# Patient Record
Sex: Female | Born: 1985
Health system: Southern US, Community
[De-identification: ages and names within clinical notes are randomized; demographics above are authoritative.]

## PROBLEM LIST (undated history)

## (undated) ENCOUNTER — Inpatient Hospital Stay (HOSPITAL_COMMUNITY): Payer: Self-pay

## (undated) DIAGNOSIS — E0590 Thyrotoxicosis, unspecified without thyrotoxic crisis or storm: Secondary | ICD-10-CM

## (undated) DIAGNOSIS — R0602 Shortness of breath: Secondary | ICD-10-CM

## (undated) DIAGNOSIS — D649 Anemia, unspecified: Secondary | ICD-10-CM

## (undated) DIAGNOSIS — G4730 Sleep apnea, unspecified: Secondary | ICD-10-CM

## (undated) DIAGNOSIS — T4145XA Adverse effect of unspecified anesthetic, initial encounter: Secondary | ICD-10-CM

## (undated) DIAGNOSIS — O139 Gestational [pregnancy-induced] hypertension without significant proteinuria, unspecified trimester: Secondary | ICD-10-CM

## (undated) DIAGNOSIS — K219 Gastro-esophageal reflux disease without esophagitis: Secondary | ICD-10-CM

## (undated) DIAGNOSIS — E079 Disorder of thyroid, unspecified: Secondary | ICD-10-CM

## (undated) DIAGNOSIS — Z9889 Other specified postprocedural states: Secondary | ICD-10-CM

## (undated) DIAGNOSIS — R519 Headache, unspecified: Secondary | ICD-10-CM

## (undated) DIAGNOSIS — T8859XA Other complications of anesthesia, initial encounter: Secondary | ICD-10-CM

## (undated) DIAGNOSIS — E559 Vitamin D deficiency, unspecified: Secondary | ICD-10-CM

## (undated) DIAGNOSIS — R51 Headache: Secondary | ICD-10-CM

## (undated) DIAGNOSIS — R112 Nausea with vomiting, unspecified: Secondary | ICD-10-CM

## (undated) DIAGNOSIS — I2699 Other pulmonary embolism without acute cor pulmonale: Secondary | ICD-10-CM

## (undated) HISTORY — DX: Sleep apnea, unspecified: G47.30

## (undated) HISTORY — DX: Shortness of breath: R06.02

## (undated) HISTORY — DX: Vitamin D deficiency, unspecified: E55.9

## (undated) HISTORY — DX: Disorder of thyroid, unspecified: E07.9

## (undated) HISTORY — PX: FRACTURE SURGERY: SHX138

---

## 2005-01-06 ENCOUNTER — Emergency Department (HOSPITAL_COMMUNITY): Admission: EM | Admit: 2005-01-06 | Discharge: 2005-01-06 | Payer: Self-pay | Admitting: Emergency Medicine

## 2005-06-01 ENCOUNTER — Other Ambulatory Visit: Admission: RE | Admit: 2005-06-01 | Discharge: 2005-06-01 | Payer: Self-pay | Admitting: Obstetrics and Gynecology

## 2005-12-12 ENCOUNTER — Emergency Department (HOSPITAL_COMMUNITY): Admission: EM | Admit: 2005-12-12 | Discharge: 2005-12-12 | Payer: Self-pay | Admitting: Emergency Medicine

## 2005-12-14 ENCOUNTER — Emergency Department (HOSPITAL_COMMUNITY): Admission: EM | Admit: 2005-12-14 | Discharge: 2005-12-15 | Payer: Self-pay | Admitting: Emergency Medicine

## 2005-12-21 ENCOUNTER — Emergency Department (HOSPITAL_COMMUNITY): Admission: EM | Admit: 2005-12-21 | Discharge: 2005-12-21 | Payer: Self-pay | Admitting: Emergency Medicine

## 2006-03-19 ENCOUNTER — Ambulatory Visit: Payer: Self-pay | Admitting: Obstetrics and Gynecology

## 2006-03-19 ENCOUNTER — Inpatient Hospital Stay (HOSPITAL_COMMUNITY): Admission: AD | Admit: 2006-03-19 | Discharge: 2006-03-19 | Payer: Self-pay | Admitting: Family Medicine

## 2006-03-20 ENCOUNTER — Ambulatory Visit (HOSPITAL_COMMUNITY): Admission: RE | Admit: 2006-03-20 | Discharge: 2006-03-20 | Payer: Self-pay | Admitting: Obstetrics & Gynecology

## 2006-05-12 ENCOUNTER — Inpatient Hospital Stay (HOSPITAL_COMMUNITY): Admission: AD | Admit: 2006-05-12 | Discharge: 2006-05-12 | Payer: Self-pay | Admitting: Family Medicine

## 2006-05-12 ENCOUNTER — Ambulatory Visit: Payer: Self-pay | Admitting: Physician Assistant

## 2006-06-20 ENCOUNTER — Inpatient Hospital Stay (HOSPITAL_COMMUNITY): Admission: AD | Admit: 2006-06-20 | Discharge: 2006-06-26 | Payer: Self-pay | Admitting: Obstetrics & Gynecology

## 2006-06-20 ENCOUNTER — Ambulatory Visit: Payer: Self-pay | Admitting: Gynecology

## 2006-06-21 ENCOUNTER — Encounter: Payer: Self-pay | Admitting: Obstetrics & Gynecology

## 2006-11-12 ENCOUNTER — Emergency Department (HOSPITAL_COMMUNITY): Admission: EM | Admit: 2006-11-12 | Discharge: 2006-11-12 | Payer: Self-pay | Admitting: Emergency Medicine

## 2006-11-30 ENCOUNTER — Emergency Department (HOSPITAL_COMMUNITY): Admission: EM | Admit: 2006-11-30 | Discharge: 2006-12-01 | Payer: Self-pay | Admitting: Emergency Medicine

## 2007-06-01 ENCOUNTER — Emergency Department (HOSPITAL_COMMUNITY): Admission: EM | Admit: 2007-06-01 | Discharge: 2007-06-01 | Payer: Self-pay | Admitting: Emergency Medicine

## 2007-07-17 ENCOUNTER — Emergency Department (HOSPITAL_COMMUNITY): Admission: EM | Admit: 2007-07-17 | Discharge: 2007-07-17 | Payer: Self-pay | Admitting: Emergency Medicine

## 2007-10-15 ENCOUNTER — Emergency Department (HOSPITAL_COMMUNITY): Admission: EM | Admit: 2007-10-15 | Discharge: 2007-10-16 | Payer: Self-pay | Admitting: Family Medicine

## 2007-12-28 ENCOUNTER — Emergency Department (HOSPITAL_COMMUNITY): Admission: EM | Admit: 2007-12-28 | Discharge: 2007-12-28 | Payer: Self-pay | Admitting: Family Medicine

## 2007-12-30 ENCOUNTER — Emergency Department (HOSPITAL_COMMUNITY): Admission: EM | Admit: 2007-12-30 | Discharge: 2007-12-30 | Payer: Self-pay | Admitting: Emergency Medicine

## 2008-02-10 ENCOUNTER — Emergency Department (HOSPITAL_COMMUNITY): Admission: EM | Admit: 2008-02-10 | Discharge: 2008-02-10 | Payer: Self-pay | Admitting: Emergency Medicine

## 2008-03-01 ENCOUNTER — Emergency Department (HOSPITAL_COMMUNITY): Admission: EM | Admit: 2008-03-01 | Discharge: 2008-03-01 | Payer: Self-pay | Admitting: Emergency Medicine

## 2008-05-02 ENCOUNTER — Emergency Department (HOSPITAL_COMMUNITY): Admission: EM | Admit: 2008-05-02 | Discharge: 2008-05-02 | Payer: Self-pay | Admitting: Emergency Medicine

## 2008-06-17 ENCOUNTER — Emergency Department (HOSPITAL_COMMUNITY): Admission: EM | Admit: 2008-06-17 | Discharge: 2008-06-17 | Payer: Self-pay | Admitting: Emergency Medicine

## 2008-07-15 ENCOUNTER — Emergency Department (HOSPITAL_COMMUNITY): Admission: EM | Admit: 2008-07-15 | Discharge: 2008-07-15 | Payer: Self-pay | Admitting: Family Medicine

## 2008-08-25 ENCOUNTER — Emergency Department (HOSPITAL_COMMUNITY): Admission: EM | Admit: 2008-08-25 | Discharge: 2008-08-25 | Payer: Self-pay | Admitting: Family Medicine

## 2008-11-10 ENCOUNTER — Emergency Department (HOSPITAL_COMMUNITY): Admission: EM | Admit: 2008-11-10 | Discharge: 2008-11-10 | Payer: Self-pay | Admitting: Family Medicine

## 2008-11-21 ENCOUNTER — Emergency Department (HOSPITAL_COMMUNITY): Admission: EM | Admit: 2008-11-21 | Discharge: 2008-11-21 | Payer: Self-pay | Admitting: Emergency Medicine

## 2009-02-14 IMAGING — US US OB FOLLOW-UP
1 series · 14 of 21 positions shown · non-contrast
Comparison: none

OBSTETRICAL ULTRASOUND:

 This ultrasound exam was performed in the [HOSPITAL] Ultrasound Department.  The OB US report was generated in the AS system, and faxed to the ordering physician.  This report is also available in [REDACTED] PACS.

[Series 1: us ob re-eval · 14 of 21 slices shown]
[im 1/21]
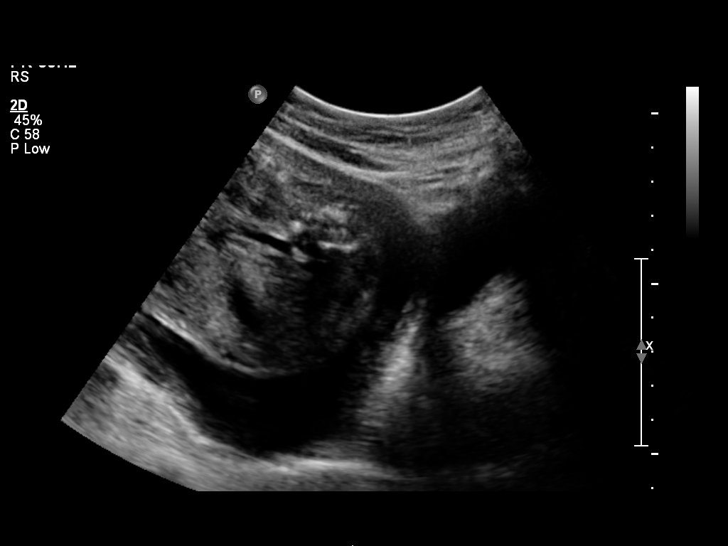
[im 3/21]
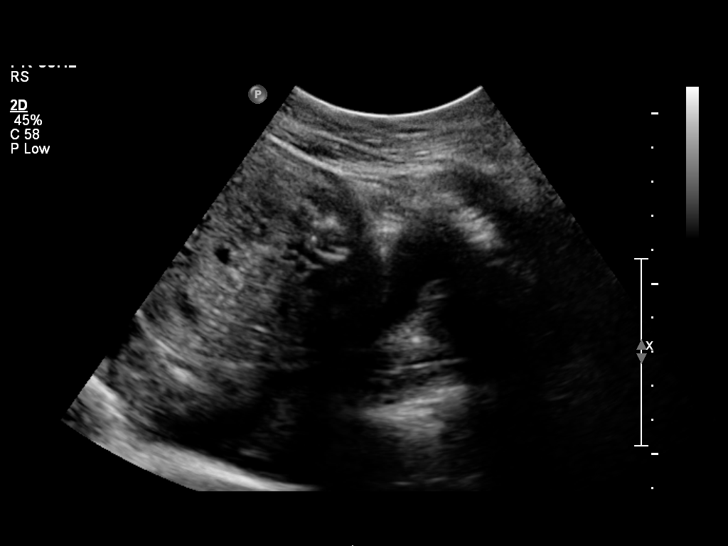
[im 4/21]
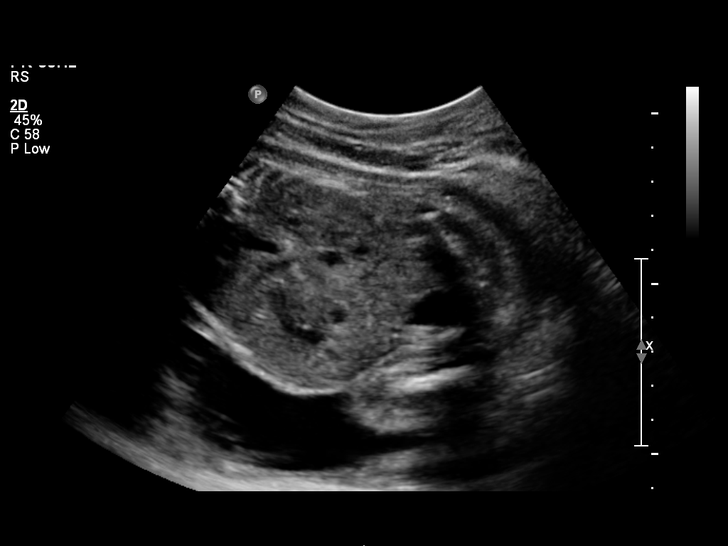
[im 6/21]
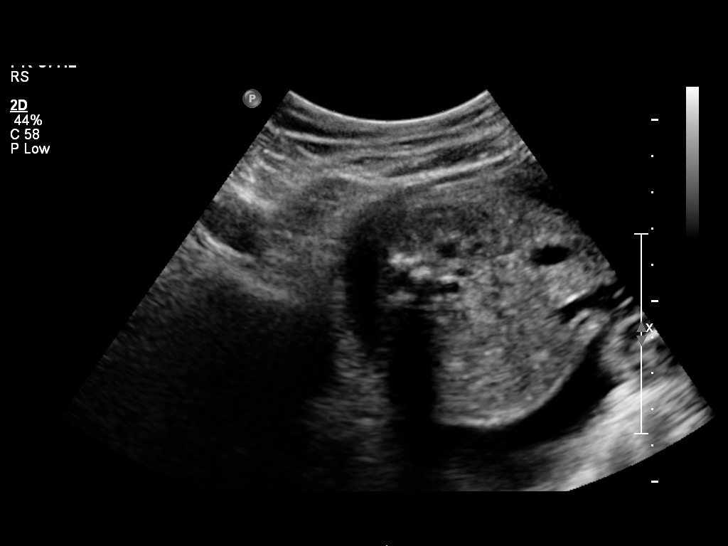
[im 7/21]
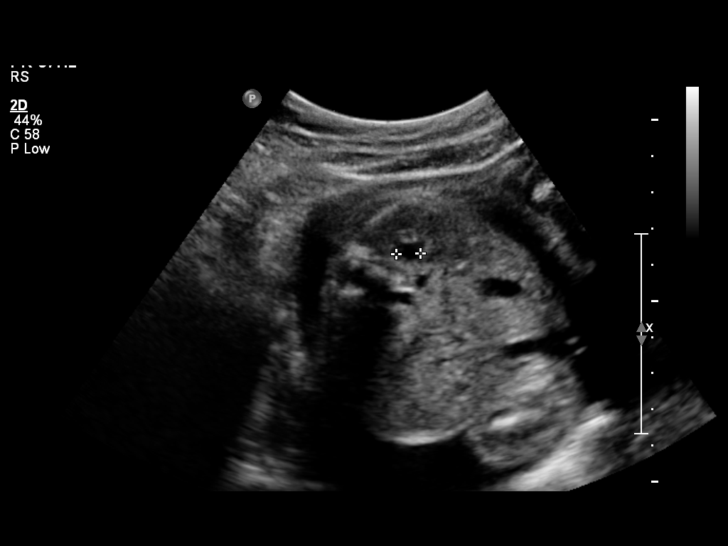
[im 9/21]
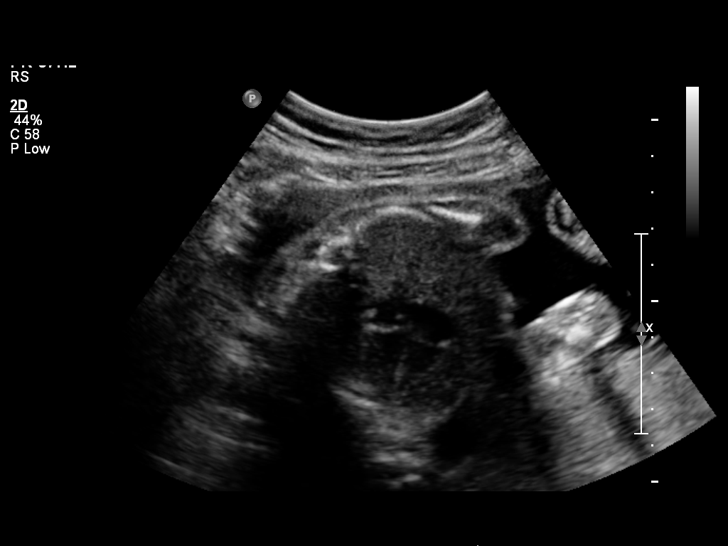
[im 10/21]
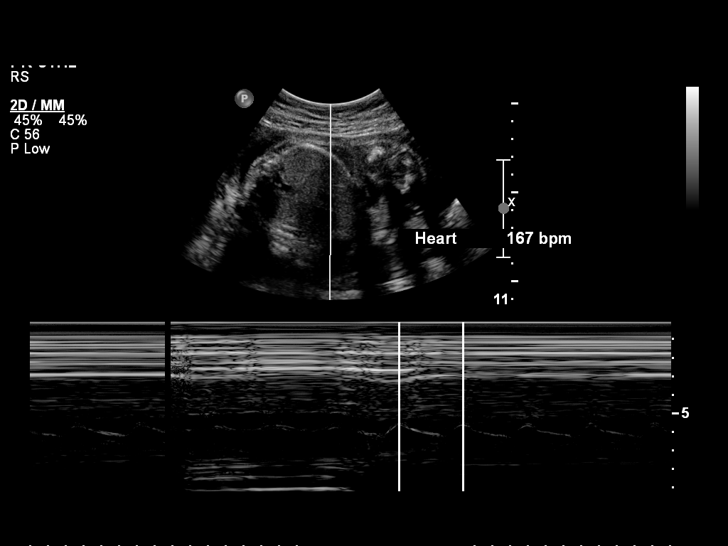
[im 12/21]
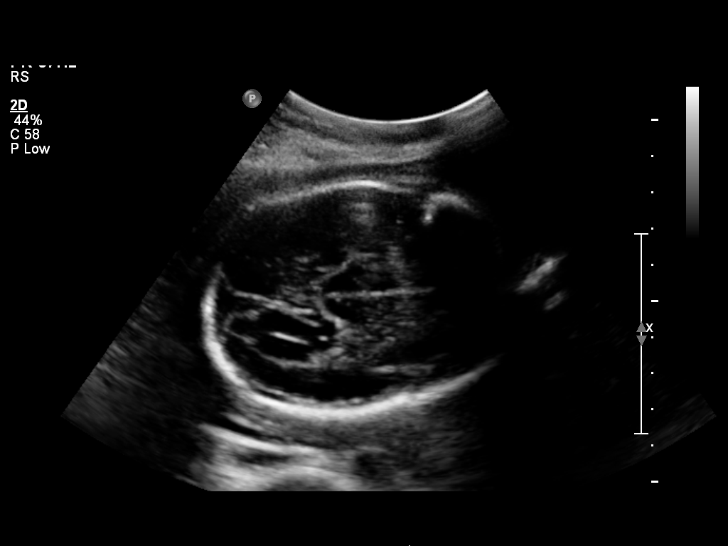
[im 13/21]
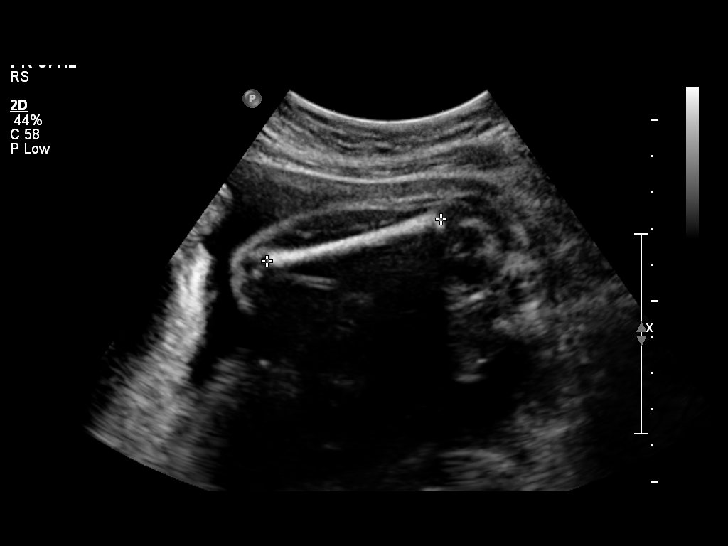
[im 15/21]
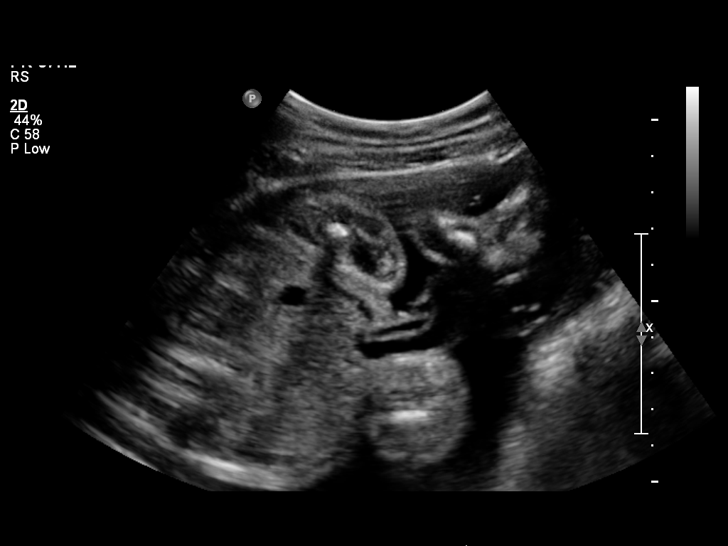
[im 16/21]
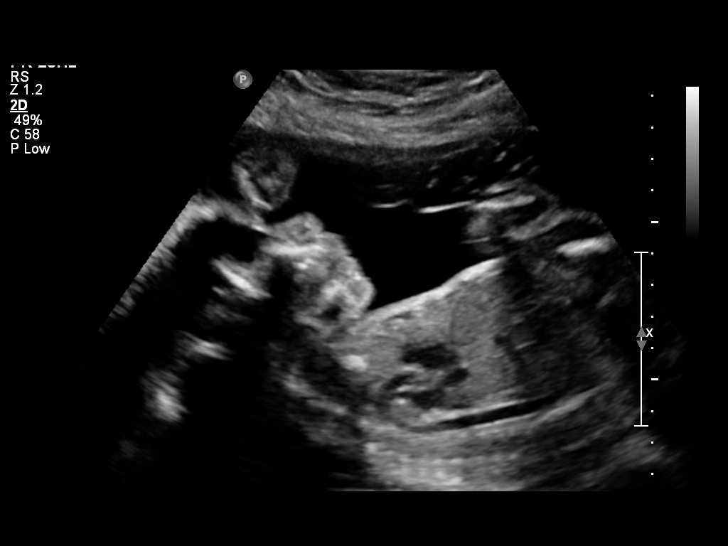
[im 18/21]
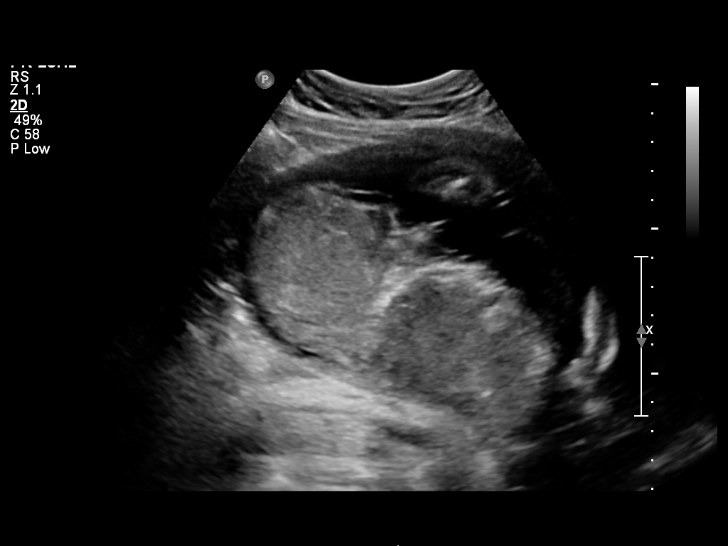
[im 19/21]
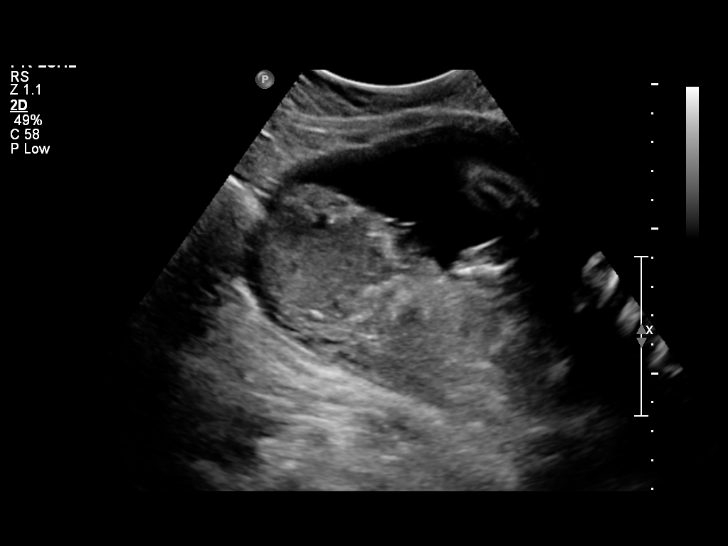
[im 21/21]
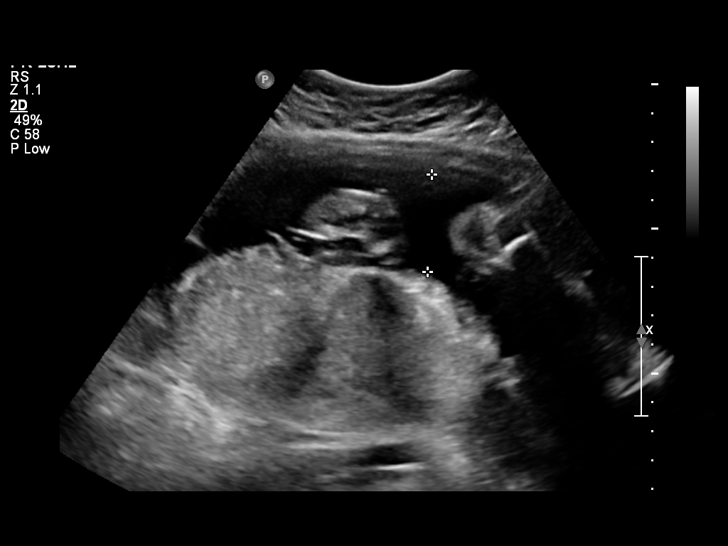

[14 of 21 positions shown; findings below may reference images not displayed]

IMPRESSION: See AS Obstetric US report.

## 2009-07-01 ENCOUNTER — Emergency Department (HOSPITAL_COMMUNITY): Admission: EM | Admit: 2009-07-01 | Discharge: 2009-07-01 | Payer: Self-pay | Admitting: Emergency Medicine

## 2009-08-25 ENCOUNTER — Emergency Department (HOSPITAL_COMMUNITY): Admission: EM | Admit: 2009-08-25 | Discharge: 2009-08-26 | Payer: Self-pay | Admitting: Emergency Medicine

## 2010-01-26 ENCOUNTER — Emergency Department (HOSPITAL_COMMUNITY)
Admission: EM | Admit: 2010-01-26 | Discharge: 2010-01-26 | Payer: Self-pay | Source: Home / Self Care | Admitting: Family Medicine

## 2010-03-11 ENCOUNTER — Emergency Department (HOSPITAL_COMMUNITY)
Admission: EM | Admit: 2010-03-11 | Discharge: 2010-03-11 | Disposition: A | Discharge status: Home / Self Care | Payer: Commercial Managed Care - PPO | Attending: Emergency Medicine | Admitting: Emergency Medicine

## 2010-03-11 ENCOUNTER — Emergency Department (HOSPITAL_COMMUNITY): Payer: Commercial Managed Care - PPO

## 2010-03-11 DIAGNOSIS — Z79899 Other long term (current) drug therapy: Secondary | ICD-10-CM | POA: Insufficient documentation

## 2010-03-11 DIAGNOSIS — J45909 Unspecified asthma, uncomplicated: Secondary | ICD-10-CM | POA: Insufficient documentation

## 2010-03-11 DIAGNOSIS — R1310 Dysphagia, unspecified: Secondary | ICD-10-CM | POA: Insufficient documentation

## 2010-03-11 DIAGNOSIS — E0590 Thyrotoxicosis, unspecified without thyrotoxic crisis or storm: Secondary | ICD-10-CM | POA: Insufficient documentation

## 2010-03-11 DIAGNOSIS — J029 Acute pharyngitis, unspecified: Secondary | ICD-10-CM | POA: Insufficient documentation

## 2010-03-11 DIAGNOSIS — R220 Localized swelling, mass and lump, head: Secondary | ICD-10-CM | POA: Insufficient documentation

## 2010-03-11 DIAGNOSIS — R6889 Other general symptoms and signs: Secondary | ICD-10-CM | POA: Insufficient documentation

## 2010-03-11 LAB — CBC
MCHC: 32.1 g/dL (ref 30.0–36.0)
Platelets: 262 10*3/uL (ref 150–400)
RDW: 14.1 % (ref 11.5–15.5)
WBC: 5.9 10*3/uL (ref 4.0–10.5)

## 2010-03-11 LAB — DIFFERENTIAL
Basophils Absolute: 0 10*3/uL (ref 0.0–0.1)
Basophils Relative: 0 % (ref 0–1)
Eosinophils Relative: 8 % — ABNORMAL HIGH (ref 0–5)
Lymphs Abs: 2.2 10*3/uL (ref 0.7–4.0)
Monocytes Absolute: 0.8 10*3/uL (ref 0.1–1.0)
Monocytes Relative: 14 % — ABNORMAL HIGH (ref 3–12)
Neutro Abs: 2.4 10*3/uL (ref 1.7–7.7)
Neutrophils Relative %: 40 % — ABNORMAL LOW (ref 43–77)

## 2010-03-11 LAB — BASIC METABOLIC PANEL
CO2: 21 mEq/L (ref 19–32)
Calcium: 8.4 mg/dL (ref 8.4–10.5)
Chloride: 105 mEq/L (ref 96–112)
Potassium: 4.3 mEq/L (ref 3.5–5.1)

## 2010-03-11 LAB — URINALYSIS, ROUTINE W REFLEX MICROSCOPIC
Bilirubin Urine: NEGATIVE
Glucose, UA: NEGATIVE mg/dL
Hgb urine dipstick: NEGATIVE
Ketones, ur: NEGATIVE mg/dL
Nitrite: NEGATIVE
pH: 5.5 (ref 5.0–8.0)

## 2010-03-11 LAB — TSH: TSH: 1.53 u[IU]/mL (ref 0.350–4.500)

## 2010-03-11 MED ORDER — IOHEXOL 300 MG/ML  SOLN
75.0000 mL | Freq: Once | INTRAMUSCULAR | Status: AC | PRN
  Administered 2010-03-11: 75 mL via INTRAVENOUS

## 2010-03-12 LAB — STREP A DNA PROBE: Group A Strep Probe: NEGATIVE

## 2010-04-09 LAB — POCT PREGNANCY, URINE: Preg Test, Ur: NEGATIVE

## 2010-04-19 LAB — DIFFERENTIAL
Basophils Relative: 1 % (ref 0–1)
Eosinophils Absolute: 0.2 10*3/uL (ref 0.0–0.7)
Monocytes Absolute: 0.8 10*3/uL (ref 0.1–1.0)
Monocytes Relative: 8 % (ref 3–12)

## 2010-04-19 LAB — CBC
Hemoglobin: 12.1 g/dL (ref 12.0–15.0)
MCHC: 33.7 g/dL (ref 30.0–36.0)
MCV: 90.4 fL (ref 78.0–100.0)
RBC: 3.98 MIL/uL (ref 3.87–5.11)

## 2010-04-19 LAB — URINALYSIS, ROUTINE W REFLEX MICROSCOPIC
Bilirubin Urine: NEGATIVE
Hgb urine dipstick: NEGATIVE
Specific Gravity, Urine: 1.008 (ref 1.005–1.030)
Urobilinogen, UA: 0.2 mg/dL (ref 0.0–1.0)
pH: 7.5 (ref 5.0–8.0)

## 2010-04-19 LAB — BASIC METABOLIC PANEL
CO2: 28 mEq/L (ref 19–32)
Chloride: 103 mEq/L (ref 96–112)
GFR calc Af Amer: >60 mL/min (ref >60)
Sodium: 137 mEq/L (ref 135–145)

## 2010-04-30 ENCOUNTER — Inpatient Hospital Stay (INDEPENDENT_AMBULATORY_CARE_PROVIDER_SITE_OTHER)
Admission: RE | Admit: 2010-04-30 | Discharge: 2010-04-30 | Disposition: A | Discharge status: Home / Self Care | Payer: Commercial Managed Care - PPO | Source: Ambulatory Visit | Attending: Family Medicine | Admitting: Family Medicine

## 2010-04-30 DIAGNOSIS — R198 Other specified symptoms and signs involving the digestive system and abdomen: Secondary | ICD-10-CM

## 2010-04-30 DIAGNOSIS — R071 Chest pain on breathing: Secondary | ICD-10-CM

## 2010-04-30 LAB — POCT URINALYSIS DIP (DEVICE)
Bilirubin Urine: NEGATIVE
Glucose, UA: NEGATIVE mg/dL
Hgb urine dipstick: NEGATIVE
Nitrite: NEGATIVE
Specific Gravity, Urine: 1.02 (ref 1.005–1.030)

## 2010-04-30 LAB — WET PREP, GENITAL
Trich, Wet Prep: NONE SEEN
Yeast Wet Prep HPF POC: NONE SEEN

## 2010-05-02 LAB — GC/CHLAMYDIA PROBE AMP, GENITAL: GC Probe Amp, Genital: NEGATIVE

## 2010-05-17 NOTE — Op Note (Signed)
NAME:  Debra Horn, Debra Horn NO.:  [id number]   MEDICAL RECORD NO.:  [id number]          PATIENT TYPE:  OUT   LOCATION:  MFM                           FACILITY:  [facility]   PHYSICIAN:  Ginger Carne, MD  DATE OF BIRTH:  14-Jun-1985   DATE OF PROCEDURE:  06/21/2006  DATE OF DISCHARGE:                               OPERATIVE REPORT   PREOPERATIVE DIAGNOSIS:  33 weeks, severe pregnancy-induced  hypertension.   POSTOPERATIVE DIAGNOSIS:  33 weeks, severe pregnancy-induced  hypertension.  Preterm viable delivery of female infant.   PROCEDURE:  Primary low transverse cesarean section.   SURGEON:  Ginger Carne, M.D.   ASSISTANT:  None.   COMPLICATIONS:  None immediate.   ESTIMATED BLOOD LOSS:  600 mL.   ANESTHESIA:  Spinal.   OPERATIVE FINDINGS:  Is a preterm infant female delivered in vertex  presentation with Apgar and weight per delivery room record, no gross  abnormalities.  Baby cried spontaneously at delivery.  Amniotic fluid  was clear.  Three-vessel cord, central insertion, complete placenta.  Uterus, tubes and ovaries showed normal decidual changes of pregnancy,  cord bloods and cord pH obtained.   OPERATIVE PROCEDURE:  The patient prepped and draped in usual fashion  and placed in the left lateral supine position.  Betadine solution used  for antiseptic and the patient was catheterized prior to procedure.  After adequate spinal analgesia a Pfannenstiel incision was made.  The  abdomen opened.  Lower uterine segment incised transversely.  Baby  delivered, cord clamped, cut and infant given to the pediatric staff  after bulb suctioning.  Placenta removed manually.  Uterus inspected.  Closure of the uterine musculature in one layers of 0 Vicryl running  interlocking suture.  Bleeding points hemostatically checked.  Blood  clots removed.  Closure of the fascia in one layers of 0 Vicryl running  suture and skin staples for the skin. Instrument and sponge count  were  correct.  The patient tolerated procedure well, returned to the post  anesthesia recovery room in excellent condition.      Ginger Carne, MD  Electronically Signed     SHB/MEDQ  D:  06/22/2006  T:  06/22/2006  Job:  [id number]

## 2010-05-17 NOTE — Discharge Summary (Signed)
NAMEMarland Kitchen  Debra Horn, Debra Horn NO.:  [id number]   MEDICAL RECORD NO.:  [id number]          PATIENT TYPE:  INP   LOCATION:  9309                          FACILITY:  [facility]   PHYSICIAN:  Ginger Carne, MD  DATE OF BIRTH:  March 30, 1985   DATE OF ADMISSION:  06/20/2006  DATE OF DISCHARGE:  06/26/2006                               DISCHARGE SUMMARY   DISCHARGE DIAGNOSES:  1. Delivery of viable female infant at 13 weeks and 4 days gestational      age via primary low transverse cesarean section for severe      preeclampsia.  2. Hypertension, suspect chronic.  3. History of hyperthyroidism.  4. History of asthma.   DISCHARGE MEDICATIONS:  1. Norvasc 5 mg p.o. daily.  2. Toprol XL 50 mg p.o. once daily.  3. Hydrochlorothiazide 25 mg p.o. once daily.  4. Motrin 600 mg 1 tablet every 6 hours as needed for pain.  5. Percocet 5/325 1-2 tablets every 4-6 hours as needed for pain.  6. Prenatal vitamins, take 1 tablet daily while breast-feeding.   FOLLOW-UP INSTRUCTIONS:  Debra Horn has an appointment scheduled for July 1  at 1:45 p.m. at the [facility] for follow-up of  her high blood pressure.  She is also to make an appointment for a 6-  week postpartum check either at the [facility] or at the  [facility].  She has agreed to purchase a blood  pressure cuff this week and monitor her blood pressure.  Signs of high  blood pressure and low blood pressure have been discussed with the  patient and she will return to maternity admissions for any of the above  symptoms.   HOSPITAL COURSE:  Problem 1.  Debra Horn is a 25 year old gravida 1, para 0-  1-0-1 who presented to [facility] with an intrauterine pregnancy at  32 weeks and 6 days gestational age.  She had not had any prenatal care  and came in with a chief complaint of chest pain as well as shortness of  breath and a frontal headache.  And her blood pressure was found to be  154/105 and then 165/121 on repeat.  Urine dipstick showed 300+ protein.  She was admitted with a diagnosis of preeclampsia and started on  magnesium sulfate as well as labetalol IV as needed for elevated blood  pressures.  Her 24-hour urine protein was 3102 confirming the diagnosis  of preeclampsia.  PIH labs were unremarkable.  Debra Horn had an ultrasound  for growth that showed growth in the 19th percentile with an AFI of 12  and a BPP of 6 out of 8.  Debra Horn received two doses of betamethasone for  lung maturity.  Maternal fetal medicine consulted on the patient  formally.  Her blood pressures remained uncontrolled with maximum doses  of medication and she was consented and taken for primary low transverse  cesarean section on the evening of June 19.  Please see operative note  for full details of this procedure.  The procedure resulted in the  delivery of a viable female infant who is  currently stable in the neonatal  intensive care unit.  Magnesium was continued for 24 hours  postoperatively.  However, her blood pressures remained elevated for  several more days.  She was initially started on labetalol 200 mg b.i.d.  This medication was then increased to 400 mg b.i.d. when her blood  pressures remained uncontrolled.  At the time of discharge her blood  pressure is moderately well controlled at 141/99 with the following  blood pressure medicines:  Toprol XL 25 mg XL once daily,  hydrochlorothiazide 25 mg once daily, and Norvasc 5 mg once daily.  Debra Horn denies any headache or visual changes.  As mentioned above she  will purchase a blood pressure monitor and go to the [facility] on July 1 for a blood pressure check and medication  adjustment as necessary.  She was breast and bottle feeding and desires  an IUD at her 6-week check for contraception.   Problem 2.  Hyperthyroidism:  The patient has a history of  hyperthyroidism and was on PTU therapy at some point.   However stopped  when she became pregnant.  Thyroid function tests were obtained and TSH  was normal at 2.104, free T4 was 1.32 and free T3 was 2.9, all within  normal limits.  She needs close follow-up of her thyroid function as an  outpatient once her pregnancy has resolved.   PROCEDURES:  Debra Horn had a CT angiogram of the chest to evaluate her  chest pain and shortness of breath.  This procedure was negative for  acute pulmonary embolism or any other acute findings.  She also had an  OB ultrasound on June 18 and 19th as well.  Ty also had an EKG  performed on the morning of June 24 that showed normal sinus rhythm with  a rate of 76 and nonspecific T-wave abnormalities.     ______________________________  Sylvan Cheese, M.D.      Ginger Carne, MD  Electronically Signed    MJ/MEDQ  D:  06/26/2006  T:  06/26/2006  Job:  [id number]

## 2010-09-30 LAB — GC/CHLAMYDIA PROBE AMP, GENITAL
Chlamydia, DNA Probe: NEGATIVE
GC Probe Amp, Genital: NEGATIVE

## 2010-09-30 LAB — POCT URINALYSIS DIP (DEVICE)
Bilirubin Urine: NEGATIVE
Ketones, ur: 15 — AB
Operator id: 247071
Protein, ur: 30 — AB
Specific Gravity, Urine: 1.02

## 2010-09-30 LAB — POCT RAPID STREP A: Streptococcus, Group A Screen (Direct): POSITIVE — AB

## 2010-09-30 LAB — WET PREP, GENITAL
Trich, Wet Prep: NONE SEEN
Yeast Wet Prep HPF POC: NONE SEEN

## 2010-10-03 LAB — T3, FREE: T3, Free: 2.5 (ref 2.3–4.2)

## 2010-10-07 LAB — POCT I-STAT, CHEM 8
BUN: 8 mg/dL (ref 6–23)
Calcium, Ion: 1.05 mmol/L — ABNORMAL LOW (ref 1.12–1.32)
Chloride: 106 mEq/L (ref 96–112)
HCT: 39 % (ref 36.0–46.0)
Sodium: 137 mEq/L (ref 135–145)

## 2010-10-07 LAB — CBC
HCT: 38.1 % (ref 36.0–46.0)
Hemoglobin: 12.4 g/dL (ref 12.0–15.0)
MCHC: 32.5 g/dL (ref 30.0–36.0)
MCV: 89.4 fL (ref 78.0–100.0)
RBC: 4.26 MIL/uL (ref 3.87–5.11)

## 2010-10-07 LAB — DIFFERENTIAL
Basophils Absolute: 0.1 10*3/uL (ref 0.0–0.1)
Basophils Relative: 1 % (ref 0–1)
Lymphocytes Relative: 16 % (ref 12–46)
Monocytes Absolute: 0.6 10*3/uL (ref 0.1–1.0)
Neutro Abs: 8.3 10*3/uL — ABNORMAL HIGH (ref 1.7–7.7)
Neutrophils Relative %: 77 % (ref 43–77)

## 2010-10-07 LAB — TSH: TSH: 0.941 u[IU]/mL (ref 0.350–4.500)

## 2010-10-11 LAB — DIFFERENTIAL
Basophils Absolute: 0.1
Lymphocytes Relative: 40
Lymphs Abs: 4.4 — ABNORMAL HIGH
Monocytes Absolute: 0.7
Neutro Abs: 4.8

## 2010-10-11 LAB — CBC
HCT: 33.8 — ABNORMAL LOW
Hemoglobin: 11.1 — ABNORMAL LOW
Platelets: 379
RDW: 16.5 — ABNORMAL HIGH
WBC: 10.8 — ABNORMAL HIGH

## 2010-10-11 LAB — I-STAT 8, (EC8 V) (CONVERTED LAB)
Acid-base deficit: 1
BUN: 19
Chloride: 104
HCT: 39
Hemoglobin: 13.3
Operator id: 272551
Potassium: 3.5

## 2010-10-11 LAB — POCT I-STAT CREATININE
Creatinine, Ser: 0.7
Operator id: 272551

## 2010-10-19 LAB — DIFFERENTIAL
Basophils Absolute: 0
Basophils Relative: 0
Eosinophils Absolute: 0
Eosinophils Absolute: 0
Eosinophils Relative: 1
Lymphocytes Relative: 14
Lymphs Abs: 2.6
Monocytes Absolute: 0 — ABNORMAL LOW
Monocytes Relative: 6
Neutrophils Relative %: 86 — ABNORMAL HIGH

## 2010-10-19 LAB — BLOOD GAS, ARTERIAL
Acid-base deficit: 3.2 — ABNORMAL HIGH
Drawn by: 136
FIO2: 0.21

## 2010-10-19 LAB — CBC
HCT: 35.5 — ABNORMAL LOW
Hemoglobin: 10.6 — ABNORMAL LOW
Hemoglobin: 11.8 — ABNORMAL LOW
Hemoglobin: 12.4
MCHC: 33.2
MCHC: 33.3
MCV: 89.6
Platelets: 273
RBC: 3.97
RBC: 4.22
RDW: 15.1 — ABNORMAL HIGH
RDW: 15.3 — ABNORMAL HIGH
WBC: 7.6

## 2010-10-19 LAB — COMPREHENSIVE METABOLIC PANEL
ALT: 11
ALT: 8
AST: 19
Alkaline Phosphatase: 110
BUN: 5 — ABNORMAL LOW
CO2: 20
CO2: 24
Calcium: 8.6
GFR calc Af Amer: >60
GFR calc non Af Amer: >60
GFR calc non Af Amer: >60
Glucose, Bld: 139 — ABNORMAL HIGH
Potassium: 3.7
Potassium: 4
Sodium: 132 — ABNORMAL LOW
Sodium: 139
Total Protein: 5.8 — ABNORMAL LOW
Total Protein: 5.9 — ABNORMAL LOW

## 2010-10-19 LAB — BASIC METABOLIC PANEL
Calcium: 9
GFR calc non Af Amer: >60
Glucose, Bld: 90
Potassium: 4.2
Sodium: 138

## 2010-10-19 LAB — LACTATE DEHYDROGENASE: LDH: 202

## 2010-10-19 LAB — TROPONIN I: Troponin I: 0.02

## 2010-10-19 LAB — URINE MICROSCOPIC-ADD ON

## 2010-10-19 LAB — URIC ACID: Uric Acid, Serum: 6.5

## 2010-10-19 LAB — URINALYSIS, ROUTINE W REFLEX MICROSCOPIC
Glucose, UA: NEGATIVE
Leukocytes, UA: NEGATIVE
Protein, ur: >300 — AB
Specific Gravity, Urine: 1.025
pH: 6.5

## 2010-10-19 LAB — TYPE AND SCREEN
ABO/RH(D): B POS
Antibody Screen: NEGATIVE

## 2010-10-19 LAB — WET PREP, GENITAL

## 2010-10-19 LAB — STREP B DNA PROBE: Strep Group B Ag: POSITIVE

## 2010-10-19 LAB — CREATININE CLEARANCE, URINE, 24 HOUR
Collection Interval-CRCL: 24
Creatinine Clearance: 66 — ABNORMAL LOW
Creatinine, 24H Ur: 624 — ABNORMAL LOW
Creatinine, Urine: 56.7
Creatinine: 0.66

## 2010-10-19 LAB — RAPID URINE DRUG SCREEN, HOSP PERFORMED: Benzodiazepines: NOT DETECTED

## 2010-10-19 LAB — MAGNESIUM: Magnesium: 5.8 — ABNORMAL HIGH

## 2010-10-19 LAB — CK TOTAL AND CKMB (NOT AT ARMC): Relative Index: INVALID

## 2010-10-19 LAB — RUBELLA SCREEN: Rubella: 48.3 — ABNORMAL HIGH

## 2010-10-19 LAB — PLATELET COUNT: Platelets: 304

## 2010-10-19 LAB — GLUCOSE TOLERANCE, 1 HOUR: Glucose, 1 Hour GTT: 90

## 2010-12-28 ENCOUNTER — Emergency Department (HOSPITAL_COMMUNITY)
Admission: EM | Admit: 2010-12-28 | Discharge: 2010-12-28 | Disposition: A | Discharge status: Home / Self Care | Payer: Commercial Managed Care - PPO | Attending: Emergency Medicine | Admitting: Emergency Medicine

## 2010-12-28 DIAGNOSIS — J029 Acute pharyngitis, unspecified: Secondary | ICD-10-CM

## 2010-12-28 DIAGNOSIS — R220 Localized swelling, mass and lump, head: Secondary | ICD-10-CM | POA: Insufficient documentation

## 2010-12-28 DIAGNOSIS — E0590 Thyrotoxicosis, unspecified without thyrotoxic crisis or storm: Secondary | ICD-10-CM | POA: Insufficient documentation

## 2010-12-28 DIAGNOSIS — R0602 Shortness of breath: Secondary | ICD-10-CM | POA: Insufficient documentation

## 2010-12-28 HISTORY — DX: Thyrotoxicosis, unspecified without thyrotoxic crisis or storm: E05.90

## 2010-12-28 LAB — DIFFERENTIAL
Basophils Relative: 0 % (ref 0–1)
Eosinophils Absolute: 0.4 10*3/uL (ref 0.0–0.7)
Lymphs Abs: 4.9 10*3/uL — ABNORMAL HIGH (ref 0.7–4.0)
Neutro Abs: 4.1 10*3/uL (ref 1.7–7.7)
Neutrophils Relative %: 41 % — ABNORMAL LOW (ref 43–77)

## 2010-12-28 LAB — POCT I-STAT, CHEM 8
Chloride: 106 mEq/L (ref 96–112)
HCT: 37 % (ref 36.0–46.0)
Hemoglobin: 12.6 g/dL (ref 12.0–15.0)
Potassium: 4 mEq/L (ref 3.5–5.1)
Sodium: 141 mEq/L (ref 135–145)

## 2010-12-28 LAB — CBC
MCH: 28.4 pg (ref 26.0–34.0)
MCHC: 32.1 g/dL (ref 30.0–36.0)
Platelets: 381 10*3/uL (ref 150–400)
RBC: 3.94 MIL/uL (ref 3.87–5.11)

## 2010-12-28 LAB — RAPID STREP SCREEN (MED CTR MEBANE ONLY): Streptococcus, Group A Screen (Direct): NEGATIVE

## 2010-12-28 MED ORDER — DEXAMETHASONE 6 MG PO TABS
10.0000 mg | ORAL_TABLET | ORAL | Status: AC
  Administered 2010-12-28: 10 mg via ORAL
  Filled 2010-12-28: qty 1

## 2010-12-28 MED ORDER — HYDROCODONE-HOMATROPINE 5-1.5 MG/5ML PO SYRP: 5.0000 mL | ORAL_SOLUTION | Freq: Four times a day (QID) | ORAL | Status: AC | PRN

## 2010-12-28 NOTE — ED Provider Notes (Signed)
7:23 AM Patient care resumed from Earley Favor.  Her plan is to discharge patient after receiving Decadron and reevaluation.  Patient states she is feeling much better since the steroids kicked in.  She is able to speak full sentences, swallows secretions, has no stridor or wheezing, and her voice is no longer muffled.  Patient given a glass of water and is able to by mouth fluid hydrate.  She is currently hemodynamically stable, in NAD and has no complaints.  Patient will be discharged with instructions to followup with primary care physician.    [place], [place] 12/28/10 [phone]

## 2010-12-28 NOTE — ED Provider Notes (Signed)
Medical screening examination/treatment/procedure(s) were performed by non-physician practitioner and as supervising physician I was immediately available for consultation/collaboration.   Nat Christen, MD 12/28/10 [phone]

## 2010-12-28 NOTE — ED Provider Notes (Signed)
Medical screening examination/treatment/procedure(s) were performed by non-physician practitioner and as supervising physician I was immediately available for consultation/collaboration.   Dewarren Ledbetter A Tysheem Accardo, MD 12/28/10 1602 

## 2010-12-28 NOTE — ED Provider Notes (Signed)
History     CSN: [id number]  Arrival date & time 12/28/10  0414   First MD Initiated Contact with Patient 12/28/10 0455      Chief Complaint  Patient presents with  . Shortness of Breath    (Consider location/radiation/quality/duration/timing/severity/associated sxs/prior treatment) HPI Comments: This patient states around midnight she noticed a tight feeling in her throat feels swollen although she can't swallow her own saliva and drink fluids.  She took 75 mg of Benadryl in tablet form, thinking this was potential allergic reaction to "something" the symptoms have not changed.  She has never had this before, denies fever, being in contact with anyone with known strep throat.  Children and husband are well  Patient is a 25 y.o. female presenting with shortness of breath. The history is provided by the patient.  Shortness of Breath  The current episode started today. The problem occurs continuously. The problem has been gradually worsening. The problem is moderate. The symptoms are relieved by nothing. Associated symptoms include shortness of breath. Pertinent negatives include no fever, no rhinorrhea, no sore throat, no stridor and no cough. She was not exposed to toxic fumes. She has not inhaled smoke recently. She has had no prior steroid use. She has had no prior hospitalizations. Her past medical history does not include asthma, bronchiolitis or past wheezing.    Past Medical History  Diagnosis Date  . Hyperthyroidism     No past surgical history on file.  No family history on file.  History  Substance Use Topics  . Smoking status: Not on file  . Smokeless tobacco: Not on file  . Alcohol Use:     OB History    Grav Para Term Preterm Abortions TAB SAB Ect Mult Living                  Review of Systems  Constitutional: Negative for fever.  HENT: Positive for voice change. Negative for sore throat, rhinorrhea, sneezing, drooling and trouble swallowing.   Respiratory:  Positive for shortness of breath. Negative for cough and stridor.   Genitourinary: Negative.   Neurological: Negative for dizziness and weakness.  Hematological: Negative.   Psychiatric/Behavioral: Negative.     Allergies  Suprax  Home Medications   Current Outpatient Rx  Name Route Sig Dispense Refill  . BENADRYL ALLERGY PO Oral Take 1 tablet by mouth every 6 (six) hours as needed. For itching      BP 121/71  Pulse 89  Temp(Src) 97.6 F (36.4 C) (Oral)  Resp 18  SpO2 99%  LMP 11/28/2010  Physical Exam  Constitutional: She is oriented to person, place, and time. She appears well-developed.  HENT:  Right Ear: External ear normal.  Left Ear: External ear normal.  Mouth/Throat: Mucous membranes are normal. Posterior oropharyngeal edema present. No oropharyngeal exudate, posterior oropharyngeal erythema or tonsillar abscesses.  Neck: Normal range of motion.  Cardiovascular: Normal rate.   Pulmonary/Chest: Effort normal.  Musculoskeletal: Normal range of motion.  Neurological: She is alert and oriented to person, place, and time.  Skin: Skin is warm and dry.  Psychiatric: She has a normal mood and affect.    ED Course  Procedures (including critical care time)   Labs Reviewed  RAPID STREP SCREEN  CBC  DIFFERENTIAL  I-STAT, CHEM 8   No results found.   No diagnosis found.    MDM  Will obtain strep test, CBC i-STAT, hydration and by mouth Decadron, and reassess in 30 minutes.  Doubt this  is allergic reaction as patient has early taken Benadryl without change in symptoms.  In considering strep, Ludwig angina, retropharyngeal abscess        Arman Filter, NP 12/28/10 [phone]

## 2010-12-28 NOTE — ED Notes (Signed)
Pt complains of sob, sts woke up and felt like she was choking in her sleep

## 2011-02-02 ENCOUNTER — Encounter (HOSPITAL_COMMUNITY): Payer: Self-pay | Admitting: Emergency Medicine

## 2011-02-02 ENCOUNTER — Emergency Department (HOSPITAL_COMMUNITY)
Admission: EM | Admit: 2011-02-02 | Discharge: 2011-02-02 | Disposition: A | Discharge status: Home / Self Care | Payer: Commercial Managed Care - PPO | Source: Home / Self Care | Attending: Emergency Medicine | Admitting: Emergency Medicine

## 2011-02-02 DIAGNOSIS — J069 Acute upper respiratory infection, unspecified: Secondary | ICD-10-CM

## 2011-02-02 DIAGNOSIS — J45909 Unspecified asthma, uncomplicated: Secondary | ICD-10-CM

## 2011-02-02 DIAGNOSIS — R0789 Other chest pain: Secondary | ICD-10-CM

## 2011-02-02 MED ORDER — PREDNISONE 20 MG PO TABS: 40.0000 mg | ORAL_TABLET | Freq: Every day | ORAL | Status: AC

## 2011-02-02 MED ORDER — PREDNISONE 20 MG PO TABS: 40.0000 mg | ORAL_TABLET | Freq: Every day | ORAL | Status: DC

## 2011-02-02 MED ORDER — ALBUTEROL SULFATE HFA 108 (90 BASE) MCG/ACT IN AERS: 2.0000 | INHALATION_SPRAY | RESPIRATORY_TRACT | Status: DC | PRN

## 2011-02-02 MED ORDER — ALBUTEROL SULFATE HFA 108 (90 BASE) MCG/ACT IN AERS
INHALATION_SPRAY | RESPIRATORY_TRACT | Status: AC
  Filled 2011-02-02: qty 6.7

## 2011-02-02 MED ORDER — ALBUTEROL SULFATE HFA 108 (90 BASE) MCG/ACT IN AERS: 2.0000 | INHALATION_SPRAY | Freq: Four times a day (QID) | RESPIRATORY_TRACT | Status: DC

## 2011-02-02 NOTE — ED Notes (Signed)
C/o bad cough, "can't breathe, but different than asthma type "can't breathe" .  With breathing in, mid left back pain.

## 2011-02-02 NOTE — ED Provider Notes (Signed)
History     CSN: [id number]  Arrival date & time 02/02/11  1639   First MD Initiated Contact with Patient 02/02/11 1720      Chief Complaint  Patient presents with  . Cough    (Consider location/radiation/quality/duration/timing/severity/associated sxs/prior treatment) HPI Comments: Coughing and feeling chest tightness, coughing hard makes my upper back hurt"  No fevers  Patient is a 26 y.o. female presenting with cough. The history is provided by the patient.  Cough This is a new problem. The current episode started more than 2 days ago. The problem occurs constantly. The problem has been gradually worsening. The cough is non-productive. There has been no fever. Associated symptoms include shortness of breath and wheezing. Pertinent negatives include no headaches and no rhinorrhea.    Past Medical History  Diagnosis Date  . Hyperthyroidism   . Asthma     Past Surgical History  Procedure Date  . Cesarean section     History reviewed. No pertinent family history.  History  Substance Use Topics  . Smoking status: Never Smoker   . Smokeless tobacco: Not on file  . Alcohol Use: No    OB History    Grav Para Term Preterm Abortions TAB SAB Ect Mult Living                  Review of Systems  Constitutional: Negative for fever, diaphoresis and fatigue.  HENT: Negative for rhinorrhea.   Respiratory: Positive for cough, chest tightness, shortness of breath and wheezing.   Neurological: Negative for headaches.    Allergies  Suprax  Home Medications   Current Outpatient Rx  Name Route Sig Dispense Refill  . ALBUTEROL SULFATE HFA IN Inhalation Inhale into the lungs.    . ALBUTEROL SULFATE HFA 108 (90 BASE) MCG/ACT IN AERS Inhalation Inhale 2 puffs into the lungs every 4 (four) hours as needed for wheezing. 1 g 0  . BENADRYL ALLERGY PO Oral Take 1 tablet by mouth every 6 (six) hours as needed. For itching    . PREDNISONE 20 MG PO TABS Oral Take 2 tablets (40 mg  total) by mouth daily. 2 tablets daily for 5 days 10 tablet 0    BP 135/78  Pulse 72  Temp(Src) 99.1 F (37.3 C) (Oral)  Resp 18  SpO2 99%  LMP 12/29/2009  Physical Exam  Nursing note and vitals reviewed. Constitutional: She appears well-developed and well-nourished. No distress.  HENT:  Head: Normocephalic.  Eyes: Conjunctivae are normal.  Neck: Normal range of motion. No JVD present.  Cardiovascular: Normal rate and normal heart sounds.  Exam reveals no gallop and no friction rub.   No murmur heard. Pulmonary/Chest: Effort normal and breath sounds normal. No respiratory distress. She has no decreased breath sounds. She has no wheezes. She has no rales. She exhibits no tenderness.  Lymphadenopathy:    She has no cervical adenopathy.  Skin: Skin is warm.    ED Course  Procedures (including critical care time)  Labs Reviewed - No data to display No results found.   1. URI, acute   2. Reactive airway disease   3. Chest tightness       MDM  Cough with RAD, AFEBRILE        Jimmie Molly, MD 02/02/11 2313

## 2011-04-20 ENCOUNTER — Encounter (HOSPITAL_COMMUNITY): Payer: Self-pay | Admitting: Emergency Medicine

## 2011-04-20 ENCOUNTER — Emergency Department (HOSPITAL_COMMUNITY)
Admission: EM | Admit: 2011-04-20 | Discharge: 2011-04-20 | Disposition: A | Discharge status: Home / Self Care | Payer: Commercial Managed Care - PPO | Attending: Emergency Medicine | Admitting: Emergency Medicine

## 2011-04-20 DIAGNOSIS — N63 Unspecified lump in unspecified breast: Secondary | ICD-10-CM | POA: Insufficient documentation

## 2011-04-20 DIAGNOSIS — E0590 Thyrotoxicosis, unspecified without thyrotoxic crisis or storm: Secondary | ICD-10-CM | POA: Insufficient documentation

## 2011-04-20 DIAGNOSIS — N939 Abnormal uterine and vaginal bleeding, unspecified: Secondary | ICD-10-CM

## 2011-04-20 DIAGNOSIS — N949 Unspecified condition associated with female genital organs and menstrual cycle: Secondary | ICD-10-CM | POA: Insufficient documentation

## 2011-04-20 DIAGNOSIS — N898 Other specified noninflammatory disorders of vagina: Secondary | ICD-10-CM | POA: Insufficient documentation

## 2011-04-20 DIAGNOSIS — J45909 Unspecified asthma, uncomplicated: Secondary | ICD-10-CM | POA: Insufficient documentation

## 2011-04-20 DIAGNOSIS — N938 Other specified abnormal uterine and vaginal bleeding: Secondary | ICD-10-CM | POA: Insufficient documentation

## 2011-04-20 DIAGNOSIS — R079 Chest pain, unspecified: Secondary | ICD-10-CM | POA: Insufficient documentation

## 2011-04-20 DIAGNOSIS — N644 Mastodynia: Secondary | ICD-10-CM | POA: Insufficient documentation

## 2011-04-20 DIAGNOSIS — R109 Unspecified abdominal pain: Secondary | ICD-10-CM | POA: Insufficient documentation

## 2011-04-20 LAB — URINALYSIS, ROUTINE W REFLEX MICROSCOPIC
Glucose, UA: NEGATIVE mg/dL
Ketones, ur: NEGATIVE mg/dL
Nitrite: NEGATIVE
Protein, ur: 30 mg/dL — AB
Urobilinogen, UA: 1 mg/dL (ref 0.0–1.0)

## 2011-04-20 LAB — URINE MICROSCOPIC-ADD ON

## 2011-04-20 LAB — WET PREP, GENITAL: Yeast Wet Prep HPF POC: NONE SEEN

## 2011-04-20 MED ORDER — OXYCODONE-ACETAMINOPHEN 5-325 MG PO TABS
2.0000 | ORAL_TABLET | Freq: Once | ORAL | Status: AC
  Administered 2011-04-20: 2 via ORAL
  Filled 2011-04-20: qty 2

## 2011-04-20 MED ORDER — MEDROXYPROGESTERONE ACETATE 5 MG PO TABS: 10.0000 mg | ORAL_TABLET | Freq: Every day | ORAL | Status: DC

## 2011-04-20 MED ORDER — OXYCODONE-ACETAMINOPHEN 5-325 MG PO TABS: 1.0000 | ORAL_TABLET | ORAL | Status: AC | PRN

## 2011-04-20 NOTE — ED Provider Notes (Signed)
History     CSN: [id number]  Arrival date & time 04/20/11  1540   First MD Initiated Contact with Patient 04/20/11 1651      Chief Complaint  Patient presents with  . Vaginal Bleeding    and pain   Location-pelvic area/radiation-none/quality-dull/duration-today/timing-constant/severity-moderate/No associated sxs/No prior treatment) Patient is a 26 y.o. female presenting with vaginal bleeding. The history is provided by the patient. No language interpreter was used.  Vaginal Bleeding This is a new problem. The current episode started today. The problem occurs 2 to 4 times per day. The problem has been unchanged. Associated symptoms include abdominal pain. Pertinent negatives include no anorexia, change in bowel habit, chest pain, chills, congestion, coughing, diaphoresis, fatigue, fever, headaches, joint swelling, nausea, neck pain, numbness, rash, urinary symptoms, vertigo, vomiting or weakness. The symptoms are aggravated by nothing. She has tried nothing for the symptoms.    Past Medical History  Diagnosis Date  . Asthma   . Preeclampsia   . Hyperthyroidism     Past Surgical History  Procedure Date  . Cesarean section     History reviewed. No pertinent family history.  History  Substance Use Topics  . Smoking status: Never Smoker   . Smokeless tobacco: Not on file  . Alcohol Use: No    OB History    Grav Para Term Preterm Abortions TAB SAB Ect Mult Living                  Review of Systems  Constitutional: Negative for fever, chills, diaphoresis and fatigue.  HENT: Negative for congestion, trouble swallowing, neck pain and neck stiffness.   Eyes: Negative for pain, discharge and itching.  Respiratory: Negative for cough, chest tightness and shortness of breath.   Cardiovascular: Negative for chest pain, palpitations and leg swelling.  Gastrointestinal: Positive for abdominal pain. Negative for nausea, vomiting, diarrhea, constipation, blood in stool, anorexia and  change in bowel habit.  Genitourinary: Positive for vaginal bleeding, vaginal pain, menstrual problem and pelvic pain. Negative for dysuria, urgency, frequency, hematuria, flank pain, decreased urine volume and difficulty urinating.  Musculoskeletal: Negative for back pain and joint swelling.  Skin: Negative for rash and wound.  Neurological: Negative for dizziness, vertigo, tremors, seizures, syncope, facial asymmetry, speech difficulty, weakness, light-headedness, numbness and headaches.  Hematological: Negative for adenopathy. Does not bruise/bleed easily.  Psychiatric/Behavioral: Negative for confusion and decreased concentration.    Allergies  Suprax  Home Medications   Current Outpatient Rx  Name Route Sig Dispense Refill  . ALBUTEROL SULFATE HFA 108 (90 BASE) MCG/ACT IN AERS Inhalation Inhale 2 puffs into the lungs every 6 (six) hours as needed. For wheezing    . ASPIRIN 325 MG PO TABS Oral Take 650 mg by mouth daily as needed. For pain    . DIPHENHYDRAMINE HCL 25 MG PO TABS Oral Take 75 mg by mouth daily as needed. For allergy symptoms      BP 82/58  Pulse 102  Temp(Src) 98.2 F (36.8 C) (Oral)  Resp 20  SpO2 97%  LMP 04/19/2011  Physical Exam  Constitutional: She is oriented to person, place, and time. She appears well-developed and well-nourished. No distress.  HENT:  Head: Normocephalic and atraumatic.  Eyes: Conjunctivae are normal. Right eye exhibits no discharge. Left eye exhibits no discharge. No scleral icterus.  Neck: Normal range of motion. Neck supple.  Cardiovascular: Normal rate, regular rhythm, normal heart sounds and intact distal pulses.   No murmur heard. Pulmonary/Chest: Effort normal and breath  sounds normal. No respiratory distress. She has no wheezes. She has no rales. She exhibits no bony tenderness. Right breast exhibits tenderness. Right breast exhibits no inverted nipple, no mass, no nipple discharge and no skin change. Left breast exhibits mass  and tenderness. Left breast exhibits no inverted nipple, no nipple discharge and no skin change. Breasts are symmetrical.         Small lump noted over circled area on diagram. Mildly tender.   Abdominal: Soft. Bowel sounds are normal. She exhibits no distension and no mass. There is no tenderness. There is no rebound and no guarding. Hernia confirmed negative in the right inguinal area and confirmed negative in the left inguinal area.  Genitourinary: There is breast tenderness. No breast swelling, discharge or bleeding. Pelvic exam was performed with patient supine. There is no rash, tenderness, lesion or injury on the right labia. There is no rash, tenderness, lesion or injury on the left labia. There is bleeding around the vagina. No erythema or tenderness around the vagina. No foreign body around the vagina. No signs of injury around the vagina. No vaginal discharge found.       Chaperone present at all times.   Musculoskeletal: Normal range of motion. She exhibits no edema and no tenderness.  Lymphadenopathy:       Right: No inguinal adenopathy present.       Left: No inguinal adenopathy present.  Neurological: She is alert and oriented to person, place, and time.  Skin: Skin is warm and dry. No rash noted. She is not diaphoretic. No pallor.  Psychiatric: She has a normal mood and affect.    ED Course  Procedures (including critical care time)  Labs Reviewed  URINALYSIS, ROUTINE W REFLEX MICROSCOPIC - Abnormal; Notable for the following:    Color, Urine RED (*) BIOCHEMICALS MAY BE AFFECTED BY COLOR   APPearance CLOUDY (*)    pH 8.5 (*)    Hgb urine dipstick LARGE (*)    Protein, ur 30 (*)    Leukocytes, UA SMALL (*)    All other components within normal limits  URINE MICROSCOPIC-ADD ON - Abnormal; Notable for the following:    Squamous Epithelial / LPF MANY (*)    All other components within normal limits  WET PREP, GENITAL  POCT PREGNANCY, URINE  GC/CHLAMYDIA PROBE AMP, GENITAL   URINE CULTURE   No results found.   1. Vagina bleeding   2. Dysfunctional uterine bleeding     MDM  Pt is a well appearing 25yo F with recent irregular periods who presents with heavy VB and pelvic pain today. BP and pulse noted 130/84 and 80 in exam room. I do not think the patients triage vital signs are accurate. No abd pain. Pelvic exam shows bleeding but no clots and no tenderness. Labs pending. Oral pain meds given for discomfort.   Sx improved. Labs unremarkable. UA contaminated but UTI not felt to be present given lack of urinary sx. Plan to prescribe OCP and pain meds. Pt has appt to f/u with GYN next week. D/C home in stable and improved condition.         Consuello Masse, MD 04/20/11 [phone]

## 2011-04-20 NOTE — ED Notes (Signed)
Per pt, pt having severe pain in vaginal area and in anus starting today; pt started period yesterday and report has been heavy, states she has gone through 3 pads since waking up at 14:00; pt reports having clots in period as well; pt reports that last period prior to this one was December 28th and ended on 3rd of January; denies being on any birth control or contraceptives; states she is sexually active, but has taken pregnancy tests at home that have been negative; pt a&Ox4, resp e/u, NAD

## 2011-04-20 NOTE — Discharge Instructions (Signed)
Abnormal Uterine Bleeding Abnormal uterine bleeding can have many causes. Some cases are simply treated, while others are more serious. There are several kinds of bleeding that is considered abnormal, including:  Bleeding between periods.   Bleeding after sexual intercourse.   Spotting anytime in the menstrual cycle.   Bleeding heavier or more than normal.   Bleeding after menopause.  CAUSES  There are many causes of abnormal uterine bleeding. It can be present in teenagers, pregnant women, women during their reproductive years, and women who have reached menopause. Your caregiver will look for the more common causes depending on your age, signs, symptoms and your particular circumstance. Most cases are not serious and can be treated. Even the more serious causes, like cancer of the female organs, can be treated adequately if found in the early stages. That is why all types of bleeding should be evaluated and treated as soon as possible. DIAGNOSIS  Diagnosing the cause may take several kinds of tests. Your caregiver may:  Take a complete history of the type of bleeding.   Perform a complete physical exam and Pap smear.   Take an ultrasound on the abdomen showing a picture of the female organs and the pelvis.   Inject dye into the uterus and Fallopian tubes and X-ray them (hysterosalpingogram).   Place fluid in the uterus and do an ultrasound (sonohysterogrqphy).   Take a CT scan to examine the female organs and pelvis.   Take an MRI to examine the female organs and pelvis. There is no X-ray involved with this procedure.   Look inside the uterus with a telescope that has a light at the end (hysteroscopy).   Scrap the inside of the uterus to get tissue to examine (Dilatation and Curettage, D&C).   Look into the pelvis with a telescope that has a light at the end (laparoscopy). This is done through a very small cut (incision) in the abdomen.  TREATMENT  Treatment will depend on the  cause of the abnormal bleeding. It can include:  Doing nothing to allow the problem to take care of itself over time.   Hormone treatment.   Birth control pills.   Treating the medical condition causing the problem.   Laparoscopy.   Major or minor surgery   Destroying the lining of the uterus with electrical currant, laser, freezing or heat (uterine ablation).  HOME CARE INSTRUCTIONS   Follow your caregiver's recommendation on how to treat your problem.   See your caregiver if you missed a menstrual period and think you may be pregnant.   If you are bleeding heavily, count the number of pads/tampons you use and how often you have to change them. Tell this to your caregiver.   Avoid sexual intercourse until the problem is controlled.  SEEK MEDICAL CARE IF:   You have any kind of abnormal bleeding mentioned above.   You feel dizzy at times.   You are 26 years old and have not had a menstrual period yet.  SEEK IMMEDIATE MEDICAL CARE IF:   You pass out.   You are changing pads/tampons every 15 to 30 minutes.   You have belly (abdominal) pain.   You have a temperature of 100 F (37.8 C) or higher.   You become sweaty or weak.   You are passing large blood clots from the vagina.   You start to feel sick to your stomach (nauseous) and throw up (vomit).  Document Released: 12/19/2004 Document Revised: 12/08/2010 Document Reviewed: 05/14/2008 ExitCare   Patient Information 2012 [employer].Menorrhagia Dysfunctional uterine bleeding is different from a normal menstrual period. When periods are heavy or there is more bleeding than is usual for you, it is called menorrhagia. It may be caused by hormonal imbalance, or physical, metabolic, or other problems. Examination is necessary in order that your caregiver may treat treatable causes. If this is a continuing problem, a D&C may be needed. That means that the cervix (the opening of the uterus or womb) is dilated (stretched  larger) and the lining of the uterus is scraped out. The tissue scraped out is then examined under a microscope by a specialist (pathologist) to make sure there is nothing of concern that needs further or more extensive treatment. HOME CARE INSTRUCTIONS   If medications were prescribed, take exactly as directed. Do not change or switch medications without consulting your caregiver.   Long term heavy bleeding may result in iron deficiency. Your caregiver may have prescribed iron pills. They help replace the iron your body lost from heavy bleeding. Take exactly as directed. Iron may cause constipation. If this becomes a problem, increase the bran, fruits, and roughage in your diet.   Do not take aspirin or medicines that contain aspirin one week before or during your menstrual period. Aspirin may make the bleeding worse.   If you need to change your sanitary pad or tampon more than once every 2 hours, stay in bed and rest as much as possible until the bleeding stops.   Eat well-balanced meals. Eat foods high in iron. Examples are leafy green vegetables, meat, liver, eggs, and whole grain breads and cereals. Do not try to lose weight until the abnormal bleeding has stopped and your blood iron level is back to normal.  SEEK MEDICAL CARE IF:   You need to change your sanitary pad or tampon more than once an hour.   You develop nausea (feeling sick to your stomach) and vomiting, dizziness, or diarrhea while you are taking your medicine.   You have any problems that may be related to the medicine you are taking.  SEEK IMMEDIATE MEDICAL CARE IF:   You have a fever.   You develop chills.   You develop severe bleeding or start to pass blood clots.   You feel dizzy or faint.  MAKE SURE YOU:   Understand these instructions.   Will watch your condition.   Will get help right away if you are not doing well or get worse.  Document Released: 12/19/2004 Document Revised: 12/08/2010 Document  Reviewed: 08/09/2007 ExitCare Patient Information 2012 [employer]. 

## 2011-04-20 NOTE — ED Notes (Signed)
C/o suprapubic pain/cramps, heavy vaginal bleeding since yesterday. Reports periods usually regular, never this late. No c/o n/v

## 2011-04-21 LAB — URINE CULTURE
Colony Count: 8000
Culture  Setup Time: 201304181917

## 2011-04-21 NOTE — ED Provider Notes (Signed)
I saw and evaluated the patient, reviewed the resident's note and I agree with the findings and plan.   .Face to face Exam:  General:  Awake HEENT:  Atraumatic Resp:  Normal effort Abd:  Nondistended Neuro:No focal weakness Lymph: No adenopathy   Nelia Shi, MD 04/21/11 2047

## 2011-04-22 ENCOUNTER — Ambulatory Visit (INDEPENDENT_AMBULATORY_CARE_PROVIDER_SITE_OTHER): Payer: Commercial Managed Care - PPO | Admitting: Family Medicine

## 2011-04-22 ENCOUNTER — Ambulatory Visit: Payer: Commercial Managed Care - PPO

## 2011-04-22 DIAGNOSIS — J45902 Unspecified asthma with status asthmaticus: Secondary | ICD-10-CM

## 2011-04-22 DIAGNOSIS — R0602 Shortness of breath: Secondary | ICD-10-CM

## 2011-04-22 LAB — D-DIMER, QUANTITATIVE: D-Dimer, Quant: 0.22 ug/mL-FEU (ref 0.00–0.48)

## 2011-04-22 MED ORDER — PREDNISONE 20 MG PO TABS: ORAL_TABLET | ORAL | Status: AC

## 2011-04-22 MED ORDER — ALBUTEROL SULFATE (2.5 MG/3ML) 0.083% IN NEBU
2.5000 mg | INHALATION_SOLUTION | Freq: Four times a day (QID) | RESPIRATORY_TRACT | Status: AC
  Administered 2011-04-22: 2.5 mg via RESPIRATORY_TRACT

## 2011-04-22 NOTE — Progress Notes (Signed)
Patient Name: Debra Horn Date of Birth: 1985/07/15 Medical Record Number: [id number] Gender: female Date of Encounter: 04/22/2011  History of Present Illness:  Debra Horn is a 26 y.o. very pleasant female patient who presents with the following:  Here today with an asthma exacerbation.  Tried her albuterol but did not have as good relief as she usually does.  She has been worse since yesterday. No cough, no fever, no cold symptoms.  She has been out of her Qvar for some time- only had albuterol available.  Last used a puffer tx of albuterol about 30 minutes ago.  Also tried her nephew's nebulizer yesterday but did not have much relief.  She has used oral steroids in the past with success.  She is not taking her Qvar due to cost but does have an Rx available at the pharmacy.  No history of smoking No history of DVT/PE.  Recently treated at the ED for vaginal bleeding.  She has not started the provera she was prescribed but her bleeding is now much better.    SOB is worse with activity. No CP.  There is no problem list on file for this patient.  Past Medical History  Diagnosis Date  . Asthma   . Preeclampsia   . Hyperthyroidism    Past Surgical History  Procedure Date  . Cesarean section    History  Substance Use Topics  . Smoking status: Never Smoker   . Smokeless tobacco: Not on file  . Alcohol Use: No   No family history on file. Allergies  Allergen Reactions  . Suprax Rash    Medication list has been reviewed and updated.  Review of Systems: As per HPI- otherwise negative.  Physical Examination: Filed Vitals:   04/22/11 1253  BP: 123/77  Pulse: 81  Temp: 98.3 F (36.8 C)  TempSrc: Oral  Resp: 16  Height: 5\' 3"  (1.6 m)  Weight: 216 lb (97.977 kg)  SpO2: 100%    Body mass index is 38.26 kg/(m^2). GEN: WDWN, NAD, Non-toxic, A & O x 3, obese HEENT: Atraumatic, Normocephalic. Neck supple. No masses, No LAD. Ears and Nose: No  external deformity. CV: RRR, No M/G/R. No JVD. No thrill. No extra heart sounds. PULM: CTA B, no wheezes, crackles, rhonchi. No retractions. No resp. distress. No accessory muscle use.  Lungs seem clear ABD: S, NT, ND, +BS. No rebound. No HSM. EXTR: No c/c/e NEURO Normal gait.  PSYCH: Normally interactive. Conversant. Not depressed or anxious appearing.  Calm demeanor.   Albuterol neb: did not help relieve her symptoms very much  [facility] reading (PRIMARY) by  Dr. Patsy Lager.  Negative chest x-ray  CHEST - 2 VIEW  Comparison: None.  Findings: Cardiomediastinal silhouette is within normal limits. The lungs are free of focal consolidations and pleural effusions. No pulmonary edema. Visualized osseous structures have a normal appearance.  IMPRESSION: Negative exam.  Clinically significant discrepancy from primary report, if provided: None  Results for orders placed in visit on 04/22/11  D-DIMER, QUANTITATIVE      Component Value Range   D-Dimer, Quant 0.22  0.00 - 0.48 (ug/mL-FEU)     Assessment and Plan: 1. Asthma  albuterol (PROVENTIL) (2.5 MG/3ML) 0.083% nebulizer solution 2.5 mg, predniSONE (DELTASONE) 20 MG tablet  2. SOB (shortness of breath)  DG Chest 2 View, D-dimer, quantitative   Likely asthma exacerbation which has not responded to albuterol.  Encouraged her to start back on her qvar if at all possible. I have called  the [employer] rep to try and get some samples. Will give a short course of PO steroids in the meantime.  Await D- Dimer to help rule- out PE- this result negative, called patient and let her know.   Autie is to call if she is not better in the next day of so- Sooner if worse.     Seen at ED on 04/20/11 for menstrual bleeding and had a negative HCG then

## 2011-04-27 ENCOUNTER — Other Ambulatory Visit: Payer: Self-pay | Admitting: Obstetrics & Gynecology

## 2011-04-27 ENCOUNTER — Other Ambulatory Visit: Payer: Self-pay | Admitting: *Deleted

## 2011-04-27 DIAGNOSIS — N63 Unspecified lump in unspecified breast: Secondary | ICD-10-CM

## 2011-05-02 ENCOUNTER — Ambulatory Visit
Admission: RE | Admit: 2011-05-02 | Discharge: 2011-05-02 | Disposition: A | Discharge status: Home / Self Care | Payer: Commercial Managed Care - PPO | Source: Ambulatory Visit | Attending: Obstetrics & Gynecology | Admitting: Obstetrics & Gynecology

## 2011-05-02 DIAGNOSIS — N63 Unspecified lump in unspecified breast: Secondary | ICD-10-CM

## 2011-05-12 ENCOUNTER — Other Ambulatory Visit: Payer: Self-pay | Admitting: Emergency Medicine

## 2011-05-14 ENCOUNTER — Other Ambulatory Visit: Payer: Self-pay | Admitting: Physician Assistant

## 2011-06-14 ENCOUNTER — Emergency Department (HOSPITAL_COMMUNITY)
Admission: EM | Admit: 2011-06-14 | Discharge: 2011-06-14 | Disposition: A | Discharge status: Home / Self Care | Payer: Commercial Managed Care - PPO | Source: Home / Self Care

## 2011-06-14 NOTE — ED Notes (Signed)
Patient was called at 1807 to be brought back to exam room, no response

## 2011-06-17 ENCOUNTER — Emergency Department (HOSPITAL_COMMUNITY)
Admission: EM | Admit: 2011-06-17 | Discharge: 2011-06-17 | Disposition: A | Discharge status: Home / Self Care | Payer: Commercial Managed Care - PPO | Attending: Emergency Medicine | Admitting: Emergency Medicine

## 2011-06-17 ENCOUNTER — Encounter (HOSPITAL_COMMUNITY): Payer: Self-pay

## 2011-06-17 DIAGNOSIS — L0291 Cutaneous abscess, unspecified: Secondary | ICD-10-CM

## 2011-06-17 DIAGNOSIS — IMO0002 Reserved for concepts with insufficient information to code with codable children: Secondary | ICD-10-CM | POA: Insufficient documentation

## 2011-06-17 NOTE — ED Notes (Signed)
Pt in from home wit abscess to the left forearm x5 days states pain radiates up arm area is raised and swollen denies drainage

## 2011-06-17 NOTE — ED Provider Notes (Signed)
History     CSN: [id number]  Arrival date & time 06/17/11  1057   First MD Initiated Contact with Patient 06/17/11 1105      Chief Complaint  Patient presents with  . Arm Pain  . Abscess    (Consider location/radiation/quality/duration/timing/severity/associated sxs/prior treatment) HPI Comments: Patient presents with left forearm abscess that started as a small bump approximately 5 days ago and increased in size yesterday. Patient complains of pain, redness, and swelling. She denies drainage. She denies streaking up her arm. She denies fever, nausea or vomiting. Patient took a Percocet for pain. Nothing makes the symptoms worse. Pain is not radiating. Onset was acute.   Patient is a 26 y.o. female presenting with abscess. The history is provided by the patient.  Abscess  This is a new problem. The current episode started less than one week ago. The onset was gradual. The problem has been gradually worsening. The abscess is present on the left arm. The abscess is characterized by painfulness, redness and swelling. Pertinent negatives include no fever and no vomiting.    Past Medical History  Diagnosis Date  . Asthma   . Preeclampsia   . Hyperthyroidism     Past Surgical History  Procedure Date  . Cesarean section     No family history on file.  History  Substance Use Topics  . Smoking status: Never Smoker   . Smokeless tobacco: Not on file  . Alcohol Use: No    OB History    Grav Para Term Preterm Abortions TAB SAB Ect Mult Living                  Review of Systems  Constitutional: Negative for fever.  Gastrointestinal: Negative for nausea and vomiting.  Skin: Positive for color change.       Positive for abscess.  Hematological: Negative for adenopathy.    Allergies  Cefixime  Home Medications   Current Outpatient Rx  Name Route Sig Dispense Refill  . MEDROXYPROGESTERONE ACETATE 5 MG PO TABS Oral Take 2 tablets (10 mg total) by mouth daily. 10 tablet  0  . VENTOLIN HFA 108 (90 BASE) MCG/ACT IN AERS  INHALE 2 PUFFS BY MOUTH EVERY 4 TO 6 HOURS AS NEEDED 1 Inhaler 2    BP 125/78  Pulse 79  Temp 98.4 F (36.9 C) (Oral)  Resp 16  SpO2 100%  LMP 04/17/2011  Physical Exam  Nursing note and vitals reviewed. Constitutional: She appears well-developed and well-nourished.  HENT:  Head: Normocephalic and atraumatic.  Eyes: Conjunctivae are normal.  Neck: Normal range of motion. Neck supple.  Pulmonary/Chest: No respiratory distress.  Musculoskeletal:       Left forearm: She exhibits tenderness and swelling. She exhibits no bony tenderness.       Arms: Neurological: She is alert.  Skin: Skin is warm and dry.  Psychiatric: She has a normal mood and affect.    ED Course  Procedures (including critical care time)  Labs Reviewed - No data to display No results found.   1. Abscess     11:44 AM Patient seen and examined. Work-up initiated. Medications ordered.   Vital signs reviewed and are as follows: Filed Vitals:   06/17/11 1108  BP: 125/78  Pulse: 79  Temp: 98.4 F (36.9 C)  Resp: 16    INCISION AND DRAINAGE Performed by: Carolee Rota Consent: Verbal consent obtained. Risks and benefits: risks, benefits and alternatives were discussed Type: abscess  Body area: L forearm  Anesthesia: local infiltration  Local anesthetic: lidocaine 2% with epinephrine  Anesthetic total: 3 ml  Complexity: complex Blunt dissection to break up loculations  Drainage: purulent  Drainage amount: moderate  Packing material: none, not large enough to pack  Patient tolerance: Patient tolerated the procedure well with no immediate complications.  11:51 AM The patient was urged to return to the Emergency Department urgently with worsening pain, swelling, expanding erythema especially if it streaks away from the affected area, fever, or if they have any other concerns.   The patient was instructed to return to the Emergency  Department or go to their PCP at that time for a recheck if their symptoms are not entirely resolved.  The patient verbalized understanding and stated agreement with this plan.      MDM  Patient with skin abscess amenable to incision and drainage.  Abscess was not large enough to warrant packing with removal and wound recheck in 2 days. No signs of cellulitis is surrounding skin.  Will d/c to home.  No antibiotic therapy is indicated.         Renne Crigler, [place] 06/17/11 1151

## 2011-06-17 NOTE — Discharge Instructions (Signed)
Please read and follow all provided instructions.  Your diagnoses today include:  1. Abscess     Tests performed today include:  Vital signs. See below for your results today.   Wound culture - to determine what antibiotics work on your infection  Medications prescribed:   None   Home care instructions:   Follow any educational materials contained in this packet  Follow-up instructions: Return to the Emergency Department in 48 hours for a recheck if your symptoms are not significantly improved.  Please follow-up with your primary care provider in the next 1 week for further evaluation of your symptoms. If you do not have a primary care doctor -- see below for referral information.   Return instructions:  Return to the Emergency Department if you have:  Fever  Worsening symptoms  Worsening pain  Worsening swelling  Redness of the skin that moves away from the affected area, especially if it streaks away from the affected area   Any other emergent concerns  Additional Information: If you have recurrent abscesses, try both the following. Use a Qtip to apply an over-the-counter antibiotic to the inside of your nostrils, twice a day for 5 days. Wash your body with over-the-counter Hibaclens once a day for one week and then once every two weeks. This can reduce the amount of bacterial on your skin that causes boils and lead to fewer boils. If you continue to have multiple or recurrent boils, you should see a dermatologist (skin doctor).   Your vital signs today were: BP 125/78  Pulse 79  Temp 98.4 F (36.9 C) (Oral)  Resp 16  SpO2 100%  LMP 04/17/2011 If your blood pressure (BP) was elevated above 135/85 this visit, please have this repeated by your doctor within one month. -------------- No Primary Care Doctor Call [facility]  [phone] Other agencies that provide inexpensive medical care    [facility]  [phone]    [facility]   [phone]    [facility]  [phone]    [facility]  [phone]    [facility]  [phone]    [facility]  [phone] -------------- RESOURCE GUIDE:  Dental Problems  Patients with Medicaid: [facility] [phone]                                            [phone] [address] Phone:  [phone]                                                   Phone:  [phone]  If unable to pay or uninsured, contact:  [facility] or [facility]. to become qualified for the adult dental clinic.  Chronic Pain Problems Contact [facility]  [phone] Patients need to be referred by their primary care doctor.  Insufficient Money for Medicine Contact [employer]:  call "211" or [facility] [phone].  Psychological Services [facility]  [phone] [facility]  [phone] [facility]   [phone] (emergency services [phone])  Substance  Abuse Resources [facility]  [phone] [facility] [phone] [facility] [phone] Floydene Flock [phone] Residential & Outpatient Substance Abuse Program  [phone]  Abuse/Neglect [facility] Child Abuse Hotline 7793309508 [facility] Child Abuse Hotline [phone] (After Hours)  Emergency Shelter [facility] [phone]  Maternity Homes [facility] [place] [facility] [phone] [place] [facility] [phone]  [facility] Resources  [facility]     [employer]                          [facility] Dept. [address] [place]                       [address]      [address]  Blondell Reveal Phone:  [phone]                                   Phone:  [phone]                  Phone:  [phone]  [facility] Phone:  [phone]  [facility] Child Abuse Hotline 816-329-7504 [phone] (After Hours)

## 2011-06-17 NOTE — ED Notes (Signed)
Pt verbalizes understanding 

## 2011-06-18 NOTE — ED Provider Notes (Signed)
Medical screening examination/treatment/procedure(s) were conducted as a shared visit with non-physician practitioner(s) and myself.  I personally evaluated the patient during the encounter Pt with forearm abscess, small. I and D in ED.   Suzi Roots, MD 06/18/11 [phone]

## 2011-06-27 ENCOUNTER — Other Ambulatory Visit: Payer: Self-pay

## 2011-06-27 ENCOUNTER — Encounter (HOSPITAL_COMMUNITY): Payer: Self-pay | Admitting: *Deleted

## 2011-06-27 ENCOUNTER — Emergency Department (HOSPITAL_COMMUNITY): Payer: Commercial Managed Care - PPO

## 2011-06-27 ENCOUNTER — Emergency Department (HOSPITAL_COMMUNITY)
Admission: EM | Admit: 2011-06-27 | Discharge: 2011-06-27 | Disposition: A | Discharge status: Home / Self Care | Payer: Commercial Managed Care - PPO | Attending: Emergency Medicine | Admitting: Emergency Medicine

## 2011-06-27 DIAGNOSIS — F411 Generalized anxiety disorder: Secondary | ICD-10-CM | POA: Insufficient documentation

## 2011-06-27 DIAGNOSIS — E0590 Thyrotoxicosis, unspecified without thyrotoxic crisis or storm: Secondary | ICD-10-CM | POA: Insufficient documentation

## 2011-06-27 DIAGNOSIS — R071 Chest pain on breathing: Secondary | ICD-10-CM | POA: Insufficient documentation

## 2011-06-27 DIAGNOSIS — R110 Nausea: Secondary | ICD-10-CM | POA: Insufficient documentation

## 2011-06-27 DIAGNOSIS — R0789 Other chest pain: Secondary | ICD-10-CM

## 2011-06-27 DIAGNOSIS — Z79899 Other long term (current) drug therapy: Secondary | ICD-10-CM | POA: Insufficient documentation

## 2011-06-27 DIAGNOSIS — J45909 Unspecified asthma, uncomplicated: Secondary | ICD-10-CM | POA: Insufficient documentation

## 2011-06-27 LAB — DIFFERENTIAL
Basophils Absolute: 0 10*3/uL (ref 0.0–0.1)
Eosinophils Relative: 2 % (ref 0–5)
Lymphocytes Relative: 52 % — ABNORMAL HIGH (ref 12–46)
Lymphs Abs: 2.9 10*3/uL (ref 0.7–4.0)
Monocytes Absolute: 0.4 10*3/uL (ref 0.1–1.0)
Neutro Abs: 2.2 10*3/uL (ref 1.7–7.7)

## 2011-06-27 LAB — CBC
HCT: 36.4 % (ref 36.0–46.0)
MCV: 87.3 fL (ref 78.0–100.0)
RBC: 4.17 MIL/uL (ref 3.87–5.11)
RDW: 14.6 % (ref 11.5–15.5)
WBC: 5.6 10*3/uL (ref 4.0–10.5)

## 2011-06-27 LAB — POCT I-STAT, CHEM 8
Calcium, Ion: 1.17 mmol/L (ref 1.12–1.32)
HCT: 39 % (ref 36.0–46.0)
TCO2: 20 mmol/L (ref 0–100)

## 2011-06-27 LAB — D-DIMER, QUANTITATIVE: D-Dimer, Quant: <0.22 ug/mL-FEU (ref 0.00–0.48)

## 2011-06-27 MED ORDER — IBUPROFEN 800 MG PO TABS
800.0000 mg | ORAL_TABLET | Freq: Once | ORAL | Status: AC
  Administered 2011-06-27: 800 mg via ORAL
  Filled 2011-06-27: qty 1

## 2011-06-27 MED ORDER — IBUPROFEN 800 MG PO TABS: 800.0000 mg | ORAL_TABLET | Freq: Once | ORAL | Status: DC

## 2011-06-27 NOTE — Discharge Instructions (Signed)
Alternate between tylenol and ibuprofen as needed for pain and follow up with your primary care provider for recheck of ongoing chest discomfort but return to ER for emergent changing or worsening of symptoms.   Chest Wall Pain Chest wall pain is pain in or around the bones and muscles of your chest. It may take up to 6 weeks to get better. It may take longer if you must stay physically active in your work and activities.  CAUSES  Chest wall pain may happen on its own. However, it may be caused by:  A viral illness like the flu.   Injury.   Coughing.   Exercise.   Arthritis.   Fibromyalgia.   Shingles.  HOME CARE INSTRUCTIONS   Avoid overtiring physical activity. Try not to strain or perform activities that cause pain. This includes any activities using your chest or your abdominal and side muscles, especially if heavy weights are used.   Put ice on the sore area.   Put ice in a plastic bag.   Place a towel between your skin and the bag.   Leave the ice on for 15 to 20 minutes per hour while awake for the first 2 days.   Only take over-the-counter or prescription medicines for pain, discomfort, or fever as directed by your caregiver.  SEEK IMMEDIATE MEDICAL CARE IF:   Your pain increases, or you are very uncomfortable.   You have a fever.   Your chest pain becomes worse.   You have new, unexplained symptoms.   You have nausea or vomiting.   You feel sweaty or lightheaded.   You have a cough with phlegm (sputum), or you cough up blood.  MAKE SURE YOU:   Understand these instructions.   Will watch your condition.   Will get help right away if you are not doing well or get worse.  Document Released: 12/19/2004 Document Revised: 12/08/2010 Document Reviewed: 08/15/2010 [facility] Patient Information 2012 [place], [place].

## 2011-06-27 NOTE — ED Provider Notes (Signed)
History     CSN: [id number]  Arrival date & time 06/27/11  1209   None     Chief Complaint  Patient presents with  . Chest Pain  . Nausea    (Consider location/radiation/quality/duration/timing/severity/associated sxs/prior treatment) HPI  Patient presents to emergency department complaining of acute onset chest pain that has been intermittent throughout the day. Patient states she feels a sharp pain in the right side of her chest that radiates straight to her back with deep inspiration. Patient states she has a mild tightness in the right side of her chest at rest however states that she'll feel sharp pain which he takes a deep breath. Patient denies history of similar pain but states she's had left-sided chest discomfort associated with stress. Patient denies any exogenous estrogen use or tobacco use. Denies any fevers, chills, cough, shortness of breath, hemoptysis, abdominal pain, vomiting, or diaphoresis. Patient states that she had some mild nausea this morning that resolved. Patient states she took an aspirin this morning with no change in symptoms.   Past Medical History  Diagnosis Date  . Asthma   . Preeclampsia   . Hyperthyroidism   . Anxiety     Past Surgical History  Procedure Date  . Cesarean section     History reviewed. No pertinent family history.  History  Substance Use Topics  . Smoking status: Never Smoker   . Smokeless tobacco: Never Used  . Alcohol Use: No    OB History    Grav Para Term Preterm Abortions TAB SAB Ect Mult Living                  Review of Systems  All other systems reviewed and are negative.    Allergies  Cefixime  Home Medications   Current Outpatient Rx  Name Route Sig Dispense Refill  . ALBUTEROL SULFATE HFA 108 (90 BASE) MCG/ACT IN AERS Inhalation Inhale 2 puffs into the lungs every 6 (six) hours as needed. Wheezing    . ASPIRIN 325 MG PO TABS Oral Take 325 mg by mouth every 4 (four) hours as needed. Pain    .  DIPHENHYDRAMINE HCL 25 MG PO TABS Oral Take 25-75 mg by mouth every 6 (six) hours as needed.      BP 123/92  Pulse 70  Temp 98 F (36.7 C) (Oral)  Resp 16  SpO2 99%  LMP 04/17/2011  Physical Exam  Nursing note and vitals reviewed. Constitutional: She is oriented to person, place, and time. She appears well-developed and well-nourished. No distress.  HENT:  Head: Normocephalic and atraumatic.  Eyes: Conjunctivae are normal.  Neck: Normal range of motion. Neck supple.  Cardiovascular: Normal rate, regular rhythm, normal heart sounds and intact distal pulses.  Exam reveals no gallop and no friction rub.   No murmur heard. Pulmonary/Chest: Effort normal and breath sounds normal. No respiratory distress. She has no wheezes. She has no rales. She exhibits no tenderness.  Abdominal: Soft. Bowel sounds are normal. She exhibits no distension and no mass. There is no tenderness. There is no rebound and no guarding.  Musculoskeletal: Normal range of motion. She exhibits no edema and no tenderness.  Neurological: She is alert and oriented to person, place, and time.  Skin: Skin is warm and dry. No rash noted. She is not diaphoretic. No erythema.  Psychiatric: She has a normal mood and affect.    ED Course  Procedures (including critical care time)  PO ibuprofen   Date: 06/27/2011  Rate:  62  Rhythm: normal sinus rhythm  QRS Axis: normal  Intervals: normal  ST/T Wave abnormalities: normal  Conduction Disutrbances: none  Narrative Interpretation:   Old EKG Reviewed: non provocative EKG compared to Jul 01, 2009   Labs Reviewed  DIFFERENTIAL - Abnormal; Notable for the following:    Neutrophils Relative 39 (*)     Lymphocytes Relative 52 (*)     All other components within normal limits  CBC  D-DIMER, QUANTITATIVE  POCT I-STAT, CHEM 8   Dg Chest 2 View  06/27/2011  *RADIOLOGY REPORT*  Clinical Data: Chest pain.  Shortness of breath.  History of hypertension and asthma.  CHEST - 2  VIEW  Comparison: Two-view chest x-ray 07/01/2009, 07/15/2008, 10/16/2007.  Findings: Cardiomediastinal silhouette unremarkable.  Lungs clear. Bronchovascular markings normal.  Pulmonary vascularity normal.  No pleural effusions.  No pneumothorax.  Visualized bony thorax intact.  No significant interval change.  IMPRESSION: Normal and stable examination.  Original Report Authenticated By: Arnell Sieving, M.D.     1. Chest wall pain       MDM  Patient is low-risk for both acute coronary syndrome and pulmonary embolism with a negative d-dimer. Her pain is in the right side of her chest and is partially reproducible. No acute on labs or EKG. No acute findings on chest x-ray. Patient is agreeable to following up with primary care provider as needed in taking NSAIDs and Tylenol as needed for pain her gave strict precautions for return to ER. Patient voices her understanding and is agreeable plan.        [place], [place] 06/27/11 1644

## 2011-06-27 NOTE — ED Notes (Signed)
Pt from home with reports of sharp pain in right chest when taking a deep breath as well as nausea. Pt reports symptoms started at around 0100 today. Pt endorses similar episodes and was told was related to anxiety.

## 2011-06-28 NOTE — ED Provider Notes (Signed)
Medical screening examination/treatment/procedure(s) were performed by non-physician practitioner and as supervising physician I was immediately available for consultation/collaboration.  Doug Sou, MD 06/28/11 [phone]

## 2011-07-31 ENCOUNTER — Other Ambulatory Visit: Payer: Self-pay | Admitting: Physician Assistant

## 2011-08-24 ENCOUNTER — Other Ambulatory Visit: Payer: Self-pay | Admitting: Family Medicine

## 2011-08-24 MED ORDER — ALBUTEROL SULFATE HFA 108 (90 BASE) MCG/ACT IN AERS: 2.0000 | INHALATION_SPRAY | Freq: Four times a day (QID) | RESPIRATORY_TRACT | Status: DC | PRN

## 2011-09-26 ENCOUNTER — Other Ambulatory Visit: Payer: Self-pay | Admitting: Physician Assistant

## 2011-11-03 DIAGNOSIS — I2699 Other pulmonary embolism without acute cor pulmonale: Secondary | ICD-10-CM

## 2011-11-03 HISTORY — DX: Other pulmonary embolism without acute cor pulmonale: I26.99

## 2011-11-16 NOTE — Progress Notes (Signed)
Completed prior auth for pt's ventolin over the phone and received approval (Pro-air is not effective for pt) through 11/15/12, case # [id number]. Faxed approval notice to pharmacy.

## 2011-12-22 ENCOUNTER — Telehealth: Payer: Self-pay

## 2011-12-22 MED ORDER — ALBUTEROL SULFATE HFA 108 (90 BASE) MCG/ACT IN AERS: 1.0000 | INHALATION_SPRAY | Freq: Four times a day (QID) | RESPIRATORY_TRACT | Status: DC | PRN

## 2011-12-22 NOTE — Telephone Encounter (Signed)
[facility] is needing an authorization to refill pt ventilatin. Please contact [facility] [phone]

## 2011-12-22 NOTE — Telephone Encounter (Signed)
Last ov for this was April, please advise.

## 2011-12-22 NOTE — Telephone Encounter (Signed)
We can send one inhaler, but patient needs OV for more

## 2011-12-24 ENCOUNTER — Other Ambulatory Visit: Payer: Self-pay | Admitting: Physician Assistant

## 2012-01-05 ENCOUNTER — Other Ambulatory Visit: Payer: Self-pay | Admitting: Emergency Medicine

## 2012-07-09 ENCOUNTER — Encounter (HOSPITAL_COMMUNITY): Payer: Self-pay | Admitting: *Deleted

## 2012-07-09 ENCOUNTER — Inpatient Hospital Stay (HOSPITAL_COMMUNITY)
Admission: AD | Admit: 2012-07-09 | Discharge: 2012-07-09 | Disposition: A | Discharge status: Home / Self Care | Payer: Medicaid Other | Source: Ambulatory Visit | Attending: Family Medicine | Admitting: Family Medicine

## 2012-07-09 ENCOUNTER — Other Ambulatory Visit: Payer: Self-pay

## 2012-07-09 DIAGNOSIS — O99891 Other specified diseases and conditions complicating pregnancy: Secondary | ICD-10-CM | POA: Insufficient documentation

## 2012-07-09 DIAGNOSIS — Z86711 Personal history of pulmonary embolism: Secondary | ICD-10-CM | POA: Insufficient documentation

## 2012-07-09 DIAGNOSIS — R079 Chest pain, unspecified: Secondary | ICD-10-CM | POA: Insufficient documentation

## 2012-07-09 DIAGNOSIS — K219 Gastro-esophageal reflux disease without esophagitis: Secondary | ICD-10-CM

## 2012-07-09 HISTORY — DX: Other pulmonary embolism without acute cor pulmonale: I26.99

## 2012-07-09 MED ORDER — PANTOPRAZOLE SODIUM 40 MG PO TBEC: 40.0000 mg | DELAYED_RELEASE_TABLET | Freq: Every day | ORAL | Status: DC

## 2012-07-09 MED ORDER — ACETAMINOPHEN 325 MG PO TABS
650.0000 mg | ORAL_TABLET | Freq: Once | ORAL | Status: AC
  Administered 2012-07-09: 650 mg via ORAL
  Filled 2012-07-09: qty 2

## 2012-07-09 MED ORDER — GI COCKTAIL ~~LOC~~
30.0000 mL | Freq: Once | ORAL | Status: AC
  Administered 2012-07-09: 30 mL via ORAL
  Filled 2012-07-09: qty 30

## 2012-07-09 NOTE — MAU Note (Signed)
Pt having chest and back tightness and sharp pain that comes and goes x 1 wk.  Hx PE on Lovenox, has not taken x 2 days.  G2 P1 at 27.2wks prenatal care in [place], last appt 6/9.

## 2012-07-09 NOTE — MAU Provider Note (Signed)
History     CSN: [id number]  Arrival date and time: 07/09/12 0119   First Provider Initiated Contact with Patient 07/09/12 0151      Chief Complaint  Patient presents with  . Chest Pain  . Back Pain   HPI - 27 y.o. G2P0101 at [redacted]w[redacted]d here with chest pain and tightness intermittently x 1 week, with h/o PE 2013 on lovenox BID which she has not taken in 2 days. Pt reports that she has had 1.5 weeks of worsening chest tightness with intermittent sharp mid-chest and underarm pain that lasts a few seconds and occurs every 5 minutes. The sharp pain worries her and catches her offguard. She thinks symptoms are related to fatty food intake and are worse at night, and also reports increased burping and 2 weeks of increased SOB (has dyspnea at baseline). Denies dizziness or diaphoresis with episodes, syncope, relationship with activity or emotion, sour taste, fevers chills, emesis during chest pain, dysuria, LE edema, or URI symptoms. She has tried albuterol and tylenol but neither helps. She does not take anything for acid reflux.   She currently reports the chest tightness but no pain.  OB history: Prenatal care in [place], [place], last appointment 6/9. Now moved here and has not started [facility] yet.  No prenatal problems so far, <16 weeks significant vomiting, lost 50 lbs, now weight improved. No GDM or HTN; 1st preg, had pre-x G2P0101 c-section at 31 weeks for pre-eclampsia  Past Medical History  Diagnosis Date  . Asthma   . Preeclampsia   . Hyperthyroidism   . Anxiety   h/o PE  Inhaler 2 times last 2 days.  Past Surgical History  Procedure Laterality Date  . Cesarean section      No family history on file.  History  Substance Use Topics  . Smoking status: Never Smoker   . Smokeless tobacco: Never Used  . Alcohol Use: No  Moved 2 weeks ago to [place].  Allergies:  Allergies  Allergen Reactions  . Cefixime Rash    Prescriptions prior to admission  Medication Sig Dispense  Refill  . albuterol (VENTOLIN HFA) 108 (90 BASE) MCG/ACT inhaler Inhale 1-2 puffs into the lungs every 6 (six) hours as needed for wheezing.  1 Inhaler  0  . aspirin 325 MG tablet Take 325 mg by mouth every 4 (four) hours as needed. Pain      . diphenhydrAMINE (BENADRYL) 25 MG tablet Take 25-75 mg by mouth every 6 (six) hours as needed.      . VENTOLIN HFA 108 (90 BASE) MCG/ACT inhaler INHALE 2 PUFFS EVERY 6 HOURS AS NEEDED  18 g  5  QVAR lovenox 75mg  BID IM, missed last 2 days - missed because stomach sore  ROS-Per HPI Physical Exam   Blood pressure 116/74, pulse 87, temperature 98.1 F (36.7 C), temperature source Oral, resp. rate 18, height 5\' 3"  (1.6 m), weight 90.719 kg (200 lb), SpO2 100.00%.  Physical Exam  Constitutional: She appears well-developed and well-nourished. No distress.  HENT:  Head: Normocephalic and atraumatic.  Eyes: Conjunctivae and EOM are normal.  Neck: Normal range of motion. Neck supple.  Cardiovascular: Normal rate, regular rhythm and normal heart sounds.   No murmur heard. Respiratory: Effort normal and breath sounds normal. No stridor. No respiratory distress. She has no wheezes. She has no rales. She exhibits no tenderness.  GI: Soft. She exhibits no distension and no mass. There is no tenderness. There is no rebound and no guarding.  Musculoskeletal:  Normal range of motion. She exhibits no edema and no tenderness.  Neurological: She is alert.  Skin: Skin is warm and dry. No rash noted. She is not diaphoretic. No erythema.  Psychiatric: She has a normal mood and affect. Her behavior is normal.   MAU Course  Procedures  MDM EKG: Nonspecific t wave abnromality: Inverted T wave in V3 GI cocktail given, but thrown up shortly after  Assessment and Plan  27 y.o. G2P0101 at [redacted]w[redacted]d here with chest pain and tightness intermittently x 1 week, with h/o PE 2013 on lovenox BID which she has not taken in 2 days.   # Chest pain - Most likely gastrointestinal/GERD  with burping, relationship with food and nighttime. Other ddx includes ACS (unlikely with stable VS and no ST elevation in EKG, vital signs stable), PE (unlikely with stable vitals and well-appearing), MSK. - Strongly encouraged staying compliant with lovenox - Would not check D-dimer during pregnancy - GI cocktail attempted but vomited - Tylenol for pain here - Rx for protonix 40mg  daily - Return precautions reviewed  # Pregnancy - Strongly recommended initiating Prenatal care here, recommending calling [facility] outpatient clinic (msg sent to [facility] outpatient clinic admin to facilitate).  Simone Curia 07/09/2012, 1:51 AM   I saw and examined patient and agree with above resident note. I reviewed history, imaging, labs, and vitals. I personally reviewed the fetal heart tracing, and it is reactive. Discussed importance of staying on Lovenox with patient. Agree symptoms are most likely GI related. No hypoxia or tachycardia to indicate PE. No calf tenderness or swelling. Pepcid given in ED prior to discharge. Stable for discharge home. Napoleon Form, MD

## 2012-07-10 NOTE — MAU Provider Note (Signed)
Chart reviewed and agree with management and plan.  

## 2012-07-24 ENCOUNTER — Inpatient Hospital Stay (HOSPITAL_COMMUNITY): Payer: Medicaid Other

## 2012-07-24 ENCOUNTER — Encounter (HOSPITAL_COMMUNITY): Payer: Self-pay

## 2012-07-24 ENCOUNTER — Inpatient Hospital Stay (HOSPITAL_COMMUNITY)
Admission: AD | Admit: 2012-07-24 | Discharge: 2012-07-24 | Disposition: A | Discharge status: Home / Self Care | Payer: Medicaid Other | Source: Ambulatory Visit | Attending: Obstetrics & Gynecology | Admitting: Obstetrics & Gynecology

## 2012-07-24 DIAGNOSIS — Z86711 Personal history of pulmonary embolism: Secondary | ICD-10-CM | POA: Diagnosis present

## 2012-07-24 DIAGNOSIS — O99891 Other specified diseases and conditions complicating pregnancy: Secondary | ICD-10-CM | POA: Insufficient documentation

## 2012-07-24 DIAGNOSIS — R0602 Shortness of breath: Secondary | ICD-10-CM

## 2012-07-24 DIAGNOSIS — O0990 Supervision of high risk pregnancy, unspecified, unspecified trimester: Secondary | ICD-10-CM

## 2012-07-24 DIAGNOSIS — R1310 Dysphagia, unspecified: Secondary | ICD-10-CM | POA: Insufficient documentation

## 2012-07-24 DIAGNOSIS — J040 Acute laryngitis: Secondary | ICD-10-CM

## 2012-07-24 HISTORY — DX: Gestational (pregnancy-induced) hypertension without significant proteinuria, unspecified trimester: O13.9

## 2012-07-24 LAB — URINALYSIS, ROUTINE W REFLEX MICROSCOPIC
Glucose, UA: NEGATIVE mg/dL
Ketones, ur: NEGATIVE mg/dL
Leukocytes, UA: NEGATIVE
Nitrite: NEGATIVE
Protein, ur: NEGATIVE mg/dL

## 2012-07-24 NOTE — Progress Notes (Signed)
LLeftwich-Kirby, CNM called unit, notified pt had decel, efm wnl at present, 02 sat 98%, will be down shortly to see pt.

## 2012-07-24 NOTE — MAU Note (Signed)
Patient states she had a PE prior to her first pregnancy and takes Lovenox daily. Had a cesarean section at 31 weeks with last pregnancy due to preeclampsia.

## 2012-07-24 NOTE — MAU Note (Signed)
Pt states has OB appt with [facility] on 08/09/2012. Has had feeling that throat is swelling and has had difficulty swallowing since last pm. Denies abdominal pain.

## 2012-07-24 NOTE — MAU Note (Signed)
Patient states she has a history of asthma and has used her inhaler without relief. States she feels like her throat is swelling causing difficulty breathing and swallowing. Has been taking benadryl last does around 1200, no relief. Feels like she is choking when trying to sleep. Had sweating and nose bleed around 1100. Denies vaginal bleeding, leaking or abdominal pain today. Reports good fetal movement. Patient has recently moved to [place] from [place] where she had been getting prenatal care. Has a clinic appointment next week.

## 2012-07-24 NOTE — MAU Note (Signed)
LS clear bilaterally

## 2012-07-24 NOTE — MAU Provider Note (Signed)
History     CSN: [id number]  Arrival date and time: 07/24/12 1341   First Provider Initiated Contact with Patient 07/24/12 1539      Chief Complaint  Patient presents with  . Shortness of Breath   HPI 27 yo G2P0101 at [redacted]w[redacted]d with history of asthma, history of PE in 2013 currently on Lovenox presents with shortness of breath.  States she had difficulty swallowing food last night after drinking a milkshake and shortness of breath started after with feeling of tongue swelling.  Reports this has happened a couple time several years ago in which Benadryl has helped but has not helped this time.  Last benadryl dose was at noon today.  This morning she reported having a bloody nose and reports having recent indigestion.  Denies chest pain, headaches, recent changes in medications, abdominal pain, vaginal bleeding or discharge. States she has had issues in the past with hyperthyroidism but takes medication only during pregnancy.  She reports good fetal movement.      Past Medical History  Diagnosis Date  . Asthma   . Preeclampsia   . Hyperthyroidism   . Anxiety   . PE (pulmonary embolism) 11/2011  . Preeclampsia   . Pregnancy induced hypertension   . Preterm labor     Past Surgical History  Procedure Laterality Date  . Cesarean section      History reviewed. No pertinent family history.  History  Substance Use Topics  . Smoking status: Never Smoker   . Smokeless tobacco: Never Used  . Alcohol Use: No    Allergies:  Allergies  Allergen Reactions  . Cefixime Rash    Prescriptions prior to admission  Medication Sig Dispense Refill  . albuterol (VENTOLIN HFA) 108 (90 BASE) MCG/ACT inhaler Inhale 1-2 puffs into the lungs every 6 (six) hours as needed for wheezing.  1 Inhaler  0  . beclomethasone (QVAR) 40 MCG/ACT inhaler Inhale 2 puffs into the lungs 2 (two) times daily.      . diphenhydrAMINE (BENADRYL) 25 MG tablet Take 25-50 mg by mouth every 6 (six) hours as needed for  allergies.       Marland Kitchen enoxaparin (LOVENOX) 80 MG/0.8ML injection Inject 75 mg into the skin every 12 (twelve) hours.        Review of Systems  Constitutional: Negative for diaphoresis.  HENT: Positive for nosebleeds.        Neck pain   Respiratory: Positive for shortness of breath. Negative for cough and wheezing.   Cardiovascular: Negative for chest pain.  Gastrointestinal: Negative for abdominal pain.  Skin: Negative for itching and rash.  Neurological: Negative for dizziness and headaches.   Physical Exam   Blood pressure 121/83, pulse 88, temperature 98.7 F (37.1 C), temperature source Oral, resp. rate 20, height 5\' 2"  (1.575 m), weight 91.173 kg (201 lb), SpO2 98.00%.  Physical Exam  Constitutional: She is oriented to person, place, and time. She appears well-developed and well-nourished. No distress.  HENT:  Head: Normocephalic and atraumatic.  Eyes: Conjunctivae are normal. Pupils are equal, round, and reactive to light.  Neck: Normal range of motion. Neck supple. No tracheal deviation present. No thyromegaly present.  No edema in oropharynx, tongue size normal, tonsils 2+ with no erythema noted   Cardiovascular: Normal rate, regular rhythm and normal heart sounds.  Exam reveals no friction rub.   No murmur heard. Respiratory: Effort normal and breath sounds normal. No stridor. No respiratory distress. She has no wheezes. She has no rales.  GI: Soft. Bowel sounds are normal. She exhibits no distension. There is no tenderness. There is no rebound and no guarding.  Lymphadenopathy:    She has no cervical adenopathy.  Neurological: She is alert and oriented to person, place, and time.  Skin: Skin is warm and dry. She is not diaphoretic.  Psychiatric: She has a normal mood and affect. Her behavior is normal. Judgment and thought content normal.   Results for orders placed during the hospital encounter of 07/24/12 (from the past 24 hour(s))  URINALYSIS, ROUTINE W REFLEX  MICROSCOPIC     Status: None   Collection Time    07/24/12  2:10 PM      Result Value Range   Color, Urine YELLOW  YELLOW   APPearance CLEAR  CLEAR   Specific Gravity, Urine 1.025  1.005 - 1.030   pH 7.0  5.0 - 8.0   Glucose, UA NEGATIVE  NEGATIVE mg/dL   Hgb urine dipstick NEGATIVE  NEGATIVE   Bilirubin Urine NEGATIVE  NEGATIVE   Ketones, ur NEGATIVE  NEGATIVE mg/dL   Protein, ur NEGATIVE  NEGATIVE mg/dL   Urobilinogen, UA 0.2  0.0 - 1.0 mg/dL   Nitrite NEGATIVE  NEGATIVE   Leukocytes, UA NEGATIVE  NEGATIVE   Nonreactive NST with prolonged deceleration x1 and variables x2 in MAU, followed by accels and short (10-15 minutes) of Category 1 FHR tracing BPP 8/8, normal AFI   MAU Course  Procedures  MDM   Assessment and Plan  A: 27 yo G2P0101 at [redacted]w[redacted]d presenting with shortness of breath.  This along with foreign body sensation in neck could be GI related vs. Psychosomatic.  Less likely for it to be allergic rxn, PE, or cardiovascular  (vital signs stable  with O2 at 98% and normal physical exam).    P: -BPP per Dr [name] Lash -Will not give Benadryl in MAU at this time since she will be driving home -Follow up with clinic (next appt 08/09/12) -Continue with Lovenox  -Return to clinic/MAU if symptoms worsen   Chrissie Noa 07/24/2012, 3:48 PM   I have seen this patient and agree with the above PA student's note.  No s/sx of allergic rxn, PE, or cardiovascular problem with stable VS including O2 sats.  Pt appears comfortable in MAU room, breathing normally, able to tolerate PO fluids without difficulty swallowing.   LEFTWICH-KIRBY, LISA Certified Nurse-Midwife

## 2012-07-30 ENCOUNTER — Encounter (HOSPITAL_COMMUNITY): Payer: Self-pay | Admitting: *Deleted

## 2012-07-30 ENCOUNTER — Inpatient Hospital Stay (HOSPITAL_COMMUNITY): Payer: Medicaid Other

## 2012-07-30 ENCOUNTER — Inpatient Hospital Stay (HOSPITAL_COMMUNITY)
Admission: AD | Admit: 2012-07-30 | Discharge: 2012-07-30 | Disposition: A | Discharge status: Home / Self Care | Payer: Medicaid Other | Source: Ambulatory Visit | Attending: Obstetrics and Gynecology | Admitting: Obstetrics and Gynecology

## 2012-07-30 DIAGNOSIS — O99891 Other specified diseases and conditions complicating pregnancy: Secondary | ICD-10-CM

## 2012-07-30 DIAGNOSIS — H538 Other visual disturbances: Secondary | ICD-10-CM

## 2012-07-30 DIAGNOSIS — R9389 Abnormal findings on diagnostic imaging of other specified body structures: Secondary | ICD-10-CM

## 2012-07-30 DIAGNOSIS — R51 Headache: Secondary | ICD-10-CM | POA: Insufficient documentation

## 2012-07-30 DIAGNOSIS — R519 Headache, unspecified: Secondary | ICD-10-CM

## 2012-07-30 DIAGNOSIS — R030 Elevated blood-pressure reading, without diagnosis of hypertension: Secondary | ICD-10-CM | POA: Insufficient documentation

## 2012-07-30 DIAGNOSIS — O0993 Supervision of high risk pregnancy, unspecified, third trimester: Secondary | ICD-10-CM

## 2012-07-30 DIAGNOSIS — R079 Chest pain, unspecified: Secondary | ICD-10-CM | POA: Insufficient documentation

## 2012-07-30 LAB — COMPREHENSIVE METABOLIC PANEL
Albumin: 2.4 g/dL — ABNORMAL LOW (ref 3.5–5.2)
BUN: 7 mg/dL (ref 6–23)
Calcium: 9.1 mg/dL (ref 8.4–10.5)
Creatinine, Ser: 0.52 mg/dL (ref 0.50–1.10)
GFR calc Af Amer: >90 mL/min (ref >90)
Potassium: 3.8 mEq/L (ref 3.5–5.1)
Total Protein: 6.9 g/dL (ref 6.0–8.3)

## 2012-07-30 LAB — PROTEIN / CREATININE RATIO, URINE
Creatinine, Urine: 388.54 mg/dL
Protein Creatinine Ratio: 0.08 (ref 0.00–0.15)
Total Protein, Urine: 31.8 mg/dL

## 2012-07-30 LAB — CBC WITH DIFFERENTIAL/PLATELET
Basophils Relative: 0 % (ref 0–1)
Eosinophils Absolute: 0.1 10*3/uL (ref 0.0–0.7)
Eosinophils Relative: 1 % (ref 0–5)
Hemoglobin: 10.6 g/dL — ABNORMAL LOW (ref 12.0–15.0)
MCH: 25.9 pg — ABNORMAL LOW (ref 26.0–34.0)
MCHC: 32.3 g/dL (ref 30.0–36.0)
MCV: 80 fL (ref 78.0–100.0)
Monocytes Absolute: 0.7 10*3/uL (ref 0.1–1.0)
Monocytes Relative: 9 % (ref 3–12)
Neutrophils Relative %: 59 % (ref 43–77)

## 2012-07-30 LAB — URINALYSIS, ROUTINE W REFLEX MICROSCOPIC
Bilirubin Urine: NEGATIVE
Ketones, ur: NEGATIVE mg/dL
Nitrite: NEGATIVE
pH: 6.5 (ref 5.0–8.0)

## 2012-07-30 MED ORDER — LACTATED RINGERS IV BOLUS (SEPSIS): 1000.0000 mL | Freq: Once | INTRAVENOUS | Status: DC

## 2012-07-30 NOTE — MAU Note (Signed)
Feeling nauseated, on-going headache for couple days. Blurry vision and lights really bright. Sees white spot in visual field. Had preeclampsia with first pregnancy and had emergency at 31-32wks. Having same symptoms as had then

## 2012-07-30 NOTE — MAU Provider Note (Signed)
History     CSN: [id number]  Arrival date and time: 07/30/12 1443   First Provider Initiated Contact with Patient 07/30/12 1646      Chief Complaint  Patient presents with  . Chest Pain  . Headache  . Nausea   HPI 27 y.o. G2P0101 at [redacted]w[redacted]d presents for headache, vision changes, nausea. History of PE in 11/2011, on lovenox. Headache and visual changes started 2 days ago. HA described as left side, dull, constantly there. Has associated photophobia. Seeing many white spots throughout her vision, describes as similar to her last pregnancy when she was diagnosed with pre-eclampsia. Has associated dizziness that she has had before during the pregnancy, and has also been nauseas with 2 episodes of vomiting yesterday. She has not been able to eat or drink much. No RUQ or epigastric pain. She also gets intermittent sharp chest pain that she has gotten throughout her pregnancy that has been evaluated at the MAU for, but does not currently have this chest pain. Denies contractions, loss of fluid, bleeding, vaginal discharge, +FM in the last hour.  Prenatal course: Care at [facility], last appointment around 6/9 per note from Dr. Thad Ranger 07/09/2012 Scheduled to establish care at [facility] on 08/08/2012  OB History   Grav Para Term Preterm Abortions TAB SAB Ect Mult Living   2 1  1      1     Previous C-section at [redacted]w[redacted]d for severe pre-eclampsia  Past Medical History  Diagnosis Date  . Asthma   . Preeclampsia   . Hyperthyroidism   . Anxiety   . PE (pulmonary embolism) 11/2011  . Preeclampsia   . Pregnancy induced hypertension   . Preterm labor   . Pulmonary embolism Nov 2013    Past Surgical History  Procedure Laterality Date  . Cesarean section      Family History  Problem Relation Age of Onset  . Hypothyroidism Maternal Aunt   . Hypertension Maternal Grandfather   . Hypertension Paternal Grandmother     History  Substance Use Topics  . Smoking status: Never Smoker   . Smokeless  tobacco: Never Used  . Alcohol Use: No    Allergies:  Allergies  Allergen Reactions  . Cefixime Rash    Prescriptions prior to admission  Medication Sig Dispense Refill  . acetaminophen (TYLENOL) 500 MG tablet Take 1,000 mg by mouth every 6 (six) hours as needed for pain.      Marland Kitchen albuterol (VENTOLIN HFA) 108 (90 BASE) MCG/ACT inhaler Inhale 1-2 puffs into the lungs every 6 (six) hours as needed for wheezing.  1 Inhaler  0  . beclomethasone (QVAR) 40 MCG/ACT inhaler Inhale 2 puffs into the lungs 2 (two) times daily.      . diphenhydrAMINE (BENADRYL) 25 MG tablet Take 25-50 mg by mouth every 6 (six) hours as needed for allergies.       Marland Kitchen enoxaparin (LOVENOX) 80 MG/0.8ML injection Inject 75 mg into the skin every 12 (twelve) hours.        ROS negative except for above Physical Exam   Blood pressure 140/97, pulse 64, temperature 98.1 F (36.7 C), resp. rate 20, height 5\' 3"  (1.6 m), weight 91.445 kg (201 lb 9.6 oz), SpO2 100.00%.  Physical Exam General appearance: alert, cooperative and no distress Head: Normocephalic, without obvious abnormality, atraumatic Lungs: clear to auscultation bilaterally Heart: regular rate and rhythm, S1, S2 normal, no murmur, click, rub or gallop Abdomen: soft, gravid, nontender to palpation Extremities: no edema, redness or tenderness in  the calves or thighs Pulses: 2+ and symmetric DP, PT, radial Skin: warm and dry Reflexes: 1+ patellar and achilles reflexes bilaterally. No clonus.   FHT: 150bpm, mod var, accels present, prolonged decel    Toco: no contractions MAU Course  Procedures Results for orders placed during the hospital encounter of 07/30/12 (from the past 24 hour(s))  URINALYSIS, ROUTINE W REFLEX MICROSCOPIC     Status: None   Collection Time    07/30/12  3:12 PM      Result Value Range   Color, Urine YELLOW  YELLOW   APPearance CLEAR  CLEAR   Specific Gravity, Urine 1.025  1.005 - 1.030   pH 6.5  5.0 - 8.0   Glucose, UA  NEGATIVE  NEGATIVE mg/dL   Hgb urine dipstick NEGATIVE  NEGATIVE   Bilirubin Urine NEGATIVE  NEGATIVE   Ketones, ur NEGATIVE  NEGATIVE mg/dL   Protein, ur NEGATIVE  NEGATIVE mg/dL   Urobilinogen, UA 1.0  0.0 - 1.0 mg/dL   Nitrite NEGATIVE  NEGATIVE   Leukocytes, UA NEGATIVE  NEGATIVE  PROTEIN / CREATININE RATIO, URINE     Status: None   Collection Time    07/30/12  3:12 PM      Result Value Range   Creatinine, Urine 388.54     Total Protein, Urine 31.8     PROTEIN CREATININE RATIO 0.08  0.00 - 0.15  CBC WITH DIFFERENTIAL     Status: Abnormal   Collection Time    07/30/12  4:20 PM      Result Value Range   WBC 7.6  4.0 - 10.5 K/uL   RBC 4.10  3.87 - 5.11 MIL/uL   Hemoglobin 10.6 (*) 12.0 - 15.0 g/dL   HCT 16.1 (*) 09.6 - 04.5 %   MCV 80.0  78.0 - 100.0 fL   MCH 25.9 (*) 26.0 - 34.0 pg   MCHC 32.3  30.0 - 36.0 g/dL   RDW 40.9  81.1 - 91.4 %   Platelets SPECIMEN CHECKED FOR CLOTS  150 - 400 K/uL   Neutrophils Relative % 59  43 - 77 %   Neutro Abs 4.5  1.7 - 7.7 K/uL   Lymphocytes Relative 31  12 - 46 %   Lymphs Abs 2.3  0.7 - 4.0 K/uL   Monocytes Relative 9  3 - 12 %   Monocytes Absolute 0.7  0.1 - 1.0 K/uL   Eosinophils Relative 1  0 - 5 %   Eosinophils Absolute 0.1  0.0 - 0.7 K/uL   Basophils Relative 0  0 - 1 %   Basophils Absolute 0.0  0.0 - 0.1 K/uL  COMPREHENSIVE METABOLIC PANEL     Status: Abnormal   Collection Time    07/30/12  4:20 PM      Result Value Range   Sodium 133 (*) 135 - 145 mEq/L   Potassium 3.8  3.5 - 5.1 mEq/L   Chloride 104  96 - 112 mEq/L   CO2 21  19 - 32 mEq/L   Glucose, Bld 70  70 - 99 mg/dL   BUN 7  6 - 23 mg/dL   Creatinine, Ser 7.82  0.50 - 1.10 mg/dL   Calcium 9.1  8.4 - 95.6 mg/dL   Total Protein 6.9  6.0 - 8.3 g/dL   Albumin 2.4 (*) 3.5 - 5.2 g/dL   AST 10  0 - 37 U/L   ALT 6  0 - 35 U/L   Alkaline Phosphatase 112  39 - 117 U/L   Total Bilirubin 0.3  0.3 - 1.2 mg/dL   GFR calc non Af Amer >90  >90 mL/min   GFR calc Af Amer >90   >90 mL/min    MDM PIH labs BPP  Filed Vitals:   07/30/12 1746 07/30/12 1801 07/30/12 1816 07/30/12 1831  BP: 118/84 119/79 116/80 124/81  Pulse: 72 59 58 63  Temp:      Resp:      Height:      Weight:      SpO2:        Assessment and Plan  27 y.o. G2P0101 at [redacted]w[redacted]d   # Pre-eclampsia workup - PIH labs normal - BPP 8/8 - BPs normal 115-126/71-89 after initial elevated BP - Possibly migraine headache, patient declines fioricet for home med, wants to just use Tylenol.  Stable for discharge  Tawni Carnes 07/30/2012, 4:46 PM   I have seen and examined this patient and agree with above documentation in the resident's note.   Pt presented with blurry vision and headaches and concern for possible pre-E given hx of last pregnancy. BP  Normal after an isolated initial. Labs unremarkable.  Headache with features of migraine and offered to treat but pt does not have a ride home and declined. FHT with a slow shallow prolonged decel vs a change in baseline.  Given no recent [facility] a BPP was obtained and was 8/8.  Given this, reassurance given. Pt to f/u as scheduled in high risk clinic on 8/7.    Rulon Abide, M.D. [facility] Fellow 07/30/2012 7:58 PM

## 2012-08-01 ENCOUNTER — Other Ambulatory Visit: Payer: Self-pay | Admitting: Physician Assistant

## 2012-08-05 NOTE — MAU Provider Note (Signed)
Attestation of Attending Supervision of Advanced Practitioner (CNM/NP): Evaluation and management procedures were performed by the Advanced Practitioner under my supervision and collaboration.  I have reviewed the Advanced Practitioner's note and chart, and I agree with the management and plan.  Ova Meegan 08/05/2012 2:13 PM

## 2012-08-05 NOTE — MAU Provider Note (Signed)
Attestation of Attending Supervision of Advanced Practitioner (CNM/NP): Evaluation and management procedures were performed by the Advanced Practitioner under my supervision and collaboration. I have reviewed the Advanced Practitioner's note and chart, and I agree with the management and plan.  Aayden Cefalu H. 7:54 PM   

## 2012-08-08 ENCOUNTER — Encounter: Payer: Self-pay | Admitting: Obstetrics and Gynecology

## 2012-08-08 ENCOUNTER — Ambulatory Visit (INDEPENDENT_AMBULATORY_CARE_PROVIDER_SITE_OTHER): Payer: Medicaid Other | Admitting: Obstetrics and Gynecology

## 2012-08-08 VITALS — BP 128/88 | Temp 97.2°F | Wt 202.8 lb

## 2012-08-08 DIAGNOSIS — O34219 Maternal care for unspecified type scar from previous cesarean delivery: Secondary | ICD-10-CM

## 2012-08-08 DIAGNOSIS — I10 Essential (primary) hypertension: Secondary | ICD-10-CM

## 2012-08-08 DIAGNOSIS — Z98891 History of uterine scar from previous surgery: Secondary | ICD-10-CM

## 2012-08-08 DIAGNOSIS — O0993 Supervision of high risk pregnancy, unspecified, third trimester: Secondary | ICD-10-CM

## 2012-08-08 DIAGNOSIS — IMO0002 Reserved for concepts with insufficient information to code with codable children: Secondary | ICD-10-CM

## 2012-08-08 DIAGNOSIS — Z23 Encounter for immunization: Secondary | ICD-10-CM

## 2012-08-08 DIAGNOSIS — E0590 Thyrotoxicosis, unspecified without thyrotoxic crisis or storm: Secondary | ICD-10-CM

## 2012-08-08 LAB — POCT URINALYSIS DIP (DEVICE)
Bilirubin Urine: NEGATIVE
Glucose, UA: NEGATIVE mg/dL
Hgb urine dipstick: NEGATIVE
Ketones, ur: NEGATIVE mg/dL
Specific Gravity, Urine: 1.025 (ref 1.005–1.030)

## 2012-08-08 LAB — TSH: TSH: 1.539 u[IU]/mL (ref 0.350–4.500)

## 2012-08-08 MED ORDER — TETANUS-DIPHTH-ACELL PERTUSSIS 5-2.5-18.5 LF-MCG/0.5 IM SUSP
0.5000 mL | Freq: Once | INTRAMUSCULAR | Status: AC
  Administered 2012-08-08: 0.5 mL via INTRAMUSCULAR

## 2012-08-08 NOTE — Progress Notes (Signed)
Pulse- 93  Pressure- lower abd/pelvic Weight gain 11-20lbs New ob packet given  Pt will reschedule to do glucose test was not able to tolerate

## 2012-08-08 NOTE — Patient Instructions (Signed)

## 2012-08-08 NOTE — Progress Notes (Signed)
Dated by limited US at 29>schedule complete US. On therapeutic Lovenox, hx PE> d/w Dr. Shawnie Pons re: return for anti xa 4 hr after dosing. States anat [place] nl. Could not tolerate glucola today.   Subjective:    Debra Horn is a G2P0101 [redacted]w[redacted]d being seen today for her first obstetrical visit.  Her obstetrical history is significant for G1 w/ hx PE 2013 on therapeutic Lovenox, Hx HTN, asthma. . Patient does intend to breast feed. Pregnancy history fully reviewed.  Patient reports no complaints.  Filed Vitals:   08/08/12 1009  BP: 128/88  Temp: 97.2 F (36.2 C)  Weight: 202 lb 12.8 oz (91.989 kg)    HISTORY: OB History   Grav Para Term Preterm Abortions TAB SAB Ect Mult Living   2 1  1      1      # Outc Date GA Lbr Len/2nd Wgt Sex Del Anes PTL Lv   1 PRE 2008 [redacted]w[redacted]d   M CS  Yes Yes   Comments: PreE   2 CUR              Past Medical History  Diagnosis Date  . Asthma   . Preeclampsia   . Hyperthyroidism   . Anxiety   . PE (pulmonary embolism) 11/2011  . Preeclampsia   . Pregnancy induced hypertension   . Preterm labor   . Pulmonary embolism Nov 2013   Past Surgical History  Procedure Laterality Date  . Cesarean section     Family History  Problem Relation Age of Onset  . Hypothyroidism Maternal Aunt   . Hypertension Maternal Grandfather   . Hypertension Paternal Grandmother      Exam                                      System: Breast:  normal appearance, no masses or tenderness, Inspection negative, No nipple retraction or dimpling   Skin: normal coloration and turgor, no rashes    Neurologic: oriented, normal, grossly non-focal   Extremities: normal strength, tone, and muscle mass   HEENT PERRLA, thyroid without masses and trachea midline   Mouth/Teeth mucous membranes moist, pharynx normal without lesions   Neck supple and no masses   Cardiovascular: regular rate and rhythm, no murmurs or gallops   Respiratory:  appears well, vitals normal, no  respiratory distress, acyanotic, normal RR, ear and throat exam is normal, neck free of mass or lymphadenopathy, chest clear, no wheezing, crepitations, rhonchi, normal symmetric air entry   Abdomen: soft, NT, S=D FHR 140, FH 30          Assessment:    Pregnancy: G2P0101 Patient Active Problem List   Diagnosis Date Noted  . Preterm NB deliv by C-section, 1,750-1,999 gm, 31-32 completed weeks 08/08/2012  . Hx pulmonary embolism 07/24/2012  . Supervision of high-risk pregnancy 07/24/2012        Plan:    Return for anti x-a 4 hr after dosing Initial labs pending from [place] [facility]. Problem list reviewed and updated.  Follow up in 1 weeks. 50% of 30 min visit spent on counseling and coordination of care.  ROI PN records and op note  Return next week for anti factor xa Virgel Haro 08/08/2012

## 2012-08-12 ENCOUNTER — Encounter: Payer: Commercial Managed Care - PPO | Admitting: Obstetrics & Gynecology

## 2012-08-12 ENCOUNTER — Encounter: Payer: Self-pay | Admitting: *Deleted

## 2012-08-15 ENCOUNTER — Encounter: Payer: Self-pay | Admitting: Obstetrics and Gynecology

## 2012-10-24 ENCOUNTER — Ambulatory Visit: Payer: Commercial Managed Care - PPO | Admitting: Obstetrics

## 2012-10-28 ENCOUNTER — Emergency Department (HOSPITAL_COMMUNITY)
Admission: EM | Admit: 2012-10-28 | Discharge: 2012-10-28 | Disposition: A | Discharge status: Home / Self Care | Payer: Medicaid Other | Attending: Emergency Medicine | Admitting: Emergency Medicine

## 2012-10-28 ENCOUNTER — Emergency Department (HOSPITAL_COMMUNITY): Payer: Medicaid Other

## 2012-10-28 ENCOUNTER — Encounter (HOSPITAL_COMMUNITY): Payer: Self-pay | Admitting: Emergency Medicine

## 2012-10-28 DIAGNOSIS — Z79899 Other long term (current) drug therapy: Secondary | ICD-10-CM | POA: Insufficient documentation

## 2012-10-28 DIAGNOSIS — IMO0002 Reserved for concepts with insufficient information to code with codable children: Secondary | ICD-10-CM | POA: Insufficient documentation

## 2012-10-28 DIAGNOSIS — J45909 Unspecified asthma, uncomplicated: Secondary | ICD-10-CM | POA: Insufficient documentation

## 2012-10-28 DIAGNOSIS — R0789 Other chest pain: Secondary | ICD-10-CM | POA: Insufficient documentation

## 2012-10-28 DIAGNOSIS — E0590 Thyrotoxicosis, unspecified without thyrotoxic crisis or storm: Secondary | ICD-10-CM | POA: Insufficient documentation

## 2012-10-28 DIAGNOSIS — Z86711 Personal history of pulmonary embolism: Secondary | ICD-10-CM | POA: Insufficient documentation

## 2012-10-28 DIAGNOSIS — F411 Generalized anxiety disorder: Secondary | ICD-10-CM | POA: Insufficient documentation

## 2012-10-28 LAB — CBC WITH DIFFERENTIAL/PLATELET
Basophils Relative: 0 % (ref 0–1)
Eosinophils Absolute: 0.2 10*3/uL (ref 0.0–0.7)
HCT: 31.9 % — ABNORMAL LOW (ref 36.0–46.0)
MCHC: 31.3 g/dL (ref 30.0–36.0)
MCV: 83.1 fL (ref 78.0–100.0)
Monocytes Relative: 7 % (ref 3–12)
Neutrophils Relative %: 45 % (ref 43–77)
Platelets: 355 10*3/uL (ref 150–400)
RBC: 3.84 MIL/uL — ABNORMAL LOW (ref 3.87–5.11)
WBC: 8.7 10*3/uL (ref 4.0–10.5)

## 2012-10-28 LAB — BASIC METABOLIC PANEL
BUN: 18 mg/dL (ref 6–23)
GFR calc Af Amer: >90 mL/min (ref >90)
GFR calc non Af Amer: >90 mL/min (ref >90)
Potassium: 3.7 mEq/L (ref 3.5–5.1)

## 2012-10-28 MED ORDER — IOHEXOL 350 MG/ML SOLN
100.0000 mL | Freq: Once | INTRAVENOUS | Status: AC | PRN
  Administered 2012-10-28: 100 mL via INTRAVENOUS

## 2012-10-28 MED ORDER — HYDROCODONE-ACETAMINOPHEN 5-325 MG PO TABS: 1.0000 | ORAL_TABLET | Freq: Four times a day (QID) | ORAL | Status: DC | PRN

## 2012-10-28 MED ORDER — PANTOPRAZOLE SODIUM 40 MG IV SOLR
40.0000 mg | Freq: Once | INTRAVENOUS | Status: AC
  Administered 2012-10-28: 40 mg via INTRAVENOUS
  Filled 2012-10-28: qty 40

## 2012-10-28 MED ORDER — OMEPRAZOLE 20 MG PO CPDR: 20.0000 mg | DELAYED_RELEASE_CAPSULE | Freq: Every day | ORAL | Status: DC

## 2012-10-28 MED ORDER — SODIUM CHLORIDE 0.9 % IV SOLN
INTRAVENOUS | Status: DC
  Administered 2012-10-28: 05:00:00 via INTRAVENOUS

## 2012-10-28 NOTE — ED Notes (Addendum)
Pt c/o central chest pain radiating to R chest and L axillary intermittent x 2 weeks. Pt stopped taking Lovenox from PE 11/13. +SHOB, +dizziness, pt is 2 mo post partum.

## 2012-10-28 NOTE — ED Provider Notes (Addendum)
CSN: [id number]     Arrival date & time 10/28/12  0407 History   First MD Initiated Contact with Patient 10/28/12 [phone]     Chief Complaint  Patient presents with  . Chest Pain   (Consider location/radiation/quality/duration/timing/severity/associated sxs/prior Treatment) HPI This is a 27 year old female who was diagnosed with a pulmonary embolism in November of last year. She discontinued her Lovenox about 4 weeks ago. She is 2 months postpartum. She is here with the two-week history of chest pain. The chest pain is described as pinching in nature. Its location wanders from time to time but is primarily in the right upper chest and armpits. It is intermittent. It is sometimes worse with movement and deep breathing. She has been getting short of breath at times but has asthma. She states the pain acutely worsened yesterday after having her first alcoholic drink in a long time. She denies nausea, vomiting or diarrhea.  Past Medical History  Diagnosis Date  . Asthma   . Preeclampsia   . Hyperthyroidism   . Anxiety   . PE (pulmonary embolism) 11/2011  . Preeclampsia   . Pregnancy induced hypertension   . Preterm labor   . Pulmonary embolism Nov 2013   Past Surgical History  Procedure Laterality Date  . Cesarean section     Family History  Problem Relation Age of Onset  . Hypothyroidism Maternal Aunt   . Hypertension Maternal Grandfather   . Hypertension Paternal Grandmother    History  Substance Use Topics  . Smoking status: Never Smoker   . Smokeless tobacco: Never Used  . Alcohol Use: Yes     Comment: occasional   OB History   Grav Para Term Preterm Abortions TAB SAB Ect Mult Living   2 1  1      1      Review of Systems  All other systems reviewed and are negative.    Allergies  Cefixime  Home Medications   Current Outpatient Rx  Name  Route  Sig  Dispense  Refill  . albuterol (VENTOLIN HFA) 108 (90 BASE) MCG/ACT inhaler   Inhalation   Inhale 1-2 puffs into  the lungs every 6 (six) hours as needed for wheezing.   1 Inhaler   0     Needs office visit for further refills   . beclomethasone (QVAR) 40 MCG/ACT inhaler   Inhalation   Inhale 2 puffs into the lungs 2 (two) times daily.         Marland Kitchen ibuprofen (ADVIL,MOTRIN) 200 MG tablet   Oral   Take 600 mg by mouth every 8 (eight) hours as needed for pain.         Marland Kitchen enoxaparin (LOVENOX) 80 MG/0.8ML injection   Subcutaneous   Inject 75 mg into the skin every 12 (twelve) hours.          BP 129/70  Pulse 74  Temp(Src) 97.9 F (36.6 C) (Oral)  Resp 18  Ht 5\' 3"  (1.6 m)  Wt 197 lb (89.359 kg)  BMI 34.91 kg/m2  SpO2 98%  Breastfeeding? No  Physical Exam General: Well-developed, well-nourished female in no acute distress; appearance consistent with age of record HENT: normocephalic; atraumatic Eyes: pupils equal, round and reactive to light; extraocular muscles intact Neck: supple Heart: regular rate and rhythm; no murmurs, rubs or gallops  Lungs: clear to auscultation bilaterally Chest: Nontender Abdomen: soft; nondistended; nontender; bowel sounds present Extremities: No deformity; full range of motion; pulses normal; no edema; no calf tenderness Neurologic: Awake,  alert and oriented; motor function intact in all extremities and symmetric; no facial droop Skin: Warm and dry Psychiatric: Normal mood and affect    ED Course  Procedures (including critical care time)    MDM   Nursing notes and vitals signs, including pulse oximetry, reviewed.  Summary of this visit's results, reviewed by myself:  Labs:  Results for orders placed during the hospital encounter of 10/28/12 (from the past 24 hour(s))  CBC WITH DIFFERENTIAL     Status: Abnormal   Collection Time    10/28/12  4:40 AM      Result Value Range   WBC 8.7  4.0 - 10.5 K/uL   RBC 3.84 (*) 3.87 - 5.11 MIL/uL   Hemoglobin 10.0 (*) 12.0 - 15.0 g/dL   HCT 16.1 (*) 09.6 - 04.5 %   MCV 83.1  78.0 - 100.0 fL   MCH  26.0  26.0 - 34.0 pg   MCHC 31.3  30.0 - 36.0 g/dL   RDW 40.9 (*) 81.1 - 91.4 %   Platelets 355  150 - 400 K/uL   Neutrophils Relative % 45  43 - 77 %   Neutro Abs 3.9  1.7 - 7.7 K/uL   Lymphocytes Relative 46  12 - 46 %   Lymphs Abs 4.0  0.7 - 4.0 K/uL   Monocytes Relative 7  3 - 12 %   Monocytes Absolute 0.6  0.1 - 1.0 K/uL   Eosinophils Relative 2  0 - 5 %   Eosinophils Absolute 0.2  0.0 - 0.7 K/uL   Basophils Relative 0  0 - 1 %   Basophils Absolute 0.0  0.0 - 0.1 K/uL  BASIC METABOLIC PANEL     Status: None   Collection Time    10/28/12  4:40 AM      Result Value Range   Sodium 135  135 - 145 mEq/L   Potassium 3.7  3.5 - 5.1 mEq/L   Chloride 101  96 - 112 mEq/L   CO2 25  19 - 32 mEq/L   Glucose, Bld 90  70 - 99 mg/dL   BUN 18  6 - 23 mg/dL   Creatinine, Ser 7.82  0.50 - 1.10 mg/dL   Calcium 9.4  8.4 - 95.6 mg/dL   GFR calc non Af Amer >90  >90 mL/min   GFR calc Af Amer >90  >90 mL/min  TROPONIN I     Status: None   Collection Time    10/28/12  4:40 AM      Result Value Range   Troponin I <0.30  <0.30 ng/mL    Imaging Studies: Ct Angio Chest Pe W/cm &/or Wo Cm  10/28/2012   CLINICAL DATA:  Chest pain. History pulmonary embolism.  EXAM: CT ANGIOGRAPHY CHEST WITH CONTRAST  TECHNIQUE: Multidetector CT imaging of the chest was performed using the standard protocol during bolus administration of intravenous contrast. Multiplanar CT image reconstructions including MIPs were obtained to evaluate the vascular anatomy.  CONTRAST:  OMNIPAQUE IOHEXOL 350 MG/ML SOLN  COMPARISON:  06/20/2006  FINDINGS: THORACIC INLET/BODY WALL:  No acute abnormality.  MEDIASTINUM:  Normal heart size. No pericardial effusion. No acute vascular abnormality. Although the examination is adequate, there is decreased sensitivity due to bolus dispersion and cardiac motion. No adenopathy.  LUNG WINDOWS:  No consolidation.  No effusion.  No suspicious pulmonary nodule.  UPPER ABDOMEN:  No acute findings.   OSSEOUS:  No acute fracture.  No suspicious lytic or blastic  lesions.  Review of the MIP images confirms the above findings.  IMPRESSION: No evidence of pulmonary embolism or other acute intrathoracic disease.   Electronically Signed   By: Tiburcio Pea M.D.   On: 10/28/2012 06:56      EKG Interpretation     Ventricular Rate:  81 PR Interval:  127 QRS Duration: 81 QT Interval:  385 QTC Calculation: 447 R Axis:   73 Text Interpretation:  EKG WITHIN NORMAL LIMITS PR interval longer than on previous EKG     The patient's pain is atypical for cardiac disease and she is low risk apart from her history of pulmonary embolism. That her pain got worse with drinking alcohol suggests an esophageal etiology. We will start her on a PPI.   Hanley Seamen, MD 10/28/12 4782  Hanley Seamen, MD 10/28/12 [phone]

## 2012-11-07 ENCOUNTER — Other Ambulatory Visit: Payer: Self-pay

## 2012-12-05 ENCOUNTER — Emergency Department (HOSPITAL_COMMUNITY)
Admission: EM | Admit: 2012-12-05 | Discharge: 2012-12-06 | Disposition: A | Discharge status: Home / Self Care | Payer: Medicaid Other | Attending: Emergency Medicine | Admitting: Emergency Medicine

## 2012-12-05 ENCOUNTER — Emergency Department (HOSPITAL_COMMUNITY): Payer: Medicaid Other

## 2012-12-05 ENCOUNTER — Encounter (HOSPITAL_COMMUNITY): Payer: Self-pay | Admitting: Emergency Medicine

## 2012-12-05 ENCOUNTER — Other Ambulatory Visit: Payer: Self-pay

## 2012-12-05 DIAGNOSIS — Z79899 Other long term (current) drug therapy: Secondary | ICD-10-CM | POA: Insufficient documentation

## 2012-12-05 DIAGNOSIS — Z8639 Personal history of other endocrine, nutritional and metabolic disease: Secondary | ICD-10-CM | POA: Insufficient documentation

## 2012-12-05 DIAGNOSIS — Z862 Personal history of diseases of the blood and blood-forming organs and certain disorders involving the immune mechanism: Secondary | ICD-10-CM | POA: Insufficient documentation

## 2012-12-05 DIAGNOSIS — J45901 Unspecified asthma with (acute) exacerbation: Secondary | ICD-10-CM | POA: Insufficient documentation

## 2012-12-05 DIAGNOSIS — Z8659 Personal history of other mental and behavioral disorders: Secondary | ICD-10-CM | POA: Insufficient documentation

## 2012-12-05 DIAGNOSIS — R079 Chest pain, unspecified: Secondary | ICD-10-CM

## 2012-12-05 DIAGNOSIS — Z86718 Personal history of other venous thrombosis and embolism: Secondary | ICD-10-CM | POA: Insufficient documentation

## 2012-12-05 DIAGNOSIS — R0602 Shortness of breath: Secondary | ICD-10-CM

## 2012-12-05 DIAGNOSIS — R071 Chest pain on breathing: Secondary | ICD-10-CM | POA: Insufficient documentation

## 2012-12-05 LAB — BASIC METABOLIC PANEL
GFR calc Af Amer: >90 mL/min (ref >90)
GFR calc non Af Amer: >90 mL/min (ref >90)
Potassium: 3.9 mEq/L (ref 3.5–5.1)
Sodium: 137 mEq/L (ref 135–145)

## 2012-12-05 MED ORDER — ONDANSETRON HCL 4 MG/2ML IJ SOLN
4.0000 mg | Freq: Once | INTRAMUSCULAR | Status: DC
  Filled 2012-12-05: qty 2

## 2012-12-05 MED ORDER — MORPHINE SULFATE 4 MG/ML IJ SOLN
4.0000 mg | Freq: Once | INTRAMUSCULAR | Status: AC
  Administered 2012-12-05: 4 mg via INTRAVENOUS
  Filled 2012-12-05: qty 1

## 2012-12-05 NOTE — ED Notes (Signed)
Patient is alert and oriented x3.  She is complaining of chest pain that she states has been on going for the last week. She states the pain is sharp and stabbing in the mid to left of her chest that causes shortness of breath.  Currently  She rates her pain 8 of 10.

## 2012-12-06 LAB — CBC WITH DIFFERENTIAL/PLATELET
Basophils Absolute: 0 10*3/uL (ref 0.0–0.1)
Basophils Relative: 0 % (ref 0–1)
Eosinophils Absolute: 0.2 10*3/uL (ref 0.0–0.7)
Lymphs Abs: 4.1 10*3/uL — ABNORMAL HIGH (ref 0.7–4.0)
MCH: 26.6 pg (ref 26.0–34.0)
MCHC: 32.2 g/dL (ref 30.0–36.0)
Neutrophils Relative %: 46 % (ref 43–77)
Platelets: 383 10*3/uL (ref 150–400)
RBC: 3.72 MIL/uL — ABNORMAL LOW (ref 3.87–5.11)
RDW: 16.2 % — ABNORMAL HIGH (ref 11.5–15.5)

## 2012-12-06 MED ORDER — CYCLOBENZAPRINE HCL 10 MG PO TABS: 10.0000 mg | ORAL_TABLET | Freq: Two times a day (BID) | ORAL | Status: DC | PRN

## 2012-12-06 MED ORDER — TRAMADOL HCL 50 MG PO TABS: 50.0000 mg | ORAL_TABLET | Freq: Four times a day (QID) | ORAL | Status: DC | PRN

## 2012-12-06 NOTE — ED Notes (Signed)
D/t pt receiving IV morphine, pt cannot drive herself home.  Pt does not have an alternate way to get home at this time. Charge nurse made aware.

## 2012-12-06 NOTE — ED Notes (Signed)
Pt is A&O x3, no longer feels drowsy.

## 2012-12-06 NOTE — ED Provider Notes (Signed)
Medical screening examination/treatment/procedure(s) were performed by non-physician practitioner and as supervising physician I was immediately available for consultation/collaboration.  EKG Interpretation   None         Taleyah Hillman L Tobiah Celestine, MD 12/06/12 2248 

## 2012-12-06 NOTE — ED Provider Notes (Signed)
CSN: [id number]     Arrival date & time 12/05/12  2237 History   First MD Initiated Contact with Patient 12/05/12 2309     Chief Complaint  Patient presents with  . Chest Pain   (Consider location/radiation/quality/duration/timing/severity/associated sxs/prior Treatment) HPI Comments: Patient is a 27 year old female with a past medical history of PE who presents with chest pain for the past week that became acutely worse since yesterday. Symptoms started gradually and progressively worsened since the onset. The pain is sharp and located in her left chest. The pain is pleuritic and severe. Patient reports associated SOB. No aggravating/alleviating factors. She has not tried anything at home for pain. No other associated symptoms. Patient frequents the ED for chest pain complaints.    Past Medical History  Diagnosis Date  . Asthma   . Preeclampsia   . Hyperthyroidism   . Anxiety   . PE (pulmonary embolism) 11/2011  . Preeclampsia   . Pregnancy induced hypertension   . Preterm labor   . Pulmonary embolism Nov 2013   Past Surgical History  Procedure Laterality Date  . Cesarean section     Family History  Problem Relation Age of Onset  . Hypothyroidism Maternal Aunt   . Hypertension Maternal Grandfather   . Hypertension Paternal Grandmother    History  Substance Use Topics  . Smoking status: Never Smoker   . Smokeless tobacco: Never Used  . Alcohol Use: Yes     Comment: occasional   OB History   Grav Para Term Preterm Abortions TAB SAB Ect Mult Living   2 1  1      1      Review of Systems  Constitutional: Negative for fever, chills and fatigue.  HENT: Negative for trouble swallowing.   Eyes: Negative for visual disturbance.  Respiratory: Positive for shortness of breath.   Cardiovascular: Positive for chest pain. Negative for palpitations.  Gastrointestinal: Negative for nausea, vomiting, abdominal pain and diarrhea.  Genitourinary: Negative for dysuria and difficulty  urinating.  Musculoskeletal: Negative for arthralgias and neck pain.  Skin: Negative for color change.  Neurological: Negative for dizziness and weakness.  Psychiatric/Behavioral: Negative for dysphoric mood.    Allergies  Cefixime  Home Medications   Current Outpatient Rx  Name  Route  Sig  Dispense  Refill  . albuterol (VENTOLIN HFA) 108 (90 BASE) MCG/ACT inhaler   Inhalation   Inhale 1-2 puffs into the lungs every 6 (six) hours as needed for wheezing.   1 Inhaler   0     Needs office visit for further refills   . beclomethasone (QVAR) 40 MCG/ACT inhaler   Inhalation   Inhale 2 puffs into the lungs 2 (two) times daily.         Marland Kitchen ibuprofen (ADVIL,MOTRIN) 200 MG tablet   Oral   Take 600 mg by mouth every 8 (eight) hours as needed for pain.          BP 99/61  Pulse 79  Temp(Src) 98.6 F (37 C) (Oral)  Resp 14  SpO2 100%  LMP 11/21/2012 Physical Exam  Nursing note and vitals reviewed. Constitutional: She is oriented to person, place, and time. She appears well-developed and well-nourished. No distress.  HENT:  Head: Normocephalic and atraumatic.  Eyes: Conjunctivae and EOM are normal.  Neck: Normal range of motion.  Cardiovascular: Normal rate and regular rhythm.  Exam reveals no gallop and no friction rub.   No murmur heard. Pulmonary/Chest: Effort normal and breath sounds normal.  She has no wheezes. She has no rales. She exhibits no tenderness.  Abdominal: Soft. She exhibits no distension. There is no tenderness. There is no rebound and no guarding.  Musculoskeletal: Normal range of motion.  Neurological: She is alert and oriented to person, place, and time. Coordination normal.  Speech is goal-oriented. Moves limbs without ataxia.   Skin: Skin is warm and dry.  Psychiatric: She has a normal mood and affect. Her behavior is normal.    ED Course  Procedures (including critical care time) Labs Review Labs Reviewed  CBC WITH DIFFERENTIAL - Abnormal;  Notable for the following:    RBC 3.72 (*)    Hemoglobin 9.9 (*)    HCT 30.7 (*)    RDW 16.2 (*)    Lymphs Abs 4.1 (*)    All other components within normal limits  BASIC METABOLIC PANEL  D-DIMER, QUANTITATIVE   Imaging Review Dg Chest 2 View  12/06/2012   CLINICAL DATA:  Chest pain and shortness of breath.  EXAM: CHEST  2 VIEW  COMPARISON:  06/27/2011  FINDINGS: The heart size and mediastinal contours are within normal limits. Both lungs are clear. The visualized skeletal structures are unremarkable.  IMPRESSION: No active cardiopulmonary disease.   Electronically Signed   By: Jeronimo Greaves M.D.   On: 12/06/2012 00:14    EKG Interpretation   None       MDM   1. Chest pain   2. SOB (shortness of breath)     12:46 AM Labs unremarkable for acute changes. Chest xray unremarkable. D-dimer negative. Patient given pain medication. Vitals stable and patient afebrile. Patient was screened with a d-dimer test due to frequent visits to the ED. This plan was discussed with Dr. Read Drivers.     Emilia Beck, PA-C 12/06/12 1043

## 2012-12-17 ENCOUNTER — Emergency Department (HOSPITAL_COMMUNITY)
Admission: EM | Admit: 2012-12-17 | Discharge: 2012-12-17 | Disposition: A | Discharge status: Home / Self Care | Payer: Medicaid Other | Attending: Emergency Medicine | Admitting: Emergency Medicine

## 2012-12-17 ENCOUNTER — Encounter (HOSPITAL_COMMUNITY): Payer: Self-pay | Admitting: Emergency Medicine

## 2012-12-17 DIAGNOSIS — IMO0002 Reserved for concepts with insufficient information to code with codable children: Secondary | ICD-10-CM | POA: Insufficient documentation

## 2012-12-17 DIAGNOSIS — R079 Chest pain, unspecified: Secondary | ICD-10-CM | POA: Insufficient documentation

## 2012-12-17 DIAGNOSIS — J45909 Unspecified asthma, uncomplicated: Secondary | ICD-10-CM | POA: Insufficient documentation

## 2012-12-17 DIAGNOSIS — R05 Cough: Secondary | ICD-10-CM | POA: Insufficient documentation

## 2012-12-17 DIAGNOSIS — Z8679 Personal history of other diseases of the circulatory system: Secondary | ICD-10-CM | POA: Insufficient documentation

## 2012-12-17 DIAGNOSIS — Z8659 Personal history of other mental and behavioral disorders: Secondary | ICD-10-CM | POA: Insufficient documentation

## 2012-12-17 DIAGNOSIS — Z8639 Personal history of other endocrine, nutritional and metabolic disease: Secondary | ICD-10-CM | POA: Insufficient documentation

## 2012-12-17 DIAGNOSIS — R059 Cough, unspecified: Secondary | ICD-10-CM | POA: Insufficient documentation

## 2012-12-17 DIAGNOSIS — Z86711 Personal history of pulmonary embolism: Secondary | ICD-10-CM | POA: Insufficient documentation

## 2012-12-17 DIAGNOSIS — Z862 Personal history of diseases of the blood and blood-forming organs and certain disorders involving the immune mechanism: Secondary | ICD-10-CM | POA: Insufficient documentation

## 2012-12-17 DIAGNOSIS — Z8751 Personal history of pre-term labor: Secondary | ICD-10-CM | POA: Insufficient documentation

## 2012-12-17 DIAGNOSIS — Z8742 Personal history of other diseases of the female genital tract: Secondary | ICD-10-CM | POA: Insufficient documentation

## 2012-12-17 DIAGNOSIS — Z79899 Other long term (current) drug therapy: Secondary | ICD-10-CM | POA: Insufficient documentation

## 2012-12-17 LAB — CBC
Hemoglobin: 10 g/dL — ABNORMAL LOW (ref 12.0–15.0)
MCH: 26.7 pg (ref 26.0–34.0)
MCHC: 32.4 g/dL (ref 30.0–36.0)
MCV: 82.6 fL (ref 78.0–100.0)
Platelets: 407 10*3/uL — ABNORMAL HIGH (ref 150–400)
RBC: 3.74 MIL/uL — ABNORMAL LOW (ref 3.87–5.11)

## 2012-12-17 LAB — POCT I-STAT TROPONIN I

## 2012-12-17 LAB — D-DIMER, QUANTITATIVE (NOT AT ARMC): D-Dimer, Quant: 0.41 ug/mL-FEU (ref 0.00–0.48)

## 2012-12-17 LAB — BASIC METABOLIC PANEL
CO2: 22 mEq/L (ref 19–32)
Calcium: 8.9 mg/dL (ref 8.4–10.5)
Creatinine, Ser: 0.7 mg/dL (ref 0.50–1.10)
GFR calc non Af Amer: >90 mL/min (ref >90)
Glucose, Bld: 95 mg/dL (ref 70–99)
Sodium: 134 mEq/L — ABNORMAL LOW (ref 135–145)

## 2012-12-17 MED ORDER — ASPIRIN 81 MG PO CHEW
324.0000 mg | CHEWABLE_TABLET | Freq: Once | ORAL | Status: AC
  Administered 2012-12-17: 324 mg via ORAL
  Filled 2012-12-17: qty 4

## 2012-12-17 MED ORDER — OMEPRAZOLE 20 MG PO CPDR: 20.0000 mg | DELAYED_RELEASE_CAPSULE | Freq: Every day | ORAL | Status: DC

## 2012-12-17 MED ORDER — IBUPROFEN 800 MG PO TABS: 800.0000 mg | ORAL_TABLET | Freq: Three times a day (TID) | ORAL | Status: DC

## 2012-12-17 MED ORDER — GI COCKTAIL ~~LOC~~
30.0000 mL | Freq: Once | ORAL | Status: AC
  Administered 2012-12-17: 30 mL via ORAL
  Filled 2012-12-17: qty 30

## 2012-12-17 NOTE — ED Notes (Signed)
Pt. sts she has craved Dial soap Since first pregnancy. Admits to licking dial soap bar today.

## 2012-12-17 NOTE — ED Provider Notes (Signed)
Medical screening examination/treatment/procedure(s) were performed by non-physician practitioner and as supervising physician I was immediately available for consultation/collaboration.    Sunnie Nielsen, MD 12/17/12 [phone]

## 2012-12-17 NOTE — ED Provider Notes (Signed)
CSN: [id number]     Arrival date & time 12/17/12  0358 History   First MD Initiated Contact with Patient 12/17/12 0455     Chief Complaint  Patient presents with  . Chest Pain   HPI  History provided by the patient. Patient is a 27 year old female with previous history of pulmonary embolism who returns to the emergency room with complaints of left chest pain. She was seen in earlier this month on December 4 for similar complaints of chest pain. Today she reports pain in her left chest area different from prior pain. Pain radiates slightly into left arm. It is worse with certain movements and with palpation of the chest area. Symptoms occurred while at rest at home around 4 AM. She denies any associated nausea, vomiting, lightheadedness or diaphoresis. He does report some recent slight cough nonproductive. Denies any hemoptysis. No recent travel. No pain or swelling in legs or arms. No pleuritic chest pain. Patient does also report having a craving since a recent pregnancy for bowel soap. She does admit to eating increased amounts of bowel bar soap. Denies any increased belching or acid reflux symptoms. No other aggravating or alleviating factors. No other associated symptoms.    Past Medical History  Diagnosis Date  . Asthma   . Preeclampsia   . Hyperthyroidism   . Anxiety   . PE (pulmonary embolism) 11/2011  . Preeclampsia   . Pregnancy induced hypertension   . Preterm labor   . Pulmonary embolism Nov 2013   Past Surgical History  Procedure Laterality Date  . Cesarean section     Family History  Problem Relation Age of Onset  . Hypothyroidism Maternal Aunt   . Hypertension Maternal Grandfather   . Hypertension Paternal Grandmother    History  Substance Use Topics  . Smoking status: Never Smoker   . Smokeless tobacco: Never Used  . Alcohol Use: No     Comment: occasional   OB History   Grav Para Term Preterm Abortions TAB SAB Ect Mult Living   2 1  1      1      Review  of Systems  Constitutional: Negative for fever and chills.  Respiratory: Positive for cough. Negative for shortness of breath.   Cardiovascular: Positive for chest pain. Negative for palpitations and leg swelling.  Gastrointestinal: Negative for nausea, vomiting, abdominal pain, diarrhea and constipation.  All other systems reviewed and are negative.    Allergies  Cefixime  Home Medications   Current Outpatient Rx  Name  Route  Sig  Dispense  Refill  . albuterol (VENTOLIN HFA) 108 (90 BASE) MCG/ACT inhaler   Inhalation   Inhale 1-2 puffs into the lungs every 6 (six) hours as needed for wheezing.   1 Inhaler   0     Needs office visit for further refills   . beclomethasone (QVAR) 40 MCG/ACT inhaler   Inhalation   Inhale 2 puffs into the lungs 2 (two) times daily.         . diphenhydrAMINE (BENADRYL) 25 MG tablet   Oral   Take 25 mg by mouth every 6 (six) hours as needed for allergies.          BP 138/92  Pulse 92  Temp(Src) 97.8 F (36.6 C) (Oral)  Resp 18  SpO2 98%  LMP 11/21/2012 Physical Exam  Nursing note and vitals reviewed. Constitutional: She is oriented to person, place, and time. She appears well-developed and well-nourished. No distress.  HENT:  Head:  Normocephalic and atraumatic.  Cardiovascular: Normal rate and regular rhythm.   Pulmonary/Chest: Effort normal and breath sounds normal. No respiratory distress. She has no wheezes. She has no rales. She exhibits tenderness. She exhibits no crepitus, no edema and no deformity.    Abdominal: Soft. There is no tenderness. There is no rebound.  Musculoskeletal: Normal range of motion. She exhibits no edema and no tenderness.  No clinical signs for DVT  Neurological: She is alert and oriented to person, place, and time.  Skin: Skin is warm and dry. No rash noted.  Psychiatric: She has a normal mood and affect. Her behavior is normal.    ED Course  Procedures   DIAGNOSTIC STUDIES: Oxygen Saturation  is 98% on room air.    COORDINATION OF CARE:  Nursing notes reviewed. Vital signs reviewed. Initial pt interview and examination performed.   5:52 AM-patient seen and evaluated. She is well-appearing in no acute distress. Normal respirations O2 sats. Normal heart rate. Denies shortness of breath to me. Symptoms do not feel like previous PE. She is otherwise low risk. Was recently worked up on December 4 for chest pain with negative d-dimer. Discussed work up plan with pt at bedside, which includes labs ecg. Pt agrees with plan.  Pt discussed in sign out with Francee Piccolo PA-C. She will follow up with d-dimer.  Results for orders placed during the hospital encounter of 12/17/12  CBC      Result Value Range   WBC 8.8  4.0 - 10.5 K/uL   RBC 3.74 (*) 3.87 - 5.11 MIL/uL   Hemoglobin 10.0 (*) 12.0 - 15.0 g/dL   HCT 11.9 (*) 14.7 - 82.9 %   MCV 82.6  78.0 - 100.0 fL   MCH 26.7  26.0 - 34.0 pg   MCHC 32.4  30.0 - 36.0 g/dL   RDW 56.2 (*) 13.0 - 86.5 %   Platelets 407 (*) 150 - 400 K/uL  BASIC METABOLIC PANEL      Result Value Range   Sodium 134 (*) 135 - 145 mEq/L   Potassium 4.0  3.5 - 5.1 mEq/L   Chloride 103  96 - 112 mEq/L   CO2 22  19 - 32 mEq/L   Glucose, Bld 95  70 - 99 mg/dL   BUN 17  6 - 23 mg/dL   Creatinine, Ser 7.84  0.50 - 1.10 mg/dL   Calcium 8.9  8.4 - 69.6 mg/dL   GFR calc non Af Amer >90  >90 mL/min   GFR calc Af Amer >90  >90 mL/min  POCT I-STAT TROPONIN I      Result Value Range   Troponin i, poc 0.00  0.00 - 0.08 ng/mL   Comment 3               Imaging Review No results found.   EKG Interpretation   None      Date: 12/17/2012  Rate: 86  Rhythm: normal sinus rhythm  QRS Axis: normal  Intervals: normal  ST/T Wave abnormalities: normal  Conduction Disutrbances:none  Narrative Interpretation:   Old EKG Reviewed: unchanged    MDM  No diagnosis found.     Angus Seller, PA-C 12/17/12 [phone]

## 2012-12-17 NOTE — ED Provider Notes (Signed)
Filed Vitals:   12/17/12 0811  BP: 136/78  Pulse: 90  Temp:   Resp: 20    Medications  aspirin chewable tablet 324 mg (324 mg Oral Given 12/17/12 0620)  gi cocktail (Maalox,Lidocaine,Donnatal) (30 mLs Oral Given 12/17/12 5366)    D-dimer, quantitative (Final result)   Component (Lab Inquiry)      Collection Time Result Time D-Dimer, Quant    12/17/12 07:00:00 12/17/12 07:21:10 0.41        AT THE INHOUSE ESTABLISHED CUTOFF VALUE OF 0.48 ug/mL FEU, THIS ASSAY HAS BEEN DOCUMENTED IN THE LITERATURE TO HAVE A SENSITIVITY AND NEGATIVE PREDICTIVE VALUE OF AT LEAST 98 TO 99%.  THE TEST RESULT SHOULD BE CORRELATED WITH AN ASSESSMENT OF THE CLINICAL PROBABILITY OF DVT / VTE.                   POCT i-Stat troponin I (Final result)   Component (Lab Inquiry)      Collection Time Result Time Troponin i, poc Comment 3    12/17/12 05:47:00 12/17/12 05:55:00 0.00        Due to the release kinetics of cTnI, a negative result within the first hours of the onset of symptoms does not rule out myocardial infarction with certainty. If myocardial infarction is still suspected, repeat the test at appropriate intervals.                   CBC (Final result) Abnormal Component (Lab Inquiry)      Collection Time Result Time WBC RBC HGB HCT MCV    12/17/12 05:35:00 12/17/12 05:55:00 8.8 3.74 (L) 10.0 (L) 30.9 (L) 82.6         Collection Time Result Time MCH MCHC RDW PLT    12/17/12 05:35:00 12/17/12 05:55:00 26.7 32.4 16.0 (H) 407 (H)                   Basic metabolic panel (Final result) Abnormal Component (Lab Inquiry)      Collection Time Result Time NA K CL CO2 GLUCOSE    12/17/12 05:35:00 12/17/12 06:04:52 134 (L) 4.0 103 22 95         Collection Time Result Time BUN Creatinine, Ser CALCIUM GFR calc non Af Amer GFR calc Af Amer    12/17/12 05:35:00 12/17/12 06:04:52 17 0.70 8.9 >90 >90 (NOTE) The eGFR has been calculated using the CKD EPI equation. This calculation has not been validated in all  clinical situations. eGFR's persistently <90 mL/min signify possible Chronic Kidney Disease      Patient care acquired from Ivonne Andrew, PA-C disposition pending d-dimer results with plan to discharge patient home if negative w/ PCP f/u. Discussed lab results with patient. Patient endorses improvement in CP. Patient is agreeable to plan.   Patient is to be discharged with recommendation to follow up with PCP in regards to today's hospital visit. Chest pain is not likely of cardiac or pulmonary etiology d/t presentation, negative d-dimer, VSS, no tracheal deviation, no JVD or new murmur, RRR, breath sounds equal bilaterally, EKG without acute abnormalities, negative troponin. Pt has been advised start a PPI and return to the ED is CP becomes exertional, associated with diaphoresis or nausea, radiates to left jaw/arm, worsens or becomes concerning in any way. Pt appears reliable for follow up and is agreeable to discharge.   Case has been discussed with and seen by Dr. Dierdre Highman who agrees with the above plan to discharge.    Lise Auer Sagal Gayton, PA-C  12/17/12 1001 

## 2012-12-17 NOTE — ED Notes (Signed)
Pt. C/o L sided chest pain radiating to back and upper part of L arm. Pt. Has been seen in ED before for central chest pain but sts. "this is different." Pain score 10/10. Denies N/V, dizziness, lightheadedness. C/o SOB. A&Ox4.

## 2012-12-17 NOTE — ED Provider Notes (Signed)
Medical screening examination/treatment/procedure(s) were performed by non-physician practitioner and as supervising physician I was immediately available for consultation/collaboration.    Cristalle Rohm, MD 12/17/12 2317 

## 2012-12-25 ENCOUNTER — Emergency Department (HOSPITAL_COMMUNITY): Payer: Medicaid Other

## 2012-12-25 ENCOUNTER — Encounter (HOSPITAL_COMMUNITY): Payer: Self-pay | Admitting: Emergency Medicine

## 2012-12-25 ENCOUNTER — Emergency Department (HOSPITAL_COMMUNITY)
Admission: EM | Admit: 2012-12-25 | Discharge: 2012-12-26 | Disposition: A | Discharge status: Home / Self Care | Payer: Medicaid Other | Attending: Emergency Medicine | Admitting: Emergency Medicine

## 2012-12-25 DIAGNOSIS — R000 Tachycardia, unspecified: Secondary | ICD-10-CM | POA: Insufficient documentation

## 2012-12-25 DIAGNOSIS — Z8659 Personal history of other mental and behavioral disorders: Secondary | ICD-10-CM | POA: Insufficient documentation

## 2012-12-25 DIAGNOSIS — Z791 Long term (current) use of non-steroidal anti-inflammatories (NSAID): Secondary | ICD-10-CM | POA: Insufficient documentation

## 2012-12-25 DIAGNOSIS — Z3202 Encounter for pregnancy test, result negative: Secondary | ICD-10-CM | POA: Insufficient documentation

## 2012-12-25 DIAGNOSIS — Z8742 Personal history of other diseases of the female genital tract: Secondary | ICD-10-CM | POA: Insufficient documentation

## 2012-12-25 DIAGNOSIS — J45901 Unspecified asthma with (acute) exacerbation: Secondary | ICD-10-CM | POA: Insufficient documentation

## 2012-12-25 DIAGNOSIS — Z862 Personal history of diseases of the blood and blood-forming organs and certain disorders involving the immune mechanism: Secondary | ICD-10-CM | POA: Insufficient documentation

## 2012-12-25 DIAGNOSIS — IMO0002 Reserved for concepts with insufficient information to code with codable children: Secondary | ICD-10-CM | POA: Insufficient documentation

## 2012-12-25 DIAGNOSIS — J189 Pneumonia, unspecified organism: Secondary | ICD-10-CM

## 2012-12-25 DIAGNOSIS — J159 Unspecified bacterial pneumonia: Secondary | ICD-10-CM | POA: Insufficient documentation

## 2012-12-25 DIAGNOSIS — Z8639 Personal history of other endocrine, nutritional and metabolic disease: Secondary | ICD-10-CM | POA: Insufficient documentation

## 2012-12-25 DIAGNOSIS — Z86711 Personal history of pulmonary embolism: Secondary | ICD-10-CM | POA: Insufficient documentation

## 2012-12-25 DIAGNOSIS — Z79899 Other long term (current) drug therapy: Secondary | ICD-10-CM | POA: Insufficient documentation

## 2012-12-25 LAB — BASIC METABOLIC PANEL
BUN: 6 mg/dL (ref 6–23)
Calcium: 9.7 mg/dL (ref 8.4–10.5)
Creatinine, Ser: 0.68 mg/dL (ref 0.50–1.10)
GFR calc Af Amer: >90 mL/min (ref >90)
GFR calc non Af Amer: >90 mL/min (ref >90)
Glucose, Bld: 100 mg/dL — ABNORMAL HIGH (ref 70–99)
Potassium: 3.9 mEq/L (ref 3.5–5.1)

## 2012-12-25 LAB — CBC WITH DIFFERENTIAL/PLATELET
Basophils Relative: 0 % (ref 0–1)
Eosinophils Absolute: 0 10*3/uL (ref 0.0–0.7)
Eosinophils Relative: 0 % (ref 0–5)
Hemoglobin: 10.7 g/dL — ABNORMAL LOW (ref 12.0–15.0)
Lymphs Abs: 1.1 10*3/uL (ref 0.7–4.0)
MCH: 26.3 pg (ref 26.0–34.0)
MCHC: 32.6 g/dL (ref 30.0–36.0)
MCV: 80.6 fL (ref 78.0–100.0)
Monocytes Absolute: 0.8 10*3/uL (ref 0.1–1.0)
Monocytes Relative: 7 % (ref 3–12)
Neutrophils Relative %: 83 % — ABNORMAL HIGH (ref 43–77)
RBC: 4.07 MIL/uL (ref 3.87–5.11)

## 2012-12-25 MED ORDER — ALBUTEROL SULFATE (5 MG/ML) 0.5% IN NEBU
5.0000 mg | INHALATION_SOLUTION | Freq: Once | RESPIRATORY_TRACT | Status: AC
  Administered 2012-12-25: 5 mg via RESPIRATORY_TRACT
  Filled 2012-12-25: qty 1

## 2012-12-25 MED ORDER — IPRATROPIUM BROMIDE 0.02 % IN SOLN
0.5000 mg | Freq: Once | RESPIRATORY_TRACT | Status: AC
  Administered 2012-12-25: 0.5 mg via RESPIRATORY_TRACT
  Filled 2012-12-25: qty 2.5

## 2012-12-25 MED ORDER — PREDNISONE 20 MG PO TABS
60.0000 mg | ORAL_TABLET | Freq: Once | ORAL | Status: AC
  Administered 2012-12-25: 60 mg via ORAL
  Filled 2012-12-25: qty 3

## 2012-12-25 NOTE — ED Notes (Signed)
Pt arrived to Ed with a complaint of chest pain.  Pt states her pain has been causing her to be short of breath.  Pt has been treating herself for a cold but had noticed that here both hands are becoming numb intrmittently  Pt states she has had a pulmonary embolism about a year ago.

## 2012-12-25 NOTE — ED Provider Notes (Signed)
CSN: [id number]     Arrival date & time 12/25/12  2117 History   First MD Initiated Contact with Patient 12/25/12 2144     Chief Complaint  Patient presents with  . Chest Pain   (Consider location/radiation/quality/duration/timing/severity/associated sxs/prior Treatment) Patient is a 27 y.o. female presenting with chest pain. The history is provided by the patient.  Chest Pain  patient here complaining of cough and chest discomfort x48 hours. Has positive sick exposures at home. Fever noted at home treated with Tylenol prior to arrival. Denies any leg swelling. No hemoptysis noted. Denies any pleuritic component to this. She also has a history of asthma and she has been using her inhaler with minimal relief. Denies any diarrhea but has had some posttussive emesis. No rashes. Denies any photophobia or neck pain. Denies any dysuria or hematuria.  Past Medical History  Diagnosis Date  . Asthma   . Preeclampsia   . Hyperthyroidism   . Anxiety   . PE (pulmonary embolism) 11/2011  . Preeclampsia   . Pregnancy induced hypertension   . Preterm labor   . Pulmonary embolism Nov 2013   Past Surgical History  Procedure Laterality Date  . Cesarean section     Family History  Problem Relation Age of Onset  . Hypothyroidism Maternal Aunt   . Hypertension Maternal Grandfather   . Hypertension Paternal Grandmother    History  Substance Use Topics  . Smoking status: Never Smoker   . Smokeless tobacco: Never Used  . Alcohol Use: No     Comment: occasional   OB History   Grav Para Term Preterm Abortions TAB SAB Ect Mult Living   2 1  1      1      Review of Systems  Cardiovascular: Positive for chest pain.  All other systems reviewed and are negative.    Allergies  Cefixime  Home Medications   Current Outpatient Rx  Name  Route  Sig  Dispense  Refill  . albuterol (VENTOLIN HFA) 108 (90 BASE) MCG/ACT inhaler   Inhalation   Inhale 1-2 puffs into the lungs every 6 (six) hours as  needed for wheezing.   1 Inhaler   0     Needs office visit for further refills   . beclomethasone (QVAR) 40 MCG/ACT inhaler   Inhalation   Inhale 2 puffs into the lungs 2 (two) times daily.         . diphenhydrAMINE (BENADRYL) 25 MG tablet   Oral   Take 25 mg by mouth every 6 (six) hours as needed for allergies.         Marland Kitchen ibuprofen (ADVIL,MOTRIN) 800 MG tablet   Oral   Take 1 tablet (800 mg total) by mouth 3 (three) times daily.   21 tablet   0   . omeprazole (PRILOSEC) 20 MG capsule   Oral   Take 1 capsule (20 mg total) by mouth daily.   14 capsule   0   . Phenylephrine-DM-GG-APAP (TYLENOL COLD/FLU SEVERE) 5-10-200-325 MG TABS   Oral   Take 1 tablet by mouth 2 (two) times daily as needed (flu-like symptoms).          BP 128/73  Pulse 128  Temp(Src) 99.6 F (37.6 C) (Oral)  Resp 24  SpO2 100%  LMP 11/21/2012 Physical Exam  Nursing note and vitals reviewed. Constitutional: She is oriented to person, place, and time. She appears well-developed and well-nourished.  Non-toxic appearance. No distress.  HENT:  Head: Normocephalic and  atraumatic.  Eyes: Conjunctivae, EOM and lids are normal. Pupils are equal, round, and reactive to light.  Neck: Normal range of motion. Neck supple. No tracheal deviation present. No mass present.  Cardiovascular: Regular rhythm and normal heart sounds.  Tachycardia present.  Exam reveals no gallop.   No murmur heard. Pulmonary/Chest: Effort normal. No stridor. No respiratory distress. She has decreased breath sounds. She has wheezes. She has no rhonchi. She has no rales.  Abdominal: Soft. Normal appearance and bowel sounds are normal. She exhibits no distension. There is no tenderness. There is no rebound and no CVA tenderness.  Musculoskeletal: Normal range of motion. She exhibits no edema and no tenderness.  Neurological: She is alert and oriented to person, place, and time. She has normal strength. No cranial nerve deficit or  sensory deficit. GCS eye subscore is 4. GCS verbal subscore is 5. GCS motor subscore is 6.  Skin: Skin is warm and dry. No abrasion and no rash noted.  Psychiatric: She has a normal mood and affect. Her speech is normal and behavior is normal.    ED Course  Procedures (including critical care time) Labs Review Labs Reviewed - No data to display Imaging Review No results found.  EKG Interpretation    Date/Time:  Wednesday December 25 2012 21:33:13 EST Ventricular Rate:  120 PR Interval:  106 QRS Duration: 77 QT Interval:  291 QTC Calculation: 411 R Axis:   80 Text Interpretation:  Sinus tachycardia Borderline T abnormalities, diffuse leads , new since last tracing Confirmed by KNAPP  MD-J, JON ([id number]) on 12/25/2012 9:39:39 PM            MDM  No diagnosis found. Patient given albuterol & Solu-Medrol. Repeat exam patient remains with moderate wheezing. D-dimer is elevated. Will check CT to rule out PE. Chest x-ray here is negative for pneumonia. Case signed out to Dr. Francoise Ceo, MD 12/25/12 [phone]

## 2012-12-26 ENCOUNTER — Encounter (HOSPITAL_COMMUNITY): Payer: Self-pay

## 2012-12-26 LAB — INFLUENZA PANEL BY PCR (TYPE A & B)
H1N1 flu by pcr: NOT DETECTED
Influenza A By PCR: NEGATIVE

## 2012-12-26 MED ORDER — ALBUTEROL SULFATE HFA 108 (90 BASE) MCG/ACT IN AERS
2.0000 | INHALATION_SPRAY | Freq: Once | RESPIRATORY_TRACT | Status: AC
  Administered 2012-12-26: 2 via RESPIRATORY_TRACT
  Filled 2012-12-26: qty 6.7

## 2012-12-26 MED ORDER — LEVOFLOXACIN 750 MG PO TABS: 750.0000 mg | ORAL_TABLET | Freq: Every day | ORAL | Status: DC

## 2012-12-26 MED ORDER — PREDNISONE 50 MG PO TABS: 50.0000 mg | ORAL_TABLET | Freq: Every day | ORAL | Status: DC

## 2012-12-26 MED ORDER — LEVOFLOXACIN IN D5W 750 MG/150ML IV SOLN
750.0000 mg | Freq: Once | INTRAVENOUS | Status: AC
Start: 2012-12-26 — End: 2012-12-26
  Administered 2012-12-26: 750 mg via INTRAVENOUS
  Filled 2012-12-26: qty 150

## 2012-12-26 MED ORDER — IOHEXOL 350 MG/ML SOLN
100.0000 mL | Freq: Once | INTRAVENOUS | Status: AC | PRN
  Administered 2012-12-26: 100 mL via INTRAVENOUS

## 2012-12-27 ENCOUNTER — Emergency Department (HOSPITAL_COMMUNITY)
Admission: EM | Admit: 2012-12-27 | Discharge: 2012-12-28 | Discharge status: Against Medical Advice | Payer: Medicaid Other | Attending: Emergency Medicine | Admitting: Emergency Medicine

## 2012-12-27 ENCOUNTER — Encounter (HOSPITAL_COMMUNITY): Payer: Self-pay | Admitting: Emergency Medicine

## 2012-12-27 DIAGNOSIS — Z8701 Personal history of pneumonia (recurrent): Secondary | ICD-10-CM | POA: Insufficient documentation

## 2012-12-27 DIAGNOSIS — G479 Sleep disorder, unspecified: Secondary | ICD-10-CM | POA: Insufficient documentation

## 2012-12-27 DIAGNOSIS — J45901 Unspecified asthma with (acute) exacerbation: Secondary | ICD-10-CM | POA: Insufficient documentation

## 2012-12-27 LAB — BASIC METABOLIC PANEL
BUN: 10 mg/dL (ref 6–23)
Chloride: 102 mEq/L (ref 96–112)
GFR calc non Af Amer: >90 mL/min (ref >90)
Glucose, Bld: 120 mg/dL — ABNORMAL HIGH (ref 70–99)
Potassium: 4.2 mEq/L (ref 3.5–5.1)
Sodium: 137 mEq/L (ref 135–145)

## 2012-12-27 LAB — CBC
HCT: 32.8 % — ABNORMAL LOW (ref 36.0–46.0)
Hemoglobin: 10.7 g/dL — ABNORMAL LOW (ref 12.0–15.0)
MCH: 26.6 pg (ref 26.0–34.0)
RBC: 4.02 MIL/uL (ref 3.87–5.11)

## 2012-12-27 NOTE — ED Notes (Addendum)
Pt reports that she was diagnosed with pneumonia on 12/24 reports she was told if she had increased ShOB to return to the ED, pt reports increased ShOB, being unable to sleep even using her inhaler or sitting in the shower, pt stating "I feel like I'm not able to move any air." Pt a&o x4, speaking in full sentences, NAD noted at this time. Pt reports she had her Rx filled today, but has only taken Prednisone, not her ABX.

## 2012-12-28 NOTE — ED Notes (Signed)
PT called with no answer at this time

## 2013-02-04 ENCOUNTER — Emergency Department (HOSPITAL_COMMUNITY): Payer: BC Managed Care – PPO

## 2013-02-04 ENCOUNTER — Encounter (HOSPITAL_COMMUNITY): Payer: Self-pay | Admitting: Emergency Medicine

## 2013-02-04 ENCOUNTER — Emergency Department (HOSPITAL_COMMUNITY)
Admission: EM | Admit: 2013-02-04 | Discharge: 2013-02-04 | Disposition: A | Discharge status: Home / Self Care | Payer: BC Managed Care – PPO | Attending: Emergency Medicine | Admitting: Emergency Medicine

## 2013-02-04 DIAGNOSIS — R079 Chest pain, unspecified: Secondary | ICD-10-CM

## 2013-02-04 DIAGNOSIS — Z862 Personal history of diseases of the blood and blood-forming organs and certain disorders involving the immune mechanism: Secondary | ICD-10-CM | POA: Insufficient documentation

## 2013-02-04 DIAGNOSIS — Z791 Long term (current) use of non-steroidal anti-inflammatories (NSAID): Secondary | ICD-10-CM | POA: Insufficient documentation

## 2013-02-04 DIAGNOSIS — Z86711 Personal history of pulmonary embolism: Secondary | ICD-10-CM | POA: Insufficient documentation

## 2013-02-04 DIAGNOSIS — R42 Dizziness and giddiness: Secondary | ICD-10-CM | POA: Insufficient documentation

## 2013-02-04 DIAGNOSIS — Z79899 Other long term (current) drug therapy: Secondary | ICD-10-CM | POA: Insufficient documentation

## 2013-02-04 DIAGNOSIS — IMO0002 Reserved for concepts with insufficient information to code with codable children: Secondary | ICD-10-CM | POA: Insufficient documentation

## 2013-02-04 DIAGNOSIS — Z8659 Personal history of other mental and behavioral disorders: Secondary | ICD-10-CM | POA: Insufficient documentation

## 2013-02-04 DIAGNOSIS — J3489 Other specified disorders of nose and nasal sinuses: Secondary | ICD-10-CM | POA: Insufficient documentation

## 2013-02-04 DIAGNOSIS — R0789 Other chest pain: Secondary | ICD-10-CM | POA: Insufficient documentation

## 2013-02-04 DIAGNOSIS — Z8639 Personal history of other endocrine, nutritional and metabolic disease: Secondary | ICD-10-CM | POA: Insufficient documentation

## 2013-02-04 DIAGNOSIS — J45909 Unspecified asthma, uncomplicated: Secondary | ICD-10-CM | POA: Insufficient documentation

## 2013-02-04 LAB — POCT I-STAT, CHEM 8
BUN: 17 mg/dL (ref 6–23)
CHLORIDE: 105 meq/L (ref 96–112)
Calcium, Ion: 1.23 mmol/L (ref 1.12–1.23)
Creatinine, Ser: 0.7 mg/dL (ref 0.50–1.10)
Glucose, Bld: 80 mg/dL (ref 70–99)
HEMATOCRIT: 35 % — AB (ref 36.0–46.0)
Hemoglobin: 11.9 g/dL — ABNORMAL LOW (ref 12.0–15.0)
Potassium: 3.9 mEq/L (ref 3.7–5.3)
Sodium: 142 mEq/L (ref 137–147)
TCO2: 25 mmol/L (ref 0–100)

## 2013-02-04 LAB — D-DIMER, QUANTITATIVE (NOT AT ARMC): D-Dimer, Quant: 0.42 ug/mL-FEU (ref 0.00–0.48)

## 2013-02-04 NOTE — ED Notes (Signed)
Pt states she has chest pain on and off and this morning left side of chest started burning. States she has burning and pain in her left arm. Denies n/v, headache. Pt NAD.

## 2013-02-04 NOTE — ED Notes (Signed)
Patient transported to X-ray 

## 2013-02-04 NOTE — Discharge Instructions (Signed)
Chest Pain (Nonspecific) °It is often hard to give a specific diagnosis for the cause of chest pain. There is always a chance that your pain could be related to something serious, such as a heart attack or a blood clot in the lungs. You need to follow up with your caregiver for further evaluation. °CAUSES  °· Heartburn. °· Pneumonia or bronchitis. °· Anxiety or stress. °· Inflammation around your heart (pericarditis) or lung (pleuritis or pleurisy). °· A blood clot in the lung. °· A collapsed lung (pneumothorax). It can develop suddenly on its own (spontaneous pneumothorax) or from injury (trauma) to the chest. °· Shingles infection (herpes zoster virus). °The chest wall is composed of bones, muscles, and cartilage. Any of these can be the source of the pain. °· The bones can be bruised by injury. °· The muscles or cartilage can be strained by coughing or overwork. °· The cartilage can be affected by inflammation and become sore (costochondritis). °DIAGNOSIS  °Lab tests or other studies, such as X-rays, electrocardiography, stress testing, or cardiac imaging, may be needed to find the cause of your pain.  °TREATMENT  °· Treatment depends on what may be causing your chest pain. Treatment may include: °· Acid blockers for heartburn. °· Anti-inflammatory medicine. °· Pain medicine for inflammatory conditions. °· Antibiotics if an infection is present. °· You may be advised to change lifestyle habits. This includes stopping smoking and avoiding alcohol, caffeine, and chocolate. °· You may be advised to keep your head raised (elevated) when sleeping. This reduces the chance of acid going backward from your stomach into your esophagus. °· Most of the time, nonspecific chest pain will improve within 2 to 3 days with rest and mild pain medicine. °HOME CARE INSTRUCTIONS  °· If antibiotics were prescribed, take your antibiotics as directed. Finish them even if you start to feel better. °· For the next few days, avoid physical  activities that bring on chest pain. Continue physical activities as directed. °· Do not smoke. °· Avoid drinking alcohol. °· Only take over-the-counter or prescription medicine for pain, discomfort, or fever as directed by your caregiver. °· Follow your caregiver's suggestions for further testing if your chest pain does not go away. °· Keep any follow-up appointments you made. If you do not go to an appointment, you could develop lasting (chronic) problems with pain. If there is any problem keeping an appointment, you must call to reschedule. °SEEK MEDICAL CARE IF:  °· You think you are having problems from the medicine you are taking. Read your medicine instructions carefully. °· Your chest pain does not go away, even after treatment. °· You develop a rash with blisters on your chest. °SEEK IMMEDIATE MEDICAL CARE IF:  °· You have increased chest pain or pain that spreads to your arm, neck, jaw, back, or abdomen. °· You develop shortness of breath, an increasing cough, or you are coughing up blood. °· You have severe back or abdominal pain, feel nauseous, or vomit. °· You develop severe weakness, fainting, or chills. °· You have a fever. °THIS IS AN EMERGENCY. Do not wait to see if the pain will go away. Get medical help at once. Call your local emergency services (911 in [place] Do not drive yourself to the hospital. °MAKE SURE YOU:  °· Understand these instructions. °· Will watch your condition. °· Will get help right away if you are not doing well or get worse. °Document Released: 09/28/2004 Document Revised: 03/13/2011 Document Reviewed: 07/25/2007 °[employer]® Patient Information ©2014 [employer]   [employer]. ° °Chest Wall Pain °Chest wall pain is pain in or around the bones and muscles of your chest. It may take up to 6 weeks to get better. It may take longer if you must stay physically active in your work and activities.  °CAUSES  °Chest wall pain may happen on its own. However, it may be caused by: °· A viral illness  like the flu. °· Injury. °· Coughing. °· Exercise. °· Arthritis. °· Fibromyalgia. °· Shingles. °HOME CARE INSTRUCTIONS  °· Avoid overtiring physical activity. Try not to strain or perform activities that cause pain. This includes any activities using your chest or your abdominal and side muscles, especially if heavy weights are used. °· Put ice on the sore area. °· Put ice in a plastic bag. °· Place a towel between your skin and the bag. °· Leave the ice on for 15-20 minutes per hour while awake for the first 2 days. °· Only take over-the-counter or prescription medicines for pain, discomfort, or fever as directed by your caregiver. °SEEK IMMEDIATE MEDICAL CARE IF:  °· Your pain increases, or you are very uncomfortable. °· You have a fever. °· Your chest pain becomes worse. °· You have new, unexplained symptoms. °· You have nausea or vomiting. °· You feel sweaty or lightheaded. °· You have a cough with phlegm (sputum), or you cough up blood. °MAKE SURE YOU:  °· Understand these instructions. °· Will watch your condition. °· Will get help right away if you are not doing well or get worse. °Document Released: 12/19/2004 Document Revised: 03/13/2011 Document Reviewed: 08/15/2010 °[employer]® Patient Information ©2014 [employer]. ° °

## 2013-02-04 NOTE — ED Provider Notes (Addendum)
CSN: [id number]     Arrival date & time 02/04/13  [id number] History   First MD Initiated Contact with Patient 02/04/13 0848     Chief Complaint  Patient presents with  . Chest Pain   (Consider location/radiation/quality/duration/timing/severity/associated sxs/prior Treatment) HPI 28 y.o. Female complaining of chest pain that began at about 0700 and is burning in nature on left anterior chest.  She states she has chest pain frequently.  This pain was somewhat different as it was burning and in the left upper chest with some radiation to the left arm.  She had some sharp pain during he night that worsened with movement.  She feels this was like her usual pain.  She had a pe without source found in fall of 2013 which began after being on oral contraceptives for one week.  She was on xarelto until pregnant then continued anticoagulants until six week post partum- baby is now 57five months old.  She is using condoms for bcp.  First menstrual cycle since pregnancy last week.  She has had some uri symptoms for past week.  No fever, productive cough, or wheezing.  She is not a smoker.   Past Medical History  Diagnosis Date  . Asthma   . Preeclampsia   . Hyperthyroidism   . Anxiety   . PE (pulmonary embolism) 11/2011  . Preeclampsia   . Pregnancy induced hypertension   . Preterm labor   . Pulmonary embolism Nov 2013   Past Surgical History  Procedure Laterality Date  . Cesarean section     Family History  Problem Relation Age of Onset  . Hypothyroidism Maternal Aunt   . Hypertension Maternal Grandfather   . Hypertension Paternal Grandmother    History  Substance Use Topics  . Smoking status: Never Smoker   . Smokeless tobacco: Never Used  . Alcohol Use: No     Comment: occasional   OB History   Grav Para Term Preterm Abortions TAB SAB Ect Mult Living   2 1  1      1      Review of Systems  Constitutional: Negative.   HENT: Positive for congestion, rhinorrhea and sinus pressure. Negative  for ear pain and trouble swallowing.   Respiratory: Positive for chest tightness. Negative for shortness of breath, wheezing and stridor.   Cardiovascular: Positive for chest pain. Negative for palpitations and leg swelling.  Gastrointestinal: Negative.   Musculoskeletal: Negative.   Allergic/Immunologic: Negative.   Neurological: Positive for dizziness.  Hematological: Negative.   Psychiatric/Behavioral: Negative.   All other systems reviewed and are negative.    Allergies  Cefixime  Home Medications   Current Outpatient Rx  Name  Route  Sig  Dispense  Refill  . albuterol (VENTOLIN HFA) 108 (90 BASE) MCG/ACT inhaler   Inhalation   Inhale 1-2 puffs into the lungs every 6 (six) hours as needed for wheezing.   1 Inhaler   0     Needs office visit for further refills   . beclomethasone (QVAR) 40 MCG/ACT inhaler   Inhalation   Inhale 2 puffs into the lungs 2 (two) times daily.         . diphenhydrAMINE (BENADRYL) 25 MG tablet   Oral   Take 25 mg by mouth every 6 (six) hours as needed for allergies.         Marland Kitchen. ibuprofen (ADVIL,MOTRIN) 800 MG tablet   Oral   Take 1 tablet (800 mg total) by mouth 3 (three) times daily.  21 tablet   0   . levofloxacin (LEVAQUIN) 750 MG tablet   Oral   Take 1 tablet (750 mg total) by mouth daily. X 7 days   7 tablet   0   . omeprazole (PRILOSEC) 20 MG capsule   Oral   Take 1 capsule (20 mg total) by mouth daily.   14 capsule   0   . Phenylephrine-DM-GG-APAP (TYLENOL COLD/FLU SEVERE) 5-10-200-325 MG TABS   Oral   Take 1 tablet by mouth 2 (two) times daily as needed (flu-like symptoms).         . predniSONE (DELTASONE) 50 MG tablet   Oral   Take 1 tablet (50 mg total) by mouth daily.   5 tablet   0    BP 111/54  Pulse 63  Temp(Src) 97.8 F (36.6 C) (Oral)  Resp 12  SpO2 100%  LMP 01/26/2013 Physical Exam  Nursing note and vitals reviewed. Constitutional: She is oriented to person, place, and time. She appears  well-developed and well-nourished.  HENT:  Head: Normocephalic and atraumatic.  Right Ear: External ear normal.  Left Ear: External ear normal.  Nose: Nose normal.  Mouth/Throat: Oropharynx is clear and moist.  Eyes: Conjunctivae and EOM are normal. Pupils are equal, round, and reactive to light.  Neck: Normal range of motion. Neck supple.  Cardiovascular: Normal rate, regular rhythm, normal heart sounds and intact distal pulses.   ttp left upper anterior chest wall- reproduces pain.   Pulmonary/Chest: Effort normal and breath sounds normal.  Abdominal: Soft. Bowel sounds are normal.  Musculoskeletal: Normal range of motion.  Neurological: She is alert and oriented to person, place, and time. She has normal reflexes.  Skin: Skin is warm and dry.  Psychiatric: She has a normal mood and affect. Her behavior is normal. Thought content normal.    ED Course  Procedures (including critical care time) Labs Review Labs Reviewed - No data to display Imaging Review No results found.  EKG Interpretation    Date/Time:  Tuesday February 04 2013 08:56:32 EST Ventricular Rate:  65 PR Interval:  134 QRS Duration: 92 QT Interval:  418 QTC Calculation: 435 R Axis:   72 Text Interpretation:  Normal sinus rhythm Confirmed by Jabbar Palmero MD, Delvina Mizzell ([id number]) on 02/04/2013 10:35:42 AM            Patient with left upper anterior chest pain with history of asthma, pneumonia, and pe.  She has a normal ekg, cxr, and d-dimer is normal.  She has pain that is reproducible on exam.  She is given return precautions and voices understanding.   Hilario Quarry, MD 02/04/13 1036  Hilario Quarry, MD 02/04/13 1037

## 2013-02-04 NOTE — Progress Notes (Signed)
[facility] provided pt with a list of primary care resources and [facility] information. Patient stated that she had Medicaid, but did not card with her.

## 2013-03-06 ENCOUNTER — Emergency Department (HOSPITAL_COMMUNITY): Payer: Medicaid Other

## 2013-03-06 ENCOUNTER — Encounter (HOSPITAL_COMMUNITY): Payer: Self-pay | Admitting: Emergency Medicine

## 2013-03-06 ENCOUNTER — Emergency Department (HOSPITAL_COMMUNITY)
Admission: EM | Admit: 2013-03-06 | Discharge: 2013-03-06 | Disposition: A | Discharge status: Home / Self Care | Payer: Medicaid Other | Attending: Emergency Medicine | Admitting: Emergency Medicine

## 2013-03-06 ENCOUNTER — Emergency Department (HOSPITAL_COMMUNITY): Payer: Self-pay

## 2013-03-06 ENCOUNTER — Emergency Department (INDEPENDENT_AMBULATORY_CARE_PROVIDER_SITE_OTHER)
Admission: EM | Admit: 2013-03-06 | Discharge: 2013-03-06 | Disposition: A | Discharge status: Nursing Home (Includes ICF) | Payer: Self-pay | Source: Home / Self Care | Attending: Emergency Medicine | Admitting: Emergency Medicine

## 2013-03-06 DIAGNOSIS — Z86711 Personal history of pulmonary embolism: Secondary | ICD-10-CM

## 2013-03-06 DIAGNOSIS — IMO0002 Reserved for concepts with insufficient information to code with codable children: Secondary | ICD-10-CM | POA: Insufficient documentation

## 2013-03-06 DIAGNOSIS — Z8659 Personal history of other mental and behavioral disorders: Secondary | ICD-10-CM | POA: Diagnosis not present

## 2013-03-06 DIAGNOSIS — Z862 Personal history of diseases of the blood and blood-forming organs and certain disorders involving the immune mechanism: Secondary | ICD-10-CM | POA: Insufficient documentation

## 2013-03-06 DIAGNOSIS — R0989 Other specified symptoms and signs involving the circulatory and respiratory systems: Secondary | ICD-10-CM

## 2013-03-06 DIAGNOSIS — Z7982 Long term (current) use of aspirin: Secondary | ICD-10-CM | POA: Diagnosis not present

## 2013-03-06 DIAGNOSIS — J45901 Unspecified asthma with (acute) exacerbation: Secondary | ICD-10-CM | POA: Insufficient documentation

## 2013-03-06 DIAGNOSIS — Z79899 Other long term (current) drug therapy: Secondary | ICD-10-CM | POA: Diagnosis not present

## 2013-03-06 DIAGNOSIS — Z3202 Encounter for pregnancy test, result negative: Secondary | ICD-10-CM | POA: Diagnosis not present

## 2013-03-06 DIAGNOSIS — R079 Chest pain, unspecified: Secondary | ICD-10-CM

## 2013-03-06 DIAGNOSIS — Z8639 Personal history of other endocrine, nutritional and metabolic disease: Secondary | ICD-10-CM | POA: Insufficient documentation

## 2013-03-06 DIAGNOSIS — R0600 Dyspnea, unspecified: Secondary | ICD-10-CM

## 2013-03-06 DIAGNOSIS — J45909 Unspecified asthma, uncomplicated: Secondary | ICD-10-CM

## 2013-03-06 DIAGNOSIS — R0609 Other forms of dyspnea: Secondary | ICD-10-CM

## 2013-03-06 DIAGNOSIS — R0602 Shortness of breath: Secondary | ICD-10-CM | POA: Diagnosis present

## 2013-03-06 LAB — BASIC METABOLIC PANEL
BUN: 12 mg/dL (ref 6–23)
CO2: 23 mEq/L (ref 19–32)
Calcium: 9.3 mg/dL (ref 8.4–10.5)
Chloride: 101 mEq/L (ref 96–112)
Creatinine, Ser: 0.48 mg/dL — ABNORMAL LOW (ref 0.50–1.10)
Glucose, Bld: 112 mg/dL — ABNORMAL HIGH (ref 70–99)
POTASSIUM: 5 meq/L (ref 3.7–5.3)
Sodium: 137 mEq/L (ref 137–147)

## 2013-03-06 LAB — CBC WITH DIFFERENTIAL/PLATELET
BASOS ABS: 0 10*3/uL (ref 0.0–0.1)
BASOS PCT: 0 % (ref 0–1)
Eosinophils Absolute: 0 10*3/uL (ref 0.0–0.7)
Eosinophils Relative: 0 % (ref 0–5)
HCT: 34.6 % — ABNORMAL LOW (ref 36.0–46.0)
HEMOGLOBIN: 11.2 g/dL — AB (ref 12.0–15.0)
Lymphocytes Relative: 12 % (ref 12–46)
Lymphs Abs: 1.2 10*3/uL (ref 0.7–4.0)
MCH: 26.9 pg (ref 26.0–34.0)
MCHC: 32.4 g/dL (ref 30.0–36.0)
MCV: 83 fL (ref 78.0–100.0)
MONOS PCT: 1 % — AB (ref 3–12)
Monocytes Absolute: 0.1 10*3/uL (ref 0.1–1.0)
NEUTROS ABS: 8.7 10*3/uL — AB (ref 1.7–7.7)
Neutrophils Relative %: 88 % — ABNORMAL HIGH (ref 43–77)
Platelets: 523 10*3/uL — ABNORMAL HIGH (ref 150–400)
RBC: 4.17 MIL/uL (ref 3.87–5.11)
RDW: 16.1 % — AB (ref 11.5–15.5)
WBC: 9.9 10*3/uL (ref 4.0–10.5)

## 2013-03-06 LAB — POC URINE PREG, ED: PREG TEST UR: NEGATIVE

## 2013-03-06 LAB — PROTIME-INR
INR: 1.01 (ref 0.00–1.49)
PROTHROMBIN TIME: 13.1 s (ref 11.6–15.2)

## 2013-03-06 LAB — D-DIMER, QUANTITATIVE (NOT AT ARMC): D-Dimer, Quant: 0.59 ug/mL-FEU — ABNORMAL HIGH (ref 0.00–0.48)

## 2013-03-06 LAB — APTT: APTT: 30 s (ref 24–37)

## 2013-03-06 MED ORDER — PREDNISONE 20 MG PO TABS: 20.0000 mg | ORAL_TABLET | Freq: Two times a day (BID) | ORAL | Status: DC

## 2013-03-06 MED ORDER — IPRATROPIUM-ALBUTEROL 0.5-2.5 (3) MG/3ML IN SOLN
3.0000 mL | RESPIRATORY_TRACT | Status: DC
  Administered 2013-03-06: 3 mL via RESPIRATORY_TRACT

## 2013-03-06 MED ORDER — ALBUTEROL SULFATE HFA 108 (90 BASE) MCG/ACT IN AERS: 2.0000 | INHALATION_SPRAY | RESPIRATORY_TRACT | Status: DC

## 2013-03-06 MED ORDER — ALBUTEROL SULFATE HFA 108 (90 BASE) MCG/ACT IN AERS
2.0000 | INHALATION_SPRAY | Freq: Once | RESPIRATORY_TRACT | Status: AC
  Administered 2013-03-06: 2 via RESPIRATORY_TRACT
  Filled 2013-03-06: qty 6.7

## 2013-03-06 MED ORDER — IOHEXOL 350 MG/ML SOLN
100.0000 mL | Freq: Once | INTRAVENOUS | Status: AC | PRN
  Administered 2013-03-06: 100 mL via INTRAVENOUS

## 2013-03-06 MED ORDER — BECLOMETHASONE DIPROPIONATE 80 MCG/ACT IN AERS: 2.0000 | INHALATION_SPRAY | Freq: Two times a day (BID) | RESPIRATORY_TRACT | Status: DC

## 2013-03-06 MED ORDER — PREDNISONE 20 MG PO TABS
60.0000 mg | ORAL_TABLET | Freq: Once | ORAL | Status: AC
  Administered 2013-03-06: 60 mg via ORAL

## 2013-03-06 MED ORDER — PREDNISONE 20 MG PO TABS
ORAL_TABLET | ORAL | Status: AC
  Filled 2013-03-06: qty 3

## 2013-03-06 MED ORDER — ALBUTEROL SULFATE HFA 108 (90 BASE) MCG/ACT IN AERS: 2.0000 | INHALATION_SPRAY | RESPIRATORY_TRACT | Status: DC | PRN

## 2013-03-06 MED ORDER — IPRATROPIUM-ALBUTEROL 0.5-2.5 (3) MG/3ML IN SOLN
RESPIRATORY_TRACT | Status: AC
  Filled 2013-03-06: qty 3

## 2013-03-06 NOTE — Discharge Instructions (Signed)
Asthma, Adult Asthma is a recurring condition in which the airways tighten and narrow. Asthma can make it difficult to breathe. It can cause coughing, wheezing, and shortness of breath. Asthma episodes (also called asthma attacks) range from minor to life-threatening. Asthma cannot be cured, but medicines and lifestyle changes can help control it. CAUSES Asthma is believed to be caused by inherited (genetic) and environmental factors, but its exact cause is unknown. Asthma may be triggered by allergens, lung infections, or irritants in the air. Asthma triggers are different for each person. Common triggers include:   Animal dander.  Dust mites.  Cockroaches.  Pollen from trees or grass.  Mold.  Smoke.  Air pollutants such as dust, household cleaners, hair sprays, aerosol sprays, paint fumes, strong chemicals, or strong odors.  Cold air, weather changes, and winds (which increase molds and pollens in the air).  Strong emotional expressions such as crying or laughing hard.  Stress.  Certain medicines (such as aspirin) or types of drugs (such as beta-blockers).  Sulfites in foods and drinks. Foods and drinks that may contain sulfites include dried fruit, potato chips, and sparkling grape juice.  Infections or inflammatory conditions such as the flu, a cold, or an inflammation of the nasal membranes (rhinitis).  Gastroesophageal reflux disease (GERD).  Exercise or strenuous activity. SYMPTOMS Symptoms may occur immediately after asthma is triggered or many hours later. Symptoms include:  Wheezing.  Excessive nighttime or early morning coughing.  Frequent or severe coughing with a common cold.  Chest tightness.  Shortness of breath. DIAGNOSIS  The diagnosis of asthma is made by a review of your medical history and a physical exam. Tests may also be performed. These may include:  Lung function studies. These tests show how much air you breath in and out.  Allergy  tests.  Imaging tests such as X-rays. TREATMENT  Asthma cannot be cured, but it can usually be controlled. Treatment involves identifying and avoiding your asthma triggers. It also involves medicines. There are 2 classes of medicine used for asthma treatment:   Controller medicines. These prevent asthma symptoms from occurring. They are usually taken every day.  Reliever or rescue medicines. These quickly relieve asthma symptoms. They are used as needed and provide short-term relief. Your health care provider will help you create an asthma action plan. An asthma action plan is a written plan for managing and treating your asthma attacks. It includes a list of your asthma triggers and how they may be avoided. It also includes information on when medicines should be taken and when their dosage should be changed. An action plan may also involve the use of a device called a peak flow meter. A peak flow meter measures how well the lungs are working. It helps you monitor your condition. HOME CARE INSTRUCTIONS   Take medicine as directed by your health care provider. Speak with your health care provider if you have questions about how or when to take the medicines.  Use a peak flow meter as directed by your health care provider. Record and keep track of readings.  Understand and use the action plan to help minimize or stop an asthma attack without needing to seek medical care.  Control your home environment in the following ways to help prevent asthma attacks:  Do not smoke. Avoid being exposed to secondhand smoke.  Change your heating and air conditioning filter regularly.  Limit your use of fireplaces and wood stoves.  Get rid of pests (such as roaches and   mice) and their droppings.  Throw away plants if you see mold on them.  Clean your floors and dust regularly. Use unscented cleaning products.  Try to have someone else vacuum for you regularly. Stay out of rooms while they are being  vacuumed and for a short while afterward. If you vacuum, use a dust mask from a hardware store, a double-layered or microfilter vacuum cleaner bag, or a vacuum cleaner with a HEPA filter.  Replace carpet with wood, tile, or vinyl flooring. Carpet can trap dander and dust.  Use allergy-proof pillows, mattress covers, and box spring covers.  Wash bed sheets and blankets every week in hot water and dry them in a dryer.  Use blankets that are made of polyester or cotton.  Clean bathrooms and kitchens with bleach. If possible, have someone repaint the walls in these rooms with mold-resistant paint. Keep out of the rooms that are being cleaned and painted.  Wash hands frequently. SEEK MEDICAL CARE IF:   You have wheezing, shortness of breath, or a cough even if taking medicine to prevent attacks.  The colored mucus you cough up (sputum) is thicker than usual.  Your sputum changes from clear or white to yellow, green, gray, or bloody.  You have any problems that may be related to the medicines you are taking (such as a rash, itching, swelling, or trouble breathing).  You are using a reliever medicine more than 2 3 times per week.  Your peak flow is still at 50 79% of you personal best after following your action plan for 1 hour. SEEK IMMEDIATE MEDICAL CARE IF:   You seem to be getting worse and are unresponsive to treatment during an asthma attack.  You are short of breath even at rest.  You get short of breath when doing very little physical activity.  You have difficulty eating, drinking, or talking due to asthma symptoms.  You develop chest pain.  You develop a fast heartbeat.  You have a bluish color to your lips or fingernails.  You are lightheaded, dizzy, or faint.  Your peak flow is less than 50% of your personal best.  You have a fever or persistent symptoms for more than 2 3 days.  You have a fever and symptoms suddenly get worse. MAKE SURE YOU:   Understand these  instructions.  Will watch your condition.  Will get help right away if you are not doing well or get worse. Document Released: 12/19/2004 Document Revised: 08/21/2012 Document Reviewed: 07/18/2012 [employer] Patient Information 2014 [employer].  

## 2013-03-06 NOTE — ED Provider Notes (Signed)
CSN: [id number]     Arrival date & time 03/06/13  1326 History   First MD Initiated Contact with Patient 03/06/13 1547     No chief complaint on file.    (Consider location/radiation/quality/duration/timing/severity/associated sxs/prior Treatment) HPI Comments: 28 yo F hx of asthma, hyperthyroidism, PE, pregnancy induced HTN, presents with CC of SOB, CP.  Pt states symptoms started 2 days ago.  Pt c/o dyspnea at rest, wheezing, CP throughout chest, sharp, intermittent, not pleuritic, not worse with exertion.  Some associated HA, Denies fever, chills, cough, abdominal pain, nausea, vomiting, diarrhea, rash, myalgias, or any other symptoms.  Pt states she has not taken any meds at home for this.  Pt went to [facility] today, and received albuterol neb with significant improvement.  D-dimer performed which was elevated 0.59.  Pt was sent to ED for further work up for possible PE.   The history is provided by the patient. No language interpreter was used.    Past Medical History  Diagnosis Date  . Asthma   . Preeclampsia   . Hyperthyroidism   . Anxiety   . PE (pulmonary embolism) 11/2011  . Preeclampsia   . Pregnancy induced hypertension   . Preterm labor   . Pulmonary embolism Nov 2013   Past Surgical History  Procedure Laterality Date  . Cesarean section     Family History  Problem Relation Age of Onset  . Hypothyroidism Maternal Aunt   . Hypertension Maternal Grandfather   . Hypertension Paternal Grandmother    History  Substance Use Topics  . Smoking status: Never Smoker   . Smokeless tobacco: Never Used  . Alcohol Use: No     Comment: occasional   OB History   Grav Para Term Preterm Abortions TAB SAB Ect Mult Living   2 1  1      1      Review of Systems  Constitutional: Negative for fever and chills.  Respiratory: Positive for shortness of breath and wheezing. Negative for cough and stridor.   Cardiovascular: Positive for chest pain. Negative for palpitations and leg  swelling.  Gastrointestinal: Negative for nausea, vomiting, abdominal pain, diarrhea and constipation.  Musculoskeletal: Negative for myalgias.  Skin: Negative for rash.  Neurological: Negative for dizziness, weakness, light-headedness, numbness and headaches.  Hematological: Negative for adenopathy. Does not bruise/bleed easily.  All other systems reviewed and are negative.      Allergies  Cefixime  Home Medications   Current Outpatient Rx  Name  Route  Sig  Dispense  Refill  . albuterol (PROVENTIL HFA;VENTOLIN HFA) 108 (90 BASE) MCG/ACT inhaler   Inhalation   Inhale 1 puff into the lungs every 6 (six) hours as needed for wheezing or shortness of breath.         Marland Kitchen aspirin 325 MG tablet   Oral   Take 650 mg by mouth daily.         . beclomethasone (QVAR) 40 MCG/ACT inhaler   Inhalation   Inhale 2 puffs into the lungs 2 (two) times daily.         . diphenhydrAMINE (BENADRYL) 25 MG tablet   Oral   Take 25 mg by mouth every 6 (six) hours as needed for allergies.         Marland Kitchen beclomethasone (QVAR) 80 MCG/ACT inhaler   Inhalation   Inhale 2 puffs into the lungs 2 (two) times daily.   1 Inhaler   0   . predniSONE (DELTASONE) 20 MG tablet   Oral  Take 1 tablet (20 mg total) by mouth 2 (two) times daily.   10 tablet   0    BP 129/61  Pulse 68  Temp(Src) 97.5 F (36.4 C) (Oral)  Resp 18  Ht 5\' 3"  (1.6 m)  Wt 200 lb (90.719 kg)  BMI 35.44 kg/m2  SpO2 100%  LMP 01/26/2013 Physical Exam  Nursing note and vitals reviewed. Constitutional: She is oriented to person, place, and time. She appears well-developed and well-nourished.  HENT:  Head: Normocephalic and atraumatic.  Right Ear: External ear normal.  Left Ear: External ear normal.  Nose: Nose normal.  Mouth/Throat: Oropharynx is clear and moist.  Eyes: Conjunctivae and EOM are normal. Pupils are equal, round, and reactive to light.  Neck: Normal range of motion. Neck supple.  Cardiovascular: Normal  rate, regular rhythm, normal heart sounds and intact distal pulses.   Pulmonary/Chest: Breath sounds normal. No respiratory distress. She has no wheezes. She has no rales. She exhibits no tenderness.  Abdominal: Soft. Bowel sounds are normal. She exhibits no distension and no mass. There is no tenderness. There is no rebound and no guarding.  Musculoskeletal: Normal range of motion.  Neurological: She is alert and oriented to person, place, and time.  Skin: Skin is warm and dry.    ED Course  Procedures (including critical care time) Labs Review Labs Reviewed  CBC WITH DIFFERENTIAL - Abnormal; Notable for the following:    Hemoglobin 11.2 (*)    HCT 34.6 (*)    RDW 16.1 (*)    Platelets 523 (*)    Neutrophils Relative % 88 (*)    Neutro Abs 8.7 (*)    Monocytes Relative 1 (*)    All other components within normal limits  BASIC METABOLIC PANEL - Abnormal; Notable for the following:    Glucose, Bld 112 (*)    Creatinine, Ser 0.48 (*)    All other components within normal limits  PROTIME-INR  APTT  POC URINE PREG, ED   Imaging Review Dg Chest 2 View  03/06/2013   CLINICAL DATA:  Shortness of Breath ; asthma  EXAM: CHEST  2 VIEW  COMPARISON:  February 04, 2013  FINDINGS: Lungs are clear. Heart size and pulmonary vascularity are normal. No adenopathy. No bone lesions.  IMPRESSION: No abnormality noted.   Electronically Signed   By: Bretta Bang M.D.   On: 03/06/2013 16:21   Ct Angio Chest Pe W/cm &/or Wo Cm  03/06/2013   CLINICAL DATA:  And and and and and and  EXAM: CT ANGIOGRAPHY CHEST WITH CONTRAST  TECHNIQUE: Multidetector CT imaging of the chest was performed using the standard protocol during bolus administration of intravenous contrast. Multiplanar CT image reconstructions and MIPs were obtained to evaluate the vascular anatomy.  CONTRAST:  OMNIPAQUE IOHEXOL 350 MG/ML SOLN  COMPARISON:  Two-view chest 03/06/2013  FINDINGS: Pulmonary arterial opacification is satisfactory.  There are no focal filling defects to suggest pulmonary emboli.  The heart size is normal. Thymic tissue is within normal limits for age. The thoracic inlet is unremarkable. Limited imaging of the upper abdomen is within normal limits.  The lungs are clear.  The bone windows are within normal limits.  Review of the MIP images confirms the above findings.  IMPRESSION: Negative CTA of the chest.   Electronically Signed   By: Gennette Pac M.D.   On: 03/06/2013 19:15     EKG Interpretation   Date/Time:  Thursday March 06 2013 15:58:42 EST Ventricular Rate:  82 PR Interval:  128 QRS Duration: 86 QT Interval:  381 QTC Calculation: 445 R Axis:   91 Text Interpretation:  Age not entered, assumed to be  28 years old for  purpose of ECG interpretation Sinus rhythm Borderline right axis deviation  Baseline wander in lead(s) III aVF When compared with ECG of 06/20/2006, No  significant change was found Confirmed by [facility] SurgeryGLICK  MD, DAVID ([id number]) on  03/06/2013 4:14:02 PM      MDM   Final diagnoses:  None   28 yo F hx of asthma, hyperthyroidism, PE, pregnancy induced HTN, presents with CC of SOB, CP.  Filed Vitals:   03/06/13 1548  BP: 135/66  Pulse: 72  Temp: 98.2 F (36.8 C)  Resp: 20   Physical exam as above.  VS WNL.  Pt looks in no distress.  Lungs CTAB.  Pt states she feels better after albuterol nebulizer.  EKG WNL.  Likely asthma exacerbation, however [facility]  Will get CT chest to eval for PE.  Will reeval.  CT chest negative for PE.  Likely asthma exacerbation.  Pt given albuterol MDI in ED.  Currently asymptomatic.  Pt to be d/c home in good condition.  Encouraged to continue supportive care.  Rx for MDI.  F/u with PCP in 1 week, and will need continued primary care for her asthma.  Return precautions given.  Pt understands and agrees with plan.  Pt's care plan discussed with Dr. Preston FleetingGlick.   Jon GillsWebb, Shayan Bramhall, MD     Jon GillsZach Crystol Walpole,  MD 03/07/13 [phone]

## 2013-03-06 NOTE — ED Notes (Signed)
Pt presents with sudden onset of shortness and chest pain x 2 days.  Pt reports pain is mid-sternal and radiates through to mid-scapula.  Pt denies any cough, was seen at Urgent Care and referred here for PE (has h/o same).

## 2013-03-06 NOTE — ED Provider Notes (Signed)
28 year old female comes in with two-day history of pleuritic left upper chest pain with associated dyspnea. His no cough or fever. She went to urgent care where an elevated d-dimer was found that she was sent to the ED. In the past, she has had CT angiograms looking for pulmonary bladder which have been negative. On exam, lungs are clear heart has regular rate and rhythm. His no chest wall tenderness. I reviewed her CT angiogram and see no obvious abnormalities did not see any evidence of pneumonia or pulmonary embolism. Official reading is pending but anticipate sending her home with symptomatic treatment.  I saw and evaluated the patient, reviewed the resident's note and I agree with the findings and plan.   EKG Interpretation   Date/Time:  Thursday March 06 2013 15:58:42 EST Ventricular Rate:  82 PR Interval:  128 QRS Duration: 86 QT Interval:  381 QTC Calculation: 445 R Axis:   91 Text Interpretation:  Age not entered, assumed to be  28 years old for  purpose of ECG interpretation Sinus rhythm Borderline right axis deviation  Baseline wander in lead(s) III aVF When compared with ECG of 06/20/2006, No  significant change was found Confirmed by [facility]  MD, Tandi Hanko ([id number]) on  03/06/2013 4:14:02 PM        Dione Boozeavid Gurtej Noyola, MD 03/06/13 [id number]

## 2013-03-06 NOTE — Discharge Instructions (Signed)

## 2013-03-06 NOTE — ED Notes (Signed)
Went to get patient for CXR, was receiving a breathing treatment and has not had the EKG done

## 2013-03-06 NOTE — ED Notes (Signed)
Delay chest xray secondary to completion of ekg

## 2013-03-06 NOTE — ED Notes (Signed)
C/o shortness of breath, chest pain, headache, onset 2 days ago

## 2013-03-06 NOTE — ED Provider Notes (Signed)
Chief Complaint   Chief Complaint  Patient presents with  . Shortness of Breath    History of Present Illness    Debra Horn is a 28 year old female with a prior history of pulmonary embolism who presents with a two-day history of shortness of breath and chest pain. The patient describes chest tightness and wheezing. She has not had any cough. The pain is located over the upper chest bilaterally and is nonpleuritic. The pain is not worse with exertion, activity, position, or meals. It does not radiate. Sometimes it's sharp and sometimes burning. She has also had some headache and nasal congestion. She denies any fever, chills, sore throat, sputum production, or abdominal pain. The patient has a history of asthma since she was a child. She was on Qvar one time. She does not have a primary care physician right now. She's been on albuterol, although her cancer is just about empty and she doesn't know whether she actually getting much medication. She tried the albuterol this morning and it didn't help at all. She's been hospitalized multiple times for the asthma, most recent 5 months ago overnight. She's been seen at the emergency room about 4 or 5 times this year with shortness of breath or chest pain. The patient states she has symptoms of asthma with wheezing and chest tightness both day and night. Sometimes wakes her up at nighttime. She has symptoms at rest and with exertion and sometimes she has to curtail her activities because of the wheezing. She normally uses about one canister of albuterol per month and is using it at least daily. She does not have a primary care physician for followup. She had a pulmonary embolism without source found in fall of 2013 which occurred after being on oral contraceptives for one week. She was on Xarelto until pregnant, then continued anticoagulants until six week post partum. She denies any hemoptysis, leg pain or swelling, or prolonged car or plane trips. She  denies any indigestion or heartburn. The patient has had a history of pica since her pregnancy with cravings for bar soap. She states that she's not been eating much of this recently, and sometimes just smelling the soap seems to satisfy her cravings.  Review of Systems    Other than noted above, the patient denies any of the following symptoms. Systemic:  No fever, chills, sweats, or fatigue. ENT:  No nasal congestion, rhinorrhea, or sore throat. Pulmonary:  No cough, wheezing, shortness of breath, sputum production, hemoptysis. Cardiac:  No palpitations, rapid heartbeat, dizziness, presyncope or syncope. GI:  No abdominal pain, heartburn, nausea, or vomiting. Ext:  No leg pain or swelling.  PMFSH    Past medical history, family history, social history, meds, and allergies were reviewed and updated as needed.   Physical Exam     Vital signs:  BP 137/79  Pulse 70  Temp(Src) 98.7 F (37.1 C) (Oral)  Resp 18  SpO2 100%  LMP 01/26/2013 Gen:  Alert, oriented, in no distress, skin warm and dry. Eye:  PERRL, lids and conjunctivas normal.  Sclera non-icteric. ENT:  Mucous membranes moist, pharynx clear. Neck:  Supple, no adenopathy or tenderness.  No JVD. Lungs:  Clear to auscultation, no wheezes, rales or rhonchi.  No respiratory distress. Heart:  Regular rhythm.  No gallops, murmers, clicks or rubs. Chest:  No chest wall tenderness. Abdomen:  Soft, nontender, no organomegaly or mass.  Bowel sounds normal.  No pulsatile abdominal mass or bruit. Ext:  No edema.  No  calf tenderness and Homann's sign negative.  Pulses full and equal. Skin:  Warm and dry.  No rash.  Labs     Results for orders placed during the hospital encounter of 03/06/13  D-DIMER, QUANTITATIVE      Result Value Ref Range   D-Dimer, Quant 0.59 (*) 0.00 - 0.48 ug/mL-FEU    Electrocardiogram     Date: 03/06/2013  Rate: 62  Rhythm: normal sinus rhythm  QRS Axis: normal  Intervals: normal  ST/T Wave  abnormalities: nonspecific T wave changes she has septal T wave changes. These are new since her previous EKG of a month ago.  Conduction Disutrbances:none  Narrative Interpretation: Normal sinus rhythm with nonspecific T wave changes, T wave inversions in leads V1 through V4 and in lead III. These are new since her last EKG of a month ago.  Old EKG Reviewed: changes noted  Course in Urgent [facility] Center         She was given a DuoNeb breathing treatment and prednisone 60 mg by mouth. She states that thereafter she felt some better.                                                                                                                                                       Assessment     The primary encounter diagnosis was Dyspnea. Diagnoses of Chest pain, Hx pulmonary embolism, and Asthma were also pertinent to this visit.  With a positive d-dimer and prior history of pulmonary embolism, I think she needs to be evaluated further for pulmonary embolism. This may all be related to her asthma, and I have prescribed medications for her asthma. I recommended that she followup at [facility] and [facility] for chronic asthma control.  Plan     1.  Meds:  The following meds were prescribed:   New Prescriptions   ALBUTEROL (PROVENTIL HFA;VENTOLIN HFA) 108 (90 BASE) MCG/ACT INHALER    Inhale 2 puffs into the lungs every 4 (four) hours as needed for wheezing or shortness of breath.   BECLOMETHASONE (QVAR) 80 MCG/ACT INHALER    Inhale 2 puffs into the lungs 2 (two) times daily.   PREDNISONE (DELTASONE) 20 MG TABLET    Take 1 tablet (20 mg total) by mouth 2 (two) times daily.    2.  Patient Education/Counseling:  The patient was given appropriate handouts, self care instructions, and instructed in symptomatic relief.    3.  Follow up:  The patient was told to follow up here if no better in 3 to 4 days, or sooner if becoming worse in any way, and give an an some red flag symptoms such as  worsening pain, shortness of breath, dizziness, or passing out which would prompt immediate return.  At the [facility] and [facility] for asthma control.  The  patient was transferred to the ED via shuttle  in stable condition.  Medical Decision Making   28 year old female with history of prior pulmonary embolism who presents today with a 2 day history of chest pain and shortness of breath.  She also has asthma and has had some wheezing.  Her lungs were clear, O2 sat was 100%, and her EKG showed non-specific T wave changes which are new since her last EKG of one month ago.  Her D-dimer was elevated at 0.59.  She is at risk of of a recurrent PE (is not on any anticoagulent now) and needs further evaluation.      Reuben Likesavid C Hinton Luellen, MD 03/06/13 1318

## 2013-03-13 ENCOUNTER — Encounter (HOSPITAL_COMMUNITY): Payer: Self-pay | Admitting: Emergency Medicine

## 2013-03-13 ENCOUNTER — Emergency Department (HOSPITAL_COMMUNITY)
Admission: EM | Admit: 2013-03-13 | Discharge: 2013-03-13 | Disposition: A | Discharge status: Home / Self Care | Payer: Medicaid Other | Attending: Emergency Medicine | Admitting: Emergency Medicine

## 2013-03-13 DIAGNOSIS — R55 Syncope and collapse: Secondary | ICD-10-CM | POA: Diagnosis not present

## 2013-03-13 DIAGNOSIS — R079 Chest pain, unspecified: Secondary | ICD-10-CM | POA: Diagnosis present

## 2013-03-13 DIAGNOSIS — Z79899 Other long term (current) drug therapy: Secondary | ICD-10-CM | POA: Diagnosis not present

## 2013-03-13 DIAGNOSIS — J45909 Unspecified asthma, uncomplicated: Secondary | ICD-10-CM | POA: Diagnosis not present

## 2013-03-13 DIAGNOSIS — Z8639 Personal history of other endocrine, nutritional and metabolic disease: Secondary | ICD-10-CM | POA: Insufficient documentation

## 2013-03-13 DIAGNOSIS — IMO0002 Reserved for concepts with insufficient information to code with codable children: Secondary | ICD-10-CM | POA: Insufficient documentation

## 2013-03-13 DIAGNOSIS — Z7982 Long term (current) use of aspirin: Secondary | ICD-10-CM | POA: Insufficient documentation

## 2013-03-13 DIAGNOSIS — Z8659 Personal history of other mental and behavioral disorders: Secondary | ICD-10-CM | POA: Diagnosis not present

## 2013-03-13 DIAGNOSIS — R0789 Other chest pain: Secondary | ICD-10-CM | POA: Diagnosis not present

## 2013-03-13 DIAGNOSIS — R42 Dizziness and giddiness: Secondary | ICD-10-CM | POA: Diagnosis not present

## 2013-03-13 DIAGNOSIS — R61 Generalized hyperhidrosis: Secondary | ICD-10-CM | POA: Insufficient documentation

## 2013-03-13 DIAGNOSIS — Z86711 Personal history of pulmonary embolism: Secondary | ICD-10-CM | POA: Insufficient documentation

## 2013-03-13 DIAGNOSIS — Z862 Personal history of diseases of the blood and blood-forming organs and certain disorders involving the immune mechanism: Secondary | ICD-10-CM | POA: Insufficient documentation

## 2013-03-13 LAB — BASIC METABOLIC PANEL
BUN: 12 mg/dL (ref 6–23)
CALCIUM: 9.3 mg/dL (ref 8.4–10.5)
CHLORIDE: 103 meq/L (ref 96–112)
CO2: 24 mEq/L (ref 19–32)
Creatinine, Ser: 0.61 mg/dL (ref 0.50–1.10)
GFR calc non Af Amer: >90 mL/min (ref >90)
Glucose, Bld: 78 mg/dL (ref 70–99)
Potassium: 4.2 mEq/L (ref 3.7–5.3)
SODIUM: 140 meq/L (ref 137–147)

## 2013-03-13 LAB — CBC
HCT: 34.1 % — ABNORMAL LOW (ref 36.0–46.0)
Hemoglobin: 10.9 g/dL — ABNORMAL LOW (ref 12.0–15.0)
MCH: 26.7 pg (ref 26.0–34.0)
MCHC: 32 g/dL (ref 30.0–36.0)
MCV: 83.6 fL (ref 78.0–100.0)
PLATELETS: 539 10*3/uL — AB (ref 150–400)
RBC: 4.08 MIL/uL (ref 3.87–5.11)
RDW: 16.2 % — AB (ref 11.5–15.5)
WBC: 7.1 10*3/uL (ref 4.0–10.5)

## 2013-03-13 LAB — I-STAT TROPONIN, ED: TROPONIN I, POC: 0 ng/mL (ref 0.00–0.08)

## 2013-03-13 MED ORDER — IBUPROFEN 600 MG PO TABS: 600.0000 mg | ORAL_TABLET | Freq: Four times a day (QID) | ORAL | Status: DC | PRN

## 2013-03-13 MED ORDER — DEXAMETHASONE SODIUM PHOSPHATE 10 MG/ML IJ SOLN
10.0000 mg | Freq: Once | INTRAMUSCULAR | Status: AC
  Administered 2013-03-13: 10 mg via INTRAVENOUS
  Filled 2013-03-13: qty 1

## 2013-03-13 MED ORDER — KETOROLAC TROMETHAMINE 15 MG/ML IJ SOLN
30.0000 mg | Freq: Once | INTRAMUSCULAR | Status: AC
  Administered 2013-03-13: 30 mg via INTRAVENOUS
  Filled 2013-03-13: qty 2

## 2013-03-13 MED ORDER — ASPIRIN 81 MG PO CHEW
162.0000 mg | CHEWABLE_TABLET | Freq: Once | ORAL | Status: AC
  Administered 2013-03-13: 162 mg via ORAL
  Filled 2013-03-13: qty 2

## 2013-03-13 NOTE — ED Provider Notes (Signed)
CSN: [id number]     Arrival date & time 03/13/13  0011 History   First MD Initiated Contact with Patient 03/13/13 0058     Chief Complaint  Patient presents with  . Chest Pain     (Consider location/radiation/quality/duration/timing/severity/associated sxs/prior Treatment) HPI Comments: Pt comes in with cc of chest pain. Pt has hx of PE, off of her anticoagulation. Pt was seen just a week ago with chest pain, and had negative CT PE. Pt reports that she has had dull, bilateral chest pain x 2-3 daus with no response to tylenol. Pt also had a syncopal episode this afternoon. There is no family hx of premature CAD, and no personal hx of syncope, CHF. Pt denies substance abuse.  Patient is a 28 y.o. female presenting with chest pain. The history is provided by the patient.  Chest Pain Associated symptoms: diaphoresis   Associated symptoms: no abdominal pain, no headache, no nausea, no shortness of breath and not vomiting     Past Medical History  Diagnosis Date  . Asthma   . Preeclampsia   . Hyperthyroidism   . Anxiety   . PE (pulmonary embolism) 11/2011  . Preeclampsia   . Pregnancy induced hypertension   . Preterm labor   . Pulmonary embolism Nov 2013   Past Surgical History  Procedure Laterality Date  . Cesarean section     Family History  Problem Relation Age of Onset  . Hypothyroidism Maternal Aunt   . Hypertension Maternal Grandfather   . Hypertension Paternal Grandmother    History  Substance Use Topics  . Smoking status: Never Smoker   . Smokeless tobacco: Never Used  . Alcohol Use: No     Comment: occasional   OB History   Grav Para Term Preterm Abortions TAB SAB Ect Mult Living   2 1  1      1      Review of Systems  Constitutional: Positive for diaphoresis. Negative for activity change.  Respiratory: Negative for shortness of breath.   Cardiovascular: Positive for chest pain.  Gastrointestinal: Negative for nausea, vomiting and abdominal pain.   Genitourinary: Negative for dysuria.  Musculoskeletal: Negative for neck pain.  Neurological: Positive for syncope and light-headedness. Negative for headaches.      Allergies  Cefixime  Home Medications   Current Outpatient Rx  Name  Route  Sig  Dispense  Refill  . albuterol (PROVENTIL HFA;VENTOLIN HFA) 108 (90 BASE) MCG/ACT inhaler   Inhalation   Inhale 1 puff into the lungs every 6 (six) hours as needed for wheezing or shortness of breath.         Marland Kitchen aspirin 325 MG tablet   Oral   Take 325 mg by mouth daily.          . diphenhydrAMINE (BENADRYL) 25 mg capsule   Oral   Take 25 mg by mouth every 8 (eight) hours as needed for allergies.         . predniSONE (DELTASONE) 20 MG tablet   Oral   Take 1 tablet (20 mg total) by mouth 2 (two) times daily.   10 tablet   0   . ibuprofen (ADVIL,MOTRIN) 600 MG tablet   Oral   Take 1 tablet (600 mg total) by mouth every 6 (six) hours as needed.   30 tablet   0    BP 113/67  Pulse 80  Temp(Src) 98.1 F (36.7 C) (Oral)  Resp 18  Ht 5\' 3"  (1.6 m)  Wt 220 lb (  99.791 kg)  BMI 38.98 kg/m2  SpO2 100%  LMP 03/03/2013 Physical Exam  Nursing note and vitals reviewed. Constitutional: She is oriented to person, place, and time. She appears well-developed and well-nourished.  HENT:  Head: Normocephalic and atraumatic.  Eyes: EOM are normal. Pupils are equal, round, and reactive to light.  Neck: Neck supple. No JVD present.  Cardiovascular: Normal rate, regular rhythm and normal heart sounds.   No murmur heard. Pulmonary/Chest: Effort normal. No respiratory distress.  Abdominal: Soft. She exhibits no distension. There is no tenderness. There is no rebound and no guarding.  Musculoskeletal: She exhibits no edema.  Neurological: She is alert and oriented to person, place, and time.  Skin: Skin is warm and dry.    ED Course  Procedures (including critical care time) Labs Review Labs Reviewed  CBC - Abnormal; Notable for  the following:    Hemoglobin 10.9 (*)    HCT 34.1 (*)    RDW 16.2 (*)    Platelets 539 (*)    All other components within normal limits  BASIC METABOLIC PANEL  I-STAT TROPOININ, ED   Imaging Review No results found.   EKG Interpretation   Date/Time:  Thursday March 13 2013 00:22:12 EDT Ventricular Rate:  80 PR Interval:  114 QRS Duration: 77 QT Interval:  358 QTC Calculation: 413 R Axis:   74 Text Interpretation:  Sinus rhythm Borderline short PR interval Confirmed  by Rhunette CroftNANAVATI, MD, Domanic Matusek ([id number]) on 03/13/2013 3:42:34 AM      MDM   Final diagnoses:  Chest pain    Pt comes in with cc of chest pain, syncope, diaphoresis, fatigue.  EKG has no acute abnormalities, she had a recent CT PE that was neg.  Pt has SF syncope score of 0, and her family hx is benign for any dysrhythmias. Cant explain the cause of her pain. Will give motrin, decadron shot here and get her to see her pcp.    Derwood KaplanAnkit Ammie Warrick, MD 03/13/13 0345

## 2013-03-13 NOTE — Discharge Instructions (Signed)
We saw you in the ER for the chest pain/shortness of breath. °All of our cardiac workup is normal, including labs, EKG and chest X-RAY are normal. °We are not sure what is causing your discomfort, but we feel comfortable sending you home at this time. The workup in the ER is not complete, and you should follow up with your primary care doctor for further evaluation. ° ° °Chest Pain (Nonspecific) °It is often hard to give a specific diagnosis for the cause of chest pain. There is always a chance that your pain could be related to something serious, such as a heart attack or a blood clot in the lungs. You need to follow up with your caregiver for further evaluation. °CAUSES  °· Heartburn. °· Pneumonia or bronchitis. °· Anxiety or stress. °· Inflammation around your heart (pericarditis) or lung (pleuritis or pleurisy). °· A blood clot in the lung. °· A collapsed lung (pneumothorax). It can develop suddenly on its own (spontaneous pneumothorax) or from injury (trauma) to the chest. °· Shingles infection (herpes zoster virus). °The chest wall is composed of bones, muscles, and cartilage. Any of these can be the source of the pain. °· The bones can be bruised by injury. °· The muscles or cartilage can be strained by coughing or overwork. °· The cartilage can be affected by inflammation and become sore (costochondritis). °DIAGNOSIS  °Lab tests or other studies, such as X-rays, electrocardiography, stress testing, or cardiac imaging, may be needed to find the cause of your pain.  °TREATMENT  °· Treatment depends on what may be causing your chest pain. Treatment may include: °· Acid blockers for heartburn. °· Anti-inflammatory medicine. °· Pain medicine for inflammatory conditions. °· Antibiotics if an infection is present. °· You may be advised to change lifestyle habits. This includes stopping smoking and avoiding alcohol, caffeine, and chocolate. °· You may be advised to keep your head raised (elevated) when sleeping.  This reduces the chance of acid going backward from your stomach into your esophagus. °· Most of the time, nonspecific chest pain will improve within 2 to 3 days with rest and mild pain medicine. °HOME CARE INSTRUCTIONS  °· If antibiotics were prescribed, take your antibiotics as directed. Finish them even if you start to feel better. °· For the next few days, avoid physical activities that bring on chest pain. Continue physical activities as directed. °· Do not smoke. °· Avoid drinking alcohol. °· Only take over-the-counter or prescription medicine for pain, discomfort, or fever as directed by your caregiver. °· Follow your caregiver's suggestions for further testing if your chest pain does not go away. °· Keep any follow-up appointments you made. If you do not go to an appointment, you could develop lasting (chronic) problems with pain. If there is any problem keeping an appointment, you must call to reschedule. °SEEK MEDICAL CARE IF:  °· You think you are having problems from the medicine you are taking. Read your medicine instructions carefully. °· Your chest pain does not go away, even after treatment. °· You develop a rash with blisters on your chest. °SEEK IMMEDIATE MEDICAL CARE IF:  °· You have increased chest pain or pain that spreads to your arm, neck, jaw, back, or abdomen. °· You develop shortness of breath, an increasing cough, or you are coughing up blood. °· You have severe back or abdominal pain, feel nauseous, or vomit. °· You develop severe weakness, fainting, or chills. °· You have a fever. °THIS IS AN EMERGENCY. Do not wait to   see if the pain will go away. Get medical help at once. Call your local emergency services (911 in [place] Do not drive yourself to the hospital. °MAKE SURE YOU:  °· Understand these instructions. °· Will watch your condition. °· Will get help right away if you are not doing well or get worse. °Document Released: 09/28/2004 Document Revised: 03/13/2011 Document Reviewed:  07/25/2007 °[employer]® Patient Information ©2014 [employer]. ° °

## 2013-03-13 NOTE — ED Notes (Signed)
Pt reports generalized chest pain described as dull discomfort x2-3 days - pt admits to hx of "frequent episodes of chest pain" that pt has been seen for and dx w/ musculoskeletal pain. Pt states this pain is increased and she also experienced an episode of syncope this afternoon approx 1400 - pt c/o dizziness, lightheadedness and diaphoresis.

## 2013-04-10 ENCOUNTER — Encounter (HOSPITAL_COMMUNITY): Payer: Self-pay | Admitting: Emergency Medicine

## 2013-04-10 ENCOUNTER — Emergency Department (HOSPITAL_COMMUNITY)
Admission: EM | Admit: 2013-04-10 | Discharge: 2013-04-11 | Disposition: A | Discharge status: Home / Self Care | Payer: 59 | Attending: Emergency Medicine | Admitting: Emergency Medicine

## 2013-04-10 DIAGNOSIS — Z79899 Other long term (current) drug therapy: Secondary | ICD-10-CM | POA: Insufficient documentation

## 2013-04-10 DIAGNOSIS — Z86711 Personal history of pulmonary embolism: Secondary | ICD-10-CM | POA: Diagnosis not present

## 2013-04-10 DIAGNOSIS — J45901 Unspecified asthma with (acute) exacerbation: Secondary | ICD-10-CM | POA: Diagnosis not present

## 2013-04-10 DIAGNOSIS — J45909 Unspecified asthma, uncomplicated: Secondary | ICD-10-CM

## 2013-04-10 DIAGNOSIS — Z862 Personal history of diseases of the blood and blood-forming organs and certain disorders involving the immune mechanism: Secondary | ICD-10-CM | POA: Insufficient documentation

## 2013-04-10 DIAGNOSIS — Z8659 Personal history of other mental and behavioral disorders: Secondary | ICD-10-CM | POA: Insufficient documentation

## 2013-04-10 DIAGNOSIS — R079 Chest pain, unspecified: Secondary | ICD-10-CM

## 2013-04-10 DIAGNOSIS — Z3202 Encounter for pregnancy test, result negative: Secondary | ICD-10-CM | POA: Insufficient documentation

## 2013-04-10 DIAGNOSIS — Z7982 Long term (current) use of aspirin: Secondary | ICD-10-CM | POA: Insufficient documentation

## 2013-04-10 DIAGNOSIS — R0602 Shortness of breath: Secondary | ICD-10-CM | POA: Diagnosis present

## 2013-04-10 DIAGNOSIS — Z8639 Personal history of other endocrine, nutritional and metabolic disease: Secondary | ICD-10-CM | POA: Insufficient documentation

## 2013-04-10 NOTE — ED Notes (Signed)
Pt states she has shortness of breath for the past two days  Pt states she is out of her inhaler  Pt states hx of asthma and PE  Pt states she  has pain across her sternal area and in her back describes it as a tightness

## 2013-04-11 DIAGNOSIS — J45901 Unspecified asthma with (acute) exacerbation: Secondary | ICD-10-CM | POA: Diagnosis not present

## 2013-04-11 LAB — CBC WITH DIFFERENTIAL/PLATELET
BASOS ABS: 0 10*3/uL (ref 0.0–0.1)
BASOS PCT: 0 % (ref 0–1)
EOS ABS: 0.1 10*3/uL (ref 0.0–0.7)
EOS PCT: 2 % (ref 0–5)
HCT: 35.1 % — ABNORMAL LOW (ref 36.0–46.0)
Hemoglobin: 10.9 g/dL — ABNORMAL LOW (ref 12.0–15.0)
Lymphocytes Relative: 45 % (ref 12–46)
Lymphs Abs: 3.8 10*3/uL (ref 0.7–4.0)
MCH: 26 pg (ref 26.0–34.0)
MCHC: 31.1 g/dL (ref 30.0–36.0)
MCV: 83.8 fL (ref 78.0–100.0)
Monocytes Absolute: 0.7 10*3/uL (ref 0.1–1.0)
Monocytes Relative: 9 % (ref 3–12)
Neutro Abs: 3.7 10*3/uL (ref 1.7–7.7)
Neutrophils Relative %: 44 % (ref 43–77)
PLATELETS: 449 10*3/uL — AB (ref 150–400)
RBC: 4.19 MIL/uL (ref 3.87–5.11)
RDW: 15.8 % — AB (ref 11.5–15.5)
WBC: 8.4 10*3/uL (ref 4.0–10.5)

## 2013-04-11 LAB — COMPREHENSIVE METABOLIC PANEL
ALT: 17 U/L (ref 0–35)
AST: 16 U/L (ref 0–37)
Albumin: 3.6 g/dL (ref 3.5–5.2)
Alkaline Phosphatase: 116 U/L (ref 39–117)
BUN: 15 mg/dL (ref 6–23)
CALCIUM: 10 mg/dL (ref 8.4–10.5)
CO2: 27 mEq/L (ref 19–32)
Chloride: 102 mEq/L (ref 96–112)
Creatinine, Ser: 0.59 mg/dL (ref 0.50–1.10)
GFR calc non Af Amer: >90 mL/min (ref >90)
Glucose, Bld: 75 mg/dL (ref 70–99)
Potassium: 4 mEq/L (ref 3.7–5.3)
SODIUM: 140 meq/L (ref 137–147)
TOTAL PROTEIN: 8 g/dL (ref 6.0–8.3)
Total Bilirubin: <0.2 mg/dL — ABNORMAL LOW (ref 0.3–1.2)

## 2013-04-11 LAB — PREGNANCY, URINE: Preg Test, Ur: NEGATIVE

## 2013-04-11 LAB — TROPONIN I: Troponin I: <0.3 ng/mL (ref <0.30)

## 2013-04-11 MED ORDER — ALBUTEROL SULFATE HFA 108 (90 BASE) MCG/ACT IN AERS
2.0000 | INHALATION_SPRAY | Freq: Once | RESPIRATORY_TRACT | Status: AC
  Administered 2013-04-11: 2 via RESPIRATORY_TRACT
  Filled 2013-04-11: qty 6.7

## 2013-04-11 MED ORDER — ALBUTEROL SULFATE HFA 108 (90 BASE) MCG/ACT IN AERS: 2.0000 | INHALATION_SPRAY | Freq: Once | RESPIRATORY_TRACT | Status: DC

## 2013-04-11 NOTE — ED Notes (Signed)
Multiple attempts were made to gain IV access on pt by 3 RN's. Ultrasound machine used locate vein. PA made aware.

## 2013-04-11 NOTE — ED Provider Notes (Signed)
CSN: [id number]     Arrival date & time 04/10/13  2246 History   First MD Initiated Contact with Patient 04/10/13 2346     Chief Complaint  Patient presents with  . Shortness of Breath     (Consider location/radiation/quality/duration/timing/severity/associated sxs/prior Treatment) The history is provided by the patient. No language interpreter was used.  [name] is a 28 year old female with past medical history of asthma, preeclampsia, hypothyroidism, PE presenting to the ED with chest tightness and shortness of breath. Patient reported that shortness of breath has been ongoing for the past 2 days. Stated that she's been experiencing chest pain localize the Center for chest described as a aching sensation with intermittent sharp pain that lasts a few minutes with radiation towards her back. Stated that she's been taking aspirin as needed. Reported that she's been using her albuterol inhaler-reported that she ran out of her albuterol inhaler. Reported that she was diagnosed with pulmonary embolism over a year ago right before she was pregnant. Denied fever, diaphoresis, chills, blurred vision, abdominal pain, nausea, vomiting, diarrhea, cough, nasal congestion, eye complaints, hemoptysis, syncope, weakness, numbness, tingling, birth control, recent surgery, recent traveling long distance. PCP Dr. Benancio Deeds  Past Medical History  Diagnosis Date  . Asthma   . Preeclampsia   . Hyperthyroidism   . Anxiety   . PE (pulmonary embolism) 11/2011  . Preeclampsia   . Pregnancy induced hypertension   . Preterm labor   . Pulmonary embolism Nov 2013   Past Surgical History  Procedure Laterality Date  . Cesarean section     Family History  Problem Relation Age of Onset  . Hypothyroidism Maternal Aunt   . Hypertension Maternal Grandfather   . Hypertension Paternal Grandmother    History  Substance Use Topics  . Smoking status: Never Smoker   . Smokeless tobacco: Never Used  . Alcohol  Use: No     Comment: occasional   OB History   Grav Para Term Preterm Abortions TAB SAB Ect Mult Living   2 1  1      1      Review of Systems  Constitutional: Negative for fever and chills.  Respiratory: Positive for chest tightness and shortness of breath.   Cardiovascular: Negative for chest pain.  Gastrointestinal: Negative for nausea, vomiting, abdominal pain and diarrhea.  Neurological: Negative for dizziness and weakness.  All other systems reviewed and are negative.     Allergies  Banana and Cefixime  Home Medications   Current Outpatient Rx  Name  Route  Sig  Dispense  Refill  . albuterol (PROVENTIL HFA;VENTOLIN HFA) 108 (90 BASE) MCG/ACT inhaler   Inhalation   Inhale 1 puff into the lungs every 6 (six) hours as needed for wheezing or shortness of breath.         Marland Kitchen aspirin 325 MG tablet   Oral   Take 325 mg by mouth daily.          . diphenhydrAMINE (BENADRYL) 25 mg capsule   Oral   Take 25 mg by mouth every 8 (eight) hours as needed for allergies.          BP 132/76  Pulse 80  Temp(Src) 97.9 F (36.6 C) (Oral)  Resp 14  Ht 5\' 3"  (1.6 m)  Wt 200 lb (90.719 kg)  BMI 35.44 kg/m2  SpO2 99%  LMP 03/03/2013 Physical Exam  Nursing note and vitals reviewed. Constitutional: She is oriented to person, place, and time. She appears well-developed and well-nourished.  No distress.  Patient appears well, laying in bed without any discomfort-negative signs of distress  HENT:  Head: Normocephalic and atraumatic.  Mouth/Throat: Oropharynx is clear and moist. No oropharyngeal exudate.  Eyes: Conjunctivae and EOM are normal. Pupils are equal, round, and reactive to light. Right eye exhibits no discharge. Left eye exhibits no discharge.  Neck: Normal range of motion. Neck supple. No tracheal deviation present.  Cardiovascular: Normal rate, regular rhythm and normal heart sounds.  Exam reveals no friction rub.   No murmur heard. Cap refill less than 3  seconds Negative swelling or pitting edema identified to lower extremities bilaterally Negative Homans sign bilaterally  Pulmonary/Chest: Effort normal and breath sounds normal. No respiratory distress. She has no wheezes. She has no rales.  Patient stable to speak in full sentences without difficulty Negative use of accessory muscles Negative stridor  Musculoskeletal: Normal range of motion.  Full ROM to upper and lower extremities without difficulty noted, negative ataxia noted.  Lymphadenopathy:    She has no cervical adenopathy.  Neurological: She is alert and oriented to person, place, and time. No cranial nerve deficit. She exhibits normal muscle tone. Coordination normal.  Skin: Skin is warm and dry. No rash noted. She is not diaphoretic. No erythema.  Psychiatric: She has a normal mood and affect. Her behavior is normal. Thought content normal.    ED Course  Procedures (including critical care time)  2:18 AM Attending physician in room assessing patient.   Results for orders placed during the hospital encounter of 04/10/13  TROPONIN I      Result Value Ref Range   Troponin I <0.30  <0.30 ng/mL  CBC WITH DIFFERENTIAL      Result Value Ref Range   WBC 8.4  4.0 - 10.5 K/uL   RBC 4.19  3.87 - 5.11 MIL/uL   Hemoglobin 10.9 (*) 12.0 - 15.0 g/dL   HCT 16.1 (*) 09.6 - 04.5 %   MCV 83.8  78.0 - 100.0 fL   MCH 26.0  26.0 - 34.0 pg   MCHC 31.1  30.0 - 36.0 g/dL   RDW 40.9 (*) 81.1 - 91.4 %   Platelets 449 (*) 150 - 400 K/uL   Neutrophils Relative % 44  43 - 77 %   Neutro Abs 3.7  1.7 - 7.7 K/uL   Lymphocytes Relative 45  12 - 46 %   Lymphs Abs 3.8  0.7 - 4.0 K/uL   Monocytes Relative 9  3 - 12 %   Monocytes Absolute 0.7  0.1 - 1.0 K/uL   Eosinophils Relative 2  0 - 5 %   Eosinophils Absolute 0.1  0.0 - 0.7 K/uL   Basophils Relative 0  0 - 1 %   Basophils Absolute 0.0  0.0 - 0.1 K/uL  COMPREHENSIVE METABOLIC PANEL      Result Value Ref Range   Sodium 140  137 - 147 mEq/L    Potassium 4.0  3.7 - 5.3 mEq/L   Chloride 102  96 - 112 mEq/L   CO2 27  19 - 32 mEq/L   Glucose, Bld 75  70 - 99 mg/dL   BUN 15  6 - 23 mg/dL   Creatinine, Ser 7.82  0.50 - 1.10 mg/dL   Calcium 95.6  8.4 - 21.3 mg/dL   Total Protein 8.0  6.0 - 8.3 g/dL   Albumin 3.6  3.5 - 5.2 g/dL   AST 16  0 - 37 U/L   ALT 17  0 - 35 U/L   Alkaline Phosphatase 116  39 - 117 U/L   Total Bilirubin <0.2 (*) 0.3 - 1.2 mg/dL   GFR calc non Af Amer >90  >90 mL/min   GFR calc Af Amer >90  >90 mL/min  PREGNANCY, URINE      Result Value Ref Range   Preg Test, Ur NEGATIVE  NEGATIVE    Labs Review Labs Reviewed  CBC WITH DIFFERENTIAL - Abnormal; Notable for the following:    Hemoglobin 10.9 (*)    HCT 35.1 (*)    RDW 15.8 (*)    Platelets 449 (*)    All other components within normal limits  COMPREHENSIVE METABOLIC PANEL - Abnormal; Notable for the following:    Total Bilirubin <0.2 (*)    All other components within normal limits  TROPONIN I  PREGNANCY, URINE   Imaging Review No results found.   EKG Interpretation   Date/Time:  Thursday April 10 2013 23:06:09 EDT Ventricular Rate:  83 PR Interval:  122 QRS Duration: 73 QT Interval:  349 QTC Calculation: 410 R Axis:   76 Text Interpretation:  Sinus rhythm Borderline T abnormalities, anterior  leads Confirmed by Rhunette CroftNANAVATI, MD, ANKIT ([id number]) on 04/11/2013 2:13:35 AM      MDM   Final diagnoses:  Chest pain  Asthma   Filed Vitals:   04/10/13 2254 04/11/13 0007  BP: 124/78 132/76  Pulse: 97 80  Temp: 98.6 F (37 C) 97.9 F (36.6 C)  TempSrc: Oral Oral  Resp: 16 14  Height: 5\' 3"  (1.6 m)   Weight: 200 lb (90.719 kg)   SpO2: 99% 99%    Patient presenting to the ED with shortness of breath and chest tightness is been ongoing for the past 2 days. Stated that the chest discomfort is localized to the Center the chest described as a aching sensation with intermittent sharp pain with radiation towards her back. Stated that she's  been using aspirin and albuterol with minimal leakage reported that she ran out of her albuterol. Patient has history of PE over approximately a year ago right before patient is pregnant. This provider reviewed patient's chart. Patient is been seen and assessed in ED setting numerous times regarding chest pain. Most recent assessment was 03/13/2013 regarding chest pain where EKG showed negative acute abnormalities, i-STAT troponin negative elevation. Patient was discharged home. Patient recently had CT angiogram of the chest performed on 03/06/2013 that is negative for PE. D-dimer from February 2015 was 0.42-negative elevation. Alert and oriented. GCS 15. Heart rate and rhythm normal. Lungs clear to auscultation to upper and lower lobes bilaterally-negative use of accessory muscles, patient is able to speak in sentences without difficulty. Negative stridor. Cap refill less than 3 seconds. Radial and DP pulses 2+ bilaterally. Negative swelling or pitting edema localized to the lower extremity bilaterally. Full range of motion to upper and lower extremities bilaterally without difficulty or ataxia. EKG noted normal sinus rhythm with a heart rate of 83 beats per minute. Troponin negative elevation. CBC negative elevation of white blood cell count noted-negative left shift or leukocytosis. Patient has history of anemia-hemoglobin has been continuously low when compared to previous results. CMP negative findings. Urine pregnancy negative. Patient seen and assessed by attending physician, Dr. Rhunette CroftNanavati. Attending physician does not recommend imaging at this time since lungs clear to auscultation. Reported that d-dimer does not need to be performed since CT angiogram of chest performed in 03/2013 was negative. As per attending physician recommended patient be  discharged with albuterol and for followup with PCP. HEART score 1. Doubt PE-CT angiogram of the chest performed in March 2015 was negative for PE, patient not  tachycardic, patient not tachypneic, pulse ox adequate on room air. Doubt cardiac issue. Doubt dissection. Doubt pneumonia. Doubt pneumothorax. Negative signs of respiratory distress. Pulse ox 99% on room air. Negative use of excess her muscles. Patient is able to speak in full sentences without difficulty. Suspicion to be possible asthma. Patient stable, afebrile. Discharged patient. Discharge patient with albuterol inhaler. Discussed with patient to rest and stay hydrated. [facility] [place]. Discussed with patient to closely monitor symptoms and if symptoms are to worsen or change to report back to the ED - strict return instructions given.  Patient agreed to plan of care, understood, all questions answered.   Raymon Mutton, PA-C 04/11/13 [id number]

## 2013-04-11 NOTE — Discharge Instructions (Signed)
Please call your doctor for a followup appointment within 24-48 hours. When you talk to your doctor please let them know that you were seen in the emergency department and have them acquire all of your records so that they can discuss the findings with you and formulate a treatment plan to fully care for your new and ongoing problems. Please call and set-up an appointment with [facility] and/or [facility] to be assessed - important to be followed by a primary care provider Please rest and stay hydrated Please avoid any physical or strenuous activity Please use albuterol as needed for shortness of breath Please continue to monitor symptoms closely and if symptoms are to worsen or change (fever greater than 101, chills, sweating, nausea, chest pain, shortness of breath, difficulty breathing, inability to swallow, throat closing sensation, weakness, numbness, fainting) please report back to the ED immediately   Asthma, Adult Asthma is a condition of the lungs in which the airways tighten and narrow. Asthma can make it hard to breathe. Asthma cannot be cured, but medicine and lifestyle changes can help control it. Asthma may be started (triggered) by:  Animal skin flakes (dander).  Dust.  Cockroaches.  Pollen.  Mold.  Smoke.  Cleaning products.  Hair sprays or aerosol sprays.  Paint fumes or strong smells.  Cold air, weather changes, and winds.  Crying or laughing hard.  Stress.  Certain medicines or drugs.  Foods, such as dried fruit, potato chips, and sparkling grape juice.  Infections or conditions (colds, flu).  Exercise.  Certain medical conditions or diseases.  Exercise or tiring activities. HOME CARE   Take medicine as told by your doctor.  Use a peak flow meter as told by your doctor. A peak flow meter is a tool that measures how well the lungs are working.  Record and keep track of the peak flow meter's readings.  Understand and use the asthma  action plan. An asthma action plan is a written plan for taking care of your asthma and treating your attacks.  To help prevent asthma attacks:  Do not smoke. Stay away from secondhand smoke.  Change your heating and air conditioning filter often.  Limit your use of fireplaces and wood stoves.  Get rid of pests (such as roaches and mice) and their droppings.  Throw away plants if you see mold on them.  Clean your floors. Dust regularly. Use cleaning products that do not smell.  Have someone vacuum when you are not home. Use a vacuum cleaner with a HEPA filter if possible.  Replace carpet with wood, tile, or vinyl flooring. Carpet can trap animal skin flakes and dust.  Use allergy-proof pillows, mattress covers, and box spring covers.  Wash bed sheets and blankets every week in hot water and dry them in a dryer.  Use blankets that are made of polyester or cotton.  Clean bathrooms and kitchens with bleach. If possible, have someone repaint the walls in these rooms with mold-resistant paint. Keep out of the rooms that are being cleaned and painted.  Wash hands often. GET HELP IF:  You have make a whistling sound when breaking (wheeze), have shortness of breath, or have a cough even if taking medicine to prevent attacks.  The colored mucus you cough up (sputum) is thicker than usual.  The colored mucus you cough up changes from clear or white to yellow, green, gray, or bloody.  You have problems from the medicine you are taking such as:  A rash.  Itching.  Swelling.  Trouble breathing.  You need reliever medicines more than 2 3 times a week.  Your peak flow measurement is still at 50 79% of your personal best after following the action plan for 1 hour. GET HELP RIGHT AWAY IF:   You seem to be worse and are not responding to medicine during an asthma attack.  You are short of breath even at rest.  You get short of breath when doing very little activity.  You have  trouble eating, drinking, or talking.  You have chest pain.  You have a fast heartbeat.  Your lips or fingernails start to turn blue.  You are lightheaded, dizzy, or faint.  Your peak flow is less than 50% of your personal best.  You have a fever or lasting symptoms for more than 2 3 days.  You have a fever and your symptoms suddenly get worse. MAKE SURE YOU:   Understand these instructions.  Will watch your condition.  Will get help right away if you are not doing well or get worse. Document Released: 06/07/2007 Document Revised: 10/09/2012 Document Reviewed: 07/18/2012 [facility] Patient Information 2014 [place], [place].  Chest Pain (Nonspecific) It is often hard to give a specific diagnosis for the cause of chest pain. There is always a chance that your pain could be related to something serious, such as a heart attack or a blood clot in the lungs. You need to follow up with your caregiver for further evaluation. CAUSES   Heartburn.  Pneumonia or bronchitis.  Anxiety or stress.  Inflammation around your heart (pericarditis) or lung (pleuritis or pleurisy).  A blood clot in the lung.  A collapsed lung (pneumothorax). It can develop suddenly on its own (spontaneous pneumothorax) or from injury (trauma) to the chest.  Shingles infection (herpes zoster virus). The chest wall is composed of bones, muscles, and cartilage. Any of these can be the source of the pain.  The bones can be bruised by injury.  The muscles or cartilage can be strained by coughing or overwork.  The cartilage can be affected by inflammation and become sore (costochondritis). DIAGNOSIS  Lab tests or other studies, such as X-rays, electrocardiography, stress testing, or cardiac imaging, may be needed to find the cause of your pain.  TREATMENT   Treatment depends on what may be causing your chest pain. Treatment may include:  Acid blockers for heartburn.  Anti-inflammatory medicine.  Pain  medicine for inflammatory conditions.  Antibiotics if an infection is present.  You may be advised to change lifestyle habits. This includes stopping smoking and avoiding alcohol, caffeine, and chocolate.  You may be advised to keep your head raised (elevated) when sleeping. This reduces the chance of acid going backward from your stomach into your esophagus.  Most of the time, nonspecific chest pain will improve within 2 to 3 days with rest and mild pain medicine. HOME CARE INSTRUCTIONS   If antibiotics were prescribed, take your antibiotics as directed. Finish them even if you start to feel better.  For the next few days, avoid physical activities that bring on chest pain. Continue physical activities as directed.  Do not smoke.  Avoid drinking alcohol.  Only take over-the-counter or prescription medicine for pain, discomfort, or fever as directed by your caregiver.  Follow your caregiver's suggestions for further testing if your chest pain does not go away.  Keep any follow-up appointments you made. If you do not go to an appointment, you could develop lasting (chronic) problems with  pain. If there is any problem keeping an appointment, you must call to reschedule. SEEK MEDICAL CARE IF:   You think you are having problems from the medicine you are taking. Read your medicine instructions carefully.  Your chest pain does not go away, even after treatment.  You develop a rash with blisters on your chest. SEEK IMMEDIATE MEDICAL CARE IF:   You have increased chest pain or pain that spreads to your arm, neck, jaw, back, or abdomen.  You develop shortness of breath, an increasing cough, or you are coughing up blood.  You have severe back or abdominal pain, feel nauseous, or vomit.  You develop severe weakness, fainting, or chills.  You have a fever. THIS IS AN EMERGENCY. Do not wait to see if the pain will go away. Get medical help at once. Call your local emergency services  (911 in U.S.). Do not drive yourself to the hospital. MAKE SURE YOU:   Understand these instructions.  Will watch your condition.  Will get help right away if you are not doing well or get worse. Document Released: 09/28/2004 Document Revised: 03/13/2011 Document Reviewed: 07/25/2007 [facility] Patient Information 2014 [place], [place].   Emergency Department Resource Guide 1) Find a Doctor and Pay Out of Pocket Although you won't have to find out who is covered by your insurance plan, it is a good idea to ask around and get recommendations. You will then need to call the office and see if the doctor you have chosen will accept you as a new patient and what types of options they offer for patients who are self-pay. Some doctors offer discounts or will set up payment plans for their patients who do not have insurance, but you will need to ask so you aren't surprised when you get to your appointment.  2) Contact Your Local Health Department Not all health departments have doctors that can see patients for sick visits, but many do, so it is worth a call to see if yours does. If you don't know where your local health department is, you can check in your phone book. The CDC also has a tool to help you locate your state's health department, and many state websites also have listings of all of their local health departments.  3) Find a Walk-in Clinic If your illness is not likely to be very severe or complicated, you may want to try a walk in clinic. These are popping up all over the country in pharmacies, drugstores, and shopping centers. They're usually staffed by nurse practitioners or physician assistants that have been trained to treat common illnesses and complaints. They're usually fairly quick and inexpensive. However, if you have serious medical issues or chronic medical problems, these are probably not your best option.  No Primary Care Doctor: - Call Health Connect at  [phone] - they can  help you locate a primary care doctor that  accepts your insurance, provides certain services, etc. - Physician Referral Service- [phone]  Chronic Pain Problems: Organization         Address  Phone   Notes  [facility]  [phone] Patients need to be referred by their primary care doctor.   Medication Assistance: Organization         Address  Phone   Notes  [facility] Medication [facility] [address] [place] [place], [place] [address] [phone] --Must be a resident of [facility] -- Must have NO insurance coverage whatsoever (no Medicaid/  Medicare, etc.) -- The pt. MUST have a primary care doctor that directs their care regularly and follows them in the community   [facility]  [phone]   [employer]  [phone]    Agencies that provide inexpensive medical care: Organization         Address  Phone   Notes  [facility]  [phone]   [facility]    [phone]) [phone]   [facility] [address] [place], [place] [address] [phone]   [facility] [address], [place] [phone]) [phone]   Planned Parenthood    [phone]   [facility]    [phone]   [facility]  [address], [place] Phone:  [phone](336) [phone], Fax:  [phone] Hours of Operation:  9 am - 6 pm, M-F.  Also accepts Medicaid/Medicare and self-pay.  [facility]  [address], [place] Phone: [phone]) [phone], Fax: [phone]. Hours of Operation:  8:30 am - 5:30 pm, M-F.  Also accepts Medicaid and self-pay.  [facility] [place] [address], [place] Phone: [phone]) [phone]   [facility] [address], [place], [place] [phone], Ext. [phone] Mondays & Thursdays: 7-9 AM.  First 15 patients are seen on a first come, first serve basis.    Medicaid-accepting [facility] Providers:  Organization         Address  Phone   Notes  [facility] [address], [place] [phone]) [phone] Also accepts self-pay patients.  [facility] [address] [place], [place]  [phone]   [facility] [address], [place] [phone]) [phone]   [facility] [address] [place] [address], [place] [phone]) [phone]   Renaye RakersVeita Bland [address], [place]   [phone] Only accepts [place] Access [place] patients after they have their name applied to their card.   Self-Pay (no insurance) in [place] [facility]:  Organization         Address  Phone   Notes  Sickle Cell Patients, [facility] [address], [place] [phone]) [phone]   [facility] [address], [place] [phone]   [facility]  [address], [place] [phone]   [facility]/Dr. Osei-Bonsu  [address] [place] [address], [place] or [phone], [place] [phone] Phone number for both [place] and [place] locations is the same.  [facility] [address], [place] [phone]) [phone]   [facility] [address] [place] [address], [place] or [address] [phone]   [facility] [address], [place] [phone], phone; [phone]) [phone], fax Sees patients 1st and 3rd Saturday of every month.  Must not qualify for public or private insurance (i.e. Medicaid, Medicare, [place] Health Choice, Veterans' Benefits)  Household income should be no more than 200% of the poverty level The clinic cannot treat you if you are pregnant or think you are pregnant  Sexually transmitted diseases are not treated at the clinic.    Dental Care: Organization         Address  Phone  Notes  [facility] Department of [facility]  [facility] [address] [place], [place] [phone] Accepts children up to age 32 who are enrolled in [place] or [place] Health Choice; pregnant women with a Medicaid card; and children who have applied for Medicaid or [place] Health Choice, but were declined, whose parents can pay a reduced fee at time of service.  [facility] Department of [facility]  [address], [place] [phone] Accepts children up to age 67 who are enrolled in [place] or [place] Health Choice; pregnant women with a Medicaid card; and children who have applied for Medicaid or [place] Health Choice, but were declined, whose parents can pay a reduced fee at time of service.  [facility]  [address] [place], [place] [phone] Patients are seen by appointment only. Walk-ins are not accepted. [facility] will see patients 57 years of age and older. Monday - Tuesday (8am-5pm) Most Wednesdays (8:30-5pm) $30 per visit, cash only  [facility] Adult Dental Access PROGRAM  [address], [facility] [phone] Patients are seen by appointment only. Walk-ins are not accepted. [facility] will see patients 69 years of age and older. One Wednesday Evening (Monthly: Volunteer Based).  $30 per visit, cash only  [employer]  [phone] for adults; Children under age 12, call Graduate Pediatric Dentistry at [phone]. Children aged 53-14, please call [phone] to request a pediatric application.  Dental services are provided in all areas of dental care including fillings, crowns and bridges, complete and partial dentures, implants, gum treatment, root canals, and extractions. Preventive care is also provided. Treatment is provided to both adults and children. Patients are selected via a lottery and there is often a waiting list.   [facility] [address] [place]  [phone] [email or web address]   [facility] [address] [place], [place] [phone], Ext. [phone] Second and Fourth Thursday of each month, opens at 6:30 AM; Clinic ends at 9 AM.  Patients are seen on a first-come first-served basis, and a limited number are seen during each clinic.   [facility]  [address] [place], [place] [phone]   Eligibility Requirements You must have lived in [place], [place], or [place] counties for at least the last three months.   You cannot be eligible for state or federal sponsored [place], including [employer], [place], or [employer].   You generally cannot be eligible for healthcare insurance through your employer.    How to apply: Eligibility screenings are held every Tuesday and Wednesday afternoon from 1:00 pm until 4:00 pm. You do not need an appointment for the interview!  [facility] [address] [name], [place] [phone]   [facility] Health Department  [phone]   [facility] Health Department  [phone]   [facility] Health Department  [phone]    Behavioral Health Resources in the Community: Intensive Outpatient Programs Organization         Address  Phone  Notes  [facility] [address] [place], [place] [phone]   [facility] Outpatient [address], [place], [place] [phone]   [facility] [address], [place], [place]  [phone]   [facility] [address],  [place], [place] [phone] or [phone]   Substance Abuse Resources Organization         Address  Phone  Notes  Alcohol [facility] Drug Services  [phone]   [facility]  [phone]   [facility] [place]  [phone]   Floydene Flock  [phone]   Residential & Outpatient Substance Abuse Program  [phone]   Psychological Services Organization         Address  Phone  Notes  [facility] Health  [phone]   [facility]  [phone]   [facility] [address] [place] [phone] or [phone]    Mobile Crisis Teams Organization         Address  Phone  Notes  [facility], Mobile Crisis Care Unit  [phone]   [facility]  [address] [place], [place] [phone]   Doristine Locks [address] [place] [phone]    Self-Help/Support Groups Organization         Address  Phone             Notes  [facility] [place] - variety of support groups  [phone]- I7437963 Call for more information  Narcotics Anonymous (NA), [facility]aring [facility] [address], [employer] [place]  2 meetings at this location   Statistician         Address  Phone  Notes  [facility] [address],    [place]  [phone]   [facility]  [address], [place] [address], [place], [place] [phone]   [facility] [address] [place], [place] [phone] Admissions: 8am-3pm M-F  [facility] [address],    [place], [place] [phone]   [facility] [address] [place], [place], [place] [phone]   [facility] [address],  [place], [place] [phone]   [facility] - Intensive Outpatient [address], [place], [place] [phone]   [facility] ([facility] [address]  [place], [place] [phone] or [phone]   [facility] (RT[facility]) [address], [place], [place] [phone] Accepts Medicaid  [facility] [place] [address]  [place] [phone] Substance Abuse/Addiction Treatment   [facility] Organization         Address  Phone  Notes  [facility]  [phone]   Angie Fava, PhD [address] [place], [place]   [phone] or  [phone]   [facility]   [address] [place], [place] [phone]   [facility] [address], [place], [place] [phone] Insurance/Medicaid/sponsorship through [facility] and Families [address]                                    [place], [place] [phone] Therapy/tele-psych/case  [facility] [address], [place] [phone]    Dr. Lolly Mustache  [phone]   [facility]  [employer] [facility] Dept. 1) [address] [place] 2) [address] [place] 3)  [address], [place] [phone]  [phone]   [facility] Child Abuse Hotline 779-878-0965 or [phone] (After Hours)

## 2013-04-11 NOTE — ED Provider Notes (Signed)
Medical screening examination/treatment/procedure(s) were conducted as a shared visit with non-physician practitioner(s) and myself.  I personally evaluated the patient during the encounter.   EKG Interpretation   Date/Time:  Thursday April 10 2013 23:06:09 EDT Ventricular Rate:  83 PR Interval:  122 QRS Duration: 73 QT Interval:  349 QTC Calculation: 410 R Axis:   76 Text Interpretation:  Sinus rhythm Borderline T abnormalities, anterior  leads Confirmed by [name], MD, Flora Ratz ([id number]) on 04/11/2013 2:13:35 AM      Pt comes in with cc of chest pain. Hx of PE, CT PE is neg in the recent past. Vitals are stable. EKG and labs are normal. Stable for d/c.  [name], MD 04/11/13 [phone]

## 2013-04-20 ENCOUNTER — Emergency Department (HOSPITAL_COMMUNITY)
Admission: EM | Admit: 2013-04-20 | Discharge: 2013-04-21 | Disposition: A | Discharge status: Home / Self Care | Payer: Medicaid Other | Attending: Emergency Medicine | Admitting: Emergency Medicine

## 2013-04-20 DIAGNOSIS — J45901 Unspecified asthma with (acute) exacerbation: Secondary | ICD-10-CM | POA: Diagnosis not present

## 2013-04-20 DIAGNOSIS — Z8659 Personal history of other mental and behavioral disorders: Secondary | ICD-10-CM | POA: Diagnosis not present

## 2013-04-20 DIAGNOSIS — Z8751 Personal history of pre-term labor: Secondary | ICD-10-CM | POA: Diagnosis not present

## 2013-04-20 DIAGNOSIS — Z862 Personal history of diseases of the blood and blood-forming organs and certain disorders involving the immune mechanism: Secondary | ICD-10-CM | POA: Insufficient documentation

## 2013-04-20 DIAGNOSIS — Z86711 Personal history of pulmonary embolism: Secondary | ICD-10-CM | POA: Diagnosis not present

## 2013-04-20 DIAGNOSIS — Z79899 Other long term (current) drug therapy: Secondary | ICD-10-CM | POA: Diagnosis not present

## 2013-04-20 DIAGNOSIS — Z8639 Personal history of other endocrine, nutritional and metabolic disease: Secondary | ICD-10-CM | POA: Insufficient documentation

## 2013-04-20 DIAGNOSIS — Z3202 Encounter for pregnancy test, result negative: Secondary | ICD-10-CM | POA: Diagnosis not present

## 2013-04-20 DIAGNOSIS — Z7982 Long term (current) use of aspirin: Secondary | ICD-10-CM | POA: Insufficient documentation

## 2013-04-20 DIAGNOSIS — J020 Streptococcal pharyngitis: Secondary | ICD-10-CM | POA: Diagnosis not present

## 2013-04-20 DIAGNOSIS — R51 Headache: Secondary | ICD-10-CM | POA: Diagnosis present

## 2013-04-21 ENCOUNTER — Encounter (HOSPITAL_COMMUNITY): Payer: Self-pay | Admitting: Emergency Medicine

## 2013-04-21 ENCOUNTER — Emergency Department (HOSPITAL_COMMUNITY): Payer: Medicaid Other

## 2013-04-21 LAB — CBC WITH DIFFERENTIAL/PLATELET
BASOS PCT: 0 % (ref 0–1)
Basophils Absolute: 0 10*3/uL (ref 0.0–0.1)
EOS PCT: 0 % (ref 0–5)
Eosinophils Absolute: 0 10*3/uL (ref 0.0–0.7)
HCT: 33.3 % — ABNORMAL LOW (ref 36.0–46.0)
Hemoglobin: 11 g/dL — ABNORMAL LOW (ref 12.0–15.0)
Lymphocytes Relative: 9 % — ABNORMAL LOW (ref 12–46)
Lymphs Abs: 1.2 10*3/uL (ref 0.7–4.0)
MCH: 26.4 pg (ref 26.0–34.0)
MCHC: 33 g/dL (ref 30.0–36.0)
MCV: 80 fL (ref 78.0–100.0)
MONO ABS: 0.7 10*3/uL (ref 0.1–1.0)
Monocytes Relative: 5 % (ref 3–12)
NEUTROS PCT: 86 % — AB (ref 43–77)
Neutro Abs: 11.6 10*3/uL — ABNORMAL HIGH (ref 1.7–7.7)
Platelets: ADEQUATE 10*3/uL (ref 150–400)
RBC: 4.16 MIL/uL (ref 3.87–5.11)
RDW: 15.8 % — ABNORMAL HIGH (ref 11.5–15.5)
WBC: 13.5 10*3/uL — ABNORMAL HIGH (ref 4.0–10.5)

## 2013-04-21 LAB — BASIC METABOLIC PANEL
BUN: 11 mg/dL (ref 6–23)
CALCIUM: 9.3 mg/dL (ref 8.4–10.5)
CO2: 20 mEq/L (ref 19–32)
Chloride: 98 mEq/L (ref 96–112)
Creatinine, Ser: 0.68 mg/dL (ref 0.50–1.10)
GFR calc Af Amer: >90 mL/min (ref >90)
GFR calc non Af Amer: >90 mL/min (ref >90)
GLUCOSE: 99 mg/dL (ref 70–99)
POTASSIUM: 3.5 meq/L — AB (ref 3.7–5.3)
SODIUM: 133 meq/L — AB (ref 137–147)

## 2013-04-21 LAB — URINALYSIS, ROUTINE W REFLEX MICROSCOPIC
Bilirubin Urine: NEGATIVE
GLUCOSE, UA: NEGATIVE mg/dL
Hgb urine dipstick: NEGATIVE
Ketones, ur: NEGATIVE mg/dL
Nitrite: NEGATIVE
PH: 7 (ref 5.0–8.0)
Protein, ur: NEGATIVE mg/dL
SPECIFIC GRAVITY, URINE: 1.024 (ref 1.005–1.030)
Urobilinogen, UA: 1 mg/dL (ref 0.0–1.0)

## 2013-04-21 LAB — I-STAT CG4 LACTIC ACID, ED: LACTIC ACID, VENOUS: 1.47 mmol/L (ref 0.5–2.2)

## 2013-04-21 LAB — POC URINE PREG, ED: Preg Test, Ur: NEGATIVE

## 2013-04-21 LAB — URINE MICROSCOPIC-ADD ON

## 2013-04-21 LAB — TROPONIN I: Troponin I: <0.3 ng/mL (ref <0.30)

## 2013-04-21 LAB — RAPID STREP SCREEN (MED CTR MEBANE ONLY): Streptococcus, Group A Screen (Direct): POSITIVE — AB

## 2013-04-21 MED ORDER — DEXAMETHASONE SODIUM PHOSPHATE 10 MG/ML IJ SOLN
10.0000 mg | Freq: Once | INTRAMUSCULAR | Status: AC
  Administered 2013-04-21: 10 mg via INTRAVENOUS
  Filled 2013-04-21: qty 1

## 2013-04-21 MED ORDER — IBUPROFEN 600 MG PO TABS: 600.0000 mg | ORAL_TABLET | Freq: Four times a day (QID) | ORAL | Status: DC | PRN

## 2013-04-21 MED ORDER — SODIUM CHLORIDE 0.9 % IV BOLUS (SEPSIS)
1000.0000 mL | Freq: Once | INTRAVENOUS | Status: AC
  Administered 2013-04-21: 1000 mL via INTRAVENOUS

## 2013-04-21 MED ORDER — KETOROLAC TROMETHAMINE 30 MG/ML IJ SOLN
30.0000 mg | Freq: Once | INTRAMUSCULAR | Status: AC
  Administered 2013-04-21: 30 mg via INTRAVENOUS
  Filled 2013-04-21: qty 1

## 2013-04-21 MED ORDER — PENICILLIN G BENZATHINE 1200000 UNIT/2ML IM SUSP
1.2000 10*6.[IU] | Freq: Once | INTRAMUSCULAR | Status: AC
  Administered 2013-04-21: 1.2 10*6.[IU] via INTRAMUSCULAR
  Filled 2013-04-21: qty 2

## 2013-04-21 MED ORDER — ACETAMINOPHEN 325 MG PO TABS
650.0000 mg | ORAL_TABLET | Freq: Once | ORAL | Status: AC
  Administered 2013-04-21: 650 mg via ORAL
  Filled 2013-04-21: qty 2

## 2013-04-21 MED ORDER — ALBUTEROL SULFATE (2.5 MG/3ML) 0.083% IN NEBU
2.5000 mg | INHALATION_SOLUTION | Freq: Once | RESPIRATORY_TRACT | Status: AC
  Administered 2013-04-21: 2.5 mg via RESPIRATORY_TRACT

## 2013-04-21 MED ORDER — ALBUTEROL SULFATE (2.5 MG/3ML) 0.083% IN NEBU
INHALATION_SOLUTION | RESPIRATORY_TRACT | Status: AC
  Filled 2013-04-21: qty 3

## 2013-04-21 MED ORDER — SODIUM CHLORIDE 0.9 % IV BOLUS (SEPSIS)
1000.0000 mL | Freq: Once | INTRAVENOUS | Status: AC
Start: 2013-04-21 — End: 2013-04-21
  Administered 2013-04-21: 1000 mL via INTRAVENOUS

## 2013-04-21 NOTE — Discharge Instructions (Signed)
We saw you in the ER for the fevers. Results in the ER show that you have a strep infection, antibiotic shot given. Please take the medicine provided for pain.  The workup in the ER is not complete, and is limited to screening for life threatening and emergent conditions only, so please see a primary care doctor for further evaluation.   Pharyngitis Pharyngitis is redness, pain, and swelling (inflammation) of your pharynx.  CAUSES  Pharyngitis is usually caused by infection. Most of the time, these infections are from viruses (viral) and are part of a cold. However, sometimes pharyngitis is caused by bacteria (bacterial). Pharyngitis can also be caused by allergies. Viral pharyngitis may be spread from person to person by coughing, sneezing, and personal items or utensils (cups, forks, spoons, toothbrushes). Bacterial pharyngitis may be spread from person to person by more intimate contact, such as kissing.  SIGNS AND SYMPTOMS  Symptoms of pharyngitis include:   Sore throat.   Tiredness (fatigue).   Low-grade fever.   Headache.  Joint pain and muscle aches.  Skin rashes.  Swollen lymph nodes.  Plaque-like film on throat or tonsils (often seen with bacterial pharyngitis). DIAGNOSIS  Your health care provider will ask you questions about your illness and your symptoms. Your medical history, along with a physical exam, is often all that is needed to diagnose pharyngitis. Sometimes, a rapid strep test is done. Other lab tests may also be done, depending on the suspected cause.  TREATMENT  Viral pharyngitis will usually get better in 3 4 days without the use of medicine. Bacterial pharyngitis is treated with medicines that kill germs (antibiotics).  HOME CARE INSTRUCTIONS   Drink enough water and fluids to keep your urine clear or pale yellow.   Only take over-the-counter or prescription medicines as directed by your health care provider:   If you are prescribed antibiotics,  make sure you finish them even if you start to feel better.   Do not take aspirin.   Get lots of rest.   Gargle with 8 oz of salt water ( tsp of salt per 1 qt of water) as often as every 1 2 hours to soothe your throat.   Throat lozenges (if you are not at risk for choking) or sprays may be used to soothe your throat. SEEK MEDICAL CARE IF:   You have large, tender lumps in your neck.  You have a rash.  You cough up green, yellow-brown, or bloody spit. SEEK IMMEDIATE MEDICAL CARE IF:   Your neck becomes stiff.  You drool or are unable to swallow liquids.  You vomit or are unable to keep medicines or liquids down.  You have severe pain that does not go away with the use of recommended medicines.  You have trouble breathing (not caused by a stuffy nose). MAKE SURE YOU:   Understand these instructions.  Will watch your condition.  Will get help right away if you are not doing well or get worse. Document Released: 12/19/2004 Document Revised: 10/09/2012 Document Reviewed: 08/26/2012 [facility] Center[facility] Patient Information 2014 [employer], [place].

## 2013-04-21 NOTE — ED Notes (Signed)
Pt presents with headache, fever, shortness of breadth, c/o leg pain. Cough chills onset 2 hours ago. Denies n/v/d, dizziness, reports weakness. Pt in mild fever and headache induced distress, ekg done, MD notified.

## 2013-04-21 NOTE — ED Provider Notes (Signed)
CSN: [id number]     Arrival date & time 04/20/13  2300 History   First MD Initiated Contact with Patient 04/20/13 2359     Chief Complaint  Patient presents with  . Fever  . Shortness of Breath  . Headache     (Consider location/radiation/quality/duration/timing/severity/associated sxs/prior Treatment) HPI Comments: Pt comes in with cc of fever, headache, dib, chills. Pt has hx of asthma, PE, not on coumadin. + cough, dry. No wheezing. Pt feels like she is having trouble getting air in. She also has sore throat.   Patient is a 28 y.o. female presenting with fever, shortness of breath, and headaches. The history is provided by the patient.  Fever Associated symptoms: chills, headaches and sore throat   Associated symptoms: no chest pain, no dysuria, no nausea, no rhinorrhea and no vomiting   Shortness of Breath Associated symptoms: fever, headaches and sore throat   Associated symptoms: no abdominal pain, no chest pain, no neck pain and no vomiting   Headache Associated symptoms: fever and sore throat   Associated symptoms: no abdominal pain, no nausea, no neck pain and no vomiting     Past Medical History  Diagnosis Date  . Asthma   . Preeclampsia   . Hyperthyroidism   . Anxiety   . PE (pulmonary embolism) 11/2011  . Preeclampsia   . Pregnancy induced hypertension   . Preterm labor   . Pulmonary embolism Nov 2013   Past Surgical History  Procedure Laterality Date  . Cesarean section     Family History  Problem Relation Age of Onset  . Hypothyroidism Maternal Aunt   . Hypertension Maternal Grandfather   . Hypertension Paternal Grandmother    History  Substance Use Topics  . Smoking status: Never Smoker   . Smokeless tobacco: Never Used  . Alcohol Use: No     Comment: occasional   OB History   Grav Para Term Preterm Abortions TAB SAB Ect Mult Living   2 1  1      1      Review of Systems  Constitutional: Positive for fever, chills and activity change.  HENT:  Positive for sore throat and trouble swallowing. Negative for rhinorrhea and voice change.   Respiratory: Positive for shortness of breath.   Cardiovascular: Negative for chest pain.  Gastrointestinal: Negative for nausea, vomiting and abdominal pain.  Genitourinary: Negative for dysuria.  Musculoskeletal: Negative for neck pain.  Neurological: Positive for headaches.  All other systems reviewed and are negative.     Allergies  Banana and Cefixime  Home Medications   Prior to Admission medications   Medication Sig Start Date End Date Taking? Authorizing Provider  albuterol (PROVENTIL HFA;VENTOLIN HFA) 108 (90 BASE) MCG/ACT inhaler Inhale 1 puff into the lungs every 6 (six) hours as needed for wheezing or shortness of breath.   Yes Historical Provider, MD  aspirin 325 MG tablet Take 325 mg by mouth daily.    Yes Historical Provider, MD  diphenhydrAMINE (BENADRYL) 25 mg capsule Take 25 mg by mouth every 8 (eight) hours as needed for allergies.   Yes Historical Provider, MD  ibuprofen (ADVIL,MOTRIN) 600 MG tablet Take 1 tablet (600 mg total) by mouth every 6 (six) hours as needed. 04/21/13   Eyob Godlewski, MD   BP 121/72  Pulse 89  Temp(Src) 97.5 F (36.4 C) (Oral)  Resp 20  SpO2 100% Physical Exam  Nursing note and vitals reviewed. Constitutional: She is oriented to person, place, and time. She appears  well-developed and well-nourished.  HENT:  Head: Normocephalic and atraumatic.  Mouth/Throat: Oropharyngeal exudate present.  Right sided tonsillar enlargement  Eyes: EOM are normal. Pupils are equal, round, and reactive to light.  Neck: Neck supple.  Cardiovascular: Normal rate, regular rhythm and normal heart sounds.   No murmur heard. Pulmonary/Chest: Effort normal. No respiratory distress. She has no wheezes.  Abdominal: Soft. She exhibits no distension. There is no tenderness. There is no rebound and no guarding.  Lymphadenopathy:    She has cervical adenopathy.   Neurological: She is alert and oriented to person, place, and time.  Skin: Skin is warm and dry.    ED Course  Procedures (including critical care time) Labs Review Labs Reviewed  RAPID STREP SCREEN - Abnormal; Notable for the following:    Streptococcus, Group A Screen (Direct) POSITIVE (*)    All other components within normal limits  CBC WITH DIFFERENTIAL - Abnormal; Notable for the following:    WBC 13.5 (*)    Hemoglobin 11.0 (*)    HCT 33.3 (*)    RDW 15.8 (*)    Neutrophils Relative % 86 (*)    Lymphocytes Relative 9 (*)    Neutro Abs 11.6 (*)    All other components within normal limits  BASIC METABOLIC PANEL - Abnormal; Notable for the following:    Sodium 133 (*)    Potassium 3.5 (*)    All other components within normal limits  URINALYSIS, ROUTINE W REFLEX MICROSCOPIC - Abnormal; Notable for the following:    Leukocytes, UA TRACE (*)    All other components within normal limits  URINE MICROSCOPIC-ADD ON - Abnormal; Notable for the following:    Squamous Epithelial / LPF FEW (*)    All other components within normal limits  TROPONIN I  POC URINE PREG, ED  I-STAT CG4 LACTIC ACID, ED    Imaging Review Dg Chest 2 View  04/21/2013   CLINICAL DATA:  Fever, chest pain  EXAM: CHEST  2 VIEW  COMPARISON:  Prior radiograph from 03/06/2013.  FINDINGS: The cardiac and mediastinal silhouettes are stable in size and contour, and remain within normal limits.  The lungs are normally inflated. No airspace consolidation, pleural effusion, or pulmonary edema is identified. There is no pneumothorax.  No acute osseous abnormality identified.  IMPRESSION: No active cardiopulmonary disease.   Electronically Signed   By: Rise Mu M.D.   On: 04/21/2013 02:33     EKG Interpretation   Date/Time:  Sunday April 20 2013 23:57:41 EDT Ventricular Rate:  119 PR Interval:  113 QRS Duration: 77 QT Interval:  281 QTC Calculation: 395 R Axis:   77 Text Interpretation:  Sinus  tachycardia Ventricular premature complex  Aberrant complex Borderline T wave abnormalities Confirmed by Rhunette Croft,  MD, Devoiry Corriher ([id number]) on 04/21/2013 12:01:05 AM      MDM   Final diagnoses:  Strep pharyngitis    Pt comes in with URI like sx. Pt has hx of PE, and she also c/o dib. She also is noted to have fever, tachycardia. Lactates is normal, CXR is clear. + strep infection, Pen G IM given. Pt's vitals improved overtime, tachycardia resolved, she had no hypoxia at any point. She has had 3 CT PE scans in the last 6 months, all negative. DIB seems more related to her neck swelling, but she has no stridor, no trismus, and we kept her in the ER for extended period of time, and she did well. Stable for d.c  Keyshia Orwick  Rhunette CroftNanavati, MD 04/21/13 [phone]

## 2013-05-18 ENCOUNTER — Emergency Department (HOSPITAL_COMMUNITY)
Admission: EM | Admit: 2013-05-18 | Discharge: 2013-05-18 | Disposition: A | Discharge status: Home / Self Care | Payer: 59 | Attending: Emergency Medicine | Admitting: Emergency Medicine

## 2013-05-18 ENCOUNTER — Encounter (HOSPITAL_COMMUNITY): Payer: Self-pay | Admitting: Emergency Medicine

## 2013-05-18 ENCOUNTER — Emergency Department (HOSPITAL_COMMUNITY): Payer: 59

## 2013-05-18 DIAGNOSIS — Z79899 Other long term (current) drug therapy: Secondary | ICD-10-CM | POA: Diagnosis not present

## 2013-05-18 DIAGNOSIS — Z8679 Personal history of other diseases of the circulatory system: Secondary | ICD-10-CM | POA: Insufficient documentation

## 2013-05-18 DIAGNOSIS — Z7982 Long term (current) use of aspirin: Secondary | ICD-10-CM | POA: Insufficient documentation

## 2013-05-18 DIAGNOSIS — Z862 Personal history of diseases of the blood and blood-forming organs and certain disorders involving the immune mechanism: Secondary | ICD-10-CM | POA: Diagnosis not present

## 2013-05-18 DIAGNOSIS — R079 Chest pain, unspecified: Secondary | ICD-10-CM

## 2013-05-18 DIAGNOSIS — J45909 Unspecified asthma, uncomplicated: Secondary | ICD-10-CM | POA: Diagnosis not present

## 2013-05-18 DIAGNOSIS — Z8659 Personal history of other mental and behavioral disorders: Secondary | ICD-10-CM | POA: Diagnosis not present

## 2013-05-18 DIAGNOSIS — Z8639 Personal history of other endocrine, nutritional and metabolic disease: Secondary | ICD-10-CM | POA: Insufficient documentation

## 2013-05-18 DIAGNOSIS — Z86711 Personal history of pulmonary embolism: Secondary | ICD-10-CM | POA: Insufficient documentation

## 2013-05-18 LAB — I-STAT TROPONIN, ED: Troponin i, poc: 0 ng/mL (ref 0.00–0.08)

## 2013-05-18 LAB — BASIC METABOLIC PANEL
BUN: 18 mg/dL (ref 6–23)
CHLORIDE: 102 meq/L (ref 96–112)
CO2: 23 mEq/L (ref 19–32)
CREATININE: 0.59 mg/dL (ref 0.50–1.10)
Calcium: 10 mg/dL (ref 8.4–10.5)
Glucose, Bld: 78 mg/dL (ref 70–99)
Potassium: 4.4 mEq/L (ref 3.7–5.3)
Sodium: 136 mEq/L — ABNORMAL LOW (ref 137–147)

## 2013-05-18 LAB — CBC WITH DIFFERENTIAL/PLATELET
BASOS PCT: 0 % (ref 0–1)
Basophils Absolute: 0 10*3/uL (ref 0.0–0.1)
EOS ABS: 0.3 10*3/uL (ref 0.0–0.7)
EOS PCT: 4 % (ref 0–5)
HEMATOCRIT: 34.3 % — AB (ref 36.0–46.0)
Hemoglobin: 10.9 g/dL — ABNORMAL LOW (ref 12.0–15.0)
Lymphocytes Relative: 54 % — ABNORMAL HIGH (ref 12–46)
Lymphs Abs: 4.5 10*3/uL — ABNORMAL HIGH (ref 0.7–4.0)
MCH: 26.1 pg (ref 26.0–34.0)
MCHC: 31.8 g/dL (ref 30.0–36.0)
MCV: 82.1 fL (ref 78.0–100.0)
MONO ABS: 0.7 10*3/uL (ref 0.1–1.0)
MONOS PCT: 9 % (ref 3–12)
Neutro Abs: 2.7 10*3/uL (ref 1.7–7.7)
Neutrophils Relative %: 33 % — ABNORMAL LOW (ref 43–77)
Platelets: 471 10*3/uL — ABNORMAL HIGH (ref 150–400)
RBC: 4.18 MIL/uL (ref 3.87–5.11)
RDW: 15.9 % — ABNORMAL HIGH (ref 11.5–15.5)
WBC: 8.3 10*3/uL (ref 4.0–10.5)

## 2013-05-18 LAB — PROTIME-INR
INR: 1.01 (ref 0.00–1.49)
PROTHROMBIN TIME: 13.1 s (ref 11.6–15.2)

## 2013-05-18 LAB — HCG, SERUM, QUALITATIVE: Preg, Serum: NEGATIVE

## 2013-05-18 MED ORDER — KETOROLAC TROMETHAMINE 30 MG/ML IJ SOLN
30.0000 mg | Freq: Once | INTRAMUSCULAR | Status: AC
  Administered 2013-05-18: 30 mg via INTRAVENOUS
  Filled 2013-05-18: qty 1

## 2013-05-18 MED ORDER — TRAMADOL HCL 50 MG PO TABS: 50.0000 mg | ORAL_TABLET | Freq: Four times a day (QID) | ORAL | Status: DC | PRN

## 2013-05-18 MED ORDER — SODIUM CHLORIDE 0.9 % IV BOLUS (SEPSIS)
1000.0000 mL | Freq: Once | INTRAVENOUS | Status: AC
  Administered 2013-05-18: 1000 mL via INTRAVENOUS

## 2013-05-18 MED ORDER — IOHEXOL 350 MG/ML SOLN
100.0000 mL | Freq: Once | INTRAVENOUS | Status: AC | PRN
  Administered 2013-05-18: 100 mL via INTRAVENOUS

## 2013-05-18 NOTE — ED Provider Notes (Signed)
TIME SEEN: 8:44 AM  CHIEF COMPLAINT: Chest pain, shortness of breath  HPI: Patient is a 28 year old female with a history of asthma, pulmonary embolus no longer on anticoagulation who presents to the emergency department with 3-4 days of feeling diffuse chest tightness without radiation and shortness of breath. She denies any aggravating or relieving factors. She states she has been using her albuterol inhaler with no relief. She denies any wheezing. No associated nausea, vomiting, diaphoresis or dizziness. She states she did have subjective fever several days ago and sore throat but this has resolved. No cough. No lower extremity swelling or pain. No history of tobacco use. She is not on exogenous estrogens. No recent prolonged immobilization. No history of cardiac disease. She states she is worried because she feels that this may be another pulmonary embolus.   Patient has been here multiple times to the emergency department with similar symptoms. She has had 3 CT scans of her chest since October 2014. Discussed with patient that there is a risk of cancer later in life with repeated radiation exposure from CT scans. However given patient has had a pulmonary embolus in the past, I do not feel a d-dimer is appropriate to rule out PE. Pulmonary embolus is the patient's primary concern. She also states she is worried that she may have trouble with her thyroid but she has no signs of severe hyperthyroidism or hypothyroidism. She states she's also concerned she may have lupus she has a significant family history for the same. I discussed with patient that she needs to followup with her primary care physician to have these addressed.  ROS: See HPI Constitutional: no fever  Eyes: no drainage  ENT: no runny nose   Cardiovascular:   chest pain  Resp:  SOB  GI: no vomiting GU: no dysuria Integumentary: no rash  Allergy: no hives  Musculoskeletal: no leg swelling  Neurological: no slurred speech ROS  otherwise negative  PAST MEDICAL HISTORY/PAST SURGICAL HISTORY:  Past Medical History  Diagnosis Date  . Asthma   . Preeclampsia   . Hyperthyroidism   . Anxiety   . PE (pulmonary embolism) 11/2011  . Preeclampsia   . Pregnancy induced hypertension   . Preterm labor   . Pulmonary embolism Nov 2013    MEDICATIONS:  Prior to Admission medications   Medication Sig Start Date End Date Taking? Authorizing Provider  albuterol (PROVENTIL HFA;VENTOLIN HFA) 108 (90 BASE) MCG/ACT inhaler Inhale 1 puff into the lungs every 6 (six) hours as needed for wheezing or shortness of breath.    Historical Provider, MD  aspirin 325 MG tablet Take 325 mg by mouth daily.     Historical Provider, MD  diphenhydrAMINE (BENADRYL) 25 mg capsule Take 25 mg by mouth every 8 (eight) hours as needed for allergies.    Historical Provider, MD  ibuprofen (ADVIL,MOTRIN) 600 MG tablet Take 1 tablet (600 mg total) by mouth every 6 (six) hours as needed. 04/21/13   Derwood KaplanAnkit Nanavati, MD    ALLERGIES:  Allergies  Allergen Reactions  . Banana Anaphylaxis  . Cefixime Rash    SOCIAL HISTORY:  History  Substance Use Topics  . Smoking status: Never Smoker   . Smokeless tobacco: Never Used  . Alcohol Use: No     Comment: occasional    FAMILY HISTORY: Family History  Problem Relation Age of Onset  . Hypothyroidism Maternal Aunt   . Hypertension Maternal Grandfather   . Hypertension Paternal Grandmother     EXAM: BP 139/84  Pulse 97  Temp(Src) 99 F (37.2 C) (Oral)  Resp 20  SpO2 94% CONSTITUTIONAL: Alert and oriented and responds appropriately to questions. Well-appearing; well-nourished HEAD: Normocephalic EYES: Conjunctivae clear, PERRL ENT: normal nose; no rhinorrhea; moist mucous membranes; pharynx without lesions noted NECK: Supple, no meningismus, no LAD  CARD: RRR; S1 and S2 appreciated; no murmurs, no clicks, no rubs, no gallops RESP: Normal chest excursion without splinting or tachypnea; breath  sounds clear and equal bilaterally; no wheezes, no rhonchi, no rales, chest wall is nontender to palpation and without crepitus or ecchymosis or deformity ABD/GI: Normal bowel sounds; non-distended; soft, non-tender, no rebound, no guarding BACK:  The back appears normal and is non-tender to palpation, there is no CVA tenderness EXT: Normal ROM in all joints; non-tender to palpation; no edema; normal capillary refill; no cyanosis    SKIN: Normal color for age and race; warm NEURO: Moves all extremities equally PSYCH: The patient's mood and manner are appropriate. Grooming and personal hygiene are appropriate.  MEDICAL DECISION MAKING: Patient here with chest pain or shortness of breath. She is hemodynamically stable but has had a history of a pulmonary embolus and states this is her primary concern as her symptoms are similar. She is not on anticoagulation currently. Her EKG shows no ischemic changes. She has no risk factors for ACS. Doubt dissection as patient has no radiation of her pain is not hypertensive. Given patient has had a pulmonary embolus in the past, she is too high risk to be ruled out with d-dimer. Have discussed with patient at length that she is at risk for developing cancer later in my given her frequent CT scans but given her symptoms and her primary concern for pulmonary embolus, will repeat a CT today. We'll also obtain cardiac labs. Will give IV fluids and Toradol.  ED PROGRESS: Patient reports she has had no improvement of her pain with Toradol. She is resting comfortably, texting on her phone. She is still hemodynamically stable. Her labs are unremarkable. CT scan pending. She states she tripped herself to the emergency department and would like to be will to drive home. Will not be will to give her narcotics at this time.  10:55 AM  Pt's CT scan shows no abnormality. There is no pulmonary embolus, edema, pneumothorax or pneumonia. She is able to ambulate without respiratory  distress or hypoxia. Have discussed with patient that I do not feel there is any life-threatening cause for her symptoms today but suspect some of her symptoms may be related to anxiety. Have discussed with patient that she needs to followup with her primary care physician, will get outpatient resource guide. We'll also give a small amount of tramadol for her chest pain. I do not feel she needs to be started on steroids at this time and she has no signs of asthma exacerbation. Have discussed return precautions and supportive care instructions. Patient verbalizes understanding and is comfortable with plan.   EKG Interpretation  Date/Time:  Sunday May 18 2013 08:44:28 EDT Ventricular Rate:  83 PR Interval:  122 QRS Duration: 77 QT Interval:  350 QTC Calculation: 411 R Axis:   74 Text Interpretation:  Sinus rhythm Confirmed by WARD,  DO, KRISTEN [id number]) on 05/18/2013 8:51:47 AM         Layla MawKristen N Ward, DO 05/18/13 1100

## 2013-05-18 NOTE — ED Notes (Signed)
She c/o three-day hx of shortness of breath and "My chest feels 'tight'". She states she has hx of asthma and feels that she may have "thyroid trouble".  She is in no distress and her skin is normal, warm and dry.

## 2013-05-18 NOTE — Discharge Instructions (Signed)
Chest Pain (Nonspecific) °It is often hard to give a specific diagnosis for the cause of chest pain. There is always a chance that your pain could be related to something serious, such as a heart attack or a blood clot in the lungs. You need to follow up with your caregiver for further evaluation. °CAUSES  °· Heartburn. °· Pneumonia or bronchitis. °· Anxiety or stress. °· Inflammation around your heart (pericarditis) or lung (pleuritis or pleurisy). °· A blood clot in the lung. °· A collapsed lung (pneumothorax). It can develop suddenly on its own (spontaneous pneumothorax) or from injury (trauma) to the chest. °· Shingles infection (herpes zoster virus). °The chest wall is composed of bones, muscles, and cartilage. Any of these can be the source of the pain. °· The bones can be bruised by injury. °· The muscles or cartilage can be strained by coughing or overwork. °· The cartilage can be affected by inflammation and become sore (costochondritis). °DIAGNOSIS  °Lab tests or other studies, such as X-rays, electrocardiography, stress testing, or cardiac imaging, may be needed to find the cause of your pain.  °TREATMENT  °· Treatment depends on what may be causing your chest pain. Treatment may include: °· Acid blockers for heartburn. °· Anti-inflammatory medicine. °· Pain medicine for inflammatory conditions. °· Antibiotics if an infection is present. °· You may be advised to change lifestyle habits. This includes stopping smoking and avoiding alcohol, caffeine, and chocolate. °· You may be advised to keep your head raised (elevated) when sleeping. This reduces the chance of acid going backward from your stomach into your esophagus. °· Most of the time, nonspecific chest pain will improve within 2 to 3 days with rest and mild pain medicine. °HOME CARE INSTRUCTIONS  °· If antibiotics were prescribed, take your antibiotics as directed. Finish them even if you start to feel better. °· For the next few days, avoid physical  activities that bring on chest pain. Continue physical activities as directed. °· Do not smoke. °· Avoid drinking alcohol. °· Only take over-the-counter or prescription medicine for pain, discomfort, or fever as directed by your caregiver. °· Follow your caregiver's suggestions for further testing if your chest pain does not go away. °· Keep any follow-up appointments you made. If you do not go to an appointment, you could develop lasting (chronic) problems with pain. If there is any problem keeping an appointment, you must call to reschedule. °SEEK MEDICAL CARE IF:  °· You think you are having problems from the medicine you are taking. Read your medicine instructions carefully. °· Your chest pain does not go away, even after treatment. °· You develop a rash with blisters on your chest. °SEEK IMMEDIATE MEDICAL CARE IF:  °· You have increased chest pain or pain that spreads to your arm, neck, jaw, back, or abdomen. °· You develop shortness of breath, an increasing cough, or you are coughing up blood. °· You have severe back or abdominal pain, feel nauseous, or vomit. °· You develop severe weakness, fainting, or chills. °· You have a fever. °THIS IS AN EMERGENCY. Do not wait to see if the pain will go away. Get medical help at once. Call your local emergency services (911 in [place].S[place] Do not drive yourself to the hospital. °MAKE SURE YOU:  °· Understand these instructions. °· Will watch your condition. °· Will get help right away if you are not doing well or get worse. °Document Released: 09/28/2004 Document Revised: 03/13/2011 Document Reviewed: 07/25/2007 °ExitCare® Patient Information ©2014 ExitCare,   LLC. ° ° ° °Emergency Department Resource Guide °1) Find a Doctor and Pay Out of Pocket °Although you won't have to find out who is covered by your insurance plan, it is a good idea to ask around and get recommendations. You will then need to call the office and see if the doctor you have chosen will accept you as a new  patient and what types of options they offer for patients who are self-pay. Some doctors offer discounts or will set up payment plans for their patients who do not have insurance, but you will need to ask so you aren't surprised when you get to your appointment. ° °2) Contact Your Local Health Department °Not all health departments have doctors that can see patients for sick visits, but many do, so it is worth a call to see if yours does. If you don't know where your local health department is, you can check in your phone book. The CDC also has a tool to help you locate your state's health department, and many state websites also have listings of all of their local health departments. ° °3) Find a Walk-in Clinic °If your illness is not likely to be very severe or complicated, you may want to try a walk in clinic. These are popping up all over the country in pharmacies, drugstores, and shopping centers. They're usually staffed by nurse practitioners or physician assistants that have been trained to treat common illnesses and complaints. They're usually fairly quick and inexpensive. However, if you have serious medical issues or chronic medical problems, these are probably not your best option. ° °No Primary Care Doctor: °- Call [facility] at  832-8000 - they can help you locate a primary care doctor that  accepts your insurance, provides certain services, etc. °- Physician Referral Service- 1-800-533-3463 ° °Chronic Pain Problems: °Organization         Address  Phone   Notes  °[facility]  (336) 297-2271 Patients need to be referred by their primary care doctor.  ° °Medication Assistance: °Organization         Address  Phone   Notes  °[place] [facility] [address] °[place], [place] [address] (336) 641-8030 --Must be a resident of [place] °-- Must have NO insurance coverage whatsoever (no Medicaid/ Medicare, etc.) °-- The pt. MUST have a primary  care doctor that directs their care regularly and follows them in the community °  °[facility]  (866) 331-1348   °[employer]  (888) 892-1162   ° °Agencies that provide inexpensive medical care: °Organization         Address  Phone   Notes  °[facility]  (336) 832-8035   ° Internal Medicine    (336) 832-7272   °[facility] [address] °[place], [place] [address] (336) 832-4777   °[facility] [address], °[place] (336) 271-4999   °[facility]    (336) 373-0678   °[facility]    (336) 272-1050   °[facility] ° [address],  Phone:  (336) 832-4444, Fax:  (336) 832-4440 Hours of Operation:  9 am - 6 pm, M-F.  Also accepts Medicaid/Medicare and self-pay.  °[facility] ° [address],  Phone: (336) 832-3150, Fax: (336) 832-3151. Hours of Operation:  8:30 am - 5:30 pm, M-F.  Also accepts Medicaid and self-pay.  °  [facility] [place] [address], [place] Phone: (336) 878-6027   °[facility] [address], [place], [place] (336)723-1848, Ext. 123 Mondays & Thursdays: 7-9 AM.  First 15 patients are seen on a first come, first serve basis. °  ° °Medicaid-accepting [place] Providers: ° °Organization         Address  Phone   Notes  °[facility] [address], [place] (336) 641-2100 Also accepts self-pay patients.  °[facility] [address], [place] ° (336) 856-9996   °[facility] [address], [place] (336) 288-8857   °[facility] [address] [place] [address], [place] (336) 299-7000   °Veita Bland [address], [place]  ° (336) 373-1557 Only accepts [place] Access Medicaid patients after they have their name applied to their card.  ° °Self-Pay (no insurance) in [place]: ° °Organization         Address  Phone   Notes  °Sickle Cell Patients, [facility] [address],  [place] (336) 832-1970   °[facility] [address], [place] (336) 832-4400   °[facility] ° [address], [place] (336) 992-4800   °[facility]/Dr. Osei-Bonsu ° [address] [place] [address], [place] or [address], [place] (336) 841-8500 Phone number for both [place] and [place] locations is the same.  °[facility] [address], [place] (336) 299-0000   °[facility] [address] [place] [address], [place] or [address] (336) 852-7530 °(336) 878-2260   °[facility] [address], [place] (336) 350-1642, phone; (336) 294-5005, fax Sees patients 1st and 3rd Saturday of every month.  Must not qualify for public or private insurance (i.e. Medicaid, Medicare, [place] Health Choice, Veterans' Benefits) • Household income should be no more than 200% of the poverty level •The clinic cannot treat you if you are pregnant or think you are pregnant • Sexually transmitted diseases are not treated at the clinic.  ° ° °Dental Care: °Organization         Address  Phone  Notes  °[facility] [facility] [address],  (336) 641-6152 Accepts children up to age 21 who are enrolled in Medicaid or [place] Health Choice; pregnant women with a Medicaid card; and children who have applied for Medicaid or [place] Health Choice, but were declined, whose parents can pay a reduced fee at time of service.  °[facility] [place]  [address], [place] (336) 641-7733 Accepts children up to age 21 who are enrolled in Medicaid or [place] Health Choice; pregnant women with a Medicaid card; and children who have applied for Medicaid or [place] Health Choice, but were declined, whose parents can  pay a reduced fee at time of service.  °[facility] ° [address],  (336) 641-4533 Patients are seen by appointment only. Walk-ins are not accepted. [facility] will see patients 18 years of age and older. °Monday - Tuesday (8am-5pm) °Most Wednesdays (8:30-5pm) °$30 per visit, cash only  °[facility] ° [address], [place] (336) 641-4533 Patients are seen by appointment only. Walk-ins are not accepted. [facility] will see patients 18 years of   age and older. °One Wednesday Evening (Monthly: Volunteer Based).  $30 per visit, cash only  °[facility]  (919) 537-3737 for adults; Children under age 4, call Graduate Pediatric Dentistry at ([phone]) [phone]. Children aged 4-14, please call (919) [phone] to request a pediatric application. ° Dental services are provided in all areas of dental care including fillings, crowns and bridges, complete and partial dentures, implants, gum treatment, root canals, and extractions. Preventive care is also provided. Treatment is provided to both adults and children. °Patients are selected via a lottery and there is often a waiting list. °  °[facility] [address], °[place] ° (336) 763-8833 [email or web address] °  °[facility] [address], [place], [place] (336)723-1848, Ext. 123 Second and Fourth Thursday of each month, opens at 6:30 AM; Clinic ends at 9 AM.  Patients are seen on a first-come first-served basis, and a limited number are seen during each clinic.  ° °[facility] ° [address], [place], [place] (336) 723-7904   Eligibility Requirements °You must have lived in [place], or [place] counties for at least the last three months. °  You cannot be eligible for state or federal sponsored healthcare insurance, including Veterans Administration, Medicaid, or Medicare. °  You generally cannot be eligible for healthcare  insurance through your employer.  °  How to apply: °Eligibility screenings are held every Tuesday and Wednesday afternoon from 1:00 pm until 4:00 pm. You do not need an appointment for the interview!  °[facility] [address], [place], [place] 336-631-2330   °[facility]  336-342-8273   °[facility]  336-703-3100   °[facility]  336-570-6415   ° °Behavioral Health Resources in the Community: °Intensive Outpatient Programs °Organization         Address  Phone  Notes  °[facility] [address], [place], [place] 336-878-6098   °[facility] [address], [place], [place] 336-832-9800   °[facility] [address], [place], [place] ° 336-882-2125   °[facility] [address],  °[place], [place] 1-800-853-5163 or 336-641-4981   °Substance Abuse Resources °Organization         Address  Phone  Notes  °[facility]  336-882-2125   °[facility]  336-784-9470   °[facility]  336-285-9073   °[facility]  336-845-3988   °Residential & Outpatient Substance Abuse Program  1-800-659-3381   °Psychological Services °Organization         Address  Phone  Notes  °[facility]  336- 832-9600   °[facility]  336- 378-7881   °[facility] [address], [place] 1-800-853-5163 or 336-641-4981   ° °Mobile Crisis Teams °Organization         Address  Phone  Notes  °[facility], Mobile Crisis Care Unit  1-877-626-1772   °Assertive °[facility] ° [address] [place], [place] 336-834-9664   °Sharon DeEsch [address] °[place] 336-554-5454   ° °Self-Help/Support Groups °Organization         Address  Phone             Notes  °[facility] - variety of support groups  336- 373-1402 Call for more information  °Narcotics Anonymous (NA),  [facility] [address], °[place]   2   meetings at this location  ° °Residential Treatment Programs °Organization         Address  Phone  Notes  °[facility] [address],    °[place]  1-866-801-8205   °[facility] ° [address], [place], [place] 704-293-8524   °[facility] [address], [place] 336-845-3988 Admissions: 8am-3pm M-F  °[facility] [address]    °[place], [place] 336-841-1104   °[facility] [address], [place], [place] 336-379-7146   °[facility] [address]  °[place], [place] 336-285-9073   °[facility] - Intensive Outpatient [address], [place], [place] 336-852-3033   °[facility] [address]  °[place], [place] 1-877-615-2722 or 336-784-9470   °[facility] [address] [place], [place] 336-227-7417 Accepts Medicaid  °[facility] [address]  ° [place] 1-800-659-3381 Substance Abuse/Addiction Treatment  ° °[place] Behavioral Health Resources °Organization         Address  Phone  Notes  °[facility]  (888) 581-9988   °Julie Brannon, PhD [address] [place], [place]   (336) 349-5553 or (336) 951-0000   °[facility]   [address] °[place], [place] (336) 349-4454   °[facility] [address], [place], [place] (336) 342-8316 Insurance/Medicaid/sponsorship through [facility]  °[facility] [address]                                    [place], [place] (336) 342-8316 Therapy/tele-psych/case  °[facility] [address]  ° [place], [place] (336) 349-2233    °Dr. Arfeen  (336) 349-4544   °[facility] [place]  [employer] [place] [facility] 1) [address],  °2) [address], [place] °3)  [address], [place] (336) 349-3220 °(336) 342-7768 ° °(336) 342-8140    °[place] [facility] (336) 342-1394 or (336) 342-3537 (After Hours)    ° ° ° °

## 2013-05-18 NOTE — ED Notes (Signed)
Patient transported to CT 

## 2013-06-25 ENCOUNTER — Encounter (HOSPITAL_COMMUNITY): Payer: Self-pay | Admitting: Emergency Medicine

## 2013-06-25 ENCOUNTER — Emergency Department (HOSPITAL_COMMUNITY)
Admission: EM | Admit: 2013-06-25 | Discharge: 2013-06-26 | Disposition: A | Discharge status: Home / Self Care | Payer: 59 | Attending: Emergency Medicine | Admitting: Emergency Medicine

## 2013-06-25 DIAGNOSIS — R0789 Other chest pain: Secondary | ICD-10-CM | POA: Insufficient documentation

## 2013-06-25 DIAGNOSIS — R079 Chest pain, unspecified: Secondary | ICD-10-CM | POA: Diagnosis present

## 2013-06-25 DIAGNOSIS — Z8659 Personal history of other mental and behavioral disorders: Secondary | ICD-10-CM | POA: Diagnosis not present

## 2013-06-25 DIAGNOSIS — J45901 Unspecified asthma with (acute) exacerbation: Secondary | ICD-10-CM | POA: Diagnosis not present

## 2013-06-25 DIAGNOSIS — Z8751 Personal history of pre-term labor: Secondary | ICD-10-CM | POA: Insufficient documentation

## 2013-06-25 DIAGNOSIS — Z79899 Other long term (current) drug therapy: Secondary | ICD-10-CM | POA: Diagnosis not present

## 2013-06-25 DIAGNOSIS — Z86711 Personal history of pulmonary embolism: Secondary | ICD-10-CM | POA: Insufficient documentation

## 2013-06-25 DIAGNOSIS — X58XXXA Exposure to other specified factors, initial encounter: Secondary | ICD-10-CM | POA: Insufficient documentation

## 2013-06-25 DIAGNOSIS — Y939 Activity, unspecified: Secondary | ICD-10-CM | POA: Diagnosis not present

## 2013-06-25 DIAGNOSIS — Z7982 Long term (current) use of aspirin: Secondary | ICD-10-CM | POA: Diagnosis not present

## 2013-06-25 DIAGNOSIS — M549 Dorsalgia, unspecified: Secondary | ICD-10-CM | POA: Insufficient documentation

## 2013-06-25 DIAGNOSIS — M79602 Pain in left arm: Secondary | ICD-10-CM

## 2013-06-25 DIAGNOSIS — M79609 Pain in unspecified limb: Secondary | ICD-10-CM | POA: Diagnosis not present

## 2013-06-25 DIAGNOSIS — Y929 Unspecified place or not applicable: Secondary | ICD-10-CM | POA: Diagnosis not present

## 2013-06-25 DIAGNOSIS — S139XXA Sprain of joints and ligaments of unspecified parts of neck, initial encounter: Secondary | ICD-10-CM | POA: Insufficient documentation

## 2013-06-25 DIAGNOSIS — S161XXA Strain of muscle, fascia and tendon at neck level, initial encounter: Secondary | ICD-10-CM

## 2013-06-25 NOTE — ED Notes (Signed)
Pt states she is having pain in her left back, chest, and arm that started two days ago and has progressively gotten worse   Pt states it hurts to breathe and that any sudden movement makes the pain worse

## 2013-06-26 ENCOUNTER — Ambulatory Visit (HOSPITAL_COMMUNITY): Payer: 59

## 2013-06-26 ENCOUNTER — Emergency Department (HOSPITAL_COMMUNITY): Payer: 59

## 2013-06-26 ENCOUNTER — Ambulatory Visit (HOSPITAL_COMMUNITY): Payer: 59 | Attending: Emergency Medicine

## 2013-06-26 DIAGNOSIS — R0789 Other chest pain: Secondary | ICD-10-CM | POA: Diagnosis not present

## 2013-06-26 LAB — CBC WITH DIFFERENTIAL/PLATELET
BASOS ABS: 0 10*3/uL (ref 0.0–0.1)
BASOS PCT: 0 % (ref 0–1)
Eosinophils Absolute: 0.2 10*3/uL (ref 0.0–0.7)
Eosinophils Relative: 3 % (ref 0–5)
HCT: 31.8 % — ABNORMAL LOW (ref 36.0–46.0)
Hemoglobin: 10 g/dL — ABNORMAL LOW (ref 12.0–15.0)
Lymphocytes Relative: 49 % — ABNORMAL HIGH (ref 12–46)
Lymphs Abs: 4.2 10*3/uL — ABNORMAL HIGH (ref 0.7–4.0)
MCH: 26.2 pg (ref 26.0–34.0)
MCHC: 31.4 g/dL (ref 30.0–36.0)
MCV: 83.2 fL (ref 78.0–100.0)
Monocytes Absolute: 0.7 10*3/uL (ref 0.1–1.0)
Monocytes Relative: 8 % (ref 3–12)
NEUTROS ABS: 3.5 10*3/uL (ref 1.7–7.7)
NEUTROS PCT: 40 % — AB (ref 43–77)
PLATELETS: 400 10*3/uL (ref 150–400)
RBC: 3.82 MIL/uL — ABNORMAL LOW (ref 3.87–5.11)
RDW: 15.9 % — AB (ref 11.5–15.5)
WBC: 8.7 10*3/uL (ref 4.0–10.5)

## 2013-06-26 LAB — BASIC METABOLIC PANEL
BUN: 15 mg/dL (ref 6–23)
CALCIUM: 9.2 mg/dL (ref 8.4–10.5)
CHLORIDE: 100 meq/L (ref 96–112)
CO2: 21 mEq/L (ref 19–32)
Creatinine, Ser: 0.6 mg/dL (ref 0.50–1.10)
GFR calc non Af Amer: >90 mL/min (ref >90)
Glucose, Bld: 75 mg/dL (ref 70–99)
Potassium: 3.9 mEq/L (ref 3.7–5.3)
SODIUM: 136 meq/L — AB (ref 137–147)

## 2013-06-26 LAB — TROPONIN I

## 2013-06-26 LAB — D-DIMER, QUANTITATIVE (NOT AT ARMC): D DIMER QUANT: 0.42 ug{FEU}/mL (ref 0.00–0.48)

## 2013-06-26 MED ORDER — ALBUTEROL SULFATE HFA 108 (90 BASE) MCG/ACT IN AERS: 1.0000 | INHALATION_SPRAY | Freq: Four times a day (QID) | RESPIRATORY_TRACT | Status: DC | PRN

## 2013-06-26 MED ORDER — KETOROLAC TROMETHAMINE 30 MG/ML IJ SOLN
30.0000 mg | Freq: Once | INTRAMUSCULAR | Status: AC
  Administered 2013-06-26: 30 mg via INTRAVENOUS
  Filled 2013-06-26: qty 1

## 2013-06-26 MED ORDER — CYCLOBENZAPRINE HCL 10 MG PO TABS: 10.0000 mg | ORAL_TABLET | Freq: Three times a day (TID) | ORAL | Status: DC | PRN

## 2013-06-26 MED ORDER — SODIUM CHLORIDE 0.9 % IV BOLUS (SEPSIS)
500.0000 mL | Freq: Once | INTRAVENOUS | Status: AC
  Administered 2013-06-26: 500 mL via INTRAVENOUS

## 2013-06-26 MED ORDER — ENOXAPARIN SODIUM 100 MG/ML ~~LOC~~ SOLN
100.0000 mg | Freq: Once | SUBCUTANEOUS | Status: AC
  Administered 2013-06-26: 100 mg via SUBCUTANEOUS
  Filled 2013-06-26: qty 1

## 2013-06-26 NOTE — ED Provider Notes (Signed)
Medical screening examination/treatment/procedure(s) were performed by non-physician practitioner and as supervising physician I was immediately available for consultation/collaboration.   EKG Interpretation   Date/Time:  Wednesday June 25 2013 22:23:00 EDT Ventricular Rate:  70 PR Interval:  123 QRS Duration: 83 QT Interval:  382 QTC Calculation: 412 R Axis:   55 Text Interpretation:  Normal sinus rhythm Normal ECG No significant change  since last tracing Confirmed by GOLDSTON  MD, SCOTT ([id number]) on 06/25/2013  11:57:50 PM       Ethelda ChickMartha K Linker, MD 06/26/13 [phone]

## 2013-06-26 NOTE — Discharge Instructions (Signed)
You were evaluated for urinary chest pains, neck pain and arm pain. Your lab testing today has not shown any signs for an emergent condition. You have been scheduled for Doppler testing tomorrow. Please followup for these tests. Please also follow up with your primary care provider for continued evaluation and treatment.    Chest Pain (Nonspecific) It is often hard to give a specific diagnosis for the cause of chest pain. There is always a chance that your pain could be related to something serious, such as a heart attack or a blood clot in the lungs. You need to follow up with your health care provider for further evaluation. CAUSES   Heartburn.  Pneumonia or bronchitis.  Anxiety or stress.  Inflammation around your heart (pericarditis) or lung (pleuritis or pleurisy).  A blood clot in the lung.  A collapsed lung (pneumothorax). It can develop suddenly on its own (spontaneous pneumothorax) or from trauma to the chest.  Shingles infection (herpes zoster virus). The chest wall is composed of bones, muscles, and cartilage. Any of these can be the source of the pain.  The bones can be bruised by injury.  The muscles or cartilage can be strained by coughing or overwork.  The cartilage can be affected by inflammation and become sore (costochondritis). DIAGNOSIS  Lab tests or other studies may be needed to find the cause of your pain. Your health care provider may have you take a test called an ambulatory electrocardiogram (ECG). An ECG records your heartbeat patterns over a 24-hour period. You may also have other tests, such as:  Transthoracic echocardiogram (TTE). During echocardiography, sound waves are used to evaluate how blood flows through your heart.  Transesophageal echocardiogram (TEE).  Cardiac monitoring. This allows your health care provider to monitor your heart rate and rhythm in real time.  Holter monitor. This is a portable device that records your heartbeat and can  help diagnose heart arrhythmias. It allows your health care provider to track your heart activity for several days, if needed.  Stress tests by exercise or by giving medicine that makes the heart beat faster. TREATMENT   Treatment depends on what may be causing your chest pain. Treatment may include:  Acid blockers for heartburn.  Anti-inflammatory medicine.  Pain medicine for inflammatory conditions.  Antibiotics if an infection is present.  You may be advised to change lifestyle habits. This includes stopping smoking and avoiding alcohol, caffeine, and chocolate.  You may be advised to keep your head raised (elevated) when sleeping. This reduces the chance of acid going backward from your stomach into your esophagus. Most of the time, nonspecific chest pain will improve within 2-3 days with rest and mild pain medicine.  HOME CARE INSTRUCTIONS   If antibiotics were prescribed, take them as directed. Finish them even if you start to feel better.  For the next few days, avoid physical activities that bring on chest pain. Continue physical activities as directed.  Do not use any tobacco products, including cigarettes, chewing tobacco, or electronic cigarettes.  Avoid drinking alcohol.  Only take medicine as directed by your health care provider.  Follow your health care provider's suggestions for further testing if your chest pain does not go away.  Keep any follow-up appointments you made. If you do not go to an appointment, you could develop lasting (chronic) problems with pain. If there is any problem keeping an appointment, call to reschedule. SEEK MEDICAL CARE IF:   Your chest pain does not go away, even after  treatment.  You have a rash with blisters on your chest.  You have a fever. SEEK IMMEDIATE MEDICAL CARE IF:   You have increased chest pain or pain that spreads to your arm, neck, jaw, back, or abdomen.  You have shortness of breath.  You have an increasing  cough, or you cough up blood.  You have severe back or abdominal pain.  You feel nauseous or vomit.  You have severe weakness.  You faint.  You have chills. This is an emergency. Do not wait to see if the pain will go away. Get medical help at once. Call your local emergency services (911 in [place] Do not drive yourself to the hospital. MAKE SURE YOU:   Understand these instructions.  Will watch your condition.  Will get help right away if you are not doing well or get worse. Document Released: 09/28/2004 Document Revised: 12/24/2012 Document Reviewed: 07/25/2007 [facility] Patient Information 2015 [place]Care, MarylandLLC. This information is not intended to replace advice given to you by your health care provider. Make sure you discuss any questions you have with your health care provider.    Muscle Strain A muscle strain (pulled muscle) happens when a muscle is stretched beyond normal length. It happens when a sudden, violent force stretches your muscle too far. Usually, a few of the fibers in your muscle are torn. Muscle strain is common in athletes. Recovery usually takes 1-2 weeks. Complete healing takes 5-6 weeks.  HOME CARE   Follow the PRICE method of treatment to help your injury get better. Do this the first 2-3 days after the injury:  Protect. Protect the muscle to keep it from getting injured again.  Rest. Limit your activity and rest the injured body part.  Ice. Put ice in a plastic bag. Place a towel between your skin and the bag. Then, apply the ice and leave it on from 15-20 minutes each hour. After the third day, switch to moist heat packs.  Compression. Use a splint or elastic bandage on the injured area for comfort. Do not put it on too tightly.  Elevate. Keep the injured body part above the level of your heart.  Only take medicine as told by your doctor.  Warm up before doing exercise to prevent future muscle strains. GET HELP IF:   You have more pain or  puffiness (swelling) in the injured area.  You feel numbness, tingling, or notice a loss of strength in the injured area. MAKE SURE YOU:   Understand these instructions.  Will watch your condition.  Will get help right away if you are not doing well or get worse. Document Released: 09/28/2007 Document Revised: 10/09/2012 Document Reviewed: 07/18/2012 [facility] Patient Information 2015 [place]Care, [place]LLC. This information is not intended to replace advice given to you by your health care provider. Make sure you discuss any questions you have with your health care provider.

## 2013-06-26 NOTE — ED Provider Notes (Signed)
Medical screening examination/treatment/procedure(s) were performed by non-physician practitioner and as supervising physician I was immediately available for consultation/collaboration.   EKG Interpretation   Date/Time:  Wednesday June 25 2013 22:23:00 EDT Ventricular Rate:  70 PR Interval:  123 QRS Duration: 83 QT Interval:  382 QTC Calculation: 412 R Axis:   55 Text Interpretation:  Normal sinus rhythm Normal ECG No significant change  since last tracing Confirmed by GOLDSTON  MD, SCOTT ([id number]) on 06/25/2013  11:57:50 PM       Ethelda ChickMartha K Linker, MD 06/26/13 [phone]

## 2013-06-26 NOTE — ED Provider Notes (Signed)
CSN: [id number]     Arrival date & time 06/25/13  2203 History   First MD Initiated Contact with Patient 06/25/13 2346     Chief Complaint  Patient presents with  . Chest Pain   HPI Comments: Patient has a history of PE during pregnancy in November 2013.  She was treated with an anticoagulant and was taken off of it 6 months ago.  Patient gives a chronic history of chest pain since then.  Patient was scanned here in the ED recently on 05/18/13 with no evidence of a PE at that time.    Patient is a 28 y.o. female presenting with chest pain. The history is provided by the patient. No language interpreter was used.  Chest Pain Pain location:  L chest and L lateral chest (radiates to left arm) Pain quality: sharp, shooting and stabbing   Pain radiates to:  L arm Pain radiates to the back: yes   Pain severity:  Severe Onset quality:  Gradual Duration:  3 days Timing:  Constant Progression:  Worsening Chronicity:  Chronic Context: breathing, lifting, movement and raising an arm   Context: no drug use, not eating, no intercourse, not at rest, no stress and no trauma   Relieved by:  Nothing Worsened by:  Exertion, movement and deep breathing Ineffective treatments:  Aspirin Associated symptoms: back pain and shortness of breath   Associated symptoms: no abdominal pain, no altered mental status, no anorexia, no anxiety, no claudication, no cough, no diaphoresis, no dizziness, no dysphagia, no fatigue, no fever, no headache, no heartburn, no lower extremity edema, no nausea, no near-syncope, no numbness, no orthopnea, no palpitations, no PND, no syncope, not vomiting and no weakness   Shortness of breath:    Severity:  Moderate   Onset quality:  Gradual   Duration:  3 days   Timing:  Constant   Progression:  Worsening Risk factors: prior DVT/PE   Risk factors: no birth control, no diabetes mellitus, no hypertension, not obese, not pregnant, no smoking and no surgery     Past Medical History   Diagnosis Date  . Asthma   . Preeclampsia   . Hyperthyroidism   . Anxiety   . PE (pulmonary embolism) 11/2011  . Preeclampsia   . Pregnancy induced hypertension   . Preterm labor   . Pulmonary embolism Nov 2013   Past Surgical History  Procedure Laterality Date  . Cesarean section     Family History  Problem Relation Age of Onset  . Hypothyroidism Maternal Aunt   . Hypertension Maternal Grandfather   . Hypertension Paternal Grandmother   . Lupus Other    History  Substance Use Topics  . Smoking status: Never Smoker   . Smokeless tobacco: Never Used  . Alcohol Use: No     Comment: occasional   OB History   Grav Para Term Preterm Abortions TAB SAB Ect Mult Living   2 1  1      1      Review of Systems  Constitutional: Negative for fever, chills, diaphoresis and fatigue.  HENT: Negative for trouble swallowing.   Respiratory: Positive for shortness of breath. Negative for cough.   Cardiovascular: Positive for chest pain. Negative for palpitations, orthopnea, claudication, syncope, PND and near-syncope.  Gastrointestinal: Negative for heartburn, nausea, vomiting, abdominal pain and anorexia.  Musculoskeletal: Positive for back pain.  Neurological: Negative for dizziness, weakness, numbness and headaches.  All other systems reviewed and are negative.     Allergies  Banana and Cefixime  Home Medications   Prior to Admission medications   Medication Sig Start Date End Date Taking? Authorizing Provider  albuterol (PROVENTIL HFA;VENTOLIN HFA) 108 (90 BASE) MCG/ACT inhaler Inhale 1 puff into the lungs every 6 (six) hours as needed for wheezing or shortness of breath.   Yes Historical Provider, MD  aspirin 325 MG tablet Take 650 mg by mouth daily.    Yes Historical Provider, MD  ibuprofen (ADVIL,MOTRIN) 200 MG tablet Take 600 mg by mouth every 6 (six) hours as needed for moderate pain.   Yes Historical Provider, MD   BP 114/75  Pulse 82  Temp(Src) 98.6 F (37 C)  (Oral)  Resp 18  SpO2 100%  LMP 05/02/2013 Physical Exam  Nursing note and vitals reviewed. Constitutional: She is oriented to person, place, and time. She appears well-developed and well-nourished. No distress.  HENT:  Head: Normocephalic and atraumatic.  Mouth/Throat: Oropharynx is clear and moist. No oropharyngeal exudate.  Eyes: Conjunctivae and EOM are normal. Pupils are equal, round, and reactive to light. No scleral icterus.  Neck: Normal range of motion. Neck supple. No thyromegaly present.  Cardiovascular: Normal rate, regular rhythm, normal heart sounds and intact distal pulses.  Exam reveals no gallop and no friction rub.   No murmur heard. Pulmonary/Chest: Effort normal and breath sounds normal. No respiratory distress. She has no wheezes. She has no rales. She exhibits no tenderness.  Abdominal: Soft. Bowel sounds are normal. She exhibits no distension and no mass. There is no tenderness. There is no rebound and no guarding.  Musculoskeletal: She exhibits tenderness.  Patient is sitting with the neck in right lateral bending.  There is no visible swelling or erythema.  There is no bony tenderness to palpation.  Left trapezius is in spasm.  Patient can demonstrate active neck flexion, extension, rotation in both direction, but has pain with left lateral bending.  Patient has sensation intact to all extremities.  There is equal motor strength in all extremities.  Patient is aprehensive to move right shoulder as it increases her pain.  Patient has negative hoffmans sign at this time.    Lymphadenopathy:    She has no cervical adenopathy.  Neurological: She is alert and oriented to person, place, and time. No cranial nerve deficit. Coordination normal.  Skin: Skin is warm and dry. She is not diaphoretic.  Psychiatric: She has a normal mood and affect. Her behavior is normal. Judgment and thought content normal.    ED Course  Procedures (including critical care time) Labs  Review Labs Reviewed  CBC WITH DIFFERENTIAL - Abnormal; Notable for the following:    RBC 3.82 (*)    Hemoglobin 10.0 (*)    HCT 31.8 (*)    RDW 15.9 (*)    Neutrophils Relative % 40 (*)    Lymphocytes Relative 49 (*)    Lymphs Abs 4.2 (*)    All other components within normal limits  BASIC METABOLIC PANEL - Abnormal; Notable for the following:    Sodium 136 (*)    All other components within normal limits  D-DIMER, QUANTITATIVE  TROPONIN I    Imaging Review Dg Chest 2 View  06/26/2013   CLINICAL DATA:  Left upper back pain radiating to the chest, 48 hr. Shortness of breath.  EXAM: CHEST  2 VIEW  COMPARISON:  05/18/2013  FINDINGS: The heart size and mediastinal contours are within normal limits. Both lungs are clear. The visualized skeletal structures are unremarkable.  IMPRESSION: No  active cardiopulmonary disease.   Electronically Signed   By: Herbie Baltimore M.D.   On: 06/26/2013 01:00     EKG Interpretation   Date/Time:  Wednesday June 25 2013 22:23:00 EDT Ventricular Rate:  70 PR Interval:  123 QRS Duration: 83 QT Interval:  382 QTC Calculation: 412 R Axis:   55 Text Interpretation:  Normal sinus rhythm Normal ECG No significant change  since last tracing Confirmed by GOLDSTON  MD, SCOTT ([id number]) on 06/25/2013  11:57:50 PM      MDM  1.  Chest pain 2.  Neck pain  Patient presents to the ED with 2 days of chest pain here in the ED.  Patient gives history of chronic chest pain, but does have a history of a PE 2 years ago.  Have ordered a basic cardiac workup at this time.  CXR is negative at this time.  Troponin is pending.  Patient is anemic but appears to be anemic at baseline.  D-dimer is negative at this time.  Will schedule the patient for outpatient dopplers at this time and will give a dose of lovenox here in the ED. Patient has been treated here with ketorolac for neck pain with little relief.  Patient may benefit from two days of norco, muscle relaxers upon  discharge.  I will sign the patient out with Ivonne Andrew PA-C at this time.    Clydie Braun, PA-C 06/26/13 [phone]

## 2013-06-26 NOTE — ED Provider Notes (Signed)
Horn,Debra S 2:00 AM patient discussed in sign out. Patient with atypical chest pains however does have a history of PE. Patient did have a recent CT angiogram 1 month ago that was negative for PE. Today d-dimer is unremarkable. No concerning or acute findings on EKG. Troponin is pending. If troponin negative patient may be discharged after given a dose of Lovenox. She'll be scheduled for outpatient lower extremity Doppler ultrasound to rule out DVT. Patient has also had some complaints of neck strain and soreness. We'll plan to give her prescription for muscle relaxer.   2:40 AM troponin is normal. At this time patient may be discharged with current plan for Lovenox outpatient Doppler and muscle relaxer for neck pains.  Angus SellerPeter S Dammen, PA-C 06/26/13 [phone]

## 2013-08-11 ENCOUNTER — Emergency Department (HOSPITAL_COMMUNITY)
Admission: EM | Admit: 2013-08-11 | Discharge: 2013-08-12 | Disposition: A | Discharge status: Home / Self Care | Payer: 59 | Attending: Emergency Medicine | Admitting: Emergency Medicine

## 2013-08-11 ENCOUNTER — Encounter (HOSPITAL_COMMUNITY): Payer: Self-pay | Admitting: Emergency Medicine

## 2013-08-11 DIAGNOSIS — Z8659 Personal history of other mental and behavioral disorders: Secondary | ICD-10-CM | POA: Insufficient documentation

## 2013-08-11 DIAGNOSIS — Z7982 Long term (current) use of aspirin: Secondary | ICD-10-CM | POA: Insufficient documentation

## 2013-08-11 DIAGNOSIS — Z8639 Personal history of other endocrine, nutritional and metabolic disease: Secondary | ICD-10-CM | POA: Insufficient documentation

## 2013-08-11 DIAGNOSIS — Z8742 Personal history of other diseases of the female genital tract: Secondary | ICD-10-CM | POA: Insufficient documentation

## 2013-08-11 DIAGNOSIS — Z79899 Other long term (current) drug therapy: Secondary | ICD-10-CM | POA: Insufficient documentation

## 2013-08-11 DIAGNOSIS — Z862 Personal history of diseases of the blood and blood-forming organs and certain disorders involving the immune mechanism: Secondary | ICD-10-CM | POA: Insufficient documentation

## 2013-08-11 DIAGNOSIS — J45901 Unspecified asthma with (acute) exacerbation: Secondary | ICD-10-CM | POA: Insufficient documentation

## 2013-08-11 DIAGNOSIS — R0789 Other chest pain: Secondary | ICD-10-CM

## 2013-08-11 NOTE — ED Notes (Signed)
Pt complains of being short of breath and chest tightness since last night, hx of PE

## 2013-08-12 ENCOUNTER — Other Ambulatory Visit: Payer: Self-pay

## 2013-08-12 ENCOUNTER — Emergency Department (HOSPITAL_COMMUNITY): Payer: 59

## 2013-08-12 LAB — D-DIMER, QUANTITATIVE (NOT AT ARMC): D DIMER QUANT: 0.31 ug{FEU}/mL (ref 0.00–0.48)

## 2013-08-12 LAB — COMPREHENSIVE METABOLIC PANEL
ALBUMIN: 3.2 g/dL — AB (ref 3.5–5.2)
ALT: 18 U/L (ref 0–35)
AST: 20 U/L (ref 0–37)
Alkaline Phosphatase: 101 U/L (ref 39–117)
Anion gap: 11 (ref 5–15)
BUN: 18 mg/dL (ref 6–23)
CALCIUM: 9.6 mg/dL (ref 8.4–10.5)
CO2: 26 meq/L (ref 19–32)
CREATININE: 0.84 mg/dL (ref 0.50–1.10)
Chloride: 102 mEq/L (ref 96–112)
GFR calc Af Amer: >90 mL/min (ref >90)
Glucose, Bld: 106 mg/dL — ABNORMAL HIGH (ref 70–99)
Potassium: 4.2 mEq/L (ref 3.7–5.3)
SODIUM: 139 meq/L (ref 137–147)
Total Bilirubin: <0.2 mg/dL — ABNORMAL LOW (ref 0.3–1.2)
Total Protein: 7.5 g/dL (ref 6.0–8.3)

## 2013-08-12 LAB — PROTIME-INR
INR: 1.09 (ref 0.00–1.49)
PROTHROMBIN TIME: 14.1 s (ref 11.6–15.2)

## 2013-08-12 LAB — CBC
HCT: 30.4 % — ABNORMAL LOW (ref 36.0–46.0)
Hemoglobin: 9.7 g/dL — ABNORMAL LOW (ref 12.0–15.0)
MCH: 26.1 pg (ref 26.0–34.0)
MCHC: 31.9 g/dL (ref 30.0–36.0)
MCV: 81.9 fL (ref 78.0–100.0)
PLATELETS: 423 10*3/uL — AB (ref 150–400)
RBC: 3.71 MIL/uL — AB (ref 3.87–5.11)
RDW: 15.5 % (ref 11.5–15.5)
WBC: 9.4 10*3/uL (ref 4.0–10.5)

## 2013-08-12 LAB — APTT: aPTT: 26 seconds (ref 24–37)

## 2013-08-12 MED ORDER — SODIUM CHLORIDE 0.9 % IV SOLN
1000.0000 mL | INTRAVENOUS | Status: DC
  Administered 2013-08-12: 1000 mL via INTRAVENOUS

## 2013-08-12 MED ORDER — OMEPRAZOLE 20 MG PO CPDR: 20.0000 mg | DELAYED_RELEASE_CAPSULE | Freq: Every day | ORAL | Status: DC

## 2013-08-12 MED ORDER — ASPIRIN 81 MG PO CHEW
324.0000 mg | CHEWABLE_TABLET | Freq: Once | ORAL | Status: DC
  Filled 2013-08-12: qty 4

## 2013-08-12 NOTE — ED Provider Notes (Signed)
CSN: [id number]     Arrival date & time 08/11/13  2340 History   First MD Initiated Contact with Patient 08/12/13 0016     Chief Complaint  Patient presents with  . Shortness of Breath    Patient is a 28 y.o. female presenting with shortness of breath. The history is provided by the patient.  Shortness of Breath Severity:  Moderate Onset quality:  Gradual Duration:  1 day Timing:  Constant Chronicity:  Recurrent Relieved by:  Nothing Ineffective treatments:  None tried Associated symptoms: chest pain   Associated symptoms: no abdominal pain, no cough, no fever, no sore throat and no wheezing   Chest pain:    Quality:  Pressure (substernal in location, tightness)   Severity:  Moderate   Duration:  1 day   Progression: increases with breathing. Risk factors: hx of PE/DVT   Risk factors: no oral contraceptive use, no prolonged immobilization and no recent surgery     Past Medical History  Diagnosis Date  . Asthma   . Preeclampsia   . Hyperthyroidism   . Anxiety   . PE (pulmonary embolism) 11/2011  . Preeclampsia   . Pregnancy induced hypertension   . Preterm labor   . Pulmonary embolism Nov 2013   Past Surgical History  Procedure Laterality Date  . Cesarean section     Family History  Problem Relation Age of Onset  . Hypothyroidism Maternal Aunt   . Hypertension Maternal Grandfather   . Hypertension Paternal Grandmother   . Lupus Other    History  Substance Use Topics  . Smoking status: Never Smoker   . Smokeless tobacco: Never Used  . Alcohol Use: No     Comment: occasional   OB History   Grav Para Term Preterm Abortions TAB SAB Ect Mult Living   2 1  1      1      Review of Systems  Constitutional: Negative for fever.  HENT: Negative for sore throat.   Respiratory: Positive for shortness of breath. Negative for cough and wheezing.   Cardiovascular: Positive for chest pain.  Gastrointestinal: Negative for abdominal pain.  All other systems reviewed and  are negative.     Allergies  Banana and Cefixime  Home Medications   Prior to Admission medications   Medication Sig Start Date End Date Taking? Authorizing Provider  albuterol (PROVENTIL HFA;VENTOLIN HFA) 108 (90 BASE) MCG/ACT inhaler Inhale 1 puff into the lungs every 6 (six) hours as needed for wheezing or shortness of breath.   Yes Historical Provider, MD  aspirin 325 MG tablet Take 325 mg by mouth daily.    Yes Historical Provider, MD  aspirin 325 MG tablet Take 975 mg by mouth once.    Historical Provider, MD  omeprazole (PRILOSEC) 20 MG capsule Take 1 capsule (20 mg total) by mouth daily. 08/12/13   Linwood DibblesJon Nyree Applegate, MD   BP 127/68  Pulse 70  Temp(Src) 98.5 F (36.9 C) (Oral)  Resp 20  Ht 5\' 3"  (1.6 m)  Wt 200 lb (90.719 kg)  BMI 35.44 kg/m2  SpO2 100%  LMP 08/04/2013 Physical Exam  Nursing note and vitals reviewed. Constitutional: She appears well-developed and well-nourished. No distress.  HENT:  Head: Normocephalic and atraumatic.  Right Ear: External ear normal.  Left Ear: External ear normal.  Eyes: Conjunctivae are normal. Right eye exhibits no discharge. Left eye exhibits no discharge. No scleral icterus.  Neck: Neck supple. No tracheal deviation present.  Cardiovascular: Normal rate, regular rhythm  and intact distal pulses.   Pulmonary/Chest: Effort normal and breath sounds normal. No stridor. No respiratory distress. She has no wheezes. She has no rales.  Abdominal: Soft. Bowel sounds are normal. She exhibits no distension. There is no tenderness. There is no rebound and no guarding.  Musculoskeletal: She exhibits no edema and no tenderness.  Neurological: She is alert. She has normal strength. No cranial nerve deficit (no facial droop, extraocular movements intact, no slurred speech) or sensory deficit. She exhibits normal muscle tone. She displays no seizure activity. Coordination normal.  Skin: Skin is warm and dry. No rash noted.  Psychiatric: She has a normal  mood and affect.    ED Course  Procedures (including critical care time) Labs Review Labs Reviewed  CBC - Abnormal; Notable for the following:    RBC 3.71 (*)    Hemoglobin 9.7 (*)    HCT 30.4 (*)    Platelets 423 (*)    All other components within normal limits  COMPREHENSIVE METABOLIC PANEL - Abnormal; Notable for the following:    Glucose, Bld 106 (*)    Albumin 3.2 (*)    Total Bilirubin <0.2 (*)    All other components within normal limits  APTT  PROTIME-INR  D-DIMER, QUANTITATIVE    Imaging Review Dg Chest 2 View  08/12/2013   CLINICAL DATA:  Shortness of breath.  History of asthma.  EXAM: CHEST  2 VIEW  COMPARISON:  Chest radiograph performed 06/26/2013  FINDINGS: The lungs are well-aerated and clear. There is no evidence of focal opacification, pleural effusion or pneumothorax.  The heart is normal in size; the mediastinal contour is within normal limits. No acute osseous abnormalities are seen.  IMPRESSION: No acute cardiopulmonary process seen.   Electronically Signed   By: Roanna Raider M.D.   On: 08/12/2013 01:13    EKG 08/12/2013 00:11:22  Rate: 87  Rhythm: normal sinus rhythm  QRS Axis: normal  Intervals: normal  ST/T Wave abnormalities: normal  Conduction Disutrbances:none  Narrative Interpretation: normal EKG  Old EKG Reviewed: none available   MDM   Final diagnoses:  Chest tightness or pressure   Doubt cardiac etiology.  Doubt PE.  History of similar symptoms in the past.  Could be related to GERD.  Dc home with trial of PPI.  Follow up with PCP    Linwood Dibbles, MD 08/12/13 0221

## 2013-09-15 ENCOUNTER — Encounter (HOSPITAL_COMMUNITY): Payer: Self-pay | Admitting: Emergency Medicine

## 2013-09-15 ENCOUNTER — Emergency Department (HOSPITAL_COMMUNITY): Payer: Medicaid Other

## 2013-09-15 ENCOUNTER — Emergency Department (HOSPITAL_COMMUNITY)
Admission: EM | Admit: 2013-09-15 | Discharge: 2013-09-15 | Disposition: A | Discharge status: Home / Self Care | Payer: Medicaid Other | Attending: Emergency Medicine | Admitting: Emergency Medicine

## 2013-09-15 DIAGNOSIS — Z862 Personal history of diseases of the blood and blood-forming organs and certain disorders involving the immune mechanism: Secondary | ICD-10-CM | POA: Diagnosis not present

## 2013-09-15 DIAGNOSIS — J45909 Unspecified asthma, uncomplicated: Secondary | ICD-10-CM | POA: Insufficient documentation

## 2013-09-15 DIAGNOSIS — Z8701 Personal history of pneumonia (recurrent): Secondary | ICD-10-CM | POA: Diagnosis not present

## 2013-09-15 DIAGNOSIS — Z86711 Personal history of pulmonary embolism: Secondary | ICD-10-CM | POA: Insufficient documentation

## 2013-09-15 DIAGNOSIS — Z7982 Long term (current) use of aspirin: Secondary | ICD-10-CM | POA: Diagnosis not present

## 2013-09-15 DIAGNOSIS — R079 Chest pain, unspecified: Secondary | ICD-10-CM | POA: Insufficient documentation

## 2013-09-15 DIAGNOSIS — Z8742 Personal history of other diseases of the female genital tract: Secondary | ICD-10-CM | POA: Diagnosis not present

## 2013-09-15 DIAGNOSIS — Z79899 Other long term (current) drug therapy: Secondary | ICD-10-CM | POA: Diagnosis not present

## 2013-09-15 DIAGNOSIS — Z8659 Personal history of other mental and behavioral disorders: Secondary | ICD-10-CM | POA: Insufficient documentation

## 2013-09-15 DIAGNOSIS — Z8639 Personal history of other endocrine, nutritional and metabolic disease: Secondary | ICD-10-CM | POA: Insufficient documentation

## 2013-09-15 LAB — CBC
HCT: 32.9 % — ABNORMAL LOW (ref 36.0–46.0)
Hemoglobin: 10.3 g/dL — ABNORMAL LOW (ref 12.0–15.0)
MCH: 25.6 pg — AB (ref 26.0–34.0)
MCHC: 31.3 g/dL (ref 30.0–36.0)
MCV: 81.8 fL (ref 78.0–100.0)
PLATELETS: 432 10*3/uL — AB (ref 150–400)
RBC: 4.02 MIL/uL (ref 3.87–5.11)
RDW: 15.6 % — AB (ref 11.5–15.5)
WBC: 8.7 10*3/uL (ref 4.0–10.5)

## 2013-09-15 LAB — BASIC METABOLIC PANEL
ANION GAP: 14 (ref 5–15)
BUN: 21 mg/dL (ref 6–23)
CALCIUM: 9.9 mg/dL (ref 8.4–10.5)
CO2: 23 mEq/L (ref 19–32)
Chloride: 102 mEq/L (ref 96–112)
Creatinine, Ser: 0.7 mg/dL (ref 0.50–1.10)
Glucose, Bld: 92 mg/dL (ref 70–99)
Potassium: 4.1 mEq/L (ref 3.7–5.3)
SODIUM: 139 meq/L (ref 137–147)

## 2013-09-15 LAB — I-STAT TROPONIN, ED: Troponin i, poc: 0 ng/mL (ref 0.00–0.08)

## 2013-09-15 MED ORDER — IBUPROFEN 800 MG PO TABS
800.0000 mg | ORAL_TABLET | Freq: Once | ORAL | Status: AC
  Administered 2013-09-15: 800 mg via ORAL
  Filled 2013-09-15: qty 1

## 2013-09-15 MED ORDER — ALBUTEROL SULFATE HFA 108 (90 BASE) MCG/ACT IN AERS: 2.0000 | INHALATION_SPRAY | RESPIRATORY_TRACT | Status: DC | PRN

## 2013-09-15 MED ORDER — ALUM & MAG HYDROXIDE-SIMETH 200-200-20 MG/5ML PO SUSP
30.0000 mL | Freq: Once | ORAL | Status: AC
  Administered 2013-09-15: 30 mL via ORAL
  Filled 2013-09-15: qty 30

## 2013-09-15 MED ORDER — RANITIDINE HCL 150 MG/10ML PO SYRP
150.0000 mg | ORAL_SOLUTION | Freq: Once | ORAL | Status: AC
  Administered 2013-09-15: 150 mg via ORAL
  Filled 2013-09-15: qty 10

## 2013-09-15 MED ORDER — ALBUTEROL SULFATE HFA 108 (90 BASE) MCG/ACT IN AERS
2.0000 | INHALATION_SPRAY | RESPIRATORY_TRACT | Status: DC | PRN
Start: 2013-09-15 — End: 2013-09-15

## 2013-09-15 NOTE — ED Provider Notes (Signed)
CSN: [id number]     Arrival date & time 09/15/13  0123 History   First MD Initiated Contact with Patient 09/15/13 0303     Chief Complaint  Patient presents with  . Chest Pain     (Consider location/radiation/quality/duration/timing/severity/associated sxs/prior Treatment) HPI Debra Horn is a 28 y.o. female with a past medical history of pulmonary embolism and anxiety coming in with chest pain and shortness of breath. Patient states this occurred 3 hours prior to arrival. As on the left side it is sharp. There is no exertional component, emesis, diaphoresis. Patient took 2 full dose aspirins and came to the ED for her care. He does have history of pulmonary embolism 2 years ago that was treated with a course of Coumadin. Patient denies a pleuritic component to this pain. States this is different than her prior PE. She has not had any lower extremity swelling or pain. She denies long distance hour, recent surgeries.  10 Systems reviewed and are negative for acute change except as noted in the HPI.     Past Medical History  Diagnosis Date  . Asthma   . Preeclampsia   . Hyperthyroidism   . Anxiety   . PE (pulmonary embolism) 11/2011  . Preeclampsia   . Pregnancy induced hypertension   . Preterm labor   . Pulmonary embolism Nov 2013   Past Surgical History  Procedure Laterality Date  . Cesarean section     Family History  Problem Relation Age of Onset  . Hypothyroidism Maternal Aunt   . Hypertension Maternal Grandfather   . Hypertension Paternal Grandmother   . Lupus Other    History  Substance Use Topics  . Smoking status: Never Smoker   . Smokeless tobacco: Never Used  . Alcohol Use: No     Comment: occasional   OB History   Grav Para Term Preterm Abortions TAB SAB Ect Mult Living   Review of Systems    Allergies  Banana and Cefixime  Home Medications   Prior to Admission medications   Medication Sig Start Date End Date Taking?  Authorizing Provider  albuterol (PROVENTIL HFA;VENTOLIN HFA) 108 (90 BASE) MCG/ACT inhaler Inhale 1 puff into the lungs every 6 (six) hours as needed for wheezing or shortness of breath.   Yes Historical Provider, MD  aspirin 325 MG tablet Take 650 mg by mouth daily.    Yes Historical Provider, MD  albuterol (PROVENTIL HFA;VENTOLIN HFA) 108 (90 BASE) MCG/ACT inhaler Inhale 2 puffs into the lungs every 4 (four) hours as needed for wheezing or shortness of breath. 09/15/13   Tomasita Crumble, MD   BP 122/75  Pulse 68  Temp(Src) 97.5 F (36.4 C) (Oral)  Resp 11  Ht  (1.6 m)  Wt 209 lb (94.802 kg)  BMI 37.03 kg/m2  SpO2 98%  LMP 09/12/2013 Physical Exam  Nursing note and vitals reviewed. Constitutional: She is oriented to person, place, and time. She appears well-developed and well-nourished. No distress.  HENT:  Head: Normocephalic and atraumatic.  Nose: Nose normal.  Mouth/Throat: Oropharynx is clear and moist. No oropharyngeal exudate.  Eyes: Conjunctivae and EOM are normal. Pupils are equal, round, and reactive to light. No scleral icterus.  Neck: Normal range of motion. Neck supple. No JVD present. No tracheal deviation present. No thyromegaly present.  Cardiovascular: Normal rate, regular rhythm and normal heart sounds.  Exam reveals no gallop and no friction rub.  No murmur heard. Pulmonary/Chest: Effort normal and breath sounds normal. No respiratory distress. She has no wheezes. She exhibits no tenderness.  Abdominal: Soft. Bowel sounds are normal. She exhibits no distension and no mass. There is no tenderness. There is no rebound and no guarding.  Musculoskeletal: Normal range of motion. She exhibits no edema and no tenderness.  Lymphadenopathy:    She has no cervical adenopathy.  Neurological: She is alert and oriented to person, place, and time.  Skin: Skin is warm and dry. No rash noted. No erythema. No pallor.    ED Course  Procedures (including critical care time) Labs  Review Labs Reviewed  CBC - Abnormal; Notable for the following:    Hemoglobin 10.3 (*)    HCT 32.9 (*)    MCH 25.6 (*)    RDW 15.6 (*)    Platelets 432 (*)    All other components within normal limits  BASIC METABOLIC PANEL  I-STAT TROPOININ, ED    Imaging Review Dg Chest 2 View  09/15/2013   CLINICAL DATA:  Sudden onset of chest and left arm pain. Shortness of breath.  EXAM: CHEST  2 VIEW  COMPARISON:  Chest radiograph performed 08/12/2013  FINDINGS: The lungs are well-aerated and clear. There is no evidence of focal opacification, pleural effusion or pneumothorax.  The heart is normal in size; the mediastinal contour is within normal limits. No acute osseous abnormalities are seen.  IMPRESSION: No acute cardiopulmonary process seen.   Electronically Signed   By: Roanna Raider M.D.   On: 09/15/2013 02:46     EKG Interpretation None      MDM   Final diagnoses:  Chest pain, unspecified chest pain type    Patient is in today out of concern for chest pain shortness of breath. When I walk in the room she is resting comfortably in no acute distress. She is nontoxic appearing. Patient has had 4 CT scans in the past year, all have been negative for pulmonary embolism. She states this is not consistent with her prior pulmonary embolism. I do not believe CT scan or d-dimer is worse at this time. Her chest pain is not pleuritic, she has no lower extremity symptoms. I have low concern for ACS as well. There is no exertional component, emesis, or diaphoresis associated with the pain. Patient states he does have a history of reflux, she was given Zantac and Maalox which relieved her pain. I assessed this patient again approximately one hour later, and again she was found sleeping in no acute distress. She is nontoxic appearing. Her vital signs remain within her normal limits. Patient is requesting an albuterol inhaler prescription. She was given strict return precautions and told to follow up with  a primary care physician within 3 days for continued care.    Tomasita Crumble, MD 09/15/13 [phone]

## 2013-09-15 NOTE — Discharge Instructions (Signed)
Chest Pain (Nonspecific) Debra Horn, you were seen today for chest pain.  It was relieved with reflux medication.  Your blood work and EKG were normal.  Follow up with a regular doctor within 3 days for continued evaluation.  If any symptoms persist or worsen, come back to the ED immediately.  Thank you. It is often hard to give a diagnosis for the cause of chest pain. There is always a chance that your pain could be related to something serious, such as a heart attack or a blood clot in the lungs. You need to follow up with your doctor. HOME CARE  If antibiotic medicine was given, take it as directed by your doctor. Finish the medicine even if you start to feel better.  For the next few days, avoid activities that bring on chest pain. Continue physical activities as told by your doctor.  Do not use any tobacco products. This includes cigarettes, chewing tobacco, and e-cigarettes.  Avoid drinking alcohol.  Only take medicine as told by your doctor.  Follow your doctor's suggestions for more testing if your chest pain does not go away.  Keep all doctor visits you made. GET HELP IF:  Your chest pain does not go away, even after treatment.  You have a rash with blisters on your chest.  You have a fever. GET HELP RIGHT AWAY IF:   You have more pain or pain that spreads to your arm, neck, jaw, back, or belly (abdomen).  You have shortness of breath.  You cough more than usual or cough up blood.  You have very bad back or belly pain.  You feel sick to your stomach (nauseous) or throw up (vomit).  You have very bad weakness.  You pass out (faint).  You have chills. This is an emergency. Do not wait to see if the problems will go away. Call your local emergency services (911 in U.S.). Do not drive yourself to the hospital. MAKE SURE YOU:   Understand these instructions.  Will watch your condition.  Will get help right away if you are not doing well or get worse. Document  Released: 06/07/2007 Document Revised: 12/24/2012 Document Reviewed: 06/07/2007 [facility] Patient Information 2015 [place], [place]. This information is not intended to replace advice given to you by your health care provider. Make sure you discuss any questions you have with your health care provider.   Emergency Department Resource Guide 1) Find a Doctor and Pay Out of Pocket Although you won't have to find out who is covered by your insurance plan, it is a good idea to ask around and get recommendations. You will then need to call the office and see if the doctor you have chosen will accept you as a new patient and what types of options they offer for patients who are self-pay. Some doctors offer discounts or will set up payment plans for their patients who do not have insurance, but you will need to ask so you aren't surprised when you get to your appointment.  2) Contact Your Local Health Department Not all health departments have doctors that can see patients for sick visits, but many do, so it is worth a call to see if yours does. If you don't know where your local health department is, you can check in your phone book. The CDC also has a tool to help you locate your state's health department, and many state websites also have listings of all of their local health departments.  3) Find a  Walk-in Clinic If your illness is not likely to be very severe or complicated, you may want to try a walk in clinic. These are popping up all over the country in pharmacies, drugstores, and shopping centers. They're usually staffed by nurse practitioners or physician assistants that have been trained to treat common illnesses and complaints. They're usually fairly quick and inexpensive. However, if you have serious medical issues or chronic medical problems, these are probably not your best option.  No Primary Care Doctor: - Call Health Connect at  [phone] - they can help you locate a primary care doctor that   accepts your insurance, provides certain services, etc. - Physician Referral Service- [phone]  Chronic Pain Problems: Organization         Address  Phone   Notes  [facility]  [phone] Patients need to be referred by their primary care doctor.   Medication Assistance: Organization         Address  Phone   Notes  [facility] Medication [facility] [address] [place] [place], [place] [address] [phone] --Must be a resident of [facility] -- Must have NO insurance coverage whatsoever (no Medicaid/ Medicare, etc.) -- The pt. MUST have a primary care doctor that directs their care regularly and follows them in the community   [facility]ist  [phone]   [employer]  [phone]    Agencies that provide inexpensive medical care: Organization         Address  Phone   Notes  [facility]  [phone]   [facility]    [phone]   [facility] [address] [place], [place] [address] [phone]   [facility] [address] [place], [place] [phone]   Planned Parenthood    [phone]   [facility]    [phone]   [facility]  [address], [place] Phone:  [phone], Fax:  [phone] Hours of Operation:  9 am - 6 pm, M-F.  Also accepts Medicaid/Medicare and self-pay.  [facility]  [address], [place] Phone: [phone], Fax: [phone]. Hours of Operation:  8:30 am - 5:30 pm, M-F.  Also accepts Medicaid and self-pay.  [facility] [address], [place] Phone: [phone]   [facility] [address] Natasha Bence [place], [place] [phone], Ext. [phone] Mondays & Thursdays: 7-9 AM.  First 15 patients are seen on a first come, first serve basis.    Medicaid-accepting [facility] Providers:  Organization          Address  Phone   Notes  [facility] [address], [place] [phone] Also accepts self-pay patients.  [facility] [address] Laurell Josephs [place], [place]  [phone]   [facility] [address], [place] [phone]   [facility] [address] [place] [phone]   Renaye Rakers [address], [place]   [phone] Only accepts [place] Access [place] patients after they have their name applied to their card.   Self-Pay (no insurance) in [facility]:  Organization         Address  Phone   Notes  Sickle Cell Patients, [facility] Internal Medicine [address] [place],  [place] [phone]   [facility] Urgent Care [place] [phone]   [facility] [place]  [place], [place], [place] [phone]   [facility]/Dr. Osei-Bonsu  [address] [place] or [address], [place] [phone] Phone number for both [place] and [place] locations is the same.  Urgent Medical and [facility] [address] [place] [phone]   [facility] [address], [place] or [address] [phone]   [facility] [address], [place] [phone], phone; [phone], fax Sees patients 1st and 3rd Saturday of every month.  Must not qualify for public or private insurance (i.e. Medicaid, Medicare, [place] Health Choice, Veterans' Benefits)  Household income should be no more than 200% of the poverty level The clinic cannot treat you if you are pregnant or think you are pregnant  Sexually transmitted diseases are not treated at the clinic.    Dental Care: Organization         Address  Phone  Notes  [facility] of [facility] [place] [phone] Accepts children up to age 49 who are enrolled in [place] or [place]; pregnant women with a Medicaid card; and children who have applied for Medicaid or [place] Health Choice, but were declined, whose parents can pay a reduced fee at time of service.  [facility] of [facility]  [address], [place] [phone] Accepts children up to age 68 who are enrolled in [place] or [place]; pregnant women with a Medicaid card; and children who have applied for Medicaid or [place] Health Choice, but were declined, whose parents can pay a reduced fee at time of service.  [facility]  [place] [phone] Patients are seen by appointment only. Walk-ins are not accepted. [place] will see patients 64 years of age and older. Monday - Tuesday (8am-5pm) Most Wednesdays (8:30-5pm) $30 per visit, cash only  [facility] Adult Dental Access PROGRAM  [address], [facility] [phone] Patients are seen by appointment only. Walk-ins are not accepted. [place] will see patients 14 years of age and older. One Wednesday Evening (Monthly: Volunteer Based).  $30 per visit, cash only  [facility]  [phone] for adults; Children under age 32, call Graduate Pediatric Dentistry at [phone]. Children aged 32-14, please call [phone] to request a pediatric application.  Dental services are provided in all areas of dental care including fillings, crowns and bridges, complete and partial dentures, implants, gum treatment, root canals, and extractions. Preventive care is also provided. Treatment is provided to both adults and children. Patients are selected via a lottery and there is often a waiting list.   [facility] [address], [place]  [phone] [email or web address]   [facility] [address] [place], [place]  [phone], Ext. [phone] Second and Fourth Thursday of each month, opens at 6:30 AM; Clinic ends at 9 AM.  Patients are seen on a first-come first-served basis, and a limited number are seen during each clinic.   [facility]  [address] Hillard Danker [place], [place] [phone]   Eligibility Requirements You must have lived in [place], [place], or [place]  counties for at least the last three months.   You cannot be eligible for state or federal sponsored [employer], including [employer], [place], or [employer].   You generally cannot be eligible for healthcare insurance through your employer.    How to apply: Eligibility screenings are held every Tuesday and Wednesday afternoon from 1:00 pm until 4:00 pm. You do not need an appointment for the interview!  [facility] [address], [place], [place]   [place]  Thor Department  [place]  [phone]    Behavioral Health Resources in the Community: Intensive Outpatient Programs Organization         Address  Phone  Notes  Bayard Woolsey. [address], [place], [place] [phone]   [facility] Outpatient [address] [place], [place]   ADS: Alcohol & Drug S[facility]cs [address], [place], [place]   [place] [address]  [place], [place] or [phone]   Substance Abuse Resources Organization         Address  Phone  Notes  Alcohol and Drug Services  [phone]   [place]  [phone]   [facility] [place]   Chinita Pester  [phone]   Residential & Outpatient Substance Abuse Program  [phone]   Psychological Services Organization         Address  Phone  Notes  [facility] [place]  [place]  [phone]    [place] [address], [place] or [phone]    Mobile Crisis Teams Organization         Address  Phone  Notes  [facility], Mobile Crisis Care Unit  [phone]   [facility]  [address] [place], [place]   [place] [address], [place] [phone]    Self-Help/Support Groups Organization         Address  Phone             Notes  Congress. of [place] - variety of support groups  [place] Call for more information  Narcotics Anonymous (NA), [facility] [address], [employer] [place]  2 meetings at this location   Special educational needs teacher         Address  Phone  Notes  ASAP Residential Treatment [place],    [place]  [phone]   [facility]  [address] [place] [id number], [place], [place]   [place], [place] [phone] Admissions: 8am-3pm M-F  Incentives Substance [place] [address]    [place], [place] [phone]   [facility] [address] [place], [place], [place]   [facility] [address],  [place], [place]   [facility] - Intensive Outpatient [address] Kristeen [address], [place], [place]   [facility] ([place].,  [place], [place] or [phone]   [facility] (RTS) [address], [place], [place] Accepts Medicaid  [facility] [place] [address],  [place] [phone] Substance Abuse/Addiction Treatment   [facility] Resources Organization         Address  Phone  Notes  Lexicographer Services  (  [phone]   Angie Fava, PhD [address], Ervin Knack [place], [place]   [phone] or [phone]   [facility] Behavioral   [address] [place], [place] [phone]   [facility] [address], [place], [place] [phone] Insurance/Medicaid/sponsorship through [facility] and Families [address]                                    [place], [place] [phone] Therapy/tele-psych/case  [facility] [address], [place] [phone]    Dr. Lolly Mustache  [phone]   [facility]  [employer] [facility] Dept. 1) [address], [place] 2) [address] [place] 3)  [address], [place] [phone]  [phone]   [facility] Child Abuse Hotline [phone] or [phone] (After Hours)

## 2013-09-15 NOTE — ED Notes (Signed)
Pt states that she began having chest pain last night around 2100. States she was also feeling SOB. States that the pain gets worse with deep inhalation. Denies any n/v, back pain, radiation of pain, or diaphoresis. Pt took two aspirin at home.

## 2013-11-02 ENCOUNTER — Ambulatory Visit (INDEPENDENT_AMBULATORY_CARE_PROVIDER_SITE_OTHER): Payer: Commercial Managed Care - PPO | Admitting: Emergency Medicine

## 2013-11-02 VITALS — BP 124/72 | HR 64 | Temp 98.4°F | Resp 18 | Ht 63.5 in | Wt 212.0 lb

## 2013-11-02 DIAGNOSIS — J4541 Moderate persistent asthma with (acute) exacerbation: Secondary | ICD-10-CM

## 2013-11-02 MED ORDER — ALBUTEROL SULFATE HFA 108 (90 BASE) MCG/ACT IN AERS: 2.0000 | INHALATION_SPRAY | RESPIRATORY_TRACT | Status: DC | PRN

## 2013-11-02 MED ORDER — FLUTICASONE PROPIONATE HFA 110 MCG/ACT IN AERO: 1.0000 | INHALATION_SPRAY | Freq: Two times a day (BID) | RESPIRATORY_TRACT | Status: DC

## 2013-11-02 NOTE — Patient Instructions (Signed)

## 2013-11-02 NOTE — Progress Notes (Signed)
Urgent Medical and [facility] [address], [place] [id number] [phone]  Date:  11/02/2013   Name:  Debra Horn   DOB:  [birth year]   MRN:  [id number]  PCP:  No PCP Per Patient    Chief Complaint: Asthma   History of Present Illness:  Debra Horn is a 28 y.o. very pleasant female patient who presents with the following:  History of asthma.  Only on albuterol.  Last night developed bronchospasm and has run out of albuterol. No cough or coryza.  No fever or chills.  Wheezing with minimal shortness of breath with exertion. No nausea or vomiting.   No rash or stool change.  No chest pain. No improvement with over the counter medications or other home remedies.  Denies other complaint or health concern today.   Patient Active Problem List   Diagnosis Date Noted  . Preterm NB deliv by C-section, 1,750-1,999 gm, 31-32 completed weeks 08/08/2012  . Previous cesarean section 08/08/2012  . Hx pulmonary embolism 07/24/2012  . Supervision of high-risk pregnancy 07/24/2012    Past Medical History  Diagnosis Date  . Asthma   . Preeclampsia   . Hyperthyroidism   . Anxiety   . PE (pulmonary embolism) 11/2011  . Preeclampsia   . Pregnancy induced hypertension   . Preterm labor   . Pulmonary embolism Nov 2013    Past Surgical History  Procedure Laterality Date  . Cesarean section      History  Substance Use Topics  . Smoking status: Never Smoker   . Smokeless tobacco: Never Used  . Alcohol Use: No     Comment: occasional    Family History  Problem Relation Age of Onset  . Hypothyroidism Maternal Aunt   . Hypertension Maternal Grandfather   . Hypertension Paternal Grandmother   . Cancer Paternal Grandmother   . Hyperlipidemia Paternal Grandmother   . Lupus Other   . Hypertension Father   . Cancer Sister   . Cancer Maternal Grandmother     Allergies  Allergen Reactions  . Banana Anaphylaxis  . Cefixime Rash    Medication list has been  reviewed and updated.  Current Outpatient Prescriptions on File Prior to Visit  Medication Sig Dispense Refill  . albuterol (PROVENTIL HFA;VENTOLIN HFA) 108 (90 BASE) MCG/ACT inhaler Inhale 2 puffs into the lungs every 4 (four) hours as needed for wheezing or shortness of breath. 1 Inhaler 0  . aspirin 325 MG tablet Take 650 mg by mouth daily.      No current facility-administered medications on file prior to visit.    Review of Systems:  As per HPI, otherwise negative.    Physical Examination: Filed Vitals:   11/02/13 1518  BP: 124/72  Pulse: 64  Temp: 98.4 F (36.9 C)  Resp: 18   Filed Vitals:   11/02/13 1518  Height: 5' 3.5" (1.613 m)  Weight: 212 lb (96.163 kg)   Body mass index is 36.96 kg/(m^2). Ideal Body Weight: Weight in (lb) to have BMI = 25: 143.1   GEN: WDWN, NAD, Non-toxic, Alert & Oriented x 3 HEENT: Atraumatic, Normocephalic.  Ears and Nose: No external deformity. EXTR: No clubbing/cyanosis/edema NEURO: Normal gait.  PSYCH: Normally interactive. Conversant. Not depressed or anxious appearing.  Calm demeanor.  CHEST:  BS= good air movement.  Some wheezing  Assessment and Plan: Asthma Refill albuterol Start flovent  Signed,  Phillips OdorJeffery Mickie Kozikowski, MD

## 2013-12-18 ENCOUNTER — Encounter (HOSPITAL_COMMUNITY): Payer: Self-pay | Admitting: *Deleted

## 2013-12-18 ENCOUNTER — Emergency Department (HOSPITAL_COMMUNITY)
Admission: EM | Admit: 2013-12-18 | Discharge: 2013-12-18 | Disposition: A | Discharge status: Home / Self Care | Payer: 59 | Attending: Emergency Medicine | Admitting: Emergency Medicine

## 2013-12-18 ENCOUNTER — Emergency Department (HOSPITAL_COMMUNITY): Payer: 59

## 2013-12-18 DIAGNOSIS — Z86711 Personal history of pulmonary embolism: Secondary | ICD-10-CM | POA: Diagnosis not present

## 2013-12-18 DIAGNOSIS — Z79899 Other long term (current) drug therapy: Secondary | ICD-10-CM | POA: Diagnosis not present

## 2013-12-18 DIAGNOSIS — J45901 Unspecified asthma with (acute) exacerbation: Secondary | ICD-10-CM | POA: Diagnosis not present

## 2013-12-18 DIAGNOSIS — Z8639 Personal history of other endocrine, nutritional and metabolic disease: Secondary | ICD-10-CM | POA: Insufficient documentation

## 2013-12-18 DIAGNOSIS — R079 Chest pain, unspecified: Secondary | ICD-10-CM | POA: Diagnosis present

## 2013-12-18 DIAGNOSIS — R000 Tachycardia, unspecified: Secondary | ICD-10-CM | POA: Diagnosis not present

## 2013-12-18 DIAGNOSIS — Z8751 Personal history of pre-term labor: Secondary | ICD-10-CM | POA: Diagnosis not present

## 2013-12-18 DIAGNOSIS — Z7982 Long term (current) use of aspirin: Secondary | ICD-10-CM | POA: Diagnosis not present

## 2013-12-18 DIAGNOSIS — F419 Anxiety disorder, unspecified: Secondary | ICD-10-CM | POA: Insufficient documentation

## 2013-12-18 DIAGNOSIS — J069 Acute upper respiratory infection, unspecified: Secondary | ICD-10-CM

## 2013-12-18 DIAGNOSIS — M791 Myalgia: Secondary | ICD-10-CM | POA: Diagnosis not present

## 2013-12-18 DIAGNOSIS — R509 Fever, unspecified: Secondary | ICD-10-CM

## 2013-12-18 DIAGNOSIS — Z7951 Long term (current) use of inhaled steroids: Secondary | ICD-10-CM | POA: Insufficient documentation

## 2013-12-18 DIAGNOSIS — R05 Cough: Secondary | ICD-10-CM

## 2013-12-18 DIAGNOSIS — R059 Cough, unspecified: Secondary | ICD-10-CM

## 2013-12-18 LAB — CBC
HCT: 34.2 % — ABNORMAL LOW (ref 36.0–46.0)
Hemoglobin: 10.4 g/dL — ABNORMAL LOW (ref 12.0–15.0)
MCH: 25.4 pg — AB (ref 26.0–34.0)
MCHC: 30.4 g/dL (ref 30.0–36.0)
MCV: 83.4 fL (ref 78.0–100.0)
PLATELETS: 392 10*3/uL (ref 150–400)
RBC: 4.1 MIL/uL (ref 3.87–5.11)
RDW: 16.3 % — ABNORMAL HIGH (ref 11.5–15.5)
WBC: 8.6 10*3/uL (ref 4.0–10.5)

## 2013-12-18 LAB — INFLUENZA PANEL BY PCR (TYPE A & B)
H1N1 flu by pcr: NOT DETECTED
Influenza A By PCR: NEGATIVE
Influenza B By PCR: NEGATIVE

## 2013-12-18 LAB — BASIC METABOLIC PANEL
Anion gap: 14 (ref 5–15)
BUN: 12 mg/dL (ref 6–23)
CALCIUM: 9.6 mg/dL (ref 8.4–10.5)
CO2: 22 mEq/L (ref 19–32)
CREATININE: 0.76 mg/dL (ref 0.50–1.10)
Chloride: 101 mEq/L (ref 96–112)
GFR calc Af Amer: >90 mL/min (ref >90)
GLUCOSE: 117 mg/dL — AB (ref 70–99)
Potassium: 4.2 mEq/L (ref 3.7–5.3)
Sodium: 137 mEq/L (ref 137–147)

## 2013-12-18 LAB — URINALYSIS, ROUTINE W REFLEX MICROSCOPIC
BILIRUBIN URINE: NEGATIVE
Glucose, UA: NEGATIVE mg/dL
Hgb urine dipstick: NEGATIVE
KETONES UR: NEGATIVE mg/dL
Leukocytes, UA: NEGATIVE
NITRITE: NEGATIVE
Protein, ur: NEGATIVE mg/dL
Specific Gravity, Urine: 1.019 (ref 1.005–1.030)
Urobilinogen, UA: 1 mg/dL (ref 0.0–1.0)
pH: 8.5 — ABNORMAL HIGH (ref 5.0–8.0)

## 2013-12-18 LAB — D-DIMER, QUANTITATIVE (NOT AT ARMC): D DIMER QUANT: 0.38 ug{FEU}/mL (ref 0.00–0.48)

## 2013-12-18 LAB — I-STAT CG4 LACTIC ACID, ED
Lactic Acid, Venous: 1.26 mmol/L (ref 0.5–2.2)
Lactic Acid, Venous: 0.46 mmol/L — ABNORMAL LOW (ref 0.5–2.2)

## 2013-12-18 LAB — I-STAT TROPONIN, ED: TROPONIN I, POC: 0 ng/mL (ref 0.00–0.08)

## 2013-12-18 MED ORDER — LORAZEPAM 2 MG/ML IJ SOLN
1.0000 mg | Freq: Once | INTRAMUSCULAR | Status: AC
  Administered 2013-12-18: 1 mg via INTRAVENOUS
  Filled 2013-12-18: qty 1

## 2013-12-18 MED ORDER — IPRATROPIUM BROMIDE 0.02 % IN SOLN
0.5000 mg | Freq: Once | RESPIRATORY_TRACT | Status: AC
  Administered 2013-12-18: 0.5 mg via RESPIRATORY_TRACT
  Filled 2013-12-18: qty 2.5

## 2013-12-18 MED ORDER — IBUPROFEN 200 MG PO TABS
400.0000 mg | ORAL_TABLET | Freq: Once | ORAL | Status: AC
  Administered 2013-12-18: 400 mg via ORAL
  Filled 2013-12-18: qty 2

## 2013-12-18 MED ORDER — ACETAMINOPHEN 325 MG PO TABS
650.0000 mg | ORAL_TABLET | Freq: Four times a day (QID) | ORAL | Status: DC | PRN
  Administered 2013-12-18: 650 mg via ORAL
  Filled 2013-12-18: qty 2

## 2013-12-18 MED ORDER — ACETAMINOPHEN 325 MG PO TABS
650.0000 mg | ORAL_TABLET | Freq: Once | ORAL | Status: AC
  Administered 2013-12-18: 650 mg via ORAL
  Filled 2013-12-18: qty 2

## 2013-12-18 MED ORDER — OSELTAMIVIR PHOSPHATE 75 MG PO CAPS: 75.0000 mg | ORAL_CAPSULE | Freq: Two times a day (BID) | ORAL | Status: DC

## 2013-12-18 MED ORDER — ALBUTEROL SULFATE (2.5 MG/3ML) 0.083% IN NEBU
5.0000 mg | INHALATION_SOLUTION | Freq: Once | RESPIRATORY_TRACT | Status: AC
  Administered 2013-12-18: 5 mg via RESPIRATORY_TRACT
  Filled 2013-12-18: qty 6

## 2013-12-18 NOTE — ED Notes (Signed)
Pt calling for ride home 

## 2013-12-18 NOTE — ED Notes (Signed)
Pt w/ cough x3 days, today began experiencing substernal chest pain w/ associating left arm pain as well as shortness of breath - pt concerned d/t hx of PE.

## 2013-12-18 NOTE — Discharge Instructions (Signed)
It was our pleasure to provide your ER care today - we hope that you feel better.  Rest. Drink plenty of fluids.  Take tylenol and motrin as need for fever.  You may also try multi-symptom, over the counter, cold/flu medication as need for symptom relief.   Use albuterol inhaler as need for wheezing.  A flu test was sent the results of which will be back later this morning - call to check on results around noon today.  If positive, fill your prescription for tamiflu, and take as prescribed.  Follow up with primary care doctor in the next couple days for recheck if symptoms fail to improve/resolve.  Return to ER right away if worse, new symptoms, chest pain, trouble breathing, other concern.      Fever, Adult A fever is a higher than normal body temperature. In an adult, an oral temperature around 98.6 F (37 C) is considered normal. A temperature of 100.4 F (38 C) or higher is generally considered a fever. Mild or moderate fevers generally have no long-term effects and often do not require treatment. Extreme fever (greater than or equal to 106 F or 41.1 C) can cause seizures. The sweating that may occur with repeated or prolonged fever may cause dehydration. Elderly people can develop confusion during a fever. A measured temperature can vary with:  Age.  Time of day.  Method of measurement (mouth, underarm, rectal, or ear). The fever is confirmed by taking a temperature with a thermometer. Temperatures can be taken different ways. Some methods are accurate and some are not.  An oral temperature is used most commonly. Electronic thermometers are fast and accurate.  An ear temperature will only be accurate if the thermometer is positioned as recommended by the manufacturer.  A rectal temperature is accurate and done for those adults who have a condition where an oral temperature cannot be taken.  An underarm (axillary) temperature is not accurate and not recommended. Fever is a  symptom, not a disease.  CAUSES   Infections commonly cause fever.  Some noninfectious causes for fever include:  Some arthritis conditions.  Some thyroid or adrenal gland conditions.  Some immune system conditions.  Some types of cancer.  A medicine reaction.  High doses of certain street drugs such as methamphetamine.  Dehydration.  Exposure to high outside or room temperatures.  Occasionally, the source of a fever cannot be determined. This is sometimes called a "fever of unknown origin" (FUO).  Some situations may lead to a temporary rise in body temperature that may go away on its own. Examples are:  Childbirth.  Surgery.  Intense exercise. HOME CARE INSTRUCTIONS   Take appropriate medicines for fever. Follow dosing instructions carefully. If you use acetaminophen to reduce the fever, be careful to avoid taking other medicines that also contain acetaminophen. Do not take aspirin for a fever if you are younger than age 419. There is an association with Reye's syndrome. Reye's syndrome is a rare but potentially deadly disease.  If an infection is present and antibiotics have been prescribed, take them as directed. Finish them even if you start to feel better.  Rest as needed.  Maintain an adequate fluid intake. To prevent dehydration during an illness with prolonged or recurrent fever, you may need to drink extra fluid.Drink enough fluids to keep your urine clear or pale yellow.  Sponging or bathing with room temperature water may help reduce body temperature. Do not use ice water or alcohol sponge baths.  Dress  comfortably, but do not over-bundle. SEEK MEDICAL CARE IF:   You are unable to keep fluids down.  You develop vomiting or diarrhea.  You are not feeling at least partly better after 3 days.  You develop new symptoms or problems. SEEK IMMEDIATE MEDICAL CARE IF:   You have shortness of breath or trouble breathing.  You develop excessive  weakness.  You are dizzy or you faint.  You are extremely thirsty or you are making little or no urine.  You develop new pain that was not there before (such as in the head, neck, chest, back, or abdomen).  You have persistent vomiting and diarrhea for more than 1 to 2 days.  You develop a stiff neck or your eyes become sensitive to light.  You develop a skin rash.  You have a fever or persistent symptoms for more than 2 to 3 days.  You have a fever and your symptoms suddenly get worse. MAKE SURE YOU:   Understand these instructions.  Will watch your condition.  Will get help right away if you are not doing well or get worse. Document Released: 06/14/2000 Document Revised: 05/05/2013 Document Reviewed: 10/20/2010 [facility] Patient Information 2015 [place], [place]. This information is not intended to replace advice given to you by your health care provider. Make sure you discuss any questions you have with your health care provider.     Cough, Adult  A cough is a reflex that helps clear your throat and airways. It can help heal the body or may be a reaction to an irritated airway. A cough may only last 2 or 3 weeks (acute) or may last more than 8 weeks (chronic).  CAUSES Acute cough:  Viral or bacterial infections. Chronic cough:  Infections.  Allergies.  Asthma.  Post-nasal drip.  Smoking.  Heartburn or acid reflux.  Some medicines.  Chronic lung problems (COPD).  Cancer. SYMPTOMS   Cough.  Fever.  Chest pain.  Increased breathing rate.  High-pitched whistling sound when breathing (wheezing).  Colored mucus that you cough up (sputum). TREATMENT   A bacterial cough may be treated with antibiotic medicine.  A viral cough must run its course and will not respond to antibiotics.  Your caregiver may recommend other treatments if you have a chronic cough. HOME CARE INSTRUCTIONS   Only take over-the-counter or prescription medicines for pain,  discomfort, or fever as directed by your caregiver. Use cough suppressants only as directed by your caregiver.  Use a cold steam vaporizer or humidifier in your bedroom or home to help loosen secretions.  Sleep in a semi-upright position if your cough is worse at night.  Rest as needed.  Stop smoking if you smoke. SEEK IMMEDIATE MEDICAL CARE IF:   You have pus in your sputum.  Your cough starts to worsen.  You cannot control your cough with suppressants and are losing sleep.  You begin coughing up blood.  You have difficulty breathing.  You develop pain which is getting worse or is uncontrolled with medicine.  You have a fever. MAKE SURE YOU:   Understand these instructions.  Will watch your condition.  Will get help right away if you are not doing well or get worse. Document Released: 06/17/2010 Document Revised: 03/13/2011 Document Reviewed: 06/17/2010 [facility] Patient Information 2015 [place], [place]. This information is not intended to replace advice given to you by your health care provider. Make sure you discuss any questions you have with your health care provider.    Upper Respiratory Infection,  Adult An upper respiratory infection (URI) is also sometimes known as the common cold. The upper respiratory tract includes the nose, sinuses, throat, trachea, and bronchi. Bronchi are the airways leading to the lungs. Most people improve within 1 week, but symptoms can last up to 2 weeks. A residual cough may last even longer.  CAUSES Many different viruses can infect the tissues lining the upper respiratory tract. The tissues become irritated and inflamed and often become very moist. Mucus production is also common. A cold is contagious. You can easily spread the virus to others by oral contact. This includes kissing, sharing a glass, coughing, or sneezing. Touching your mouth or nose and then touching a surface, which is then touched by another person, can also spread the  virus. SYMPTOMS  Symptoms typically develop 1 to 3 days after you come in contact with a cold virus. Symptoms vary from person to person. They may include:  Runny nose.  Sneezing.  Nasal congestion.  Sinus irritation.  Sore throat.  Loss of voice (laryngitis).  Cough.  Fatigue.  Muscle aches.  Loss of appetite.  Headache.  Low-grade fever. DIAGNOSIS  You might diagnose your own cold based on familiar symptoms, since most people get a cold 2 to 3 times a year. Your caregiver can confirm this based on your exam. Most importantly, your caregiver can check that your symptoms are not due to another disease such as strep throat, sinusitis, pneumonia, asthma, or epiglottitis. Blood tests, throat tests, and X-rays are not necessary to diagnose a common cold, but they may sometimes be helpful in excluding other more serious diseases. Your caregiver will decide if any further tests are required. RISKS AND COMPLICATIONS  You may be at risk for a more severe case of the common cold if you smoke cigarettes, have chronic heart disease (such as heart failure) or lung disease (such as asthma), or if you have a weakened immune system. The very young and very old are also at risk for more serious infections. Bacterial sinusitis, middle ear infections, and bacterial pneumonia can complicate the common cold. The common cold can worsen asthma and chronic obstructive pulmonary disease (COPD). Sometimes, these complications can require emergency medical care and may be life-threatening. PREVENTION  The best way to protect against getting a cold is to practice good hygiene. Avoid oral or hand contact with people with cold symptoms. Wash your hands often if contact occurs. There is no clear evidence that vitamin C, vitamin E, echinacea, or exercise reduces the chance of developing a cold. However, it is always recommended to get plenty of rest and practice good nutrition. TREATMENT  Treatment is directed at  relieving symptoms. There is no cure. Antibiotics are not effective, because the infection is caused by a virus, not by bacteria. Treatment may include:  Increased fluid intake. Sports drinks offer valuable electrolytes, sugars, and fluids.  Breathing heated mist or steam (vaporizer or shower).  Eating chicken soup or other clear broths, and maintaining good nutrition.  Getting plenty of rest.  Using gargles or lozenges for comfort.  Controlling fevers with ibuprofen or acetaminophen as directed by your caregiver.  Increasing usage of your inhaler if you have asthma. Zinc gel and zinc lozenges, taken in the first 24 hours of the common cold, can shorten the duration and lessen the severity of symptoms. Pain medicines may help with fever, muscle aches, and throat pain. A variety of non-prescription medicines are available to treat congestion and runny nose. Your caregiver can make  recommendations and may suggest nasal or lung inhalers for other symptoms.  HOME CARE INSTRUCTIONS   Only take over-the-counter or prescription medicines for pain, discomfort, or fever as directed by your caregiver.  Use a warm mist humidifier or inhale steam from a shower to increase air moisture. This may keep secretions moist and make it easier to breathe.  Drink enough water and fluids to keep your urine clear or pale yellow.  Rest as needed.  Return to work when your temperature has returned to normal or as your caregiver advises. You may need to stay home longer to avoid infecting others. You can also use a face mask and careful hand washing to prevent spread of the virus. SEEK MEDICAL CARE IF:   After the first few days, you feel you are getting worse rather than better.  You need your caregiver's advice about medicines to control symptoms.  You develop chills, worsening shortness of breath, or brown or red sputum. These may be signs of pneumonia.  You develop yellow or brown nasal discharge or pain  in the face, especially when you bend forward. These may be signs of sinusitis.  You develop a fever, swollen neck glands, pain with swallowing, or white areas in the back of your throat. These may be signs of strep throat. SEEK IMMEDIATE MEDICAL CARE IF:   You have a fever.  You develop severe or persistent headache, ear pain, sinus pain, or chest pain.  You develop wheezing, a prolonged cough, cough up blood, or have a change in your usual mucus (if you have chronic lung disease).  You develop sore muscles or a stiff neck. Document Released: 06/14/2000 Document Revised: 03/13/2011 Document Reviewed: 03/26/2013 [facility] Patient Information 2015 [place], [place]. This information is not intended to replace advice given to you by your health care provider. Make sure you discuss any questions you have with your health care provider.

## 2013-12-18 NOTE — ED Provider Notes (Signed)
CSN: [id number]     Arrival date & time 12/18/13  0211 History   First MD Initiated Contact with Patient 12/18/13 0236     Chief Complaint  Patient presents with  . Chest Pain  . Shortness of Breath     (Consider location/radiation/quality/duration/timing/severity/associated sxs/prior Treatment) Patient is a 28 y.o. female presenting with chest pain and shortness of breath. The history is provided by the patient.  Chest Pain Associated symptoms: cough, fever and shortness of breath   Associated symptoms: no abdominal pain, no back pain, no diaphoresis, no headache, no nausea, no palpitations and not vomiting   Shortness of Breath Associated symptoms: chest pain, cough, fever and wheezing   Associated symptoms: no abdominal pain, no diaphoresis, no headaches, no neck pain, no rash, no sore throat and no vomiting   pt w hx asthma, c/o non prod cough for the past 2-3 days, episodic, persistent. +fevers, t 103 in ED.   Pt w nasal and chest congestion, body aches, c/o chest wall soreness w coughing episodes. No other episodic or exertional cp or discomfort. No constant, focal, or pleuritic chest pain. Pt w remote hx PE, unsure of cause x to say was on bcp then, states many years ago out of [place]. No fam hx dvt/PE. Pt w no hx recurrent dvt or pe. No recent surgery, immobility, trauma, or travel. No leg pain or swelling. Non smoker. No hx cad, no fam hx premature cad.  w asthma, states uses inhalers daily, no recent oral steroid use, no recent admission, no hx icu/intubation.       Past Medical History  Diagnosis Date  . Asthma   . Preeclampsia   . Hyperthyroidism   . Anxiety   . PE (pulmonary embolism) 11/2011  . Preeclampsia   . Pregnancy induced hypertension   . Preterm labor   . Pulmonary embolism Nov 2013   Past Surgical History  Procedure Laterality Date  . Cesarean section     Family History  Problem Relation Age of Onset  . Hypothyroidism Maternal Aunt   . Hypertension  Maternal Grandfather   . Hypertension Paternal Grandmother   . Cancer Paternal Grandmother   . Hyperlipidemia Paternal Grandmother   . Lupus Other   . Hypertension Father   . Cancer Sister   . Cancer Maternal Grandmother    History  Substance Use Topics  . Smoking status: Never Smoker   . Smokeless tobacco: Never Used  . Alcohol Use: No     Comment: occasional   OB History    Gravida Para Term Preterm AB TAB SAB Ectopic Multiple Living   2 1  1      1      Review of Systems  Constitutional: Positive for fever. Negative for chills and diaphoresis.  HENT: Positive for congestion. Negative for sore throat.   Eyes: Negative for pain, discharge and redness.  Respiratory: Positive for cough, shortness of breath and wheezing.   Cardiovascular: Positive for chest pain. Negative for palpitations and leg swelling.  Gastrointestinal: Negative for nausea, vomiting, abdominal pain and diarrhea.  Endocrine: Negative for polyuria.  Genitourinary: Negative for dysuria and flank pain.  Musculoskeletal: Positive for myalgias. Negative for back pain and neck pain.  Skin: Negative for rash.  Neurological: Negative for headaches.  Hematological: Does not bruise/bleed easily.  Psychiatric/Behavioral: The patient is nervous/anxious.       Allergies  Banana and Cefixime  Home Medications   Prior to Admission medications   Medication Sig Start Date  End Date Taking? Authorizing Provider  albuterol (PROVENTIL HFA;VENTOLIN HFA) 108 (90 BASE) MCG/ACT inhaler Inhale 2 puffs into the lungs every 4 (four) hours as needed for wheezing or shortness of breath. 11/02/13  Yes Carmelina DaneJeffery S Anderson, MD  aspirin 325 MG tablet Take 650 mg by mouth daily.    Yes Historical Provider, MD  fluticasone (FLOVENT HFA) 110 MCG/ACT inhaler Inhale 1 puff into the lungs 2 (two) times daily. 11/02/13  Yes Carmelina DaneJeffery S Anderson, MD   BP 125/82 mmHg  Pulse 103  Temp(Src) 102.4 F (39.1 C) (Oral)  Resp 15  Ht 5\' 2"  (1.575 m)   Wt 205 lb (92.987 kg)  BMI 37.49 kg/m2  SpO2 99%  LMP 12/01/2013 (Approximate) Physical Exam  Constitutional: She is oriented to person, place, and time. She appears well-developed and well-nourished. No distress.  Febrile, anxious, tachycardic.   HENT:  Mouth/Throat: Oropharynx is clear and moist.  Nasal congestion, no sinus pressure/pain  Eyes: Conjunctivae are normal. No scleral icterus.  Neck: Normal range of motion. Neck supple. No tracheal deviation present.  No stiffness or rigidity  Cardiovascular: Regular rhythm, normal heart sounds and intact distal pulses.  Exam reveals no gallop and no friction rub.   No murmur heard. tachycardic  Pulmonary/Chest: Effort normal. No respiratory distress. She has wheezes. She exhibits tenderness.  Mild wheezing, good air exchange.   Abdominal: Soft. Normal appearance and bowel sounds are normal. She exhibits no distension and no mass. There is no tenderness. There is no rebound and no guarding.  Genitourinary:  No cva tenderness  Musculoskeletal: She exhibits no edema or tenderness.  Lymphadenopathy:    She has no cervical adenopathy.  Neurological: She is alert and oriented to person, place, and time.  Skin: Skin is warm and dry. No rash noted.  No rash, no petechia  Psychiatric:  Anxious, tearful.   Nursing note and vitals reviewed.   ED Course  Procedures (including critical care time) Labs Review   Results for orders placed or performed during the hospital encounter of 12/18/13  CBC  Result Value Ref Range   WBC 8.6 4.0 - 10.5 K/uL   RBC 4.10 3.87 - 5.11 MIL/uL   Hemoglobin 10.4 (L) 12.0 - 15.0 g/dL   HCT 40.934.2 (L) 81.136.0 - 91.446.0 %   MCV 83.4 78.0 - 100.0 fL   MCH 25.4 (L) 26.0 - 34.0 pg   MCHC 30.4 30.0 - 36.0 g/dL   RDW 78.216.3 (H) 95.611.5 - 21.315.5 %   Platelets 392 150 - 400 K/uL  Basic metabolic panel  Result Value Ref Range   Sodium 137 137 - 147 mEq/L   Potassium 4.2 3.7 - 5.3 mEq/L   Chloride 101 96 - 112 mEq/L   CO2  22 19 - 32 mEq/L   Glucose, Bld 117 (H) 70 - 99 mg/dL   BUN 12 6 - 23 mg/dL   Creatinine, Ser 0.860.76 0.50 - 1.10 mg/dL   Calcium 9.6 8.4 - 57.810.5 mg/dL   GFR calc non Af Amer >90 >90 mL/min   GFR calc Af Amer >90 >90 mL/min   Anion gap 14 5 - 15  Urinalysis, Routine w reflex microscopic  Result Value Ref Range   Color, Urine YELLOW YELLOW   APPearance HAZY (A) CLEAR   Specific Gravity, Urine 1.019 1.005 - 1.030   pH 8.5 (H) 5.0 - 8.0   Glucose, UA NEGATIVE NEGATIVE mg/dL   Hgb urine dipstick NEGATIVE NEGATIVE   Bilirubin Urine NEGATIVE NEGATIVE  Ketones, ur NEGATIVE NEGATIVE mg/dL   Protein, ur NEGATIVE NEGATIVE mg/dL   Urobilinogen, UA 1.0 0.0 - 1.0 mg/dL   Nitrite NEGATIVE NEGATIVE   Leukocytes, UA NEGATIVE NEGATIVE  D-dimer, quantitative  Result Value Ref Range   D-Dimer, Quant 0.38 0.00 - 0.48 ug/mL-FEU  [facility] troponin[facility])  Result Value Ref Range   Troponin i, poc 0.00 0.00 - 0.08 ng/mL   Comment 3          I-Stat CG4 Lactic Acid, ED  Result Value Ref Range   Lactic Acid, Venous 1.26 0.5 - 2.2 mmol/L  I-Stat CG4 Lactic Acid, ED  Result Value Ref Range   Lactic Acid, Venous 0.46 (L) 0.5 - 2.2 mmol/L   Dg Chest Port 1 View  12/18/2013   CLINICAL DATA:  Chest pain and shortness of breath  EXAM: PORTABLE CHEST - 1 VIEW  COMPARISON:  09/15/2013  FINDINGS: Normal heart size and mediastinal contours. No acute infiltrate or edema. No effusion or pneumothorax. No acute osseous findings.  IMPRESSION: Negative chest.   Electronically Signed   By: Tiburcio PeaJonathan  Watts M.D.   On: 12/18/2013 03:49         EKG Interpretation   Date/Time:  Thursday December 18 2013 02:24:58 EST Ventricular Rate:  129 PR Interval:  86 QRS Duration: 113 QT Interval:  317 QTC Calculation: 464 R Axis:   87 Text Interpretation:  Sinus tachycardia Non-specific ST-t changes  Confirmed by Denton LankSTEINL  MD, Addison Whidbee ([id number]) on 12/18/2013 2:43:22 AM      MDM   Iv ns. Labs.  Reviewed nursing  notes and prior charts for additional history.   Albuterol and atrovent neb.   Pt appears anxious/upset, hx anxiety, reassurance provided. Ativan 1 mg iv.  Recheck pt much calmer.   Hr 84, rr 16.  ddimer neg.  On review past 5 ct angio chests, no PE.   Pt febrile w uri symptoms, flu test still pending.  Recheck pt remains breathing comfortably, no wheezing or increased wob after prolonged period obs in ed and only single neb.  Hr 80 rr 16, pulse ox 99% room air. No infiltrate on cxr.   Given constellation uri symptoms, will give rx for possible flu.     Suzi RootsKevin E Secilia Apps, MD 12/18/13 [phone]

## 2013-12-19 LAB — URINE CULTURE: Colony Count: >=100000

## 2013-12-24 LAB — CULTURE, BLOOD (ROUTINE X 2)
CULTURE: NO GROWTH
Culture: NO GROWTH

## 2014-01-07 ENCOUNTER — Other Ambulatory Visit: Payer: Self-pay

## 2014-01-07 MED ORDER — FLUTICASONE PROPIONATE HFA 110 MCG/ACT IN AERO: 1.0000 | INHALATION_SPRAY | Freq: Two times a day (BID) | RESPIRATORY_TRACT | Status: DC

## 2014-01-20 ENCOUNTER — Ambulatory Visit (INDEPENDENT_AMBULATORY_CARE_PROVIDER_SITE_OTHER): Payer: Commercial Managed Care - PPO | Admitting: Obstetrics

## 2014-01-20 VITALS — BP 125/92 | HR 79 | Temp 98.7°F | Wt 213.0 lb

## 2014-01-20 DIAGNOSIS — N760 Acute vaginitis: Secondary | ICD-10-CM

## 2014-01-20 DIAGNOSIS — N9489 Other specified conditions associated with female genital organs and menstrual cycle: Secondary | ICD-10-CM

## 2014-01-20 DIAGNOSIS — B9689 Other specified bacterial agents as the cause of diseases classified elsewhere: Secondary | ICD-10-CM

## 2014-01-20 DIAGNOSIS — A499 Bacterial infection, unspecified: Secondary | ICD-10-CM

## 2014-01-20 DIAGNOSIS — N898 Other specified noninflammatory disorders of vagina: Secondary | ICD-10-CM

## 2014-01-20 MED ORDER — METRONIDAZOLE 500 MG PO TABS: 500.0000 mg | ORAL_TABLET | Freq: Two times a day (BID) | ORAL | Status: DC

## 2014-01-20 NOTE — Progress Notes (Signed)
Patient ID: Debra BrazenLauren A Dewoody, female   DOB: 11-23-1985, 29 y.o.   MRN: [id number]  Chief Complaint  Patient presents with  . Personal Problem    ? lost tampon    HPI Debra BrazenLauren A Boomer is a 29 y.o. female.  Vaginal odor.  Not sure if tampon left in vagina.  HPI  Past Medical History  Diagnosis Date  . Asthma   . Preeclampsia   . Hyperthyroidism   . Anxiety   . PE (pulmonary embolism) 11/2011  . Preeclampsia   . Pregnancy induced hypertension   . Preterm labor   . Pulmonary embolism Nov 2013    Past Surgical History  Procedure Laterality Date  . Cesarean section      Family History  Problem Relation Age of Onset  . Hypothyroidism Maternal Aunt   . Hypertension Maternal Grandfather   . Hypertension Paternal Grandmother   . Cancer Paternal Grandmother   . Hyperlipidemia Paternal Grandmother   . Lupus Other   . Hypertension Father   . Cancer Sister   . Cancer Maternal Grandmother     Social History History  Substance Use Topics  . Smoking status: Never Smoker   . Smokeless tobacco: Never Used  . Alcohol Use: No     Comment: occasional    Allergies  Allergen Reactions  . Banana Anaphylaxis  . Cefixime Rash    Current Outpatient Prescriptions  Medication Sig Dispense Refill  . albuterol (PROVENTIL HFA;VENTOLIN HFA) 108 (90 BASE) MCG/ACT inhaler Inhale 2 puffs into the lungs every 4 (four) hours as needed for wheezing or shortness of breath. 1 Inhaler 12  . aspirin 325 MG tablet Take 650 mg by mouth daily.     . fluticasone (FLOVENT HFA) 110 MCG/ACT inhaler Inhale 1 puff into the lungs 2 (two) times daily. 3 Inhaler 2  . metroNIDAZOLE (FLAGYL) 500 MG tablet Take 1 tablet (500 mg total) by mouth 2 (two) times daily. 14 tablet 2  . oseltamivir (TAMIFLU) 75 MG capsule Take 1 capsule (75 mg total) by mouth every 12 (twelve) hours. (Patient not taking: Reported on 01/20/2014) 10 capsule 0   No current facility-administered medications for this visit.    Review of  Systems Review of Systems Constitutional: negative for fatigue and weight loss Respiratory: negative for cough and wheezing Cardiovascular: negative for chest pain, fatigue and palpitations Gastrointestinal: negative for abdominal pain and change in bowel habits Genitourinary: vaginal odor Integument/breast: negative for nipple discharge Musculoskeletal:negative for myalgias Neurological: negative for gait problems and tremors Behavioral/Psych: negative for abusive relationship, depression Endocrine: negative for temperature intolerance     Blood pressure 125/92, pulse 79, temperature 98.7 F (37.1 C), weight 213 lb (96.616 kg), last menstrual period 01/11/2014.  Physical Exam Physical Exam General:   alert  Skin:   no rash or abnormalities  Lungs:   clear to auscultation bilaterally  Heart:   regular rate and rhythm, S1, S2 normal, no murmur, click, rub or gallop  Breasts:   normal without suspicious masses, skin or nipple changes or axillary nodes  Abdomen:  normal findings: no organomegaly, soft, non-tender and no hernia  Pelvis:  External genitalia: normal general appearance Urinary system: urethral meatus normal and bladder without fullness, nontender Vaginal: normal without tenderness, induration or masses Cervix: normal appearance Adnexa: normal bimanual exam Uterus: anteverted and non-tender, normal size    Data Reviewed Labs   Assessment    Vaginal odor BV    Plan   Wet prep and cultures sent Flagyl  Rx F/U 4 months for pap smear   Orders Placed This Encounter  Procedures  . SureSwab, Vaginosis/Vaginitis Plus   Meds ordered this encounter  Medications  . metroNIDAZOLE (FLAGYL) 500 MG tablet    Sig: Take 1 tablet (500 mg total) by mouth 2 (two) times daily.    Dispense:  14 tablet    Refill:  2

## 2014-01-21 ENCOUNTER — Ambulatory Visit: Payer: Commercial Managed Care - PPO | Admitting: Obstetrics

## 2014-01-23 ENCOUNTER — Other Ambulatory Visit: Payer: Self-pay | Admitting: Obstetrics

## 2014-01-23 LAB — SURESWAB, VAGINOSIS/VAGINITIS PLUS
ATOPOBIUM VAGINAE: 7.4 Log (cells/mL)
C. ALBICANS, DNA: NOT DETECTED
C. PARAPSILOSIS, DNA: NOT DETECTED
C. TRACHOMATIS RNA, TMA: NOT DETECTED
C. glabrata, DNA: NOT DETECTED
C. tropicalis, DNA: NOT DETECTED
Gardnerella vaginalis: >8 Log (cells/mL)
LACTOBACILLUS SPECIES: NOT DETECTED Log (cells/mL)
MEGASPHAERA SPECIES: 8 Log (cells/mL)
N. GONORRHOEAE RNA, TMA: NOT DETECTED
T. VAGINALIS RNA, QL TMA: NOT DETECTED

## 2014-01-26 ENCOUNTER — Ambulatory Visit: Payer: Commercial Managed Care - PPO | Admitting: Obstetrics

## 2014-02-19 ENCOUNTER — Ambulatory Visit (INDEPENDENT_AMBULATORY_CARE_PROVIDER_SITE_OTHER): Payer: 59 | Admitting: Family

## 2014-02-19 ENCOUNTER — Other Ambulatory Visit (INDEPENDENT_AMBULATORY_CARE_PROVIDER_SITE_OTHER): Payer: 59

## 2014-02-19 ENCOUNTER — Encounter: Payer: Self-pay | Admitting: Family

## 2014-02-19 VITALS — BP 142/94 | HR 80 | Temp 97.5°F | Resp 18 | Ht 63.0 in | Wt 216.8 lb

## 2014-02-19 DIAGNOSIS — E0590 Thyrotoxicosis, unspecified without thyrotoxic crisis or storm: Secondary | ICD-10-CM

## 2014-02-19 DIAGNOSIS — R42 Dizziness and giddiness: Secondary | ICD-10-CM

## 2014-02-19 DIAGNOSIS — Z832 Family history of diseases of the blood and blood-forming organs and certain disorders involving the immune mechanism: Secondary | ICD-10-CM

## 2014-02-19 DIAGNOSIS — Z8269 Family history of other diseases of the musculoskeletal system and connective tissue: Secondary | ICD-10-CM

## 2014-02-19 DIAGNOSIS — J4520 Mild intermittent asthma, uncomplicated: Secondary | ICD-10-CM

## 2014-02-19 LAB — BASIC METABOLIC PANEL
BUN: 11 mg/dL (ref 6–23)
CO2: 26 meq/L (ref 19–32)
Calcium: 8.9 mg/dL (ref 8.4–10.5)
Chloride: 104 mEq/L (ref 96–112)
Creatinine, Ser: 0.54 mg/dL (ref 0.40–1.20)
GFR: 172.17 mL/min (ref >60.00)
Glucose, Bld: 86 mg/dL (ref 70–99)
Potassium: 3.8 mEq/L (ref 3.5–5.1)
Sodium: 134 mEq/L — ABNORMAL LOW (ref 135–145)

## 2014-02-19 LAB — CBC
HEMATOCRIT: 32.7 % — AB (ref 36.0–46.0)
HEMOGLOBIN: 10.5 g/dL — AB (ref 12.0–15.0)
MCHC: 32.2 g/dL (ref 30.0–36.0)
MCV: 80.2 fl (ref 78.0–100.0)
PLATELETS: 379 10*3/uL (ref 150.0–400.0)
RBC: 4.07 Mil/uL (ref 3.87–5.11)
RDW: 17.2 % — ABNORMAL HIGH (ref 11.5–15.5)
WBC: 5.7 10*3/uL (ref 4.0–10.5)

## 2014-02-19 LAB — T4, FREE: Free T4: 1.13 ng/dL (ref 0.60–1.60)

## 2014-02-19 LAB — C-REACTIVE PROTEIN: CRP: 1.5 mg/dL (ref 0.5–20.0)

## 2014-02-19 LAB — TSH: TSH: 1.56 u[IU]/mL (ref 0.35–4.50)

## 2014-02-19 LAB — HEMOGLOBIN A1C: Hgb A1c MFr Bld: 5.6 % (ref 4.6–6.5)

## 2014-02-19 NOTE — Progress Notes (Signed)
Pre visit review using our clinic review tool, if applicable. No additional management support is needed unless otherwise documented below in the visit note. 

## 2014-02-19 NOTE — Assessment & Plan Note (Signed)
Was diagnosed during pregnancy with hyperthyroidism. Has a family history of hypothyroidism. Obtain TSH and T4 to determine current status.

## 2014-02-19 NOTE — Assessment & Plan Note (Signed)
Appears stable at this time. Continue current dosage of flovent and albuterol as previously prescribed.

## 2014-02-19 NOTE — Assessment & Plan Note (Signed)
Patient expresses senses of lightheadedness that is not associated with change in position. She does express heavy menstral cycle. Obtain CBC, BMET, and TSH to rule out metabolic causes. Cannot rule out underlying anxiety as a cause of the dizziness and chest pain. Follow up pending lab work.

## 2014-02-19 NOTE — Progress Notes (Signed)
Subjective:    Patient ID: Debra Horn, female    DOB: Sep 15, 1985, 29 y.o.   MRN: [id number]  Chief Complaint  Patient presents with  . Establish Care    x1 month, dizzy spells on and off, even dizzy when laying down, sometimes the room is spinning and sometimes its just a lightheaded feeling    HPI:  Debra Horn is a 29 y.o. female who presents today to establish care and discuss dizzy spells.   1) Hyperthyroidism - was previously diagnosed with hyperthyroidism during her first pregnancy in 2008. Last TSH was normal in 08/2012 at 1.539. Not currently taking any medications or receiving any treatment.    2) Asthma - Indicates that she has occasional shortness of breath and is not relieved by albuterol. States that she only uses at night when she feels like she cannot breath. Does experience tingling in her fingers on occasion.   3) Dizziness - This is a new problem. Indicates the associated symptoms of dizziness that has been going on for about a month. Describes the dizziness as feeling lightheaded. Exacerbated by anything with caffeine and sweets. Has tried to eat almonds which has not provided any relief. Notes the feeling in all positions.    Allergies  Allergen Reactions  . Banana Anaphylaxis  . Cefixime Rash    Current Outpatient Prescriptions on File Prior to Visit  Medication Sig Dispense Refill  . albuterol (PROVENTIL HFA;VENTOLIN HFA) 108 (90 BASE) MCG/ACT inhaler Inhale 2 puffs into the lungs every 4 (four) hours as needed for wheezing or shortness of breath. 1 Inhaler 12  . aspirin 325 MG tablet Take 650 mg by mouth daily.     . fluticasone (FLOVENT HFA) 110 MCG/ACT inhaler Inhale 1 puff into the lungs 2 (two) times daily. 3 Inhaler 2  . metroNIDAZOLE (FLAGYL) 500 MG tablet Take 1 tablet (500 mg total) by mouth 2 (two) times daily. 14 tablet 2   No current facility-administered medications on file prior to visit.    Past Medical History  Diagnosis Date    . Asthma   . Preeclampsia   . Hyperthyroidism   . Anxiety   . PE (pulmonary embolism) 11/2011  . Preeclampsia   . Pregnancy induced hypertension   . Preterm labor   . Pulmonary embolism Nov 2013    Past Surgical History  Procedure Laterality Date  . Cesarean section      Family History  Problem Relation Age of Onset  . Hypothyroidism Maternal Aunt   . Hypertension Maternal Grandfather   . Hypertension Paternal Grandmother   . Cancer Paternal Grandmother   . Hyperlipidemia Paternal Grandmother   . Lupus Other   . Hypertension Father   . Cancer Sister     skin  . Cancer Maternal Grandmother   . Rheum arthritis Mother   . Lupus Mother     History   Social History  . Marital Status: Married    Spouse Name: N/A  . Number of Children: 2  . Years of Education: 14   Occupational History  . Customer Service    Social History Main Topics  . Smoking status: Never Smoker   . Smokeless tobacco: Never Used  . Alcohol Use: No  . Drug Use: No  . Sexual Activity: Yes    Birth Control/ Protection: None   Other Topics Concern  . Not on file   Social History Narrative   Born in [place], [place] and raised in [place]. Currently  resides in an apartment with her husband and 2 children. No pets. Fun: Work, sleep   Denies religious beliefs that would effect health care.      Review of Systems  Constitutional: Negative for fever and chills.  Respiratory: Negative for chest tightness, shortness of breath and wheezing.   Cardiovascular: Negative for chest pain, palpitations and leg swelling.  Neurological: Positive for dizziness and light-headedness.      Objective:    BP 142/94 mmHg  Pulse 80  Temp(Src) 97.5 F (36.4 C) (Oral)  Resp 18  Ht 5\' 3"  (1.6 m)  Wt 216 lb 12.8 oz (98.34 kg)  BMI 38.41 kg/m2  SpO2 99%  LMP 01/11/2014 Nursing note and vital signs reviewed.  Physical Exam  Constitutional: She is oriented to person, place, and time. She appears  well-developed and well-nourished. No distress.  HENT:  Right Ear: Hearing, tympanic membrane, external ear and ear canal normal.  Left Ear: Hearing, tympanic membrane, external ear and ear canal normal.  Mouth/Throat: Uvula is midline, oropharynx is clear and moist and mucous membranes are normal.  Neck: Neck supple.  Cardiovascular: Normal rate, regular rhythm, normal heart sounds and intact distal pulses.   Pulmonary/Chest: Effort normal and breath sounds normal.  Neurological: She is alert and oriented to person, place, and time. No cranial nerve deficit. She exhibits normal muscle tone. Coordination normal.  Skin: Skin is warm and dry.  Psychiatric: She has a normal mood and affect. Her behavior is normal. Judgment and thought content normal.       Assessment & Plan:

## 2014-02-19 NOTE — Patient Instructions (Signed)
Thank you for choosing [facility].  Summary/Instructions:  Please start taking OTC Ferrous Sulfate (325 mg)   Please schedule a physical at your convenience.   Please stop by the lab on the basement level of the building for your blood work. Your results will be released to MyChart (or called to you) after review, usually within 72 hours after test completion. If any changes need to be made, you will be notified at that same time.  If your symptoms worsen or fail to improve, please contact our office for further instruction, or in case of emergency go directly to the emergency room at the closest medical facility.    Lupus Lupus (also called systemic lupus erythematosus, SLE) is a disorder of the body's natural defense system (immune system). In lupus, the immune system attacks various areas of the body (autoimmune disease). CAUSES The cause is unknown. However, lupus runs in families. Certain genes can make you more likely to develop lupus. It is 10 times more common in women than in men. Lupus is also more common in African Americans and Asians. Other factors also play a role, such as viruses (Epstein-Barr virus, EBV), stress, hormones, cigarette smoke, and certain drugs. SYMPTOMS Lupus can affect many parts of the body, including the joints, skin, kidneys, lungs, heart, nervous system, and blood vessels. The signs and symptoms of lupus differ from person to person. The disease can range from mild to life-threatening. Typical features of lupus include:  Butterfly-shaped rash over the face.  Arthritis involving one or more joints.  Kidney disease.  Fever, weight loss, hair loss, fatigue.  Poor circulation in the fingers and toes (Raynaud's disease).  Chest pain when taking deep breaths. Abdominal pain may also occur.  Skin rash in areas exposed to the sun.  Sores in the mouth and nose. DIAGNOSIS Diagnosing lupus can take a long time and is often difficult. An exam and  an accurate account of your symptoms and health problems is very important. Blood tests are necessary, though no single test can confirm or rule out lupus. Most people with lupus test positive for antinuclear antibodies (ANA) on a blood test. Additional blood tests, a urine test (urinalysis), and sometimes a kidney or skin tissue sample (biopsy) can help to confirm or rule out lupus. TREATMENT There is no cure for lupus. Your caregiver will develop a treatment plan based on your age, sex, health, symptoms, and lifestyle. The goals are to prevent flares, to treat them when they do occur, and to minimize organ damage and complications. How the disease may affect each person varies widely. Most people with lupus can live normal lives, but this disorder must be carefully monitored. Treatment must be adjusted as necessary to prevent serious complications. Medicines used for treatment:  Nonsteroidal anti-inflammatory drugs (NSAIDs) decrease inflammation and can help with chest pain, joint pain, and fevers. Examples include ibuprofen and naproxen.  Antimalarial drugs were designed to treat malaria. They also treat fatigue, joint pain, skin rashes, and inflammation of the lungs in patients with lupus.  Corticosteroids are powerful hormones that rapidly suppress inflammation. The lowest dose with the highest benefit will be chosen. They can be given by cream, pills, injections, and through the vein (intravenously).  Immunosuppressive drugs block the making of immune cells. They may be used for kidney or nerve disease. HOME CARE INSTRUCTIONS  Exercise. Low-impact activities can usually help keep joints flexible without being too strenuous.  Rest after periods of exercise.  Avoid excessive sun exposure.  Follow  proper nutrition and take supplements as recommended by your caregiver.  Stress management can be helpful. SEEK MEDICAL CARE IF:  You have increased fatigue.  You develop pain.  You develop  a rash.  You have an oral temperature above 102 F (38.9 C).  You develop abdominal discomfort.  You develop a headache.  You experience dizziness. FOR MORE INFORMATION National Institute of Neurological Disorders and Stroke: [employer] of Rheumatology: www.rheumatology.[facility]th[facility] and Musculoskeletal and Skin Diseases: www.niams.http://www.myers.net/ Document Released: 12/09/2001 Document Revised: 03/13/2011 Document Reviewed: 04/01/2009 [facility] Patient Information 2015 [place], [place]. This information is not intended to replace advice given to you by your health care provider. Make sure you discuss any questions you have with your health care provider.

## 2014-02-19 NOTE — Assessment & Plan Note (Signed)
Obtain ANA, Antiphospholipid panel, CRP, C3 and C4 to rule out lupus as a potential cause of symptoms.

## 2014-02-20 LAB — C3 AND C4
C3 Complement: 208 mg/dL — ABNORMAL HIGH (ref 90–180)
C4 Complement: 39 mg/dL (ref 10–40)

## 2014-02-20 LAB — ANA: Anti Nuclear Antibody(ANA): NEGATIVE

## 2014-02-21 ENCOUNTER — Encounter: Payer: Self-pay | Admitting: Family

## 2014-02-24 LAB — ANTIPHOSPHOLOPID AB PANEL
Anticardiolipin IgA: 3 APL U/mL (ref <22)
Anticardiolipin IgG: 10 GPL U/mL (ref <23)
Anticardiolipin IgM: 3 MPL U/mL (ref <11)
Beta-2 Glyco I IgG: 7 G Units (ref <20)
Beta-2-Glycoprotein I IgA: 3 A Units (ref <20)
Beta-2-Glycoprotein I IgM: 7 M Units (ref <20)
Phosphatidylserine IgG Autoantibodies: 7 U/mL (ref <16)
Phosphatydalserine, IgA: 6 U/mL (ref <20)
Phosphatydalserine, IgM: 15 U/mL (ref <22)

## 2014-05-21 ENCOUNTER — Ambulatory Visit: Payer: 59 | Admitting: Obstetrics

## 2014-11-04 ENCOUNTER — Other Ambulatory Visit: Payer: Self-pay | Admitting: Emergency Medicine

## 2014-11-23 ENCOUNTER — Other Ambulatory Visit: Payer: Self-pay | Admitting: Emergency Medicine

## 2014-11-23 ENCOUNTER — Ambulatory Visit (INDEPENDENT_AMBULATORY_CARE_PROVIDER_SITE_OTHER): Payer: 59 | Admitting: Family Medicine

## 2014-11-23 VITALS — BP 110/80 | HR 71 | Temp 97.8°F | Resp 18 | Ht 63.0 in | Wt 219.4 lb

## 2014-11-23 DIAGNOSIS — J4520 Mild intermittent asthma, uncomplicated: Secondary | ICD-10-CM

## 2014-11-23 MED ORDER — FLUTICASONE PROPIONATE HFA 110 MCG/ACT IN AERO: 1.0000 | INHALATION_SPRAY | Freq: Two times a day (BID) | RESPIRATORY_TRACT | Status: DC

## 2014-11-23 MED ORDER — ALBUTEROL SULFATE HFA 108 (90 BASE) MCG/ACT IN AERS: 2.0000 | INHALATION_SPRAY | RESPIRATORY_TRACT | Status: DC | PRN

## 2014-11-23 NOTE — Progress Notes (Signed)
Patient ID: Debra Horn, female    DOB: 04-28-1985  Age: 29 y.o. MRN: [id number]  Chief Complaint  Patient presents with  . OTHER    Meds refill , albuterol    Subjective:   Patient is here for refills on her asthma medicines. She uses the albuterol inhaler every couple of days when she has a little tightening up. She says that dust is probably her biggest problem. She otherwise is doing well. 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  The patient has not gotten a flu shot, but declines willingness to get one.  Current allergies, medications, problem list, past/family and social histories reviewed.  Objective:  BP 110/80 mmHg  Pulse 71  Temp(Src) 97.8 F (36.6 C) (Oral)  Resp 18  Ht 5\' 3"  (1.6 m)  Wt 219 lb 6.4 oz (99.519 kg)  BMI 38.87 kg/m2  SpO2 95%  LMP 10/05/2014  No acute distress. Throat clear. Neck supple without nodes or thyromegaly. Chest is clear to auscultation. Heart regular without murmurs or gallops or arrhythmias    Assessment & Plan:   Assessment: No diagnosis found.    Plan: Refill her medications. Return in one year, sooner if needed  Urged her to reconsider getting       There are no Patient Instructions on file for this visit.   No Follow-up on file.   Harvey Lingo, MD 11/23/2014

## 2014-11-23 NOTE — Patient Instructions (Addendum)
Continue inhalers  Return if needed.   If you decide to get the flu shot you can come by at anytime and get it.

## 2015-04-02 ENCOUNTER — Ambulatory Visit (INDEPENDENT_AMBULATORY_CARE_PROVIDER_SITE_OTHER): Payer: 59 | Admitting: Family Medicine

## 2015-04-02 VITALS — BP 134/77 | HR 88 | Temp 98.3°F | Resp 16 | Ht 64.5 in | Wt 220.0 lb

## 2015-04-02 DIAGNOSIS — E669 Obesity, unspecified: Secondary | ICD-10-CM

## 2015-04-02 DIAGNOSIS — J4540 Moderate persistent asthma, uncomplicated: Secondary | ICD-10-CM

## 2015-04-02 MED ORDER — ALBUTEROL SULFATE HFA 108 (90 BASE) MCG/ACT IN AERS: 2.0000 | INHALATION_SPRAY | RESPIRATORY_TRACT | Status: DC | PRN

## 2015-04-02 MED ORDER — FLUTICASONE PROPIONATE HFA 110 MCG/ACT IN AERO: INHALATION_SPRAY | RESPIRATORY_TRACT | Status: DC

## 2015-04-02 MED ORDER — PREDNISONE 20 MG PO TABS: ORAL_TABLET | ORAL | Status: DC

## 2015-04-02 MED ORDER — MONTELUKAST SODIUM 10 MG PO TABS: 10.0000 mg | ORAL_TABLET | Freq: Every day | ORAL | Status: DC

## 2015-04-02 NOTE — Patient Instructions (Addendum)
Work on weight loss as discussed  Use the Flovent 110 2 inhalations twice daily  Use the Proventil inhaler on an as-needed basis as directed (albuterol)  Take the tapered dose prednisone 3 daily for 2 days, then 2 daily for 2 days, then 1 daily for 2 days  Drink plenty of fluids  FMLA forms have been completed that will need to be return to your workplace for when you do have to miss work.  Return in about 3 months or as needed.    IF you received an x-ray today, you will receive an invoice from [facility]. Please contact [facility] at [phone] with questions or concerns regarding your invoice.   IF you received labwork today, you will receive an invoice from [facility]. Please contact [facility] at [phone] with questions or concerns regarding your invoice.   Our billing staff will not be able to assist you with questions regarding bills from these companies.  You will be contacted with the lab results as soon as they are available. The fastest way to get your results is to activate your My Chart account. Instructions are located on the last page of this paperwork. If you have not heard from us regarding the results in 2 weeks, please contact this office.

## 2015-04-02 NOTE — Progress Notes (Signed)
Patient ID: Debra BrazenLauren A Hlavaty, female    DOB: 28-Nov-1985  Age: 30 y.o. MRN: [id number]  Chief Complaint  Patient presents with  . Shortness of Breath    x 1 mo, pt states asthma flare-up    Subjective:   30 year old lady who has a long history of asthma. She has had to use her albuterol inhaler much more frequently in recent weeks, almost using a canister in the last 2-1/2 weeks. She did have the flu a couple of weeks ago. She works at a call center. She does have problems with bringing up some thick glue-like mucus from time to time when she is on the phone. She does not smoke. She does feel like going into the call center with the odor of smokers and perfume in food and things does aggravate her symptoms. Her daughter has a nebulizer machine, and she does use that from time to time when needed. She is currently not using the Flovent, did not feel like it was helping her much. The help of the albuterol is transient. She gets winded if she exerts, such as trying a flight of stairs.  Current allergies, medications, problem list, past/family and social histories reviewed.  Objective:  BP 134/77 mmHg  Pulse 88  Temp(Src) 98.3 F (36.8 C)  Resp 16  Ht 5' 4.5" (1.638 m)  Wt 220 lb (99.791 kg)  BMI 37.19 kg/m2  No major acute distress. She had used her inhaler a little while ago. Her throat was clear. Neck supple without nodes. Chest is clear to auscultation without wheezing but decreased airflow. Heart regular without murmur. Her peak flow was 370, 400 on 2 testings with a predicted maximum in the 430 range.  Assessment & Plan:   Assessment: 1. Asthma, moderate persistent, uncomplicated   2. Obesity       Plan: We'll need to try to tune up her treatment regimen and see if we can get her opening better.  Discussed weight loss goals and techniques. She hopes to work on this.  No orders of the defined types were placed in this encounter.    Meds ordered this encounter  Medications   . fluticasone (FLOVENT HFA) 110 MCG/ACT inhaler    Sig: Use 2 puffs twice daily for asthma    Dispense:  1 Inhaler    Refill:  6  . albuterol (PROAIR HFA) 108 (90 Base) MCG/ACT inhaler    Sig: Inhale 2 puffs into the lungs every 4 (four) hours as needed.    Dispense:  8.5 Inhaler    Refill:  12  . montelukast (SINGULAIR) 10 MG tablet    Sig: Take 1 tablet (10 mg total) by mouth at bedtime.    Dispense:  30 tablet    Refill:  3  . predniSONE (DELTASONE) 20 MG tablet    Sig: Take 3 daily for 2 days, then 2 daily for 2 days, then 1 daily for 2 days for asthma    Dispense:  12 tablet    Refill:  0         Patient Instructions   Work on weight loss as discussed  Use the Flovent 110 2 inhalations twice daily  Use the Proventil inhaler on an as-needed basis as directed (albuterol)  Take the tapered dose prednisone 3 daily for 2 days, then 2 daily for 2 days, then 1 daily for 2 days  Drink plenty of fluids  FMLA forms have been completed that will need to be return to your  workplace for when you do have to miss work.  Return in about 3 months or as needed.    IF you received an x-ray today, you will receive an invoice from [facility]. Please contact [facility] at [phone] with questions or concerns regarding your invoice.   IF you received labwork today, you will receive an invoice from [facility]. Please contact [facility] at [phone] with questions or concerns regarding your invoice.   Our billing staff will not be able to assist you with questions regarding bills from these companies.  You will be contacted with the lab results as soon as they are available. The fastest way to get your results is to activate your My Chart account. Instructions are located on the last page of this paperwork. If you have not heard from [place] regarding the results in 2 weeks, please contact this office.          Return in about 3  months (around 07/02/2015).   Dat Derksen, MD 04/02/2015

## 2015-05-27 ENCOUNTER — Other Ambulatory Visit: Payer: Self-pay

## 2015-05-27 DIAGNOSIS — J4540 Moderate persistent asthma, uncomplicated: Secondary | ICD-10-CM

## 2015-05-27 MED ORDER — MONTELUKAST SODIUM 10 MG PO TABS: 10.0000 mg | ORAL_TABLET | Freq: Every day | ORAL | Status: DC

## 2015-07-16 ENCOUNTER — Emergency Department (HOSPITAL_COMMUNITY): Payer: Medicaid Other

## 2015-07-16 ENCOUNTER — Emergency Department (HOSPITAL_COMMUNITY)
Admission: EM | Admit: 2015-07-16 | Discharge: 2015-07-16 | Disposition: A | Discharge status: Home / Self Care | Payer: Medicaid Other | Attending: Emergency Medicine | Admitting: Emergency Medicine

## 2015-07-16 ENCOUNTER — Encounter (HOSPITAL_COMMUNITY): Payer: Self-pay | Admitting: Emergency Medicine

## 2015-07-16 DIAGNOSIS — Y9301 Activity, walking, marching and hiking: Secondary | ICD-10-CM | POA: Diagnosis not present

## 2015-07-16 DIAGNOSIS — W19XXXA Unspecified fall, initial encounter: Secondary | ICD-10-CM

## 2015-07-16 DIAGNOSIS — Y999 Unspecified external cause status: Secondary | ICD-10-CM | POA: Insufficient documentation

## 2015-07-16 DIAGNOSIS — S5012XA Contusion of left forearm, initial encounter: Secondary | ICD-10-CM | POA: Insufficient documentation

## 2015-07-16 DIAGNOSIS — S20219A Contusion of unspecified front wall of thorax, initial encounter: Secondary | ICD-10-CM | POA: Insufficient documentation

## 2015-07-16 DIAGNOSIS — J45909 Unspecified asthma, uncomplicated: Secondary | ICD-10-CM | POA: Diagnosis not present

## 2015-07-16 DIAGNOSIS — S79912A Unspecified injury of left hip, initial encounter: Secondary | ICD-10-CM | POA: Diagnosis present

## 2015-07-16 DIAGNOSIS — S7002XA Contusion of left hip, initial encounter: Secondary | ICD-10-CM | POA: Diagnosis not present

## 2015-07-16 DIAGNOSIS — Y92481 Parking lot as the place of occurrence of the external cause: Secondary | ICD-10-CM | POA: Diagnosis not present

## 2015-07-16 DIAGNOSIS — R52 Pain, unspecified: Secondary | ICD-10-CM

## 2015-07-16 LAB — POC URINE PREG, ED: Preg Test, Ur: NEGATIVE

## 2015-07-16 MED ORDER — TRAMADOL HCL 50 MG PO TABS: 50.0000 mg | ORAL_TABLET | Freq: Four times a day (QID) | ORAL | Status: DC | PRN

## 2015-07-16 NOTE — Discharge Instructions (Signed)
Follow-up with Dr. Eulah PontMurphy if having any problems
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
## 2015-07-16 NOTE — ED Notes (Signed)
Patient states her arm is feeling better, able to make fist and tingling sensation has subsided.

## 2015-07-16 NOTE — ED Notes (Signed)
Patient ambulating with steady gait. Waiting for POC preg before hip x-ray.

## 2015-07-16 NOTE — ED Notes (Signed)
POC pregnancy negative per Jeannett SeniorStephen in mini lab. X-Ray notified, pt ready for transport.

## 2015-09-01 ENCOUNTER — Emergency Department (HOSPITAL_COMMUNITY): Payer: Self-pay

## 2015-09-01 ENCOUNTER — Emergency Department (HOSPITAL_COMMUNITY)
Admission: EM | Admit: 2015-09-01 | Discharge: 2015-09-02 | Disposition: A | Discharge status: Home / Self Care | Payer: Self-pay | Attending: Emergency Medicine | Admitting: Emergency Medicine

## 2015-09-01 DIAGNOSIS — Z7982 Long term (current) use of aspirin: Secondary | ICD-10-CM | POA: Insufficient documentation

## 2015-09-01 DIAGNOSIS — J45909 Unspecified asthma, uncomplicated: Secondary | ICD-10-CM | POA: Insufficient documentation

## 2015-09-01 DIAGNOSIS — R0789 Other chest pain: Secondary | ICD-10-CM | POA: Insufficient documentation

## 2015-09-01 DIAGNOSIS — Z79899 Other long term (current) drug therapy: Secondary | ICD-10-CM | POA: Insufficient documentation

## 2015-09-01 DIAGNOSIS — F419 Anxiety disorder, unspecified: Secondary | ICD-10-CM

## 2015-09-01 DIAGNOSIS — M549 Dorsalgia, unspecified: Secondary | ICD-10-CM | POA: Insufficient documentation

## 2015-09-01 DIAGNOSIS — E039 Hypothyroidism, unspecified: Secondary | ICD-10-CM | POA: Insufficient documentation

## 2015-09-01 LAB — CBC
HEMATOCRIT: 33.8 % — AB (ref 36.0–46.0)
Hemoglobin: 10.6 g/dL — ABNORMAL LOW (ref 12.0–15.0)
MCH: 26.4 pg (ref 26.0–34.0)
MCHC: 31.4 g/dL (ref 30.0–36.0)
MCV: 84.1 fL (ref 78.0–100.0)
Platelets: 507 10*3/uL — ABNORMAL HIGH (ref 150–400)
RBC: 4.02 MIL/uL (ref 3.87–5.11)
RDW: 15.5 % (ref 11.5–15.5)
WBC: 8.4 10*3/uL (ref 4.0–10.5)

## 2015-09-01 NOTE — ED Provider Notes (Signed)
[facility]-EMERGENCY DEPT Provider Note   CSN: [id number] Arrival date & time: 09/01/15  2248  By signing my name below, I, Majel Homer, attest that this documentation has been prepared under the direction and in the presence of Nimsi Males, MD . Electronically Signed: Majel Homer, Scribe. 09/01/2015. 11:38 PM.  History   Chief Complaint Chief Complaint  Patient presents with  . Chest Pain   The history is provided by the patient. No language interpreter was used.  Chest Pain   This is a recurrent problem. The current episode started 12 to 24 hours ago. The problem has been gradually worsening. The pain radiates to the upper back. Associated symptoms include back pain. Pertinent negatives include no cough, no shortness of breath and no vomiting.  Her past medical history is significant for PE.   HPI Comments: MAKEISHA JENTSCH is a 30 y.o. female with PMHx of PE, who presents to the Emergency Department complaining of gradually worsening, "sharp," right sided chest pain that began at ~12:00 PM this afternoon. She notes her pain radiates into her back; she describes her pain as going through her chest into her back. She states her pain is not associated with position or movement. She notes associated congestion. She states she has had similar pain in the past in which she had a PE. She notes she is not currently on blood thinners or contraceptives; she states her PE occurred 2 days after beginning contraceptives. She reports she took aspirin and used a heating pad with no relief today. She denies leg swelling, recent long travel, gas, vomiting, cough and shortness of breath.     Past Medical History:  Diagnosis Date  . Anxiety   . Asthma   . Hyperthyroidism   . PE (pulmonary embolism) 11/2011  . Preeclampsia   . Preeclampsia   . Pregnancy induced hypertension   . Preterm labor   . Pulmonary embolism [facility]) Nov 2013    Patient Active Problem List   Diagnosis Date Noted  . Dizziness  02/19/2014  . Family history of systemic lupus erythematosus (SLE) in mother 02/19/2014  . Asthma 02/19/2014  . Hyperthyroidism 02/19/2014  . Preterm NB deliv by C-section, 1,750-1,999 gm, 31-32 completed weeks 08/08/2012  . Previous cesarean section 08/08/2012  . Hx pulmonary embolism 07/24/2012  . Supervision of high-risk pregnancy 07/24/2012    Past Surgical History:  Procedure Laterality Date  . CESAREAN SECTION      OB History    Gravida Para Term Preterm AB Living   2 1   1   1    SAB TAB Ectopic Multiple Live Births           1       Home Medications    Prior to Admission medications   Medication Sig Start Date End Date Taking? Authorizing Provider  albuterol (PROAIR HFA) 108 (90 Base) MCG/ACT inhaler Inhale 2 puffs into the lungs every 4 (four) hours as needed. 04/02/15   Peyton Najjar, MD  fluticasone (FLOVENT HFA) 110 MCG/ACT inhaler Use 2 puffs twice daily for asthma 04/02/15   Peyton Najjar, MD  montelukast (SINGULAIR) 10 MG tablet Take 1 tablet (10 mg total) by mouth at bedtime. 05/27/15   Wallis Bamberg, PA-C  predniSONE (DELTASONE) 20 MG tablet Take 3 daily for 2 days, then 2 daily for 2 days, then 1 daily for 2 days for asthma 04/02/15   Peyton Najjar, MD  traMADol (ULTRAM) 50 MG tablet Take 1 tablet (50 mg  total) by mouth every 6 (six) hours as needed. 07/16/15   Bethann Berkshire, MD    Family History Family History  Problem Relation Age of Onset  . Hypothyroidism Maternal Aunt   . Hypertension Maternal Grandfather   . Hypertension Paternal Grandmother   . Cancer Paternal Grandmother   . Hyperlipidemia Paternal Grandmother   . Lupus Other   . Hypertension Father   . Cancer Sister     skin  . Cancer Maternal Grandmother   . Rheum arthritis Mother   . Lupus Mother     Social History Social History  Substance Use Topics  . Smoking status: Never Smoker  . Smokeless tobacco: Never Used  . Alcohol use No     Allergies   Banana and Cefixime   Review of  Systems Review of Systems  HENT: Positive for congestion.   Respiratory: Negative for cough and shortness of breath.   Cardiovascular: Positive for chest pain. Negative for leg swelling.  Gastrointestinal: Negative for vomiting.  Musculoskeletal: Positive for back pain.  All other systems reviewed and are negative.  Physical Exam Updated Vital Signs BP 122/78 (BP Location: Left Arm)   Pulse 77   Temp 99.1 F (37.3 C) (Oral)   Resp 15   LMP 08/25/2015 (Approximate)   SpO2 95%   Physical Exam  Constitutional: She appears well-developed and well-nourished.  HENT:  Head: Normocephalic.  Mouth/Throat: Oropharynx is clear and moist. No oropharyngeal exudate.  Eyes: Conjunctivae and EOM are normal. Pupils are equal, round, and reactive to light. Right eye exhibits no discharge. Left eye exhibits no discharge. No scleral icterus.  Neck: Normal range of motion. Neck supple. No JVD present. No tracheal deviation present.  Trachea is midline. No stridor or carotid bruits.   Cardiovascular: Normal rate, regular rhythm, normal heart sounds and intact distal pulses.   No murmur heard. Pulmonary/Chest: Effort normal and breath sounds normal. No stridor. No respiratory distress. She has no wheezes. She has no rales.  Lungs CTA bilaterally.  Abdominal: Soft. Bowel sounds are normal. She exhibits no distension. There is no tenderness. There is no rebound and no guarding.  Musculoskeletal: Normal range of motion. She exhibits no edema or tenderness.  Lymphadenopathy:    She has no cervical adenopathy.  Neurological: She is alert. She has normal reflexes. She displays normal reflexes. She exhibits normal muscle tone.  Skin: Skin is warm and dry. Capillary refill takes less than 2 seconds.  Psychiatric: She has a normal mood and affect. Her behavior is normal.  Nursing note and vitals reviewed.    ED Treatments / Results  Labs (all labs ordered are listed, but only abnormal results are  displayed) Labs Reviewed  BASIC METABOLIC PANEL  CBC  I-STAT TROPOININ, ED    EKG  EKG Interpretation None       Radiology Dg Chest 2 View  Result Date: 09/01/2015 CLINICAL DATA:  Right-sided chest pain and back pain beginning tonight. Prior history of pulmonary embolus. History of asthma, hypertension. Nonsmoker. EXAM: CHEST  2 VIEW COMPARISON:  07/16/2015 FINDINGS: The heart size and mediastinal contours are within normal limits. Both lungs are clear. The visualized skeletal structures are unremarkable. IMPRESSION: No active cardiopulmonary disease. Electronically Signed   By: Burman Nieves M.D.   On: 09/01/2015 23:23   Ct Angio Chest Pe W And/or Wo Contrast  Result Date: 09/02/2015 CLINICAL DATA:  Lambert Mody right-sided chest pain, onset noon yesterday. EXAM: CT ANGIOGRAPHY CHEST WITH CONTRAST TECHNIQUE: Multidetector CT imaging of the  chest was performed using the standard protocol during bolus administration of intravenous contrast. Multiplanar CT image reconstructions and MIPs were obtained to evaluate the vascular anatomy. CONTRAST:  100 mL Isovue 370 intravenous COMPARISON:  05/18/2013 FINDINGS: Cardiovascular: There is good opacification of the pulmonary arteries. There is no pulmonary embolism. The thoracic aorta is normal in caliber and intact. Lungs: Clear Central airways: Patent and normal in caliber Effusions: None Lymphadenopathy: None Esophagus: Unremarkable Upper abdomen: No significant abnormality Musculoskeletal: No significant skeletal lesion Review of the MIP images confirms the above findings. IMPRESSION: No significant abnormality. Electronically Signed   By: Ellery Plunkaniel R Mitchell M.D.   On: 09/02/2015 02:21   Procedures Procedures  DIAGNOSTIC STUDIES:  Oxygen Saturation is 95% on RA, normal by my interpretation.    COORDINATION OF CARE:  11:31 PM Discussed treatment plan with pt at bedside and pt agreed to plan.  Medications Ordered in ED Medications - No data to  display   Initial Impression / Assessment and Plan / ED Course  I have reviewed the triage vital signs and the nursing notes.  Pertinent labs & imaging results that were available during my care of the patient were reviewed by me and considered in my medical decision making (see chart for details).  Clinical Course   Vitals:   09/02/15 0113 09/02/15 0200  BP: 123/71 133/67  Pulse: 70 (!) 55  Resp: 16 13  Temp:     Results for orders placed or performed during the hospital encounter of 09/01/15  Basic metabolic panel  Result Value Ref Range   Sodium 138 135 - 145 mmol/L   Potassium 3.6 3.5 - 5.1 mmol/L   Chloride 105 101 - 111 mmol/L   CO2 28 22 - 32 mmol/L   Glucose, Bld 89 65 - 99 mg/dL   BUN 11 6 - 20 mg/dL   Creatinine, Ser 7.820.64 0.44 - 1.00 mg/dL   Calcium 9.6 8.9 - 95.610.3 mg/dL   GFR calc non Af Amer >60 >60 mL/min   GFR calc Af Amer >60 >60 mL/min   Anion gap 5 5 - 15  CBC  Result Value Ref Range   WBC 8.4 4.0 - 10.5 K/uL   RBC 4.02 3.87 - 5.11 MIL/uL   Hemoglobin 10.6 (L) 12.0 - 15.0 g/dL   HCT 21.333.8 (L) 08.636.0 - 57.846.0 %   MCV 84.1 78.0 - 100.0 fL   MCH 26.4 26.0 - 34.0 pg   MCHC 31.4 30.0 - 36.0 g/dL   RDW 46.915.5 62.911.5 - 52.815.5 %   Platelets 507 (H) 150 - 400 K/uL  I-stat troponin, ED  Result Value Ref Range   Troponin i, poc 0.00 0.00 - 0.08 ng/mL   Comment 3          I-Stat Beta hCG blood, ED ([facility], [facility], [facility] only)  Result Value Ref Range   I-stat hCG, quantitative <5.0 <5 mIU/mL   Comment 3           Dg Chest 2 View  Result Date: 09/01/2015 CLINICAL DATA:  Right-sided chest pain and back pain beginning tonight. Prior history of pulmonary embolus. History of asthma, hypertension. Nonsmoker. EXAM: CHEST  2 VIEW COMPARISON:  07/16/2015 FINDINGS: The heart size and mediastinal contours are within normal limits. Both lungs are clear. The visualized skeletal structures are unremarkable. IMPRESSION: No active cardiopulmonary disease. Electronically Signed   By: Burman NievesWilliam   Stevens M.D.   On: 09/01/2015 23:23   Ct Angio Chest Pe W And/or Wo Contrast  Result Date: 09/02/2015 CLINICAL DATA:  Lambert Mody right-sided chest pain, onset noon yesterday. EXAM: CT ANGIOGRAPHY CHEST WITH CONTRAST TECHNIQUE: Multidetector CT imaging of the chest was performed using the standard protocol during bolus administration of intravenous contrast. Multiplanar CT image reconstructions and MIPs were obtained to evaluate the vascular anatomy. CONTRAST:  100 mL Isovue 370 intravenous COMPARISON:  05/18/2013 FINDINGS: Cardiovascular: There is good opacification of the pulmonary arteries. There is no pulmonary embolism. The thoracic aorta is normal in caliber and intact. Lungs: Clear Central airways: Patent and normal in caliber Effusions: None Lymphadenopathy: None Esophagus: Unremarkable Upper abdomen: No significant abnormality Musculoskeletal: No significant skeletal lesion Review of the MIP images confirms the above findings. IMPRESSION: No significant abnormality. Electronically Signed   By: Ellery Plunk M.D.   On: 09/02/2015 02:21   Medications  iopamidol (ISOVUE-370) 76 % injection 100 mL (100 mLs Intravenous Contrast Given 09/02/15 0205)  ketorolac (TORADOL) 30 MG/ML injection 30 mg (30 mg Intravenous Given 09/02/15 0250)    All questions answered to patient's satisfaction. Based on history and exam patient has been appropriately medically screened and emergency conditions excluded. Patient is stable for discharge at this time. Follow up with your PMD for recheck in 2 days and strict return precautions given.  I personally performed the services described in this documentation, which was scribed in my presence. The recorded information has been reviewed and is accurate.   Final Clinical Impressions(s) / ED Diagnoses   Final diagnoses:  None    New Prescriptions New Prescriptions   No medications on file     Mikeila Burgen, MD 09/08/15 2319

## 2015-09-01 NOTE — ED Triage Notes (Signed)
Pt states that she started having R sided chest pain and back pain tonight. Hx of PE 3 years ago. Feels similar but she does not feel SOB. Alert and oriented.

## 2015-09-02 ENCOUNTER — Emergency Department (HOSPITAL_COMMUNITY): Payer: Self-pay

## 2015-09-02 ENCOUNTER — Encounter (HOSPITAL_COMMUNITY): Payer: Self-pay

## 2015-09-02 LAB — I-STAT BETA HCG BLOOD, ED (MC, WL, AP ONLY)

## 2015-09-02 LAB — BASIC METABOLIC PANEL
ANION GAP: 5 (ref 5–15)
BUN: 11 mg/dL (ref 6–20)
CALCIUM: 9.6 mg/dL (ref 8.9–10.3)
CO2: 28 mmol/L (ref 22–32)
Chloride: 105 mmol/L (ref 101–111)
Creatinine, Ser: 0.64 mg/dL (ref 0.44–1.00)
GFR calc Af Amer: >60 mL/min (ref >60)
GLUCOSE: 89 mg/dL (ref 65–99)
Potassium: 3.6 mmol/L (ref 3.5–5.1)
Sodium: 138 mmol/L (ref 135–145)

## 2015-09-02 LAB — I-STAT TROPONIN, ED: Troponin i, poc: 0 ng/mL (ref 0.00–0.08)

## 2015-09-02 MED ORDER — DICLOFENAC SODIUM ER 100 MG PO TB24: 100.0000 mg | ORAL_TABLET | Freq: Every day | ORAL | 0 refills | Status: DC

## 2015-09-02 MED ORDER — KETOROLAC TROMETHAMINE 30 MG/ML IJ SOLN
30.0000 mg | Freq: Once | INTRAMUSCULAR | Status: AC
  Administered 2015-09-02: 30 mg via INTRAVENOUS
  Filled 2015-09-02: qty 1

## 2015-09-02 MED ORDER — IOPAMIDOL (ISOVUE-370) INJECTION 76%
100.0000 mL | Freq: Once | INTRAVENOUS | Status: AC | PRN
  Administered 2015-09-02: 100 mL via INTRAVENOUS

## 2015-09-02 NOTE — ED Provider Notes (Signed)
[facility]-EMERGENCY DEPT Provider Note   CSN: [id number] Arrival date & time: 09/01/15  2248     History   Chief Complaint Chief Complaint  Patient presents with  . Chest Pain    HPI THERSEA MANFREDONIA is a 30 y.o. female.  HPI  Past Medical History:  Diagnosis Date  . Anxiety   . Asthma   . Hyperthyroidism   . PE (pulmonary embolism) 11/2011  . Preeclampsia   . Preeclampsia   . Pregnancy induced hypertension   . Preterm labor   . Pulmonary embolism [facility]) Nov 2013    Patient Active Problem List   Diagnosis Date Noted  . Dizziness 02/19/2014  . Family history of systemic lupus erythematosus (SLE) in mother 02/19/2014  . Asthma 02/19/2014  . Hyperthyroidism 02/19/2014  . Preterm NB deliv by C-section, 1,750-1,999 gm, 31-32 completed weeks 08/08/2012  . Previous cesarean section 08/08/2012  . Hx pulmonary embolism 07/24/2012  . Supervision of high-risk pregnancy 07/24/2012    Past Surgical History:  Procedure Laterality Date  . CESAREAN SECTION      OB History    Gravida Para Term Preterm AB Living   2 1   1   1    SAB TAB Ectopic Multiple Live Births           1       Home Medications    Prior to Admission medications   Medication Sig Start Date End Date Taking? Authorizing Provider  albuterol (PROAIR HFA) 108 (90 Base) MCG/ACT inhaler Inhale 2 puffs into the lungs every 4 (four) hours as needed. 04/02/15  Yes Peyton Najjar, MD  aspirin 325 MG tablet Take 650 mg by mouth once.   Yes Historical Provider, MD  fluticasone (FLOVENT HFA) 110 MCG/ACT inhaler Use 2 puffs twice daily for asthma Patient not taking: Reported on 09/01/2015 04/02/15   Peyton Najjar, MD  montelukast (SINGULAIR) 10 MG tablet Take 1 tablet (10 mg total) by mouth at bedtime. Patient not taking: Reported on 09/02/2015 05/27/15   Wallis Bamberg, PA-C  predniSONE (DELTASONE) 20 MG tablet Take 3 daily for 2 days, then 2 daily for 2 days, then 1 daily for 2 days for asthma Patient not taking: Reported  on 09/02/2015 04/02/15   Peyton Najjar, MD  traMADol (ULTRAM) 50 MG tablet Take 1 tablet (50 mg total) by mouth every 6 (six) hours as needed. Patient not taking: Reported on 09/02/2015 07/16/15   Bethann Berkshire, MD    Family History Family History  Problem Relation Age of Onset  . Hypothyroidism Maternal Aunt   . Hypertension Maternal Grandfather   . Hypertension Paternal Grandmother   . Cancer Paternal Grandmother   . Hyperlipidemia Paternal Grandmother   . Lupus Other   . Hypertension Father   . Cancer Sister     skin  . Cancer Maternal Grandmother   . Rheum arthritis Mother   . Lupus Mother     Social History Social History  Substance Use Topics  . Smoking status: Never Smoker  . Smokeless tobacco: Never Used  . Alcohol use No     Allergies   Banana and Cefixime   Review of Systems Review of Systems   Physical Exam Updated Vital Signs BP 133/67   Pulse (!) 55   Temp 99.1 F (37.3 C) (Oral)   Resp 13   LMP 08/25/2015 (Approximate) Comment: negative beta HCG 09/02/15  SpO2 99%   Physical Exam   ED Treatments / Results  Labs (  all labs ordered are listed, but only abnormal results are displayed) Labs Reviewed  CBC - Abnormal; Notable for the following:       Result Value   Hemoglobin 10.6 (*)    HCT 33.8 (*)    Platelets 507 (*)    All other components within normal limits  BASIC METABOLIC PANEL  I-STAT TROPOININ, ED  I-STAT BETA HCG BLOOD, ED ([facility], [facility], [facility] ONLY)    EKG  EKG Interpretation  Date/Time:  Wednesday September 01 2015 22:56:03 EDT Ventricular Rate:  69 PR Interval:    QRS Duration: 83 QT Interval:  381 QTC Calculation: 409 R Axis:   65 Text Interpretation:  Sinus rhythm Confirmed by [facility]  MD, Lillar Bianca [id number]) on 09/01/2015 11:05:54 PM       Radiology Dg Chest 2 View  Result Date: 09/01/2015 CLINICAL DATA:  Right-sided chest pain and back pain beginning tonight. Prior history of pulmonary embolus. History of asthma,  hypertension. Nonsmoker. EXAM: CHEST  2 VIEW COMPARISON:  07/16/2015 FINDINGS: The heart size and mediastinal contours are within normal limits. Both lungs are clear. The visualized skeletal structures are unremarkable. IMPRESSION: No active cardiopulmonary disease. Electronically Signed   By: Burman NievesWilliam  Stevens M.D.   On: 09/01/2015 23:23   Ct Angio Chest Pe W And/or Wo Contrast  Result Date: 09/02/2015 CLINICAL DATA:  Lambert ModySharp right-sided chest pain, onset noon yesterday. EXAM: CT ANGIOGRAPHY CHEST WITH CONTRAST TECHNIQUE: Multidetector CT imaging of the chest was performed using the standard protocol during bolus administration of intravenous contrast. Multiplanar CT image reconstructions and MIPs were obtained to evaluate the vascular anatomy. CONTRAST:  100 mL Isovue 370 intravenous COMPARISON:  05/18/2013 FINDINGS: Cardiovascular: There is good opacification of the pulmonary arteries. There is no pulmonary embolism. The thoracic aorta is normal in caliber and intact. Lungs: Clear Central airways: Patent and normal in caliber Effusions: None Lymphadenopathy: None Esophagus: Unremarkable Upper abdomen: No significant abnormality Musculoskeletal: No significant skeletal lesion Review of the MIP images confirms the above findings. IMPRESSION: No significant abnormality. Electronically Signed   By: Ellery Plunkaniel R Mitchell M.D.   On: 09/02/2015 02:21    Procedures Procedures (including critical care time) Medications  ketorolac (TORADOL) 30 MG/ML injection 30 mg (not administered)  iopamidol (ISOVUE-370) 76 % injection 100 mL (100 mLs Intravenous Contrast Given 09/02/15 0205)     Initial Impression / Assessment and Plan / ED Course  I have reviewed the triage vital signs and the nursing notes.  Pertinent labs & imaging results that were available during my care of the patient were reviewed by me and considered in my medical decision making (see chart for details).  HEART score is 1 and patient is low risk.   Negative CT for PE. I suspect this is an Muscular problem.  All questions answered to patient's satisfaction. Based on history and exam patient has been appropriately medically screened and emergency conditions excluded. Patient is stable for discharge at this time. Follow up with your PMD for recheck in 2 days and strict return precautions given.  NSAIDs and heat to the affected area   Final Clinical Impressions(s) / ED Diagnoses   Final diagnoses:  None    New Prescriptions New Prescriptions   No medications on file     Seann Genther, MD 09/02/15 [phone]

## 2015-09-02 NOTE — ED Notes (Signed)
Pt assisted o restroom

## 2015-09-02 NOTE — ED Notes (Signed)
MD at bedside. 

## 2015-09-08 ENCOUNTER — Encounter (HOSPITAL_COMMUNITY): Payer: Self-pay | Admitting: Emergency Medicine

## 2015-09-28 ENCOUNTER — Other Ambulatory Visit: Payer: Self-pay

## 2015-09-28 MED ORDER — ALBUTEROL SULFATE HFA 108 (90 BASE) MCG/ACT IN AERS: 2.0000 | INHALATION_SPRAY | RESPIRATORY_TRACT | 5 refills | Status: DC | PRN

## 2015-10-20 ENCOUNTER — Emergency Department (HOSPITAL_COMMUNITY)
Admission: EM | Admit: 2015-10-20 | Discharge: 2015-10-21 | Disposition: A | Discharge status: Home / Self Care | Payer: Medicaid Other | Attending: Emergency Medicine | Admitting: Emergency Medicine

## 2015-10-20 ENCOUNTER — Emergency Department (HOSPITAL_COMMUNITY): Payer: Medicaid Other

## 2015-10-20 DIAGNOSIS — W010XXA Fall on same level from slipping, tripping and stumbling without subsequent striking against object, initial encounter: Secondary | ICD-10-CM | POA: Diagnosis not present

## 2015-10-20 DIAGNOSIS — J45909 Unspecified asthma, uncomplicated: Secondary | ICD-10-CM | POA: Insufficient documentation

## 2015-10-20 DIAGNOSIS — Z79899 Other long term (current) drug therapy: Secondary | ICD-10-CM | POA: Insufficient documentation

## 2015-10-20 DIAGNOSIS — IMO0001 Reserved for inherently not codable concepts without codable children: Secondary | ICD-10-CM

## 2015-10-20 DIAGNOSIS — Z7982 Long term (current) use of aspirin: Secondary | ICD-10-CM | POA: Insufficient documentation

## 2015-10-20 DIAGNOSIS — Y999 Unspecified external cause status: Secondary | ICD-10-CM | POA: Diagnosis not present

## 2015-10-20 DIAGNOSIS — S9304XA Dislocation of right ankle joint, initial encounter: Secondary | ICD-10-CM

## 2015-10-20 DIAGNOSIS — Z9889 Other specified postprocedural states: Secondary | ICD-10-CM

## 2015-10-20 DIAGNOSIS — S82831A Other fracture of upper and lower end of right fibula, initial encounter for closed fracture: Secondary | ICD-10-CM | POA: Diagnosis not present

## 2015-10-20 DIAGNOSIS — Y9389 Activity, other specified: Secondary | ICD-10-CM | POA: Insufficient documentation

## 2015-10-20 DIAGNOSIS — Y92009 Unspecified place in unspecified non-institutional (private) residence as the place of occurrence of the external cause: Secondary | ICD-10-CM | POA: Diagnosis not present

## 2015-10-20 DIAGNOSIS — S99911A Unspecified injury of right ankle, initial encounter: Secondary | ICD-10-CM | POA: Diagnosis present

## 2015-10-20 MED ORDER — MORPHINE SULFATE (PF) 4 MG/ML IV SOLN
4.0000 mg | Freq: Once | INTRAVENOUS | Status: AC
  Administered 2015-10-20: 4 mg via INTRAVENOUS
  Filled 2015-10-20: qty 1

## 2015-10-20 MED ORDER — PROPOFOL 10 MG/ML IV BOLUS
INTRAVENOUS | Status: AC | PRN
  Administered 2015-10-20: 100 mg via INTRAVENOUS

## 2015-10-20 MED ORDER — HYDROMORPHONE HCL 1 MG/ML IJ SOLN
1.0000 mg | Freq: Once | INTRAMUSCULAR | Status: AC
  Administered 2015-10-20: 1 mg via INTRAVENOUS
  Filled 2015-10-20: qty 1

## 2015-10-20 MED ORDER — PROPOFOL 10 MG/ML IV BOLUS
100.0000 mg | Freq: Once | INTRAVENOUS | Status: AC
  Administered 2015-10-20: 100 mg via INTRAVENOUS
  Filled 2015-10-20: qty 20

## 2015-10-20 MED ORDER — DIPHENHYDRAMINE HCL 50 MG/ML IJ SOLN
25.0000 mg | Freq: Once | INTRAMUSCULAR | Status: AC
  Administered 2015-10-20: 25 mg via INTRAVENOUS
  Filled 2015-10-20: qty 1

## 2015-10-20 MED ORDER — IBUPROFEN 600 MG PO TABS: 600.0000 mg | ORAL_TABLET | Freq: Four times a day (QID) | ORAL | 0 refills | Status: DC | PRN

## 2015-10-20 MED ORDER — OXYCODONE HCL 5 MG PO TABS: 5.0000 mg | ORAL_TABLET | ORAL | 0 refills | Status: DC | PRN

## 2015-10-20 NOTE — ED Triage Notes (Addendum)
Pt states that she tripped in the house and now has a deformity to her R ankle. Alert and oriented. of fentanyl given en route IV.

## 2015-10-20 NOTE — Discharge Instructions (Signed)
Take your medication as prescribed. I also recommend resting, elevating and applying ice for 15 minutes 3-4 times daily. Remain non-weight bearing until you follow up with orthopedics.  Call the orthopedic office listed above tomorrow morning to schedule a follow up appointment for reevaluation and further management for this week.  Please return to the Emergency Department if symptoms worsen or new onset of fever, redness, swelling, warmth, numbness, tingling.

## 2015-10-20 NOTE — ED Notes (Signed)
Bed: WA02 Expected date:  Expected time:  Means of arrival:  Comments: EMS-right ankle deformity

## 2015-10-20 NOTE — ED Provider Notes (Signed)
[facility]-EMERGENCY DEPT Provider Note   CSN: [id number] Arrival date & time: 10/20/15  1847     History   Chief Complaint Chief Complaint  Patient presents with  . Ankle Injury    HPI Debra Horn is a 30 y.o. female.  Patient is a 30 year old female with history of asthma, hyperthyroidism and PE (denies currently being on anticoagulants) who presents the ED with complaint of right ankle pain. Patient reports while she was at home she stumbled to try to avoid stepping on a child which resulted on her falling and twisting her right ankle. Patient reports having immediate pain and deformity to her right ankle. Denies head injury or LOC. Patient notes she was given pain medication by EMS and round with mild improvement of pain. Denies numbness or tingling. She reports she is unable to move her ankle. Denies any prior injury.      Past Medical History:  Diagnosis Date  . Anxiety   . Asthma   . Hyperthyroidism   . PE (pulmonary embolism) 11/2011  . Preeclampsia   . Preeclampsia   . Pregnancy induced hypertension   . Preterm labor   . Pulmonary embolism [facility]) Nov 2013    Patient Active Problem List   Diagnosis Date Noted  . Dizziness 02/19/2014  . Family history of systemic lupus erythematosus (SLE) in mother 02/19/2014  . Asthma 02/19/2014  . Hyperthyroidism 02/19/2014  . Preterm NB deliv by C-section, 1,750-1,999 gm, 31-32 completed weeks 08/08/2012  . Previous cesarean section 08/08/2012  . Hx pulmonary embolism 07/24/2012  . Supervision of high-risk pregnancy 07/24/2012    Past Surgical History:  Procedure Laterality Date  . CESAREAN SECTION      OB History    Gravida Para Term Preterm AB Living   2 1   1   1    SAB TAB Ectopic Multiple Live Births           1       Home Medications    Prior to Admission medications   Medication Sig Start Date End Date Taking? Authorizing Provider  albuterol (PROVENTIL HFA;VENTOLIN HFA) 108 (90 Base) MCG/ACT inhaler  Inhale 2 puffs into the lungs every 4 (four) hours as needed. Patient taking differently: Inhale 2 puffs into the lungs every 4 (four) hours as needed for shortness of breath.  09/28/15  Yes Morrell RiddleSarah L Weber, PA-C  aspirin 325 MG tablet Take 650 mg by mouth every 4 (four) hours as needed for mild pain.    Yes Historical Provider, MD  aspirin-acetaminophen-caffeine (EXCEDRIN MIGRAINE) 4187935434250-250-65 MG tablet Take 1 tablet by mouth every 6 (six) hours as needed for headache.   Yes Historical Provider, MD  Diclofenac Sodium CR (VOLTAREN-XR) 100 MG 24 hr tablet Take 1 tablet (100 mg total) by mouth daily. Patient not taking: Reported on 10/20/2015 09/02/15   April Palumbo, MD  fluticasone [facility](FLOVENT [facility]) 110 MCG/ACT inhaler Use 2 puffs twice daily for asthma Patient not taking: Reported on 09/01/2015 04/02/15   Peyton Najjaravid H Hopper, MD  ibuprofen (ADVIL,MOTRIN) 600 MG tablet Take 1 tablet (600 mg total) by mouth every 6 (six) hours as needed. 10/20/15   Barrett HenleNicole Elizabeth Nadeau, PA-C  montelukast (SINGULAIR) 10 MG tablet Take 1 tablet (10 mg total) by mouth at bedtime. Patient not taking: Reported on 09/02/2015 05/27/15   Wallis BambergMario Mani, PA-C  oxyCODONE (ROXICODONE) 5 MG immediate release tablet Take 1 tablet (5 mg total) by mouth every 4 (four) hours as needed for severe pain. 10/20/15  Satira Sark Nadeau, PA-C  predniSONE (DELTASONE) 20 MG tablet Take 3 daily for 2 days, then 2 daily for 2 days, then 1 daily for 2 days for asthma Patient not taking: Reported on 09/02/2015 04/02/15   Peyton Najjar, MD  traMADol (ULTRAM) 50 MG tablet Take 1 tablet (50 mg total) by mouth every 6 (six) hours as needed. Patient not taking: Reported on 09/02/2015 07/16/15   Bethann Berkshire, MD    Family History Family History  Problem Relation Age of Onset  . Hypothyroidism Maternal Aunt   . Hypertension Maternal Grandfather   . Hypertension Paternal Grandmother   . Cancer Paternal Grandmother   . Hyperlipidemia Paternal Grandmother   .  Lupus Other   . Hypertension Father   . Cancer Sister     skin  . Cancer Maternal Grandmother   . Rheum arthritis Mother   . Lupus Mother     Social History Social History  Substance Use Topics  . Smoking status: Never Smoker  . Smokeless tobacco: Never Used  . Alcohol use No     Allergies   Banana and Cefixime   Review of Systems Review of Systems  Musculoskeletal: Positive for arthralgias (right ankle).  All other systems reviewed and are negative.    Physical Exam Updated Vital Signs BP 126/78   Pulse 66   Resp 12   LMP 10/20/2015   SpO2 100%   Physical Exam  Constitutional: She is oriented to person, place, and time. She appears well-developed and well-nourished.  HENT:  Head: Normocephalic and atraumatic. Head is without raccoon's eyes, without Battle's sign, without abrasion, without contusion and without laceration.  Eyes: Conjunctivae and EOM are normal. Right eye exhibits no discharge. Left eye exhibits no discharge. No scleral icterus.  Neck: Normal range of motion. Neck supple.  Cardiovascular: Normal rate and intact distal pulses.   Pulmonary/Chest: Effort normal. No respiratory distress.  Abdominal: Soft. She exhibits no distension.  Musculoskeletal: She exhibits tenderness and deformity. She exhibits no edema.       Right ankle: She exhibits decreased range of motion and deformity. She exhibits no swelling, no ecchymosis, no laceration and normal pulse. Tenderness (diffuse). Achilles tendon exhibits pain.       Right lower leg: She exhibits no tenderness, no bony tenderness, no swelling, no edema, no deformity and no laceration.       Right foot: There is normal range of motion, no tenderness, no bony tenderness, no swelling, normal capillary refill, no crepitus, no deformity and no laceration.  Obvious deformity noted to right ankle with diffuse TTP with light palpation extending to distal 1/3 of right tib/fib. No tenderness of right foot or knee.  Sensation grossly intact. 2+ DP pulse. Cap refill <2.   Neurological: She is alert and oriented to person, place, and time.  Skin: Skin is warm and dry. Capillary refill takes less than 2 seconds.  Nursing note and vitals reviewed.    ED Treatments / Results  Labs (all labs ordered are listed, but only abnormal results are displayed) Labs Reviewed - No data to display  EKG  EKG Interpretation None       Radiology Dg Ankle Complete Right  Result Date: 10/20/2015 CLINICAL DATA:  Right ankle pain after trip and fall. Ankle deformity. EXAM: RIGHT ANKLE - COMPLETE 3+ VIEW COMPARISON:  None. FINDINGS: There is anterior dislocation of the tibial plafond relative to the talar dome on the lateral projection. There is an acute distal diaphyseal fracture of  the fibula with dorsal and lateral angulation of the distal fracture fragment. Fracture appears to extend into the ankle joint. The ankle mortise is widened along its medial aspect to 9 mm. Tiny avulsed fracture fragments are seen along the medial aspect of the talus just lateral caudal to the malleolus. Base of fifth metatarsal appears intact. Subtalar joint is maintained. IMPRESSION: Anterior dislocation of the ankle joint with widened mortise medially. Acute, closed, intra-articular fracture of the distal diaphysis of the fibula extending into the ankle joint with lateral displacement and dorsolateral angulation of the distal fracture fragment. Electronically Signed   By: Tollie Eth M.D.   On: 10/20/2015 20:39   Dg Ankle Right Port  Result Date: 10/20/2015 CLINICAL DATA:  Follow-up exam status post splinting and reduction EXAM: PORTABLE RIGHT ANKLE - 2 VIEW COMPARISON:  Prior radiograph from earlier the same day. FINDINGS: Splinting mature overlies right ankle, somewhat limiting evaluation for fine osseous detail. Previously identified oblique distal fibular fracture again seen. There is minimal residual posterior displacement. Ankle joint  canal and anatomic alignment. No new fracture. IMPRESSION: 1. Right ankle now in anatomic alignment status post splinting and reduction. 2. Distal right fibular fracture in near anatomic alignment status post reduction. Electronically Signed   By: Rise Mu M.D.   On: 10/20/2015 23:26    Procedures Reduction of dislocation Date/Time: 10/21/2015 1:52 AM Performed by: Barrett Henle Authorized by: Barrett Henle  Consent: Written consent obtained. Risks and benefits: risks, benefits and alternatives were discussed Consent given by: patient Patient understanding: patient states understanding of the procedure being performed Patient consent: the patient's understanding of the procedure matches consent given Procedure consent: procedure consent matches procedure scheduled Imaging studies: imaging studies available Patient identity confirmed: arm band  Sedation: Patient sedated: yes Sedation type: moderate (conscious) sedation Sedatives: propofol Analgesia: hydromorphone Vitals: Vital signs were monitored during sedation. Patient tolerance: Patient tolerated the procedure well with no immediate complications    (including critical care time)  Medications Ordered in ED Medications  morphine 4 MG/ML injection 4 mg (4 mg Intravenous Given 10/20/15 1945)  diphenhydrAMINE (BENADRYL) injection 25 mg (25 mg Intravenous Given 10/20/15 2005)  propofol (DIPRIVAN) 10 mg/mL bolus/IV push 100 mg (100 mg Intravenous Given 10/20/15 2248)  propofol (DIPRIVAN) 10 mg/mL bolus/IV push (100 mg Intravenous Given 10/20/15 2234)  HYDROmorphone (DILAUDID) injection 1 mg (1 mg Intravenous Given 10/20/15 2327)   SPLINT APPLICATION Date/Time: 1:56 AM Authorized by: Barrett Henle Consent: Verbal consent obtained. Risks and benefits: risks, benefits and alternatives were discussed Consent given by: patient Splint applied by: orthopedic technician Location details:  right lowe leg Splint type: short leg Post-procedure: The splinted body part was neurovascularly unchanged following the procedure. Patient tolerance: Patient tolerated the procedure well with no immediate complications.     Initial Impression / Assessment and Plan / ED Course  I have reviewed the triage vital signs and the nursing notes.  Pertinent labs & imaging results that were available during my care of the patient were reviewed by me and considered in my medical decision making (see chart for details).  Clinical Course    Patient presents with right ankle pain and deformity after falling due to tripping over a child at home. Denies head injury or LOC. VSS. Exam revealed obvious deformity to right ankle, right foot and leg neurovascularly intact. No tenderness over proximal tib-fib or right knee. Patient given pain meds. Right ankle x-ray revealed anterior dislocation of ankle joint with widened mortise  medially, closed fracture of distal fibula with lateral displacement and dorsal lateral angulation. Consulted orthopedics. Dr. Lequita Halt advised to reduce ankle in the ED, placed patient in a splint and have her follow-up in our office this week. Reduction performed without any complications. Splint applied immediately after procedure. Repeat x-ray revealed right ankle and anatomic alignment, distal right fibular fracture and near anatomic alignment status post reduction. Plan discharge patient home with crutches, pain meds and advised to call the orthopedic clinic to schedule a follow-up appointment for this week. Advised patient to remain nonweightbearing. Discussed return precautions.  Final Clinical Impressions(s) / ED Diagnoses   Final diagnoses:  Dislocation of right ankle joint, initial encounter  Other closed fracture of distal end of right fibula, initial encounter    New Prescriptions Discharge Medication List as of 10/20/2015 11:56 PM    START taking these medications    Details  ibuprofen (ADVIL,MOTRIN) 600 MG tablet Take 1 tablet (600 mg total) by mouth every 6 (six) hours as needed., Starting Wed 10/20/2015, Print    oxyCODONE (ROXICODONE) 5 MG immediate release tablet Take 1 tablet (5 mg total) by mouth every 4 (four) hours as needed for severe pain., Starting Wed 10/20/2015, Print         Satira Sark [place], [place] 10/21/15 6962    Charlynne Pander, MD 10/21/15 1200

## 2015-10-27 ENCOUNTER — Encounter (HOSPITAL_COMMUNITY): Payer: Self-pay | Admitting: *Deleted

## 2015-10-27 NOTE — Progress Notes (Signed)
Spoke with pt for pre-op call. Pt denies cardiac history, chest pain or sob. Pt states the only time that she has had high blood pressure was during her pregnancies.

## 2015-10-28 ENCOUNTER — Ambulatory Visit (HOSPITAL_COMMUNITY): Payer: PRIVATE HEALTH INSURANCE | Admitting: Anesthesiology

## 2015-10-28 ENCOUNTER — Observation Stay (HOSPITAL_COMMUNITY)
Admission: RE | Admit: 2015-10-28 | Discharge: 2015-10-29 | Disposition: A | Discharge status: Home / Self Care | Payer: PRIVATE HEALTH INSURANCE | Source: Ambulatory Visit | Attending: Orthopedic Surgery | Admitting: Orthopedic Surgery

## 2015-10-28 ENCOUNTER — Encounter (HOSPITAL_COMMUNITY)
Admission: RE | Disposition: A | Discharge status: Home / Self Care | Payer: Self-pay | Source: Ambulatory Visit | Attending: Orthopedic Surgery

## 2015-10-28 ENCOUNTER — Ambulatory Visit (HOSPITAL_COMMUNITY): Payer: PRIVATE HEALTH INSURANCE

## 2015-10-28 ENCOUNTER — Encounter (HOSPITAL_COMMUNITY): Payer: Self-pay | Admitting: Certified Registered"

## 2015-10-28 DIAGNOSIS — S8261XA Displaced fracture of lateral malleolus of right fibula, initial encounter for closed fracture: Principal | ICD-10-CM | POA: Insufficient documentation

## 2015-10-28 DIAGNOSIS — Z79899 Other long term (current) drug therapy: Secondary | ICD-10-CM | POA: Diagnosis not present

## 2015-10-28 DIAGNOSIS — J45909 Unspecified asthma, uncomplicated: Secondary | ICD-10-CM | POA: Insufficient documentation

## 2015-10-28 DIAGNOSIS — Z86711 Personal history of pulmonary embolism: Secondary | ICD-10-CM | POA: Insufficient documentation

## 2015-10-28 DIAGNOSIS — W19XXXA Unspecified fall, initial encounter: Secondary | ICD-10-CM | POA: Diagnosis not present

## 2015-10-28 DIAGNOSIS — S8263XA Displaced fracture of lateral malleolus of unspecified fibula, initial encounter for closed fracture: Secondary | ICD-10-CM | POA: Diagnosis present

## 2015-10-28 DIAGNOSIS — S89391A Other physeal fracture of lower end of right fibula, initial encounter for closed fracture: Secondary | ICD-10-CM | POA: Diagnosis not present

## 2015-10-28 DIAGNOSIS — Z419 Encounter for procedure for purposes other than remedying health state, unspecified: Secondary | ICD-10-CM

## 2015-10-28 HISTORY — DX: Gastro-esophageal reflux disease without esophagitis: K21.9

## 2015-10-28 HISTORY — PX: ORIF ANKLE FRACTURE: SHX5408

## 2015-10-28 HISTORY — PX: ORIF ANKLE FRACTURE: SUR919

## 2015-10-28 HISTORY — DX: Headache, unspecified: R51.9

## 2015-10-28 HISTORY — DX: Adverse effect of unspecified anesthetic, initial encounter: T41.45XA

## 2015-10-28 HISTORY — DX: Anemia, unspecified: D64.9

## 2015-10-28 HISTORY — DX: Other complications of anesthesia, initial encounter: T88.59XA

## 2015-10-28 HISTORY — DX: Headache: R51

## 2015-10-28 LAB — BASIC METABOLIC PANEL
Anion gap: 10 (ref 5–15)
BUN: 12 mg/dL (ref 6–20)
CALCIUM: 9.3 mg/dL (ref 8.9–10.3)
CO2: 24 mmol/L (ref 22–32)
CREATININE: 0.7 mg/dL (ref 0.44–1.00)
Chloride: 105 mmol/L (ref 101–111)
Glucose, Bld: 78 mg/dL (ref 65–99)
Potassium: 3.7 mmol/L (ref 3.5–5.1)
SODIUM: 139 mmol/L (ref 135–145)

## 2015-10-28 LAB — CBC
HEMATOCRIT: 37.6 % (ref 36.0–46.0)
Hemoglobin: 11.8 g/dL — ABNORMAL LOW (ref 12.0–15.0)
MCH: 27.1 pg (ref 26.0–34.0)
MCHC: 31.4 g/dL (ref 30.0–36.0)
MCV: 86.4 fL (ref 78.0–100.0)
PLATELETS: 441 10*3/uL — AB (ref 150–400)
RBC: 4.35 MIL/uL (ref 3.87–5.11)
RDW: 15.3 % (ref 11.5–15.5)
WBC: 5.6 10*3/uL (ref 4.0–10.5)

## 2015-10-28 LAB — HCG, SERUM, QUALITATIVE: PREG SERUM: NEGATIVE

## 2015-10-28 SURGERY — OPEN REDUCTION INTERNAL FIXATION (ORIF) ANKLE FRACTURE
Anesthesia: Regional | Site: Ankle | Laterality: Right

## 2015-10-28 MED ORDER — MIDAZOLAM HCL 2 MG/2ML IJ SOLN
INTRAMUSCULAR | Status: AC
Start: 2015-10-28 — End: 2015-10-28
  Filled 2015-10-28: qty 2

## 2015-10-28 MED ORDER — ESMOLOL HCL 100 MG/10ML IV SOLN
INTRAVENOUS | Status: DC | PRN
  Administered 2015-10-28: 15 mg via INTRAVENOUS

## 2015-10-28 MED ORDER — ROCURONIUM BROMIDE 10 MG/ML (PF) SYRINGE
PREFILLED_SYRINGE | INTRAVENOUS | Status: AC
  Filled 2015-10-28: qty 10

## 2015-10-28 MED ORDER — FENTANYL CITRATE (PF) 100 MCG/2ML IJ SOLN
INTRAMUSCULAR | Status: DC | PRN
  Administered 2015-10-28: 50 ug via INTRAVENOUS

## 2015-10-28 MED ORDER — OXYCODONE HCL 5 MG PO TABS
ORAL_TABLET | ORAL | Status: AC
  Administered 2015-10-28: 5 mg via ORAL
  Filled 2015-10-28: qty 1

## 2015-10-28 MED ORDER — ONDANSETRON HCL 4 MG/2ML IJ SOLN
INTRAMUSCULAR | Status: DC | PRN
  Administered 2015-10-28: 4 mg via INTRAVENOUS

## 2015-10-28 MED ORDER — CEFAZOLIN SODIUM-DEXTROSE 2-4 GM/100ML-% IV SOLN
INTRAVENOUS | Status: AC
  Filled 2015-10-28: qty 100

## 2015-10-28 MED ORDER — DOCUSATE SODIUM 100 MG PO CAPS
100.0000 mg | ORAL_CAPSULE | Freq: Two times a day (BID) | ORAL | Status: DC
  Administered 2015-10-28 – 2015-10-29 (×2): 100 mg via ORAL
  Filled 2015-10-28 (×2): qty 1

## 2015-10-28 MED ORDER — ALBUTEROL SULFATE (2.5 MG/3ML) 0.083% IN NEBU
2.5000 mg | INHALATION_SOLUTION | RESPIRATORY_TRACT | Status: DC | PRN
  Administered 2015-10-28: 2.5 mg via RESPIRATORY_TRACT
  Filled 2015-10-28: qty 3

## 2015-10-28 MED ORDER — MIDAZOLAM HCL 5 MG/5ML IJ SOLN
INTRAMUSCULAR | Status: DC | PRN
  Administered 2015-10-28: 2 mg via INTRAVENOUS

## 2015-10-28 MED ORDER — LACTATED RINGERS IV SOLN
INTRAVENOUS | Status: DC
  Administered 2015-10-28 (×2): via INTRAVENOUS
  Administered 2015-10-29: 50 mL/h via INTRAVENOUS

## 2015-10-28 MED ORDER — ACETAMINOPHEN 325 MG PO TABS
650.0000 mg | ORAL_TABLET | Freq: Four times a day (QID) | ORAL | Status: DC | PRN
Start: 2015-10-28 — End: 2015-10-29

## 2015-10-28 MED ORDER — PROPOFOL 10 MG/ML IV BOLUS
INTRAVENOUS | Status: AC
  Filled 2015-10-28: qty 20

## 2015-10-28 MED ORDER — KETOROLAC TROMETHAMINE 30 MG/ML IJ SOLN
INTRAMUSCULAR | Status: AC
  Filled 2015-10-28: qty 1

## 2015-10-28 MED ORDER — BUPIVACAINE-EPINEPHRINE (PF) 0.5% -1:200000 IJ SOLN
INTRAMUSCULAR | Status: DC | PRN
  Administered 2015-10-28: 30 mL via PERINEURAL

## 2015-10-28 MED ORDER — FENTANYL CITRATE (PF) 100 MCG/2ML IJ SOLN
INTRAMUSCULAR | Status: AC
  Administered 2015-10-28: 50 ug via INTRAVENOUS
  Filled 2015-10-28: qty 2

## 2015-10-28 MED ORDER — ONDANSETRON HCL 4 MG/2ML IJ SOLN
4.0000 mg | Freq: Four times a day (QID) | INTRAMUSCULAR | Status: DC | PRN
Start: 2015-10-28 — End: 2015-10-28

## 2015-10-28 MED ORDER — OXYCODONE HCL 5 MG PO TABS
5.0000 mg | ORAL_TABLET | ORAL | Status: DC | PRN
  Administered 2015-10-29: 10 mg via ORAL
  Filled 2015-10-28: qty 2

## 2015-10-28 MED ORDER — SUCCINYLCHOLINE CHLORIDE 20 MG/ML IJ SOLN
INTRAMUSCULAR | Status: DC | PRN
  Administered 2015-10-28: 100 mg via INTRAVENOUS

## 2015-10-28 MED ORDER — CEFAZOLIN SODIUM-DEXTROSE 2-4 GM/100ML-% IV SOLN: 2.0000 g | INTRAVENOUS | Status: DC

## 2015-10-28 MED ORDER — FENTANYL CITRATE (PF) 100 MCG/2ML IJ SOLN
INTRAMUSCULAR | Status: AC
  Filled 2015-10-28: qty 2

## 2015-10-28 MED ORDER — OXYCODONE HCL 5 MG PO TABS
5.0000 mg | ORAL_TABLET | Freq: Once | ORAL | Status: AC | PRN
Start: 2015-10-28 — End: 2015-10-28
  Administered 2015-10-28: 5 mg via ORAL

## 2015-10-28 MED ORDER — FENTANYL CITRATE (PF) 100 MCG/2ML IJ SOLN
50.0000 ug | Freq: Once | INTRAMUSCULAR | Status: AC
  Administered 2015-10-28: 50 ug via INTRAVENOUS

## 2015-10-28 MED ORDER — MIDAZOLAM HCL 2 MG/2ML IJ SOLN
2.0000 mg | Freq: Once | INTRAMUSCULAR | Status: AC
  Administered 2015-10-28: 2 mg via INTRAVENOUS

## 2015-10-28 MED ORDER — OXYCODONE HCL 5 MG/5ML PO SOLN: 5.0000 mg | Freq: Once | ORAL | Status: AC | PRN

## 2015-10-28 MED ORDER — MORPHINE SULFATE (PF) 2 MG/ML IV SOLN: 2.0000 mg | INTRAVENOUS | Status: DC | PRN

## 2015-10-28 MED ORDER — DEXTROSE 5 % IV SOLN: 500.0000 mg | Freq: Four times a day (QID) | INTRAVENOUS | Status: DC | PRN

## 2015-10-28 MED ORDER — DEXAMETHASONE SODIUM PHOSPHATE 10 MG/ML IJ SOLN
INTRAMUSCULAR | Status: DC | PRN
  Administered 2015-10-28: 10 mg via INTRAVENOUS

## 2015-10-28 MED ORDER — PROPOFOL 10 MG/ML IV BOLUS
INTRAVENOUS | Status: DC | PRN
  Administered 2015-10-28: 100 mg via INTRAVENOUS
  Administered 2015-10-28: 50 mg via INTRAVENOUS
  Administered 2015-10-28: 200 mg via INTRAVENOUS

## 2015-10-28 MED ORDER — HYDROMORPHONE HCL 1 MG/ML IJ SOLN
0.2500 mg | INTRAMUSCULAR | Status: DC | PRN
  Administered 2015-10-28: 0.5 mg via INTRAVENOUS

## 2015-10-28 MED ORDER — ACETAMINOPHEN 650 MG RE SUPP
650.0000 mg | Freq: Four times a day (QID) | RECTAL | Status: DC | PRN
Start: 2015-10-28 — End: 2015-10-29

## 2015-10-28 MED ORDER — CHLORHEXIDINE GLUCONATE 4 % EX LIQD: 60.0000 mL | Freq: Once | CUTANEOUS | Status: DC

## 2015-10-28 MED ORDER — VANCOMYCIN HCL IN DEXTROSE 1-5 GM/200ML-% IV SOLN
1000.0000 mg | INTRAVENOUS | Status: AC
  Administered 2015-10-28: 1000 mg via INTRAVENOUS
  Filled 2015-10-28: qty 200

## 2015-10-28 MED ORDER — MIDAZOLAM HCL 2 MG/2ML IJ SOLN
INTRAMUSCULAR | Status: AC
  Administered 2015-10-28: 2 mg via INTRAVENOUS
  Filled 2015-10-28: qty 2

## 2015-10-28 MED ORDER — HYDROMORPHONE HCL 2 MG/ML IJ SOLN
INTRAMUSCULAR | Status: AC
  Filled 2015-10-28: qty 1

## 2015-10-28 MED ORDER — LIDOCAINE 2% (20 MG/ML) 5 ML SYRINGE
INTRAMUSCULAR | Status: AC
  Filled 2015-10-28: qty 5

## 2015-10-28 MED ORDER — DIPHENHYDRAMINE HCL 12.5 MG/5ML PO ELIX: 12.5000 mg | ORAL_SOLUTION | ORAL | Status: DC | PRN

## 2015-10-28 MED ORDER — LIDOCAINE HCL (CARDIAC) 20 MG/ML IV SOLN
INTRAVENOUS | Status: DC | PRN
  Administered 2015-10-28: 100 mg via INTRAVENOUS

## 2015-10-28 MED ORDER — 0.9 % SODIUM CHLORIDE (POUR BTL) OPTIME
TOPICAL | Status: DC | PRN
  Administered 2015-10-28: 1000 mL

## 2015-10-28 MED ORDER — ACETAMINOPHEN 500 MG PO TABS
1000.0000 mg | ORAL_TABLET | Freq: Four times a day (QID) | ORAL | Status: DC
  Administered 2015-10-28 – 2015-10-29 (×4): 1000 mg via ORAL
  Filled 2015-10-28 (×4): qty 2

## 2015-10-28 MED ORDER — METHOCARBAMOL 500 MG PO TABS: 500.0000 mg | ORAL_TABLET | Freq: Four times a day (QID) | ORAL | Status: DC | PRN

## 2015-10-28 MED ORDER — DEXAMETHASONE SODIUM PHOSPHATE 10 MG/ML IJ SOLN
INTRAMUSCULAR | Status: AC
  Filled 2015-10-28: qty 2

## 2015-10-28 MED ORDER — SUCCINYLCHOLINE CHLORIDE 200 MG/10ML IV SOSY
PREFILLED_SYRINGE | INTRAVENOUS | Status: AC
  Filled 2015-10-28: qty 10

## 2015-10-28 SURGICAL SUPPLY — 73 items
BANDAGE ACE 4X5 VEL STRL LF (GAUZE/BANDAGES/DRESSINGS) ×1 IMPLANT
BANDAGE ACE 6X5 VEL STRL LF (GAUZE/BANDAGES/DRESSINGS) ×1 IMPLANT
BANDAGE ESMARK 6X9 LF (GAUZE/BANDAGES/DRESSINGS) IMPLANT
BIT DRILL 2.5X110 QC LCP DISP (BIT) ×1 IMPLANT
BIT DRILL QC 3.5X110 (BIT) ×1 IMPLANT
BLADE SURG 15 STRL LF DISP TIS (BLADE) ×1 IMPLANT
BLADE SURG 15 STRL SS (BLADE) ×2
BNDG CMPR 9X6 STRL LF SNTH (GAUZE/BANDAGES/DRESSINGS) ×1
BNDG COHESIVE 4X5 TAN STRL (GAUZE/BANDAGES/DRESSINGS) ×2 IMPLANT
BNDG COHESIVE 6X5 TAN STRL LF (GAUZE/BANDAGES/DRESSINGS) IMPLANT
BNDG ESMARK 6X9 LF (GAUZE/BANDAGES/DRESSINGS) ×2
CANISTER SUCT 3000ML PPV (MISCELLANEOUS) ×2 IMPLANT
COVER SURGICAL LIGHT HANDLE (MISCELLANEOUS) ×3 IMPLANT
CUFF TOURNIQUET SINGLE 34IN LL (TOURNIQUET CUFF) IMPLANT
CUFF TOURNIQUET SINGLE 44IN (TOURNIQUET CUFF) ×1 IMPLANT
DRAPE C-ARM 42X72 X-RAY (DRAPES) ×2 IMPLANT
DRAPE C-ARMOR (DRAPES) ×2 IMPLANT
DRAPE IMP U-DRAPE 54X76 (DRAPES) ×2 IMPLANT
DRAPE INCISE IOBAN 66X45 STRL (DRAPES) ×1 IMPLANT
DRAPE U-SHAPE 47X51 STRL (DRAPES) ×2 IMPLANT
DURAPREP 26ML APPLICATOR (WOUND CARE) ×2 IMPLANT
ELECT CAUTERY BLADE 6.4 (BLADE) ×2 IMPLANT
ELECT REM PT RETURN 9FT ADLT (ELECTROSURGICAL) ×2
ELECTRODE REM PT RTRN 9FT ADLT (ELECTROSURGICAL) ×1 IMPLANT
FACESHIELD WRAPAROUND (MASK) ×2 IMPLANT
FACESHIELD WRAPAROUND OR TEAM (MASK) ×1 IMPLANT
GAUZE SPONGE 4X4 12PLY STRL (GAUZE/BANDAGES/DRESSINGS) ×2 IMPLANT
GAUZE XEROFORM 5X9 LF (GAUZE/BANDAGES/DRESSINGS) ×2 IMPLANT
GLOVE BIO SURGEON STRL SZ7.5 (GLOVE) ×2 IMPLANT
GLOVE BIOGEL PI IND STRL 8 (GLOVE) ×1 IMPLANT
GLOVE BIOGEL PI INDICATOR 8 (GLOVE) ×1
GLOVE INDICATOR 7.0 STRL GRN (GLOVE) ×1 IMPLANT
GLOVE SURG SS PI 6.0 STRL IVOR (GLOVE) ×1 IMPLANT
GLOVE SURG SS PI 7.0 STRL IVOR (GLOVE) ×1 IMPLANT
GOWN STRL REUS W/ TWL LRG LVL3 (GOWN DISPOSABLE) ×2 IMPLANT
GOWN STRL REUS W/ TWL XL LVL3 (GOWN DISPOSABLE) ×1 IMPLANT
GOWN STRL REUS W/TWL LRG LVL3 (GOWN DISPOSABLE) ×4
GOWN STRL REUS W/TWL XL LVL3 (GOWN DISPOSABLE) ×2
KIT BASIN OR (CUSTOM PROCEDURE TRAY) ×2 IMPLANT
KIT ROOM TURNOVER OR (KITS) ×2 IMPLANT
NDL HYPO 25GX1X1/2 BEV (NEEDLE) IMPLANT
NEEDLE HYPO 25GX1X1/2 BEV (NEEDLE) ×2 IMPLANT
NS IRRIG 1000ML POUR BTL (IV SOLUTION) ×2 IMPLANT
PACK ORTHO EXTREMITY (CUSTOM PROCEDURE TRAY) ×2 IMPLANT
PAD ARMBOARD 7.5X6 YLW CONV (MISCELLANEOUS) ×4 IMPLANT
PAD CAST 3X4 CTTN HI CHSV (CAST SUPPLIES) ×2 IMPLANT
PADDING CAST COTTON 3X4 STRL (CAST SUPPLIES) ×2
PADDING CAST COTTON 6X4 STRL (CAST SUPPLIES) ×3 IMPLANT
PLATE LCP 3.5 1/3 TUB 8HX93 (Plate) ×1 IMPLANT
SCREW CANC FT 12 4.0 (Screw) ×1 IMPLANT
SCREW CANC FT ST SFS 4X14 (Screw) ×1 IMPLANT
SCREW CORTEX 3.5 10MM (Screw) ×2 IMPLANT
SCREW CORTEX 3.5 12MM (Screw) ×1 IMPLANT
SCREW CORTEX 3.5 16MM (Screw) ×1 IMPLANT
SCREW CORTEX 3.5 22MM (Screw) ×1 IMPLANT
SCREW CORTEX 3.5 45MM (Screw) ×1 IMPLANT
SCREW LOCK CORT ST 3.5X10 (Screw) IMPLANT
SCREW LOCK CORT ST 3.5X12 (Screw) IMPLANT
SCREW LOCK CORT ST 3.5X16 (Screw) IMPLANT
SCREW LOCK CORT ST 3.5X22 (Screw) IMPLANT
SPONGE LAP 18X18 X RAY DECT (DISPOSABLE) IMPLANT
STAPLER VISISTAT 35W (STAPLE) ×2 IMPLANT
SUCTION FRAZIER HANDLE 10FR (MISCELLANEOUS) ×1
SUCTION TUBE FRAZIER 10FR DISP (MISCELLANEOUS) ×1 IMPLANT
SUT ETHILON 2 0 FS 18 (SUTURE) ×2 IMPLANT
SUT MON AB 2-0 CT1 36 (SUTURE) ×2 IMPLANT
SUT VIC AB 2-0 CT1 27 (SUTURE) ×2
SUT VIC AB 2-0 CT1 TAPERPNT 27 (SUTURE) IMPLANT
SYR CONTROL 10ML LL (SYRINGE) ×1 IMPLANT
TOWEL OR 17X24 6PK STRL BLUE (TOWEL DISPOSABLE) ×1 IMPLANT
TOWEL OR 17X26 10 PK STRL BLUE (TOWEL DISPOSABLE) ×3 IMPLANT
TUBE CONNECTING 12X1/4 (SUCTIONS) ×2 IMPLANT
WATER STERILE IRR 1000ML POUR (IV SOLUTION) ×1 IMPLANT

## 2015-10-28 NOTE — Anesthesia Procedure Notes (Signed)
Procedure Name: Intubation Date/Time: 10/28/2015 1:38 PM Performed by: Lucinda DellECARLO, Roxann Vierra M Pre-anesthesia Checklist: Patient identified, Emergency Drugs available, Suction available and Patient being monitored Patient Re-evaluated:Patient Re-evaluated prior to inductionOxygen Delivery Method: Circle system utilized Preoxygenation: Pre-oxygenation with 100% oxygen Intubation Type: IV induction Ventilation: Mask ventilation without difficulty Laryngoscope Size: Mac and 3 Grade View: Grade I Tube type: Oral Tube size: 7.0 mm Number of attempts: 1 Airway Equipment and Method: Stylet Placement Confirmation: ETT inserted through vocal cords under direct vision,  positive ETCO2 and breath sounds checked- equal and bilateral Secured at: 21 cm Tube secured with: Tape Dental Injury: Teeth and Oropharynx as per pre-operative assessment  Comments: Airway interventions by Union Pacific CorporationBrad Coomer, SRNA. Attempted #4 LMA placement x2, unable to seat. Easy mask airway. Anectine given. DLx1, atraumatic intubation.

## 2015-10-28 NOTE — Brief Op Note (Signed)
10/28/2015  3:12 PM  PATIENT:  Jake ChurchLauren A Bauers  30 y.o. female  PRE-OPERATIVE DIAGNOSIS:  right ankle fracture  POST-OPERATIVE DIAGNOSIS:  right ankle fracture  PROCEDURE:  Procedure(s): OPEN REDUCTION INTERNAL FIXATION (ORIF) RIGHT ANKLE FRACTURE (Right)  SURGEON:  Surgeon(s) and Role:    * Yolonda KidaJason Patrick Marrion Finan, MD - Primary  PHYSICIAN ASSISTANT:   ASSISTANTS: none   ANESTHESIA:   regional and general ETA  EBL:  Total I/O In: 1000 [I.V.:1000] Out: 10 [Blood:10]  BLOOD ADMINISTERED:none  DRAINS: none   LOCAL MEDICATIONS USED:  NONE  SPECIMEN:  No Specimen  DISPOSITION OF SPECIMEN:  N/A  COUNTS:  YES  TOURNIQUET:   Total Tourniquet Time Documented: Thigh (Right) - 56 minutes Total: Thigh (Right) - 56 minutes   DICTATION: .Note written in EPIC  PLAN OF CARE: Admit for overnight observation  PATIENT DISPOSITION:  PACU - hemodynamically stable.   Delay start of Pharmacological VTE agent (>24hrs) due to surgical blood loss or risk of bleeding: not applicable

## 2015-10-28 NOTE — Op Note (Signed)
   Date of Surgery: 10/28/2015  INDICATIONS: Debra Horn is a 30 y.o.-year-old female who sustained a right ankle fracture; she was indicated for open reduction and internal fixation due to the displaced nature of the articular fracture and came to the operating room today for this procedure. The patient did consent to the procedure after discussion of the risks and benefits.  PREOPERATIVE DIAGNOSIS: right ankle fracture  POSTOPERATIVE DIAGNOSIS: Same.  PROCEDURE: Open treatment of right ankle fracture with internal fixation. Lateral malleolar CPT [id number];  Syndesmotic fixation 27829  SURGEON: Girtie Wiersma P. Aundria Rudogers, M.D.  ASSIST: none.  ANESTHESIA:  general, and regional  TOURNIQUET TIME: 60 min  IV FLUIDS AND URINE: See anesthesia.  ESTIMATED BLOOD LOSS: 30 mL.  IMPLANTS: synthes 1/3 tubular plate with 6.9GE3.5mm screws  3.5 mm screw syndesmotic fixation  COMPLICATIONS: None.  DESCRIPTION OF PROCEDURE: The patient was brought to the operating room and placed supine on the operating table.  The patient had been signed prior to the procedure and this was documented. The patient had the anesthesia placed by the anesthesiologist.  A nonsterile tourniquet was placed on the upper thigh.  The prep verification and incision time-outs were performed to confirm that this was the correct patient, site, side and location. The patient had an SCD on the opposite lower extremity. The patient did receive antibiotics prior to the incision and was re-dosed during the procedure as needed at indicated intervals.  The patient had the lower extremity prepped and draped in the standard surgical fashion.  The extremity was exsanguinated using an esmarch bandage and the tourniquet was inflated to 300 mm Hg.   Incision was made over the distal fibula and the fracture was exposed and reduced anatomically with a clamp. A lag screw was placed. I then applied a 1/3 tubular locking plate and secured it proximally and distally  with non-locking screws. Bone quality was good. I used c-arm to confirm satisfactory reduction and fixation.   I then turned my attention to evaluation of the syndesmosis.  On stress radiography she had no appreciable widening of the medial clear space, however on Cotton's test demonstrated some widening over the fibular-tibial relationship.  At that point the decision was made to place a quadracortical screw through the lateral plate and across the tibia.  This was placed with the syndesmosis reduced on xray.  The wounds were irrigated, and closed with vicryl with routine closure for the skin. Sterile gauze was applied followed by a posterior splint. She was awakened and returned to the PACU in stable and satisfactory condition. There were no complications.    POSTOPERATIVE PLAN: Debra Horn will remain nonweightbearing on this leg for approximately 6 weeks; Debra Horn will return for suture removal in 2 weeks.  She will be immobilized in a short leg splint and then transitioned to a CAM walker at her first follow up appointment.  Debra Horn will receive DVT prophylaxis based on other medications, activity level, and risk ratio of bleeding to thrombosis, and we will plan for aspirin for 4 weeks.  Maryan RuedJason P Khang Hannum, MD [facility] [phone] 6:29 PM

## 2015-10-28 NOTE — H&P (Signed)
ORTHOPAEDIC H and P  REQUESTING PHYSICIAN: Yolonda Kida, MD  PCP:  Jeanine Luz, FNP  Chief Complaint: right ankle fracture  HPI: Debra Horn is a 30 y.o. female who complains of  Right ankle fracture after a fall last week over her nephew's toys.  She was seen by me in clinic this week and planned for surgery today with ORIF of the right ankle.    Denies any new symptoms.  Past Medical History:  Diagnosis Date  . Anemia    low iron  . Anxiety   . Asthma   . Complication of anesthesia    bp dropped in middle of last c-section and started with n/v  . GERD (gastroesophageal reflux disease)    during pregnancy  . Headache    migraines  . Hyperthyroidism    not on medication  . PE (pulmonary embolism) 11/2011  . Pneumonia   . Preeclampsia   . Preeclampsia   . Pregnancy induced hypertension   . Preterm labor   . Pulmonary embolism [facility]) Nov 2013   Past Surgical History:  Procedure Laterality Date  . CESAREAN SECTION     x 2   Social History   Social History  . Marital status: Married    Spouse name: N/A  . Number of children: 2  . Years of education: 14   Occupational History  . Customer Service    Social History Main Topics  . Smoking status: Never Smoker  . Smokeless tobacco: Never Used  . Alcohol use No  . Drug use: No  . Sexual activity: Yes    Birth control/ protection: None   Other Topics Concern  . None   Social History Narrative   Born in [place], [place] and raised in [place], [place]. Currently resides in an apartment with her husband and 2 children. No pets. Fun: Work, sleep   Denies religious beliefs that would effect health care.    Family History  Problem Relation Age of Onset  . Hypertension Maternal Grandfather   . Hypertension Paternal Grandmother   . Cancer Paternal Grandmother   . Hyperlipidemia Paternal Grandmother   . Hypertension Father   . Cancer Sister     skin  . Cancer Maternal Grandmother   . Rheum  arthritis Mother   . Lupus Mother   . Hypothyroidism Maternal Aunt   . Lupus Other    Allergies  Allergen Reactions  . Banana Anaphylaxis  . Cefixime Rash   Prior to Admission medications   Medication Sig Start Date End Date Taking? Authorizing Provider  albuterol (PROVENTIL HFA;VENTOLIN HFA) 108 (90 Base) MCG/ACT inhaler Inhale 2 puffs into the lungs every 4 (four) hours as needed. Patient taking differently: Inhale 2 puffs into the lungs every 4 (four) hours as needed for shortness of breath.  09/28/15  Yes Morrell Riddle, PA-C  aspirin 325 MG tablet Take 650 mg by mouth every 4 (four) hours as needed for mild pain.    Yes Historical Provider, MD  aspirin-acetaminophen-caffeine (EXCEDRIN MIGRAINE) [phone] MG tablet Take 1 tablet by mouth every 6 (six) hours as needed for headache.   Yes Historical Provider, MD  ibuprofen (ADVIL,MOTRIN) 600 MG tablet Take 1 tablet (600 mg total) by mouth every 6 (six) hours as needed. 10/20/15  Yes Barrett Henle, PA-C  oxyCODONE (ROXICODONE) 5 MG immediate release tablet Take 1 tablet (5 mg total) by mouth every 4 (four) hours as needed for severe pain. 10/20/15  Yes Barrett Henle,  PA-C   No results found.  Positive ROS: All other systems have been reviewed and were otherwise negative with the exception of those mentioned in the HPI and as above.  Physical Exam: General: Alert, no acute distress Cardiovascular: No pedal edema Respiratory: No cyanosis, no use of accessory musculature GI: No organomegaly, abdomen is soft and non-tender Skin: No lesions in the area of chief complaint Neurologic: Sensation intact distally Psychiatric: Patient is competent for consent with normal mood and affect Lymphatic: No axillary or cervical lymphadenopathy  MUSCULOSKELETAL:  RLE-  Well fitting short leg splint in place, +SILT sp/dp, wiggles toes with no pain on passive stretch, skin is wwp, cap refill < 2 s  Assessment: Right ankle bimal  equivalent fracture  Plan: [facility] -admit postop for 23 hrs obs -dc home tom am -NWB RLE    Yolonda KidaJason Patrick Kamirah Shugrue, MD Cell [phone]    10/28/2015 12:57 PM

## 2015-10-28 NOTE — Anesthesia Procedure Notes (Signed)
Anesthesia Regional Block:  Popliteal block  Pre-Anesthetic Checklist: ,, timeout performed, Correct Patient, Correct Site, Correct Laterality, Correct Procedure, Correct Position, site marked, Risks and benefits discussed,  Surgical consent,  Pre-op evaluation,  At surgeon's request and post-op pain management  Laterality: Right  Prep: chloraprep       Needles:  Injection technique: Single-shot  Needle Type: Echogenic Stimulator Needle     Needle Length:cm 9 cm Needle Gauge: 21 G    Additional Needles:  Procedures: ultrasound guided (picture in chart) and nerve stimulator Popliteal block  Nerve Stimulator or Paresthesia:  Response: plantar flexion of foot, 0.45 mA,   Additional Responses:   Narrative:  Start time: 10/28/2015 11:51 AM End time: 10/28/2015 11:58 AM Injection made incrementally with aspirations every 5 mL.  Performed by: Personally  Anesthesiologist: Riely Oetken  Additional Notes: Functioning IV was confirmed and monitors were applied.  A 90mm 21ga Arrow echogenic stimulator needle was used. Sterile prep and drape,hand hygiene and sterile gloves were used.  Negative aspiration and negative test dose prior to incremental administration of local anesthetic. The patient tolerated the procedure well.  Ultrasound guidance: relevent anatomy identified, needle position confirmed, local anesthetic spread visualized around nerve(s), vascular puncture avoided.  Image printed for medical record.

## 2015-10-28 NOTE — Anesthesia Postprocedure Evaluation (Signed)
Anesthesia Post Note  Patient: Debra Horn  Procedure(s) Performed: Procedure(s) (LRB): OPEN REDUCTION INTERNAL FIXATION (ORIF) RIGHT ANKLE FRACTURE (Right)  Patient location during evaluation: PACU Anesthesia Type: General and Regional Level of consciousness: awake and alert and patient cooperative Pain management: pain level controlled Vital Signs Assessment: post-procedure vital signs reviewed and stable Respiratory status: spontaneous breathing and respiratory function stable Cardiovascular status: stable Anesthetic complications: no    Last Vitals:  Vitals:   10/28/15 1540 10/28/15 1555  BP: 125/81 (!) 104/56  Pulse: 92 89  Resp: 15 12  Temp:      Last Pain:  Vitals:   10/28/15 1008  TempSrc: Oral        RLE Motor Response: (P) No movement due to regional block (10/28/15 1555) RLE Sensation: (P) No sensation (absent) (10/28/15 1555)      Magali Bray S

## 2015-10-28 NOTE — Anesthesia Preprocedure Evaluation (Signed)
Anesthesia Evaluation  Patient identified by MRN, date of birth, ID band Patient awake    Reviewed: Allergy & Precautions, H&P , NPO status , Patient's Chart, lab work & pertinent test results  Airway Mallampati: II   Neck ROM: full    Dental   Pulmonary asthma , PE   breath sounds clear to auscultation       Cardiovascular hypertension,  Rhythm:regular Rate:Normal     Neuro/Psych PSYCHIATRIC DISORDERS Anxiety    GI/Hepatic GERD  ,  Endo/Other  Hyperthyroidism   Renal/GU      Musculoskeletal   Abdominal   Peds  Hematology   Anesthesia Other Findings   Reproductive/Obstetrics                             Anesthesia Physical Anesthesia Plan  ASA: II  Anesthesia Plan: General and Regional   Post-op Pain Management:  Regional for Post-op pain   Induction: Intravenous  Airway Management Planned: LMA  Additional Equipment:   Intra-op Plan:   Post-operative Plan:   Informed Consent: I have reviewed the patients History and Physical, chart, labs and discussed the procedure including the risks, benefits and alternatives for the proposed anesthesia with the patient or authorized representative who has indicated his/her understanding and acceptance.     Plan Discussed with: CRNA, Anesthesiologist and Surgeon  Anesthesia Plan Comments:         Anesthesia Quick Evaluation

## 2015-10-28 NOTE — Transfer of Care (Signed)
Immediate Anesthesia Transfer of Care Note  Patient: Debra Horn  Procedure(s) Performed: Procedure(s): OPEN REDUCTION INTERNAL FIXATION (ORIF) RIGHT ANKLE FRACTURE (Right)  Patient Location: PACU  Anesthesia Type:General  Level of Consciousness: awake, alert , oriented and patient cooperative  Airway & Oxygen Therapy: Patient Spontanous Breathing and Patient connected to nasal cannula oxygen  Post-op Assessment: Report given to RN, Post -op Vital signs reviewed and stable and Patient moving all extremities  Post vital signs: Reviewed and stable  Last Vitals:  Vitals:   10/28/15 1008  BP: 133/73  Pulse: 70  Resp: 16  Temp: 36.9 C    Last Pain:  Vitals:   10/28/15 1008  TempSrc: Oral      Patients Stated Pain Goal: 3 (10/28/15 1022)  Complications: No apparent anesthesia complications

## 2015-10-29 ENCOUNTER — Encounter (HOSPITAL_COMMUNITY): Payer: Self-pay | Admitting: Orthopedic Surgery

## 2015-10-29 DIAGNOSIS — S8261XA Displaced fracture of lateral malleolus of right fibula, initial encounter for closed fracture: Secondary | ICD-10-CM | POA: Diagnosis not present

## 2015-10-29 MED ORDER — ONDANSETRON 4 MG PO TBDP: 4.0000 mg | ORAL_TABLET | Freq: Three times a day (TID) | ORAL | 0 refills | Status: DC | PRN

## 2015-10-29 MED ORDER — OXYCODONE HCL 5 MG PO TABS: 5.0000 mg | ORAL_TABLET | ORAL | 0 refills | Status: DC | PRN

## 2015-10-29 NOTE — Progress Notes (Signed)
   Subjective:  Patient reports pain as well controlled, still has some residual effect of the block.  Denies n/v, SOB.  Objective:   VITALS:   Vitals:   10/28/15 2000 10/28/15 2125 10/29/15 0100 10/29/15 0451  BP: 129/75  124/67 112/63  Pulse: 72  92 64  Resp: 18  17 17   Temp: 97.7 F (36.5 C)  98.2 F (36.8 C) 97.7 F (36.5 C)  TempSrc: Oral  Oral Oral  SpO2: 97% 99% 96% 97%  Weight:      Height:        Splint in place Wiggles toes Paresthesias in toes 2/2 resolving block Toes [facility]  Lab Results  Component Value Date   WBC 5.6 10/28/2015   HGB 11.8 (L) 10/28/2015   HCT 37.6 10/28/2015   MCV 86.4 10/28/2015   PLT 441 (H) 10/28/2015   BMET    Component Value Date/Time   NA 139 10/28/2015 1041   K 3.7 10/28/2015 1041   CL 105 10/28/2015 1041   CO2 24 10/28/2015 1041   GLUCOSE 78 10/28/2015 1041   BUN 12 10/28/2015 1041   CREATININE 0.70 10/28/2015 1041   CALCIUM 9.3 10/28/2015 1041   GFRNONAA >60 10/28/2015 1041   GFRAA >60 10/28/2015 1041     Assessment/Plan: 1 Day Post-Op   Active Problems:   Ankle fracture, lateral malleolus, closed   Plan for discharge today She will arrange for family pick up NWB RLE elevate   Yolonda KidaJason Patrick Takumi Din 10/29/2015, 7:10 AM   Maryan RuedJason P Nyal Schachter, MD [phone]

## 2015-11-04 NOTE — Discharge Summary (Signed)
Patient ID: Debra Horn MRN: [id number] DOB/AGE: 08-19-1985 30 y.o.  Admit date: 10/28/2015 Discharge date: 10/29/2015  Primary Diagnosis: Right ankle fracture  Admission Diagnoses:  Past Medical History:  Diagnosis Date  . Anemia    low iron  . Anxiety   . Asthma   . Complication of anesthesia    bp dropped in middle of last c-section and started with n/v  . GERD (gastroesophageal reflux disease)    during pregnancy  . Headache    "couple days/week" (10/28/2015)  . Hyperthyroidism    not on medication  . PE (pulmonary embolism) 11/2011  . Pneumonia   . Preeclampsia   . Preeclampsia   . Pregnancy induced hypertension   . Preterm labor    Discharge Diagnoses:   Active Problems:   Ankle fracture, lateral malleolus, closed  Estimated body mass index is 37.91 kg/m as calculated from the following:   Height as of this encounter: 5' 3" (1.6 m).   Weight as of this encounter: 97.1 kg (214 lb).  Procedure:  Procedure(s) (LRB): OPEN REDUCTION INTERNAL FIXATION (ORIF) RIGHT ANKLE FRACTURE (Right)   Consults: None  HPI: Debra Horn is a 30 y.o.-year-old female who sustained a right ankle fracture; she was indicated for open reduction and internal fixation due to the displaced nature of the articular fracture and came to the operating room for this procedure. The patient did consent to the procedure after discussion of the risks and benefits. Laboratory Data: Admission on 10/28/2015, Discharged on 10/29/2015  Component Date Value Ref Range Status  . WBC 10/28/2015 5.6  4.0 - 10.5 K/uL Final  . RBC 10/28/2015 4.35  3.87 - 5.11 MIL/uL Final  . Hemoglobin 10/28/2015 11.8* 12.0 - 15.0 g/dL Final  . HCT 10/28/2015 37.6  36.0 - 46.0 % Final  . MCV 10/28/2015 86.4  78.0 - 100.0 fL Final  . MCH 10/28/2015 27.1  26.0 - 34.0 pg Final  . MCHC 10/28/2015 31.4  30.0 - 36.0 g/dL Final  . RDW 10/28/2015 15.3  11.5 - 15.5 % Final  . Platelets 10/28/2015 441* 150 - 400 K/uL Final  .  Preg, Serum 10/28/2015 NEGATIVE  NEGATIVE Final   Comment:        THE SENSITIVITY OF THIS METHODOLOGY IS >10 mIU/mL.   Marland Kitchen Sodium 10/28/2015 139  135 - 145 mmol/L Final  . Potassium 10/28/2015 3.7  3.5 - 5.1 mmol/L Final  . Chloride 10/28/2015 105  101 - 111 mmol/L Final  . CO2 10/28/2015 24  22 - 32 mmol/L Final  . Glucose, Bld 10/28/2015 78  65 - 99 mg/dL Final  . BUN 10/28/2015 12  6 - 20 mg/dL Final  . Creatinine, Ser 10/28/2015 0.70  0.44 - 1.00 mg/dL Final  . Calcium 10/28/2015 9.3  8.9 - 10.3 mg/dL Final  . GFR calc non Af Amer 10/28/2015 >60  >60 mL/min Final  . GFR calc Af Amer 10/28/2015 >60  >60 mL/min Final   Comment: (NOTE) The eGFR has been calculated using the CKD EPI equation. This calculation has not been validated in all clinical situations. eGFR's persistently <60 mL/min signify possible Chronic Kidney Disease.   . Anion gap 10/28/2015 10  5 - 15 Final     X-Rays:Dg Ankle Complete Right  Result Date: 10/28/2015 CLINICAL DATA:  Operative imaging during ORIF of a right ankle fracture. EXAM: RIGHT ANKLE - COMPLETE 3+ VIEW; DG C-ARM 61-120 MIN COMPARISON:  10/20/2015 FINDINGS: The fracture of the distal fibula has been reduced  with a lateral fixation plate and associated screws. One screw extends from the distal fibula to the distal tibial metaphysis. The fracture is well aligned. The orthopedic hardware is well-seated. Ankle mortise is normally spaced and aligned. IMPRESSION: Well-aligned distal right fibular fracture following ORIF. Electronically Signed   By: Lajean Manes M.D.   On: 10/28/2015 15:08   Dg Ankle Complete Right  Result Date: 10/20/2015 CLINICAL DATA:  Right ankle pain after trip and fall. Ankle deformity. EXAM: RIGHT ANKLE - COMPLETE 3+ VIEW COMPARISON:  None. FINDINGS: There is anterior dislocation of the tibial plafond relative to the talar dome on the lateral projection. There is an acute distal diaphyseal fracture of the fibula with dorsal and  lateral angulation of the distal fracture fragment. Fracture appears to extend into the ankle joint. The ankle mortise is widened along its medial aspect to 9 mm. Tiny avulsed fracture fragments are seen along the medial aspect of the talus just lateral caudal to the malleolus. Base of fifth metatarsal appears intact. Subtalar joint is maintained. IMPRESSION: Anterior dislocation of the ankle joint with widened mortise medially. Acute, closed, intra-articular fracture of the distal diaphysis of the fibula extending into the ankle joint with lateral displacement and dorsolateral angulation of the distal fracture fragment. Electronically Signed   By: Ashley Royalty M.D.   On: 10/20/2015 20:39   Dg Ankle Right Port  Result Date: 10/20/2015 CLINICAL DATA:  Follow-up exam status post splinting and reduction EXAM: PORTABLE RIGHT ANKLE - 2 VIEW COMPARISON:  Prior radiograph from earlier the same day. FINDINGS: Splinting mature overlies right ankle, somewhat limiting evaluation for fine osseous detail. Previously identified oblique distal fibular fracture again seen. There is minimal residual posterior displacement. Ankle joint canal and anatomic alignment. No new fracture. IMPRESSION: 1. Right ankle now in anatomic alignment status post splinting and reduction. 2. Distal right fibular fracture in near anatomic alignment status post reduction. Electronically Signed   By: Jeannine Boga M.D.   On: 10/20/2015 23:26   Dg C-arm 1-60 Min  Result Date: 10/28/2015 CLINICAL DATA:  Operative imaging during ORIF of a right ankle fracture. EXAM: RIGHT ANKLE - COMPLETE 3+ VIEW; DG C-ARM 61-120 MIN COMPARISON:  10/20/2015 FINDINGS: The fracture of the distal fibula has been reduced with a lateral fixation plate and associated screws. One screw extends from the distal fibula to the distal tibial metaphysis. The fracture is well aligned. The orthopedic hardware is well-seated. Ankle mortise is normally spaced and aligned.  IMPRESSION: Well-aligned distal right fibular fracture following ORIF. Electronically Signed   By: Lajean Manes M.D.   On: 10/28/2015 15:08    EKG: Orders placed or performed during the hospital encounter of 09/01/15  . EKG 12-Lead  . EKG 12-Lead  . ED EKG within 10 minutes  . ED EKG within 10 minutes  . EKG     Hospital Course: Debra Horn is a 30 y.o. who was admitted to [facility] They were brought to the operating room on 10/28/2015 and underwent Procedure(s): OPEN REDUCTION INTERNAL FIXATION (ORIF) RIGHT ANKLE FRACTURE.  Patient tolerated the procedure well and was later transferred to the recovery room and then to the orthopaedic floor for postoperative care.  They were given PO and IV analgesics for pain control following their surgery.  They were given 24 hours of postoperative antibiotics of  Anti-infectives    Start     Dose/Rate Route Frequency Ordered Stop   10/28/15 1030  vancomycin (VANCOCIN) IVPB 1000 mg/200 mL  premix     1,000 mg 200 mL/hr over 60 Minutes Intravenous To ShortStay Surgical 10/28/15 1023 10/28/15 1425   10/28/15 1013  ceFAZolin (ANCEF) 2-4 GM/100ML-% IVPB    Comments:  Gallman, Kathie   : cabinet override      10/28/15 1013 10/28/15 2229   10/28/15 1010  ceFAZolin (ANCEF) IVPB 2g/100 mL premix  Status:  Discontinued     2 g 200 mL/hr over 30 Minutes Intravenous On call to O.R. 10/28/15 1010 10/28/15 1023     and started on DVT prophylaxis in the form of Aspirin.   PT and OT were ordered.  Discharge planning consulted to help with postop disposition and equipment needs.  Patient had a good night on the evening of surgery.  They started to get up OOB with therapy on day one.  Patient was seen in rounds and was ready to go home.   Diet: Regular diet Activity:NWB Follow-up:in 2 weeks Disposition - Home Discharged Condition: good   Discharge Instructions    Call MD / Call 911    Complete by:  As directed    If you experience chest pain  or shortness of breath, CALL 911 and be transported to the hospital emergency room.  If you develope a fever above 101 F, pus (white drainage) or increased drainage or redness at the wound, or calf pain, call your surgeon's office.   Constipation Prevention    Complete by:  As directed    Drink plenty of fluids.  Prune juice may be helpful.  You may use a stool softener, such as Colace (over the counter) 100 mg twice a day.  Use MiraLax (over the counter) for constipation as needed.   Diet - low sodium heart healthy    Complete by:  As directed    Discharge instructions    Complete by:  As directed    -maintain splint until follow up -keep splint clean and dry -elevate extremity -NWB RLE -take a 325 mg aspirin once daily for 4 weeks -use pain medication as prescribed, ok to take tylenol with it, but please no NSAIDs   Driving restrictions    Complete by:  As directed    No driving for 8 weeks   Increase activity slowly as tolerated    Complete by:  As directed        Medication List    STOP taking these medications   aspirin 325 MG tablet   ibuprofen 600 MG tablet Commonly known as:  ADVIL,MOTRIN     TAKE these medications   albuterol 108 (90 Base) MCG/ACT inhaler Commonly known as:  PROVENTIL HFA;VENTOLIN HFA Inhale 2 puffs into the lungs every 4 (four) hours as needed. What changed:  reasons to take this   aspirin-acetaminophen-caffeine 250-250-65 MG tablet Commonly known as:  EXCEDRIN MIGRAINE Take 1 tablet by mouth every 6 (six) hours as needed for headache.   ondansetron 4 MG disintegrating tablet Commonly known as:  ZOFRAN ODT Take 1 tablet (4 mg total) by mouth every 8 (eight) hours as needed for nausea or vomiting.   oxyCODONE 5 MG immediate release tablet Commonly known as:  ROXICODONE Take 1 tablet (5 mg total) by mouth every 4 (four) hours as needed for severe pain. What changed:  Another medication with the same name was added. Make sure you understand how  and when to take each.   oxyCODONE 5 MG immediate release tablet Commonly known as:  ROXICODONE Take 1-2 tablets (5-10 mg total) by  mouth every 4 (four) hours as needed for severe pain. What changed:  You were already taking a medication with the same name, and this prescription was added. Make sure you understand how and when to take each.        Signed: Geralynn Rile, MD Orthopaedic Surgery 11/04/2015, 10:33 PM

## 2015-12-08 ENCOUNTER — Ambulatory Visit: Payer: Medicaid Other | Attending: Orthopedic Surgery | Admitting: Physical Therapy

## 2015-12-08 DIAGNOSIS — M25671 Stiffness of right ankle, not elsewhere classified: Secondary | ICD-10-CM | POA: Diagnosis not present

## 2015-12-08 DIAGNOSIS — R2689 Other abnormalities of gait and mobility: Secondary | ICD-10-CM | POA: Diagnosis present

## 2015-12-08 DIAGNOSIS — R262 Difficulty in walking, not elsewhere classified: Secondary | ICD-10-CM | POA: Diagnosis present

## 2015-12-09 ENCOUNTER — Encounter: Payer: Self-pay | Admitting: Physical Therapy

## 2015-12-09 NOTE — Therapy (Addendum)
[facility] [address] [place], [place], [address] Phone: [phone]   Fax:  [phone]  Physical Therapy Evaluation  Patient Details  Name: Debra Horn MRN: [id number] Date of Birth: November 20, 1985 Referring Provider: Duwayne Heckogers, Jason, MD  Encounter Date: 12/08/2015      PT End of Session - 12/08/15 1015    Visit Number 1   Number of Visits 16   Date for PT Re-Evaluation 02/08/16   PT Start Time 0925   PT Stop Time 1010   PT Time Calculation (min) 45 min   Activity Tolerance Patient tolerated treatment well   Behavior During Therapy [facility] for tasks assessed/performed      Past Medical History:  Diagnosis Date  . Anemia    low iron  . Anxiety   . Asthma   . Complication of anesthesia    bp dropped in middle of last c-section and started with n/v  . GERD (gastroesophageal reflux disease)    during pregnancy  . Headache    "couple days/week" (10/28/2015)  . Hyperthyroidism    not on medication  . PE (pulmonary embolism) 11/2011  . Pneumonia   . Preeclampsia   . Preeclampsia   . Pregnancy induced hypertension   . Preterm labor     Past Surgical History:  Procedure Laterality Date  . CESAREAN SECTION  2008; 2014  . FRACTURE SURGERY    . ORIF ANKLE FRACTURE Right 10/28/2015  . ORIF ANKLE FRACTURE Right 10/28/2015   Procedure: OPEN REDUCTION INTERNAL FIXATION (ORIF) RIGHT ANKLE FRACTURE;  Surgeon: Yolonda KidaJason Patrick Rogers, MD;  [facility];  Service: Orthopedics;  Laterality: Right;    There were no vitals filed for this visit.       Subjective Assessment - 12/08/15 0940    Subjective Pt arriving to therapy after tripping over toys at home and experiencing a fall. Pt s/p R ankle fx with ORIF. Per pt's Dr. visit on 11/17/15 she has 4 weeks of NWB which will end on 12/15/15.  Waiting on MD to fax specific weight bearing orders after the 12/15/15. Pt reporting no pain at rest. Pt amb with a RW NWB on R LE.     Patient is accompained by: --  friend   How long can you sit comfortably? unlimited   How long can you stand comfortably? 5 minutes   How long can you walk comfortably? 5 minutes   Patient Stated Goals Return to Prior level of functioning, walking   Currently in Pain? No/denies                               PT Education - 12/08/15 0948    Education provided Yes   Education Details NWB, HEP   Person(s) Educated Patient;Other (comment)   Methods Explanation;Handout;Demonstration;Tactile cues   Comprehension Verbalized understanding;Returned demonstration          PT Short Term Goals - 12/09/15 1149      PT SHORT TERM GOAL #1   Title Pt will be independent in her HEP   Baseline 4   Period Weeks   Status New     PT SHORT TERM GOAL #2   Title Pt will begin weight bearing per MD orders on R LE.    Time 4   Period Weeks   Status New           PT Long Term Goals - 12/09/15 1150  PT LONG TERM GOAL #1   Title Pt will be independent with ambulating with no device >300 feet on level surfaces with step through gait pattern.    Time 8   Period Weeks   Status New     PT LONG TERM GOAL #2   Title Pt wil lbe able to climb a fight of stairs with single hand rail with step over step pattern.    Time 8   Period Weeks   Status New     PT LONG TERM GOAL #3   Title Pt will imporve her active  DF by 10 degrees in order to improve gait and heel strike.    Baseline DF: 5 degrees   Time 8   Period Weeks   Status New     PT LONG TERM GOAL #4   Title Pt will improve her FOTO score from 68% limitation to </= 48% limitation in order to improve functional mobility.    Time 8   Period Weeks   Status New               Plan - 12/09/15 1000    Clinical Impression Statement Pt arriving to therapy using a RW in a walking boot and NWB on her R LE. Pts friend accompaning her. Pt reported R ORIF after falling in her home tripping over toys. Pts surgery  was on 10/27/09.  Pt to follow up with her MD on 12/14/15 about  increasing her weight bearing status. Pt instructed in HEP for AROM. No complaints of pain. Skilled therapy needed to address pt's decreased ROM, strength and gait with the below interventions in order for pt to return to PLOF.     Rehab Potential Excellent   PT Frequency 2x / week   PT Duration 8 weeks   PT Treatment/Interventions ADLs/Self Care Home Management;Therapeutic exercise;Balance training;Neuromuscular re-education;Patient/family education;Functional mobility training;Therapeutic activities;Stair training;Gait training;Cryotherapy;Electrical Stimulation;Iontophoresis 4mg /ml Dexamethasone;Passive range of motion;Manual techniques;Taping;Vasopneumatic Device   PT Next Visit Plan measure ankle swelling, Varify weight bearing after pt's MD visit on 12/14/15. Ankle ROM, strengthening   PT Home Exercise Plan Towel pulls, ABC's ankle pumps   Consulted and Agree with Plan of Care Patient;Family member/caregiver   Family Member Consulted best friend was present      Patient will benefit from skilled therapeutic intervention in order to improve the following deficits and impairments:  Abnormal gait, Decreased activity tolerance, Decreased mobility, Decreased strength, Difficulty walking, Impaired perceived functional ability, Pain, Decreased range of motion, Decreased balance  Visit Diagnosis: Stiffness of right ankle, not elsewhere classified - Plan: PT plan of care cert/re-cert  Difficulty in walking, not elsewhere classified - Plan: PT plan of care cert/re-cert  Other abnormalities of gait and mobility - Plan: PT plan of care cert/re-cert     Problem List Patient Active Problem List   Diagnosis Date Noted  . Ankle fracture, lateral malleolus, closed 10/28/2015  . Dizziness 02/19/2014  . Family history of systemic lupus erythematosus (SLE) in mother 02/19/2014  . Asthma 02/19/2014  . Hyperthyroidism 02/19/2014  .  Preterm NB deliv by C-section, 1,750-1,999 gm, 31-32 completed weeks 08/08/2012  . Previous cesarean section 08/08/2012  . Hx pulmonary embolism 07/24/2012  . Supervision of high-risk pregnancy 07/24/2012    Sharmon LeydenJennifer R Rosette Bellavance , MPT 12/09/2015, 12:06 PM  [facility] [address] [place], [place], [address] Phone: [phone]   Fax:  [phone]  Name: Debra Horn MRN: [id number] Date of Birth: 02-04-1985  Pt discharged due to not returning since initial evaluation.   Percell Boston, PT, DPT 01/31/16 1:52 PM Phone: [phone] Fax: [phone]

## 2015-12-16 ENCOUNTER — Ambulatory Visit: Payer: Medicaid Other | Admitting: Physical Therapy

## 2015-12-20 ENCOUNTER — Ambulatory Visit: Payer: Medicaid Other | Admitting: Physical Therapy

## 2015-12-21 ENCOUNTER — Telehealth: Payer: Self-pay | Admitting: Physical Therapy

## 2015-12-21 NOTE — Telephone Encounter (Signed)
Left message on 12/21/15 at 4:08 pm. Message was left on 12/14 by Crystal, I reminded her of that voicemail and that tomorrow would be her 2nd no show and therefore appointments will be cancelled and she will have to schedule one at a time.  Rylee Huestis C. Jennah Satchell PT, DPT 12/21/15 4:11 PM

## 2015-12-22 ENCOUNTER — Ambulatory Visit: Payer: Medicaid Other | Admitting: Physical Therapy

## 2015-12-29 ENCOUNTER — Ambulatory Visit: Payer: Medicaid Other | Admitting: Physical Therapy

## 2015-12-31 ENCOUNTER — Ambulatory Visit: Payer: Medicaid Other | Admitting: Physical Therapy

## 2016-01-05 ENCOUNTER — Encounter: Payer: PRIVATE HEALTH INSURANCE | Admitting: Physical Therapy

## 2016-01-07 ENCOUNTER — Encounter: Payer: PRIVATE HEALTH INSURANCE | Admitting: Physical Therapy

## 2016-01-12 ENCOUNTER — Encounter: Payer: PRIVATE HEALTH INSURANCE | Admitting: Physical Therapy

## 2016-01-14 ENCOUNTER — Encounter: Payer: PRIVATE HEALTH INSURANCE | Admitting: Physical Therapy

## 2016-01-26 ENCOUNTER — Other Ambulatory Visit: Payer: Self-pay | Admitting: Physician Assistant

## 2016-01-27 NOTE — Telephone Encounter (Signed)
Sent in 1 month Rf - pt is using an inhaler a month - spoke with pt that she needs an OV - she was transferred to make an appt.

## 2016-02-06 ENCOUNTER — Emergency Department (HOSPITAL_COMMUNITY): Payer: Medicaid Other

## 2016-02-06 ENCOUNTER — Inpatient Hospital Stay (HOSPITAL_COMMUNITY)
Admission: EM | Admit: 2016-02-06 | Discharge: 2016-02-08 | DRG: 194 | Disposition: A | Discharge status: Home / Self Care | Payer: Medicaid Other | Attending: Internal Medicine | Admitting: Internal Medicine

## 2016-02-06 ENCOUNTER — Encounter (HOSPITAL_COMMUNITY): Payer: Self-pay | Admitting: Emergency Medicine

## 2016-02-06 DIAGNOSIS — D649 Anemia, unspecified: Secondary | ICD-10-CM | POA: Diagnosis present

## 2016-02-06 DIAGNOSIS — J4541 Moderate persistent asthma with (acute) exacerbation: Secondary | ICD-10-CM | POA: Diagnosis not present

## 2016-02-06 DIAGNOSIS — Z79899 Other long term (current) drug therapy: Secondary | ICD-10-CM

## 2016-02-06 DIAGNOSIS — J40 Bronchitis, not specified as acute or chronic: Secondary | ICD-10-CM | POA: Diagnosis present

## 2016-02-06 DIAGNOSIS — R6510 Systemic inflammatory response syndrome (SIRS) of non-infectious origin without acute organ dysfunction: Secondary | ICD-10-CM | POA: Diagnosis present

## 2016-02-06 DIAGNOSIS — E876 Hypokalemia: Secondary | ICD-10-CM | POA: Diagnosis present

## 2016-02-06 DIAGNOSIS — J45901 Unspecified asthma with (acute) exacerbation: Secondary | ICD-10-CM

## 2016-02-06 DIAGNOSIS — D509 Iron deficiency anemia, unspecified: Secondary | ICD-10-CM | POA: Diagnosis present

## 2016-02-06 DIAGNOSIS — J101 Influenza due to other identified influenza virus with other respiratory manifestations: Secondary | ICD-10-CM | POA: Diagnosis not present

## 2016-02-06 DIAGNOSIS — Z86711 Personal history of pulmonary embolism: Secondary | ICD-10-CM

## 2016-02-06 DIAGNOSIS — D6489 Other specified anemias: Secondary | ICD-10-CM | POA: Diagnosis present

## 2016-02-06 DIAGNOSIS — Z91018 Allergy to other foods: Secondary | ICD-10-CM

## 2016-02-06 DIAGNOSIS — Z8709 Personal history of other diseases of the respiratory system: Secondary | ICD-10-CM

## 2016-02-06 DIAGNOSIS — Z881 Allergy status to other antibiotic agents status: Secondary | ICD-10-CM

## 2016-02-06 LAB — COMPREHENSIVE METABOLIC PANEL
ALBUMIN: 4 g/dL (ref 3.5–5.0)
ALK PHOS: 88 U/L (ref 38–126)
ALT: 21 U/L (ref 14–54)
AST: 25 U/L (ref 15–41)
Anion gap: 8 (ref 5–15)
BILIRUBIN TOTAL: 0.4 mg/dL (ref 0.3–1.2)
BUN: 9 mg/dL (ref 6–20)
CALCIUM: 9.1 mg/dL (ref 8.9–10.3)
CO2: 23 mmol/L (ref 22–32)
CREATININE: 0.8 mg/dL (ref 0.44–1.00)
Chloride: 106 mmol/L (ref 101–111)
GFR calc Af Amer: >60 mL/min (ref >60)
GFR calc non Af Amer: >60 mL/min (ref >60)
GLUCOSE: 91 mg/dL (ref 65–99)
Potassium: 3.3 mmol/L — ABNORMAL LOW (ref 3.5–5.1)
Sodium: 137 mmol/L (ref 135–145)
TOTAL PROTEIN: 8.2 g/dL — AB (ref 6.5–8.1)

## 2016-02-06 LAB — RESPIRATORY PANEL BY PCR
ADENOVIRUS-RVPPCR: NOT DETECTED
BORDETELLA PERTUSSIS-RVPCR: NOT DETECTED
CHLAMYDOPHILA PNEUMONIAE-RVPPCR: NOT DETECTED
CORONAVIRUS NL63-RVPPCR: NOT DETECTED
Coronavirus 229E: NOT DETECTED
Coronavirus HKU1: NOT DETECTED
Coronavirus OC43: NOT DETECTED
INFLUENZA A H3-RVPPCR: DETECTED — AB
Influenza B: NOT DETECTED
METAPNEUMOVIRUS-RVPPCR: NOT DETECTED
Mycoplasma pneumoniae: NOT DETECTED
PARAINFLUENZA VIRUS 3-RVPPCR: NOT DETECTED
PARAINFLUENZA VIRUS 4-RVPPCR: NOT DETECTED
Parainfluenza Virus 1: NOT DETECTED
Parainfluenza Virus 2: NOT DETECTED
RHINOVIRUS / ENTEROVIRUS - RVPPCR: NOT DETECTED
Respiratory Syncytial Virus: NOT DETECTED

## 2016-02-06 LAB — URINALYSIS, ROUTINE W REFLEX MICROSCOPIC
Bilirubin Urine: NEGATIVE
GLUCOSE, UA: NEGATIVE mg/dL
HGB URINE DIPSTICK: NEGATIVE
Ketones, ur: 5 mg/dL — AB
Leukocytes, UA: NEGATIVE
Nitrite: NEGATIVE
Protein, ur: NEGATIVE mg/dL
pH: 6 (ref 5.0–8.0)

## 2016-02-06 LAB — CBC
HCT: 33.5 % — ABNORMAL LOW (ref 36.0–46.0)
HEMOGLOBIN: 10.9 g/dL — AB (ref 12.0–15.0)
MCH: 27 pg (ref 26.0–34.0)
MCHC: 32.5 g/dL (ref 30.0–36.0)
MCV: 82.9 fL (ref 78.0–100.0)
PLATELETS: 385 10*3/uL (ref 150–400)
RBC: 4.04 MIL/uL (ref 3.87–5.11)
RDW: 15.1 % (ref 11.5–15.5)
WBC: 6.2 10*3/uL (ref 4.0–10.5)

## 2016-02-06 LAB — PROTIME-INR
INR: 1.08
Prothrombin Time: 14 seconds (ref 11.4–15.2)

## 2016-02-06 LAB — I-STAT CG4 LACTIC ACID, ED
LACTIC ACID, VENOUS: 0.95 mmol/L (ref 0.5–1.9)
Lactic Acid, Venous: 1.39 mmol/L (ref 0.5–1.9)

## 2016-02-06 LAB — HCG, QUANTITATIVE, PREGNANCY

## 2016-02-06 LAB — INFLUENZA PANEL BY PCR (TYPE A & B)
Influenza A By PCR: POSITIVE — AB
Influenza B By PCR: NEGATIVE

## 2016-02-06 LAB — I-STAT TROPONIN, ED: Troponin i, poc: 0 ng/mL (ref 0.00–0.08)

## 2016-02-06 MED ORDER — ONDANSETRON HCL 4 MG/2ML IJ SOLN: 4.0000 mg | Freq: Four times a day (QID) | INTRAMUSCULAR | Status: DC | PRN

## 2016-02-06 MED ORDER — IOPAMIDOL (ISOVUE-370) INJECTION 76%
INTRAVENOUS | Status: AC
  Administered 2016-02-06: 100 mL
  Filled 2016-02-06: qty 100

## 2016-02-06 MED ORDER — VANCOMYCIN HCL IN DEXTROSE 1-5 GM/200ML-% IV SOLN
1000.0000 mg | Freq: Once | INTRAVENOUS | Status: DC
  Filled 2016-02-06: qty 200

## 2016-02-06 MED ORDER — ACETAMINOPHEN 325 MG PO TABS
325.0000 mg | ORAL_TABLET | Freq: Once | ORAL | Status: AC
  Administered 2016-02-06: 325 mg via ORAL
  Filled 2016-02-06: qty 1

## 2016-02-06 MED ORDER — LEVOFLOXACIN IN D5W 750 MG/150ML IV SOLN
750.0000 mg | Freq: Once | INTRAVENOUS | Status: AC
  Administered 2016-02-06: 750 mg via INTRAVENOUS
  Filled 2016-02-06: qty 150

## 2016-02-06 MED ORDER — ALBUTEROL SULFATE (2.5 MG/3ML) 0.083% IN NEBU
2.5000 mg | INHALATION_SOLUTION | RESPIRATORY_TRACT | Status: DC | PRN
  Administered 2016-02-08 (×2): 2.5 mg via RESPIRATORY_TRACT
  Filled 2016-02-06 (×2): qty 3

## 2016-02-06 MED ORDER — ACETAMINOPHEN 650 MG RE SUPP: 650.0000 mg | Freq: Four times a day (QID) | RECTAL | Status: DC | PRN

## 2016-02-06 MED ORDER — IPRATROPIUM-ALBUTEROL 0.5-2.5 (3) MG/3ML IN SOLN
3.0000 mL | Freq: Once | RESPIRATORY_TRACT | Status: AC
  Administered 2016-02-06: 3 mL via RESPIRATORY_TRACT
  Filled 2016-02-06: qty 3

## 2016-02-06 MED ORDER — OSELTAMIVIR PHOSPHATE 75 MG PO CAPS
75.0000 mg | ORAL_CAPSULE | Freq: Two times a day (BID) | ORAL | Status: DC
  Administered 2016-02-07 – 2016-02-08 (×3): 75 mg via ORAL
  Filled 2016-02-06 (×4): qty 1

## 2016-02-06 MED ORDER — IOPAMIDOL (ISOVUE-370) INJECTION 76%
INTRAVENOUS | Status: AC
  Filled 2016-02-06: qty 100

## 2016-02-06 MED ORDER — OXYCODONE HCL 5 MG PO TABS: 5.0000 mg | ORAL_TABLET | ORAL | Status: DC | PRN

## 2016-02-06 MED ORDER — OSELTAMIVIR PHOSPHATE 75 MG PO CAPS
75.0000 mg | ORAL_CAPSULE | Freq: Every day | ORAL | Status: DC
  Administered 2016-02-06: 75 mg via ORAL
  Filled 2016-02-06: qty 1

## 2016-02-06 MED ORDER — LEVOFLOXACIN IN D5W 750 MG/150ML IV SOLN
750.0000 mg | INTRAVENOUS | Status: DC
Start: 2016-02-07 — End: 2016-02-06

## 2016-02-06 MED ORDER — POTASSIUM CHLORIDE IN NACL 20-0.9 MEQ/L-% IV SOLN: INTRAVENOUS | Status: DC

## 2016-02-06 MED ORDER — SODIUM CHLORIDE 0.9 % IV BOLUS (SEPSIS)
1000.0000 mL | Freq: Once | INTRAVENOUS | Status: AC
  Administered 2016-02-06: 1000 mL via INTRAVENOUS

## 2016-02-06 MED ORDER — VANCOMYCIN HCL 10 G IV SOLR
2500.0000 mg | Freq: Once | INTRAVENOUS | Status: DC
  Filled 2016-02-06: qty 2500

## 2016-02-06 MED ORDER — VANCOMYCIN HCL IN DEXTROSE 1-5 GM/200ML-% IV SOLN: 1000.0000 mg | Freq: Three times a day (TID) | INTRAVENOUS | Status: DC

## 2016-02-06 MED ORDER — DEXTROSE 5 % IV SOLN
2.0000 g | Freq: Once | INTRAVENOUS | Status: AC
  Administered 2016-02-06: 2 g via INTRAVENOUS
  Filled 2016-02-06: qty 2

## 2016-02-06 MED ORDER — ONDANSETRON HCL 4 MG PO TABS: 4.0000 mg | ORAL_TABLET | Freq: Four times a day (QID) | ORAL | Status: DC | PRN

## 2016-02-06 MED ORDER — SODIUM CHLORIDE 0.9 % IV SOLN
Freq: Once | INTRAVENOUS | Status: AC
  Administered 2016-02-06: 20:00:00 via INTRAVENOUS

## 2016-02-06 MED ORDER — ACETAMINOPHEN 325 MG PO TABS
650.0000 mg | ORAL_TABLET | Freq: Once | ORAL | Status: AC | PRN
  Administered 2016-02-06: 325 mg via ORAL
  Filled 2016-02-06: qty 2

## 2016-02-06 MED ORDER — DEXTROSE 5 % IV SOLN: 1.0000 g | Freq: Three times a day (TID) | INTRAVENOUS | Status: DC

## 2016-02-06 MED ORDER — MAGNESIUM SULFATE IN D5W 1-5 GM/100ML-% IV SOLN
1.0000 g | Freq: Once | INTRAVENOUS | Status: AC
  Administered 2016-02-06: 1 g via INTRAVENOUS
  Filled 2016-02-06: qty 100

## 2016-02-06 MED ORDER — PREDNISONE 50 MG PO TABS
50.0000 mg | ORAL_TABLET | Freq: Every day | ORAL | Status: DC
  Administered 2016-02-06 – 2016-02-08 (×2): 50 mg via ORAL
  Filled 2016-02-06 (×3): qty 1

## 2016-02-06 MED ORDER — SODIUM CHLORIDE 0.9 % IV SOLN
INTRAVENOUS | Status: AC
  Administered 2016-02-06: 22:00:00 via INTRAVENOUS
  Filled 2016-02-06: qty 1000

## 2016-02-06 MED ORDER — IPRATROPIUM-ALBUTEROL 0.5-2.5 (3) MG/3ML IN SOLN
3.0000 mL | Freq: Once | RESPIRATORY_TRACT | Status: AC
  Administered 2016-02-06: 3 mL via RESPIRATORY_TRACT

## 2016-02-06 MED ORDER — ACETAMINOPHEN 325 MG PO TABS
650.0000 mg | ORAL_TABLET | Freq: Four times a day (QID) | ORAL | Status: DC | PRN
  Administered 2016-02-07 – 2016-02-08 (×2): 650 mg via ORAL
  Filled 2016-02-06 (×3): qty 2

## 2016-02-06 MED ORDER — ENOXAPARIN SODIUM 40 MG/0.4ML ~~LOC~~ SOLN
40.0000 mg | Freq: Every day | SUBCUTANEOUS | Status: DC
  Administered 2016-02-06 – 2016-02-07 (×2): 40 mg via SUBCUTANEOUS
  Filled 2016-02-06 (×2): qty 0.4

## 2016-02-06 MED ORDER — POTASSIUM CHLORIDE CRYS ER 20 MEQ PO TBCR
20.0000 meq | EXTENDED_RELEASE_TABLET | Freq: Once | ORAL | Status: AC
  Administered 2016-02-06: 20 meq via ORAL
  Filled 2016-02-06: qty 1

## 2016-02-06 MED ORDER — DEXTROSE 5 % IV SOLN: 2.0000 g | Freq: Once | INTRAVENOUS | Status: DC

## 2016-02-06 MED ORDER — POLYETHYLENE GLYCOL 3350 17 G PO PACK: 17.0000 g | PACK | Freq: Every day | ORAL | Status: DC | PRN

## 2016-02-06 NOTE — H&P (Signed)
History and Physical    VIVA GALLAHER QMV:[id number] DOB: [birth year] DOA: 02/06/2016  PCP: Jeanine Luz, FNP   Patient coming from: Home  Chief Complaint: Chest tightness, SOB, wheezing, malaise, aches  HPI: Debra Horn is a 31 y.o. female with medical history significant for moderate persistent asthma, iron deficiency anemia, and history of pulmonary embolism who presents to the emergency department for evaluation of malaise, aches, dyspnea, chest tightness, and cough that developed last night. Patient reports that she had been in her usual state of health until last night when she noted a general sense of malaise and chills. She also was suffering from achy pain in the back, shoulders, and legs. There was a mild sore throat and mild rhinorrhea associated with this. There was some mild dyspnea last night with a cough, but this morning her respiratory status was much worse and she was dyspneic at rest, experiencing chest tightness, and wheezing. She was using her albuterol with diminishing results. She denies lower extremity swelling or tenderness, denies hemoptysis, denies prolonged immobilization or recent long distance travel, and denies syncope. Patient lives with a nephew who has been sick with fevers and cough. There is been no recent long distance travel. She did not receive the influenza vaccine this year.   ED Course: Upon arrival to the ED, patient is found to be febrile to 30 9.5C, saturating well on room air, tachycardic in the 130s, and with initial blood pressure of 82/47. EKG features a sinus tachycardia with rate 135, PA-C, and nonspecific T-wave abnormality. Chest x-ray is negative for acute cardiopulmonary disease, chemistry panels notable for mild hypokalemia, and CBC features a stable normocytic anemia with hemoglobin 10.9. Lactic acid is reassuring at 1.39, INR is within normal limits, troponin is undetectable, and urinalysis is notable only for an increased specific  gravity. CTA PE study was obtained in the ED and negative for PE, but notable for airway thickening/bronchitis. Blood and urine cultures were obtained, 2 L of normal saline were administered, and the patient was started on empiric vancomycin, Levaquin, and Azactam. She was treated with multiple DuoNeb's. Influenza swab returned positive for influenza A and she was given Tamiflu. Tachycardia and blood pressure improved with fluids, and respirations improved with the nebulized therapies, but she continued to develop recurrence in dyspnea at rest shortly after the nebs were completed. She will be observed on the medical-surgical unit for ongoing evaluation and management of acute exacerbation and asthma, secondary to influenza A.  Review of Systems:  All other systems reviewed and apart from HPI, are negative.  Past Medical History:  Diagnosis Date  . Anemia    low iron  . Anxiety   . Asthma   . Complication of anesthesia    bp dropped in middle of last c-section and started with n/v  . GERD (gastroesophageal reflux disease)    during pregnancy  . Headache    "couple days/week" (10/28/2015)  . Hyperthyroidism    not on medication  . PE (pulmonary embolism) 11/2011  . Pneumonia   . Preeclampsia   . Preeclampsia   . Pregnancy induced hypertension   . Preterm labor     Past Surgical History:  Procedure Laterality Date  . CESAREAN SECTION  2008; 2014  . FRACTURE SURGERY    . ORIF ANKLE FRACTURE Right 10/28/2015  . ORIF ANKLE FRACTURE Right 10/28/2015   Procedure: OPEN REDUCTION INTERNAL FIXATION (ORIF) RIGHT ANKLE FRACTURE;  Surgeon: Yolonda Kida, MD;  [facility];  Service:  Orthopedics;  Laterality: Right;     reports that she has never smoked. She has never used smokeless tobacco. She reports that she does not drink alcohol or use drugs.  Allergies  Allergen Reactions  . Banana Anaphylaxis  . Cefixime Rash    Family History  Problem Relation Age of Onset  .  Hypertension Maternal Grandfather   . Hypertension Paternal Grandmother   . Cancer Paternal Grandmother   . Hyperlipidemia Paternal Grandmother   . Hypertension Father   . Cancer Sister     skin  . Cancer Maternal Grandmother   . Rheum arthritis Mother   . Lupus Mother   . Hypothyroidism Maternal Aunt   . Lupus Other      Prior to Admission medications   Medication Sig Start Date End Date Taking? Authorizing Provider  albuterol (PROVENTIL HFA;VENTOLIN HFA) 108 (90 Base) MCG/ACT inhaler Inhale 1-2 puffs into the lungs every 6 (six) hours as needed for wheezing or shortness of breath.   Yes Historical Provider, MD  albuterol (PROVENTIL) (2.5 MG/3ML) 0.083% nebulizer solution Take 2.5 mg by nebulization every 6 (six) hours as needed for wheezing or shortness of breath.   Yes Historical Provider, MD  ibuprofen (ADVIL,MOTRIN) 200 MG tablet Take 400 mg by mouth every 6 (six) hours as needed for headache, mild pain or moderate pain.   Yes Historical Provider, MD  oxyCODONE (ROXICODONE) 5 MG immediate release tablet Take 1 tablet (5 mg total) by mouth every 4 (four) hours as needed for severe pain. 10/20/15  Yes Barrett Henle, [place]    Physical Exam: Vitals:   02/06/16 1538 02/06/16 1545 02/06/16 1547 02/06/16 1930  BP:  143/62 143/62 126/80  Pulse:  (!) 127 (!) 133 111  Resp:  22 20 18   Temp:   99 F (37.2 C)   TempSrc:   Oral   SpO2: 97% 99% 99% 100%  Weight:      Height:          Constitutional: Sitting up straight with mild accessory muscle recruitment. No pallor or diaphoresis.  Eyes: PERTLA, lids and conjunctivae normal ENMT: Mucous membranes are moist. Posterior pharynx clear of any exudate or lesions.   Neck: normal, supple, no masses, no thyromegaly Respiratory: Diminished bilaterally with prolonged expiratory phase and wheezing. No rhonchi.    Cardiovascular: Rate ~120 and regular. No appreciable murmur or gallop. No significant JVD. Abdomen: No distension, no  tenderness, no masses palpated. Bowel sounds normal.  Musculoskeletal: no clubbing / cyanosis. No joint deformity upper and lower extremities. Normal muscle tone.  Skin: no significant rashes, lesions, ulcers. Warm, dry, well-perfused. Neurologic: CN 2-12 grossly intact. Sensation intact, DTR normal. Strength 5/5 in all 4 limbs.  Psychiatric: Normal judgment and insight. Alert and oriented x 3. Normal mood and affect.     Labs on Admission: I have personally reviewed following labs and imaging studies  CBC:  Recent Labs Lab 02/06/16 1347  WBC 6.2  HGB 10.9*  HCT 33.5*  MCV 82.9  PLT 385   Basic Metabolic Panel:  Recent Labs Lab 02/06/16 1347  NA 137  K 3.3*  CL 106  CO2 23  GLUCOSE 91  BUN 9  CREATININE 0.80  CALCIUM 9.1   GFR: Estimated Creatinine Clearance: 124.7 mL/min (by C-G formula based on SCr of 0.8 mg/dL). Liver Function Tests:  Recent Labs Lab 02/06/16 1347  AST 25  ALT 21  ALKPHOS 88  BILITOT 0.4  PROT 8.2*  ALBUMIN 4.0   No  results for input(s): LIPASE, AMYLASE in the last 168 hours. No results for input(s): AMMONIA in the last 168 hours. Coagulation Profile:  Recent Labs Lab 02/06/16 1347  INR 1.08   Cardiac Enzymes: No results for input(s): CKTOTAL, CKMB, CKMBINDEX, TROPONINI in the last 168 hours. BNP (last 3 results) No results for input(s): PROBNP in the last 8760 hours. HbA1C: No results for input(s): HGBA1C in the last 72 hours. CBG: No results for input(s): GLUCAP in the last 168 hours. Lipid Profile: No results for input(s): CHOL, HDL, LDLCALC, TRIG, CHOLHDL, LDLDIRECT in the last 72 hours. Thyroid Function Tests: No results for input(s): TSH, T4TOTAL, FREET4, T3FREE, THYROIDAB in the last 72 hours. Anemia Panel: No results for input(s): VITAMINB12, FOLATE, FERRITIN, TIBC, IRON, RETICCTPCT in the last 72 hours. Urine analysis:    Component Value Date/Time   COLORURINE YELLOW 02/06/2016 1655   APPEARANCEUR CLEAR  02/06/2016 1655   LABSPEC >1.046 (H) 02/06/2016 1655   PHURINE 6.0 02/06/2016 1655   GLUCOSEU NEGATIVE 02/06/2016 1655   HGBUR NEGATIVE 02/06/2016 1655   BILIRUBINUR NEGATIVE 02/06/2016 1655   KETONESUR 5 (A) 02/06/2016 1655   PROTEINUR NEGATIVE 02/06/2016 1655   UROBILINOGEN 1.0 12/18/2013 0353   NITRITE NEGATIVE 02/06/2016 1655   LEUKOCYTESUR NEGATIVE 02/06/2016 1655   Sepsis Labs: @LABRCNTIP (procalcitonin:4,lacticidven:4) )No results found for this or any previous visit (from the past 240 hour(s)).   Radiological Exams on Admission: Dg Chest 2 View  Result Date: 02/06/2016 CLINICAL DATA:  Chest pain EXAM: CHEST  2 VIEW COMPARISON:  09/01/2015 FINDINGS: Normal heart size and mediastinal contours. No acute infiltrate or edema. No effusion or pneumothorax. No acute osseous findings. IMPRESSION: Negative chest. Electronically Signed   By: Marnee Spring M.D.   On: 02/06/2016 14:12   Ct Angio Chest Pe W And/or Wo Contrast  Result Date: 02/06/2016 CLINICAL DATA:  Shortness of breath and chest pain.  Tachycardia. EXAM: CT ANGIOGRAPHY CHEST WITH CONTRAST TECHNIQUE: Multidetector CT imaging of the chest was performed using the standard protocol during bolus administration of intravenous contrast. Multiplanar CT image reconstructions and MIPs were obtained to evaluate the vascular anatomy. CONTRAST:  100 cc Isovue 370 intravenous COMPARISON:  09/02/2015 FINDINGS: Cardiovascular: Satisfactory opacification of the pulmonary arteries to the segmental level. When accounting for intermittent motion there is no evidence of pulmonary embolism. Normal heart size. No pericardial effusion. Mediastinum/Nodes: Negative for adenopathy or mass. Minimal sliding hiatal hernia. Lungs/Pleura: Generalized airway thickening. There is no edema, consolidation, effusion, or pneumothorax. Upper Abdomen: Negative Musculoskeletal: Negative Review of the MIP images confirms the above findings. IMPRESSION: 1. Negative for  pulmonary embolism. 2. Airway thickening/bronchitis Electronically Signed   By: Marnee Spring M.D.   On: 02/06/2016 16:34    EKG: Independently reviewed. Sinus tachycardia (rate 135), PAC, non-specific T-wave abnormality in inferolateral and anterior leads.   Assessment/Plan  1. Acute asthma exacerbation, secondary to influenza A - Pt has hx of moderate-persistent asthma - She developed malaise, aches, chills the night prior to admit, then exacerbation in her asthma worsening on 02/06/16 despite her use of home treatments  - CXR clear; CTA neg for PE, notable for airway thickening; no suggestion of CHF  - She responded well initially to neb treatments in ED, but dyspnea at rest and wheezing returns within an hr  - Asthma exacerbation likely precipitated by influenza  - Continue nebs, Tamiflu, start prednisone   2. SIRS  - Presents with fever, tachycardia and soft BP  - Code sepsis was initiated in ED;  cultures were obtained, she was fluid-resuscitated, and started on empiric broad spectrums  - Lactic acid has been reassuring, there is no leukocytosis, and her presentation is explained by influenza  - Continue Tamiflu as above, follow cultures, watch off of antibiotics   3. Hypokalemia  - Serum potassium 3.3 on admission; albuterol may be responsible  - She was given 20 mEq oral potassium and 1 g IV magnesium on admission  - Repeat chem panel in am   4. Normocytic anemia  - Hgb is 10.9 on admission  - This is stable relative to priors, attributed to iron-deficiency, and with no active blood-loss    DVT prophylaxis: sq Lovenox  Code Status: Full  Family Communication: Discussed with patient Disposition Plan: Observe on med-surg Consults called: None Admission status: Observation    Briscoe Deutscherimothy S Royale Swamy, MD [facility] Pager [phone]  If 7PM-7AM, please contact night-coverage [email or web address] Password [email or web address]  02/06/2016, 8:10 PM

## 2016-02-06 NOTE — ED Notes (Signed)
Patient transported to X-ray 

## 2016-02-06 NOTE — Progress Notes (Signed)
Pharmacy Antibiotic Follow-up Note  Debra Horn is a 31 y.o. year-old female admitted on 02/06/2016.  The patient is currently on day 1 of Vancomycin, Cefepime, Levaquin for rule out sepsis. CXray negative for infiltrate or effusion.  Assessment/Plan: - Vancomycin 2500mg  x1 in ED, followed by 1gm q8hr - Cefepime 2gm x1, then 1gm q8 - Levaquin 750mg  IV q24  Temp (24hrs), Avg:103 F (39.4 C), Min:103 F (39.4 C), Max:103 F (39.4 C)   Recent Labs Lab 02/06/16 1347  WBC 6.2    Recent Labs Lab 02/06/16 1347  CREATININE 0.80   Estimated Creatinine Clearance: 124.7 mL/min (by C-G formula based on SCr of 0.8 mg/dL).    Allergies  Allergen Reactions  . Banana Anaphylaxis  . Cefixime Rash   Antimicrobials this admission: 2/4 Vancomycin >>  2/4 Cefepime >>  2/4 Levaquin >>  Levels/dose changes this admission:  Microbiology results: 2/4 BCx: sent 2/4 Resp panel PCR: sent   Thank you for allowing pharmacy to be a part of this patient's care.  Otho BellowsGreen, Shigeo Baugh L PharmD 02/06/2016 2:49 PM

## 2016-02-06 NOTE — ED Triage Notes (Signed)
Patient c/o SOB and cough since last night with chest pain and back pain. Patient has PMH PE.  patient not on any blood thinners.

## 2016-02-06 NOTE — ED Notes (Signed)
Patient transported to CT 

## 2016-02-06 NOTE — ED Provider Notes (Signed)
Emergency Department Provider Note   I have reviewed the triage vital signs and the nursing notes.   HISTORY  Chief Complaint Asthma; Chest Pain; and Cough   HPI Debra Horn is a 31 y.o. female with PMH of asthma, GERD, and prior PE not anticoagulated who presents to the emergency department for evaluation of fever, difficulty breathing, right-sided chest pain in the setting of recent flu contact. Patient was having difficulty breathing yesterday along with chest pain and tried treating with home albuterol inhaler and various home remedies. She did not have a thickened success and difficulty sleeping overnight. Symptoms continued this morning she decided to present to the emergency department. She does have a family member who is sick with the flu began having some muscle aching and currently has fever. She notes a history of pulmonary embolism but was taken off anticoagulation 3 years ago. No clear provoking factor during that episode. Her chest pain is dull, right-sided, radiating to her back. Pain is not exertional.  Past Medical History:  Diagnosis Date  . Anemia    low iron  . Anxiety   . Asthma   . Complication of anesthesia    bp dropped in middle of last c-section and started with n/v  . GERD (gastroesophageal reflux disease)    during pregnancy  . Headache    "couple days/week" (10/28/2015)  . Hyperthyroidism    not on medication  . PE (pulmonary embolism) 11/2011  . Pneumonia   . Preeclampsia   . Preeclampsia   . Pregnancy induced hypertension   . Preterm labor     Patient Active Problem List   Diagnosis Date Noted  . Influenza A 02/06/2016  . Normocytic anemia 02/06/2016  . Hypokalemia 02/06/2016  . SIRS (systemic inflammatory response syndrome) (HCC) 02/06/2016  . Ankle fracture, lateral malleolus, closed 10/28/2015  . Dizziness 02/19/2014  . Family history of systemic lupus erythematosus (SLE) in mother 02/19/2014  . Asthma exacerbation 02/19/2014    . Hyperthyroidism 02/19/2014  . Preterm NB deliv by C-section, 1,750-1,999 gm, 31-32 completed weeks 08/08/2012  . Previous cesarean section 08/08/2012  . Hx pulmonary embolism 07/24/2012  . Supervision of high-risk pregnancy 07/24/2012    Past Surgical History:  Procedure Laterality Date  . CESAREAN SECTION  2008; 2014  . FRACTURE SURGERY    . ORIF ANKLE FRACTURE Right 10/28/2015  . ORIF ANKLE FRACTURE Right 10/28/2015   Procedure: OPEN REDUCTION INTERNAL FIXATION (ORIF) RIGHT ANKLE FRACTURE;  Surgeon: Yolonda KidaJason Patrick Rogers, MD;  Location: [facility] OR;  Service: Orthopedics;  Laterality: Right;      Allergies Banana and Cefixime  Family History  Problem Relation Age of Onset  . Hypertension Maternal Grandfather   . Hypertension Paternal Grandmother   . Cancer Paternal Grandmother   . Hyperlipidemia Paternal Grandmother   . Hypertension Father   . Cancer Sister     skin  . Cancer Maternal Grandmother   . Rheum arthritis Mother   . Lupus Mother   . Hypothyroidism Maternal Aunt   . Lupus Other     Social History Social History  Substance Use Topics  . Smoking status: Never Smoker  . Smokeless tobacco: Never Used  . Alcohol use No    Review of Systems  Constitutional: Positive fever/chills Eyes: No visual changes. ENT: No sore throat. Cardiovascular: Positive chest pain. Respiratory: Positive shortness of breath. Gastrointestinal: No abdominal pain.  No nausea, no vomiting.  No diarrhea.  No constipation. Genitourinary: Negative for dysuria. Musculoskeletal: Negative for  back pain. Skin: Negative for rash. Neurological: Negative for headaches, focal weakness or numbness.  10-point ROS otherwise negative.  ____________________________________________   PHYSICAL EXAM:  VITAL SIGNS: ED Triage Vitals  Enc Vitals Group     BP 02/06/16 1331 94/84     Pulse Rate 02/06/16 1331 (!) 140     Resp 02/06/16 1331 26     Temp 02/06/16 1331 103 F (39.4 C)     Temp  Source 02/06/16 1331 Oral     SpO2 02/06/16 1331 97 %     Weight 02/06/16 1416 250 lb (113.4 kg)     Height 02/06/16 1336 5\' 3"  (1.6 m)     Pain Score 02/06/16 1336 10   Constitutional: Alert and oriented. Well appearing and in no acute distress. Eyes: Conjunctivae are normal.  Head: Atraumatic. Nose: No congestion/rhinnorhea. Mouth/Throat: Mucous membranes are moist.  Oropharynx non-erythematous. Neck: No stridor.   Cardiovascular: Tachycardia. Good peripheral circulation. Grossly normal heart sounds.   Respiratory: Increased respiratory effort.  No retractions. Lungs diminished throughout with no expiratory wheezing.  Gastrointestinal: Soft and nontender. No distention.  Musculoskeletal: No lower extremity tenderness nor edema. No gross deformities of extremities. Neurologic:  Normal speech and language. No gross focal neurologic deficits are appreciated.  Skin:  Skin is warm, dry and intact. No rash noted.   ____________________________________________   LABS (all labs ordered are listed, but only abnormal results are displayed)  Labs Reviewed  RESPIRATORY PANEL BY PCR - Abnormal; Notable for the following:       Result Value   Influenza A H3 DETECTED (*)    All other components within normal limits  CBC - Abnormal; Notable for the following:    Hemoglobin 10.9 (*)    HCT 33.5 (*)    All other components within normal limits  COMPREHENSIVE METABOLIC PANEL - Abnormal; Notable for the following:    Potassium 3.3 (*)    Total Protein 8.2 (*)    All other components within normal limits  URINALYSIS, ROUTINE W REFLEX MICROSCOPIC - Abnormal; Notable for the following:    Specific Gravity, Urine >1.046 (*)    Ketones, ur 5 (*)    All other components within normal limits  INFLUENZA PANEL BY PCR (TYPE A & B) - Abnormal; Notable for the following:    Influenza A By PCR POSITIVE (*)    All other components within normal limits  BASIC METABOLIC PANEL - Abnormal; Notable for the  following:    Glucose, Bld 139 (*)    Calcium 8.8 (*)    All other components within normal limits  GLUCOSE, CAPILLARY - Abnormal; Notable for the following:    Glucose-Capillary 125 (*)    All other components within normal limits  CULTURE, BLOOD (ROUTINE X 2)  CULTURE, BLOOD (ROUTINE X 2)  URINE CULTURE  PROTIME-INR  HCG, QUANTITATIVE, PREGNANCY  MAGNESIUM  I-STAT TROPOININ, ED  I-STAT CG4 LACTIC ACID, ED  I-STAT BETA HCG BLOOD, ED ([facility], [facility], [facility] ONLY)  I-STAT CG4 LACTIC ACID, ED   ____________________________________________  EKG   EKG Interpretation  Date/Time:  Sunday February 06 2016 13:35:56 EST Ventricular Rate:  135 PR Interval:    QRS Duration: 77 QT Interval:  258 QTC Calculation: 387 R Axis:   81 Text Interpretation:  Sinus tachycardia Atrial premature complex Abnormal T, consider ischemia, diffuse leads No STEMI.  Confirmed by LONG MD, JOSHUA [phone]) on 02/06/2016 2:35:46 PM       ____________________________________________  RADIOLOGY  Dg Chest  2 View  Result Date: 02/06/2016 CLINICAL DATA:  Chest pain EXAM: CHEST  2 VIEW COMPARISON:  09/01/2015 FINDINGS: Normal heart size and mediastinal contours. No acute infiltrate or edema. No effusion or pneumothorax. No acute osseous findings. IMPRESSION: Negative chest. Electronically Signed   By: Marnee Spring M.D.   On: 02/06/2016 14:12   Ct Angio Chest Pe W And/or Wo Contrast  Result Date: 02/06/2016 CLINICAL DATA:  Shortness of breath and chest pain.  Tachycardia. EXAM: CT ANGIOGRAPHY CHEST WITH CONTRAST TECHNIQUE: Multidetector CT imaging of the chest was performed using the standard protocol during bolus administration of intravenous contrast. Multiplanar CT image reconstructions and MIPs were obtained to evaluate the vascular anatomy. CONTRAST:  100 cc Isovue 370 intravenous COMPARISON:  09/02/2015 FINDINGS: Cardiovascular: Satisfactory opacification of the pulmonary arteries to the segmental level. When accounting  for intermittent motion there is no evidence of pulmonary embolism. Normal heart size. No pericardial effusion. Mediastinum/Nodes: Negative for adenopathy or mass. Minimal sliding hiatal hernia. Lungs/Pleura: Generalized airway thickening. There is no edema, consolidation, effusion, or pneumothorax. Upper Abdomen: Negative Musculoskeletal: Negative Review of the MIP images confirms the above findings. IMPRESSION: 1. Negative for pulmonary embolism. 2. Airway thickening/bronchitis Electronically Signed   By: Marnee Spring M.D.   On: 02/06/2016 16:34    ____________________________________________   PROCEDURES  Procedure(s) performed:   Procedures  CRITICAL CARE Performed by: Maia Plan Total critical care time: 30 minutes Critical care time was exclusive of separately billable procedures and treating other patients. Critical care was necessary to treat or prevent imminent or life-threatening deterioration. Critical care was time spent personally by me on the following activities: development of treatment plan with patient and/or surrogate as well as nursing, discussions with consultants, evaluation of patient's response to treatment, examination of patient, obtaining history from patient or surrogate, ordering and performing treatments and interventions, ordering and review of laboratory studies, ordering and review of radiographic studies, pulse oximetry and re-evaluation of patient's condition.  Alona Bene, MD Emergency Medicine  ____________________________________________   INITIAL IMPRESSION / ASSESSMENT AND PLAN / ED COURSE  Pertinent labs & imaging results that were available during my care of the patient were reviewed by me and considered in my medical decision making (see chart for details).  Patient resents to the emergency department for evaluation of chest pain, difficulty breathing, fever, and flulike symptoms. She has significant sinus tachycardia without prior  albuterol treatments along with right-sided chest pain. Chest x-ray with no obvious pneumonia. Clinically suspect flu with known contact with the positive individual and fever here today. The patient's chest pain is concerning given her history of pulmonary embolism and no current anticoagulation. Will begin IV fluid hydration, fever control, we'll give albuterol with tightness in his asthma may be a small component here, and obtain a CT scan of the chest to evaluate for pulmonary embolism.   On re-evaluation the patient has continued difficulty breathing. Flu pending. Abx given as patient looks poorly and is meeting multiple SIRS criteria. HR remains elevated and lung sounds diminished.   Flu resulted positive. No further abx. With continued SOB after multiple Duonebs will admit to hospitalist.   Discussed patient's case with hospitalist, Dr. Antionette Char.  Recommend admission to med-surg, obs bed.  I will place holding orders per their request. Patient and family (if present) updated with plan. Care transferred to hospitalist service.  I reviewed all nursing notes, vitals, pertinent old records, EKGs, labs, imaging (as available).  ____________________________________________  FINAL CLINICAL IMPRESSION(S) / ED  DIAGNOSES  Final diagnoses:  Moderate asthma with exacerbation, unspecified whether persistent  Influenza A     MEDICATIONS GIVEN DURING THIS VISIT:  Medications  iopamidol (ISOVUE-370) 76 % injection (not administered)  oxyCODONE (Oxy IR/ROXICODONE) immediate release tablet 5-10 mg (not administered)  enoxaparin (LOVENOX) injection 40 mg (40 mg Subcutaneous Given 02/06/16 2200)  acetaminophen (TYLENOL) tablet 650 mg (650 mg Oral Given 02/07/16 0818)    Or  acetaminophen (TYLENOL) suppository 650 mg ( Rectal See Alternative 02/07/16 0818)  ondansetron (ZOFRAN) tablet 4 mg (not administered)    Or  ondansetron (ZOFRAN) injection 4 mg (not administered)  polyethylene glycol (MIRALAX /  GLYCOLAX) packet 17 g (not administered)  albuterol (PROVENTIL) (2.5 MG/3ML) 0.083% nebulizer solution 2.5 mg (not administered)  predniSONE (DELTASONE) tablet 50 mg (50 mg Oral Given 02/06/16 2040)  oseltamivir (TAMIFLU) capsule 75 mg (75 mg Oral Given 02/07/16 0819)  sodium chloride 0.9 % 1,000 mL with potassium chloride 20 mEq infusion ( Intravenous New Bag/Given 02/06/16 2200)  gi cocktail (Maalox,Lidocaine,Donnatal) (not administered)  acetaminophen (TYLENOL) tablet 650 mg (325 mg Oral Given 02/06/16 1343)  acetaminophen (TYLENOL) tablet 325 mg (325 mg Oral Given 02/06/16 1432)  ipratropium-albuterol (DUONEB) 0.5-2.5 (3) MG/3ML nebulizer solution 3 mL (3 mLs Nebulization Given 02/06/16 1538)  sodium chloride 0.9 % bolus 1,000 mL (0 mLs Intravenous Stopped 02/06/16 1532)  levofloxacin (LEVAQUIN) IVPB 750 mg (0 mg Intravenous Stopped 02/06/16 1705)  iopamidol (ISOVUE-370) 76 % injection (100 mLs  Contrast Given 02/06/16 1456)  ceFEPIme (MAXIPIME) 2 g in dextrose 5 % 50 mL IVPB (0 g Intravenous Stopped 02/06/16 1759)  ipratropium-albuterol (DUONEB) 0.5-2.5 (3) MG/3ML nebulizer solution 3 mL (3 mLs Nebulization Given 02/06/16 1752)  sodium chloride 0.9 % bolus 1,000 mL (0 mLs Intravenous Stopped 02/06/16 1803)  ipratropium-albuterol (DUONEB) 0.5-2.5 (3) MG/3ML nebulizer solution 3 mL (3 mLs Nebulization Given 02/06/16 1958)  0.9 %  sodium chloride infusion ( Intravenous Stopped 02/06/16 2057)  potassium chloride SA (K-DUR,KLOR-CON) CR tablet 20 mEq (20 mEq Oral Given 02/06/16 2040)  magnesium sulfate IVPB 1 g 100 mL (1 g Intravenous Given 02/06/16 2200)     NEW OUTPATIENT MEDICATIONS STARTED DURING THIS VISIT:  None   Note:  This document was prepared using Dragon voice recognition software and may include unintentional dictation errors.  Alona Bene, MD Emergency Medicine  Maia Plan, MD 02/07/16 [phone]

## 2016-02-06 NOTE — ED Notes (Signed)
Bed: [id number] Expected date:  Expected time:  Means of arrival:  Comments: Tr 1 possible sepsis

## 2016-02-07 DIAGNOSIS — R6510 Systemic inflammatory response syndrome (SIRS) of non-infectious origin without acute organ dysfunction: Secondary | ICD-10-CM | POA: Diagnosis present

## 2016-02-07 DIAGNOSIS — J4521 Mild intermittent asthma with (acute) exacerbation: Secondary | ICD-10-CM | POA: Diagnosis not present

## 2016-02-07 DIAGNOSIS — J101 Influenza due to other identified influenza virus with other respiratory manifestations: Secondary | ICD-10-CM | POA: Diagnosis not present

## 2016-02-07 DIAGNOSIS — Z91018 Allergy to other foods: Secondary | ICD-10-CM | POA: Diagnosis not present

## 2016-02-07 DIAGNOSIS — Z86711 Personal history of pulmonary embolism: Secondary | ICD-10-CM | POA: Diagnosis not present

## 2016-02-07 DIAGNOSIS — J40 Bronchitis, not specified as acute or chronic: Secondary | ICD-10-CM | POA: Diagnosis present

## 2016-02-07 DIAGNOSIS — D509 Iron deficiency anemia, unspecified: Secondary | ICD-10-CM | POA: Diagnosis present

## 2016-02-07 DIAGNOSIS — E876 Hypokalemia: Secondary | ICD-10-CM | POA: Diagnosis present

## 2016-02-07 DIAGNOSIS — Z79899 Other long term (current) drug therapy: Secondary | ICD-10-CM | POA: Diagnosis not present

## 2016-02-07 DIAGNOSIS — D6489 Other specified anemias: Secondary | ICD-10-CM | POA: Diagnosis present

## 2016-02-07 DIAGNOSIS — Z881 Allergy status to other antibiotic agents status: Secondary | ICD-10-CM | POA: Diagnosis not present

## 2016-02-07 DIAGNOSIS — J4541 Moderate persistent asthma with (acute) exacerbation: Secondary | ICD-10-CM | POA: Diagnosis present

## 2016-02-07 DIAGNOSIS — J45901 Unspecified asthma with (acute) exacerbation: Secondary | ICD-10-CM | POA: Diagnosis not present

## 2016-02-07 LAB — BASIC METABOLIC PANEL
Anion gap: 6 (ref 5–15)
BUN: 6 mg/dL (ref 6–20)
CALCIUM: 8.8 mg/dL — AB (ref 8.9–10.3)
CHLORIDE: 109 mmol/L (ref 101–111)
CO2: 22 mmol/L (ref 22–32)
CREATININE: 0.59 mg/dL (ref 0.44–1.00)
GFR calc non Af Amer: >60 mL/min (ref >60)
GLUCOSE: 139 mg/dL — AB (ref 65–99)
Potassium: 4 mmol/L (ref 3.5–5.1)
Sodium: 137 mmol/L (ref 135–145)

## 2016-02-07 LAB — GLUCOSE, CAPILLARY
GLUCOSE-CAPILLARY: 91 mg/dL (ref 65–99)
Glucose-Capillary: 125 mg/dL — ABNORMAL HIGH (ref 65–99)

## 2016-02-07 LAB — MAGNESIUM: Magnesium: 1.9 mg/dL (ref 1.7–2.4)

## 2016-02-07 MED ORDER — GI COCKTAIL ~~LOC~~: 30.0000 mL | Freq: Three times a day (TID) | ORAL | Status: DC | PRN

## 2016-02-07 NOTE — Progress Notes (Signed)
[facility] PROGRESS NOTE  Luz BrazenLauren A Burget FAO:[id number] DOB: October 03, 1985 DOA: 02/06/2016   PCP: Jeanine Luzalone, Gregory, FNP     Assessment/Plan: Principal Problem:   Asthma exacerbation Active Problems:   Influenza A   Normocytic anemia   Hypokalemia   SIRS (systemic inflammatory response syndrome) (HCC)   Debra Horn is a 31 y.o. female with medical history significant for moderate persistent asthma, iron deficiency anemia, and history of pulmonary embolism who presents to the emergency department for evaluation of malaise, aches, dyspnea, chest tightness, and cough that developed last night.CTA PE study was obtained in the ED and negative for PE, but notable for airway thickening/bronchitis. Blood and urine cultures were obtained, 2 L of normal saline were administered, and the patient was started on empiric vancomycin, Levaquin, and Azactam. She was treated with multiple DuoNeb's. Influenza swab returned positive for influenza A and she was given Tamiflu. Admitted for asthma exacerbation secondary to influenza A  Assessment and plan  1. Acute asthma exacerbation, secondary to influenza A - Pt has hx of moderate-persistent asthma - She developed malaise, aches, chills the night prior to admit, then exacerbation in her asthma worsening on 02/06/16 despite her use of home treatments  - CXR clear; CTA neg for PE, notable for airway thickening; no suggestion of CHF  - She responded well initially to neb treatments in ED, but dyspnea at rest and wheezing returns within an hr  - Asthma exacerbation likely precipitated by influenza  - Continue nebs, Tamiflu, continue prednisone Check pulse oximetry with ambulation, currently on room air at rest Anticipate discharge in one to 2 days  2. SIRS  - Presents with fever, tachycardia and soft BP  - Code sepsis was initiated in ED; cultures were obtained, she was fluid-resuscitated, and started on empiric broad spectrums . Antibiotics  discontinued due to no evidence of any pneumonia - Lactic acid has been reassuring, there is no leukocytosis, and her presentation is explained by influenza  - Continue Tamiflu as above, follow cultures, watch off of antibiotics   3. Hypokalemia  - Serum potassium 3.3 on admission; albuterol may be responsible . Now repleted    4. Normocytic anemia  - Hgb is 10.9 on admission  - This is stable relative to priors, attributed to iron-deficiency, and with no active blood-loss       DVT prophylaxsis Lovenox  Code Status:  Full code     Family Communication: Discussed in detail with the patient, all imaging results, lab results explained to the patient   Disposition Plan:  1-2 days      Consultants:  None  Procedures:  None  Antibiotics: Anti-infectives    Start     Dose/Rate Route Frequency Ordered Stop   02/07/16 1800  levofloxacin (LEVAQUIN) IVPB 750 mg  Status:  Discontinued     750 mg 100 mL/hr over 90 Minutes Intravenous Every 24 hours 02/06/16 1542 02/06/16 2010   02/07/16 0800  oseltamivir (TAMIFLU) capsule 75 mg     75 mg Oral 2 times daily 02/06/16 2010 02/11/16 2159   02/07/16 0100  vancomycin (VANCOCIN) IVPB 1000 mg/200 mL premix  Status:  Discontinued     1,000 mg 200 mL/hr over 60 Minutes Intravenous Every 8 hours 02/06/16 1542 02/06/16 2010   02/07/16 0000  ceFEPIme (MAXIPIME) 1 g in dextrose 5 % 50 mL IVPB  Status:  Discontinued     1 g 100 mL/hr over 30 Minutes Intravenous Every 8 hours  02/06/16 1542 02/06/16 2010   02/06/16 1945  oseltamivir (TAMIFLU) capsule 75 mg  Status:  Discontinued     75 mg Oral Daily 02/06/16 1936 02/06/16 2021   02/06/16 1600  vancomycin (VANCOCIN) 2,500 mg in sodium chloride 0.9 % 500 mL IVPB  Status:  Discontinued     2,500 mg 250 mL/hr over 120 Minutes Intravenous  Once 02/06/16 1537 02/06/16 2010   02/06/16 1515  ceFEPIme (MAXIPIME) 2 g in dextrose 5 % 50 mL IVPB     2 g 100 mL/hr over 30 Minutes Intravenous   Once 02/06/16 1509 02/06/16 1759   02/06/16 1445  levofloxacin (LEVAQUIN) IVPB 750 mg     750 mg 100 mL/hr over 90 Minutes Intravenous  Once 02/06/16 1438 02/06/16 1705   02/06/16 1445  aztreonam (AZACTAM) 2 g in dextrose 5 % 50 mL IVPB  Status:  Discontinued     2 g 100 mL/hr over 30 Minutes Intravenous  Once 02/06/16 1438 02/06/16 1505   02/06/16 1445  vancomycin (VANCOCIN) IVPB 1000 mg/200 mL premix  Status:  Discontinued     1,000 mg 200 mL/hr over 60 Minutes Intravenous  Once 02/06/16 1438 02/06/16 1536         HPI/Subjective: Still has myalgias , heartburn  Objective: Vitals:   02/06/16 2016 02/06/16 2117 02/07/16 0147 02/07/16 0420  BP: 111/78 99/74  124/69  Pulse: 95 (!) 101  78  Resp: 18 (!) 22  20  Temp:  99 F (37.2 C) 98.6 F (37 C) 98.4 F (36.9 C)  TempSrc:  Oral  Oral  SpO2: 97% 100%  99%  Weight:  96.6 kg (213 lb)    Height:  5\' 3"  (1.6 m)      Intake/Output Summary (Last 24 hours) at 02/07/16 0913 Last data filed at 02/07/16 0539  Gross per 24 hour  Intake              480 ml  Output                0 ml  Net              480 ml    Exam:  Examination:  General exam: Appears calm and comfortable  Respiratory system: Clear to auscultation. Respiratory effort normal. Cardiovascular system: S1 & S2 heard, RRR. No JVD, murmurs, rubs, gallops or clicks. No pedal edema. Gastrointestinal system: Abdomen is nondistended, soft and nontender. No organomegaly or masses felt. Normal bowel sounds heard. Central nervous system: Alert and oriented. No focal neurological deficits. Extremities: Symmetric 5 x 5 power. Skin: No rashes, lesions or ulcers Psychiatry: Judgement and insight appear normal. Mood & affect appropriate.     Data Reviewed: I have personally reviewed following labs and imaging studies  Micro Results Recent Results (from the past 240 hour(s))  Respiratory Panel by PCR     Status: Abnormal   Collection Time: 02/06/16  2:30 PM  Result  Value Ref Range Status   Adenovirus NOT DETECTED NOT DETECTED Final   Coronavirus 229E NOT DETECTED NOT DETECTED Final   Coronavirus HKU1 NOT DETECTED NOT DETECTED Final   Coronavirus NL63 NOT DETECTED NOT DETECTED Final   Coronavirus OC43 NOT DETECTED NOT DETECTED Final   Metapneumovirus NOT DETECTED NOT DETECTED Final   Rhinovirus / Enterovirus NOT DETECTED NOT DETECTED Final   Influenza A H3 DETECTED (A) NOT DETECTED Final   Influenza B NOT DETECTED NOT DETECTED Final   Parainfluenza Virus 1 NOT DETECTED NOT DETECTED  Final   Parainfluenza Virus 2 NOT DETECTED NOT DETECTED Final   Parainfluenza Virus 3 NOT DETECTED NOT DETECTED Final   Parainfluenza Virus 4 NOT DETECTED NOT DETECTED Final   Respiratory Syncytial Virus NOT DETECTED NOT DETECTED Final   Bordetella pertussis NOT DETECTED NOT DETECTED Final   Chlamydophila pneumoniae NOT DETECTED NOT DETECTED Final   Mycoplasma pneumoniae NOT DETECTED NOT DETECTED Final    Comment: Performed at [facility], [address], [place], [place] [address]    Radiology Reports Dg Chest 2 View  Result Date: 02/06/2016 CLINICAL DATA:  Chest pain EXAM: CHEST  2 VIEW COMPARISON:  09/01/2015 FINDINGS: Normal heart size and mediastinal contours. No acute infiltrate or edema. No effusion or pneumothorax. No acute osseous findings. IMPRESSION: Negative chest. Electronically Signed   By: Marnee Spring M.D.   On: 02/06/2016 14:12   Ct Angio Chest Pe W And/or Wo Contrast  Result Date: 02/06/2016 CLINICAL DATA:  Shortness of breath and chest pain.  Tachycardia. EXAM: CT ANGIOGRAPHY CHEST WITH CONTRAST TECHNIQUE: Multidetector CT imaging of the chest was performed using the standard protocol during bolus administration of intravenous contrast. Multiplanar CT image reconstructions and MIPs were obtained to evaluate the vascular anatomy. CONTRAST:  100 cc Isovue 370 intravenous COMPARISON:  09/02/2015 FINDINGS: Cardiovascular: Satisfactory opacification  of the pulmonary arteries to the segmental level. When accounting for intermittent motion there is no evidence of pulmonary embolism. Normal heart size. No pericardial effusion. Mediastinum/Nodes: Negative for adenopathy or mass. Minimal sliding hiatal hernia. Lungs/Pleura: Generalized airway thickening. There is no edema, consolidation, effusion, or pneumothorax. Upper Abdomen: Negative Musculoskeletal: Negative Review of the MIP images confirms the above findings. IMPRESSION: 1. Negative for pulmonary embolism. 2. Airway thickening/bronchitis Electronically Signed   By: Marnee Spring M.D.   On: 02/06/2016 16:34     CBC  Recent Labs Lab 02/06/16 1347  WBC 6.2  HGB 10.9*  HCT 33.5*  PLT 385  MCV 82.9  MCH 27.0  MCHC 32.5  RDW 15.1    Chemistries   Recent Labs Lab 02/06/16 1347 02/07/16 0439  NA 137 137  K 3.3* 4.0  CL 106 109  CO2 23 22  GLUCOSE 91 139*  BUN 9 6  CREATININE 0.80 0.59  CALCIUM 9.1 8.8*  MG  --  1.9  AST 25  --   ALT 21  --   ALKPHOS 88  --   BILITOT 0.4  --    ------------------------------------------------------------------------------------------------------------------ estimated creatinine clearance is 113.8 mL/min (by C-G formula based on SCr of 0.59 mg/dL). ------------------------------------------------------------------------------------------------------------------ No results for input(s): HGBA1C in the last 72 hours. ------------------------------------------------------------------------------------------------------------------ No results for input(s): CHOL, HDL, LDLCALC, TRIG, CHOLHDL, LDLDIRECT in the last 72 hours. ------------------------------------------------------------------------------------------------------------------ No results for input(s): TSH, T4TOTAL, T3FREE, THYROIDAB in the last 72 hours.  Invalid input(s):  FREET3 ------------------------------------------------------------------------------------------------------------------ No results for input(s): VITAMINB12, FOLATE, FERRITIN, TIBC, IRON, RETICCTPCT in the last 72 hours.  Coagulation profile  Recent Labs Lab 02/06/16 1347  INR 1.08    No results for input(s): DDIMER in the last 72 hours.  Cardiac Enzymes No results for input(s): CKMB, TROPONINI, MYOGLOBIN in the last 168 hours.  Invalid input(s): CK ------------------------------------------------------------------------------------------------------------------ Invalid input(s): POCBNP   CBG:  Recent Labs Lab 02/07/16 0815  GLUCAP 125*       Studies: Dg Chest 2 View  Result Date: 02/06/2016 CLINICAL DATA:  Chest pain EXAM: CHEST  2 VIEW COMPARISON:  09/01/2015 FINDINGS: Normal heart size and mediastinal contours. No acute infiltrate or  edema. No effusion or pneumothorax. No acute osseous findings. IMPRESSION: Negative chest. Electronically Signed   By: Marnee Spring M.D.   On: 02/06/2016 14:12   Ct Angio Chest Pe W And/or Wo Contrast  Result Date: 02/06/2016 CLINICAL DATA:  Shortness of breath and chest pain.  Tachycardia. EXAM: CT ANGIOGRAPHY CHEST WITH CONTRAST TECHNIQUE: Multidetector CT imaging of the chest was performed using the standard protocol during bolus administration of intravenous contrast. Multiplanar CT image reconstructions and MIPs were obtained to evaluate the vascular anatomy. CONTRAST:  100 cc Isovue 370 intravenous COMPARISON:  09/02/2015 FINDINGS: Cardiovascular: Satisfactory opacification of the pulmonary arteries to the segmental level. When accounting for intermittent motion there is no evidence of pulmonary embolism. Normal heart size. No pericardial effusion. Mediastinum/Nodes: Negative for adenopathy or mass. Minimal sliding hiatal hernia. Lungs/Pleura: Generalized airway thickening. There is no edema, consolidation, effusion, or pneumothorax.  Upper Abdomen: Negative Musculoskeletal: Negative Review of the MIP images confirms the above findings. IMPRESSION: 1. Negative for pulmonary embolism. 2. Airway thickening/bronchitis Electronically Signed   By: Marnee Spring M.D.   On: 02/06/2016 16:34      Lab Results  Component Value Date   HGBA1C 5.6 02/19/2014   Lab Results  Component Value Date   CREATININE 0.59 02/07/2016       Scheduled Meds: . enoxaparin (LOVENOX) injection  40 mg Subcutaneous QHS  . oseltamivir  75 mg Oral BID  . predniSONE  50 mg Oral Q breakfast   Continuous Infusions:   LOS: 0 days    Time spent: >30 MINS    [facility]  [facility] Pager [phone]. If 7PM-7AM, please contact night-coverage at [email or web address], password [facility] 02/07/2016, 9:13 AM  LOS: 0 days

## 2016-02-08 DIAGNOSIS — E876 Hypokalemia: Secondary | ICD-10-CM

## 2016-02-08 DIAGNOSIS — J4521 Mild intermittent asthma with (acute) exacerbation: Secondary | ICD-10-CM

## 2016-02-08 DIAGNOSIS — Z8709 Personal history of other diseases of the respiratory system: Secondary | ICD-10-CM

## 2016-02-08 DIAGNOSIS — J101 Influenza due to other identified influenza virus with other respiratory manifestations: Principal | ICD-10-CM

## 2016-02-08 DIAGNOSIS — J45901 Unspecified asthma with (acute) exacerbation: Secondary | ICD-10-CM

## 2016-02-08 LAB — GLUCOSE, CAPILLARY: Glucose-Capillary: 81 mg/dL (ref 65–99)

## 2016-02-08 MED ORDER — OSELTAMIVIR PHOSPHATE 75 MG PO CAPS: 75.0000 mg | ORAL_CAPSULE | Freq: Two times a day (BID) | ORAL | 0 refills | Status: AC

## 2016-02-08 MED ORDER — ALBUTEROL SULFATE (2.5 MG/3ML) 0.083% IN NEBU: 2.5000 mg | INHALATION_SOLUTION | Freq: Four times a day (QID) | RESPIRATORY_TRACT | 12 refills | Status: DC | PRN

## 2016-02-08 MED ORDER — PREDNISONE 50 MG PO TABS: 50.0000 mg | ORAL_TABLET | Freq: Every day | ORAL | 0 refills | Status: AC

## 2016-02-08 NOTE — Discharge Summary (Signed)
Physician Discharge Summary  Debra Horn MRN: [id number] DOB/AGE: [birth year] 31 y.o.  PCP: Mauricio Po, FNP   Admit date: 02/06/2016 Discharge date: 02/08/2016  Discharge Diagnoses:    Principal Problem:   Asthma exacerbation Active Problems:   Influenza A   Normocytic anemia   Hypokalemia   SIRS (systemic inflammatory response syndrome) (HCC)   Moderate asthma with exacerbation    Follow-up recommendations Follow-up with PCP in 3-5 days , including all  additional recommended appointments as below Follow-up CBC, CMP in 3-5 days       Discharge Medication List as of 02/08/2016  1:45 PM    START taking these medications   Details  oseltamivir (TAMIFLU) 75 MG capsule Take 1 capsule (75 mg total) by mouth 2 (two) times daily., Starting Tue 02/08/2016, Until Sat 02/12/2016, Normal    predniSONE (DELTASONE) 50 MG tablet Take 1 tablet (50 mg total) by mouth daily with breakfast., Starting Tue 02/08/2016, Until Fri 02/11/2016, Normal      CONTINUE these medications which have CHANGED   Details  albuterol (PROVENTIL) (2.5 MG/3ML) 0.083% nebulizer solution Take 3 mLs (2.5 mg total) by nebulization every 6 (six) hours as needed for wheezing or shortness of breath., Starting Tue 02/08/2016, Normal      CONTINUE these medications which have NOT CHANGED   Details  albuterol (PROVENTIL HFA;VENTOLIN HFA) 108 (90 Base) MCG/ACT inhaler Inhale 1-2 puffs into the lungs every 6 (six) hours as needed for wheezing or shortness of breath., Historical Med    oxyCODONE (ROXICODONE) 5 MG immediate release tablet Take 1 tablet (5 mg total) by mouth every 4 (four) hours as needed for severe pain., Starting Wed 10/20/2015, Print      STOP taking these medications     ibuprofen (ADVIL,MOTRIN) 200 MG tablet          Discharge Condition: Stable   Discharge Instructions Get Medicines reviewed and adjusted: Please take all your medications with you for your next visit with your Primary  MD  Please request your Primary MD to go over all hospital tests and procedure/radiological results at the follow up, please ask your Primary MD to get all Hospital records sent to his/her office.  If you experience worsening of your admission symptoms, develop shortness of breath, life threatening emergency, suicidal or homicidal thoughts you must seek medical attention immediately by calling 911 or calling your MD immediately if symptoms less severe.  You must read complete instructions/literature along with all the possible adverse reactions/side effects for all the Medicines you take and that have been prescribed to you. Take any new Medicines after you have completely understood and accpet all the possible adverse reactions/side effects.   Do not drive when taking Pain medications.   Do not take more than prescribed Pain, Sleep and Anxiety Medications  Special Instructions: If you have smoked or chewed Tobacco in the last 2 yrs please stop smoking, stop any regular Alcohol and or any Recreational drug use.  Wear Seat belts while driving.  Please note  You were cared for by a hospitalist during your hospital stay. Once you are discharged, your primary care physician will handle any further medical issues. Please note that NO REFILLS for any discharge medications will be authorized once you are discharged, as it is imperative that you return to your primary care physician (or establish a relationship with a primary care physician if you do not have one) for your aftercare needs so that they can reassess your need for medications  and monitor your lab values.  Discharge Instructions    Diet - low sodium heart healthy    Complete by:  As directed    Increase activity slowly    Complete by:  As directed        Allergies  Allergen Reactions  . Banana Anaphylaxis  . Cefixime Rash      Disposition: 01-Home or Self Care   Consults:  None   Significant Diagnostic  Studies:  Dg Chest 2 View  Result Date: 02/06/2016 CLINICAL DATA:  Chest pain EXAM: CHEST  2 VIEW COMPARISON:  09/01/2015 FINDINGS: Normal heart size and mediastinal contours. No acute infiltrate or edema. No effusion or pneumothorax. No acute osseous findings. IMPRESSION: Negative chest. Electronically Signed   By: Monte Fantasia M.D.   On: 02/06/2016 14:12   Ct Angio Chest Pe W And/or Wo Contrast  Result Date: 02/06/2016 CLINICAL DATA:  Shortness of breath and chest pain.  Tachycardia. EXAM: CT ANGIOGRAPHY CHEST WITH CONTRAST TECHNIQUE: Multidetector CT imaging of the chest was performed using the standard protocol during bolus administration of intravenous contrast. Multiplanar CT image reconstructions and MIPs were obtained to evaluate the vascular anatomy. CONTRAST:  100 cc Isovue 370 intravenous COMPARISON:  09/02/2015 FINDINGS: Cardiovascular: Satisfactory opacification of the pulmonary arteries to the segmental level. When accounting for intermittent motion there is no evidence of pulmonary embolism. Normal heart size. No pericardial effusion. Mediastinum/Nodes: Negative for adenopathy or mass. Minimal sliding hiatal hernia. Lungs/Pleura: Generalized airway thickening. There is no edema, consolidation, effusion, or pneumothorax. Upper Abdomen: Negative Musculoskeletal: Negative Review of the MIP images confirms the above findings. IMPRESSION: 1. Negative for pulmonary embolism. 2. Airway thickening/bronchitis Electronically Signed   By: Monte Fantasia M.D.   On: 02/06/2016 16:34        Filed Weights   02/06/16 1416 02/06/16 2117  Weight: 113.4 kg (250 lb) 96.6 kg (213 lb)     Microbiology: Recent Results (from the past 240 hour(s))  Culture, blood (Routine x 2)     Status: None (Preliminary result)   Collection Time: 02/06/16  1:47 PM  Result Value Ref Range Status   Specimen Description BLOOD LEFT ANTECUBITAL  Final   Special Requests BOTTLES DRAWN AEROBIC AND ANAEROBIC 5CC  Final    Culture   Final    NO GROWTH 2 DAYS Performed at [facility], [place] [address], [place], [place] [address]    Report Status PENDING  Incomplete  Culture, blood (Routine x 2)     Status: None (Preliminary result)   Collection Time: 02/06/16  2:15 PM  Result Value Ref Range Status   Specimen Description BLOOD RIGHT ARM  5 ML IN [facility] BOTTLE  Final   Special Requests NONE  Final   Culture   Final    NO GROWTH 2 DAYS Performed at [facility], [address] [place], [place] [address]    Report Status PENDING  Incomplete  Respiratory Panel by PCR     Status: Abnormal   Collection Time: 02/06/16  2:30 PM  Result Value Ref Range Status   Adenovirus NOT DETECTED NOT DETECTED Final   Coronavirus 229E NOT DETECTED NOT DETECTED Final   Coronavirus HKU1 NOT DETECTED NOT DETECTED Final   Coronavirus NL63 NOT DETECTED NOT DETECTED Final   Coronavirus OC43 NOT DETECTED NOT DETECTED Final   Metapneumovirus NOT DETECTED NOT DETECTED Final   Rhinovirus / Enterovirus NOT DETECTED NOT DETECTED Final   Influenza A H3 DETECTED (A) NOT  DETECTED Final   Influenza B NOT DETECTED NOT DETECTED Final   Parainfluenza Virus 1 NOT DETECTED NOT DETECTED Final   Parainfluenza Virus 2 NOT DETECTED NOT DETECTED Final   Parainfluenza Virus 3 NOT DETECTED NOT DETECTED Final   Parainfluenza Virus 4 NOT DETECTED NOT DETECTED Final   Respiratory Syncytial Virus NOT DETECTED NOT DETECTED Final   Bordetella pertussis NOT DETECTED NOT DETECTED Final   Chlamydophila pneumoniae NOT DETECTED NOT DETECTED Final   Mycoplasma pneumoniae NOT DETECTED NOT DETECTED Final    Comment: Performed at [facility], [place] [address] [place], [place] [address]       Blood Culture    Component Value Date/Time   SDES BLOOD RIGHT ARM  5 ML IN [facility] BOTTLE 02/06/2016 1415   SPECREQUEST NONE 02/06/2016 1415   CULT  02/06/2016 1415    NO GROWTH 2 DAYS Performed at [facility], [place] [address]  [place], [place] [address]    REPTSTATUS PENDING 02/06/2016 1415      Labs: Results for orders placed or performed during the hospital encounter of 02/06/16 (from the past 48 hour(s))  Magnesium     Status: None   Collection Time: 02/07/16  4:39 AM  Result Value Ref Range   Magnesium 1.9 1.7 - 2.4 mg/dL  Basic metabolic panel     Status: Abnormal   Collection Time: 02/07/16  4:39 AM  Result Value Ref Range   Sodium 137 135 - 145 mmol/L   Potassium 4.0 3.5 - 5.1 mmol/L    Comment: DELTA CHECK NOTED REPEATED TO VERIFY NO VISIBLE HEMOLYSIS    Chloride 109 101 - 111 mmol/L   CO2 22 22 - 32 mmol/L   Glucose, Bld 139 (H) 65 - 99 mg/dL   BUN 6 6 - 20 mg/dL   Creatinine, Ser 0.59 0.44 - 1.00 mg/dL   Calcium 8.8 (L) 8.9 - 10.3 mg/dL   GFR calc non Af Amer >60 >60 mL/min   GFR calc Af Amer >60 >60 mL/min    Comment: (NOTE) The eGFR has been calculated using the CKD EPI equation. This calculation has not been validated in all clinical situations. eGFR's persistently <60 mL/min signify possible Chronic Kidney Disease.    Anion gap 6 5 - 15  Glucose, capillary     Status: Abnormal   Collection Time: 02/07/16  8:15 AM  Result Value Ref Range   Glucose-Capillary 125 (H) 65 - 99 mg/dL  Glucose, capillary     Status: None   Collection Time: 02/07/16 10:04 PM  Result Value Ref Range   Glucose-Capillary 91 65 - 99 mg/dL   Comment 1 Notify RN   Glucose, capillary     Status: None   Collection Time: 02/08/16  7:53 AM  Result Value Ref Range   Glucose-Capillary 81 65 - 99 mg/dL   Comment 1 Notify RN    Comment 2 Document in Chart       HPI   Debra A Ellerbeis a 31 y.o.femalewith medical history significant for moderate persistent asthma, iron deficiency anemia, and history of pulmonary embolism who presents to the emergency department for evaluation of malaise, aches, dyspnea, chest tightness, and cough that developed last night.CTA PE study was obtained in the ED and negative for PE,  but notable for airway thickening/bronchitis. Blood and urine cultures were obtained, 2 L of normal saline were administered, and the patient was started on empiric vancomycin, Levaquin, and Azactam. She was treated with multiple DuoNeb's.  Influenza swab returned positive for influenza A and she was given Tamiflu. Admitted for asthma exacerbation secondary to influenza [facility] course  1. Acute asthma exacerbation, secondary to influenza A - Pt has hx of moderate-persistent asthma - She developed malaise, aches, chills the night prior to admit, then exacerbation in her asthma worsening on 02/06/16 despite her use of home treatments  - CXR clear; CTA neg for PE, notable for airway thickening; no suggestion of CHF  - She responded well initially to neb treatments in ED, but dyspnea at rest and wheezing returns within an hr  - Asthma exacerbation likely precipitated by influenza  Receive treatment with nebs, Tamiflu,   prednisone. Continue prednisone for another 3 days Continue Tamiflu for another 4 days Ambulatory pulse ox greater than 99%    2. SIRS -resolved Presented with fever, tachycardia and soft BP  - Code sepsis was initiated in ED; cultures were obtained, she was fluid-resuscitated, and started on empiric broad spectrums . Antibiotics discontinued due to no evidence of any pneumonia - Lactic acid has been reassuring, there is no leukocytosis, and her presentation is explained by influenza  - Continue Tamiflu as above, follow cultures, watched off of antibiotics  Blood culture remained negative  3. Hypokalemia  - Serum potassium 3.3 on admission;   Now repleted    4. Normocytic anemia  - Hgb is 10.9 on admission  - This is stable relative to priors, attributed to iron-deficiency, and with no active blood-loss  PCP to continue following outpatient    Discharge Exam:  Blood pressure (!) 104/57, pulse 60, temperature 97.6 F (36.4 C), temperature source Oral, resp. rate  16, height 5' 3"  (1.6 m), weight 96.6 kg (213 lb), last menstrual period 01/30/2016, SpO2 98 %.  General exam: Appears calm and comfortable  Respiratory system: Clear to auscultation. Respiratory effort normal. Cardiovascular system: S1 & S2 heard, RRR. No JVD, murmurs, rubs, gallops or clicks. No pedal edema. Gastrointestinal system: Abdomen is nondistended, soft and nontender. No organomegaly or masses felt. Normal bowel sounds heard. Central nervous system: Alert and oriented. No focal neurological deficits. Extremities: Symmetric 5 x 5 power. Skin: No rashes, lesions or ulcers Psychiatry: Judgement and insight appear normal. Mood & affect appropriate.     Follow-up Information    Mauricio Po, FNP. Schedule an appointment as soon as possible for a visit.   Specialty:  Family Medicine Why:  Hospital follow-up Contact information: [place] [address] [phone]           Signed: Reyne Dumas 02/08/2016, 6:07 PM        Time spent >45 mins

## 2016-02-08 NOTE — Care Management Note (Signed)
Case Management Note  Patient Details  Name: Debra Horn MRN: [id number] Date of Birth: 25-Jun-1985  Subjective/Objective: 31 y/o f admitted w/Asthma. From home. No CM needs.                   Action/Plan:d/c home.   Expected Discharge Date:  02/08/16               Expected Discharge Plan:  Home/Self Care  In-House Referral:     Discharge planning Services  CM Consult  Post Acute Care Choice:    Choice offered to:     DME Arranged:    DME Agency:     HH Arranged:    HH Agency:     Status of Service:  Completed, signed off  If discussed at [employer] Length of Stay Meetings, dates discussed:    Additional Comments:  Lanier ClamMahabir, Briant Angelillo, RN 02/08/2016, 12:49 PM

## 2016-02-08 NOTE — Progress Notes (Signed)
Physician Discharge Summary  Debra Horn MRN: [id number] DOB/AGE: 1985/10/24 31 y.o.  PCP: Mauricio Po, FNP   Admit date: 02/06/2016 Discharge date: 02/08/2016  Discharge Diagnoses:    Principal Problem:   Asthma exacerbation Active Problems:   Influenza A   Normocytic anemia   Hypokalemia   SIRS (systemic inflammatory response syndrome) (HCC)   Moderate asthma with exacerbation    Follow-up recommendations Follow-up with PCP in 3-5 days , including all  additional recommended appointments as below Follow-up CBC, CMP in 3-5 days       Current Discharge Medication List    START taking these medications   Details  oseltamivir (TAMIFLU) 75 MG capsule Take 1 capsule (75 mg total) by mouth 2 (two) times daily. Qty: 8 capsule, Refills: 0    predniSONE (DELTASONE) 50 MG tablet Take 1 tablet (50 mg total) by mouth daily with breakfast. Qty: 3 tablet, Refills: 0      CONTINUE these medications which have NOT CHANGED   Details  albuterol (PROVENTIL HFA;VENTOLIN HFA) 108 (90 Base) MCG/ACT inhaler Inhale 1-2 puffs into the lungs every 6 (six) hours as needed for wheezing or shortness of breath.    albuterol (PROVENTIL) (2.5 MG/3ML) 0.083% nebulizer solution Take 2.5 mg by nebulization every 6 (six) hours as needed for wheezing or shortness of breath.    oxyCODONE (ROXICODONE) 5 MG immediate release tablet Take 1 tablet (5 mg total) by mouth every 4 (four) hours as needed for severe pain. Qty: 15 tablet, Refills: 0      STOP taking these medications     ibuprofen (ADVIL,MOTRIN) 200 MG tablet          Discharge Condition: Stable   Discharge Instructions Get Medicines reviewed and adjusted: Please take all your medications with you for your next visit with your Primary MD  Please request your Primary MD to go over all hospital tests and procedure/radiological results at the follow up, please ask your Primary MD to get all Hospital records sent to his/her  office.  If you experience worsening of your admission symptoms, develop shortness of breath, life threatening emergency, suicidal or homicidal thoughts you must seek medical attention immediately by calling 911 or calling your MD immediately if symptoms less severe.  You must read complete instructions/literature along with all the possible adverse reactions/side effects for all the Medicines you take and that have been prescribed to you. Take any new Medicines after you have completely understood and accpet all the possible adverse reactions/side effects.   Do not drive when taking Pain medications.   Do not take more than prescribed Pain, Sleep and Anxiety Medications  Special Instructions: If you have smoked or chewed Tobacco in the last 2 yrs please stop smoking, stop any regular Alcohol and or any Recreational drug use.  Wear Seat belts while driving.  Please note  You were cared for by a hospitalist during your hospital stay. Once you are discharged, your primary care physician will handle any further medical issues. Please note that NO REFILLS for any discharge medications will be authorized once you are discharged, as it is imperative that you return to your primary care physician (or establish a relationship with a primary care physician if you do not have one) for your aftercare needs so that they can reassess your need for medications and monitor your lab values.     Allergies  Allergen Reactions  . Banana Anaphylaxis  . Cefixime Rash      Disposition: 01-Home or  Self Care   Consults:  None   Significant Diagnostic Studies:  Dg Chest 2 View  Result Date: 02/06/2016 CLINICAL DATA:  Chest pain EXAM: CHEST  2 VIEW COMPARISON:  09/01/2015 FINDINGS: Normal heart size and mediastinal contours. No acute infiltrate or edema. No effusion or pneumothorax. No acute osseous findings. IMPRESSION: Negative chest. Electronically Signed   By: Monte Fantasia M.D.   On: 02/06/2016  14:12   Ct Angio Chest Pe W And/or Wo Contrast  Result Date: 02/06/2016 CLINICAL DATA:  Shortness of breath and chest pain.  Tachycardia. EXAM: CT ANGIOGRAPHY CHEST WITH CONTRAST TECHNIQUE: Multidetector CT imaging of the chest was performed using the standard protocol during bolus administration of intravenous contrast. Multiplanar CT image reconstructions and MIPs were obtained to evaluate the vascular anatomy. CONTRAST:  100 cc Isovue 370 intravenous COMPARISON:  09/02/2015 FINDINGS: Cardiovascular: Satisfactory opacification of the pulmonary arteries to the segmental level. When accounting for intermittent motion there is no evidence of pulmonary embolism. Normal heart size. No pericardial effusion. Mediastinum/Nodes: Negative for adenopathy or mass. Minimal sliding hiatal hernia. Lungs/Pleura: Generalized airway thickening. There is no edema, consolidation, effusion, or pneumothorax. Upper Abdomen: Negative Musculoskeletal: Negative Review of the MIP images confirms the above findings. IMPRESSION: 1. Negative for pulmonary embolism. 2. Airway thickening/bronchitis Electronically Signed   By: Monte Fantasia M.D.   On: 02/06/2016 16:34        Filed Weights   02/06/16 1416 02/06/16 2117  Weight: 113.4 kg (250 lb) 96.6 kg (213 lb)     Microbiology: Recent Results (from the past 240 hour(s))  Culture, blood (Routine x 2)     Status: None (Preliminary result)   Collection Time: 02/06/16  1:47 PM  Result Value Ref Range Status   Specimen Description BLOOD LEFT ANTECUBITAL  Final   Special Requests BOTTLES DRAWN AEROBIC AND ANAEROBIC 5CC  Final   Culture   Final    NO GROWTH < 24 HOURS Performed at [facility], [place] [address] [place], [place] [address]    Report Status PENDING  Incomplete  Culture, blood (Routine x 2)     Status: None (Preliminary result)   Collection Time: 02/06/16  2:15 PM  Result Value Ref Range Status   Specimen Description BLOOD RIGHT ARM  5 ML IN [facility] BOTTLE   Final   Special Requests NONE  Final   Culture   Final    NO GROWTH < 24 HOURS Performed at [facility], [place] [address] [place], [place] [address]    Report Status PENDING  Incomplete  Respiratory Panel by PCR     Status: Abnormal   Collection Time: 02/06/16  2:30 PM  Result Value Ref Range Status   Adenovirus NOT DETECTED NOT DETECTED Final   Coronavirus 229E NOT DETECTED NOT DETECTED Final   Coronavirus HKU1 NOT DETECTED NOT DETECTED Final   Coronavirus NL63 NOT DETECTED NOT DETECTED Final   Coronavirus OC43 NOT DETECTED NOT DETECTED Final   Metapneumovirus NOT DETECTED NOT DETECTED Final   Rhinovirus / Enterovirus NOT DETECTED NOT DETECTED Final   Influenza A H3 DETECTED (A) NOT DETECTED Final   Influenza B NOT DETECTED NOT DETECTED Final   Parainfluenza Virus 1 NOT DETECTED NOT DETECTED Final   Parainfluenza Virus 2 NOT DETECTED NOT DETECTED Final   Parainfluenza Virus 3 NOT DETECTED NOT DETECTED Final   Parainfluenza Virus 4 NOT DETECTED NOT DETECTED Final   Respiratory Syncytial Virus NOT DETECTED NOT DETECTED Final   Bordetella  pertussis NOT DETECTED NOT DETECTED Final   Chlamydophila pneumoniae NOT DETECTED NOT DETECTED Final   Mycoplasma pneumoniae NOT DETECTED NOT DETECTED Final    Comment: Performed at [facility], [place] [address], [place], [place] [address]       Blood Culture    Component Value Date/Time   SDES BLOOD RIGHT ARM  5 ML IN [facility] BOTTLE 02/06/2016 1415   SPECREQUEST NONE 02/06/2016 1415   CULT  02/06/2016 1415    NO GROWTH < 24 HOURS Performed at [facility], [place] [address] [place], [place] [address]    REPTSTATUS PENDING 02/06/2016 1415      Labs: Results for orders placed or performed during the hospital encounter of 02/06/16 (from the past 48 hour(s))  CBC     Status: Abnormal   Collection Time: 02/06/16  1:47 PM  Result Value Ref Range   WBC 6.2 4.0 - 10.5 K/uL   RBC 4.04 3.87 - 5.11 MIL/uL   Hemoglobin 10.9 (L)  12.0 - 15.0 g/dL   HCT 33.5 (L) 36.0 - 46.0 %   MCV 82.9 78.0 - 100.0 fL   MCH 27.0 26.0 - 34.0 pg   MCHC 32.5 30.0 - 36.0 g/dL   RDW 15.1 11.5 - 15.5 %   Platelets 385 150 - 400 K/uL  Comprehensive metabolic panel     Status: Abnormal   Collection Time: 02/06/16  1:47 PM  Result Value Ref Range   Sodium 137 135 - 145 mmol/L   Potassium 3.3 (L) 3.5 - 5.1 mmol/L   Chloride 106 101 - 111 mmol/L   CO2 23 22 - 32 mmol/L   Glucose, Bld 91 65 - 99 mg/dL   BUN 9 6 - 20 mg/dL   Creatinine, Ser 0.80 0.44 - 1.00 mg/dL   Calcium 9.1 8.9 - 10.3 mg/dL   Total Protein 8.2 (H) 6.5 - 8.1 g/dL   Albumin 4.0 3.5 - 5.0 g/dL   AST 25 15 - 41 U/L   ALT 21 14 - 54 U/L   Alkaline Phosphatase 88 38 - 126 U/L   Total Bilirubin 0.4 0.3 - 1.2 mg/dL   GFR calc non Af Amer >60 >60 mL/min   GFR calc Af Amer >60 >60 mL/min    Comment: (NOTE) The eGFR has been calculated using the CKD EPI equation. This calculation has not been validated in all clinical situations. eGFR's persistently <60 mL/min signify possible Chronic Kidney Disease.    Anion gap 8 5 - 15  Protime-INR     Status: None   Collection Time: 02/06/16  1:47 PM  Result Value Ref Range   Prothrombin Time 14.0 11.4 - 15.2 seconds   INR 1.08   Culture, blood (Routine x 2)     Status: None (Preliminary result)   Collection Time: 02/06/16  1:47 PM  Result Value Ref Range   Specimen Description BLOOD LEFT ANTECUBITAL    Special Requests BOTTLES DRAWN AEROBIC AND ANAEROBIC 5CC    Culture      NO GROWTH < 24 HOURS Performed at [facility], [place] [address], [place], [place] [address]    Report Status PENDING   hCG, quantitative, pregnancy     Status: None   Collection Time: 02/06/16  1:50 PM  Result Value Ref Range   hCG, Beta Chain, Quant, S <1 <5 mIU/mL    Comment:          GEST. AGE      CONC.  (  mIU/mL)   <=1 WEEK        5 - 50     2 WEEKS       50 - 500     3 WEEKS       100 - 10,000     4 WEEKS     1,000 - 30,000     5 WEEKS      3,500 - 115,000   6-8 WEEKS     12,000 - 270,000    12 WEEKS     15,000 - 220,000        FEMALE AND NON-PREGNANT FEMALE:     LESS THAN 5 mIU/mL   I-stat troponin, ED     Status: None   Collection Time: 02/06/16  1:57 PM  Result Value Ref Range   Troponin i, poc 0.00 0.00 - 0.08 ng/mL   Comment 3            Comment: Due to the release kinetics of cTnI, a negative result within the first hours of the onset of symptoms does not rule out myocardial infarction with certainty. If myocardial infarction is still suspected, repeat the test at appropriate intervals.   I-Stat CG4 Lactic Acid, ED     Status: None   Collection Time: 02/06/16  1:59 PM  Result Value Ref Range   Lactic Acid, Venous 1.39 0.5 - 1.9 mmol/L  Culture, blood (Routine x 2)     Status: None (Preliminary result)   Collection Time: 02/06/16  2:15 PM  Result Value Ref Range   Specimen Description BLOOD RIGHT ARM  5 ML IN [facility] BOTTLE    Special Requests NONE    Culture      NO GROWTH < 24 HOURS Performed at [facility], [place] [address], [place], [place] [address]    Report Status PENDING   Respiratory Panel by PCR     Status: Abnormal   Collection Time: 02/06/16  2:30 PM  Result Value Ref Range   Adenovirus NOT DETECTED NOT DETECTED   Coronavirus 229E NOT DETECTED NOT DETECTED   Coronavirus HKU1 NOT DETECTED NOT DETECTED   Coronavirus NL63 NOT DETECTED NOT DETECTED   Coronavirus OC43 NOT DETECTED NOT DETECTED   Metapneumovirus NOT DETECTED NOT DETECTED   Rhinovirus / Enterovirus NOT DETECTED NOT DETECTED   Influenza A H3 DETECTED (A) NOT DETECTED   Influenza B NOT DETECTED NOT DETECTED   Parainfluenza Virus 1 NOT DETECTED NOT DETECTED   Parainfluenza Virus 2 NOT DETECTED NOT DETECTED   Parainfluenza Virus 3 NOT DETECTED NOT DETECTED   Parainfluenza Virus 4 NOT DETECTED NOT DETECTED   Respiratory Syncytial Virus NOT DETECTED NOT DETECTED   Bordetella pertussis NOT DETECTED NOT DETECTED   Chlamydophila  pneumoniae NOT DETECTED NOT DETECTED   Mycoplasma pneumoniae NOT DETECTED NOT DETECTED    Comment: Performed at [facility], [place] [address] [place], [place] [address]  Urinalysis, Routine w reflex microscopic     Status: Abnormal   Collection Time: 02/06/16  4:55 PM  Result Value Ref Range   Color, Urine YELLOW YELLOW   APPearance CLEAR CLEAR   Specific Gravity, Urine >1.046 (H) 1.005 - 1.030   pH 6.0 5.0 - 8.0   Glucose, UA NEGATIVE NEGATIVE mg/dL   Hgb urine dipstick NEGATIVE NEGATIVE   Bilirubin Urine NEGATIVE NEGATIVE   Ketones, ur 5 (A) NEGATIVE mg/dL   Protein, ur NEGATIVE NEGATIVE mg/dL   Nitrite NEGATIVE NEGATIVE   Leukocytes, UA NEGATIVE  NEGATIVE  Influenza panel by PCR (type A & B)     Status: Abnormal   Collection Time: 02/06/16  4:55 PM  Result Value Ref Range   Influenza A By PCR POSITIVE (A) NEGATIVE   Influenza B By PCR NEGATIVE NEGATIVE    Comment: (NOTE) The Xpert Xpress Flu assay is intended as an aid in the diagnosis of  influenza and should not be used as a sole basis for treatment.  This  assay is FDA approved for nasopharyngeal swab specimens only. Nasal  washings and aspirates are unacceptable for Xpert Xpress Flu testing.   I-Stat CG4 Lactic Acid, ED     Status: None   Collection Time: 02/06/16  5:05 PM  Result Value Ref Range   Lactic Acid, Venous 0.95 0.5 - 1.9 mmol/L  Magnesium     Status: None   Collection Time: 02/07/16  4:39 AM  Result Value Ref Range   Magnesium 1.9 1.7 - 2.4 mg/dL  Basic metabolic panel     Status: Abnormal   Collection Time: 02/07/16  4:39 AM  Result Value Ref Range   Sodium 137 135 - 145 mmol/L   Potassium 4.0 3.5 - 5.1 mmol/L    Comment: DELTA CHECK NOTED REPEATED TO VERIFY NO VISIBLE HEMOLYSIS    Chloride 109 101 - 111 mmol/L   CO2 22 22 - 32 mmol/L   Glucose, Bld 139 (H) 65 - 99 mg/dL   BUN 6 6 - 20 mg/dL   Creatinine, Ser 0.59 0.44 - 1.00 mg/dL   Calcium 8.8 (L) 8.9 - 10.3 mg/dL   GFR calc non Af Amer  >60 >60 mL/min   GFR calc Af Amer >60 >60 mL/min    Comment: (NOTE) The eGFR has been calculated using the CKD EPI equation. This calculation has not been validated in all clinical situations. eGFR's persistently <60 mL/min signify possible Chronic Kidney Disease.    Anion gap 6 5 - 15  Glucose, capillary     Status: Abnormal   Collection Time: 02/07/16  8:15 AM  Result Value Ref Range   Glucose-Capillary 125 (H) 65 - 99 mg/dL  Glucose, capillary     Status: None   Collection Time: 02/07/16 10:04 PM  Result Value Ref Range   Glucose-Capillary 91 65 - 99 mg/dL   Comment 1 Notify RN   Glucose, capillary     Status: None   Collection Time: 02/08/16  7:53 AM  Result Value Ref Range   Glucose-Capillary 81 65 - 99 mg/dL   Comment 1 Notify RN    Comment 2 Document in Chart       HPI   Everleigh A Ellerbeis a 30 y.o.femalewith medical history significant for moderate persistent asthma, iron deficiency anemia, and history of pulmonary embolism who presents to the emergency department for evaluation of malaise, aches, dyspnea, chest tightness, and cough that developed last night.CTA PE study was obtained in the ED and negative for PE, but notable for airway thickening/bronchitis. Blood and urine cultures were obtained, 2 L of normal saline were administered, and the patient was started on empiric vancomycin, Levaquin, and Azactam. She was treated with multiple DuoNeb's. Influenza swab returned positive for influenza A and she was given Tamiflu. Admitted for asthma exacerbation secondary to influenza [facility] course  1. Acute asthma exacerbation, secondary to influenza A - Pt has hx of moderate-persistent asthma - She developed malaise, aches, chills the night prior to admit, then exacerbation in her asthma worsening on 02/06/16  despite her use of home treatments  - CXR clear; CTA neg for PE, notable for airway thickening; no suggestion of CHF  - She responded well initially to neb  treatments in ED, but dyspnea at rest and wheezing returns within an hr  - Asthma exacerbation likely precipitated by influenza  Receive treatment with nebs, Tamiflu,   prednisone. Continue prednisone for another 3 days Continue Tamiflu for another 4 days Ambulatory pulse ox greater than 99%    2. SIRS -resolved Presented with fever, tachycardia and soft BP  - Code sepsis was initiated in ED; cultures were obtained, she was fluid-resuscitated, and started on empiric broad spectrums . Antibiotics discontinued due to no evidence of any pneumonia - Lactic acid has been reassuring, there is no leukocytosis, and her presentation is explained by influenza  - Continue Tamiflu as above, follow cultures, watched off of antibiotics  Blood culture remained negative  3. Hypokalemia  - Serum potassium 3.3 on admission;   Now repleted    4. Normocytic anemia  - Hgb is 10.9 on admission  - This is stable relative to priors, attributed to iron-deficiency, and with no active blood-loss  PCP to continue following outpatient    Discharge Exam:  Blood pressure (!) 104/57, pulse 60, temperature 97.6 F (36.4 C), temperature source Oral, resp. rate 16, height 5' 3"  (1.6 m), weight 96.6 kg (213 lb), last menstrual period 01/30/2016, SpO2 98 %.  General exam: Appears calm and comfortable  Respiratory system: Clear to auscultation. Respiratory effort normal. Cardiovascular system: S1 & S2 heard, RRR. No JVD, murmurs, rubs, gallops or clicks. No pedal edema. Gastrointestinal system: Abdomen is nondistended, soft and nontender. No organomegaly or masses felt. Normal bowel sounds heard. Central nervous system: Alert and oriented. No focal neurological deficits. Extremities: Symmetric 5 x 5 power. Skin: No rashes, lesions or ulcers Psychiatry: Judgement and insight appear normal. Mood & affect appropriate.     Follow-up Information    Mauricio Po, FNP. Schedule an appointment as soon as possible  for a visit.   Specialty:  Family Medicine Why:  Hospital follow-up Contact information: [place] [address] [phone]           Signed: Reyne Dumas 02/08/2016, 9:22 AM        Time spent >45 mins

## 2016-02-08 NOTE — Progress Notes (Signed)
Patient ambulated in hallway and oxygen sat was 100% on room air.

## 2016-02-09 LAB — URINE CULTURE: CULTURE: NO GROWTH

## 2016-02-11 LAB — CULTURE, BLOOD (ROUTINE X 2)
CULTURE: NO GROWTH
Culture: NO GROWTH

## 2016-03-10 ENCOUNTER — Other Ambulatory Visit: Payer: Self-pay | Admitting: Physician Assistant

## 2016-05-17 IMAGING — CR DG CHEST 2V
2 series · 2 of 2 positions shown · non-contrast
Comparison: Chest radiograph performed 06/26/2013

CLINICAL DATA: Shortness of breath.  History of asthma.

EXAM:
CHEST  2 VIEW

[w chest pa]
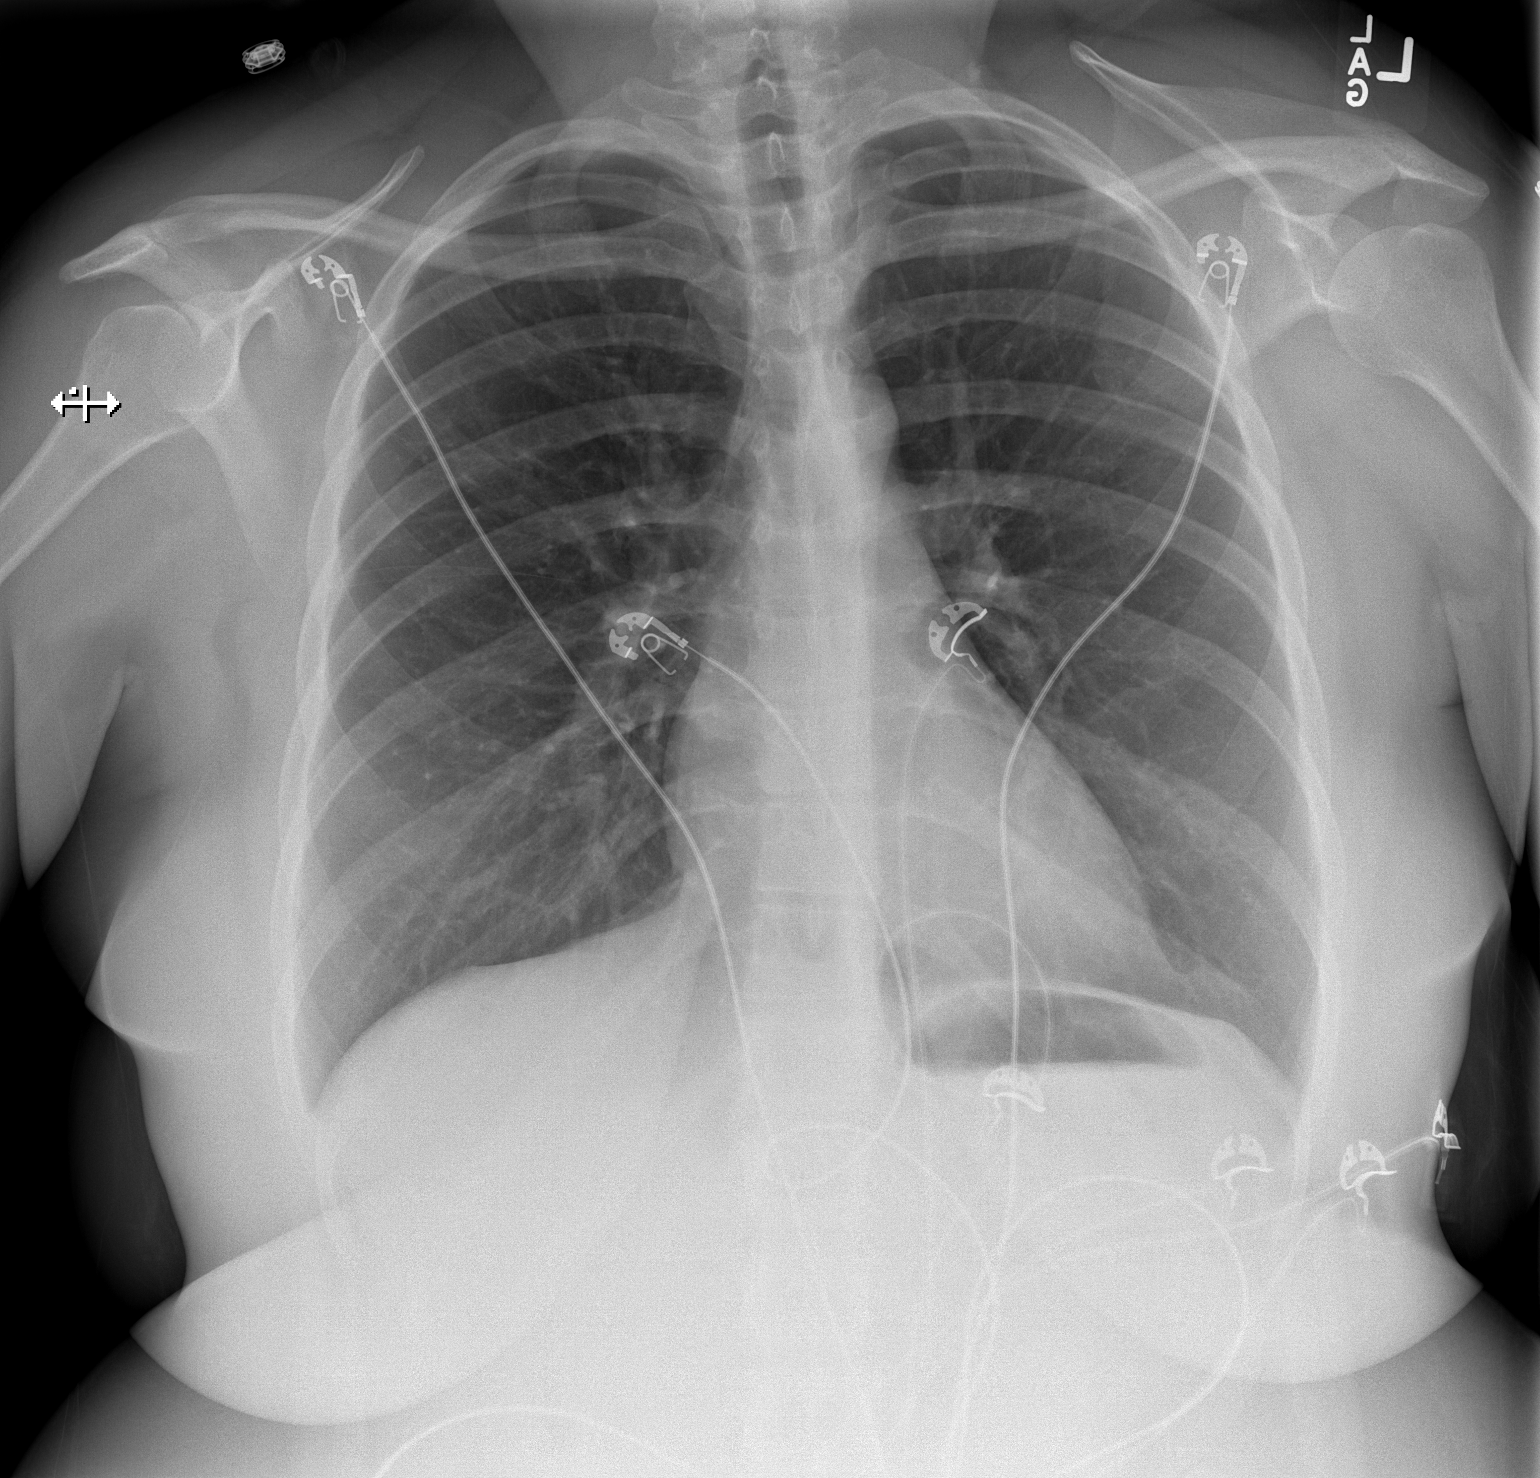

[w chest lat]
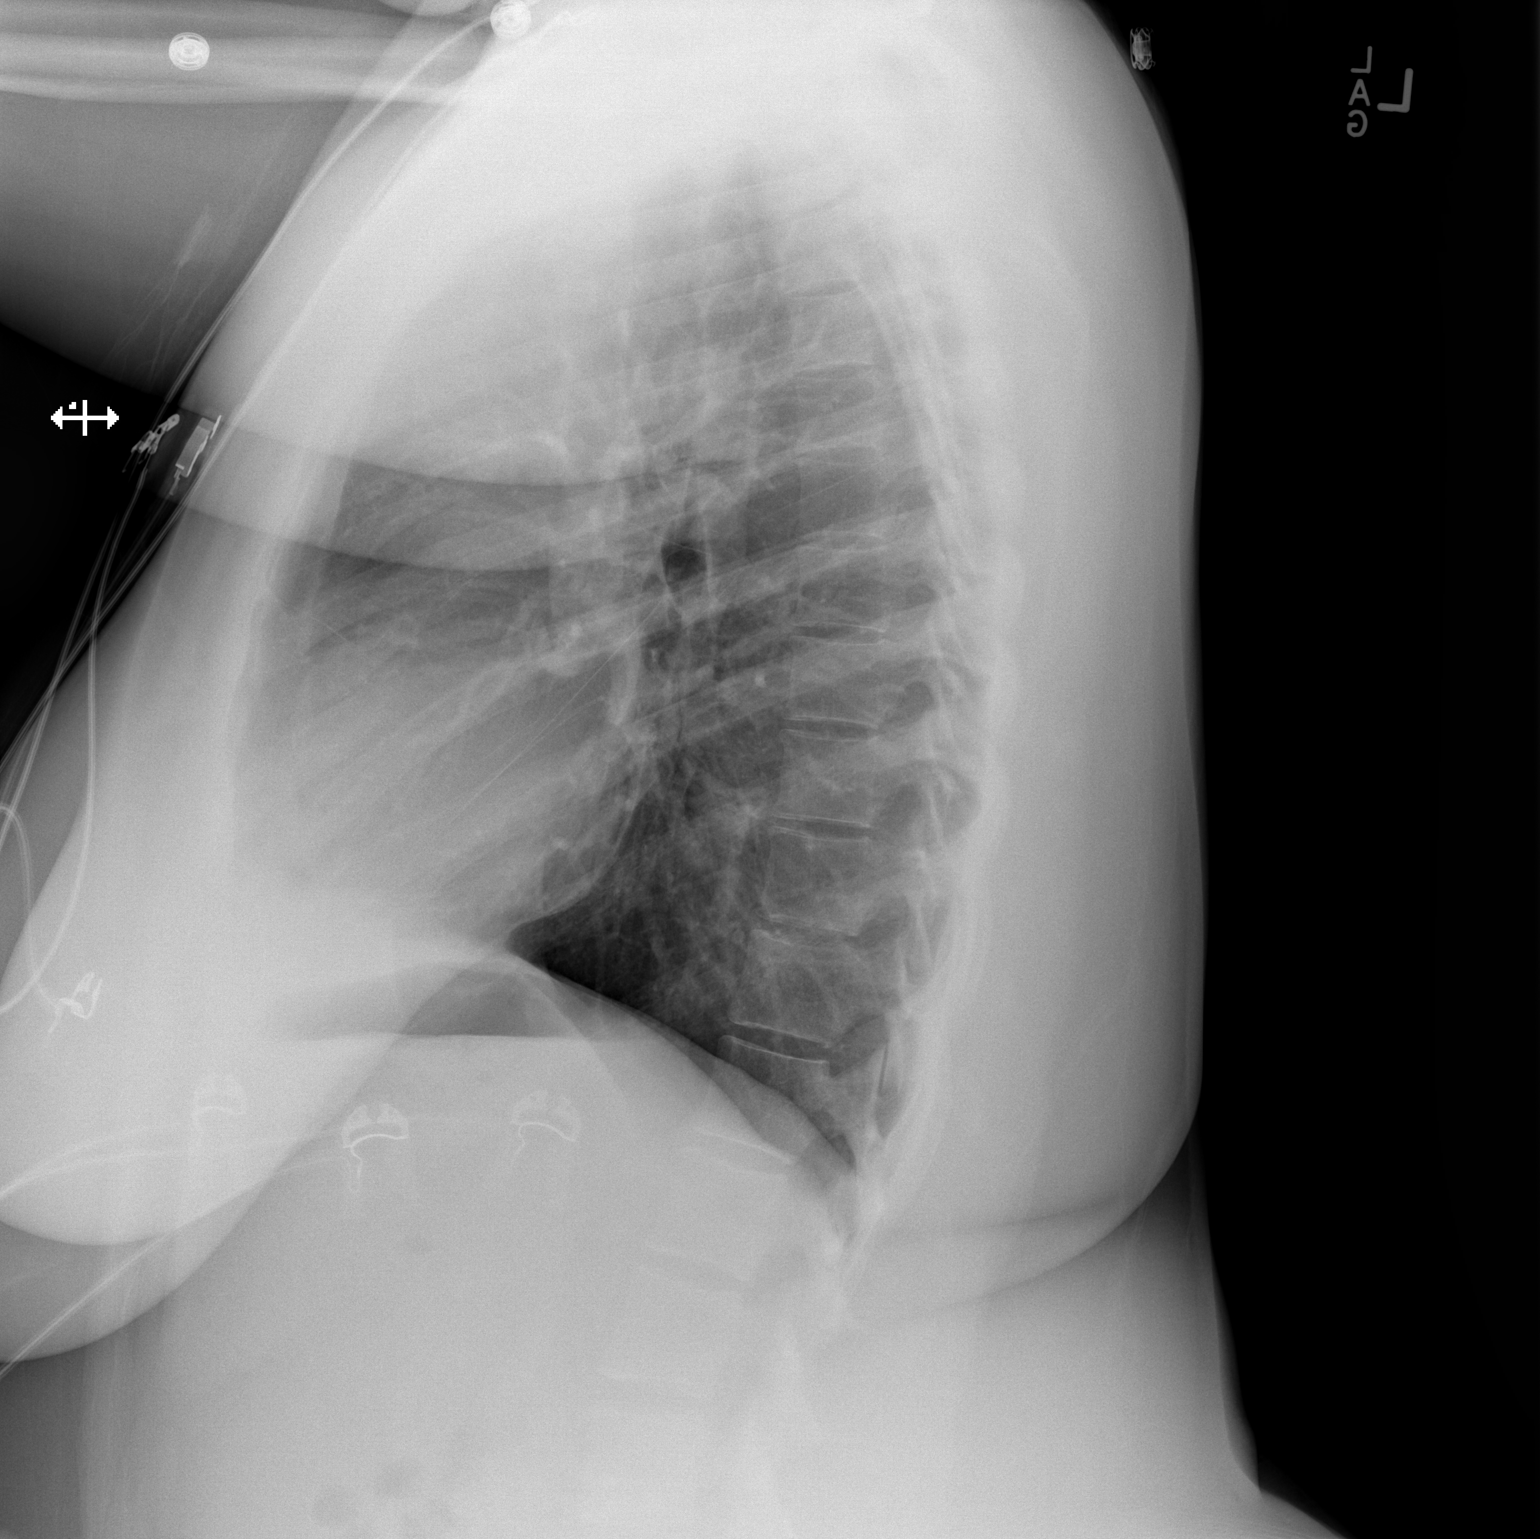

[2 of 2 positions shown; findings below may reference images not displayed]

FINDINGS: The lungs are well-aerated and clear. There is no evidence of focal
opacification, pleural effusion or pneumothorax.

The heart is normal in size; the mediastinal contour is within
normal limits. No acute osseous abnormalities are seen.
IMPRESSION: No acute cardiopulmonary process seen.

## 2016-12-29 ENCOUNTER — Encounter: Payer: Self-pay | Admitting: Obstetrics

## 2016-12-29 ENCOUNTER — Ambulatory Visit (INDEPENDENT_AMBULATORY_CARE_PROVIDER_SITE_OTHER): Payer: BLUE CROSS/BLUE SHIELD

## 2016-12-29 DIAGNOSIS — O219 Vomiting of pregnancy, unspecified: Secondary | ICD-10-CM

## 2016-12-29 DIAGNOSIS — N912 Amenorrhea, unspecified: Secondary | ICD-10-CM

## 2016-12-29 DIAGNOSIS — Z3201 Encounter for pregnancy test, result positive: Secondary | ICD-10-CM

## 2016-12-29 LAB — POCT URINE PREGNANCY: Preg Test, Ur: POSITIVE — AB

## 2016-12-29 MED ORDER — PROMETHAZINE HCL 25 MG PO TABS: 25.0000 mg | ORAL_TABLET | Freq: Four times a day (QID) | ORAL | 1 refills | Status: DC | PRN

## 2016-12-29 NOTE — Progress Notes (Signed)
Patient presents for Nurse Visit to confirm pregnancy. UPT: Positive. Pt given prenatal vitamins  LMP: 10/14/2016 EDD :07/21/2017 Pt c/o Nausea and Vomiting  Consulted w/ Dr.Arnold rx sent to pt pharmacy.

## 2016-12-31 ENCOUNTER — Inpatient Hospital Stay (HOSPITAL_COMMUNITY)
Admission: AD | Admit: 2016-12-31 | Discharge: 2016-12-31 | Disposition: A | Discharge status: Home / Self Care | Payer: BLUE CROSS/BLUE SHIELD | Source: Ambulatory Visit | Attending: Obstetrics & Gynecology | Admitting: Obstetrics & Gynecology

## 2016-12-31 ENCOUNTER — Encounter (HOSPITAL_COMMUNITY): Payer: Self-pay | Admitting: *Deleted

## 2016-12-31 ENCOUNTER — Inpatient Hospital Stay (HOSPITAL_COMMUNITY): Payer: BLUE CROSS/BLUE SHIELD

## 2016-12-31 DIAGNOSIS — O209 Hemorrhage in early pregnancy, unspecified: Secondary | ICD-10-CM | POA: Diagnosis not present

## 2016-12-31 DIAGNOSIS — Z86711 Personal history of pulmonary embolism: Secondary | ICD-10-CM | POA: Insufficient documentation

## 2016-12-31 DIAGNOSIS — Z79899 Other long term (current) drug therapy: Secondary | ICD-10-CM | POA: Diagnosis not present

## 2016-12-31 DIAGNOSIS — Z3491 Encounter for supervision of normal pregnancy, unspecified, first trimester: Secondary | ICD-10-CM | POA: Diagnosis not present

## 2016-12-31 DIAGNOSIS — K117 Disturbances of salivary secretion: Secondary | ICD-10-CM | POA: Diagnosis not present

## 2016-12-31 DIAGNOSIS — J45909 Unspecified asthma, uncomplicated: Secondary | ICD-10-CM | POA: Diagnosis not present

## 2016-12-31 DIAGNOSIS — O99511 Diseases of the respiratory system complicating pregnancy, first trimester: Secondary | ICD-10-CM | POA: Diagnosis not present

## 2016-12-31 DIAGNOSIS — O99611 Diseases of the digestive system complicating pregnancy, first trimester: Secondary | ICD-10-CM | POA: Diagnosis not present

## 2016-12-31 DIAGNOSIS — Z91018 Allergy to other foods: Secondary | ICD-10-CM | POA: Diagnosis not present

## 2016-12-31 DIAGNOSIS — Z3A11 11 weeks gestation of pregnancy: Secondary | ICD-10-CM | POA: Insufficient documentation

## 2016-12-31 DIAGNOSIS — Z881 Allergy status to other antibiotic agents status: Secondary | ICD-10-CM | POA: Insufficient documentation

## 2016-12-31 DIAGNOSIS — N939 Abnormal uterine and vaginal bleeding, unspecified: Secondary | ICD-10-CM | POA: Diagnosis present

## 2016-12-31 LAB — URINALYSIS, ROUTINE W REFLEX MICROSCOPIC
BACTERIA UA: NONE SEEN
Bilirubin Urine: NEGATIVE
GLUCOSE, UA: NEGATIVE mg/dL
KETONES UR: NEGATIVE mg/dL
Leukocytes, UA: NEGATIVE
Nitrite: NEGATIVE
PROTEIN: 30 mg/dL — AB
Specific Gravity, Urine: 1.019 (ref 1.005–1.030)
pH: 6 (ref 5.0–8.0)

## 2016-12-31 LAB — CBC
HCT: 35.3 % — ABNORMAL LOW (ref 36.0–46.0)
Hemoglobin: 11.6 g/dL — ABNORMAL LOW (ref 12.0–15.0)
MCH: 27.6 pg (ref 26.0–34.0)
MCHC: 32.9 g/dL (ref 30.0–36.0)
MCV: 84 fL (ref 78.0–100.0)
Platelets: 391 10*3/uL (ref 150–400)
RBC: 4.2 MIL/uL (ref 3.87–5.11)
RDW: 15 % (ref 11.5–15.5)
WBC: 7.9 10*3/uL (ref 4.0–10.5)

## 2016-12-31 LAB — WET PREP, GENITAL
Clue Cells Wet Prep HPF POC: NONE SEEN
Sperm: NONE SEEN
Trich, Wet Prep: NONE SEEN
Yeast Wet Prep HPF POC: NONE SEEN

## 2016-12-31 LAB — HCG, QUANTITATIVE, PREGNANCY: hCG, Beta Chain, Quant, S: 137927 m[IU]/mL — ABNORMAL HIGH (ref <5)

## 2016-12-31 LAB — ABO/RH: ABO/RH(D): B POS

## 2016-12-31 MED ORDER — GLYCOPYRROLATE 1 MG PO TABS: 2.0000 mg | ORAL_TABLET | Freq: Three times a day (TID) | ORAL | 3 refills | Status: DC

## 2016-12-31 NOTE — MAU Note (Signed)
Started bleeding 45 minutes ago. States she  Went to lay down and noticed blood on the bed. Light red in color. Small few clots. Denies recent intercourse.   Denies pain at this time.   +vomiting all day per patient Has not picked up prescription that was called in yet.

## 2016-12-31 NOTE — MAU Provider Note (Signed)
Chief Complaint: Vaginal Bleeding   First Provider Initiated Contact with Patient 12/31/16 1739     SUBJECTIVE HPI: Debra Horn is a 31 y.o. G3P0101 at 1646w1d by LMP who presents to maternity admissions reporting vaginal bleeding. Vaginal bleeding is a new onset that just started an hour ago. She reports trying to lay down to go to bed and felt something down her leg. She reports bleeding as bright red with no clots. She denies abdominal pain or cramping. Was seen in the clinic on 12/28 for a UPT and N/V- was sent phenergan but has not picked up prescription. She reports spitting constantly into tissue and "feels like something is always stuck in her throat like she is going to choke". She denies vaginal itching/burning, urinary symptoms, h/a, dizziness, n/v, or fever/chills.    Past Medical History:  Diagnosis Date  . Anemia    low iron  . Anxiety   . Asthma   . Complication of anesthesia    bp dropped in middle of last c-section and started with n/v  . GERD (gastroesophageal reflux disease)    during pregnancy  . Headache    "couple days/week" (10/28/2015)  . Hyperthyroidism    not on medication  . PE (pulmonary embolism) 11/2011  . Pneumonia   . Preeclampsia   . Preeclampsia   . Pregnancy induced hypertension   . Preterm labor    Past Surgical History:  Procedure Laterality Date  . CESAREAN SECTION  2008; 2014  . FRACTURE SURGERY    . ORIF ANKLE FRACTURE Right 10/28/2015  . ORIF ANKLE FRACTURE Right 10/28/2015   Procedure: OPEN REDUCTION INTERNAL FIXATION (ORIF) RIGHT ANKLE FRACTURE;  Surgeon: Yolonda KidaJason Patrick Elizet Kaplan, MD;  Location: [facility] OR;  Service: Orthopedics;  Laterality: Right;   Social History   Socioeconomic History  . Marital status: Married    Spouse name: Not on file  . Number of children: 2  . Years of education: 7414  . Highest education level: Not on file  Social Needs  . Financial resource strain: Not on file  . Food insecurity - worry: Not on file  .  Food insecurity - inability: Not on file  . Transportation needs - medical: Not on file  . Transportation needs - non-medical: Not on file  Occupational History  . Occupation: Clinical biochemistCustomer Service  Tobacco Use  . Smoking status: Never Smoker  . Smokeless tobacco: Never Used  Substance and Sexual Activity  . Alcohol use: No  . Drug use: No  . Sexual activity: Yes    Birth control/protection: None  Other Topics Concern  . Not on file  Social History Narrative   Born [place] and raised [place], [place]. Currently resides in an apartment with her husband and 2 children. No pets. Fun: Work, sleep   Denies religious beliefs that would effect health care.    No current facility-administered medications on file prior to encounter.    Current Outpatient Medications on File Prior to Encounter  Medication Sig Dispense Refill  . albuterol (PROVENTIL HFA;VENTOLIN HFA) 108 (90 Base) MCG/ACT inhaler Inhale 1-2 puffs into the lungs every 6 (six) hours as needed for wheezing or shortness of breath.    Marland Kitchen. albuterol (PROVENTIL) (2.5 MG/3ML) 0.083% nebulizer solution Take 3 mLs (2.5 mg total) by nebulization every 6 (six) hours as needed for wheezing or shortness of breath. 75 mL 12  . oxyCODONE (ROXICODONE) 5 MG immediate release tablet Take 1 tablet (5 mg total) by mouth every 4 (  four) hours as needed for severe pain. 15 tablet 0  . PROAIR HFA 108 (90 Base) MCG/ACT inhaler INHALE 2 PUFFS BY MOUTH EVERY 4 HOURS AS NEEDED 1 Inhaler 0  . promethazine (PHENERGAN) 25 MG tablet Take 1 tablet (25 mg total) by mouth every 6 (six) hours as needed for nausea or vomiting. 30 tablet 1   Allergies  Allergen Reactions  . Banana Anaphylaxis  . Cefixime Rash    ROS:  Review of Systems  Constitutional: Negative.   Respiratory: Negative.   Cardiovascular: Negative.   Gastrointestinal: Negative for abdominal pain, constipation, diarrhea, nausea and vomiting.       Positive for excessive spitting   Genitourinary: Positive for vaginal bleeding. Negative for difficulty urinating, dysuria, frequency, pelvic pain, urgency and vaginal pain.  Musculoskeletal: Negative.   Skin: Negative.   Neurological: Negative.   Psychiatric/Behavioral: Negative.    I have reviewed patient's Past Medical Hx, Surgical Hx, Family Hx, Social Hx, medications and allergies.   Physical Exam   Patient Vitals for the past 24 hrs:  BP Temp Temp src Pulse Resp SpO2 Weight  12/31/16 1734 129/83 - - 84 - - -  12/31/16 1733 (!) 142/82 98.3 F (36.8 C) Oral (!) 101 17 100 % -  12/31/16 1725 - - - - - - 215 lb 1.3 oz (97.6 kg)   Constitutional: Well-developed, well-nourished female in no acute distress.  Cardiovascular: normal rate Respiratory: normal effort GI: Abd soft, non-tender. Pos BS x 4 MS: Extremities nontender, no edema, normal ROM Neurologic: Alert and oriented x 4.  GU: Neg CVAT.  PELVIC EXAM: Cervix pink, visually closed, without lesion, moderate bright red bleeding pooling into vagina with mucus present from cervix, vaginal walls and external genitalia normal Bimanual exam: Cervix 0/long/high, firm, anterior, neg CMT, uterus nontender, nonenlarged, adnexa without tenderness, enlargement, or mass  LAB RESULTS Results for orders placed or performed during the hospital encounter of 12/31/16 (from the past 24 hour(s))  Urinalysis, Routine w reflex microscopic     Status: Abnormal   Collection Time: 12/31/16  5:21 PM  Result Value Ref Range   Color, Urine YELLOW YELLOW   APPearance HAZY (A) CLEAR   Specific Gravity, Urine 1.019 1.005 - 1.030   pH 6.0 5.0 - 8.0   Glucose, UA NEGATIVE NEGATIVE mg/dL   Hgb urine dipstick LARGE (A) NEGATIVE   Bilirubin Urine NEGATIVE NEGATIVE   Ketones, ur NEGATIVE NEGATIVE mg/dL   Protein, ur 30 (A) NEGATIVE mg/dL   Nitrite NEGATIVE NEGATIVE   Leukocytes, UA NEGATIVE NEGATIVE   RBC / HPF TOO NUMEROUS TO COUNT 0 - 5 RBC/hpf   WBC, UA 0-5 0 - 5 WBC/hpf    Bacteria, UA NONE SEEN NONE SEEN   Squamous Epithelial / LPF 0-5 (A) NONE SEEN   Mucus PRESENT        IMAGING US Ob Comp Less 14 Wks  Result Date: 12/31/2016 CLINICAL DATA:  Vaginal bleeding. Gestational age by LMP of 11 weeks 1 day. EXAM: OBSTETRIC <14 WK ULTRASOUND TECHNIQUE: Transabdominal ultrasound was performed for evaluation of the gestation as well as the maternal uterus and adnexal regions. COMPARISON:  None. FINDINGS: Intrauterine gestational sac: Single Yolk sac:  Not Visualized. Embryo:  Visualized. Cardiac Activity: Visualized. Heart Rate: 186 bpm CRL:   26  mm   9 w 2 d                  [place] EDC: 08/03/2017 Subchorionic hemorrhage:  None visualized. Maternal uterus/adnexae:  Both ovaries are normal in appearance. No mass or free fluid identified. IMPRESSION: Single living IUP measuring 9 weeks 2 days, with US EDC of 08/03/2017. No significant maternal uterine or adnexal abnormality identified. Electronically Signed   By: Myles RosenthalJohn  Stahl M.D.   On: 12/31/2016 18:28    MAU Management/MDM: Orders Placed This Encounter  Procedures  . Wet prep, genital  . US OB Comp Less 14 Wks  . Urinalysis, Routine w reflex microscopic  . CBC  . hCG, quantitative, pregnancy  . ABO/Rh  . Discharge patient Discharge disposition: 01-Home or Self Care; Discharge patient date: 12/31/2016  Wet prep- normal, no follow up needed   Meds ordered this encounter  Medications  . glycopyrrolate (ROBINUL) 1 MG tablet    Sig: Take 2 tablets (2 mg total) by mouth 3 (three) times daily.    Dispense:  90 tablet    Refill:  3    Order Specific Question:   Supervising Provider    Answer:   Willodean RosenthalHARRAWAY-SMITH, CAROLYN [[id number]]    Pt discharged with strict bleeding precautions and pelvic rest instructions. Follow up as scheduled and return to MAU as needed for increase vaginal bleeding like a period.   ASSESSMENT 1. Normal IUP (intrauterine pregnancy) on prenatal ultrasound, first trimester   2. Bleeding in early  pregnancy   3. Ptyalism     PLAN Discharge home Follow up as scheduled in office for initial prenatal visit  Rx for Robinul for Ptyalism    Allergies as of 12/31/2016      Reactions   Banana Anaphylaxis   Cefixime Rash      Medication List    STOP taking these medications   oxyCODONE 5 MG immediate release tablet Commonly known as:  ROXICODONE     TAKE these medications   albuterol 108 (90 Base) MCG/ACT inhaler Commonly known as:  PROVENTIL HFA;VENTOLIN HFA Inhale 1-2 puffs into the lungs every 6 (six) hours as needed for wheezing or shortness of breath.   albuterol (2.5 MG/3ML) 0.083% nebulizer solution Commonly known as:  PROVENTIL Take 3 mLs (2.5 mg total) by nebulization every 6 (six) hours as needed for wheezing or shortness of breath.   PROAIR HFA 108 (90 Base) MCG/ACT inhaler Generic drug:  albuterol INHALE 2 PUFFS BY MOUTH EVERY 4 HOURS AS NEEDED   glycopyrrolate 1 MG tablet Commonly known as:  ROBINUL Take 2 tablets (2 mg total) by mouth 3 (three) times daily.   promethazine 25 MG tablet Commonly known as:  PHENERGAN Take 1 tablet (25 mg total) by mouth every 6 (six) hours as needed for nausea or vomiting.        Steward DroneVeronica Lacara Dunsworth  Certified Nurse-Midwife 12/31/2016  5:50 PM

## 2017-01-01 LAB — GC/CHLAMYDIA PROBE AMP (~~LOC~~) NOT AT ARMC
Chlamydia: NEGATIVE
Neisseria Gonorrhea: NEGATIVE

## 2017-01-07 ENCOUNTER — Inpatient Hospital Stay (HOSPITAL_COMMUNITY)
Admission: AD | Admit: 2017-01-07 | Discharge: 2017-01-08 | Disposition: A | Discharge status: Home / Self Care | Payer: BLUE CROSS/BLUE SHIELD | Source: Ambulatory Visit | Attending: Obstetrics & Gynecology | Admitting: Obstetrics & Gynecology

## 2017-01-07 ENCOUNTER — Encounter (HOSPITAL_COMMUNITY): Payer: Self-pay | Admitting: *Deleted

## 2017-01-07 DIAGNOSIS — O219 Vomiting of pregnancy, unspecified: Secondary | ICD-10-CM | POA: Diagnosis present

## 2017-01-07 DIAGNOSIS — O99281 Endocrine, nutritional and metabolic diseases complicating pregnancy, first trimester: Secondary | ICD-10-CM | POA: Insufficient documentation

## 2017-01-07 DIAGNOSIS — E860 Dehydration: Secondary | ICD-10-CM | POA: Diagnosis not present

## 2017-01-07 DIAGNOSIS — Z3A10 10 weeks gestation of pregnancy: Secondary | ICD-10-CM

## 2017-01-07 LAB — COMPREHENSIVE METABOLIC PANEL
ALK PHOS: 63 U/L (ref 38–126)
ALT: 13 U/L — AB (ref 14–54)
AST: 19 U/L (ref 15–41)
Albumin: 3.4 g/dL — ABNORMAL LOW (ref 3.5–5.0)
Anion gap: 12 (ref 5–15)
BUN: 7 mg/dL (ref 6–20)
CALCIUM: 9.3 mg/dL (ref 8.9–10.3)
CO2: 22 mmol/L (ref 22–32)
CREATININE: 0.6 mg/dL (ref 0.44–1.00)
Chloride: 101 mmol/L (ref 101–111)
GFR calc Af Amer: >60 mL/min (ref >60)
GFR calc non Af Amer: >60 mL/min (ref >60)
GLUCOSE: 92 mg/dL (ref 65–99)
Potassium: 3.4 mmol/L — ABNORMAL LOW (ref 3.5–5.1)
SODIUM: 135 mmol/L (ref 135–145)
Total Bilirubin: 0.7 mg/dL (ref 0.3–1.2)
Total Protein: 7.9 g/dL (ref 6.5–8.1)

## 2017-01-07 LAB — URINALYSIS, ROUTINE W REFLEX MICROSCOPIC
Glucose, UA: NEGATIVE mg/dL
Hgb urine dipstick: NEGATIVE
KETONES UR: 80 mg/dL — AB
Leukocytes, UA: NEGATIVE
Nitrite: NEGATIVE
PH: 6 (ref 5.0–8.0)
PROTEIN: 30 mg/dL — AB
RBC / HPF: NONE SEEN RBC/hpf (ref 0–5)
Specific Gravity, Urine: 1.032 — ABNORMAL HIGH (ref 1.005–1.030)

## 2017-01-07 LAB — CBC
HCT: 35.5 % — ABNORMAL LOW (ref 36.0–46.0)
HEMOGLOBIN: 11.7 g/dL — AB (ref 12.0–15.0)
MCH: 27.5 pg (ref 26.0–34.0)
MCHC: 33 g/dL (ref 30.0–36.0)
MCV: 83.3 fL (ref 78.0–100.0)
Platelets: 353 10*3/uL (ref 150–400)
RBC: 4.26 MIL/uL (ref 3.87–5.11)
RDW: 14.8 % (ref 11.5–15.5)
WBC: 7.5 10*3/uL (ref 4.0–10.5)

## 2017-01-07 MED ORDER — GLYCOPYRROLATE 0.2 MG/ML IJ SOLN
0.1000 mg | Freq: Once | INTRAMUSCULAR | Status: AC
  Administered 2017-01-07: 0.1 mg via INTRAVENOUS
  Filled 2017-01-07: qty 0.5

## 2017-01-07 MED ORDER — SODIUM CHLORIDE 0.9 % IV SOLN
8.0000 mg | Freq: Once | INTRAVENOUS | Status: AC
  Administered 2017-01-07: 8 mg via INTRAVENOUS
  Filled 2017-01-07: qty 4

## 2017-01-07 MED ORDER — LACTATED RINGERS IV BOLUS (SEPSIS)
1000.0000 mL | Freq: Once | INTRAVENOUS | Status: AC
  Administered 2017-01-07: 1000 mL via INTRAVENOUS

## 2017-01-07 MED ORDER — FAMOTIDINE IN NACL 20-0.9 MG/50ML-% IV SOLN
20.0000 mg | Freq: Once | INTRAVENOUS | Status: AC
  Administered 2017-01-07: 20 mg via INTRAVENOUS
  Filled 2017-01-07: qty 50

## 2017-01-07 MED ORDER — M.V.I. ADULT IV INJ
Freq: Once | INTRAVENOUS | Status: AC
  Administered 2017-01-08: 01:00:00 via INTRAVENOUS
  Filled 2017-01-07: qty 10

## 2017-01-07 NOTE — MAU Provider Note (Signed)
History     CSN: [id number]  Arrival date and time: 01/07/17 2056   First Provider Initiated Contact with Patient 01/07/17 2212      Chief Complaint  Patient presents with  . Emesis  . Shortness of Breath  . Abdominal Pain   G3P1102 @10 .2 wks here with N/V and syncope episode. N/V has been ongoing x1 month. Cannot tolerate anything po. Has tried Phenergan and Robinul w/o relief. Had 4 lb weight loss over last week. No VB or pain. Had episode of syncope today. Gets SOB with vomiting episodes. Hx of asthma. Doesn't have inhaler.    OB History    Gravida Para Term Preterm AB Living   3 1   1   1    SAB TAB Ectopic Multiple Live Births           1      Past Medical History:  Diagnosis Date  . Anemia    low iron  . Anxiety   . Asthma   . Complication of anesthesia    bp dropped in middle of last c-section and started with n/v  . GERD (gastroesophageal reflux disease)    during pregnancy  . Headache    "couple days/week" (10/28/2015)  . Hyperthyroidism    not on medication  . PE (pulmonary embolism) 11/2011  . Pneumonia   . Preeclampsia   . Preeclampsia   . Pregnancy induced hypertension   . Preterm labor     Past Surgical History:  Procedure Laterality Date  . CESAREAN SECTION  2008; 2014  . FRACTURE SURGERY    . ORIF ANKLE FRACTURE Right 10/28/2015  . ORIF ANKLE FRACTURE Right 10/28/2015   Procedure: OPEN REDUCTION INTERNAL FIXATION (ORIF) RIGHT ANKLE FRACTURE;  Surgeon: Yolonda Kida, MD;  Location: [facility] OR;  Service: Orthopedics;  Laterality: Right;    Family History  Problem Relation Age of Onset  . Hypertension Maternal Grandfather   . Hypertension Paternal Grandmother   . Cancer Paternal Grandmother   . Hyperlipidemia Paternal Grandmother   . Hypertension Father   . Cancer Sister        skin  . Cancer Maternal Grandmother   . Rheum arthritis Mother   . Lupus Mother   . Hypothyroidism Maternal Aunt   . Lupus Other     Social History    Tobacco Use  . Smoking status: Never Smoker  . Smokeless tobacco: Never Used  Substance Use Topics  . Alcohol use: No  . Drug use: No    Allergies:  Allergies  Allergen Reactions  . Banana Anaphylaxis  . Cefixime Rash    Medications Prior to Admission  Medication Sig Dispense Refill Last Dose  . albuterol (PROVENTIL) (2.5 MG/3ML) 0.083% nebulizer solution Take 3 mLs (2.5 mg total) by nebulization every 6 (six) hours as needed for wheezing or shortness of breath. 75 mL 12 01/07/2017 at Unknown time  . glycopyrrolate (ROBINUL) 1 MG tablet Take 2 tablets (2 mg total) by mouth 3 (three) times daily. 90 tablet 3 01/07/2017 at Unknown time  . promethazine (PHENERGAN) 25 MG tablet Take 1 tablet (25 mg total) by mouth every 6 (six) hours as needed for nausea or vomiting. 30 tablet 1 01/07/2017 at Unknown time  . albuterol (PROVENTIL HFA;VENTOLIN HFA) 108 (90 Base) MCG/ACT inhaler Inhale 1-2 puffs into the lungs every 6 (six) hours as needed for wheezing or shortness of breath.   Unknown at Unknown time  . PROAIR HFA 108 (90 Base) MCG/ACT inhaler INHALE  2 PUFFS BY MOUTH EVERY 4 HOURS AS NEEDED 1 Inhaler 0 Unknown at Unknown time    Review of Systems  Constitutional: Negative for fever.  Respiratory: Positive for shortness of breath (with vomiting).   Gastrointestinal: Positive for constipation, nausea and vomiting.  Genitourinary: Negative for vaginal bleeding.   Physical Exam   Blood pressure 111/64, pulse 98, temperature 99.3 F (37.4 C), temperature source Oral, resp. rate 18, height 5\' 5"  (1.651 m), weight 211 lb (95.7 kg), last menstrual period 10/14/2016.  Physical Exam  Nursing note and vitals reviewed. Constitutional: She is oriented to person, place, and time. She appears well-developed and well-nourished. No distress.  HENT:  Head: Normocephalic and atraumatic.  Neck: Normal range of motion.  Cardiovascular: Normal rate.  Respiratory: Effort normal. No respiratory distress.  She has no wheezes. She has no rales.  GI: Soft. She exhibits no distension and no mass. There is no tenderness. There is no rebound and no guarding.  Musculoskeletal: Normal range of motion.  Neurological: She is alert and oriented to person, place, and time.  Skin: Skin is warm and dry.  Psychiatric: She has a normal mood and affect.    Results for orders placed or performed during the hospital encounter of 01/07/17 (from the past 24 hour(s))  Urinalysis, Routine w reflex microscopic     Status: Abnormal   Collection Time: 01/07/17  9:26 PM  Result Value Ref Range   Color, Urine AMBER (A) YELLOW   APPearance HAZY (A) CLEAR   Specific Gravity, Urine 1.032 (H) 1.005 - 1.030   pH 6.0 5.0 - 8.0   Glucose, UA NEGATIVE NEGATIVE mg/dL   Hgb urine dipstick NEGATIVE NEGATIVE   Bilirubin Urine SMALL (A) NEGATIVE   Ketones, ur 80 (A) NEGATIVE mg/dL   Protein, ur 30 (A) NEGATIVE mg/dL   Nitrite NEGATIVE NEGATIVE   Leukocytes, UA NEGATIVE NEGATIVE   RBC / HPF NONE SEEN 0 - 5 RBC/hpf   WBC, UA 0-5 0 - 5 WBC/hpf   Bacteria, UA RARE (A) NONE SEEN   Squamous Epithelial / LPF 0-5 (A) NONE SEEN   Mucus PRESENT   CBC     Status: Abnormal   Collection Time: 01/07/17 10:53 PM  Result Value Ref Range   WBC 7.5 4.0 - 10.5 K/uL   RBC 4.26 3.87 - 5.11 MIL/uL   Hemoglobin 11.7 (L) 12.0 - 15.0 g/dL   HCT 16.135.5 (L) 09.636.0 - 04.546.0 %   MCV 83.3 78.0 - 100.0 fL   MCH 27.5 26.0 - 34.0 pg   MCHC 33.0 30.0 - 36.0 g/dL   RDW 40.914.8 81.111.5 - 91.415.5 %   Platelets 353 150 - 400 K/uL  Comprehensive metabolic panel     Status: Abnormal   Collection Time: 01/07/17 10:53 PM  Result Value Ref Range   Sodium 135 135 - 145 mmol/L   Potassium 3.4 (L) 3.5 - 5.1 mmol/L   Chloride 101 101 - 111 mmol/L   CO2 22 22 - 32 mmol/L   Glucose, Bld 92 65 - 99 mg/dL   BUN 7 6 - 20 mg/dL   Creatinine, Ser 7.820.60 0.44 - 1.00 mg/dL   Calcium 9.3 8.9 - 95.610.3 mg/dL   Total Protein 7.9 6.5 - 8.1 g/dL   Albumin 3.4 (L) 3.5 - 5.0 g/dL   AST  19 15 - 41 U/L   ALT 13 (L) 14 - 54 U/L   Alkaline Phosphatase 63 38 - 126 U/L   Total Bilirubin 0.7 0.3 -  1.2 mg/dL   GFR calc non Af Amer >60 >60 mL/min   GFR calc Af Amer >60 >60 mL/min   Anion gap 12 5 - 15   Limited bedside US: viable, active fetus, +cardiac activity, subj. nml AFV  MAU Course  Procedures Zofran Robinul LR bolus MTV IV Regan  MDM Labs ordered and reviewed. Lungs CTAB but pt reports chest feels tight, will Rx inhaler for prn use. Tolerating po after Reglan. EKG- interpretation by Dr. Despina Hidden >no concerns, probable lead malplacement. Stable for discharge.  Assessment and Plan   1. [redacted] weeks gestation of pregnancy   2. Nausea/vomiting in pregnancy   3. Dehydration    Discharge home Follow up at [facility] as scheduled Rx B6 Rx Unisom Rx Reglan HEG diet   Allergies as of 01/08/2017      Reactions   Banana Anaphylaxis   Cefixime Rash      Medication List    STOP taking these medications   promethazine 25 MG tablet Commonly known as:  PHENERGAN     TAKE these medications   albuterol 108 (90 Base) MCG/ACT inhaler Commonly known as:  PROVENTIL HFA;VENTOLIN HFA Inhale 1-2 puffs into the lungs every 6 (six) hours as needed for wheezing or shortness of breath. What changed:  Another medication with the same name was removed. Continue taking this medication, and follow the directions you see here.   doxylamine (Sleep) 25 MG tablet Commonly known as:  UNISOM Take 0.5-1 tablets (12.5-25 mg total) by mouth at bedtime.   glycopyrrolate 1 MG tablet Commonly known as:  ROBINUL Take 2 tablets (2 mg total) by mouth 3 (three) times daily.   metoCLOPramide 10 MG tablet Commonly known as:  REGLAN Take 1 tablet (10 mg total) by mouth every 6 (six) hours as needed for nausea or vomiting.   pyridOXINE 50 MG tablet Commonly known as:  VITAMIN B-6 Take 1 tablet (50 mg total) by mouth every 6 (six) hours.      Donette Larry, CNM 01/07/2017, 10:23 PM

## 2017-01-07 NOTE — MAU Note (Signed)
Pt presents to MAU c/o abdominal cramping that started last week. Pt states she was evaluated last week for bleeding and it was "nothing" pt also reports vomiting x445mo (pt reports it was a countless amt of vomitting today.) pt states she vomits water, ice, and everything. Pt appears to be spitting now. Pt reports having asthma and reports SOB that occurs when she vomits but it hasnt gone away since lunch time. Pt states she passed out or blacked out at home and fell on her knees against the wall pt denies hitting head or belly.

## 2017-01-08 DIAGNOSIS — Z3A10 10 weeks gestation of pregnancy: Secondary | ICD-10-CM | POA: Diagnosis not present

## 2017-01-08 DIAGNOSIS — O219 Vomiting of pregnancy, unspecified: Secondary | ICD-10-CM | POA: Diagnosis not present

## 2017-01-08 MED ORDER — METOCLOPRAMIDE HCL 5 MG/ML IJ SOLN
10.0000 mg | Freq: Once | INTRAMUSCULAR | Status: AC
  Administered 2017-01-08: 10 mg via INTRAVENOUS
  Filled 2017-01-08: qty 2

## 2017-01-08 MED ORDER — DOXYLAMINE SUCCINATE (SLEEP) 25 MG PO TABS: 12.5000 mg | ORAL_TABLET | Freq: Every day | ORAL | 0 refills | Status: DC

## 2017-01-08 MED ORDER — METOCLOPRAMIDE HCL 10 MG PO TABS: 10.0000 mg | ORAL_TABLET | Freq: Four times a day (QID) | ORAL | 0 refills | Status: DC | PRN

## 2017-01-08 MED ORDER — ALBUTEROL SULFATE (2.5 MG/3ML) 0.083% IN NEBU
3.0000 mL | INHALATION_SOLUTION | Freq: Four times a day (QID) | RESPIRATORY_TRACT | Status: DC | PRN
  Filled 2017-01-08: qty 3

## 2017-01-08 MED ORDER — ALBUTEROL SULFATE (2.5 MG/3ML) 0.083% IN NEBU
3.0000 mL | INHALATION_SOLUTION | Freq: Four times a day (QID) | RESPIRATORY_TRACT | Status: DC | PRN
Start: 2017-01-08 — End: 2017-01-08

## 2017-01-08 MED ORDER — ALBUTEROL SULFATE HFA 108 (90 BASE) MCG/ACT IN AERS: 1.0000 | INHALATION_SPRAY | Freq: Four times a day (QID) | RESPIRATORY_TRACT | 0 refills | Status: DC | PRN

## 2017-01-08 MED ORDER — VITAMIN B-6 50 MG PO TABS: 50.0000 mg | ORAL_TABLET | Freq: Four times a day (QID) | ORAL | 0 refills | Status: DC

## 2017-01-08 NOTE — Discharge Instructions (Signed)
Eating Plan for Hyperemesis Gravidarum °Hyperemesis gravidarum is a severe form of morning sickness. Because this condition causes severe nausea and vomiting, it can lead to dehydration, malnutrition, and weight loss. One way to lessen the symptoms of nausea and vomiting is to follow the eating plan for hyperemesis gravidarum. It is often used along with prescribed medicines to control your symptoms. °What can I do to relieve my symptoms? °Listen to your body. Everyone is different and has different preferences. Find what works best for you. Take any of the following actions that are helpful to you: °· Eat and drink slowly. °· Eat 5-6 small meals daily instead of 3 large meals. °· Eat crackers before you get out of bed in the morning. °· Try having a snack in the middle of the night. °· Starchy foods are usually tolerated well. Examples include cereal, toast, bread, potatoes, pasta, rice, and pretzels. °· Ginger may help with nausea. Add ¼ tsp ground ginger to hot tea or choose ginger tea. °· Try drinking 100% fruit juice or an electrolyte drink. An electrolyte drink contains sodium, potassium, and chloride. °· Continue to take your prenatal vitamins as told by your health care provider. If you are having trouble taking your prenatal vitamins, talk with your health care provider about different options. °· Include at least 1 serving of protein with your meals and snacks. Protein options include meats or poultry, beans, nuts, eggs, and yogurt. Try eating a protein-rich snack before bed. Examples of these snacks include cheese and crackers or half of a peanut butter or turkey sandwich. °· Consider eliminating foods that trigger your symptoms. These may include spicy foods, coffee, high-fat foods, very sweet foods, and acidic foods. °· Try meals that have more protein combined with bland, salty, lower-fat, and dry foods, such as nuts, seeds, pretzels, crackers, and cereal. °· Talk with your healthcare provider about  starting a supplement of vitamin B6. °· Have fluids that are cold, clear, and carbonated or sour. Examples include lemonade, ginger ale, lemon-lime soda, ice water, and sparkling water. °· Try lemon or mint tea. °· Try brushing your teeth or using a mouth rinse after meals. ° °What should I avoid to reduce my symptoms? °Avoiding some of the following things may help reduce your symptoms. °· Foods with strong smells. Try eating meals in well-ventilated areas that are free of odors. °· Drinking water or other beverages with meals. Try not to drink anything during the 30 minutes before and after your meals. °· Drinking more than 1 cup of fluid at a time. Sometimes using a straw helps. °· Fried or high-fat foods, such as butter and cream sauces. °· Spicy foods. °· Skipping meals as best as you can. Nausea can be more intense on an empty stomach. If you cannot tolerate food at that time, do not force it. Try sucking on ice chips or other frozen items, and make up for missed calories later. °· Lying down within 2 hours after eating. °· Environmental triggers. These may include smoky rooms, closed spaces, rooms with strong smells, warm or humid places, overly loud and noisy rooms, and rooms with motion or flickering lights. °· Quick and sudden changes in your movement. ° °This information is not intended to replace advice given to you by your health care provider. Make sure you discuss any questions you have with your health care provider. °Document Released: 10/16/2006 Document Revised: 08/18/2015 Document Reviewed: 07/20/2015 °Elsevier Interactive Patient Education © 2018 [employer] °Morning Sickness °Morning sickness   is when you feel sick to your stomach (nauseous) during pregnancy. You may feel sick to your stomach and throw up (vomit). You may feel sick in the morning, but you can feel this way any time of day. Some women feel very sick to their stomach and cannot stop throwing up (hyperemesis gravidarum). °Follow  these instructions at home: °· Only take medicines as told by your doctor. °· Take multivitamins as told by your doctor. Taking multivitamins before getting pregnant can stop or lessen the harshness of morning sickness. °· Eat dry toast or unsalted crackers before getting out of bed. °· Eat 5 to 6 small meals a day. °· Eat dry and bland foods like rice and baked potatoes. °· Do not drink liquids with meals. Drink between meals. °· Do not eat greasy, fatty, or spicy foods. °· Have someone cook for you if the smell of food causes you to feel sick or throw up. °· If you feel sick to your stomach after taking prenatal vitamins, take them at night or with a snack. °· Eat protein when you need a snack (nuts, yogurt, cheese). °· Eat unsweetened gelatins for dessert. °· Wear a bracelet used for sea sickness (acupressure wristband). °· Go to a doctor that puts thin needles into certain body points (acupuncture) to improve how you feel. °· Do not smoke. °· Use a humidifier to keep the air in your house free of odors. °· Get lots of fresh air. °Contact a doctor if: °· You need medicine to feel better. °· You feel dizzy or lightheaded. °· You are losing weight. °Get help right away if: °· You feel very sick to your stomach and cannot stop throwing up. °· You pass out (faint). °This information is not intended to replace advice given to you by your health care provider. Make sure you discuss any questions you have with your health care provider. °Document Released: 01/27/2004 Document Revised: 05/27/2015 Document Reviewed: 06/05/2012 °Elsevier Interactive Patient Education © 2017 [employer] Inc[employer] ° °

## 2017-01-17 ENCOUNTER — Encounter: Payer: BLUE CROSS/BLUE SHIELD | Admitting: Certified Nurse Midwife

## 2017-01-27 ENCOUNTER — Encounter (HOSPITAL_COMMUNITY): Payer: Self-pay

## 2017-01-27 ENCOUNTER — Other Ambulatory Visit: Payer: Self-pay

## 2017-01-27 DIAGNOSIS — J4521 Mild intermittent asthma with (acute) exacerbation: Secondary | ICD-10-CM | POA: Diagnosis not present

## 2017-01-27 DIAGNOSIS — Z3A13 13 weeks gestation of pregnancy: Secondary | ICD-10-CM | POA: Diagnosis not present

## 2017-01-27 DIAGNOSIS — Z79899 Other long term (current) drug therapy: Secondary | ICD-10-CM | POA: Diagnosis not present

## 2017-01-27 DIAGNOSIS — O99512 Diseases of the respiratory system complicating pregnancy, second trimester: Secondary | ICD-10-CM | POA: Insufficient documentation

## 2017-01-27 NOTE — ED Triage Notes (Signed)
States for about one month shortness of breath with productive cough yellowish color able to speak in full sentences.

## 2017-01-28 ENCOUNTER — Emergency Department (HOSPITAL_COMMUNITY)
Admission: EM | Admit: 2017-01-28 | Discharge: 2017-01-28 | Disposition: A | Discharge status: Home / Self Care | Payer: BLUE CROSS/BLUE SHIELD | Attending: Emergency Medicine | Admitting: Emergency Medicine

## 2017-01-28 DIAGNOSIS — J4521 Mild intermittent asthma with (acute) exacerbation: Secondary | ICD-10-CM

## 2017-01-28 DIAGNOSIS — O99512 Diseases of the respiratory system complicating pregnancy, second trimester: Secondary | ICD-10-CM | POA: Diagnosis not present

## 2017-01-28 DIAGNOSIS — J40 Bronchitis, not specified as acute or chronic: Secondary | ICD-10-CM

## 2017-01-28 MED ORDER — PREDNISONE 20 MG PO TABS
60.0000 mg | ORAL_TABLET | Freq: Once | ORAL | Status: AC
  Administered 2017-01-28: 60 mg via ORAL
  Filled 2017-01-28: qty 3

## 2017-01-28 MED ORDER — AZITHROMYCIN 250 MG PO TABS: 250.0000 mg | ORAL_TABLET | Freq: Every day | ORAL | 0 refills | Status: DC

## 2017-01-28 MED ORDER — ALBUTEROL SULFATE HFA 108 (90 BASE) MCG/ACT IN AERS
2.0000 | INHALATION_SPRAY | RESPIRATORY_TRACT | Status: DC | PRN
  Administered 2017-01-28: 2 via RESPIRATORY_TRACT
  Filled 2017-01-28: qty 6.7

## 2017-01-28 MED ORDER — AZITHROMYCIN 250 MG PO TABS
500.0000 mg | ORAL_TABLET | Freq: Once | ORAL | Status: AC
  Administered 2017-01-28: 500 mg via ORAL
  Filled 2017-01-28: qty 2

## 2017-01-28 MED ORDER — PREDNISONE 20 MG PO TABS: 40.0000 mg | ORAL_TABLET | Freq: Every day | ORAL | 0 refills | Status: DC

## 2017-01-28 MED ORDER — ALBUTEROL SULFATE HFA 108 (90 BASE) MCG/ACT IN AERS: 2.0000 | INHALATION_SPRAY | RESPIRATORY_TRACT | 2 refills | Status: DC | PRN

## 2017-01-28 NOTE — ED Provider Notes (Signed)
[facility]-EMERGENCY DEPT Provider Note   CSN: [id number] Arrival date & time: 01/27/17  2234     History   Chief Complaint Chief Complaint  Patient presents with  . Shortness of Breath    HPI Debra Horn is a 32 y.o. female.  Patient presents to the emergency department for evaluation of cough nasal congestion and shortness of breath.  Symptoms ongoing for 1 month.  Patient is [redacted] weeks pregnant.  She has not been having any vaginal bleeding or discharge.  There is no associated chest pain.  Patient reports a history of asthma.  She has been using her inhaler excessively B because of her current symptoms, has run out of the inhaler.      Past Medical History:  Diagnosis Date  . Anemia    low iron  . Anxiety   . Asthma   . Complication of anesthesia    bp dropped in middle of last c-section and started with n/v  . GERD (gastroesophageal reflux disease)    during pregnancy  . Headache    "couple days/week" (10/28/2015)  . Hyperthyroidism    not on medication  . PE (pulmonary embolism) 11/2011  . Pneumonia   . Preeclampsia   . Preeclampsia   . Pregnancy induced hypertension   . Preterm labor     Patient Active Problem List   Diagnosis Date Noted  . Moderate asthma with exacerbation   . Influenza A 02/06/2016  . Normocytic anemia 02/06/2016  . Hypokalemia 02/06/2016  . SIRS (systemic inflammatory response syndrome) (HCC) 02/06/2016  . Ankle fracture, lateral malleolus, closed 10/28/2015  . Dizziness 02/19/2014  . Family history of systemic lupus erythematosus (SLE) in mother 02/19/2014  . Asthma exacerbation 02/19/2014  . Hyperthyroidism 02/19/2014  . Preterm NB deliv by C-section, 1,750-1,999 gm, 31-32 completed weeks 08/08/2012  . Previous cesarean section 08/08/2012  . Hx pulmonary embolism 07/24/2012  . Supervision of high-risk pregnancy 07/24/2012    Past Surgical History:  Procedure Laterality Date  . CESAREAN SECTION   2008; 2014  . FRACTURE SURGERY    . ORIF ANKLE FRACTURE Right 10/28/2015  . ORIF ANKLE FRACTURE Right 10/28/2015   Procedure: OPEN REDUCTION INTERNAL FIXATION (ORIF) RIGHT ANKLE FRACTURE;  Surgeon: Yolonda KidaJason Patrick Rogers, MD;  [facility];  Service: Orthopedics;  Laterality: Right;    OB History    Gravida Para Term Preterm AB Living   3 1   1   1    SAB TAB Ectopic Multiple Live Births           1       Home Medications    Prior to Admission medications   Medication Sig Start Date End Date Taking? Authorizing Provider  albuterol (PROVENTIL HFA;VENTOLIN HFA) 108 (90 Base) MCG/ACT inhaler Inhale 1-2 puffs into the lungs every 6 (six) hours as needed for wheezing or shortness of breath. 01/08/17   Donette LarryBhambri, Melanie, CNM  albuterol (PROVENTIL HFA;VENTOLIN HFA) 108 (90 Base) MCG/ACT inhaler Inhale 2 puffs into the lungs every 4 (four) hours as needed for wheezing or shortness of breath. 01/28/17   Idabelle Mcpeters, Canary Brimhristopher J, MD  azithromycin (ZITHROMAX) 250 MG tablet Take 1 tablet (250 mg total) by mouth daily. 01/28/17   Gilda CreasePollina, Rhet Rorke J, MD  doxylamine, Sleep, (UNISOM) 25 MG tablet Take 0.5-1 tablets (12.5-25 mg total) by mouth at bedtime. 01/08/17   Donette LarryBhambri, Melanie, CNM  glycopyrrolate (ROBINUL) 1 MG tablet Take 2 tablets (2 mg total) by mouth 3 (three) times  daily. 12/31/16   Sharyon Cable, CNM  metoCLOPramide (REGLAN) 10 MG tablet Take 1 tablet (10 mg total) by mouth every 6 (six) hours as needed for nausea or vomiting. 01/08/17   Donette Larry, CNM  predniSONE (DELTASONE) 20 MG tablet Take 2 tablets (40 mg total) by mouth daily with breakfast. 01/28/17   Donis Kotowski, Canary Brim, MD  pyridOXINE (VITAMIN B-6) 50 MG tablet Take 1 tablet (50 mg total) by mouth every 6 (six) hours. 01/08/17   Donette Larry, CNM    Family History Family History  Problem Relation Age of Onset  . Hypertension Maternal Grandfather   . Hypertension Paternal Grandmother   . Cancer Paternal Grandmother     . Hyperlipidemia Paternal Grandmother   . Hypertension Father   . Cancer Sister        skin  . Cancer Maternal Grandmother   . Rheum arthritis Mother   . Lupus Mother   . Hypothyroidism Maternal Aunt   . Lupus Other     Social History Social History   Tobacco Use  . Smoking status: Never Smoker  . Smokeless tobacco: Never Used  Substance Use Topics  . Alcohol use: No  . Drug use: No     Allergies   Banana and Cefixime   Review of Systems Review of Systems  Respiratory: Positive for cough, shortness of breath and wheezing.   All other systems reviewed and are negative.    Physical Exam Updated Vital Signs BP 121/84   Pulse 72   Temp (!) 97.5 F (36.4 C) (Oral)   Resp 18   Ht 5\' 3"  (1.6 m)   Wt 93.9 kg (207 lb)   LMP 10/14/2016   SpO2 100%   BMI 36.67 kg/m   Physical Exam  Constitutional: She is oriented to person, place, and time. She appears well-developed and well-nourished. No distress.  HENT:  Head: Normocephalic and atraumatic.  Right Ear: Hearing normal.  Left Ear: Hearing normal.  Nose: Nose normal.  Mouth/Throat: Oropharynx is clear and moist and mucous membranes are normal.  Eyes: Conjunctivae and EOM are normal. Pupils are equal, round, and reactive to light.  Neck: Normal range of motion. Neck supple.  Cardiovascular: Regular rhythm, S1 normal and S2 normal. Exam reveals no gallop and no friction rub.  No murmur heard. Pulmonary/Chest: Effort normal. No respiratory distress. She has decreased breath sounds. She has wheezes. She exhibits no tenderness.  Abdominal: Soft. Normal appearance and bowel sounds are normal. There is no hepatosplenomegaly. There is no tenderness. There is no rebound, no guarding, no tenderness at McBurney's point and negative Murphy's sign. No hernia.  Musculoskeletal: Normal range of motion.  Neurological: She is alert and oriented to person, place, and time. She has normal strength. No cranial nerve deficit or  sensory deficit. Coordination normal. GCS eye subscore is 4. GCS verbal subscore is 5. GCS motor subscore is 6.  Skin: Skin is warm, dry and intact. No rash noted. No cyanosis.  Psychiatric: She has a normal mood and affect. Her speech is normal and behavior is normal. Thought content normal.  Nursing note and vitals reviewed.    ED Treatments / Results  Labs (all labs ordered are listed, but only abnormal results are displayed) Labs Reviewed - No data to display  EKG  EKG Interpretation  Date/Time:  Saturday January 27 2017 22:40:58 EST Ventricular Rate:  92 PR Interval:    QRS Duration: 84 QT Interval:  326 QTC Calculation: 404 R Axis:  70 Text Interpretation:  Sinus rhythm Borderline short PR interval Borderline T abnormalities, inferior leads Confirmed by Gilda Crease [phone]) on 01/28/2017 1:54:15 AM       Radiology No results found.  Procedures Procedures (including critical care time)  Medications Ordered in ED Medications  predniSONE (DELTASONE) tablet 60 mg (not administered)  albuterol (PROVENTIL HFA;VENTOLIN HFA) 108 (90 Base) MCG/ACT inhaler 2 puff (not administered)  azithromycin (ZITHROMAX) tablet 500 mg (not administered)     Initial Impression / Assessment and Plan / ED Course  I have reviewed the triage vital signs and the nursing notes.  Pertinent labs & imaging results that were available during my care of the patient were reviewed by me and considered in my medical decision making (see chart for details).     Patient presents with cough, chest congestion ongoing for 1 month.  She has a history of asthma, has run out of her inhaler.  She is not tachycardic, tachypneic or hypoxic.  She does, however, have evidence of mild bronchospasm.  No evidence of pneumonia currently.  Symptoms ongoing for 1 month.  Will aggressively treat, within parameters of pregnancy, for acute bronchitis  Final Clinical Impressions(s) / ED Diagnoses   Final  diagnoses:  Mild intermittent asthma with exacerbation  Bronchitis    ED Discharge Orders        Ordered    predniSONE (DELTASONE) 20 MG tablet  Daily with breakfast     01/28/17 0311    albuterol (PROVENTIL HFA;VENTOLIN HFA) 108 (90 Base) MCG/ACT inhaler  Every 4 hours PRN     01/28/17 0311    azithromycin (ZITHROMAX) 250 MG tablet  Daily     01/28/17 0311       Gilda Crease, MD 01/28/17 [phone]

## 2017-02-09 ENCOUNTER — Encounter: Payer: Self-pay | Admitting: Certified Nurse Midwife

## 2017-02-09 ENCOUNTER — Ambulatory Visit (INDEPENDENT_AMBULATORY_CARE_PROVIDER_SITE_OTHER): Payer: BLUE CROSS/BLUE SHIELD | Admitting: Certified Nurse Midwife

## 2017-02-09 ENCOUNTER — Other Ambulatory Visit (HOSPITAL_COMMUNITY)
Admission: RE | Admit: 2017-02-09 | Discharge: 2017-02-09 | Disposition: A | Discharge status: Home / Self Care | Payer: BLUE CROSS/BLUE SHIELD | Source: Ambulatory Visit | Attending: Certified Nurse Midwife | Admitting: Certified Nurse Midwife

## 2017-02-09 VITALS — BP 121/84 | HR 89 | Temp 98.8°F | Wt 209.0 lb

## 2017-02-09 DIAGNOSIS — O0993 Supervision of high risk pregnancy, unspecified, third trimester: Secondary | ICD-10-CM

## 2017-02-09 DIAGNOSIS — O09299 Supervision of pregnancy with other poor reproductive or obstetric history, unspecified trimester: Secondary | ICD-10-CM | POA: Insufficient documentation

## 2017-02-09 DIAGNOSIS — Z8639 Personal history of other endocrine, nutritional and metabolic disease: Secondary | ICD-10-CM

## 2017-02-09 DIAGNOSIS — Z8751 Personal history of pre-term labor: Secondary | ICD-10-CM

## 2017-02-09 DIAGNOSIS — Z86711 Personal history of pulmonary embolism: Secondary | ICD-10-CM

## 2017-02-09 DIAGNOSIS — Z8709 Personal history of other diseases of the respiratory system: Secondary | ICD-10-CM

## 2017-02-09 DIAGNOSIS — O09212 Supervision of pregnancy with history of pre-term labor, second trimester: Secondary | ICD-10-CM

## 2017-02-09 DIAGNOSIS — Z98891 History of uterine scar from previous surgery: Secondary | ICD-10-CM

## 2017-02-09 DIAGNOSIS — Z8759 Personal history of other complications of pregnancy, childbirth and the puerperium: Secondary | ICD-10-CM

## 2017-02-09 DIAGNOSIS — O0992 Supervision of high risk pregnancy, unspecified, second trimester: Secondary | ICD-10-CM | POA: Diagnosis not present

## 2017-02-09 MED ORDER — GLYCOPYRROLATE 1 MG PO TABS: 1.0000 mg | ORAL_TABLET | Freq: Three times a day (TID) | ORAL | 2 refills | Status: DC

## 2017-02-09 MED ORDER — ASPIRIN 81 MG PO CHEW: 81.0000 mg | CHEWABLE_TABLET | Freq: Every day | ORAL | 12 refills | Status: DC

## 2017-02-09 MED ORDER — ENOXAPARIN SODIUM 30 MG/0.3ML ~~LOC~~ SOLN: 30.0000 mg | Freq: Two times a day (BID) | SUBCUTANEOUS | 12 refills | Status: DC

## 2017-02-09 MED ORDER — VITAFOL-NANO 18-0.6-0.4 MG PO TABS: 1.0000 | ORAL_TABLET | Freq: Every day | ORAL | 12 refills | Status: DC

## 2017-02-09 MED ORDER — ALBUTEROL SULFATE HFA 108 (90 BASE) MCG/ACT IN AERS: 2.0000 | INHALATION_SPRAY | Freq: Four times a day (QID) | RESPIRATORY_TRACT | 1 refills | Status: DC | PRN

## 2017-02-09 NOTE — Progress Notes (Signed)
Subjective:   Debra Horn is a 32 y.o. G3P0101 [facility].  Her obstetrical history is significant for obesity, pregnancy induced hypertension and 2 previous early 339& 34 week C-sections, Hx of PE was on Lovenox last pregnancy, asthma: not well controlled is using inhailer every evening, recent pneumonia last week and is taking Z-pack; reports improvement after antibiotics, declines flu shot today, has not had pneumovax. Patient does not intend to breast feed. Pregnancy history fully reviewed.  Currently on leave from work from [employer] d/t severe N&V early in this pregnancy.  Not sure of status of FMLA paperwork.    Patient reports nausea, no bleeding, no contractions, no cramping, no leaking and spitting.  HISTORY: Obstetric History   G3   P1   T0   P1   A0   L1    SAB0   TAB0   Ectopic0   Multiple0   Live Births1     # Outcome Date GA Lbr Len/2nd Weight Sex Delivery Anes PTL Lv  3 Current           2 Gravida 2014     CS-Unspec     1 Preterm 2008 360w0d M CS-Unspec  Y LIV      Last pap smear was done unknown.    Past Medical History:  Diagnosis Date  . Anemia    low iron  . Anxiety   . Asthma   . Complication of anesthesia    bp dropped in middle of last c-section and started with n/v  . GERD (gastroesophageal reflux disease)    during pregnancy  . Headache    "couple days/week" (10/28/2015)  . Hyperthyroidism    not on medication  . PE (pulmonary embolism) 11/2011  . Pregnancy induced hypertension   . Preterm labor    Past Surgical History:  Procedure Laterality Date  . CESAREAN SECTION  2008; 2014  . FRACTURE SURGERY    . ORIF ANKLE FRACTURE Right 10/28/2015  . ORIF ANKLE FRACTURE Right 10/28/2015   Procedure: OPEN REDUCTION INTERNAL FIXATION (ORIF) RIGHT ANKLE FRACTURE;  Surgeon: JaNicholes StairsMD;  [place] [facility] [place];  Laterality: Right;   Family  History  Problem Relation Age of Onset  . Hypertension Maternal Grandfather   . Hypertension Paternal Grandmother   . Cancer Paternal Grandmother   . Hyperlipidemia Paternal Grandmother   . Hypertension Father   . Cancer Sister        skin  . Cancer Maternal Grandmother   . Rheum arthritis Mother   . Lupus Mother   . Hypothyroidism Maternal Aunt   . Lupus Other    Social History   Tobacco Use  . Smoking status: Never Smoker  . Smokeless tobacco: Never Used  Substance Use Topics  . Alcohol use: No  . Drug use: No   Allergies  Allergen Reactions  . Banana Anaphylaxis  . Cefixime Rash   Current Outpatient Medications on File Prior to Visit  Medication Sig Dispense Refill  . azithromycin (ZITHROMAX) 250 MG tablet Take 1 tablet (250 mg total) by mouth daily. 4 tablet 0   No current facility-administered medications on file prior to visit.     Review of Systems Pertinent items noted in HPI and remainder of comprehensive ROS otherwise negative.  Exam   Vitals:   02/09/17 1041  BP: 121/84  Pulse:  89  Temp: 98.8 F (37.1 C)  Weight: 209 lb (94.8 kg)   Fetal Heart Rate (bpm): 158; doppler  Uterus:  Fundal Height: 16 cm  Pelvic Exam: Perineum: no hemorrhoids, normal perineum   Vulva: normal external genitalia, no lesions   Vagina:  normal mucosa, normal discharge   Cervix: no lesions and normal, pap smear done.    Adnexa: normal adnexa and no mass, fullness, tenderness   Bony Pelvis: average  System: General: well-developed, well-nourished female in no acute distress   Breast:  normal appearance, no masses or tenderness   Skin: normal coloration and turgor, no rashes   Neurologic: oriented, normal, negative, normal mood   Extremities: normal strength, tone, and muscle mass, ROM of all joints is normal   HEENT PERRLA, extraocular movement intact and sclera clear, anicteric   Mouth/Teeth mucous membranes moist, pharynx normal without lesions and dental hygiene good     Neck supple and no masses   Cardiovascular: regular rate and rhythm   Respiratory:  no respiratory distress, normal breath sounds   Abdomen: soft, non-tender; bowel sounds normal; no masses,  no organomegaly; obtunded     Assessment:   Pregnancy: G3P0101 Patient Active Problem List   Diagnosis Date Noted  . History of pregnancy induced hypertension 02/09/2017  . History of asthma   . Normocytic anemia 02/06/2016  . SIRS (systemic inflammatory response syndrome) ([place]) 02/06/2016  . Ankle fracture, lateral malleolus, closed 10/28/2015  . Family history of systemic lupus erythematosus (SLE) in mother 02/19/2014  . Hyperthyroidism 02/19/2014  . Preterm NB deliv by C-section, 1,750-1,999 gm, 31-32 completed weeks 08/08/2012  . Previous cesarean section 08/08/2012  . Hx pulmonary embolism 07/24/2012  . Supervision of high-risk pregnancy 07/24/2012     Plan:  1. Supervision of high risk pregnancy in third trimester      POC reviewed with Dr. Jodi Mourning.  - Cervicovaginal ancillary only - Culture, OB Urine - Cytology - PAP - Genetic Screening - Hemoglobin A1c - Hemoglobinopathy evaluation - Obstetric Panel, Including HIV - VITAMIN D 25 Hydroxy (Vit-D Deficiency, Fractures) - Cystic Fibrosis Mutation 97 - Comp Met (CMET) - Protein / creatinine ratio, urine - [place] MFM OB DETAIL +14 WK; Future - glycopyrrolate (ROBINUL) 1 MG tablet; Take 1 tablet (1 mg total) by mouth 3 (three) times daily.  Dispense: 30 tablet; Refill: 2 - AFP, Serum, Open Spina Bifida - Prenatal-Fe Fum-Methf-FA w/o A (VITAFOL-NANO) 18-0.6-0.4 MG TABS; Take 1 tablet by mouth daily.  Dispense: 30 tablet; Refill: 12 - AMB referral to maternal fetal medicine  2. History of pregnancy induced hypertension    - [place] MFM OB DETAIL +14 WK; Future  3. History of asthma     Uncontrolled currently using albuterol daily - Ambulatory referral to Pulmonology - albuterol (PROVENTIL HFA;VENTOLIN HFA) 108 (90 Base) MCG/ACT  inhaler; Inhale 2 puffs into the lungs every 6 (six) hours as needed for wheezing or shortness of breath.  Dispense: 18 g; Refill: 1 - [place] MFM OB DETAIL +14 WK; Future  4. Previous cesarean section     Repeat C-section  - [place] MFM OB DETAIL +14 WK; Future  5. Hx pulmonary embolism      - enoxaparin (LOVENOX) 30 MG/0.3ML injection; Inject 0.3 mLs (30 mg total) into the skin every 12 (twelve) hours.  Dispense: 30 Syringe; Refill: 12 - [place] MFM OB DETAIL +14 WK; Future   6. History of hyperthyroidism     - Ambulatory referral to Endocrinology - TSH Pregnancy  7. History of preterm delivery     ?17-P, seems like deliveries were for severe Pre E.  ROI from last delivery needed.    Initial labs drawn. Continue prenatal vitamins. Genetic Screening discussed, NIPS: ordered. Ultrasound discussed; fetal anatomic survey: ordered. Problem list reviewed and updated. The nature of [place] with multiple MDs and other Advanced Practice Providers was explained to patient; also emphasized that residents, students are part of our team. Routine obstetric precautions reviewed. Return in about 2 weeks (around 02/23/2017) for [facility], Needs to see FP MD here.     Kandis Cocking, [place] for [employer], [place]

## 2017-02-10 LAB — PROTEIN / CREATININE RATIO, URINE
Creatinine, Urine: 420.9 mg/dL
PROTEIN/CREAT RATIO: 720 mg/g{creat} — AB (ref 0–200)
Protein, Ur: 303.2 mg/dL

## 2017-02-10 LAB — VITAMIN D 25 HYDROXY (VIT D DEFICIENCY, FRACTURES): VIT D 25 HYDROXY: 8.4 ng/mL — AB (ref 30.0–100.0)

## 2017-02-11 LAB — CULTURE, OB URINE

## 2017-02-11 LAB — URINE CULTURE, OB REFLEX

## 2017-02-12 LAB — CERVICOVAGINAL ANCILLARY ONLY
Bacterial vaginitis: POSITIVE — AB
CHLAMYDIA, DNA PROBE: NEGATIVE
Candida vaginitis: NEGATIVE
Neisseria Gonorrhea: NEGATIVE
TRICH (WINDOWPATH): NEGATIVE

## 2017-02-13 LAB — CYTOLOGY - PAP
Diagnosis: NEGATIVE
HPV: NOT DETECTED

## 2017-02-13 LAB — AFP, SERUM, OPEN SPINA BIFIDA
AFP MoM: 0.54
AFP Value: 12.6 ng/mL
Gest. Age on Collection Date: 15 weeks
MATERNAL AGE AT EDD: 32 a
OSBR Risk 1 IN: 10000
Test Results:: NEGATIVE
Weight: 206 [lb_av]

## 2017-02-13 LAB — COMPREHENSIVE METABOLIC PANEL
ALK PHOS: 79 IU/L (ref 39–117)
ALT: 8 IU/L (ref 0–32)
AST: 11 IU/L (ref 0–40)
Albumin/Globulin Ratio: 1 — ABNORMAL LOW (ref 1.2–2.2)
Albumin: 3.6 g/dL (ref 3.5–5.5)
BUN/Creatinine Ratio: 8 — ABNORMAL LOW (ref 9–23)
BUN: 4 mg/dL — ABNORMAL LOW (ref 6–20)
Bilirubin Total: <0.2 mg/dL (ref 0.0–1.2)
CO2: 17 mmol/L — AB (ref 20–29)
CREATININE: 0.52 mg/dL — AB (ref 0.57–1.00)
Calcium: 9.4 mg/dL (ref 8.7–10.2)
Chloride: 105 mmol/L (ref 96–106)
GFR calc Af Amer: 147 mL/min/{1.73_m2} (ref >59)
GFR calc non Af Amer: 128 mL/min/{1.73_m2} (ref >59)
GLOBULIN, TOTAL: 3.5 g/dL (ref 1.5–4.5)
Glucose: 84 mg/dL (ref 65–99)
POTASSIUM: 3.9 mmol/L (ref 3.5–5.2)
SODIUM: 139 mmol/L (ref 134–144)
Total Protein: 7.1 g/dL (ref 6.0–8.5)

## 2017-02-13 LAB — OBSTETRIC PANEL, INCLUDING HIV
Antibody Screen: NEGATIVE
BASOS ABS: 0 10*3/uL (ref 0.0–0.2)
Basos: 0 %
EOS (ABSOLUTE): 0.5 10*3/uL — AB (ref 0.0–0.4)
Eos: 6 %
HIV SCREEN 4TH GENERATION: NONREACTIVE
Hematocrit: 34.5 % (ref 34.0–46.6)
Hemoglobin: 11.3 g/dL (ref 11.1–15.9)
Hepatitis B Surface Ag: NEGATIVE
Immature Grans (Abs): 0 10*3/uL (ref 0.0–0.1)
Immature Granulocytes: 0 %
LYMPHS ABS: 2.8 10*3/uL (ref 0.7–3.1)
Lymphs: 32 %
MCH: 27.8 pg (ref 26.6–33.0)
MCHC: 32.8 g/dL (ref 31.5–35.7)
MCV: 85 fL (ref 79–97)
MONOS ABS: 0.6 10*3/uL (ref 0.1–0.9)
Monocytes: 6 %
NEUTROS ABS: 4.9 10*3/uL (ref 1.4–7.0)
Neutrophils: 56 %
PLATELETS: 378 10*3/uL (ref 150–379)
RBC: 4.07 x10E6/uL (ref 3.77–5.28)
RDW: 16.3 % — AB (ref 12.3–15.4)
RPR: NONREACTIVE
Rh Factor: POSITIVE
Rubella Antibodies, IGG: 2.06 index (ref >0.99)
WBC: 8.8 10*3/uL (ref 3.4–10.8)

## 2017-02-13 LAB — HEMOGLOBINOPATHY EVALUATION
HEMOGLOBIN F QUANTITATION: 0 % (ref 0.0–2.0)
HGB A: 97.4 % (ref 96.4–98.8)
HGB C: 0 %
HGB S: 0 %
HGB VARIANT: 0 %
Hemoglobin A2 Quantitation: 2.6 % (ref 1.8–3.2)

## 2017-02-13 LAB — TSH PREGNANCY: TSH Pregnancy: 1.51 u[IU]/mL (ref 0.450–4.500)

## 2017-02-13 LAB — HEMOGLOBIN A1C
ESTIMATED AVERAGE GLUCOSE: 97 mg/dL
Hgb A1c MFr Bld: 5 % (ref 4.8–5.6)

## 2017-02-14 ENCOUNTER — Other Ambulatory Visit: Payer: BLUE CROSS/BLUE SHIELD

## 2017-02-14 ENCOUNTER — Other Ambulatory Visit: Payer: Self-pay | Admitting: Certified Nurse Midwife

## 2017-02-14 DIAGNOSIS — O0992 Supervision of high risk pregnancy, unspecified, second trimester: Secondary | ICD-10-CM

## 2017-02-17 ENCOUNTER — Other Ambulatory Visit: Payer: Self-pay | Admitting: Certified Nurse Midwife

## 2017-02-17 DIAGNOSIS — B9689 Other specified bacterial agents as the cause of diseases classified elsewhere: Secondary | ICD-10-CM

## 2017-02-17 DIAGNOSIS — E559 Vitamin D deficiency, unspecified: Secondary | ICD-10-CM

## 2017-02-17 DIAGNOSIS — N760 Acute vaginitis: Principal | ICD-10-CM

## 2017-02-17 DIAGNOSIS — O0992 Supervision of high risk pregnancy, unspecified, second trimester: Secondary | ICD-10-CM

## 2017-02-17 LAB — CYSTIC FIBROSIS MUTATION 97: GENE DIS ANAL CARRIER INTERP BLD/T-IMP: NOT DETECTED

## 2017-02-17 MED ORDER — METRONIDAZOLE 500 MG PO TABS: 500.0000 mg | ORAL_TABLET | Freq: Two times a day (BID) | ORAL | 0 refills | Status: DC

## 2017-02-17 MED ORDER — VITAMIN D (ERGOCALCIFEROL) 1.25 MG (50000 UNIT) PO CAPS: 50000.0000 [IU] | ORAL_CAPSULE | ORAL | 2 refills | Status: DC

## 2017-02-19 ENCOUNTER — Other Ambulatory Visit: Payer: Self-pay | Admitting: Certified Nurse Midwife

## 2017-02-19 DIAGNOSIS — O0992 Supervision of high risk pregnancy, unspecified, second trimester: Secondary | ICD-10-CM

## 2017-02-21 ENCOUNTER — Encounter (HOSPITAL_COMMUNITY): Payer: Self-pay | Admitting: *Deleted

## 2017-02-21 ENCOUNTER — Inpatient Hospital Stay (HOSPITAL_COMMUNITY): Payer: BLUE CROSS/BLUE SHIELD

## 2017-02-21 ENCOUNTER — Encounter: Payer: Self-pay | Admitting: Obstetrics and Gynecology

## 2017-02-21 ENCOUNTER — Inpatient Hospital Stay (HOSPITAL_COMMUNITY)
Admission: AD | Admit: 2017-02-21 | Discharge: 2017-02-21 | Disposition: A | Discharge status: Home / Self Care | Payer: BLUE CROSS/BLUE SHIELD | Source: Ambulatory Visit | Attending: Obstetrics & Gynecology | Admitting: Obstetrics & Gynecology

## 2017-02-21 ENCOUNTER — Other Ambulatory Visit: Payer: Self-pay

## 2017-02-21 ENCOUNTER — Ambulatory Visit (INDEPENDENT_AMBULATORY_CARE_PROVIDER_SITE_OTHER): Payer: BLUE CROSS/BLUE SHIELD | Admitting: Obstetrics and Gynecology

## 2017-02-21 ENCOUNTER — Emergency Department (HOSPITAL_COMMUNITY)
Admission: EM | Admit: 2017-02-21 | Discharge: 2017-02-21 | Discharge status: Against Medical Advice | Payer: BLUE CROSS/BLUE SHIELD | Source: Home / Self Care

## 2017-02-21 ENCOUNTER — Encounter (HOSPITAL_COMMUNITY): Payer: Self-pay

## 2017-02-21 VITALS — BP 134/81 | HR 96 | Wt 211.0 lb

## 2017-02-21 DIAGNOSIS — R0602 Shortness of breath: Secondary | ICD-10-CM | POA: Insufficient documentation

## 2017-02-21 DIAGNOSIS — Z8709 Personal history of other diseases of the respiratory system: Secondary | ICD-10-CM

## 2017-02-21 DIAGNOSIS — M7918 Myalgia, other site: Secondary | ICD-10-CM | POA: Insufficient documentation

## 2017-02-21 DIAGNOSIS — Z98891 History of uterine scar from previous surgery: Secondary | ICD-10-CM

## 2017-02-21 DIAGNOSIS — Z86711 Personal history of pulmonary embolism: Secondary | ICD-10-CM

## 2017-02-21 DIAGNOSIS — O9989 Other specified diseases and conditions complicating pregnancy, childbirth and the puerperium: Secondary | ICD-10-CM

## 2017-02-21 DIAGNOSIS — O219 Vomiting of pregnancy, unspecified: Secondary | ICD-10-CM

## 2017-02-21 DIAGNOSIS — Z79899 Other long term (current) drug therapy: Secondary | ICD-10-CM | POA: Diagnosis not present

## 2017-02-21 DIAGNOSIS — Z8751 Personal history of pre-term labor: Secondary | ICD-10-CM

## 2017-02-21 DIAGNOSIS — Z7982 Long term (current) use of aspirin: Secondary | ICD-10-CM | POA: Insufficient documentation

## 2017-02-21 DIAGNOSIS — Z8759 Personal history of other complications of pregnancy, childbirth and the puerperium: Secondary | ICD-10-CM

## 2017-02-21 DIAGNOSIS — E0590 Thyrotoxicosis, unspecified without thyrotoxic crisis or storm: Secondary | ICD-10-CM

## 2017-02-21 DIAGNOSIS — O26892 Other specified pregnancy related conditions, second trimester: Secondary | ICD-10-CM

## 2017-02-21 DIAGNOSIS — O0990 Supervision of high risk pregnancy, unspecified, unspecified trimester: Secondary | ICD-10-CM

## 2017-02-21 DIAGNOSIS — Z3A22 22 weeks gestation of pregnancy: Secondary | ICD-10-CM | POA: Insufficient documentation

## 2017-02-21 DIAGNOSIS — R112 Nausea with vomiting, unspecified: Secondary | ICD-10-CM | POA: Diagnosis not present

## 2017-02-21 DIAGNOSIS — Z5321 Procedure and treatment not carried out due to patient leaving prior to being seen by health care provider: Secondary | ICD-10-CM | POA: Insufficient documentation

## 2017-02-21 DIAGNOSIS — R0789 Other chest pain: Secondary | ICD-10-CM | POA: Insufficient documentation

## 2017-02-21 LAB — BASIC METABOLIC PANEL
Anion gap: 8 (ref 5–15)
BUN: 7 mg/dL (ref 6–20)
CHLORIDE: 105 mmol/L (ref 101–111)
CO2: 21 mmol/L — AB (ref 22–32)
Calcium: 9.1 mg/dL (ref 8.9–10.3)
Creatinine, Ser: 0.53 mg/dL (ref 0.44–1.00)
GFR calc Af Amer: >60 mL/min (ref >60)
GFR calc non Af Amer: >60 mL/min (ref >60)
GLUCOSE: 80 mg/dL (ref 65–99)
POTASSIUM: 3.6 mmol/L (ref 3.5–5.1)
Sodium: 134 mmol/L — ABNORMAL LOW (ref 135–145)

## 2017-02-21 LAB — URINALYSIS, ROUTINE W REFLEX MICROSCOPIC
Bilirubin Urine: NEGATIVE
GLUCOSE, UA: NEGATIVE mg/dL
HGB URINE DIPSTICK: NEGATIVE
KETONES UR: NEGATIVE mg/dL
LEUKOCYTES UA: NEGATIVE
NITRITE: NEGATIVE
PH: 5 (ref 5.0–8.0)
PROTEIN: 30 mg/dL — AB
Specific Gravity, Urine: 1.03 (ref 1.005–1.030)

## 2017-02-21 LAB — I-STAT BETA HCG BLOOD, ED (MC, WL, AP ONLY): I-stat hCG, quantitative: >2000 m[IU]/mL — ABNORMAL HIGH (ref <5)

## 2017-02-21 LAB — CBC
HEMATOCRIT: 34.6 % — AB (ref 36.0–46.0)
Hemoglobin: 11.1 g/dL — ABNORMAL LOW (ref 12.0–15.0)
MCH: 27.4 pg (ref 26.0–34.0)
MCHC: 32.1 g/dL (ref 30.0–36.0)
MCV: 85.4 fL (ref 78.0–100.0)
Platelets: 373 10*3/uL (ref 150–400)
RBC: 4.05 MIL/uL (ref 3.87–5.11)
RDW: 15.2 % (ref 11.5–15.5)
WBC: 11.7 10*3/uL — ABNORMAL HIGH (ref 4.0–10.5)

## 2017-02-21 LAB — I-STAT TROPONIN, ED: Troponin i, poc: 0 ng/mL (ref 0.00–0.08)

## 2017-02-21 LAB — INFLUENZA PANEL BY PCR (TYPE A & B)
Influenza A By PCR: NEGATIVE
Influenza B By PCR: NEGATIVE

## 2017-02-21 MED ORDER — PRENATAL ADULT GUMMY/DHA/FA 0.4-25 MG PO CHEW: 1.0000 | CHEWABLE_TABLET | Freq: Every day | ORAL | 10 refills | Status: DC

## 2017-02-21 MED ORDER — IBUPROFEN 600 MG PO TABS
600.0000 mg | ORAL_TABLET | Freq: Once | ORAL | Status: AC
  Administered 2017-02-21: 600 mg via ORAL
  Filled 2017-02-21: qty 1

## 2017-02-21 MED ORDER — IBUPROFEN 600 MG PO TABS: 600.0000 mg | ORAL_TABLET | Freq: Four times a day (QID) | ORAL | 0 refills | Status: DC | PRN

## 2017-02-21 MED ORDER — ASPIRIN EC 81 MG PO TBEC: 81.0000 mg | DELAYED_RELEASE_TABLET | Freq: Every day | ORAL | 2 refills | Status: DC

## 2017-02-21 MED ORDER — METOCLOPRAMIDE HCL 10 MG PO TABS: 10.0000 mg | ORAL_TABLET | Freq: Three times a day (TID) | ORAL | 2 refills | Status: DC

## 2017-02-21 MED ORDER — FERROUS SULFATE 325 (65 FE) MG PO TABS: 325.0000 mg | ORAL_TABLET | Freq: Every day | ORAL | 10 refills | Status: DC

## 2017-02-21 MED ORDER — IOPAMIDOL (ISOVUE-370) INJECTION 76%
100.0000 mL | Freq: Once | INTRAVENOUS | Status: AC | PRN
  Administered 2017-02-21: 100 mL via INTRAVENOUS

## 2017-02-21 MED ORDER — IOPAMIDOL (ISOVUE-370) INJECTION 76%
75.0000 mL | Freq: Once | INTRAVENOUS | Status: AC | PRN
  Administered 2017-02-21: 75 mL via INTRAVENOUS

## 2017-02-21 NOTE — Progress Notes (Signed)
AFP done on 02/09/2017 : WNL   Chest pain , SOB pt went to hospital last night and left w/o being seen after 4 hrs of waiting.   Pt states she just started Lovenox 2 days ago. Was unable to pick up Rx due to cost.    Due to Hx of PE pt is concerned.   Pt states she is having to she inhaler every morning.   Pt not feeling well at all today.   17P?

## 2017-02-21 NOTE — Progress Notes (Signed)
O2 Sat: 98%

## 2017-02-21 NOTE — MAU Provider Note (Addendum)
Chief Complaint: Shortness of Breath   None     SUBJECTIVE HPI: Debra Horn is a 32 y.o. G3P0101 at [redacted]w[redacted]d by LMP who presents to maternity admissions sent from the office for chest pain and shortness of breath.  She reports having aching pain in both lower legs 2 days ago, then her leg pan resolved and she started having pain in her right mid chest that radiates to her upper right back. The pain is constant, sharp pain that is unchanged by position change, eating or drinking, or rest.  Her PMhx is significant for PE in 2013 after starting OCPs 1 day prior so considered unknown cause and not directly related to OCPs. She feels like her symptoms are similar now to the PE.  She reports some intermittent chills in the last 2-3 days but denies any cough, rhinorrhea, congestion, or fever. She denies sick contacts. .  She was on Lovenox during her previous pregnancy and started Lovenox in this pregnancy  2 days ago. She presented to [facility] early this morning and waited x 4 hours in the waiting room but left and came to her appointment at [facility] today.  Pt has had multiple CT scans since her diagnosis in 2013 and admits to anxiety about chest pain/SOB symptoms for fear she has another blood clot.  She is currently taking Lovenox 30 mg injection BID for prophylaxis. She reports that after her PE in 2013, she saw hematology and was on Xarelto and did IV iron treatments but became pregnant so did not continue with hematology.  She was not on anticoagulation prior to this pregnancy. She reports nausea/vomiting in pregnancy that is improving recently but she is still not eating regularly because of her symptoms. She has daily fatigue, sleeping several hours per day that is not getting better in the second trimester.    There are no other associated symptoms. She has not tried any treatments for her symptoms.    HPI  Past Medical History:  Diagnosis Date  . Anemia    low iron  . Anxiety   . Asthma   .  Complication of anesthesia    bp dropped in middle of last c-section and started with n/v  . GERD (gastroesophageal reflux disease)    during pregnancy  . Headache    "couple days/week" (10/28/2015)  . Hyperthyroidism    not on medication  . PE (pulmonary embolism) 11/2011  . Pregnancy induced hypertension   . Preterm labor    Past Surgical History:  Procedure Laterality Date  . CESAREAN SECTION  2008; 2014  . FRACTURE SURGERY    . ORIF ANKLE FRACTURE Right 10/28/2015  . ORIF ANKLE FRACTURE Right 10/28/2015   Procedure: OPEN REDUCTION INTERNAL FIXATION (ORIF) RIGHT ANKLE FRACTURE;  Surgeon: Yolonda Kida, MD;  Location: [facility] OR;  Service: Orthopedics;  Laterality: Right;   Social History   Socioeconomic History  . Marital status: Married    Spouse name: Not on file  . Number of children: 2  . Years of education: 70  . Highest education level: Not on file  Social Needs  . Financial resource strain: Not on file  . Food insecurity - worry: Not on file  . Food insecurity - inability: Not on file  . Transportation needs - medical: Not on file  . Transportation needs - non-medical: Not on file  Occupational History  . Occupation: Clinical biochemist  Tobacco Use  . Smoking status: Never Smoker  . Smokeless tobacco: Never Used  Substance and Sexual Activity  . Alcohol use: No  . Drug use: No  . Sexual activity: Yes    Birth control/protection: None  Other Topics Concern  . Not on file  Social History Narrative   [place]. Currently resides in an apartment with her husband and 2 children. No pets. Fun: Work, sleep   Denies religious beliefs that would effect health care.    No current facility-administered medications on file prior to encounter.    Current Outpatient Medications on File Prior to Encounter  Medication Sig Dispense Refill  . albuterol (PROVENTIL HFA;VENTOLIN HFA) 108 (90 Base) MCG/ACT inhaler Inhale 2 puffs  into the lungs every 6 (six) hours as needed for wheezing or shortness of breath. 18 g 1  . enoxaparin (LOVENOX) 30 MG/0.3ML injection Inject 0.3 mLs (30 mg total) into the skin every 12 (twelve) hours. 30 Syringe 12  . aspirin EC 81 MG tablet Take 1 tablet (81 mg total) by mouth daily. Take after 12 weeks for prevention of preeclampsia later in pregnancy (Patient not taking: Reported on 02/21/2017) 300 tablet 2  . glycopyrrolate (ROBINUL) 1 MG tablet Take 1 tablet (1 mg total) by mouth 3 (three) times daily. (Patient not taking: Reported on 02/21/2017) 30 tablet 2  . Prenatal-Fe Fum-Methf-FA w/o A (VITAFOL-NANO) 18-0.6-0.4 MG TABS Take 1 tablet by mouth daily. (Patient not taking: Reported on 02/21/2017) 30 tablet 12  . Vitamin D, Ergocalciferol, (DRISDOL) 50000 units CAPS capsule Take 1 capsule (50,000 Units total) by mouth every 7 (seven) days. (Patient not taking: Reported on 02/21/2017) 30 capsule 2   Allergies  Allergen Reactions  . Banana Anaphylaxis  . Cefixime Rash    ROS:  Review of Systems  Constitutional: Positive for chills. Negative for fatigue and fever.  HENT: Negative for congestion, rhinorrhea and sore throat.   Respiratory: Positive for shortness of breath. Negative for cough.   Cardiovascular: Positive for chest pain.  Gastrointestinal: Negative for abdominal pain, nausea and vomiting.  Genitourinary: Negative for difficulty urinating, dysuria, flank pain, pelvic pain, vaginal bleeding, vaginal discharge and vaginal pain.  Musculoskeletal: Positive for back pain.  Neurological: Negative for dizziness and headaches.  Psychiatric/Behavioral: Negative.      I have reviewed patient's Past Medical Hx, Surgical Hx, Family Hx, Social Hx, medications and allergies.   Physical Exam   Patient Vitals for the past 24 hrs:  BP Temp Temp src Pulse Resp SpO2 Weight  02/21/17 1230 - - - - - 97 % -  02/21/17 1217 - - - - - 100 % -  02/21/17 1212 - - - - - 98 % -  02/21/17 1158  128/79 98.1 F (36.7 C) Oral 87 16 98 % 209 lb (94.8 kg)  02/21/17 1156 - - - - - 100 % -   Constitutional: Well-developed, well-nourished female in mild distress.  HEART: normal rate, heart sounds, regular rhythm RESP: normal effort, lung sounds clear and equal bilaterally GI: Abd soft, non-tender. Pos BS x 4 MS: Extremities nontender, chest pain not reproducible to palpation, no edema, normal ROM Neurologic: Alert and oriented x 4.  GU: Neg CVAT.   FHT 155 by doppler  LAB RESULTS Results for orders placed or performed during the hospital encounter of 02/21/17 (from the past 24 hour(s))  Urinalysis, Routine w reflex microscopic     Status: Abnormal   Collection Time: 02/21/17 12:03 PM  Result Value Ref Range   Color, Urine AMBER (A) YELLOW  APPearance HAZY (A) CLEAR   Specific Gravity, Urine 1.030 1.005 - 1.030   pH 5.0 5.0 - 8.0   Glucose, UA NEGATIVE NEGATIVE mg/dL   Hgb urine dipstick NEGATIVE NEGATIVE   Bilirubin Urine NEGATIVE NEGATIVE   Ketones, ur NEGATIVE NEGATIVE mg/dL   Protein, ur 30 (A) NEGATIVE mg/dL   Nitrite NEGATIVE NEGATIVE   Leukocytes, UA NEGATIVE NEGATIVE   RBC / HPF 0-5 0 - 5 RBC/hpf   WBC, UA 0-5 0 - 5 WBC/hpf   Bacteria, UA RARE (A) NONE SEEN   Squamous Epithelial / LPF 6-30 (A) NONE SEEN   Mucus PRESENT    Ca Oxalate Crys, UA PRESENT   Influenza panel by PCR (type A & B)     Status: None   Collection Time: 02/21/17 12:10 PM  Result Value Ref Range   Influenza A By PCR NEGATIVE NEGATIVE   Influenza B By PCR NEGATIVE NEGATIVE   EKG: normal EKG, normal sinus rhythm.  B/Positive/-- (02/08 1233)  IMAGING Ct Angio Chest Pe W Or Wo Contrast  Result Date: 02/21/2017 CLINICAL DATA:  32 year old female [redacted] weeks pregnant with shortness of breath and right upper chest and back pain for the past 2 days. History of asthma. EXAM: CT ANGIOGRAPHY CHEST WITH CONTRAST TECHNIQUE: Multidetector CT imaging of the chest was performed using the standard protocol  during bolus administration of intravenous contrast. Multiplanar CT image reconstructions and MIPs were obtained to evaluate the vascular anatomy. CONTRAST:  ISOVUE-370 IOPAMIDOL (ISOVUE-370) INJECTION 76%, 75mL ISOVUE-370 IOPAMIDOL (ISOVUE-370) INJECTION 76% COMPARISON:  Chest CT 02/16/2016. FINDINGS: Cardiovascular: There are no filling defects within the pulmonary arterial tree to suggest underlying pulmonary embolism. Heart size is normal. There is no significant pericardial fluid, thickening or pericardial calcification. No atherosclerotic calcifications in the thoracic aorta or great vessels of the mediastinum. No definite coronary artery calcifications. Mediastinum/Nodes: Small amount of amorphous soft tissue in the anterior mediastinum has an appearance most compatible with residual thymic tissue. No pathologically enlarged mediastinal or hilar lymph nodes. Esophagus is unremarkable in appearance. No axillary lymphadenopathy. Lungs/Pleura: No suspicious appearing pulmonary nodules or masses. No acute consolidative airspace disease. No pleural effusions. Upper Abdomen: Unremarkable. Musculoskeletal: There are no aggressive appearing lytic or blastic lesions noted in the visualized portions of the skeleton. Review of the MIP images confirms the above findings. IMPRESSION: 1. No acute findings are noted in the thorax to account for the patient's symptoms. Specifically, no evidence of pulmonary embolism. Electronically Signed   By: Trudie Reed M.D.   On: 02/21/2017 14:43    MAU Management/MDM: Pt is well appearing but has constant chest pain that radiates to her back, and reports leg pain 2 days prior to onset of chest pain. Given pt hx of PE in 2013, ordered CT angio which was negative, ruling out PE or pneumonia as cause for pain. Pt reports SOB but lung sounds clear and O2 sats remain 100% on RA while in MAU. EKG wnl.  Although pain not reproducible, it does radiate along ribs under right arm  into upper back, c/w musculoskeletal pain. Ibuprofen 600 mg PO x 1 dose in MAU.  Pt with improved pain. SOB likely physiologic related to pregnancy.   Consult Dr Macon Large with assessment and findings.  Pt to continue prophylaxis with Lovenox 30 mg BID, Rx for ibuprofen 600 mg Q 6 hours PRN x 12 tablets, use for short course during second trimester of pregnancy only, and Rx for Reglan 10 mg TID with meals to  improve pt ability to eat.   Pt is not taking PNV because of cost and size of pills. Rx for gummy PNV and add ferrous sulfate tablet daily.  Pt to f/u at [facility] in 2 weeks as scheduled. Return to MAU as needed for emergencies.  Pt discharged with strict precautions.  ASSESSMENT 1. Musculoskeletal pain   2. Shortness of breath due to pregnancy in second trimester   3. Hx pulmonary embolism   4. Nausea and vomiting during pregnancy prior to [redacted] weeks gestation     PLAN Discharge home Allergies as of 02/21/2017      Reactions   Banana Anaphylaxis   Cefixime Rash      Medication List    STOP taking these medications   VITAFOL-NANO 18-0.6-0.4 MG Tabs     TAKE these medications   albuterol 108 (90 Base) MCG/ACT inhaler Commonly known as:  PROVENTIL HFA;VENTOLIN HFA Inhale 2 puffs into the lungs every 6 (six) hours as needed for wheezing or shortness of breath.   aspirin EC 81 MG tablet Take 1 tablet (81 mg total) by mouth daily. Take after 12 weeks for prevention of preeclampsia later in pregnancy   enoxaparin 30 MG/0.3ML injection Commonly known as:  LOVENOX Inject 0.3 mLs (30 mg total) into the skin every 12 (twelve) hours.   ferrous sulfate 325 (65 FE) MG tablet Commonly known as:  FERROUSUL Take 1 tablet (325 mg total) by mouth daily with breakfast.   glycopyrrolate 1 MG tablet Commonly known as:  ROBINUL Take 1 tablet (1 mg total) by mouth 3 (three) times daily.   ibuprofen 600 MG tablet Commonly known as:  ADVIL,MOTRIN Take 1 tablet (600 mg total) by mouth every 6 (six)  hours as needed.   metoCLOPramide 10 MG tablet Commonly known as:  REGLAN Take 1 tablet (10 mg total) by mouth 3 (three) times daily before meals.   Prenatal Adult Gummy/DHA/FA 0.4-25 MG Chew Chew 1 tablet by mouth daily.   Vitamin D (Ergocalciferol) 50000 units Caps capsule Commonly known as:  DRISDOL Take 1 capsule (50,000 Units total) by mouth every 7 (seven) days.      Follow-up Information    [facility] [place] Follow up.   Why:  Return to MAU if symptoms persist or worsen. Keep scheduled appointments at [facility] and for your ultrasounds. Contact information: [address] [place] [phone]          Sharen Counter Certified Nurse-Midwife 02/21/2017  4:43 PM

## 2017-02-21 NOTE — Progress Notes (Addendum)
Subjective:  Debra Horn is a 32 y.o. G3P0101 at [redacted]w[redacted]d being seen today for ongoing prenatal care.  She is currently monitored for the following issues for this high-risk pregnancy and has Hx pulmonary embolism; Supervision of high-risk pregnancy; Previous cesarean section; Family history of systemic lupus erythematosus (SLE) in mother; Hyperthyroidism; Normocytic anemia; SIRS (systemic inflammatory response syndrome) (HCC); History of asthma; History of pregnancy induced hypertension; History of preterm delivery; and Vitamin D deficiency on their problem list.  Patient reports CP and SOB x 2 days. Subjective chills and body aches a few days ago as well. H/O PE just strated Lovenox 2 days ago..  Contractions: Not present. Vag. Bleeding: None.  Movement: Absent. Denies leaking of fluid.   The following portions of the patient's history were reviewed and updated as appropriate: allergies, current medications, past family history, past medical history, past social history, past surgical history and problem list. Problem list updated.  Objective:   Vitals:   02/21/17 1020  BP: 134/81  Pulse: 96  Weight: 211 lb (95.7 kg)    Fetal Status: Fetal Heart Rate (bpm): 156   Movement: Absent     General:  Alert, oriented and cooperative. Patient is in no acute distress.  Skin: Skin is warm and dry. No rash noted.   Cardiovascular: Normal heart rate noted  Respiratory: Normal respiratory effort, no problems with respiration noted, O2 sat 98 % on RA  Abdomen: Soft, gravid, appropriate for gestational age. Pain/Pressure: Absent     Pelvic:  Cervical exam deferred        Extremities: Normal range of motion.  Edema: None  Mental Status: Normal mood and affect. Normal behavior. Normal judgment and thought content.   Urinalysis:      Assessment and Plan:  Pregnancy: G3P0101 at [redacted]w[redacted]d  1. Supervision of high risk pregnancy, antepartum With above Sx and H/O will send to MAU for further eval  2.  Previous cesarean section For repeat  3. Hyperthyroidism TSH normal  4. Hx pulmonary embolism Continue with Lovenox MFM consult  5. History of preterm delivery PTD d/t SPEC not a candidate for 17 OHP  6. History of pregnancy induced hypertension Will start BASA  7. History of asthma Continue with Albuterol Await referral to Pulmonary   D/T to pt's Sx and H/O. Will sent to MAU for further eval. MAU contacted and made aware of pt.   Preterm labor symptoms and general obstetric precautions including but not limited to vaginal bleeding, contractions, leaking of fluid and fetal movement were reviewed in detail with the patient. Please refer to After Visit Summary for other counseling recommendations.  Return in about 2 weeks (around 03/07/2017).   Hermina StaggersErvin, Bassheva Flury L, MD

## 2017-02-21 NOTE — MAU Note (Signed)
Was just at the dr's- sent over here for further eval.  Was having chest pain, [facility] was there 5 hrs, did not get seen.  Has eased up now.

## 2017-02-21 NOTE — ED Triage Notes (Signed)
Pt states she has had chest pain for about 2 days with body pain. She is [redacted] weeks pregnant with history of PE's , started back on Lovenox yesterday. She is concerned it may be related to her PE history

## 2017-03-06 ENCOUNTER — Ambulatory Visit (HOSPITAL_COMMUNITY)
Admission: RE | Admit: 2017-03-06 | Discharge: 2017-03-06 | Disposition: A | Discharge status: Home / Self Care | Payer: Medicaid Other | Source: Ambulatory Visit | Attending: Certified Nurse Midwife | Admitting: Certified Nurse Midwife

## 2017-03-06 ENCOUNTER — Encounter: Payer: Self-pay | Admitting: Obstetrics & Gynecology

## 2017-03-06 ENCOUNTER — Encounter (HOSPITAL_COMMUNITY): Payer: Self-pay

## 2017-03-06 ENCOUNTER — Ambulatory Visit (HOSPITAL_COMMUNITY): Payer: Medicaid Other

## 2017-03-06 ENCOUNTER — Ambulatory Visit (INDEPENDENT_AMBULATORY_CARE_PROVIDER_SITE_OTHER): Payer: BLUE CROSS/BLUE SHIELD | Admitting: Obstetrics & Gynecology

## 2017-03-06 ENCOUNTER — Other Ambulatory Visit: Payer: Self-pay | Admitting: Certified Nurse Midwife

## 2017-03-06 ENCOUNTER — Other Ambulatory Visit (HOSPITAL_COMMUNITY): Payer: Self-pay | Admitting: *Deleted

## 2017-03-06 DIAGNOSIS — O99212 Obesity complicating pregnancy, second trimester: Secondary | ICD-10-CM | POA: Diagnosis not present

## 2017-03-06 DIAGNOSIS — Z86711 Personal history of pulmonary embolism: Secondary | ICD-10-CM

## 2017-03-06 DIAGNOSIS — Z8759 Personal history of other complications of pregnancy, childbirth and the puerperium: Secondary | ICD-10-CM | POA: Insufficient documentation

## 2017-03-06 DIAGNOSIS — O0993 Supervision of high risk pregnancy, unspecified, third trimester: Secondary | ICD-10-CM

## 2017-03-06 DIAGNOSIS — O0992 Supervision of high risk pregnancy, unspecified, second trimester: Secondary | ICD-10-CM | POA: Diagnosis present

## 2017-03-06 DIAGNOSIS — O99282 Endocrine, nutritional and metabolic diseases complicating pregnancy, second trimester: Secondary | ICD-10-CM | POA: Insufficient documentation

## 2017-03-06 DIAGNOSIS — O0990 Supervision of high risk pregnancy, unspecified, unspecified trimester: Secondary | ICD-10-CM

## 2017-03-06 DIAGNOSIS — E0590 Thyrotoxicosis, unspecified without thyrotoxic crisis or storm: Secondary | ICD-10-CM | POA: Diagnosis not present

## 2017-03-06 DIAGNOSIS — Z8709 Personal history of other diseases of the respiratory system: Secondary | ICD-10-CM

## 2017-03-06 DIAGNOSIS — Z3A18 18 weeks gestation of pregnancy: Secondary | ICD-10-CM | POA: Insufficient documentation

## 2017-03-06 DIAGNOSIS — Z98891 History of uterine scar from previous surgery: Secondary | ICD-10-CM

## 2017-03-06 DIAGNOSIS — Z3689 Encounter for other specified antenatal screening: Secondary | ICD-10-CM | POA: Diagnosis not present

## 2017-03-06 DIAGNOSIS — Z363 Encounter for antenatal screening for malformations: Secondary | ICD-10-CM

## 2017-03-06 DIAGNOSIS — O09299 Supervision of pregnancy with other poor reproductive or obstetric history, unspecified trimester: Secondary | ICD-10-CM

## 2017-03-06 NOTE — Progress Notes (Signed)
Patient reports feeling fetal movement, denies pain. 

## 2017-03-06 NOTE — Patient Instructions (Signed)
Second Trimester of Pregnancy The second trimester is from week 13 through week 28, month 4 through 6. This is often the time in pregnancy that you feel your best. Often times, morning sickness has lessened or quit. You may have more energy, and you may get hungry more often. Your unborn baby (fetus) is growing rapidly. At the end of the sixth month, he or she is about 9 inches long and weighs about 1 pounds. You will likely feel the baby move (quickening) between 18 and 20 weeks of pregnancy. Follow these instructions at home:  Avoid all smoking, herbs, and alcohol. Avoid drugs not approved by your doctor.  Do not use any tobacco products, including cigarettes, chewing tobacco, and electronic cigarettes. If you need help quitting, ask your doctor. You may get counseling or other support to help you quit.  Only take medicine as told by your doctor. Some medicines are safe and some are not during pregnancy.  Exercise only as told by your doctor. Stop exercising if you start having cramps.  Eat regular, healthy meals.  Wear a good support bra if your breasts are tender.  Do not use hot tubs, steam rooms, or saunas.  Wear your seat belt when driving.  Avoid raw meat, uncooked cheese, and liter boxes and soil used by cats.  Take your prenatal vitamins.  Take 1500-2000 milligrams of calcium daily starting at the 20th week of pregnancy until you deliver your baby.  Try taking medicine that helps you poop (stool softener) as needed, and if your doctor approves. Eat more fiber by eating fresh fruit, vegetables, and whole grains. Drink enough fluids to keep your pee (urine) clear or pale yellow.  Take warm water baths (sitz baths) to soothe pain or discomfort caused by hemorrhoids. Use hemorrhoid cream if your doctor approves.  If you have puffy, bulging veins (varicose veins), wear support hose. Raise (elevate) your feet for 15 minutes, 3-4 times a day. Limit salt in your diet.  Avoid heavy  lifting, wear low heals, and sit up straight.  Rest with your legs raised if you have leg cramps or low back pain.  Visit your dentist if you have not gone during your pregnancy. Use a soft toothbrush to brush your teeth. Be gentle when you floss.  You can have sex (intercourse) unless your doctor tells you not to.  Go to your doctor visits. Get help if:  You feel dizzy.  You have mild cramps or pressure in your lower belly (abdomen).  You have a nagging pain in your belly area.  You continue to feel sick to your stomach (nauseous), throw up (vomit), or have watery poop (diarrhea).  You have bad smelling fluid coming from your vagina.  You have pain with peeing (urination). Get help right away if:  You have a fever.  You are leaking fluid from your vagina.  You have spotting or bleeding from your vagina.  You have severe belly cramping or pain.  You lose or gain weight rapidly.  You have trouble catching your breath and have chest pain.  You notice sudden or extreme puffiness (swelling) of your face, hands, ankles, feet, or legs.  You have not felt the baby move in over an hour.  You have severe headaches that do not go away with medicine.  You have vision changes. This information is not intended to replace advice given to you by your health care provider. Make sure you discuss any questions you have with your health care   provider. Document Released: 03/15/2009 Document Revised: 05/27/2015 Document Reviewed: 02/20/2012 Elsevier Interactive Patient Education  2017 Elsevier Inc.  

## 2017-03-06 NOTE — Progress Notes (Signed)
   PRENATAL VISIT NOTE  Subjective:  Debra Horn is a 32 y.o. [id number] at 6853w4d being seen today for ongoing prenatal care.  She is currently monitored for the following issues for this high-risk pregnancy and has Hx pulmonary embolism; Supervision of high-risk pregnancy; Previous cesarean section; Family history of systemic lupus erythematosus (SLE) in mother; Hyperthyroidism; Normocytic anemia; SIRS (systemic inflammatory response syndrome) (HCC); History of asthma; History of pregnancy induced hypertension; History of preterm delivery; and Vitamin D deficiency on their problem list.  Patient reports no complaints.  Contractions: Not present. Vag. Bleeding: None.  Movement: Present. Denies leaking of fluid.   The following portions of the patient's history were reviewed and updated as appropriate: allergies, current medications, past family history, past medical history, past social history, past surgical history and problem list. Problem list updated.  Objective:   Vitals:   03/06/17 1003  BP: 120/83  Pulse: 88  Weight: 206 lb 12.8 oz (93.8 kg)    Fetal Status: Fetal Heart Rate (bpm): 147 Fundal Height: 19 cm Movement: Present     General:  Alert, oriented and cooperative. Patient is in no acute distress.  Skin: Skin is warm and dry. No rash noted.   Cardiovascular: Normal heart rate noted  Respiratory: Normal respiratory effort, no problems with respiration noted  Abdomen: Soft, gravid, appropriate for gestational age.  Pain/Pressure: Absent     Pelvic: Cervical exam deferred        Extremities: Normal range of motion.  Edema: None  Mental Status:  Normal mood and affect. Normal behavior. Normal judgment and thought content.   Assessment and Plan:  Pregnancy: [id number] at 3253w4d  1. Supervision of high risk pregnancy, antepartum   2. Hx pulmonary embolism Had negative CT angio chest  Preterm labor symptoms and general obstetric precautions including but not limited to  vaginal bleeding, contractions, leaking of fluid and fetal movement were reviewed in detail with the patient. Please refer to After Visit Summary for other counseling recommendations.  Return in about 4 weeks (around 04/03/2017).   Scheryl DarterJames Arslan Kier, MD

## 2017-03-07 ENCOUNTER — Other Ambulatory Visit: Payer: Self-pay | Admitting: Certified Nurse Midwife

## 2017-03-07 DIAGNOSIS — O0992 Supervision of high risk pregnancy, unspecified, second trimester: Secondary | ICD-10-CM

## 2017-03-27 ENCOUNTER — Encounter (HOSPITAL_COMMUNITY): Payer: Self-pay

## 2017-03-27 ENCOUNTER — Ambulatory Visit (HOSPITAL_COMMUNITY)
Admission: RE | Admit: 2017-03-27 | Discharge: 2017-03-27 | Disposition: A | Discharge status: Home / Self Care | Payer: Medicaid Other | Source: Ambulatory Visit | Attending: Certified Nurse Midwife | Admitting: Certified Nurse Midwife

## 2017-03-27 ENCOUNTER — Ambulatory Visit (HOSPITAL_COMMUNITY): Payer: Medicaid Other

## 2017-04-03 ENCOUNTER — Ambulatory Visit (INDEPENDENT_AMBULATORY_CARE_PROVIDER_SITE_OTHER): Payer: Medicaid Other | Admitting: Obstetrics and Gynecology

## 2017-04-03 ENCOUNTER — Encounter: Payer: Self-pay | Admitting: Obstetrics and Gynecology

## 2017-04-03 VITALS — BP 131/84 | HR 84 | Wt 211.0 lb

## 2017-04-03 DIAGNOSIS — O09299 Supervision of pregnancy with other poor reproductive or obstetric history, unspecified trimester: Secondary | ICD-10-CM

## 2017-04-03 DIAGNOSIS — Z86711 Personal history of pulmonary embolism: Secondary | ICD-10-CM

## 2017-04-03 DIAGNOSIS — O0992 Supervision of high risk pregnancy, unspecified, second trimester: Secondary | ICD-10-CM

## 2017-04-03 DIAGNOSIS — Z8751 Personal history of pre-term labor: Secondary | ICD-10-CM

## 2017-04-03 DIAGNOSIS — Z98891 History of uterine scar from previous surgery: Secondary | ICD-10-CM

## 2017-04-03 MED ORDER — FLUCONAZOLE 150 MG PO TABS: 150.0000 mg | ORAL_TABLET | Freq: Once | ORAL | 0 refills | Status: AC

## 2017-04-03 NOTE — Progress Notes (Signed)
   PRENATAL VISIT NOTE  Subjective:  Debra Horn is a 32 y.o. [id number] at 267w4d being seen today for ongoing prenatal care.  She is currently monitored for the following issues for this high-risk pregnancy and has Hx pulmonary embolism; Supervision of high-risk pregnancy; Previous cesarean section; Family history of systemic lupus erythematosus (SLE) in mother; Hyperthyroidism; Normocytic anemia; SIRS (systemic inflammatory response syndrome) (HCC); History of asthma; Hx of preeclampsia, prior pregnancy, currently pregnant; History of preterm delivery; Vitamin D deficiency; IUGR (intrauterine growth restriction) in prior pregnancy, pregnant; History of pregnancy induced hypertension; and [redacted] weeks gestation of pregnancy on their problem list.  Patient reports curd-like pruritic white discharge.  Contractions: Not present. Vag. Bleeding: None.  Movement: Present. Denies leaking of fluid.   The following portions of the patient's history were reviewed and updated as appropriate: allergies, current medications, past family history, past medical history, past social history, past surgical history and problem list. Problem list updated.  Objective:   Vitals:   04/03/17 0940  BP: 131/84  Pulse: 84  Weight: 211 lb (95.7 kg)    Fetal Status: Fetal Heart Rate (bpm): 158 Fundal Height: 23 cm Movement: Present     General:  Alert, oriented and cooperative. Patient is in no acute distress.  Skin: Skin is warm and dry. No rash noted.   Cardiovascular: Normal heart rate noted  Respiratory: Normal respiratory effort, no problems with respiration noted  Abdomen: Soft, gravid, appropriate for gestational age.  Pain/Pressure: Absent     Pelvic: Cervical exam deferred        Extremities: Normal range of motion.  Edema: None  Mental Status: Normal mood and affect. Normal behavior. Normal judgment and thought content.   Assessment and Plan:  Pregnancy: [id number] at 607w4d  1. Supervision of high risk  pregnancy in second trimester Patient is doing well Patient declined self swab and pelvic exam Rx provided but patient informed that if symptoms persists, will need to collect vaginal swab Follow up anatomy ultrasound with MFM consult on 4/16  2. Hx pulmonary embolism Continue lovenox  3. History of preterm delivery Due to IUGR and preeclampsia Continue ASA  4. Previous cesarean section Will be scheduled for repeat  5. Hx of preeclampsia, prior pregnancy, currently pregnant Continue ASA  Preterm labor symptoms and general obstetric precautions including but not limited to vaginal bleeding, contractions, leaking of fluid and fetal movement were reviewed in detail with the patient. Please refer to After Visit Summary for other counseling recommendations.  Return in about 1 month (around 05/01/2017) for ROB, 2 hr glucola next visit.  Future Appointments  Date Time Provider Department Center  04/17/2017  8:45 AM [facility] US 2 [facility]  04/17/2017 10:00 AM [facility] MD RM [facility]    Catalina AntiguaPeggy Easter [place], MD

## 2017-04-17 ENCOUNTER — Ambulatory Visit (HOSPITAL_COMMUNITY)
Admission: RE | Admit: 2017-04-17 | Discharge: 2017-04-17 | Disposition: A | Discharge status: Home / Self Care | Payer: Medicaid Other | Source: Ambulatory Visit | Attending: Certified Nurse Midwife | Admitting: Certified Nurse Midwife

## 2017-04-17 ENCOUNTER — Encounter (HOSPITAL_COMMUNITY): Payer: Self-pay

## 2017-04-17 ENCOUNTER — Other Ambulatory Visit (HOSPITAL_COMMUNITY): Payer: Self-pay | Admitting: Obstetrics and Gynecology

## 2017-04-17 ENCOUNTER — Other Ambulatory Visit (HOSPITAL_COMMUNITY): Payer: Self-pay | Admitting: *Deleted

## 2017-04-17 DIAGNOSIS — K0889 Other specified disorders of teeth and supporting structures: Secondary | ICD-10-CM | POA: Insufficient documentation

## 2017-04-17 DIAGNOSIS — O09892 Supervision of other high risk pregnancies, second trimester: Secondary | ICD-10-CM

## 2017-04-17 DIAGNOSIS — Z8249 Family history of ischemic heart disease and other diseases of the circulatory system: Secondary | ICD-10-CM | POA: Insufficient documentation

## 2017-04-17 DIAGNOSIS — Z86711 Personal history of pulmonary embolism: Secondary | ICD-10-CM | POA: Insufficient documentation

## 2017-04-17 DIAGNOSIS — Z7901 Long term (current) use of anticoagulants: Secondary | ICD-10-CM | POA: Insufficient documentation

## 2017-04-17 DIAGNOSIS — Z8269 Family history of other diseases of the musculoskeletal system and connective tissue: Secondary | ICD-10-CM | POA: Diagnosis not present

## 2017-04-17 DIAGNOSIS — Z363 Encounter for antenatal screening for malformations: Secondary | ICD-10-CM

## 2017-04-17 DIAGNOSIS — E0590 Thyrotoxicosis, unspecified without thyrotoxic crisis or storm: Secondary | ICD-10-CM | POA: Insufficient documentation

## 2017-04-17 DIAGNOSIS — Z3A24 24 weeks gestation of pregnancy: Secondary | ICD-10-CM

## 2017-04-17 DIAGNOSIS — O09212 Supervision of pregnancy with history of pre-term labor, second trimester: Secondary | ICD-10-CM | POA: Diagnosis not present

## 2017-04-17 DIAGNOSIS — Z362 Encounter for other antenatal screening follow-up: Secondary | ICD-10-CM

## 2017-04-17 DIAGNOSIS — E669 Obesity, unspecified: Secondary | ICD-10-CM | POA: Diagnosis not present

## 2017-04-17 DIAGNOSIS — O99512 Diseases of the respiratory system complicating pregnancy, second trimester: Secondary | ICD-10-CM | POA: Insufficient documentation

## 2017-04-17 DIAGNOSIS — Z8759 Personal history of other complications of pregnancy, childbirth and the puerperium: Principal | ICD-10-CM

## 2017-04-17 DIAGNOSIS — Z86718 Personal history of other venous thrombosis and embolism: Secondary | ICD-10-CM | POA: Insufficient documentation

## 2017-04-17 DIAGNOSIS — O99282 Endocrine, nutritional and metabolic diseases complicating pregnancy, second trimester: Secondary | ICD-10-CM | POA: Insufficient documentation

## 2017-04-17 DIAGNOSIS — O34219 Maternal care for unspecified type scar from previous cesarean delivery: Secondary | ICD-10-CM | POA: Insufficient documentation

## 2017-04-17 DIAGNOSIS — J45909 Unspecified asthma, uncomplicated: Secondary | ICD-10-CM | POA: Diagnosis not present

## 2017-04-17 DIAGNOSIS — O99212 Obesity complicating pregnancy, second trimester: Secondary | ICD-10-CM | POA: Insufficient documentation

## 2017-04-17 DIAGNOSIS — O99612 Diseases of the digestive system complicating pregnancy, second trimester: Secondary | ICD-10-CM | POA: Insufficient documentation

## 2017-04-17 DIAGNOSIS — O09292 Supervision of pregnancy with other poor reproductive or obstetric history, second trimester: Secondary | ICD-10-CM | POA: Insufficient documentation

## 2017-04-17 DIAGNOSIS — J4530 Mild persistent asthma, uncomplicated: Secondary | ICD-10-CM | POA: Insufficient documentation

## 2017-04-17 DIAGNOSIS — Z87898 Personal history of other specified conditions: Secondary | ICD-10-CM | POA: Diagnosis not present

## 2017-04-17 DIAGNOSIS — O88219 Thromboembolism in pregnancy, unspecified trimester: Secondary | ICD-10-CM

## 2017-04-17 NOTE — ED Notes (Signed)
Pt reports dizziness x3 days.

## 2017-04-17 NOTE — Consult Note (Signed)
MATERNAL FETAL MEDICINE CONSULT  Patient Name: Debra Horn Medical Record Number:  [id number] Date of Birth: 1985/09/15 Requesting Physician Name:  Roe Coombs, CNM Date of Service: 04/17/2017  Chief Complaint Preeclampsia in 2 prior pregnancies  History of Present Illness Debra Horn  is a 32 y.o. [id number],ON [redacted]w[redacted]d with an EDD of 08/03/2017, that I was asked to see by Orvilla Cornwall, CNM for a history of preeclampsia in 2 prior pregnancies which ended in iatrogenic preterm deliveries of growth restricted fetuses via LTCS at 81 and 34 weeks, a prior DVT/PE, asthma, and hyperthyroidism.  She feels well today.  She has no problems with headache, visual changes, RUQ pain, vaginal bleeding, loss of fluid or contractions.  She reports she is using her albuterol inhaler nearly everyday and also frequently at night.  Ms. Stoltzfus also reports she has tooth pain as she "cracked" one of her wisdom teeth.  Review of Systems Pertinent items are noted in HPI.  Patient History OB History  Gravida Para Term Preterm AB Living  3 2   2   2   SAB TAB Ectopic Multiple Live Births          2    # Outcome Date GA Lbr Len/2nd Weight Sex Delivery Anes PTL Lv  3 Current           2 Preterm 08/25/12 [redacted]w[redacted]d  3 lb 10 oz (1.644 kg) F CS-Unspec   LIV     Birth Comments: System Generated. Please review and update pregnancy details.  1 Preterm 06/21/06 [redacted]w[redacted]d  3 lb 7 oz (1.559 kg) M CS-Unspec  Y LIV     Birth Comments: PreE    Past Medical History:  Diagnosis Date  . Anemia    low iron  . Anxiety   . Asthma   . Complication of anesthesia    bp dropped in middle of last c-section and started with n/v  . GERD (gastroesophageal reflux disease)    during pregnancy  . Headache    "couple days/week" (10/28/2015)  . Hyperthyroidism    not on medication  . PE (pulmonary embolism) 11/2011  . Pregnancy induced hypertension   . Preterm labor     Past Surgical History:  Procedure Laterality Date   . CESAREAN SECTION  2008; 2014  . FRACTURE SURGERY    . ORIF ANKLE FRACTURE Right 10/28/2015  . ORIF ANKLE FRACTURE Right 10/28/2015   Procedure: OPEN REDUCTION INTERNAL FIXATION (ORIF) RIGHT ANKLE FRACTURE;  Surgeon: Yolonda Kida, MD;  Location: [facility] OR;  Service: Orthopedics;  Laterality: Right;    Social History   Socioeconomic History  . Marital status: Married    Spouse name: Not on file  . Number of children: 2  . Years of education: 29  . Highest education level: Not on file  Occupational History  . Occupation: Clinical biochemist  Social Needs  . Financial resource strain: Not on file  . Food insecurity:    Worry: Not on file    Inability: Not on file  . Transportation needs:    Medical: Not on file    Non-medical: Not on file  Tobacco Use  . Smoking status: Never Smoker  . Smokeless tobacco: Never Used  Substance and Sexual Activity  . Alcohol use: No  . Drug use: No  . Sexual activity: Yes    Birth control/protection: None  Lifestyle  . Physical activity:    Days per week: Not on file    Minutes per  session: Not on file  . Stress: Not on file  Relationships  . Social connections:    Talks on phone: Not on file    Gets together: Not on file    Attends religious service: Not on file    Active member of club or organization: Not on file    Attends meetings of clubs or organizations: Not on file    Relationship status: Not on file  Other Topics Concern  . Not on file  Social History Narrative   [place]. Currently resides in an apartment with her husband and 2 children. No pets. Fun: Work, sleep   Denies religious beliefs that would effect health care.     Family History  Problem Relation Age of Onset  . Hypertension Maternal Grandfather   . Hypertension Paternal Grandmother   . Cancer Paternal Grandmother   . Hyperlipidemia Paternal Grandmother   . Hypertension Father   . Cancer Sister        skin   . Cancer Maternal Grandmother   . Rheum arthritis Mother   . Lupus Mother   . Hypothyroidism Maternal Aunt   . Lupus Other    In addition, the patient has no family history of mental retardation, birth defects, or genetic diseases.  Physical Examination Vitals - BP 116/82, Pulse 91, Weight 208 lbs. General appearance - alert, well appearing, and in no distress Mental status - alert, oriented to person, place, and time  Assessment and Recommendations 1.  Previous preeclampsia.  Given that she has had 2 prior pregnancies with relatively early onset she is at very high risk of recurrent preeclampsia in this pregnancy.  Should this develop Debra Horn would alos be at risk for fetal growth disturbance, preterm delivery, and IUFD.  Baseline labs, including a urine protein/creatinine ratio, have been completed, but I did not see a set of LFTs in Debra Horn's electronic medical record.  If these have not been completed, they should be drawn soon.  I also recommend Debra Horn a daily baby aspirin as this may decrease her risk of preeclampsia recurrence. 2.  Fetal growth restriction in previous pregnancies.  Given this history Debra Horn should have month growth ultrasounds for the remainder of pregnancy.  If fetal growth restriction develops Debra Horn should also have weekly BPPs and Dopplers. 3.  Mild persistent asthma.  Given the frequency of Ms. Pernice's symptoms and albuterol use she meets criteria for mild persistent asthma.  Thus, she should be started on an inhaled steroid to reduce her reliance of a rescue inhaler. 4.  Hyperthyroidism.  Debra Horn reports that she was diagnosed with this in her first pregnancy and was told that is was due to the severe hyperemesis she experienced in that pregnancy.  She is uncertain if her thyroid function was evaluated outside of pregnancy.  A TSH done with her NOB labs was normal, indicating she is euthyroid at this time.  I don't that Debra Horn  has true hyperthyroidism.  However, if concern persists she should have another set of thyroid function testing performed in the 3rd trimester. 5.  Prior DVT/PE.  Debra Horn is currently on prophylactic enoxaparin, which is the most appropriate treatment given her history.  Although not essential, at 36 weeks Debra Horn can be transitioned to unfractionated heparin to increase the likelihood that the patient will be a candidate for neuraxial analgesia at the time of delivery.  While prophylactic low-molecular weight heparin is  not associated with an increased risk of hemorrhage, neuraxial analgesia is general contraindicated within 12-24 hours of administration of any low-molecular weight heparin.  If on unfractionated heparin neuraxial analgesia is safe provided the patient's PTT is normal.  Prophylactic anticoagulation should be discontinued 24 hours prior to any planned induction or operative procedure.  It can be restarted when bleeding risk is sufficiently low to permit its use safely which is generally between 24 and 48 hours after a procedure.  Anticoagulation should be continued through 6 weeks postpartum.  This can be accomplished either by resuming low-molecular weight heparin or starting warfarin per patient and provider preference. 6.  Tooth pain.  I recommended that Debra Horn see a dentist to address this issue.  Routine dental work is not contraindicated in pregnancy, but does carry an increased risk of bleeding.  Thus, all but the most invasive dental procedures can be performed safely in pregnancy.  I spent 30 minutes with Debra Horn today of which 50% was face-to-face counseling.  Thank you for referring Debra Horn to the [facility].  Please do not hesitate to contact us with questions.   Rema Fendt, MD

## 2017-05-01 ENCOUNTER — Encounter: Payer: Medicaid Other | Admitting: Obstetrics & Gynecology

## 2017-05-01 ENCOUNTER — Other Ambulatory Visit: Payer: Medicaid Other

## 2017-05-08 ENCOUNTER — Encounter (INDEPENDENT_AMBULATORY_CARE_PROVIDER_SITE_OTHER): Payer: Self-pay

## 2017-05-09 ENCOUNTER — Institutional Professional Consult (permissible substitution): Payer: Medicaid Other | Admitting: Pulmonary Disease

## 2017-05-15 ENCOUNTER — Encounter: Payer: Self-pay | Admitting: *Deleted

## 2017-05-15 ENCOUNTER — Other Ambulatory Visit (HOSPITAL_COMMUNITY): Payer: Self-pay | Admitting: *Deleted

## 2017-05-15 ENCOUNTER — Encounter (HOSPITAL_COMMUNITY): Payer: Self-pay

## 2017-05-15 ENCOUNTER — Other Ambulatory Visit (HOSPITAL_COMMUNITY): Payer: Self-pay | Admitting: Maternal and Fetal Medicine

## 2017-05-15 ENCOUNTER — Ambulatory Visit (HOSPITAL_COMMUNITY)
Admission: RE | Admit: 2017-05-15 | Discharge: 2017-05-15 | Disposition: A | Discharge status: Home / Self Care | Payer: Medicaid Other | Source: Ambulatory Visit | Attending: Certified Nurse Midwife | Admitting: Certified Nurse Midwife

## 2017-05-15 DIAGNOSIS — E669 Obesity, unspecified: Secondary | ICD-10-CM | POA: Diagnosis not present

## 2017-05-15 DIAGNOSIS — Z86711 Personal history of pulmonary embolism: Secondary | ICD-10-CM | POA: Insufficient documentation

## 2017-05-15 DIAGNOSIS — Z3A28 28 weeks gestation of pregnancy: Secondary | ICD-10-CM

## 2017-05-15 DIAGNOSIS — O34219 Maternal care for unspecified type scar from previous cesarean delivery: Secondary | ICD-10-CM | POA: Insufficient documentation

## 2017-05-15 DIAGNOSIS — Z8759 Personal history of other complications of pregnancy, childbirth and the puerperium: Secondary | ICD-10-CM

## 2017-05-15 DIAGNOSIS — O365930 Maternal care for other known or suspected poor fetal growth, third trimester, not applicable or unspecified: Secondary | ICD-10-CM

## 2017-05-15 DIAGNOSIS — O99213 Obesity complicating pregnancy, third trimester: Secondary | ICD-10-CM | POA: Diagnosis not present

## 2017-05-15 DIAGNOSIS — O4103X0 Oligohydramnios, third trimester, not applicable or unspecified: Secondary | ICD-10-CM

## 2017-05-15 DIAGNOSIS — O09293 Supervision of pregnancy with other poor reproductive or obstetric history, third trimester: Secondary | ICD-10-CM

## 2017-05-15 DIAGNOSIS — O09213 Supervision of pregnancy with history of pre-term labor, third trimester: Secondary | ICD-10-CM | POA: Diagnosis not present

## 2017-05-15 DIAGNOSIS — Z98891 History of uterine scar from previous surgery: Secondary | ICD-10-CM

## 2017-05-15 DIAGNOSIS — Z362 Encounter for other antenatal screening follow-up: Secondary | ICD-10-CM

## 2017-05-15 DIAGNOSIS — O99283 Endocrine, nutritional and metabolic diseases complicating pregnancy, third trimester: Secondary | ICD-10-CM

## 2017-05-15 DIAGNOSIS — E0590 Thyrotoxicosis, unspecified without thyrotoxic crisis or storm: Secondary | ICD-10-CM

## 2017-05-15 DIAGNOSIS — O09893 Supervision of other high risk pregnancies, third trimester: Secondary | ICD-10-CM

## 2017-05-23 ENCOUNTER — Ambulatory Visit (HOSPITAL_COMMUNITY)
Admission: RE | Admit: 2017-05-23 | Discharge: 2017-05-23 | Disposition: A | Discharge status: Home / Self Care | Payer: Medicaid Other | Source: Ambulatory Visit | Attending: Certified Nurse Midwife | Admitting: Certified Nurse Midwife

## 2017-05-23 ENCOUNTER — Other Ambulatory Visit (HOSPITAL_COMMUNITY): Payer: Self-pay | Admitting: Maternal and Fetal Medicine

## 2017-05-23 ENCOUNTER — Encounter (HOSPITAL_COMMUNITY): Payer: Self-pay

## 2017-05-23 DIAGNOSIS — O99213 Obesity complicating pregnancy, third trimester: Secondary | ICD-10-CM

## 2017-05-23 DIAGNOSIS — O88219 Thromboembolism in pregnancy, unspecified trimester: Secondary | ICD-10-CM

## 2017-05-23 DIAGNOSIS — O09213 Supervision of pregnancy with history of pre-term labor, third trimester: Secondary | ICD-10-CM | POA: Insufficient documentation

## 2017-05-23 DIAGNOSIS — O34219 Maternal care for unspecified type scar from previous cesarean delivery: Secondary | ICD-10-CM | POA: Insufficient documentation

## 2017-05-23 DIAGNOSIS — O09299 Supervision of pregnancy with other poor reproductive or obstetric history, unspecified trimester: Secondary | ICD-10-CM

## 2017-05-23 DIAGNOSIS — Z98891 History of uterine scar from previous surgery: Secondary | ICD-10-CM

## 2017-05-23 DIAGNOSIS — O365930 Maternal care for other known or suspected poor fetal growth, third trimester, not applicable or unspecified: Secondary | ICD-10-CM | POA: Diagnosis not present

## 2017-05-23 DIAGNOSIS — O09211 Supervision of pregnancy with history of pre-term labor, first trimester: Secondary | ICD-10-CM

## 2017-05-23 DIAGNOSIS — Z3A29 29 weeks gestation of pregnancy: Secondary | ICD-10-CM

## 2017-05-23 DIAGNOSIS — O88213 Thromboembolism in pregnancy, third trimester: Secondary | ICD-10-CM | POA: Insufficient documentation

## 2017-05-23 DIAGNOSIS — O09293 Supervision of pregnancy with other poor reproductive or obstetric history, third trimester: Secondary | ICD-10-CM | POA: Diagnosis not present

## 2017-05-23 NOTE — Addendum Note (Signed)
Encounter addended by: [name] Arena, RT on: 05/23/2017 11:07 AM  Actions taken: Imaging Exam ended

## 2017-05-30 ENCOUNTER — Ambulatory Visit (HOSPITAL_COMMUNITY)
Admission: RE | Admit: 2017-05-30 | Discharge: 2017-05-30 | Disposition: A | Discharge status: Home / Self Care | Payer: Medicaid Other | Source: Ambulatory Visit | Attending: Certified Nurse Midwife | Admitting: Certified Nurse Midwife

## 2017-05-30 ENCOUNTER — Other Ambulatory Visit (HOSPITAL_COMMUNITY): Payer: Self-pay | Admitting: Maternal and Fetal Medicine

## 2017-05-30 ENCOUNTER — Encounter (HOSPITAL_COMMUNITY): Payer: Self-pay

## 2017-05-30 DIAGNOSIS — Z98891 History of uterine scar from previous surgery: Secondary | ICD-10-CM | POA: Diagnosis not present

## 2017-05-30 DIAGNOSIS — Z86711 Personal history of pulmonary embolism: Secondary | ICD-10-CM | POA: Diagnosis not present

## 2017-05-30 DIAGNOSIS — Z3A30 30 weeks gestation of pregnancy: Secondary | ICD-10-CM | POA: Insufficient documentation

## 2017-05-30 DIAGNOSIS — O99213 Obesity complicating pregnancy, third trimester: Secondary | ICD-10-CM | POA: Diagnosis not present

## 2017-05-30 DIAGNOSIS — O365930 Maternal care for other known or suspected poor fetal growth, third trimester, not applicable or unspecified: Secondary | ICD-10-CM

## 2017-05-30 DIAGNOSIS — O09213 Supervision of pregnancy with history of pre-term labor, third trimester: Secondary | ICD-10-CM | POA: Insufficient documentation

## 2017-05-30 DIAGNOSIS — O09299 Supervision of pregnancy with other poor reproductive or obstetric history, unspecified trimester: Secondary | ICD-10-CM

## 2017-05-31 ENCOUNTER — Other Ambulatory Visit: Payer: Medicaid Other

## 2017-05-31 ENCOUNTER — Encounter: Payer: Medicaid Other | Admitting: Obstetrics and Gynecology

## 2017-06-04 ENCOUNTER — Telehealth: Payer: Self-pay | Admitting: *Deleted

## 2017-06-04 ENCOUNTER — Other Ambulatory Visit: Payer: Self-pay | Admitting: Certified Nurse Midwife

## 2017-06-04 DIAGNOSIS — Z98891 History of uterine scar from previous surgery: Secondary | ICD-10-CM

## 2017-06-04 NOTE — Telephone Encounter (Signed)
Called patient to reschedule missed Glucose/OB appt. No answer and unable to leave a voicemail.Marland Kitchen..Marland Kitchen

## 2017-06-06 ENCOUNTER — Ambulatory Visit (INDEPENDENT_AMBULATORY_CARE_PROVIDER_SITE_OTHER): Payer: Medicaid Other | Admitting: Obstetrics and Gynecology

## 2017-06-06 ENCOUNTER — Encounter: Payer: Self-pay | Admitting: Obstetrics and Gynecology

## 2017-06-06 ENCOUNTER — Other Ambulatory Visit (HOSPITAL_COMMUNITY): Payer: Self-pay | Admitting: Maternal and Fetal Medicine

## 2017-06-06 ENCOUNTER — Encounter (HOSPITAL_COMMUNITY): Payer: Self-pay

## 2017-06-06 ENCOUNTER — Other Ambulatory Visit (HOSPITAL_COMMUNITY): Payer: Self-pay | Admitting: *Deleted

## 2017-06-06 ENCOUNTER — Ambulatory Visit (HOSPITAL_COMMUNITY)
Admission: RE | Admit: 2017-06-06 | Discharge: 2017-06-06 | Disposition: A | Discharge status: Home / Self Care | Payer: Medicaid Other | Source: Ambulatory Visit | Attending: Certified Nurse Midwife | Admitting: Certified Nurse Midwife

## 2017-06-06 VITALS — BP 118/78 | HR 94 | Wt 211.1 lb

## 2017-06-06 DIAGNOSIS — O99213 Obesity complicating pregnancy, third trimester: Secondary | ICD-10-CM

## 2017-06-06 DIAGNOSIS — Z3A31 31 weeks gestation of pregnancy: Secondary | ICD-10-CM | POA: Diagnosis not present

## 2017-06-06 DIAGNOSIS — O09299 Supervision of pregnancy with other poor reproductive or obstetric history, unspecified trimester: Secondary | ICD-10-CM

## 2017-06-06 DIAGNOSIS — E669 Obesity, unspecified: Secondary | ICD-10-CM | POA: Insufficient documentation

## 2017-06-06 DIAGNOSIS — E0590 Thyrotoxicosis, unspecified without thyrotoxic crisis or storm: Secondary | ICD-10-CM

## 2017-06-06 DIAGNOSIS — O34219 Maternal care for unspecified type scar from previous cesarean delivery: Secondary | ICD-10-CM | POA: Diagnosis not present

## 2017-06-06 DIAGNOSIS — Z8751 Personal history of pre-term labor: Secondary | ICD-10-CM

## 2017-06-06 DIAGNOSIS — O365930 Maternal care for other known or suspected poor fetal growth, third trimester, not applicable or unspecified: Secondary | ICD-10-CM

## 2017-06-06 DIAGNOSIS — O09293 Supervision of pregnancy with other poor reproductive or obstetric history, third trimester: Secondary | ICD-10-CM | POA: Diagnosis not present

## 2017-06-06 DIAGNOSIS — O368130 Decreased fetal movements, third trimester, not applicable or unspecified: Secondary | ICD-10-CM | POA: Diagnosis not present

## 2017-06-06 DIAGNOSIS — O88213 Thromboembolism in pregnancy, third trimester: Secondary | ICD-10-CM

## 2017-06-06 DIAGNOSIS — O09213 Supervision of pregnancy with history of pre-term labor, third trimester: Secondary | ICD-10-CM | POA: Insufficient documentation

## 2017-06-06 DIAGNOSIS — Z86711 Personal history of pulmonary embolism: Secondary | ICD-10-CM

## 2017-06-06 DIAGNOSIS — Z362 Encounter for other antenatal screening follow-up: Secondary | ICD-10-CM

## 2017-06-06 DIAGNOSIS — Z98891 History of uterine scar from previous surgery: Secondary | ICD-10-CM

## 2017-06-06 DIAGNOSIS — Z3009 Encounter for other general counseling and advice on contraception: Secondary | ICD-10-CM

## 2017-06-06 DIAGNOSIS — O09893 Supervision of other high risk pregnancies, third trimester: Secondary | ICD-10-CM

## 2017-06-06 DIAGNOSIS — O0993 Supervision of high risk pregnancy, unspecified, third trimester: Secondary | ICD-10-CM

## 2017-06-06 NOTE — ED Notes (Signed)
Pt reports leaking small amt of watery fluid x 1 month with coughing or laughing.  Headache yesterday that resolved after resting.  Denies headache today.

## 2017-06-06 NOTE — Patient Instructions (Signed)
Third Trimester of Pregnancy The third trimester is from week 28 through week 40 (months 7 through 9). The third trimester is a time when the unborn baby (fetus) is growing rapidly. At the end of the ninth month, the fetus is about 20 inches in length and weighs 6-10 pounds. Body changes during your third trimester Your body will continue to go through many changes during pregnancy. The changes vary from woman to woman. During the third trimester:  Your weight will continue to increase. You can expect to gain 25-35 pounds (11-16 kg) by the end of the pregnancy.  You may begin to get stretch marks on your hips, abdomen, and breasts.  You may urinate more often because the fetus is moving lower into your pelvis and pressing on your bladder.  You may develop or continue to have heartburn. This is caused by increased hormones that slow down muscles in the digestive tract.  You may develop or continue to have constipation because increased hormones slow digestion and cause the muscles that push waste through your intestines to relax.  You may develop hemorrhoids. These are swollen veins (varicose veins) in the rectum that can itch or be painful.  You may develop swollen, bulging veins (varicose veins) in your legs.  You may have increased body aches in the pelvis, back, or thighs. This is due to weight gain and increased hormones that are relaxing your joints.  You may have changes in your hair. These can include thickening of your hair, rapid growth, and changes in texture. Some women also have hair loss during or after pregnancy, or hair that feels dry or thin. Your hair will most likely return to normal after your baby is born.  Your breasts will continue to grow and they will continue to become tender. A yellow fluid (colostrum) may leak from your breasts. This is the first milk you are producing for your baby.  Your belly button may stick out.  You may notice more swelling in your hands,  face, or ankles.  You may have increased tingling or numbness in your hands, arms, and legs. The skin on your belly may also feel numb.  You may feel short of breath because of your expanding uterus.  You may have more problems sleeping. This can be caused by the size of your belly, increased need to urinate, and an increase in your body's metabolism.  You may notice the fetus "dropping," or moving lower in your abdomen (lightening).  You may have increased vaginal discharge.  You may notice your joints feel loose and you may have pain around your pelvic bone.  What to expect at prenatal visits You will have prenatal exams every 2 weeks until week 36. Then you will have weekly prenatal exams. During a routine prenatal visit:  You will be weighed to make sure you and the baby are growing normally.  Your blood pressure will be taken.  Your abdomen will be measured to track your baby's growth.  The fetal heartbeat will be listened to.  Any test results from the previous visit will be discussed.  You may have a cervical check near your due date to see if your cervix has softened or thinned (effaced).  You will be tested for Group B streptococcus. This happens between 35 and 37 weeks.  Your health care provider may ask you:  What your birth plan is.  How you are feeling.  If you are feeling the baby move.  If you have had   any abnormal symptoms, such as leaking fluid, bleeding, severe headaches, or abdominal cramping.  If you are using any tobacco products, including cigarettes, chewing tobacco, and electronic cigarettes.  If you have any questions.  Other tests or screenings that may be performed during your third trimester include:  Blood tests that check for low iron levels (anemia).  Fetal testing to check the health, activity level, and growth of the fetus. Testing is done if you have certain medical conditions or if there are problems during the  pregnancy.  Nonstress test (NST). This test checks the health of your baby to make sure there are no signs of problems, such as the baby not getting enough oxygen. During this test, a belt is placed around your belly. The baby is made to move, and its heart rate is monitored during movement.  What is false labor? False labor is a condition in which you feel small, irregular tightenings of the muscles in the womb (contractions) that usually go away with rest, changing position, or drinking water. These are called Braxton Hicks contractions. Contractions may last for hours, days, or even weeks before true labor sets in. If contractions come at regular intervals, become more frequent, increase in intensity, or become painful, you should see your health care provider. What are the signs of labor?  Abdominal cramps.  Regular contractions that start at 10 minutes apart and become stronger and more frequent with time.  Contractions that start on the top of the uterus and spread down to the lower abdomen and back.  Increased pelvic pressure and dull back pain.  A watery or bloody mucus discharge that comes from the vagina.  Leaking of amniotic fluid. This is also known as your "water breaking." It could be a slow trickle or a gush. Let your health care provider know if it has a color or strange odor. If you have any of these signs, call your health care provider right away, even if it is before your due date. Follow these instructions at home: Medicines  Follow your health care provider's instructions regarding medicine use. Specific medicines may be either safe or unsafe to take during pregnancy.  Take a prenatal vitamin that contains at least 600 micrograms (mcg) of folic acid.  If you develop constipation, try taking a stool softener if your health care provider approves. Eating and drinking  Eat a balanced diet that includes fresh fruits and vegetables, whole grains, good sources of protein  such as meat, eggs, or tofu, and low-fat dairy. Your health care provider will help you determine the amount of weight gain that is right for you.  Avoid raw meat and uncooked cheese. These carry germs that can cause birth defects in the baby.  If you have low calcium intake from food, talk to your health care provider about whether you should take a daily calcium supplement.  Eat four or five small meals rather than three large meals a day.  Limit foods that are high in fat and processed sugars, such as fried and sweet foods.  To prevent constipation: ? Drink enough fluid to keep your urine clear or pale yellow. ? Eat foods that are high in fiber, such as fresh fruits and vegetables, whole grains, and beans. Activity  Exercise only as directed by your health care provider. Most women can continue their usual exercise routine during pregnancy. Try to exercise for 30 minutes at least 5 days a week. Stop exercising if you experience uterine contractions.  Avoid heavy   lifting.  Do not exercise in extreme heat or humidity, or at high altitudes.  Wear low-heel, comfortable shoes.  Practice good posture.  You may continue to have sex unless your health care provider tells you otherwise. Relieving pain and discomfort  Take frequent breaks and rest with your legs elevated if you have leg cramps or low back pain.  Take warm sitz baths to soothe any pain or discomfort caused by hemorrhoids. Use hemorrhoid cream if your health care provider approves.  Wear a good support bra to prevent discomfort from breast tenderness.  If you develop varicose veins: ? Wear support pantyhose or compression stockings as told by your healthcare provider. ? Elevate your feet for 15 minutes, 3-4 times a day. Prenatal care  Write down your questions. Take them to your prenatal visits.  Keep all your prenatal visits as told by your health care provider. This is important. Safety  Wear your seat belt at  all times when driving.  Make a list of emergency phone numbers, including numbers for family, friends, the hospital, and police and fire departments. General instructions  Avoid cat litter boxes and soil used by cats. These carry germs that can cause birth defects in the baby. If you have a cat, ask someone to clean the litter box for you.  Do not travel far distances unless it is absolutely necessary and only with the approval of your health care provider.  Do not use hot tubs, steam rooms, or saunas.  Do not drink alcohol.  Do not use any products that contain nicotine or tobacco, such as cigarettes and e-cigarettes. If you need help quitting, ask your health care provider.  Do not use any medicinal herbs or unprescribed drugs. These chemicals affect the formation and growth of the baby.  Do not douche or use tampons or scented sanitary pads.  Do not cross your legs for long periods of time.  To prepare for the arrival of your baby: ? Take prenatal classes to understand, practice, and ask questions about labor and delivery. ? Make a trial run to the hospital. ? Visit the hospital and tour the maternity area. ? Arrange for maternity or paternity leave through employers. ? Arrange for family and friends to take care of pets while you are in the hospital. ? Purchase a rear-facing car seat and make sure you know how to install it in your car. ? Pack your hospital bag. ? Prepare the baby's nursery. Make sure to remove all pillows and stuffed animals from the baby's crib to prevent suffocation.  Visit your dentist if you have not gone during your pregnancy. Use a soft toothbrush to brush your teeth and be gentle when you floss. Contact a health care provider if:  You are unsure if you are in labor or if your water has broken.  You become dizzy.  You have mild pelvic cramps, pelvic pressure, or nagging pain in your abdominal area.  You have lower back pain.  You have persistent  nausea, vomiting, or diarrhea.  You have an unusual or bad smelling vaginal discharge.  You have pain when you urinate. Get help right away if:  Your water breaks before 37 weeks.  You have regular contractions less than 5 minutes apart before 37 weeks.  You have a fever.  You are leaking fluid from your vagina.  You have spotting or bleeding from your vagina.  You have severe abdominal pain or cramping.  You have rapid weight loss or weight gain.    You have shortness of breath with chest pain.  You notice sudden or extreme swelling of your face, hands, ankles, feet, or legs.  Your baby makes fewer than 10 movements in 2 hours.  You have severe headaches that do not go away when you take medicine.  You have vision changes. Summary  The third trimester is from week 28 through week 40, months 7 through 9. The third trimester is a time when the unborn baby (fetus) is growing rapidly.  During the third trimester, your discomfort may increase as you and your baby continue to gain weight. You may have abdominal, leg, and back pain, sleeping problems, and an increased need to urinate.  During the third trimester your breasts will keep growing and they will continue to become tender. A yellow fluid (colostrum) may leak from your breasts. This is the first milk you are producing for your baby.  False labor is a condition in which you feel small, irregular tightenings of the muscles in the womb (contractions) that eventually go away. These are called Braxton Hicks contractions. Contractions may last for hours, days, or even weeks before true labor sets in.  Signs of labor can include: abdominal cramps; regular contractions that start at 10 minutes apart and become stronger and more frequent with time; watery or bloody mucus discharge that comes from the vagina; increased pelvic pressure and dull back pain; and leaking of amniotic fluid. This information is not intended to replace advice  given to you by your health care provider. Make sure you discuss any questions you have with your health care provider. Document Released: 12/13/2000 Document Revised: 05/27/2015 Document Reviewed: 02/20/2012 [employer]vier Interactive Patient Education  2017 [employer]  

## 2017-06-06 NOTE — Progress Notes (Signed)
Subjective:  Luz BrazenLauren A Bell is a 32 y.o. [id number] at 2238w5d being seen today for ongoing prenatal care.  She is currently monitored for the following issues for this high-risk pregnancy and has Hx pulmonary embolism; Supervision of high-risk pregnancy; Previous cesarean section; Family history of systemic lupus erythematosus (SLE) in mother; Hyperthyroidism; Normocytic anemia; History of asthma; Hx of preeclampsia, prior pregnancy, currently pregnant; History of preterm delivery; Vitamin D deficiency; and IUGR (intrauterine growth restriction) in prior pregnancy, pregnant on their problem list.  Patient reports general discomforts of pregnancy.  Contractions: Irregular. Vag. Bleeding: None.  Movement: Present. Denies leaking of fluid.   The following portions of the patient's history were reviewed and updated as appropriate: allergies, current medications, past family history, past medical history, past social history, past surgical history and problem list. Problem list updated.  Objective:   Vitals:   06/06/17 1049  BP: 118/78  Pulse: 94  Weight: 211 lb 1.6 oz (95.8 kg)    Fetal Status: Fetal Heart Rate (bpm): 145   Movement: Present     General:  Alert, oriented and cooperative. Patient is in no acute distress.  Skin: Skin is warm and dry. No rash noted.   Cardiovascular: Normal heart rate noted  Respiratory: Normal respiratory effort, no problems with respiration noted  Abdomen: Soft, gravid, appropriate for gestational age. Pain/Pressure: Present     Pelvic:  Cervical exam deferred        Extremities: Normal range of motion.  Edema: None  Mental Status: Normal mood and affect. Normal behavior. Normal judgment and thought content.   Urinalysis:      Assessment and Plan:  Pregnancy: [id number] at 6838w5d  1. Supervision of high risk pregnancy in third trimester Stable Pt has missed several appts. Discussed importance of keeping OB appts. Pt will return on Friday for 28 week  labs  2. Previous cesarean section For repeat  3. IUGR (intrauterine growth restriction) in prior pregnancy, pregnant Growth today 46 % and normal dopplers F/U growth in 3 weeks Continue with weekly BPP and dopplers  4. Hx pulmonary embolism Stable Continue with Lovenox  5. Hx of preeclampsia, prior pregnancy, currently pregnant BP stable Pt not taking BASA  6. History of preterm delivery D/T IUGR and PEC  7. Hyperthyroidism Will check TSH with glucola on Friday  Preterm labor symptoms and general obstetric precautions including but not limited to vaginal bleeding, contractions, leaking of fluid and fetal movement were reviewed in detail with the patient. Please refer to After Visit Summary for other counseling recommendations.  Return in about 2 weeks (around 06/20/2017) for OB visit.   Hermina StaggersErvin, Wendell Fiebig L, MD

## 2017-06-08 ENCOUNTER — Other Ambulatory Visit: Payer: Medicaid Other

## 2017-06-20 ENCOUNTER — Encounter: Payer: Self-pay | Admitting: Obstetrics and Gynecology

## 2017-06-20 ENCOUNTER — Inpatient Hospital Stay (HOSPITAL_COMMUNITY)
Admission: AD | Admit: 2017-06-20 | Discharge: 2017-06-20 | Disposition: A | Discharge status: Home / Self Care | Payer: Medicaid Other | Source: Ambulatory Visit | Attending: Obstetrics & Gynecology | Admitting: Obstetrics & Gynecology

## 2017-06-20 ENCOUNTER — Other Ambulatory Visit: Payer: Medicaid Other

## 2017-06-20 ENCOUNTER — Ambulatory Visit (INDEPENDENT_AMBULATORY_CARE_PROVIDER_SITE_OTHER): Payer: Medicaid Other | Admitting: Obstetrics and Gynecology

## 2017-06-20 ENCOUNTER — Encounter (HOSPITAL_COMMUNITY): Payer: Self-pay | Admitting: *Deleted

## 2017-06-20 ENCOUNTER — Other Ambulatory Visit: Payer: Self-pay

## 2017-06-20 ENCOUNTER — Encounter (HOSPITAL_COMMUNITY): Payer: Self-pay

## 2017-06-20 VITALS — BP 138/98 | HR 96 | Wt 215.4 lb

## 2017-06-20 DIAGNOSIS — Z3A34 34 weeks gestation of pregnancy: Secondary | ICD-10-CM | POA: Insufficient documentation

## 2017-06-20 DIAGNOSIS — O133 Gestational [pregnancy-induced] hypertension without significant proteinuria, third trimester: Secondary | ICD-10-CM | POA: Diagnosis present

## 2017-06-20 DIAGNOSIS — Z8751 Personal history of pre-term labor: Secondary | ICD-10-CM

## 2017-06-20 DIAGNOSIS — Z86711 Personal history of pulmonary embolism: Secondary | ICD-10-CM

## 2017-06-20 DIAGNOSIS — O09299 Supervision of pregnancy with other poor reproductive or obstetric history, unspecified trimester: Secondary | ICD-10-CM

## 2017-06-20 DIAGNOSIS — R51 Headache: Secondary | ICD-10-CM

## 2017-06-20 DIAGNOSIS — R519 Headache, unspecified: Secondary | ICD-10-CM

## 2017-06-20 DIAGNOSIS — O368190 Decreased fetal movements, unspecified trimester, not applicable or unspecified: Secondary | ICD-10-CM

## 2017-06-20 DIAGNOSIS — O26893 Other specified pregnancy related conditions, third trimester: Secondary | ICD-10-CM

## 2017-06-20 DIAGNOSIS — O368130 Decreased fetal movements, third trimester, not applicable or unspecified: Secondary | ICD-10-CM

## 2017-06-20 DIAGNOSIS — E0590 Thyrotoxicosis, unspecified without thyrotoxic crisis or storm: Secondary | ICD-10-CM

## 2017-06-20 DIAGNOSIS — O0993 Supervision of high risk pregnancy, unspecified, third trimester: Secondary | ICD-10-CM

## 2017-06-20 DIAGNOSIS — Z98891 History of uterine scar from previous surgery: Secondary | ICD-10-CM

## 2017-06-20 LAB — COMPREHENSIVE METABOLIC PANEL
ALBUMIN: 2.7 g/dL — AB (ref 3.5–5.0)
ALK PHOS: 90 U/L (ref 38–126)
ALT: 9 U/L — AB (ref 14–54)
AST: 14 U/L — AB (ref 15–41)
Anion gap: 9 (ref 5–15)
BUN: 11 mg/dL (ref 6–20)
CALCIUM: 8.9 mg/dL (ref 8.9–10.3)
CO2: 19 mmol/L — ABNORMAL LOW (ref 22–32)
CREATININE: 0.5 mg/dL (ref 0.44–1.00)
Chloride: 106 mmol/L (ref 101–111)
GFR calc Af Amer: >60 mL/min (ref >60)
GLUCOSE: 74 mg/dL (ref 65–99)
Potassium: 3.8 mmol/L (ref 3.5–5.1)
Sodium: 134 mmol/L — ABNORMAL LOW (ref 135–145)
Total Bilirubin: 0.3 mg/dL (ref 0.3–1.2)
Total Protein: 7.1 g/dL (ref 6.5–8.1)

## 2017-06-20 LAB — URINALYSIS, ROUTINE W REFLEX MICROSCOPIC
BILIRUBIN URINE: NEGATIVE
GLUCOSE, UA: NEGATIVE mg/dL
Hgb urine dipstick: NEGATIVE
KETONES UR: NEGATIVE mg/dL
Leukocytes, UA: NEGATIVE
Nitrite: NEGATIVE
PH: 6 (ref 5.0–8.0)
Protein, ur: NEGATIVE mg/dL
Specific Gravity, Urine: 1.02 (ref 1.005–1.030)

## 2017-06-20 LAB — PROTEIN / CREATININE RATIO, URINE
CREATININE, URINE: 201 mg/dL
Protein Creatinine Ratio: 0.07 mg/mg{Cre} (ref 0.00–0.15)
Total Protein, Urine: 14 mg/dL

## 2017-06-20 LAB — CBC
HCT: 30.4 % — ABNORMAL LOW (ref 36.0–46.0)
HEMOGLOBIN: 9.7 g/dL — AB (ref 12.0–15.0)
MCH: 26.1 pg (ref 26.0–34.0)
MCHC: 31.9 g/dL (ref 30.0–36.0)
MCV: 81.7 fL (ref 78.0–100.0)
Platelets: 351 10*3/uL (ref 150–400)
RBC: 3.72 MIL/uL — ABNORMAL LOW (ref 3.87–5.11)
RDW: 15.5 % (ref 11.5–15.5)
WBC: 9 10*3/uL (ref 4.0–10.5)

## 2017-06-20 MED ORDER — ACETAMINOPHEN 500 MG PO TABS
1000.0000 mg | ORAL_TABLET | Freq: Once | ORAL | Status: AC
  Administered 2017-06-20: 1000 mg via ORAL
  Filled 2017-06-20: qty 2

## 2017-06-20 MED ORDER — CYCLOBENZAPRINE HCL 10 MG PO TABS: 10.0000 mg | ORAL_TABLET | Freq: Once | ORAL | Status: DC

## 2017-06-20 NOTE — Discharge Instructions (Signed)

## 2017-06-20 NOTE — MAU Note (Addendum)
ddr sent her over, BP was high.  Has been having headaches for about a wk.  Had pre-eclampsia with 2 prior pregnancies.

## 2017-06-20 NOTE — MAU Note (Signed)
Urine in lab 

## 2017-06-20 NOTE — Progress Notes (Signed)
Debra Horn, patient is unable to do GTT today, she said that she ate Pasta @ 3 am today. She previously No Showed x3 for the GTT. C/o headaches 6/10, dizziness, auroras, Nausea x 1 week.  Lots of pain and pressure.  Decreased movement, NST.  Wants a maternity belt.

## 2017-06-20 NOTE — Progress Notes (Signed)
   PRENATAL VISIT NOTE  Subjective:  Debra Horn is a 32 y.o. [id number] at 4074w5d being seen today for ongoing prenatal care.  She is currently monitored for the following issues for this high-risk pregnancy and has Hx pulmonary embolism; Supervision of high-risk pregnancy; Previous cesarean section; Family history of systemic lupus erythematosus (SLE) in mother; Hyperthyroidism; Normocytic anemia; History of asthma; Hx of preeclampsia, prior pregnancy, currently pregnant; History of preterm delivery; Vitamin D deficiency; IUGR (intrauterine growth restriction) in prior pregnancy, pregnant; and Unwanted fertility on their problem list.  Patient reports headache rating 6/10 for about a week, states it never goes away entirely, she can always feel it if she coughs/sneezes. Also reports contractions and increased pain and pressure. Also with nausea that has never really stopped..  Contractions: Irregular. Vag. Bleeding: None.  Movement: (!) Decreased. Denies leaking of fluid.   The following portions of the patient's history were reviewed and updated as appropriate: allergies, current medications, past family history, past medical history, past social history, past surgical history and problem list. Problem list updated.  Objective:   Vitals:   06/20/17 0921  BP: (!) 138/98  Pulse: 96  Weight: 215 lb 6.4 oz (97.7 kg)    Fetal Status: Fetal Heart Rate (bpm): NST   Movement: (!) Decreased     General:  Alert, oriented and cooperative. Patient is in no acute distress.  Skin: Skin is warm and dry. No rash noted.   Cardiovascular: Normal heart rate noted  Respiratory: Normal respiratory effort, no problems with respiration noted  Abdomen: Soft, gravid, appropriate for gestational age.  Pain/Pressure: Present     Pelvic: Cervical exam deferred        Extremities: Normal range of motion.  Edema: None  Mental Status: Normal mood and affect. Normal behavior. Normal judgment and thought content.    Assessment and Plan:  Pregnancy: [id number] at 7274w5d  1. Decreased fetal movement during pregnancy, antepartum, single or unspecified fetus NST reactive - Fetal nonstress test; Future  2. Supervision of high risk pregnancy in third trimester Has not done her GTT, missed several visits Reviewed importance of attending for glucose testing, she verbalizes understanding  3. Hx pulmonary embolism On lovenox, reports she is compliant  4. Previous cesarean section H/o CS x 2 Scheduled RCS for 39 weeks today  5. Hx of preeclampsia, prior pregnancy, currently pregnant Not on baby ASA  6. History of preterm delivery D/t IUGR & PEC  7. IUGR (intrauterine growth restriction) in prior pregnancy, pregnant Getting growth and dopplers, last were normal  8. Hyperthyroidism Missed TSH draw Will recheck today  To MAU for intractable headache and increased BP today. She states she has to arrange childcare but she will go to MAU.   Preterm labor symptoms and general obstetric precautions including but not limited to vaginal bleeding, contractions, leaking of fluid and fetal movement were reviewed in detail with the patient. Please refer to After Visit Summary for other counseling recommendations.  Return in about 1 week (around 06/27/2017) for OB visit (MD).  Future Appointments  Date Time Provider Department Center  06/22/2017  8:00 AM [facility] None  06/27/2017  7:45 AM [facility] US 2 [facility]  06/27/2017 10:15 AM Brock BadHarper, Charles A, MD [facility] None    Conan BowensKelly M Davis, MD

## 2017-06-20 NOTE — MAU Provider Note (Signed)
History     CSN: [id number]  Arrival date and time: 06/20/17 1212   First Provider Initiated Contact with Patient 06/20/17 1332      Chief Complaint  Patient presents with  . Headache  . Hypertension   HPI   Debra Horn is a 32 y.o. female G3P0202 @ 48w5dhere in MAU with Ha and elevated BP readings. She was sent up from the clinic this morning for labs. Says she started having a HA 1 week ago. The HA is located on her right side of her HA. She currently rates her HA pain 6/10. She took tylenol 500 mg this morning around 5:00 am which did not help. Says the HA eased off later in the morning however it is starting to creep back up.   OB History    Gravida  3   Para  2   Term      Preterm  2   AB      Living  2     SAB      TAB      Ectopic      Multiple      Live Births  2           Past Medical History:  Diagnosis Date  . Anemia    low iron  . Asthma   . Complication of anesthesia    bp dropped in middle of last c-section and started with n/v  . GERD (gastroesophageal reflux disease)    during pregnancy  . Headache    "couple days/week" (10/28/2015)  . Hyperthyroidism    not on medication  . PE (pulmonary embolism) 11/2011  . Pregnancy induced hypertension   . Preterm labor     Past Surgical History:  Procedure Laterality Date  . CESAREAN SECTION  2008; 2014  . FRACTURE SURGERY    . ORIF ANKLE FRACTURE Right 10/28/2015  . ORIF ANKLE FRACTURE Right 10/28/2015   Procedure: OPEN REDUCTION INTERNAL FIXATION (ORIF) RIGHT ANKLE FRACTURE;  Surgeon: JNicholes Stairs MD;  [facility]  Service: Orthopedics;  Laterality: Right;    Family History  Problem Relation Age of Onset  . Hypertension Maternal Grandfather   . Hypertension Paternal Grandmother   . Cancer Paternal Grandmother   . Hyperlipidemia Paternal Grandmother   . Hypertension Father   . Cancer Sister        skin  . Cancer Maternal Grandmother   . Rheum arthritis  Mother   . Lupus Mother   . Hypothyroidism Maternal Aunt   . Lupus Other     Social History   Tobacco Use  . Smoking status: Never Smoker  . Smokeless tobacco: Never Used  Substance Use Topics  . Alcohol use: No  . Drug use: No    Allergies:  Allergies  Allergen Reactions  . Banana Anaphylaxis  . Cefixime Rash    Medications Prior to Admission  Medication Sig Dispense Refill Last Dose  . aspirin EC 81 MG tablet Take 1 tablet (81 mg total) by mouth daily. Take after 12 weeks for prevention of preeclampsia later in pregnancy (Patient not taking: Reported on 06/06/2017) 300 tablet 2 Not Taking  . enoxaparin (LOVENOX) 30 MG/0.3ML injection Inject 0.3 mLs (30 mg total) into the skin every 12 (twelve) hours. 30 Syringe 12 Taking  . ferrous sulfate (FERROUSUL) 325 (65 FE) MG tablet Take 1 tablet (325 mg total) by mouth daily with breakfast. 60 tablet 10 Taking  . glycopyrrolate (ROBINUL) 1  MG tablet Take 1 tablet (1 mg total) by mouth 3 (three) times daily. 30 tablet 2 Taking  . ibuprofen (ADVIL,MOTRIN) 600 MG tablet Take 1 tablet (600 mg total) by mouth every 6 (six) hours as needed. 12 tablet 0 Taking  . metoCLOPramide (REGLAN) 10 MG tablet Take 1 tablet (10 mg total) by mouth 3 (three) times daily before meals. 60 tablet 2 Taking  . Prenatal MV & Min w/FA-DHA (PRENATAL ADULT GUMMY/DHA/FA) 0.4-25 MG CHEW Chew 1 tablet by mouth daily. 30 tablet 10 Taking  . PROAIR HFA 108 (90 Base) MCG/ACT inhaler TAKE 2 PUFFS BY MOUTH EVERY 6 HOURS AS NEEDED FOR WHEEZE OR SHORTNESS OF BREATH 8.5 Inhaler 1 Taking  . Vitamin D, Ergocalciferol, (DRISDOL) 50000 units CAPS capsule Take 1 capsule (50,000 Units total) by mouth every 7 (seven) days. 30 capsule 2 Taking   Results for orders placed or performed during the hospital encounter of 06/20/17 (from the past 48 hour(s))  Protein / creatinine ratio, urine     Status: None   Collection Time: 06/20/17 12:10 PM  Result Value Ref Range   Creatinine, Urine  201.00 mg/dL   Total Protein, Urine 14 mg/dL    Comment: NO NORMAL RANGE ESTABLISHED FOR THIS TEST   Protein Creatinine Ratio 0.07 0.00 - 0.15 mg/mg[Cre]    Comment: Performed at [facility], [address] [place], [place] [address]  Urinalysis, Routine w reflex microscopic     Status: None   Collection Time: 06/20/17 12:10 PM  Result Value Ref Range   Color, Urine YELLOW YELLOW   APPearance CLEAR CLEAR   Specific Gravity, Urine 1.020 1.005 - 1.030   pH 6.0 5.0 - 8.0   Glucose, UA NEGATIVE NEGATIVE mg/dL   Hgb urine dipstick NEGATIVE NEGATIVE   Bilirubin Urine NEGATIVE NEGATIVE   Ketones, ur NEGATIVE NEGATIVE mg/dL   Protein, ur NEGATIVE NEGATIVE mg/dL   Nitrite NEGATIVE NEGATIVE   Leukocytes, UA NEGATIVE NEGATIVE    Comment: Performed at [facility], [address], [place], [place] [address]  CBC     Status: Abnormal   Collection Time: 06/20/17  1:38 PM  Result Value Ref Range   WBC 9.0 4.0 - 10.5 K/uL   RBC 3.72 (L) 3.87 - 5.11 MIL/uL   Hemoglobin 9.7 (L) 12.0 - 15.0 g/dL   HCT 30.4 (L) 36.0 - 46.0 %   MCV 81.7 78.0 - 100.0 fL   MCH 26.1 26.0 - 34.0 pg   MCHC 31.9 30.0 - 36.0 g/dL   RDW 15.5 11.5 - 15.5 %   Platelets 351 150 - 400 K/uL    Comment: Performed at [facility], [address], [place], [place] [address]  Comprehensive metabolic panel     Status: Abnormal   Collection Time: 06/20/17  1:38 PM  Result Value Ref Range   Sodium 134 (L) 135 - 145 mmol/L   Potassium 3.8 3.5 - 5.1 mmol/L   Chloride 106 101 - 111 mmol/L   CO2 19 (L) 22 - 32 mmol/L   Glucose, Bld 74 65 - 99 mg/dL   BUN 11 6 - 20 mg/dL   Creatinine, Ser 0.50 0.44 - 1.00 mg/dL   Calcium 8.9 8.9 - 10.3 mg/dL   Total Protein 7.1 6.5 - 8.1 g/dL   Albumin 2.7 (L) 3.5 - 5.0 g/dL   AST 14 (L) 15 - 41 U/L   ALT 9 (L) 14 - 54 U/L   Alkaline Phosphatase 90 38 - 126 U/L   Total  Bilirubin 0.3 0.3 - 1.2 mg/dL   GFR calc non Af Amer >60 >60 mL/min   GFR calc Af Amer >60 >60 mL/min     Comment: (NOTE) The eGFR has been calculated using the CKD EPI equation. This calculation has not been validated in all clinical situations. eGFR's persistently <60 mL/min signify possible Chronic Kidney Disease.    Anion gap 9 5 - 15    Comment: Performed at [facility], [address] [place], [place] [address]    Review of Systems  Constitutional: Negative for fever.  Eyes: Negative for photophobia and visual disturbance.  Gastrointestinal: Negative for abdominal pain.  Genitourinary: Negative for vaginal bleeding.  Neurological: Positive for headaches. Negative for dizziness.   Physical Exam   Blood pressure 126/86, pulse 71, temperature 98.7 F (37.1 C), temperature source Oral, resp. rate 18, weight 214 lb 12 oz (97.4 kg), last menstrual period 10/14/2016, SpO2 99 %.   Patient Vitals for the past 24 hrs:  BP Temp Temp src Pulse Resp SpO2 Weight  06/20/17 1531 117/73 98.2 F (36.8 C) Oral 80 20 - -  06/20/17 1501 114/62 - - 61 - - -  06/20/17 1446 108/70 - - 71 - - -  06/20/17 1431 119/81 - - 78 - - -  06/20/17 1416 112/87 - - 86 - - -  06/20/17 1401 119/73 - - 70 - - -  06/20/17 1345 127/84 - - 72 - - -  06/20/17 1331 126/86 - - 71 - - -  06/20/17 1315 124/81 - - 79 - - -  06/20/17 1300 (!) 136/94 - - 74 - - -  06/20/17 1252 130/85 - - 87 - - -  06/20/17 1234 (!) 136/99 98.7 F (37.1 C) Oral 77 18 99 % 214 lb 12 oz (97.4 kg)    Physical Exam  Constitutional: She is oriented to person, place, and time. She appears well-developed and well-nourished. No distress.  HENT:  Head: Normocephalic.  Eyes: Pupils are equal, round, and reactive to light.  Respiratory: Effort normal and breath sounds normal.  GI: Soft. She exhibits no distension. There is no tenderness. There is no rebound and no guarding.  Musculoskeletal: Normal range of motion.  Neurological: She is alert and oriented to person, place, and time. She has normal reflexes. She displays normal reflexes.   Negative clonus   Skin: Skin is warm. She is not diaphoretic.  Psychiatric: Her behavior is normal.   Fetal Tracing: Baseline: 125 bpm Variability: Moderate  Accelerations: 15x15 Decelerations: None Toco: None  MAU Course  Procedures  None  MDM  2 elevated BP on arrival, remain BP's WNL, patient's HA went from 6/10-1/10 after 1 gram of tylenol. Patient refused Flexeril and Is ready to go home   Assessment and Plan   A:  1. Gestational hypertension, third trimester   2. Pregnancy headache in third trimester     P:  Discharge home with strict precautions Pre-E precautions Patient is scheduled for an appointment Friday at 0800 for 2 hour GTT; add BP reading to appointment reason Return to MAU if symptoms worsen Ok to use tylenol as needed OTC as needed  Noni Saupe I, NP 06/22/2017 1:47 PM

## 2017-06-20 NOTE — Progress Notes (Signed)
Pt in lateral position, ctxs may not trace while in this position.

## 2017-06-22 ENCOUNTER — Other Ambulatory Visit: Payer: Medicaid Other

## 2017-06-24 ENCOUNTER — Inpatient Hospital Stay (HOSPITAL_COMMUNITY)
Admission: AD | Admit: 2017-06-24 | Discharge: 2017-06-24 | Disposition: A | Discharge status: Home / Self Care | Payer: Medicaid Other | Source: Ambulatory Visit | Attending: Obstetrics & Gynecology | Admitting: Obstetrics & Gynecology

## 2017-06-24 ENCOUNTER — Encounter (HOSPITAL_COMMUNITY): Payer: Self-pay | Admitting: *Deleted

## 2017-06-24 DIAGNOSIS — Z881 Allergy status to other antibiotic agents status: Secondary | ICD-10-CM | POA: Diagnosis not present

## 2017-06-24 DIAGNOSIS — E0590 Thyrotoxicosis, unspecified without thyrotoxic crisis or storm: Secondary | ICD-10-CM | POA: Diagnosis not present

## 2017-06-24 DIAGNOSIS — G44209 Tension-type headache, unspecified, not intractable: Secondary | ICD-10-CM | POA: Diagnosis not present

## 2017-06-24 DIAGNOSIS — Z3A34 34 weeks gestation of pregnancy: Secondary | ICD-10-CM | POA: Insufficient documentation

## 2017-06-24 DIAGNOSIS — O99283 Endocrine, nutritional and metabolic diseases complicating pregnancy, third trimester: Secondary | ICD-10-CM | POA: Insufficient documentation

## 2017-06-24 DIAGNOSIS — O09299 Supervision of pregnancy with other poor reproductive or obstetric history, unspecified trimester: Secondary | ICD-10-CM

## 2017-06-24 DIAGNOSIS — K219 Gastro-esophageal reflux disease without esophagitis: Secondary | ICD-10-CM | POA: Diagnosis not present

## 2017-06-24 DIAGNOSIS — O99613 Diseases of the digestive system complicating pregnancy, third trimester: Secondary | ICD-10-CM | POA: Insufficient documentation

## 2017-06-24 DIAGNOSIS — O9989 Other specified diseases and conditions complicating pregnancy, childbirth and the puerperium: Secondary | ICD-10-CM | POA: Diagnosis not present

## 2017-06-24 DIAGNOSIS — Z79899 Other long term (current) drug therapy: Secondary | ICD-10-CM | POA: Diagnosis not present

## 2017-06-24 DIAGNOSIS — Z808 Family history of malignant neoplasm of other organs or systems: Secondary | ICD-10-CM | POA: Insufficient documentation

## 2017-06-24 DIAGNOSIS — Z91018 Allergy to other foods: Secondary | ICD-10-CM | POA: Insufficient documentation

## 2017-06-24 DIAGNOSIS — O99513 Diseases of the respiratory system complicating pregnancy, third trimester: Secondary | ICD-10-CM | POA: Diagnosis not present

## 2017-06-24 DIAGNOSIS — Z9889 Other specified postprocedural states: Secondary | ICD-10-CM | POA: Insufficient documentation

## 2017-06-24 DIAGNOSIS — J45909 Unspecified asthma, uncomplicated: Secondary | ICD-10-CM | POA: Diagnosis not present

## 2017-06-24 DIAGNOSIS — O09293 Supervision of pregnancy with other poor reproductive or obstetric history, third trimester: Secondary | ICD-10-CM

## 2017-06-24 DIAGNOSIS — Z8249 Family history of ischemic heart disease and other diseases of the circulatory system: Secondary | ICD-10-CM | POA: Diagnosis not present

## 2017-06-24 DIAGNOSIS — Z7982 Long term (current) use of aspirin: Secondary | ICD-10-CM | POA: Insufficient documentation

## 2017-06-24 DIAGNOSIS — Z86711 Personal history of pulmonary embolism: Secondary | ICD-10-CM | POA: Insufficient documentation

## 2017-06-24 DIAGNOSIS — R51 Headache: Secondary | ICD-10-CM | POA: Insufficient documentation

## 2017-06-24 DIAGNOSIS — O26893 Other specified pregnancy related conditions, third trimester: Secondary | ICD-10-CM | POA: Insufficient documentation

## 2017-06-24 DIAGNOSIS — O163 Unspecified maternal hypertension, third trimester: Secondary | ICD-10-CM | POA: Diagnosis present

## 2017-06-24 LAB — URINALYSIS, ROUTINE W REFLEX MICROSCOPIC
Bilirubin Urine: NEGATIVE
Glucose, UA: NEGATIVE mg/dL
Hgb urine dipstick: NEGATIVE
Ketones, ur: 5 mg/dL — AB
Nitrite: NEGATIVE
PROTEIN: 30 mg/dL — AB
SPECIFIC GRAVITY, URINE: 1.028 (ref 1.005–1.030)
pH: 5 (ref 5.0–8.0)

## 2017-06-24 MED ORDER — METHOCARBAMOL 750 MG PO TABS: 750.0000 mg | ORAL_TABLET | Freq: Three times a day (TID) | ORAL | 0 refills | Status: DC | PRN

## 2017-06-24 MED ORDER — BUTALBITAL-APAP-CAFFEINE 50-325-40 MG PO TABS
2.0000 | ORAL_TABLET | Freq: Once | ORAL | Status: AC
  Administered 2017-06-24: 2 via ORAL
  Filled 2017-06-24: qty 2

## 2017-06-24 MED ORDER — RANITIDINE HCL 300 MG PO TABS: 300.0000 mg | ORAL_TABLET | Freq: Every day | ORAL | 0 refills | Status: DC

## 2017-06-24 MED ORDER — METHOCARBAMOL 750 MG PO TABS
750.0000 mg | ORAL_TABLET | Freq: Once | ORAL | Status: AC
  Administered 2017-06-24: 750 mg via ORAL
  Filled 2017-06-24: qty 1

## 2017-06-24 NOTE — Progress Notes (Signed)
Dr Nira Retortegele discussed d/c plan with pt. Written and verbal d/c instructions given and understanding voiced

## 2017-06-24 NOTE — Progress Notes (Signed)
Dr Nira Retortegele on unit and aware of pt's admission and assessment. Will see pt

## 2017-06-24 NOTE — Progress Notes (Signed)
Pt sitting up in bed and not tracing well. Dopplered FHTs at 135 before dc EFM. Dr Nira Retortegele reviewed tracing

## 2017-06-24 NOTE — Discharge Instructions (Signed)
Tension Headache A tension headache is pain, pressure, or aching that is felt over the front and sides of your head. These headaches can last from 30 minutes to several days. Follow these instructions at home: Managing pain  Take over-the-counter and prescription medicines only as told by your doctor.  Lie down in a dark, quiet room when you have a headache.  If directed, apply ice to your head and neck area: ? Put ice in a plastic bag. ? Place a towel between your skin and the bag. ? Leave the ice on for 20 minutes, 2-3 times per day.  Use a heating pad or a hot shower to apply heat to your head and neck area as told by your doctor. Eating and drinking  Eat meals on a regular schedule.  Do not drink a lot of alcohol.  Do not use a lot of caffeine, or stop using caffeine. General instructions  Keep all follow-up visits as told by your doctor. This is important.  Keep a journal to find out if certain things bring on headaches. For example, write down: ? What you eat and drink. ? How much sleep you get. ? Any change to your diet or medicines.  Try getting a massage, or doing other things that help you to relax.  Lessen stress.  Sit up straight. Do not tighten (tense) your muscles.  Do not use tobacco products. This includes cigarettes, chewing tobacco, or e-cigarettes. If you need help quitting, ask your doctor.  Exercise regularly as told by your doctor.  Get enough sleep. This may mean 7-9 hours of sleep. Contact a doctor if:  Your symptoms are not helped by medicine.  You have a headache that feels different from your usual headache.  You feel sick to your stomach (nauseous) or you throw up (vomit).  You have a fever. Get help right away if:  Your headache becomes very bad.  You keep throwing up.  You have a stiff neck.  You have trouble seeing.  You have trouble speaking.  You have pain in your eye or ear.  Your muscles are weak or you lose muscle  control.  You lose your balance or you have trouble walking.  You feel like you will pass out (faint) or you pass out.  You have confusion. This information is not intended to replace advice given to you by your health care provider. Make sure you discuss any questions you have with your health care provider. Document Released: 03/15/2009 Document Revised: 08/19/2015 Document Reviewed: 04/13/2014 Elsevier Interactive Patient Education  2018 [employer]  

## 2017-06-24 NOTE — MAU Provider Note (Signed)
History  CSN: [id number] Arrival date and time: 06/24/17 4098  First Provider Initiated Contact with Patient 06/24/17 [phone]      Chief Complaint  Patient presents with  . Hypertension  . Headache    HPI: Debra Horn is a 32 y.o. [id number] with IUP at [redacted]w[redacted]d who presents to maternity admissions reporting headache and concern for elevated BP. She reports she has had a headache for about 10 days. She was seen here for the headache a few days ago, and given Tylenol, which improved it some, but did not completely go away. She was offered Flexeril at the time as well, but pt refused. She reports headache has been persistent, and describes it as constant on the right side of her head radiating from the front to the to top of her head then down to the back of her head and neck. Has had occasional floaters. Denies blurry vision, epigastric/RUQ pain, SOB, or chest pain. She reports having pre-eclampsia with her last 2 pregnancies and having to be included at 32 and 34 weeks d/t this. Reports having headaches those times as well, but they were stronger than this headache. Last time she was seen for headache here, she had a couple of DBP in the 90s, otherwise BP were normal and PIH labs were also wnl.  Denies contractions, leakage of fluid or vaginal bleeding. Good fetal movement.  Also denies any abnormal vaginal discharge, vaginal bleeding, fevers, chills, malaise, dysuria, hematuria, urinary frequency. She is also c/o reflux symptoms and still has problems with N/V in the morning.   OB History  Gravida Para Term Preterm AB Living  3 2   2   2   SAB TAB Ectopic Multiple Live Births          2    # Outcome Date GA Lbr Len/2nd Weight Sex Delivery Anes PTL Lv  3 Current           2 Preterm 08/25/12 [redacted]w[redacted]d  3 lb 10 oz (1.644 kg) F CS-Unspec   LIV     Birth Comments: System Generated. Please review and update pregnancy details.  1 Preterm 06/21/06 [redacted]w[redacted]d  3 lb 7 oz (1.559 kg) M CS-Unspec  Y LIV     Birth  Comments: PreE   Past Medical History:  Diagnosis Date  . Anemia    low iron  . Asthma   . Complication of anesthesia    bp dropped in middle of last c-section and started with n/v  . GERD (gastroesophageal reflux disease)    during pregnancy  . Headache    "couple days/week" (10/28/2015)  . Hyperthyroidism    not on medication  . PE (pulmonary embolism) 11/2011  . Pregnancy induced hypertension   . Preterm labor    Past Surgical History:  Procedure Laterality Date  . CESAREAN SECTION  2008; 2014  . FRACTURE SURGERY    . ORIF ANKLE FRACTURE Right 10/28/2015  . ORIF ANKLE FRACTURE Right 10/28/2015   Procedure: OPEN REDUCTION INTERNAL FIXATION (ORIF) RIGHT ANKLE FRACTURE;  Surgeon: Yolonda Kida, MD;  Location: [facility];  Service: Orthopedics;  Laterality: Right;   Family History  Problem Relation Age of Onset  . Hypertension Maternal Grandfather   . Hypertension Paternal Grandmother   . Cancer Paternal Grandmother   . Hyperlipidemia Paternal Grandmother   . Hypertension Father   . Cancer Sister        skin  . Cancer Maternal Grandmother   . Rheum arthritis Mother   .  Lupus Mother   . Hypothyroidism Maternal Aunt   . Lupus Other    Social History   Socioeconomic History  . Marital status: Married    Spouse name: Not on file  . Number of children: 2  . Years of education: 51  . Highest education level: Not on file  Occupational History  . Occupation: Clinical biochemist  Social Needs  . Financial resource strain: Not on file  . Food insecurity:    Worry: Not on file    Inability: Not on file  . Transportation needs:    Medical: Not on file    Non-medical: Not on file  Tobacco Use  . Smoking status: Never Smoker  . Smokeless tobacco: Never Used  Substance and Sexual Activity  . Alcohol use: No  . Drug use: No  . Sexual activity: Yes    Birth control/protection: None  Lifestyle  . Physical activity:    Days per week: Not on file    Minutes per  session: Not on file  . Stress: Not on file  Relationships  . Social connections:    Talks on phone: Not on file    Gets together: Not on file    Attends religious service: Not on file    Active member of club or organization: Not on file    Attends meetings of clubs or organizations: Not on file    Relationship status: Not on file  . Intimate partner violence:    Fear of current or ex partner: Not on file    Emotionally abused: Not on file    Physically abused: Not on file    Forced sexual activity: Not on file  Other Topics Concern  . Not on file  Social History Narrative   Born in [place], [place] and raised in [place]. Currently resides in an apartment with her husband and 2 children. No pets. Fun: Work, sleep   Denies religious beliefs that would effect health care.    Allergies  Allergen Reactions  . Banana Anaphylaxis  . Cefixime Rash    Medications Prior to Admission  Medication Sig Dispense Refill Last Dose  . enoxaparin (LOVENOX) 30 MG/0.3ML injection Inject 0.3 mLs (30 mg total) into the skin every 12 (twelve) hours. 30 Syringe 12 06/23/2017 at 2230  . Prenatal MV & Min w/FA-DHA (PRENATAL ADULT GUMMY/DHA/FA) 0.4-25 MG CHEW Chew 1 tablet by mouth daily. 30 tablet 10 Past Month at Unknown time  . PROAIR HFA 108 (90 Base) MCG/ACT inhaler TAKE 2 PUFFS BY MOUTH EVERY 6 HOURS AS NEEDED FOR WHEEZE OR SHORTNESS OF BREATH 8.5 Inhaler 1 06/23/2017 at Unknown time  . aspirin EC 81 MG tablet Take 1 tablet (81 mg total) by mouth daily. Take after 12 weeks for prevention of preeclampsia later in pregnancy (Patient not taking: Reported on 06/06/2017) 300 tablet 2 Not Taking  . ferrous sulfate (FERROUSUL) 325 (65 FE) MG tablet Take 1 tablet (325 mg total) by mouth daily with breakfast. 60 tablet 10 Taking  . glycopyrrolate (ROBINUL) 1 MG tablet Take 1 tablet (1 mg total) by mouth 3 (three) times daily. 30 tablet 2 Taking  . metoCLOPramide (REGLAN) 10 MG tablet Take 1 tablet (10 mg  total) by mouth 3 (three) times daily before meals. 60 tablet 2 Taking  . Vitamin D, Ergocalciferol, (DRISDOL) 50000 units CAPS capsule Take 1 capsule (50,000 Units total) by mouth every 7 (seven) days. 30 capsule 2 Taking    I have reviewed patient's Past Medical Hx,  Surgical Hx, Family Hx, Social Hx, medications and allergies.   Review of Systems: Negative except for what is mentioned in HPI.  Physical Exam   Blood pressure 124/84, pulse 78, temperature 98.4 F (36.9 C), resp. rate 20, height 5\' 3"  (1.6 m), weight 214 lb (97.1 kg), last menstrual period 10/14/2016.  Constitutional: Well-developed, well-nourished female in no acute distress.  HENT: [place]/AT, normal oropharynx mucosa. MMM Eyes: normal conjunctivae, no scleral icterus Cardiovascular: normal rate, regular rhythm Respiratory: normal effort, lungs CTAB.  GI: Abd soft, non-tender on all quadrants, gravid appropriate for gestational age.   Pelvic: deferred SVE: FT/thick/high MSK: Extremities nontender, no edema Neurologic: Alert and oriented x 4. Cns II-XII intact Psych: Normal mood and affect Skin: warm and dry   FHT:  Baseline 135 , moderate variability, accelerations present, no decelerations Toco: occasional contractions  MAU Course/MDM:   Nursing notes and VS reviewed. Patient seen and examined, as noted above.  Reactive NST  BPs are wnl Patient Vitals for the past 4 hrs:  BP Temp Pulse Resp Height Weight  06/24/17 0446 123/83 - 80 - - -  06/24/17 0431 131/88 - 82 - - -  06/24/17 0416 124/84 - 78 - - -  06/24/17 0409 127/86 - 86 - - -  06/24/17 0408 127/86 98.4 F (36.9 C) 86 20 5\' 3"  (1.6 m) 214 lb (97.1 kg)   Likely tension headache given pattern of headache. Pt hesitant to take flexeril like last time.  Fioricet and robaxin given. Headache improved with the above meds.  She notes some discomfort with laying down. She also reports persistent reflux symptoms and nausea with laying down. She is not on any  reflux medications  Assessment and Plan  Assessment: 1. Tension headache   2. Gastroesophageal reflux disease without esophagitis   3. Elevated blood pressure affecting pregnancy in third trimester, antepartum   4. Hx of preeclampsia, prior pregnancy, currently pregnant     Plan: --Rx for robaxin prn for tension headahce --Rx for Zantac qhs for GER --Reviewed pre-eclampsia precautions at length. Keep PNV appt in 3 days for close follow up on BP. She did have a DBP in the 90s last MAU visit, although BPs okay here. Given her hx high risk of developing preEclampsia, but BP here today reassuring. --Discharge home in stable condition.     Degele, Kandra NicolasJulie P, MD 06/24/2017 4:39 AM  Future Appointments  Date Time Provider Department Center  06/27/2017  7:45 AM [facility] US 2 [facility]  06/27/2017 10:15 AM Brock BadHarper, Charles A, MD [facility] None

## 2017-06-24 NOTE — MAU Note (Addendum)
Headache for last 10days. Was seen here Weds per pt and given meds. Helped some but h/a has never really gone away. Headache more on R side and back of head and neck. Worse with sneeze or cough. Some floaters. Denies epigastric pain, LOF or vag bleeding

## 2017-06-24 NOTE — Progress Notes (Signed)
Pt did cough and states felt h/a  but just resting h/a is gone

## 2017-06-27 ENCOUNTER — Ambulatory Visit (HOSPITAL_COMMUNITY)
Admission: RE | Admit: 2017-06-27 | Discharge: 2017-06-27 | Disposition: A | Discharge status: Home / Self Care | Payer: Medicaid Other | Source: Ambulatory Visit | Attending: Certified Nurse Midwife | Admitting: Certified Nurse Midwife

## 2017-06-27 ENCOUNTER — Other Ambulatory Visit: Payer: Self-pay | Admitting: Obstetrics and Gynecology

## 2017-06-27 ENCOUNTER — Encounter (HOSPITAL_COMMUNITY): Payer: Self-pay

## 2017-06-27 ENCOUNTER — Ambulatory Visit (INDEPENDENT_AMBULATORY_CARE_PROVIDER_SITE_OTHER): Payer: Medicaid Other | Admitting: Obstetrics

## 2017-06-27 ENCOUNTER — Other Ambulatory Visit: Payer: Self-pay

## 2017-06-27 ENCOUNTER — Encounter: Payer: Self-pay | Admitting: Obstetrics

## 2017-06-27 ENCOUNTER — Other Ambulatory Visit (HOSPITAL_COMMUNITY): Payer: Self-pay | Admitting: Maternal & Fetal Medicine

## 2017-06-27 ENCOUNTER — Other Ambulatory Visit (HOSPITAL_COMMUNITY): Payer: Self-pay | Admitting: *Deleted

## 2017-06-27 VITALS — BP 134/89 | HR 98 | Wt 217.4 lb

## 2017-06-27 DIAGNOSIS — O09213 Supervision of pregnancy with history of pre-term labor, third trimester: Principal | ICD-10-CM

## 2017-06-27 DIAGNOSIS — O09893 Supervision of other high risk pregnancies, third trimester: Secondary | ICD-10-CM | POA: Insufficient documentation

## 2017-06-27 DIAGNOSIS — O365930 Maternal care for other known or suspected poor fetal growth, third trimester, not applicable or unspecified: Secondary | ICD-10-CM | POA: Diagnosis present

## 2017-06-27 DIAGNOSIS — O09299 Supervision of pregnancy with other poor reproductive or obstetric history, unspecified trimester: Secondary | ICD-10-CM

## 2017-06-27 DIAGNOSIS — Z86711 Personal history of pulmonary embolism: Secondary | ICD-10-CM

## 2017-06-27 DIAGNOSIS — O99213 Obesity complicating pregnancy, third trimester: Secondary | ICD-10-CM

## 2017-06-27 DIAGNOSIS — Z98891 History of uterine scar from previous surgery: Secondary | ICD-10-CM

## 2017-06-27 DIAGNOSIS — O34219 Maternal care for unspecified type scar from previous cesarean delivery: Secondary | ICD-10-CM | POA: Insufficient documentation

## 2017-06-27 DIAGNOSIS — Z3A34 34 weeks gestation of pregnancy: Secondary | ICD-10-CM | POA: Insufficient documentation

## 2017-06-27 DIAGNOSIS — Z8751 Personal history of pre-term labor: Secondary | ICD-10-CM

## 2017-06-27 DIAGNOSIS — O09293 Supervision of pregnancy with other poor reproductive or obstetric history, third trimester: Secondary | ICD-10-CM

## 2017-06-27 DIAGNOSIS — O0993 Supervision of high risk pregnancy, unspecified, third trimester: Secondary | ICD-10-CM

## 2017-06-27 DIAGNOSIS — E0590 Thyrotoxicosis, unspecified without thyrotoxic crisis or storm: Secondary | ICD-10-CM

## 2017-06-27 NOTE — Progress Notes (Signed)
Subjective:  Luz BrazenLauren A Koors is a 32 y.o. [id number] at 156w5d being seen today for ongoing prenatal care.  She is currently monitored for the following issues for this high-risk pregnancy and has Hx pulmonary embolism; Supervision of high-risk pregnancy; Previous cesarean section; Family history of systemic lupus erythematosus (SLE) in mother; Hyperthyroidism; Normocytic anemia; History of asthma; Hx of preeclampsia, prior pregnancy, currently pregnant; History of preterm delivery; Vitamin D deficiency; IUGR (intrauterine growth restriction) in prior pregnancy, pregnant; Unwanted fertility; Obesity affecting pregnancy in third trimester; Poor fetal growth affecting management of mother in third trimester; Previous cesarean delivery affecting pregnancy; Previous pregnancy complicated by pregnancy-induced hypertension in third trimester, antepartum; and [redacted] weeks gestation of pregnancy on their problem list.  Patient reports headache.  Contractions: Irregular. Vag. Bleeding: None.  Movement: Present. Denies leaking of fluid.   The following portions of the patient's history were reviewed and updated as appropriate: allergies, current medications, past family history, past medical history, past social history, past surgical history and problem list. Problem list updated.  Objective:   Vitals:   06/27/17 1025  BP: 134/89  Pulse: 98  Weight: 217 lb 6.4 oz (98.6 kg)    Fetal Status:     Movement: Present     General:  Alert, oriented and cooperative. Patient is in no acute distress.  Skin: Skin is warm and dry. No rash noted.   Cardiovascular: Normal heart rate noted  Respiratory: Normal respiratory effort, no problems with respiration noted  Abdomen: Soft, gravid, appropriate for gestational age. Pain/Pressure: Present     Pelvic:  Cervical exam deferred        Extremities: Normal range of motion.  Edema: None  Mental Status: Normal mood and affect. Normal behavior. Normal judgment and thought  content.   Urinalysis:      Assessment and Plan:  Pregnancy: [id number] at 556w5d  1. Supervision of high risk pregnancy in third trimester  2. Previous cesarean section - for repeat C/S and BTL at 39 weeks or sooner if fetal assessment breaksdown  3. History of preterm delivery  4. Hx of preeclampsia, prior pregnancy, currently pregnant - weekly fetal assessment and growth ultrasound monthly  5. Hx pulmonary embolism  6. IUGR (intrauterine growth restriction) in prior pregnancy, pregnant  7. Hyperthyroidism - stable  Preterm labor symptoms and general obstetric precautions including but not limited to vaginal bleeding, contractions, leaking of fluid and fetal movement were reviewed in detail with the patient. Please refer to After Visit Summary for other counseling recommendations.  Return in about 1 week (around 07/04/2017) for ROB.   Brock BadHarper, Johnisha Louks A, MD

## 2017-07-04 ENCOUNTER — Ambulatory Visit (HOSPITAL_COMMUNITY)
Admission: RE | Admit: 2017-07-04 | Discharge: 2017-07-04 | Disposition: A | Discharge status: Home / Self Care | Payer: Medicaid Other | Source: Ambulatory Visit | Attending: Obstetrics & Gynecology | Admitting: Obstetrics & Gynecology

## 2017-07-04 ENCOUNTER — Ambulatory Visit (INDEPENDENT_AMBULATORY_CARE_PROVIDER_SITE_OTHER): Payer: Medicaid Other | Admitting: Obstetrics & Gynecology

## 2017-07-04 VITALS — BP 138/88 | HR 88 | Wt 217.7 lb

## 2017-07-04 DIAGNOSIS — O09299 Supervision of pregnancy with other poor reproductive or obstetric history, unspecified trimester: Secondary | ICD-10-CM

## 2017-07-04 DIAGNOSIS — O0993 Supervision of high risk pregnancy, unspecified, third trimester: Secondary | ICD-10-CM

## 2017-07-04 MED ORDER — PANTOPRAZOLE SODIUM 40 MG PO TBEC: 40.0000 mg | DELAYED_RELEASE_TABLET | Freq: Every day | ORAL | 1 refills | Status: DC

## 2017-07-04 NOTE — Progress Notes (Signed)
Patient reports fetal movement, states that she is having headaches, and blurred vision so bad that she had trouble driving today.

## 2017-07-04 NOTE — Progress Notes (Signed)
   PRENATAL VISIT NOTE  Subjective:  Debra Horn is a 32 y.o. [id number] at 7515w5d being seen today for ongoing prenatal care.  She is currently monitored for the following issues for this high-risk pregnancy and has Hx pulmonary embolism; Supervision of high-risk pregnancy; Previous cesarean section; Family history of systemic lupus erythematosus (SLE) in mother; Hyperthyroidism; Normocytic anemia; History of asthma; Hx of preeclampsia, prior pregnancy, currently pregnant; History of preterm delivery; Vitamin D deficiency; IUGR (intrauterine growth restriction) in prior pregnancy, pregnant; Unwanted fertility; Obesity affecting pregnancy in third trimester; Poor fetal growth affecting management of mother in third trimester; Previous cesarean delivery affecting pregnancy; Previous pregnancy complicated by pregnancy-induced hypertension in third trimester, antepartum; and [redacted] weeks gestation of pregnancy on their problem list.  Patient reports headache, vomiting and blurred vision.  Contractions: Irregular. Vag. Bleeding: None.  Movement: Present. Denies leaking of fluid.   The following portions of the patient's history were reviewed and updated as appropriate: allergies, current medications, past family history, past medical history, past social history, past surgical history and problem list. Problem list updated.  Objective:   Vitals:   07/04/17 1617  BP: 138/88  Pulse: 88  Weight: 217 lb 11.2 oz (98.7 kg)    Fetal Status: Fetal Heart Rate (bpm): 131   Movement: Present     General:  Alert, oriented and cooperative. Affect is flat and she does appear to feel well  Skin: Skin is warm and dry. No rash noted.   Cardiovascular: Normal heart rate noted  Respiratory: Normal respiratory effort, no problems with respiration noted  Abdomen: Soft, gravid, appropriate for gestational age.  Pain/Pressure: Present     Pelvic: Cervical exam deferred        Extremities: Normal range of motion.   Edema: None  Mental Status: Normal mood and affect. Normal behavior. Normal judgment and thought content.   Assessment and Plan:  Pregnancy: [id number] at 3515w5d  1. Hx of preeclampsia, prior pregnancy, currently pregnant Has several symptoms with concern for preeclampsia today and she is worried  2. Supervision of high risk pregnancy in third trimester To MAU for evaluation and fetal monitoring. She missed her BPP appt today  Term labor symptoms and general obstetric precautions including but not limited to vaginal bleeding, contractions, leaking of fluid and fetal movement were reviewed in detail with the patient. Please refer to After Visit Summary for other counseling recommendations.  Return in about 5 days (around 07/09/2017).  Future Appointments  Date Time Provider Department Center  07/11/2017  9:15 AM [facility] US 4 [facility]  07/11/2017 11:00 AM Brock BadHarper, Charles A, MD [facility] None  07/18/2017  9:15 AM [facility] US 4 [facility]    Scheryl DarterJames Ashya Nicolaisen, MD

## 2017-07-04 NOTE — Patient Instructions (Signed)
Preeclampsia and Eclampsia °Preeclampsia is a serious condition that develops only during pregnancy. It is also called toxemia of pregnancy. This condition causes high blood pressure along with other symptoms, such as swelling and headaches. These symptoms may develop as the condition gets worse. Preeclampsia may occur at 20 weeks of pregnancy or later. °Diagnosing and treating preeclampsia early is very important. If not treated early, it can cause serious problems for you and your baby. One problem it can lead to is eclampsia, which is a condition that causes muscle jerking or shaking (convulsions or seizures) in the mother. Delivering your baby is the best treatment for preeclampsia or eclampsia. Preeclampsia and eclampsia symptoms usually go away after your baby is born. °What are the causes? °The cause of preeclampsia is not known. °What increases the risk? °The following risk factors make you more likely to develop preeclampsia: °· Being pregnant for the first time. °· Having had preeclampsia during a past pregnancy. °· Having a family history of preeclampsia. °· Having high blood pressure. °· Being pregnant with twins or triplets. °· Being 35 or older. °· Being African-American. °· Having kidney disease or diabetes. °· Having medical conditions such as lupus or blood diseases. °· Being very overweight (obese). ° °What are the signs or symptoms? °The earliest signs of preeclampsia are: °· High blood pressure. °· Increased protein in your urine. Your health care provider will check for this at every visit before you give birth (prenatal visit). ° °Other symptoms that may develop as the condition gets worse include: °· Severe headaches. °· Sudden weight gain. °· Swelling of the hands, face, legs, and feet. °· Nausea and vomiting. °· Vision problems, such as blurred or double vision. °· Numbness in the face, arms, legs, and feet. °· Urinating less than usual. °· Dizziness. °· Slurred speech. °· Abdominal pain,  especially upper abdominal pain. °· Convulsions or seizures. ° °Symptoms generally go away after giving birth. °How is this diagnosed? °There are no screening tests for preeclampsia. Your health care provider will ask you about symptoms and check for signs of preeclampsia during your prenatal visits. You may also have tests that include: °· Urine tests. °· Blood tests. °· Checking your blood pressure. °· Monitoring your baby’s heart rate. °· Ultrasound. ° °How is this treated? °You and your health care provider will determine the treatment approach that is best for you. Treatment may include: °· Having more frequent prenatal exams to check for signs of preeclampsia, if you have an increased risk for preeclampsia. °· Bed rest. °· Reducing how much salt (sodium) you eat. °· Medicine to lower your blood pressure. °· Staying in the hospital, if your condition is severe. There, treatment will focus on controlling your blood pressure and the amount of fluids in your body (fluid retention). °· You may need to take medicine (magnesium sulfate) to prevent seizures. This medicine may be given as an injection or through an IV tube. °· Delivering your baby early, if your condition gets worse. You may have your labor started with medicine (induced), or you may have a cesarean delivery. ° °Follow these instructions at home: °Eating and drinking ° °· Drink enough fluid to keep your urine clear or pale yellow. °· Eat a healthy diet that is low in sodium. Do not add salt to your food. Check nutrition labels to see how much sodium a food or beverage contains. °· Avoid caffeine. °Lifestyle °· Do not use any products that contain nicotine or tobacco, such as cigarettes   and e-cigarettes. If you need help quitting, ask your health care provider. °· Do not use alcohol or drugs. °· Avoid stress as much as possible. Rest and get plenty of sleep. °General instructions °· Take over-the-counter and prescription medicines only as told by your  health care provider. °· When lying down, lie on your side. This keeps pressure off of your baby. °· When sitting or lying down, raise (elevate) your feet. Try putting some pillows underneath your lower legs. °· Exercise regularly. Ask your health care provider what kinds of exercise are best for you. °· Keep all follow-up and prenatal visits as told by your health care provider. This is important. °How is this prevented? °To prevent preeclampsia or eclampsia from developing during another pregnancy: °· Get proper medical care during pregnancy. Your health care provider may be able to prevent preeclampsia or diagnose and treat it early. °· Your health care provider may have you take a low-dose aspirin or a calcium supplement during your next pregnancy. °· You may have tests of your blood pressure and kidney function after giving birth. °· Maintain a healthy weight. Ask your health care provider for help managing weight gain during pregnancy. °· Work with your health care provider to manage any long-term (chronic) health conditions you have, such as diabetes or kidney problems. ° °Contact a health care provider if: °· You gain more weight than expected. °· You have headaches. °· You have nausea or vomiting. °· You have abdominal pain. °· You feel dizzy or light-headed. °Get help right away if: °· You develop sudden or severe swelling anywhere in your body. This usually happens in the legs. °· You gain 5 lbs (2.3 kg) or more during one week. °· You have severe: °? Abdominal pain. °? Headaches. °? Dizziness. °? Vision problems. °? Confusion. °? Nausea or vomiting. °· You have a seizure. °· You have trouble moving any part of your body. °· You develop numbness in any part of your body. °· You have trouble speaking. °· You have any abnormal bleeding. °· You pass out. °This information is not intended to replace advice given to you by your health care provider. Make sure you discuss any questions you have with your health  care provider. °Document Released: 12/17/1999 Document Revised: 08/17/2015 Document Reviewed: 07/26/2015 °Elsevier Interactive Patient Education © 2018 [employer] ° °

## 2017-07-08 ENCOUNTER — Other Ambulatory Visit: Payer: Self-pay | Admitting: Obstetrics & Gynecology

## 2017-07-08 ENCOUNTER — Other Ambulatory Visit: Payer: Self-pay | Admitting: Family Medicine

## 2017-07-08 DIAGNOSIS — O0993 Supervision of high risk pregnancy, unspecified, third trimester: Secondary | ICD-10-CM

## 2017-07-11 ENCOUNTER — Ambulatory Visit (HOSPITAL_COMMUNITY)
Admission: RE | Admit: 2017-07-11 | Discharge: 2017-07-11 | Disposition: A | Discharge status: Home / Self Care | Payer: Medicaid Other | Source: Ambulatory Visit | Attending: Obstetrics | Admitting: Obstetrics

## 2017-07-11 ENCOUNTER — Other Ambulatory Visit (HOSPITAL_COMMUNITY)
Admission: RE | Admit: 2017-07-11 | Discharge: 2017-07-11 | Disposition: A | Discharge status: Home / Self Care | Payer: Medicaid Other | Source: Ambulatory Visit | Attending: Obstetrics | Admitting: Obstetrics

## 2017-07-11 ENCOUNTER — Other Ambulatory Visit (HOSPITAL_COMMUNITY): Payer: Self-pay | Admitting: Obstetrics and Gynecology

## 2017-07-11 ENCOUNTER — Ambulatory Visit (INDEPENDENT_AMBULATORY_CARE_PROVIDER_SITE_OTHER): Payer: Medicaid Other | Admitting: Obstetrics

## 2017-07-11 ENCOUNTER — Encounter (HOSPITAL_COMMUNITY): Payer: Self-pay

## 2017-07-11 ENCOUNTER — Encounter: Payer: Self-pay | Admitting: Obstetrics

## 2017-07-11 VITALS — BP 131/88 | HR 93 | Wt 218.3 lb

## 2017-07-11 DIAGNOSIS — O99213 Obesity complicating pregnancy, third trimester: Secondary | ICD-10-CM

## 2017-07-11 DIAGNOSIS — O09293 Supervision of pregnancy with other poor reproductive or obstetric history, third trimester: Secondary | ICD-10-CM

## 2017-07-11 DIAGNOSIS — O368130 Decreased fetal movements, third trimester, not applicable or unspecified: Secondary | ICD-10-CM | POA: Diagnosis not present

## 2017-07-11 DIAGNOSIS — Z3A36 36 weeks gestation of pregnancy: Secondary | ICD-10-CM | POA: Diagnosis not present

## 2017-07-11 DIAGNOSIS — O0993 Supervision of high risk pregnancy, unspecified, third trimester: Secondary | ICD-10-CM | POA: Insufficient documentation

## 2017-07-11 DIAGNOSIS — O365930 Maternal care for other known or suspected poor fetal growth, third trimester, not applicable or unspecified: Secondary | ICD-10-CM

## 2017-07-11 DIAGNOSIS — Z98891 History of uterine scar from previous surgery: Secondary | ICD-10-CM

## 2017-07-11 DIAGNOSIS — O34219 Maternal care for unspecified type scar from previous cesarean delivery: Secondary | ICD-10-CM | POA: Diagnosis not present

## 2017-07-11 MED ORDER — ALBUTEROL SULFATE HFA 108 (90 BASE) MCG/ACT IN AERS: INHALATION_SPRAY | RESPIRATORY_TRACT | 5 refills | Status: DC

## 2017-07-11 NOTE — Progress Notes (Signed)
Pt c/o HA's and visual disturbances.

## 2017-07-11 NOTE — Progress Notes (Signed)
Subjective:  Debra Horn is a 32 y.o. [id number] at [redacted]w[redacted]d being seen today for ongoing prenatal care.  She is currently monitored for the following issues for this high-risk pregnancy and has Hx pulmonary embolism; Supervision of high-risk pregnancy; Previous cesarean section; Family history of systemic lupus erythematosus (SLE) in mother; Hyperthyroidism; Normocytic anemia; History of asthma; Hx of preeclampsia, prior pregnancy, currently pregnant; History of preterm delivery; Vitamin D deficiency; IUGR (intrauterine growth restriction) in prior pregnancy, pregnant; Unwanted fertility; Obesity affecting pregnancy in third trimester; Poor fetal growth affecting management of mother in third trimester; Previous cesarean delivery affecting pregnancy; Previous pregnancy complicated by pregnancy-induced hypertension in third trimester, antepartum; and [redacted] weeks gestation of pregnancy on their problem list.  Patient reports headache and visual changes.  Contractions: Irregular. Vag. Bleeding: None.  Movement: Present. Denies leaking of fluid.   The following portions of the patient's history were reviewed and updated as appropriate: allergies, current medications, past family history, past medical history, past social history, past surgical history and problem list. Problem list updated.  Objective:   Vitals:   07/11/17 1110  BP: 131/88  Pulse: 93  Weight: 218 lb 4.8 oz (99 kg)    Fetal Status: Fetal Heart Rate (bpm): 140   Movement: Present     General:  Alert, oriented and cooperative. Patient is in no acute distress.  Skin: Skin is warm and dry. No rash noted.   Cardiovascular: Normal heart rate noted  Respiratory: Normal respiratory effort, no problems with respiration noted  Abdomen: Soft, gravid, appropriate for gestational age. Pain/Pressure: Present     Pelvic:  Cervical exam deferred        Extremities: Normal range of motion.  Edema: None  Mental Status: Normal mood and affect. Normal  behavior. Normal judgment and thought content.   Urinalysis:      Assessment and Plan:  Pregnancy: [id number] at [redacted]w[redacted]d  1. Supervision of high risk pregnancy in third trimester Rx: - Strep Gp B NAA - Cervicovaginal ancillary only  2. Previous cesarean section for pre-eclampsia Rx: - albuterol (PROAIR HFA) 108 (90 Base) MCG/ACT inhaler; TAKE 2 PUFFS BY MOUTH EVERY 6 HOURS AS NEEDED FOR WHEEZE OR SHORTNESS OF BREATH  Dispense: 8.5 Inhaler; Refill: 5   Preterm labor symptoms and general obstetric precautions including but not limited to vaginal bleeding, contractions, leaking of fluid and fetal movement were reviewed in detail with the patient. Please refer to After Visit Summary for other counseling recommendations.  Return in about 1 week (around 07/18/2017) for ROB.   Brock BadHarper, Riven Mabile A, MD

## 2017-07-12 ENCOUNTER — Other Ambulatory Visit: Payer: Self-pay | Admitting: Obstetrics

## 2017-07-12 DIAGNOSIS — B9689 Other specified bacterial agents as the cause of diseases classified elsewhere: Secondary | ICD-10-CM

## 2017-07-12 DIAGNOSIS — N760 Acute vaginitis: Principal | ICD-10-CM

## 2017-07-12 LAB — CERVICOVAGINAL ANCILLARY ONLY
Bacterial vaginitis: POSITIVE — AB
Candida vaginitis: NEGATIVE
Chlamydia: NEGATIVE
Neisseria Gonorrhea: NEGATIVE
TRICH (WINDOWPATH): NEGATIVE

## 2017-07-12 MED ORDER — TINIDAZOLE 500 MG PO TABS: 1000.0000 mg | ORAL_TABLET | Freq: Every day | ORAL | 2 refills | Status: DC

## 2017-07-13 ENCOUNTER — Telehealth (HOSPITAL_COMMUNITY): Payer: Self-pay | Admitting: *Deleted

## 2017-07-13 LAB — STREP GP B NAA: Strep Gp B NAA: NEGATIVE

## 2017-07-13 NOTE — Telephone Encounter (Signed)
Preadmission screen  

## 2017-07-16 ENCOUNTER — Telehealth (HOSPITAL_COMMUNITY): Payer: Self-pay | Admitting: *Deleted

## 2017-07-16 NOTE — Telephone Encounter (Signed)
Preadmission screen  

## 2017-07-17 ENCOUNTER — Inpatient Hospital Stay (HOSPITAL_COMMUNITY)
Admission: AD | Admit: 2017-07-17 | Discharge: 2017-07-17 | Disposition: A | Discharge status: Home / Self Care | Payer: Medicaid Other | Source: Ambulatory Visit | Attending: Family Medicine | Admitting: Family Medicine

## 2017-07-17 ENCOUNTER — Telehealth (HOSPITAL_COMMUNITY): Payer: Self-pay | Admitting: *Deleted

## 2017-07-17 ENCOUNTER — Encounter (HOSPITAL_COMMUNITY): Payer: Self-pay

## 2017-07-17 DIAGNOSIS — H538 Other visual disturbances: Secondary | ICD-10-CM | POA: Diagnosis not present

## 2017-07-17 DIAGNOSIS — O99613 Diseases of the digestive system complicating pregnancy, third trimester: Secondary | ICD-10-CM | POA: Insufficient documentation

## 2017-07-17 DIAGNOSIS — Z79899 Other long term (current) drug therapy: Secondary | ICD-10-CM | POA: Insufficient documentation

## 2017-07-17 DIAGNOSIS — R51 Headache: Secondary | ICD-10-CM | POA: Insufficient documentation

## 2017-07-17 DIAGNOSIS — J45909 Unspecified asthma, uncomplicated: Secondary | ICD-10-CM | POA: Insufficient documentation

## 2017-07-17 DIAGNOSIS — O99283 Endocrine, nutritional and metabolic diseases complicating pregnancy, third trimester: Secondary | ICD-10-CM | POA: Diagnosis not present

## 2017-07-17 DIAGNOSIS — O163 Unspecified maternal hypertension, third trimester: Secondary | ICD-10-CM

## 2017-07-17 DIAGNOSIS — O99513 Diseases of the respiratory system complicating pregnancy, third trimester: Secondary | ICD-10-CM | POA: Insufficient documentation

## 2017-07-17 DIAGNOSIS — R197 Diarrhea, unspecified: Secondary | ICD-10-CM | POA: Diagnosis not present

## 2017-07-17 DIAGNOSIS — Z3A37 37 weeks gestation of pregnancy: Secondary | ICD-10-CM | POA: Diagnosis not present

## 2017-07-17 DIAGNOSIS — K219 Gastro-esophageal reflux disease without esophagitis: Secondary | ICD-10-CM | POA: Insufficient documentation

## 2017-07-17 DIAGNOSIS — Z86711 Personal history of pulmonary embolism: Secondary | ICD-10-CM | POA: Insufficient documentation

## 2017-07-17 DIAGNOSIS — Z7982 Long term (current) use of aspirin: Secondary | ICD-10-CM | POA: Insufficient documentation

## 2017-07-17 DIAGNOSIS — O212 Late vomiting of pregnancy: Secondary | ICD-10-CM | POA: Insufficient documentation

## 2017-07-17 DIAGNOSIS — O9989 Other specified diseases and conditions complicating pregnancy, childbirth and the puerperium: Secondary | ICD-10-CM | POA: Insufficient documentation

## 2017-07-17 DIAGNOSIS — O26893 Other specified pregnancy related conditions, third trimester: Secondary | ICD-10-CM | POA: Insufficient documentation

## 2017-07-17 DIAGNOSIS — Z8249 Family history of ischemic heart disease and other diseases of the circulatory system: Secondary | ICD-10-CM | POA: Diagnosis not present

## 2017-07-17 DIAGNOSIS — E0590 Thyrotoxicosis, unspecified without thyrotoxic crisis or storm: Secondary | ICD-10-CM | POA: Diagnosis not present

## 2017-07-17 DIAGNOSIS — R519 Headache, unspecified: Secondary | ICD-10-CM

## 2017-07-17 DIAGNOSIS — R0989 Other specified symptoms and signs involving the circulatory and respiratory systems: Secondary | ICD-10-CM

## 2017-07-17 DIAGNOSIS — O34219 Maternal care for unspecified type scar from previous cesarean delivery: Secondary | ICD-10-CM | POA: Diagnosis not present

## 2017-07-17 DIAGNOSIS — Z881 Allergy status to other antibiotic agents status: Secondary | ICD-10-CM | POA: Insufficient documentation

## 2017-07-17 DIAGNOSIS — O26899 Other specified pregnancy related conditions, unspecified trimester: Secondary | ICD-10-CM

## 2017-07-17 DIAGNOSIS — R112 Nausea with vomiting, unspecified: Secondary | ICD-10-CM

## 2017-07-17 LAB — URINALYSIS, COMPLETE (UACMP) WITH MICROSCOPIC
Bilirubin Urine: NEGATIVE
GLUCOSE, UA: NEGATIVE mg/dL
Hgb urine dipstick: NEGATIVE
KETONES UR: NEGATIVE mg/dL
Leukocytes, UA: NEGATIVE
Nitrite: NEGATIVE
Protein, ur: 100 mg/dL — AB
Specific Gravity, Urine: 1.025 (ref 1.005–1.030)
pH: 6 (ref 5.0–8.0)

## 2017-07-17 LAB — COMPREHENSIVE METABOLIC PANEL
ALT: 11 U/L (ref 0–44)
AST: 18 U/L (ref 15–41)
Albumin: 2.8 g/dL — ABNORMAL LOW (ref 3.5–5.0)
Alkaline Phosphatase: 103 U/L (ref 38–126)
Anion gap: 12 (ref 5–15)
BILIRUBIN TOTAL: 0.7 mg/dL (ref 0.3–1.2)
BUN: 12 mg/dL (ref 6–20)
CO2: 17 mmol/L — ABNORMAL LOW (ref 22–32)
CREATININE: 0.65 mg/dL (ref 0.44–1.00)
Calcium: 8.8 mg/dL — ABNORMAL LOW (ref 8.9–10.3)
Chloride: 105 mmol/L (ref 98–111)
Glucose, Bld: 67 mg/dL — ABNORMAL LOW (ref 70–99)
Potassium: 3.8 mmol/L (ref 3.5–5.1)
Sodium: 134 mmol/L — ABNORMAL LOW (ref 135–145)
TOTAL PROTEIN: 7.4 g/dL (ref 6.5–8.1)

## 2017-07-17 LAB — PROTEIN / CREATININE RATIO, URINE
CREATININE, URINE: 407 mg/dL
Protein Creatinine Ratio: 0.16 mg/mg{Cre} — ABNORMAL HIGH (ref 0.00–0.15)
Total Protein, Urine: 65 mg/dL

## 2017-07-17 LAB — CBC
HEMATOCRIT: 30.9 % — AB (ref 36.0–46.0)
Hemoglobin: 9.6 g/dL — ABNORMAL LOW (ref 12.0–15.0)
MCH: 24.9 pg — AB (ref 26.0–34.0)
MCHC: 31.1 g/dL (ref 30.0–36.0)
MCV: 80.3 fL (ref 78.0–100.0)
Platelets: 356 10*3/uL (ref 150–400)
RBC: 3.85 MIL/uL — AB (ref 3.87–5.11)
RDW: 16.9 % — AB (ref 11.5–15.5)
WBC: 9.2 10*3/uL (ref 4.0–10.5)

## 2017-07-17 MED ORDER — DEXAMETHASONE SODIUM PHOSPHATE 10 MG/ML IJ SOLN
10.0000 mg | Freq: Once | INTRAMUSCULAR | Status: AC
  Administered 2017-07-17: 10 mg via INTRAVENOUS
  Filled 2017-07-17: qty 1

## 2017-07-17 MED ORDER — PROCHLORPERAZINE EDISYLATE 10 MG/2ML IJ SOLN
10.0000 mg | Freq: Once | INTRAMUSCULAR | Status: AC
  Administered 2017-07-17: 10 mg via INTRAVENOUS
  Filled 2017-07-17: qty 2

## 2017-07-17 MED ORDER — LACTATED RINGERS IV BOLUS
500.0000 mL | Freq: Once | INTRAVENOUS | Status: AC
  Administered 2017-07-17: 500 mL via INTRAVENOUS

## 2017-07-17 MED ORDER — DIPHENHYDRAMINE HCL 50 MG/ML IJ SOLN
25.0000 mg | Freq: Once | INTRAMUSCULAR | Status: AC
  Administered 2017-07-17: 25 mg via INTRAVENOUS
  Filled 2017-07-17: qty 1

## 2017-07-17 NOTE — MAU Note (Signed)
Urine in lab 

## 2017-07-17 NOTE — Discharge Instructions (Signed)

## 2017-07-17 NOTE — Telephone Encounter (Signed)
Preadmission screen  

## 2017-07-17 NOTE — MAU Provider Note (Signed)
Chief Complaint:  Nausea; Headache; Blurred Vision; Diarrhea; and Rupture of Membranes   First Provider Initiated Contact with Patient 07/17/17 2136     HPI: Debra Horn is a 32 y.o. Z6X0960 at 20w4dwho presents to maternity admissions reporting nausea, headache and diarrhea.  See below.She reports good fetal movement, denies LOF, vaginal bleeding, vaginal itching/burning, urinary symptoms, dizziness, constipation or fever/chills.  She denies visual changes or RUQ abdominal pain.  Note by other CNM provider (prior to my taking her over): Patient Debra Horn is a 32 y.o. A5W0981 At [redacted]w[redacted]d here with multiple complaints of diarrhea, NV, HA and floating spots that have been on-going. She denies floating spots right now. She states that her floating spots and HA are off and on over the past month.   She denies bleeding; states she has some discharge that she thinks is urine. She denies leaking or gushes or fluid. She states that she has not felt the baby move as much. She says her c-section is scheduled for 39 weeks. She has not been taking baby aspirin. She has a history of pre-eclampsia and c/section due to pre-e (per patient).   Patient last ate last night at 11pm; she last drank at 7pm.    Headache   This is a new problem. The current episode started today. The problem occurs constantly. The pain does not radiate. The quality of the pain is described as aching and dull. Associated symptoms include nausea. Pertinent negatives include no abdominal pain, back pain, blurred vision, fever, numbness, photophobia, seizures or visual change. Nothing aggravates the symptoms. She has tried nothing for the symptoms.  Diarrhea   This is a recurrent problem. The problem has been unchanged. Associated symptoms include headaches. Pertinent negatives include no abdominal pain or fever.     RN Note: Pt reports N/V/D today, x2 diarrhea, unsure how much she has vomitted  Reports headache, blurry  vision, and epigastric pain  Back pain  ???SROM unsure if she is leaking/for how long    Past Medical History: Past Medical History:  Diagnosis Date  . Anemia    low iron  . Asthma   . Complication of anesthesia    bp dropped in middle of last c-section and started with n/v  . GERD (gastroesophageal reflux disease)    during pregnancy  . Headache    "couple days/week" (10/28/2015)  . Hyperthyroidism    not on medication  . PE (pulmonary embolism) 11/2011  . Pregnancy induced hypertension   . Preterm labor     Past obstetric history: OB History  Gravida Para Term Preterm AB Living  3 2   2   2   SAB TAB Ectopic Multiple Live Births          2    # Outcome Date GA Lbr Len/2nd Weight Sex Delivery Anes PTL Lv  3 Current           2 Preterm 08/25/12 [redacted]w[redacted]d  3 lb 10 oz (1.644 kg) F CS-Unspec   LIV     Birth Comments: System Generated. Please review and update pregnancy details.  1 Preterm 06/21/06 [redacted]w[redacted]d  3 lb 7 oz (1.559 kg) M CS-Unspec  Y LIV     Birth Comments: PreE    Past Surgical History: Past Surgical History:  Procedure Laterality Date  . CESAREAN SECTION  2008; 2014  . FRACTURE SURGERY    . ORIF ANKLE FRACTURE Right 10/28/2015  . ORIF ANKLE FRACTURE Right 10/28/2015   Procedure: OPEN  REDUCTION INTERNAL FIXATION (ORIF) RIGHT ANKLE FRACTURE;  Surgeon: Yolonda Kida, MD;  Location: [facility] OR;  Service: Orthopedics;  Laterality: Right;    Family History: Family History  Problem Relation Age of Onset  . Hypertension Maternal Grandfather   . Hypertension Paternal Grandmother   . Cancer Paternal Grandmother   . Hyperlipidemia Paternal Grandmother   . Hypertension Father   . Cancer Sister        skin  . Cancer Maternal Grandmother   . Rheum arthritis Mother   . Lupus Mother   . Hypothyroidism Maternal Aunt   . Lupus Other     Social History: Social History   Tobacco Use  . Smoking status: Never Smoker  . Smokeless tobacco: Never Used  Substance  Use Topics  . Alcohol use: No  . Drug use: No    Allergies:  Allergies  Allergen Reactions  . Banana Anaphylaxis  . Cefixime Rash    Meds:  Medications Prior to Admission  Medication Sig Dispense Refill Last Dose  . albuterol (PROAIR HFA) 108 (90 Base) MCG/ACT inhaler TAKE 2 PUFFS BY MOUTH EVERY 6 HOURS AS NEEDED FOR WHEEZE OR SHORTNESS OF BREATH 8.5 Inhaler 5   . aspirin EC 81 MG tablet Take 1 tablet (81 mg total) by mouth daily. Take after 12 weeks for prevention of preeclampsia later in pregnancy (Patient not taking: Reported on 06/06/2017) 300 tablet 2 Not Taking  . enoxaparin (LOVENOX) 30 MG/0.3ML injection Inject 0.3 mLs (30 mg total) into the skin every 12 (twelve) hours. 30 Syringe 12 Taking  . ferrous sulfate (FERROUSUL) 325 (65 FE) MG tablet Take 1 tablet (325 mg total) by mouth daily with breakfast. 60 tablet 10 Taking  . glycopyrrolate (ROBINUL) 1 MG tablet Take 1 tablet (1 mg total) by mouth 3 (three) times daily. (Patient not taking: Reported on 06/27/2017) 30 tablet 2 Not Taking  . methocarbamol (ROBAXIN-750) 750 MG tablet Take 1 tablet (750 mg total) by mouth every 8 (eight) hours as needed (headache). (Patient not taking: Reported on 06/27/2017) 15 tablet 0 Not Taking  . metoCLOPramide (REGLAN) 10 MG tablet Take 1 tablet (10 mg total) by mouth 3 (three) times daily before meals. (Patient not taking: Reported on 06/27/2017) 60 tablet 2 Not Taking  . pantoprazole (PROTONIX) 40 MG tablet Take 1 tablet (40 mg total) by mouth daily. (Patient not taking: Reported on 07/11/2017) 30 tablet 1 Not Taking  . Prenatal MV & Min w/FA-DHA (PRENATAL ADULT GUMMY/DHA/FA) 0.4-25 MG CHEW Chew 1 tablet by mouth daily. 30 tablet 10 Taking  . ranitidine (ZANTAC) 300 MG tablet TAKE 1 TABLET BY MOUTH EVERYDAY AT BEDTIME (Patient not taking: Reported on 07/11/2017) 90 tablet 1 Not Taking  . tinidazole (TINDAMAX) 500 MG tablet Take 2 tablets (1,000 mg total) by mouth daily with breakfast. 10 tablet 2   .  Vitamin D, Ergocalciferol, (DRISDOL) 50000 units CAPS capsule Take 1 capsule (50,000 Units total) by mouth every 7 (seven) days. (Patient not taking: Reported on 06/27/2017) 30 capsule 2 Not Taking    I have reviewed patient's Past Medical Hx, Surgical Hx, Family Hx, Social Hx, medications and allergies.   ROS:  Review of Systems  Constitutional: Negative for fever.  Eyes: Negative for blurred vision and photophobia.  Gastrointestinal: Positive for diarrhea and nausea. Negative for abdominal pain.  Musculoskeletal: Negative for back pain.  Neurological: Positive for headaches. Negative for seizures and numbness.   Other systems negative  Physical Exam   Patient Vitals for the  past 24 hrs:  BP Temp Temp src Pulse Resp Height Weight  07/17/17 2124 (!) 141/89 - - 60 - - -  07/17/17 2123 (!) 141/89 - - 60 - - -  07/17/17 2036 (!) 139/91 - - 77 - - -  07/17/17 1915 116/80 - - 76 - - -  07/17/17 1900 115/83 - - 86 - - -  07/17/17 1852 123/81 97.9 F (36.6 C) Oral 89 18 - -  07/17/17 1833 - - - - - 5\' 3"  (1.6 m) 213 lb (96.6 kg)   Constitutional: Well-developed, well-nourished female in no acute distress.  Cardiovascular: normal rate and rhythm Respiratory: normal effort, clear to auscultation bilaterally GI: Abd soft, non-tender, gravid appropriate for gestational age.   No rebound or guarding. MS: Extremities nontender, no edema, normal ROM Neurologic: Alert and oriented x 4.  GU: Neg CVAT.  PELVIC EXAM:  Dilation: Closed Effacement (%): Thick Exam by:: Luna Kitchens CNM  FHT:  Baseline 135 , moderate variability, accelerations present, no decelerations Contractions:  Irregular     Labs: B/Positive/-- (02/08 1233) Results for orders placed or performed during the hospital encounter of 07/17/17 (from the past 24 hour(s))  Protein / creatinine ratio, urine     Status: Abnormal   Collection Time: 07/17/17  6:51 PM  Result Value Ref Range   Creatinine, Urine 407.00 mg/dL    Total Protein, Urine 65 mg/dL   Protein Creatinine Ratio 0.16 (H) 0.00 - 0.15 mg/mg[Cre]  Urinalysis, Complete w Microscopic     Status: Abnormal   Collection Time: 07/17/17  6:51 PM  Result Value Ref Range   Color, Urine AMBER (A) YELLOW   APPearance HAZY (A) CLEAR   Specific Gravity, Urine 1.025 1.005 - 1.030   pH 6.0 5.0 - 8.0   Glucose, UA NEGATIVE NEGATIVE mg/dL   Hgb urine dipstick NEGATIVE NEGATIVE   Bilirubin Urine NEGATIVE NEGATIVE   Ketones, ur NEGATIVE NEGATIVE mg/dL   Protein, ur 161 (A) NEGATIVE mg/dL   Nitrite NEGATIVE NEGATIVE   Leukocytes, UA NEGATIVE NEGATIVE   RBC / HPF 0-5 0 - 5 RBC/hpf   WBC, UA 6-10 0 - 5 WBC/hpf   Bacteria, UA RARE (A) NONE SEEN   Squamous Epithelial / LPF 11-20 0 - 5   Mucus PRESENT    Hyaline Casts, UA PRESENT   CBC     Status: Abnormal   Collection Time: 07/17/17  7:00 PM  Result Value Ref Range   WBC 9.2 4.0 - 10.5 K/uL   RBC 3.85 (L) 3.87 - 5.11 MIL/uL   Hemoglobin 9.6 (L) 12.0 - 15.0 g/dL   HCT 09.6 (L) 04.5 - 40.9 %   MCV 80.3 78.0 - 100.0 fL   MCH 24.9 (L) 26.0 - 34.0 pg   MCHC 31.1 30.0 - 36.0 g/dL   RDW 81.1 (H) 91.4 - 78.2 %   Platelets 356 150 - 400 K/uL  Comprehensive metabolic panel     Status: Abnormal   Collection Time: 07/17/17  7:00 PM  Result Value Ref Range   Sodium 134 (L) 135 - 145 mmol/L   Potassium 3.8 3.5 - 5.1 mmol/L   Chloride 105 98 - 111 mmol/L   CO2 17 (L) 22 - 32 mmol/L   Glucose, Bld 67 (L) 70 - 99 mg/dL   BUN 12 6 - 20 mg/dL   Creatinine, Ser 9.56 0.44 - 1.00 mg/dL   Calcium 8.8 (L) 8.9 - 10.3 mg/dL   Total Protein 7.4 6.5 - 8.1  g/dL   Albumin 2.8 (L) 3.5 - 5.0 g/dL   AST 18 15 - 41 U/L   ALT 11 0 - 44 U/L   Alkaline Phosphatase 103 38 - 126 U/L   Total Bilirubin 0.7 0.3 - 1.2 mg/dL   GFR calc non Af Amer >60 >60 mL/min   GFR calc Af Amer >60 >60 mL/min   Anion gap 12 5 - 15    Imaging:    MAU Course/MDM: I have ordered labs and reviewed results.  NST reviewed Consult Dr Adrian BlackwaterStinson with  presentation, exam findings and test results.  Treatments in MAU included Headache cocktail which stopped her headache Vitals:   07/17/17 1915 07/17/17 2036 07/17/17 2123 07/17/17 2124  BP: 116/80 (!) 139/91 (!) 141/89 (!) 141/89  Pulse: 76 77 60 60  Resp:      Temp:      TempSrc:      Weight:      Height:       Later BPs elevated but no elevations 4 hours apart yet Labs normal No edema DTRs brisk with no clonus Doubt severe preeclampsia but may be developing Has appt in am.  Retake BP, if elevated may do C/S early  Did not like the way Benadryl made her feel Discussed it will go away in about 4 hours.  Kept saying she did not like the drowsiness it made her feel Discussed half life is short, will likely improve in a couple of hours .    Assessment: Single IUP at 9927w5d History of severe preeclampsia before with early delivery resulting Now with labile hypertension, no preeclampsia by definition Prior C/S   Plan: Discharge home Labor precautions and fetal kick counts Follow up in Office tomorrow as planned for prenatal visits and recheck If BP elevated tomorrow, will likely get C/S recommended tomorrow per Dr Adrian BlackwaterStinson Encouraged to return here or to other Urgent Care/ED if she develops worsening of symptoms, increase in pain, fever, or other concerning symptoms.   Pt stable at time of discharge.  Wynelle BourgeoisMarie Abigael Mogle CNM, MSN Certified Nurse-Midwife 07/17/2017 9:47 PM

## 2017-07-17 NOTE — MAU Provider Note (Addendum)
Patient Debra Horn is a 32 y.o. [id number] At 4429w4d here with multiple complaints of diarrhea, NV, HA and floating spots that have been on-going. She denies floating spots right now. She states that her floating spots and HA are off and on over the past month.   She denies bleeding; states she has some discharge that she thinks is urine. She denies leaking or gushes or fluid. She states that she has not felt the baby move as much. She says her c-section is scheduled for 39 weeks. She has not been taking baby aspirin. She has a history of pre-eclampsia and c/section due to pre-e (per patient).   Patient last ate last night at 11pm; she last drank at 7pm.  History     CSN: [id number]  Arrival date and time: 07/17/17 1825   None     Chief Complaint  Patient presents with  . Nausea  . Headache  . Blurred Vision  . Diarrhea  . Rupture of Membranes   Emesis   This is a new problem. The current episode started yesterday. The problem occurs 2 to 4 times per day. There has been no fever. Associated symptoms include diarrhea and headaches. She has tried nothing for the symptoms.  Diarrhea   This is a new problem. The current episode started yesterday. The problem occurs 2 to 4 times per day. The problem has been unchanged. Associated symptoms include headaches and vomiting. There are no known risk factors.  Headache   This is a chronic problem. The current episode started 1 to 4 weeks ago. The problem occurs constantly. Associated symptoms include back pain, nausea, a visual change and vomiting. Pertinent negatives include no blurred vision. Associated symptoms comments: Floating spots that come out of nowhere; she rests and then they go away. .    OB History    Gravida  3   Para  2   Term      Preterm  2   AB      Living  2     SAB      TAB      Ectopic      Multiple      Live Births  2           Past Medical History:  Diagnosis Date  . Anemia    low iron  .  Asthma   . Complication of anesthesia    bp dropped in middle of last c-section and started with n/v  . GERD (gastroesophageal reflux disease)    during pregnancy  . Headache    "couple days/week" (10/28/2015)  . Hyperthyroidism    not on medication  . PE (pulmonary embolism) 11/2011  . Pregnancy induced hypertension   . Preterm labor     Past Surgical History:  Procedure Laterality Date  . CESAREAN SECTION  2008; 2014  . FRACTURE SURGERY    . ORIF ANKLE FRACTURE Right 10/28/2015  . ORIF ANKLE FRACTURE Right 10/28/2015   Procedure: OPEN REDUCTION INTERNAL FIXATION (ORIF) RIGHT ANKLE FRACTURE;  Surgeon: Yolonda KidaJason Patrick Rogers, MD;  Location: [facility] OR;  Service: Orthopedics;  Laterality: Right;    Family History  Problem Relation Age of Onset  . Hypertension Maternal Grandfather   . Hypertension Paternal Grandmother   . Cancer Paternal Grandmother   . Hyperlipidemia Paternal Grandmother   . Hypertension Father   . Cancer Sister        skin  . Cancer Maternal Grandmother   . Rheum arthritis  Mother   . Lupus Mother   . Hypothyroidism Maternal Aunt   . Lupus Other     Social History   Tobacco Use  . Smoking status: Never Smoker  . Smokeless tobacco: Never Used  Substance Use Topics  . Alcohol use: No  . Drug use: No    Allergies:  Allergies  Allergen Reactions  . Banana Anaphylaxis  . Cefixime Rash    Medications Prior to Admission  Medication Sig Dispense Refill Last Dose  . albuterol (PROAIR HFA) 108 (90 Base) MCG/ACT inhaler TAKE 2 PUFFS BY MOUTH EVERY 6 HOURS AS NEEDED FOR WHEEZE OR SHORTNESS OF BREATH 8.5 Inhaler 5   . aspirin EC 81 MG tablet Take 1 tablet (81 mg total) by mouth daily. Take after 12 weeks for prevention of preeclampsia later in pregnancy (Patient not taking: Reported on 06/06/2017) 300 tablet 2 Not Taking  . enoxaparin (LOVENOX) 30 MG/0.3ML injection Inject 0.3 mLs (30 mg total) into the skin every 12 (twelve) hours. 30 Syringe 12 Taking  .  ferrous sulfate (FERROUSUL) 325 (65 FE) MG tablet Take 1 tablet (325 mg total) by mouth daily with breakfast. 60 tablet 10 Taking  . glycopyrrolate (ROBINUL) 1 MG tablet Take 1 tablet (1 mg total) by mouth 3 (three) times daily. (Patient not taking: Reported on 06/27/2017) 30 tablet 2 Not Taking  . methocarbamol (ROBAXIN-750) 750 MG tablet Take 1 tablet (750 mg total) by mouth every 8 (eight) hours as needed (headache). (Patient not taking: Reported on 06/27/2017) 15 tablet 0 Not Taking  . metoCLOPramide (REGLAN) 10 MG tablet Take 1 tablet (10 mg total) by mouth 3 (three) times daily before meals. (Patient not taking: Reported on 06/27/2017) 60 tablet 2 Not Taking  . pantoprazole (PROTONIX) 40 MG tablet Take 1 tablet (40 mg total) by mouth daily. (Patient not taking: Reported on 07/11/2017) 30 tablet 1 Not Taking  . Prenatal MV & Min w/FA-DHA (PRENATAL ADULT GUMMY/DHA/FA) 0.4-25 MG CHEW Chew 1 tablet by mouth daily. 30 tablet 10 Taking  . ranitidine (ZANTAC) 300 MG tablet TAKE 1 TABLET BY MOUTH EVERYDAY AT BEDTIME (Patient not taking: Reported on 07/11/2017) 90 tablet 1 Not Taking  . tinidazole (TINDAMAX) 500 MG tablet Take 2 tablets (1,000 mg total) by mouth daily with breakfast. 10 tablet 2   . Vitamin D, Ergocalciferol, (DRISDOL) 50000 units CAPS capsule Take 1 capsule (50,000 Units total) by mouth every 7 (seven) days. (Patient not taking: Reported on 06/27/2017) 30 capsule 2 Not Taking    Review of Systems  HENT: Negative.   Eyes: Negative.  Negative for blurred vision.  Respiratory: Negative.   Cardiovascular: Negative.   Gastrointestinal: Positive for diarrhea, nausea and vomiting.  Musculoskeletal: Positive for back pain.  Neurological: Positive for headaches.  Psychiatric/Behavioral: Negative.    Physical Exam   Blood pressure 116/80, pulse 76, temperature 97.9 F (36.6 C), temperature source Oral, resp. rate 18, height 5\' 3"  (1.6 m), weight 213 lb (96.6 kg), last menstrual period  10/14/2016.  Physical Exam  Constitutional: She is oriented to person, place, and time. She appears well-developed.  HENT:  Head: Normocephalic.  Eyes: Pupils are equal, round, and reactive to light.  Neck: Normal range of motion.  Respiratory: Effort normal.  GI: Soft.  Genitourinary:  Genitourinary Comments: Normal external genitalia; negative pooling. No blood in the vagina; cervix is closed, long and thick.   Musculoskeletal: Normal range of motion.  Neurological: She is alert and oriented to person, place, and time.  Skin:  Skin is warm and dry.    MAU Course  Procedures  MDM IV fluids, Decadron, benadryl and compazine for HA cocktail; did not order fioricet in order to keep patient NPO.   NST: 130 bpm, mod var, present acel, neg acel, irregular contractions.   Patient care endorsed to [facility], CMN at 2015  Assessment and Plan    Charlesetta Garibaldi [facility] 07/17/2017, 7:31 PM    MAU Course/MDM: I have ordered labs and reviewed results.  NST reviewed Consult Dr Adrian Blackwater with presentation, exam findings and test results.  Treatments in MAU included Headache cocktail which stopped her headache Vitals:   07/17/17 1915 07/17/17 2036 07/17/17 2123 07/17/17 2124  BP: 116/80 (!) 139/91 (!) 141/89 (!) 141/89  Pulse: 76 77 60 60  Resp:      Temp:      TempSrc:      Weight:      Height:       Later BPs elevated but no elevations 4 hours apart yet Labs normal No edema DTRs brisk with no clonus Doubt severe preeclampsia but may be developing Has appt in am.  Retake BP, if elevated may do C/S early  Did not like the way Benadryl made her feel Discussed it will go away in about 4 hours.  Kept saying she did not like the drowsiness it made her feel Discussed half life is short, will likely improve in a couple of hours .    Assessment: Single IUP at [redacted]w[redacted]d History of severe preeclampsia before with early delivery resulting Now with labile hypertension, no preeclampsia  by definition Prior C/S   Plan: Discharge home Labor precautions and fetal kick counts Follow up in Office tomorrow as planned for prenatal visits and recheck If BP elevated tomorrow, will likely get C/S recommended tomorrow per Dr Adrian Blackwater Encouraged to return here or to other Urgent Care/ED if she develops worsening of symptoms, increase in pain, fever, or other concerning symptoms.   Pt stable at time of discharge.  Wynelle Bourgeois CNM, MSN Certified Nurse-Midwife 07/17/2017 9:47 PM

## 2017-07-17 NOTE — MAU Note (Signed)
Pt reports N/V/D today, x2 diarrhea, unsure how much she has vomitted  Reports headache, blurry vision, and epigastric pain  Back pain  ???SROM unsure if she is leaking/for how long

## 2017-07-18 ENCOUNTER — Encounter (HOSPITAL_COMMUNITY): Payer: Self-pay

## 2017-07-18 ENCOUNTER — Other Ambulatory Visit: Payer: Self-pay

## 2017-07-18 ENCOUNTER — Encounter: Payer: Medicaid Other | Admitting: Obstetrics

## 2017-07-18 ENCOUNTER — Telehealth (HOSPITAL_COMMUNITY): Payer: Self-pay | Admitting: *Deleted

## 2017-07-18 ENCOUNTER — Ambulatory Visit (HOSPITAL_COMMUNITY)
Admission: RE | Admit: 2017-07-18 | Discharge: 2017-07-18 | Disposition: A | Discharge status: Home / Self Care | Payer: Medicaid Other | Source: Ambulatory Visit | Attending: Obstetrics & Gynecology | Admitting: Obstetrics & Gynecology

## 2017-07-18 ENCOUNTER — Encounter (HOSPITAL_COMMUNITY): Payer: Self-pay | Admitting: *Deleted

## 2017-07-18 ENCOUNTER — Inpatient Hospital Stay (HOSPITAL_COMMUNITY)
Admission: AD | Admit: 2017-07-18 | Discharge: 2017-07-18 | Disposition: A | Discharge status: Home / Self Care | Payer: Medicaid Other | Source: Ambulatory Visit | Attending: Family Medicine | Admitting: Family Medicine

## 2017-07-18 DIAGNOSIS — O9A213 Injury, poisoning and certain other consequences of external causes complicating pregnancy, third trimester: Secondary | ICD-10-CM | POA: Insufficient documentation

## 2017-07-18 DIAGNOSIS — L2489 Irritant contact dermatitis due to other agents: Secondary | ICD-10-CM | POA: Diagnosis not present

## 2017-07-18 DIAGNOSIS — T7840XA Allergy, unspecified, initial encounter: Secondary | ICD-10-CM | POA: Insufficient documentation

## 2017-07-18 DIAGNOSIS — O9989 Other specified diseases and conditions complicating pregnancy, childbirth and the puerperium: Secondary | ICD-10-CM | POA: Diagnosis not present

## 2017-07-18 DIAGNOSIS — Z789 Other specified health status: Secondary | ICD-10-CM | POA: Diagnosis not present

## 2017-07-18 DIAGNOSIS — X58XXXA Exposure to other specified factors, initial encounter: Secondary | ICD-10-CM | POA: Insufficient documentation

## 2017-07-18 DIAGNOSIS — Z7982 Long term (current) use of aspirin: Secondary | ICD-10-CM | POA: Diagnosis not present

## 2017-07-18 DIAGNOSIS — G4489 Other headache syndrome: Secondary | ICD-10-CM | POA: Diagnosis not present

## 2017-07-18 DIAGNOSIS — R0902 Hypoxemia: Secondary | ICD-10-CM | POA: Diagnosis not present

## 2017-07-18 DIAGNOSIS — Z3A37 37 weeks gestation of pregnancy: Secondary | ICD-10-CM | POA: Insufficient documentation

## 2017-07-18 DIAGNOSIS — R070 Pain in throat: Secondary | ICD-10-CM | POA: Diagnosis not present

## 2017-07-18 DIAGNOSIS — R0602 Shortness of breath: Secondary | ICD-10-CM

## 2017-07-18 DIAGNOSIS — O26893 Other specified pregnancy related conditions, third trimester: Secondary | ICD-10-CM | POA: Diagnosis present

## 2017-07-18 LAB — GC/CHLAMYDIA PROBE AMP (~~LOC~~) NOT AT ARMC
Chlamydia: NEGATIVE
NEISSERIA GONORRHEA: NEGATIVE

## 2017-07-18 NOTE — MAU Note (Addendum)
Pt reports to MAU via EMS c/o an "allergic reaction" to meds she received earlier today. EMS reports good breath sounds no signs of distress or hives. EMS gave pt 50mg  of benadryl on the truck. Pt is sleeping in bed. Pt reports feeling "hot" when she is awake. +FM. No LOF or bleeding.

## 2017-07-18 NOTE — Discharge Instructions (Signed)
Shortness of Breath, Adult Shortness of breath is when a person has trouble breathing enough air, or when a person feels like she or he is having trouble breathing in enough air. Shortness of breath could be a sign of medical problem. Follow these instructions at home: Pay attention to any changes in your symptoms. Take these actions to help with your condition:  Do not smoke. Smoking is a common cause of shortness of breath. If you smoke and you need help quitting, ask your health care provider.  Avoid things that can irritate your airways, such as: ? Mold. ? Dust. ? Air pollution. ? Chemical fumes. ? Things that can cause allergy symptoms (allergens), if you have allergies.  Keep your living space clean and free of mold and dust.  Rest as needed. Slowly return to your usual activities.  Take over-the-counter and prescription medicines, including oxygen and inhaled medicines, only as told by your health care provider.  Keep all follow-up visits as told by your health care provider. This is important.  Contact a health care provider if:  Your condition does not improve as soon as expected.  You have a hard time doing your normal activities, even after you rest.  You have new symptoms. Get help right away if:  Your shortness of breath gets worse.  You have shortness of breath when you are resting.  You feel light-headed or you faint.  You have a cough that is not controlled with medicines.  You cough up blood.  You have pain with breathing.  You have pain in your chest, arms, shoulders, or abdomen.  You have a fever.  You cannot walk up stairs or exercise the way that you normally do. This information is not intended to replace advice given to you by your health care provider. Make sure you discuss any questions you have with your health care provider. Document Released: 09/13/2000 Document Revised: 07/10/2015 Document Reviewed: 05/27/2015 Elsevier Interactive Patient  Education  2018 [employer]. Diphenhydramine capsules or tablets What is this medicine? DIPHENHYDRAMINE (dye fen HYE dra meen) is an antihistamine. It is used to treat the symptoms of an allergic reaction. It is also used to treat Parkinson's disease. This medicine is also used to prevent and to treat motion sickness and as a nighttime sleep aid. This medicine may be used for other purposes; ask your health care provider or pharmacist if you have questions. COMMON BRAND NAME(S): Alka-Seltzer Plus Allergy, Aller-G-Time, Banophen, Benadryl Allergy, Benadryl Allergy Dye Free, Benadryl Allergy Kapgel, Benadryl Allergy Ultratab, Diphedryl, Diphenhist, Genahist, PHARBEDRYL, Q-Dryl, Veto Kemps, Valu-Dryl, Vicks ZzzQuil Nightime Sleep-Aid What should I tell my health care provider before I take this medicine? They need to know if you have any of these conditions: -asthma or lung disease -glaucoma -high blood pressure or heart disease -liver disease -pain or difficulty passing urine -prostate trouble -ulcers or other stomach problems -an unusual or allergic reaction to diphenhydramine, other medicines foods, dyes, or preservatives such as sulfites -pregnant or trying to get pregnant -breast-feeding How should I use this medicine? Take this medicine by mouth with a full glass of water. Follow the directions on the prescription label. Take your doses at regular intervals. Do not take your medicine more often than directed. To prevent motion sickness start taking this medicine 30 to 60 minutes before you leave. Talk to your pediatrician regarding the use of this medicine in children. Special care may be needed. Patients over 75 years old may have a stronger reaction and need  a smaller dose. Overdosage: If you think you have taken too much of this medicine contact a poison control center or emergency room at once. NOTE: This medicine is only for you. Do not share this medicine with others. What if I miss  a dose? If you miss a dose, take it as soon as you can. If it is almost time for your next dose, take only that dose. Do not take double or extra doses. What may interact with this medicine? Do not take this medicine with any of the following medications: -MAOIs like Carbex, Eldepryl, Marplan, Nardil, and Parnate This medicine may also interact with the following medications: -alcohol -barbiturates, like phenobarbital -medicines for bladder spasm like oxybutynin, tolterodine -medicines for blood pressure -medicines for depression, anxiety, or psychotic disturbances -medicines for movement abnormalities or Parkinson's disease -medicines for sleep -other medicines for cold, cough or allergy -some medicines for the stomach like chlordiazepoxide, dicyclomine This list may not describe all possible interactions. Give your health care provider a list of all the medicines, herbs, non-prescription drugs, or dietary supplements you use. Also tell them if you smoke, drink alcohol, or use illegal drugs. Some items may interact with your medicine. What should I watch for while using this medicine? Visit your doctor or health care professional for regular check ups. Tell your doctor if your symptoms do not improve or if they get worse. Your mouth may get dry. Chewing sugarless gum or sucking hard candy, and drinking plenty of water may help. Contact your doctor if the problem does not go away or is severe. This medicine may cause dry eyes and blurred vision. If you wear contact lenses you may feel some discomfort. Lubricating drops may help. See your eye doctor if the problem does not go away or is severe. You may get drowsy or dizzy. Do not drive, use machinery, or do anything that needs mental alertness until you know how this medicine affects you. Do not stand or sit up quickly, especially if you are an older patient. This reduces the risk of dizzy or fainting spells. Alcohol may interfere with the effect  of this medicine. Avoid alcoholic drinks. What side effects may I notice from receiving this medicine? Side effects that you should report to your doctor or health care professional as soon as possible: -allergic reactions like skin rash, itching or hives, swelling of the face, lips, or tongue -changes in vision -confused, agitated, nervous -irregular or fast heartbeat -tremor -trouble passing urine -unusual bleeding or bruising -unusually weak or tired Side effects that usually do not require medical attention (report to your doctor or health care professional if they continue or are bothersome): -constipation, diarrhea -drowsy -headache -loss of appetite -stomach upset, vomiting -thick mucous This list may not describe all possible side effects. Call your doctor for medical advice about side effects. You may report side effects to FDA at 1-800-FDA-1088. Where should I keep my medicine? Keep out of the reach of children. Store at room temperature between 15 and 30 degrees C (59 and 86 degrees F). Keep container closed tightly. Throw away any unused medicine after the expiration date. NOTE: This sheet is a summary. It may not cover all possible information. If you have questions about this medicine, talk to your doctor, pharmacist, or health care provider.  2018 Elsevier/Gold Standard (2007-04-08 17:06:22)

## 2017-07-18 NOTE — MAU Provider Note (Signed)
Chief Complaint:  Allergic Reaction   First Provider Initiated Contact with Patient 07/18/17 0138     HPI: Debra Horn is a 32 y.o. [id number] at 65w5dwho presents via EMS to maternity admissions reporting "allergic reaction" to the meds she received for headache treatment   Does not report hives or itching, but rather "shortness of breath" and feeling hot.  Was given Compazine, Benadryl and Decadron when here earlier for headache. These did relieve headache, but patient did not like how they made her feel (could not describe, but apparently somnolence and dizziness were primary symptoms).  She reports good fetal movement, denies LOF, vaginal bleeding, vaginal itching/burning, urinary symptoms, h/a, n/v, diarrhea, constipation or fever/chills.  She denies headache, visual changes or RUQ abdominal pain.  Since she reported allergic reaction and problems breathing, EMS gave her Benadryl 50mg .  (prior doses given around 8pm).    Allergic Reaction  This is a new problem. The current episode started today. The patient was exposed to a prescription drug. The time of exposure was just prior to onset. Associated symptoms include difficulty breathing ("shortness of breath"). Pertinent negatives include no abdominal pain, chest pain, chest pressure, coughing, diarrhea, eye itching, eye redness, eye watering, hyperventilation, itching, rash, stridor, trouble swallowing, vomiting or wheezing. There is no swelling present. Past treatments include nothing.   RN Note: Pt reports to MAU via EMS c/o an "allergic reaction" to meds she received earlier today. EMS reports good breath sounds no signs of distress or hives. EMS gave pt 50mg  of benadryl on the truck. Pt is sleeping in bed. Pt reports feeling "hot" when she is awake. +FM. No LOF or bleeding.    Past Medical History: Past Medical History:  Diagnosis Date  . Anemia    low iron  . Asthma   . Complication of anesthesia    bp dropped in middle of last  c-section and started with n/v  . GERD (gastroesophageal reflux disease)    during pregnancy  . Headache    "couple days/week" (10/28/2015)  . Hyperthyroidism    not on medication  . PE (pulmonary embolism) 11/2011  . Pregnancy induced hypertension   . Preterm labor     Past obstetric history: OB History  Gravida Para Term Preterm AB Living  3 2   2   2   SAB TAB Ectopic Multiple Live Births          2    # Outcome Date GA Lbr Len/2nd Weight Sex Delivery Anes PTL Lv  3 Current           2 Preterm 08/25/12 [redacted]w[redacted]d  3 lb 10 oz (1.644 kg) F CS-Unspec   LIV     Birth Comments: System Generated. Please review and update pregnancy details.  1 Preterm 06/21/06 [redacted]w[redacted]d  3 lb 7 oz (1.559 kg) M CS-Unspec  Y LIV     Birth Comments: PreE    Past Surgical History: Past Surgical History:  Procedure Laterality Date  . CESAREAN SECTION  2008; 2014  . FRACTURE SURGERY    . ORIF ANKLE FRACTURE Right 10/28/2015  . ORIF ANKLE FRACTURE Right 10/28/2015   Procedure: OPEN REDUCTION INTERNAL FIXATION (ORIF) RIGHT ANKLE FRACTURE;  Surgeon: Yolonda Kida, MD;  Location: [facility] OR;  Service: Orthopedics;  Laterality: Right;    Family History: Family History  Problem Relation Age of Onset  . Hypertension Maternal Grandfather   . Hypertension Paternal Grandmother   . Cancer Paternal Grandmother   . Hyperlipidemia Paternal  Grandmother   . Hypertension Father   . Cancer Sister        skin  . Cancer Maternal Grandmother   . Rheum arthritis Mother   . Lupus Mother   . Hypothyroidism Maternal Aunt   . Lupus Other     Social History: Social History   Tobacco Use  . Smoking status: Never Smoker  . Smokeless tobacco: Never Used  Substance Use Topics  . Alcohol use: No  . Drug use: No    Allergies:  Allergies  Allergen Reactions  . Banana Anaphylaxis  . Cefixime Rash    Meds:  Medications Prior to Admission  Medication Sig Dispense Refill Last Dose  . albuterol (PROAIR HFA) 108 (90  Base) MCG/ACT inhaler TAKE 2 PUFFS BY MOUTH EVERY 6 HOURS AS NEEDED FOR WHEEZE OR SHORTNESS OF BREATH 8.5 Inhaler 5   . aspirin EC 81 MG tablet Take 1 tablet (81 mg total) by mouth daily. Take after 12 weeks for prevention of preeclampsia later in pregnancy (Patient not taking: Reported on 06/06/2017) 300 tablet 2 Not Taking  . enoxaparin (LOVENOX) 30 MG/0.3ML injection Inject 0.3 mLs (30 mg total) into the skin every 12 (twelve) hours. 30 Syringe 12 Taking  . ferrous sulfate (FERROUSUL) 325 (65 FE) MG tablet Take 1 tablet (325 mg total) by mouth daily with breakfast. 60 tablet 10 Taking  . glycopyrrolate (ROBINUL) 1 MG tablet Take 1 tablet (1 mg total) by mouth 3 (three) times daily. (Patient not taking: Reported on 06/27/2017) 30 tablet 2 Not Taking  . methocarbamol (ROBAXIN-750) 750 MG tablet Take 1 tablet (750 mg total) by mouth every 8 (eight) hours as needed (headache). (Patient not taking: Reported on 06/27/2017) 15 tablet 0 Not Taking  . metoCLOPramide (REGLAN) 10 MG tablet Take 1 tablet (10 mg total) by mouth 3 (three) times daily before meals. (Patient not taking: Reported on 06/27/2017) 60 tablet 2 Not Taking  . pantoprazole (PROTONIX) 40 MG tablet Take 1 tablet (40 mg total) by mouth daily. (Patient not taking: Reported on 07/11/2017) 30 tablet 1 Not Taking  . Prenatal MV & Min w/FA-DHA (PRENATAL ADULT GUMMY/DHA/FA) 0.4-25 MG CHEW Chew 1 tablet by mouth daily. 30 tablet 10 Taking  . ranitidine (ZANTAC) 300 MG tablet TAKE 1 TABLET BY MOUTH EVERYDAY AT BEDTIME (Patient not taking: Reported on 07/11/2017) 90 tablet 1 Not Taking  . tinidazole (TINDAMAX) 500 MG tablet Take 2 tablets (1,000 mg total) by mouth daily with breakfast. 10 tablet 2   . Vitamin D, Ergocalciferol, (DRISDOL) 50000 units CAPS capsule Take 1 capsule (50,000 Units total) by mouth every 7 (seven) days. (Patient not taking: Reported on 06/27/2017) 30 capsule 2 Not Taking    I have reviewed patient's Past Medical Hx, Surgical Hx,  Family Hx, Social Hx, medications and allergies.   ROS:  Review of Systems  HENT: Negative for trouble swallowing.   Eyes: Negative for redness and itching.  Respiratory: Negative for cough, wheezing and stridor.   Cardiovascular: Negative for chest pain.  Gastrointestinal: Negative for abdominal pain, diarrhea and vomiting.  Skin: Negative for itching and rash.   Other systems negative  Physical Exam   Patient Vitals for the past 24 hrs:  BP Temp Temp src Pulse Resp  07/18/17 0111 129/77 98.3 F (36.8 C) Oral 83 17   Vitals:   07/18/17 0111 07/18/17 0445  BP: 129/77 134/73  Pulse: 83 90  Resp: 17 17  Temp: 98.3 F (36.8 C) 97.8 F (36.6 C)  TempSrc: Oral Oral    Constitutional: Well-developed, well-nourished female in no acute distress.  Does not appear dyspneic.  Relaxed, somnolent.   Skin clear, no erethema or angioedema Cardiovascular: normal rate and rhythm, no ectopy Respiratory: normal effort, clear to auscultation bilaterally  No wheezing.  No tachypnea.  No apparent distress with breathing. Oxygen saturation 97-100% GI: Abd soft, non-tender, gravid appropriate for gestational age.   No rebound or guarding. MS: Extremities nontender, no edema, normal ROM Neurologic: Alert and oriented x 4.  GU: Neg CVAT.  PELVIC EXAM: deferred   FHT:  Baseline 130 , moderate variability, accelerations present, no decelerations Contractions:  Irregular     Labs: Results for orders placed or performed during the hospital encounter of 07/17/17 (from the past 24 hour(s))  Protein / creatinine ratio, urine     Status: Abnormal   Collection Time: 07/17/17  6:51 PM  Result Value Ref Range   Creatinine, Urine 407.00 mg/dL   Total Protein, Urine 65 mg/dL   Protein Creatinine Ratio 0.16 (H) 0.00 - 0.15 mg/mg[Cre]  Urinalysis, Complete w Microscopic     Status: Abnormal   Collection Time: 07/17/17  6:51 PM  Result Value Ref Range   Color, Urine AMBER (A) YELLOW   APPearance HAZY  (A) CLEAR   Specific Gravity, Urine 1.025 1.005 - 1.030   pH 6.0 5.0 - 8.0   Glucose, UA NEGATIVE NEGATIVE mg/dL   Hgb urine dipstick NEGATIVE NEGATIVE   Bilirubin Urine NEGATIVE NEGATIVE   Ketones, ur NEGATIVE NEGATIVE mg/dL   Protein, ur 161 (A) NEGATIVE mg/dL   Nitrite NEGATIVE NEGATIVE   Leukocytes, UA NEGATIVE NEGATIVE   RBC / HPF 0-5 0 - 5 RBC/hpf   WBC, UA 6-10 0 - 5 WBC/hpf   Bacteria, UA RARE (A) NONE SEEN   Squamous Epithelial / LPF 11-20 0 - 5   Mucus PRESENT    Hyaline Casts, UA PRESENT   CBC     Status: Abnormal   Collection Time: 07/17/17  7:00 PM  Result Value Ref Range   WBC 9.2 4.0 - 10.5 K/uL   RBC 3.85 (L) 3.87 - 5.11 MIL/uL   Hemoglobin 9.6 (L) 12.0 - 15.0 g/dL   HCT 09.6 (L) 04.5 - 40.9 %   MCV 80.3 78.0 - 100.0 fL   MCH 24.9 (L) 26.0 - 34.0 pg   MCHC 31.1 30.0 - 36.0 g/dL   RDW 81.1 (H) 91.4 - 78.2 %   Platelets 356 150 - 400 K/uL  Comprehensive metabolic panel     Status: Abnormal   Collection Time: 07/17/17  7:00 PM  Result Value Ref Range   Sodium 134 (L) 135 - 145 mmol/L   Potassium 3.8 3.5 - 5.1 mmol/L   Chloride 105 98 - 111 mmol/L   CO2 17 (L) 22 - 32 mmol/L   Glucose, Bld 67 (L) 70 - 99 mg/dL   BUN 12 6 - 20 mg/dL   Creatinine, Ser 9.56 0.44 - 1.00 mg/dL   Calcium 8.8 (L) 8.9 - 10.3 mg/dL   Total Protein 7.4 6.5 - 8.1 g/dL   Albumin 2.8 (L) 3.5 - 5.0 g/dL   AST 18 15 - 41 U/L   ALT 11 0 - 44 U/L   Alkaline Phosphatase 103 38 - 126 U/L   Total Bilirubin 0.7 0.3 - 1.2 mg/dL   GFR calc non Af Amer >60 >60 mL/min   GFR calc Af Amer >60 >60 mL/min   Anion gap 12 5 -  15   B/Positive/-- (02/08 1233)  Imaging:  12lead EKG done which showed normal sinus rhythm  MAU Course/MDM: I have reviewed results of labs done earlier.  NST reviewed and is reactive 12 lead EKG ordered due to pt report of shortness of breath and pressure on chest.  Does not appear dyspneic or tachypneic.  Does not appear uncomfortable.  Somnolent but easily arouseable.  Walked to BR independently .   Treatments in MAU included Pulse oximetry for 3+ hours.  EFM, observation, prolonged.   Suspect the combination of medications (Benadryl and Compazine) caused side effects she found intolerable.  This was compounded by EMS administering another dose of Benadryl en route.   Assessment: Single intrauterine pregnancy at 3649w5d Labile hypertension, no hypertension this visit. Intolerance of medication, with no apparent allergic reaction  Plan: Discharge home Supportive care Has US appt this morning. This is followed by office appt.  Labor precautions and fetal kick counts Follow up in Office for prenatal visits and recheck of status and BP  Encouraged to return here or to other Urgent Care/ED if she develops worsening of symptoms, increase in pain, fever, or other concerning symptoms.   Pt stable at time of discharge.  Wynelle BourgeoisMarie Williams CNM, MSN Certified Nurse-Midwife 07/18/2017 2:36 AM

## 2017-07-18 NOTE — Telephone Encounter (Signed)
During pat phone call pt stated she had stopped her lovenox several days ago.  Dr Ashok PallWouk notified and pt instructed to restart lovenox today.  Pt voiced understanding.  Unhappy with visits to MAU on 7/16 and 7/17.  Questions regarding medications during those visits.  Encourage pt to call [facility] to review those visits and answer questions.

## 2017-07-26 ENCOUNTER — Telehealth: Payer: Self-pay | Admitting: *Deleted

## 2017-07-26 ENCOUNTER — Encounter (HOSPITAL_COMMUNITY)
Admission: RE | Admit: 2017-07-26 | Discharge: 2017-07-26 | Disposition: A | Discharge status: Home / Self Care | Payer: Medicaid Other | Source: Ambulatory Visit | Attending: Family Medicine | Admitting: Family Medicine

## 2017-07-26 HISTORY — DX: Other specified postprocedural states: R11.2

## 2017-07-26 HISTORY — DX: Other specified postprocedural states: Z98.890

## 2017-07-26 HISTORY — DX: Nausea with vomiting, unspecified: R11.2

## 2017-07-26 LAB — CBC
HEMATOCRIT: 31.7 % — AB (ref 36.0–46.0)
Hemoglobin: 9.8 g/dL — ABNORMAL LOW (ref 12.0–15.0)
MCH: 24.5 pg — ABNORMAL LOW (ref 26.0–34.0)
MCHC: 30.9 g/dL (ref 30.0–36.0)
MCV: 79.3 fL (ref 78.0–100.0)
Platelets: 341 10*3/uL (ref 150–400)
RBC: 4 MIL/uL (ref 3.87–5.11)
RDW: 17 % — ABNORMAL HIGH (ref 11.5–15.5)
WBC: 8.9 10*3/uL (ref 4.0–10.5)

## 2017-07-26 LAB — TYPE AND SCREEN
ABO/RH(D): B POS
ANTIBODY SCREEN: NEGATIVE

## 2017-07-26 NOTE — H&P (Signed)
Debra Horn is an 32 y.o. [id number] 5166w0d female.   Chief Complaint: Previous C-section x 2 HPI: Prior C-section x 2. For elective repeat with undesired fertility.  Past Medical History:  Diagnosis Date  . Anemia    low iron  . Asthma   . Complication of anesthesia    bp dropped in middle of last c-section and started with n/v  . GERD (gastroesophageal reflux disease)    during pregnancy  . Headache    "couple days/week" (10/28/2015)  . Hyperthyroidism    not on medication  . PE (pulmonary embolism) 11/2011  . PONV (postoperative nausea and vomiting)   . Pregnancy induced hypertension   . Preterm labor     Past Surgical History:  Procedure Laterality Date  . CESAREAN SECTION  2008; 2014  . FRACTURE SURGERY    . ORIF ANKLE FRACTURE Right 10/28/2015  . ORIF ANKLE FRACTURE Right 10/28/2015   Procedure: OPEN REDUCTION INTERNAL FIXATION (ORIF) RIGHT ANKLE FRACTURE;  Surgeon: Yolonda KidaJason Patrick Rogers, MD;  [facility];  Service: Orthopedics;  Laterality: Right;    Family History  Problem Relation Age of Onset  . Hypertension Maternal Grandfather   . Hypertension Paternal Grandmother   . Cancer Paternal Grandmother   . Hyperlipidemia Paternal Grandmother   . Hypertension Father   . Cancer Sister        skin  . Cancer Maternal Grandmother   . Rheum arthritis Mother   . Lupus Mother   . Hypothyroidism Maternal Aunt   . Lupus Other    Social History:  reports that she has never smoked. She has never used smokeless tobacco. She reports that she does not drink alcohol or use drugs.    Allergies  Allergen Reactions  . Banana Anaphylaxis  . Cefixime Rash    No medications prior to admission.     A comprehensive review of systems was negative.  Last menstrual period 10/14/2016. General appearance: alert, cooperative and appears stated age Head: Normocephalic, without obvious abnormality, atraumatic Neck: supple, symmetrical, trachea midline Lungs: normal  effort Heart: regular rate and rhythm Abdomen: gravid, Non-tender Extremities: Homans sign is negative, no sign of DVT Skin: Skin color, texture, turgor normal. No rashes or lesions Neurologic: Grossly normal   Lab Results  Component Value Date   WBC 8.9 07/26/2017   HGB 9.8 (L) 07/26/2017   HCT 31.7 (L) 07/26/2017   MCV 79.3 07/26/2017   PLT 341 07/26/2017         ABO, Rh: --/--/B POS (07/25 1000)  Antibody: NEG (07/25 1000)  Rubella: 2.06 (02/08 1233)  RPR: Non Reactive (02/08 1233)  HBsAg: Negative (02/08 1233)  HIV: Non Reactive (02/08 1233)  GBS: Negative (07/10 1157)     Assessment/Plan Principal Problem:   Previous cesarean section Active Problems:   Hx pulmonary embolism  For RCS and BTL  Risks include but are not limited to bleeding, infection, injury to surrounding structures, including bowel, bladder and ureters, blood clots, and death.  Likelihood of success is high. Patient counseled, r.e. Risks benefits of BTL, including permanency of procedure, risk of failure(1:100), increased risk of ectopic.  Patient verbalized understanding and desires to proceed   Debra Horn 07/26/2017, 5:06 PM

## 2017-07-26 NOTE — Telephone Encounter (Signed)
Attempted to call patient to reschedule Ob visit and left a vmail, patient is scheduled for Repeat C Section on 07/27/2017.Marland Kitchen.Marland Kitchen..Marland Kitchen

## 2017-07-26 NOTE — Patient Instructions (Signed)
Debra Horn  07/26/2017   Your procedure is scheduled on:  07/27/2017  [facility] at 0745 AM.  Pick up the phone at the desk and dial [phone]  Call this number if you have problems the morning of [phone]  Remember:   Do not eat food:(After Midnight) Desps de medianoche.  Do not drink clear liquids: (After Midnight) Desps de medianoche.  Take these medicines the morning of surgery with A SIP OF WATER: no medications.  DO NOT TAKE ANY LOVENOX THE NIGHT BEFORE OR THE MORNING OF SURGERY.   Do not wear jewelry, make-up or nail polish.  Do not wear lotions, powders, or perfumes. Do not wear deodorant.  Do not shave 48 hours prior to surgery.  Do not bring valuables to the hospital.  [facility] is not   responsible for any belongings or valuables brought to the hospital.  Contacts, dentures or bridgework may not be worn into surgery.  Leave suitcase in the car. After surgery it may be brought to your room.  For patients admitted to the hospital, checkout time is 11:00 AM the day of              discharge.    N/A   Please read over the following fact sheets that you were given:   Surgical Site Infection Prevention

## 2017-07-27 ENCOUNTER — Inpatient Hospital Stay (HOSPITAL_COMMUNITY): Payer: Medicaid Other | Admitting: Anesthesiology

## 2017-07-27 ENCOUNTER — Encounter (HOSPITAL_COMMUNITY): Payer: Self-pay | Admitting: *Deleted

## 2017-07-27 ENCOUNTER — Inpatient Hospital Stay (HOSPITAL_COMMUNITY)
Admission: AD | Admit: 2017-07-27 | Discharge: 2017-07-30 | DRG: 785 | Disposition: A | Discharge status: Home / Self Care | Payer: Medicaid Other | Attending: Family Medicine | Admitting: Family Medicine

## 2017-07-27 ENCOUNTER — Encounter (HOSPITAL_COMMUNITY)
Admission: AD | Disposition: A | Discharge status: Home / Self Care | Payer: Self-pay | Source: Home / Self Care | Attending: Family Medicine

## 2017-07-27 DIAGNOSIS — O34211 Maternal care for low transverse scar from previous cesarean delivery: Principal | ICD-10-CM | POA: Diagnosis present

## 2017-07-27 DIAGNOSIS — Z3A39 39 weeks gestation of pregnancy: Secondary | ICD-10-CM | POA: Diagnosis not present

## 2017-07-27 DIAGNOSIS — J45909 Unspecified asthma, uncomplicated: Secondary | ICD-10-CM | POA: Diagnosis not present

## 2017-07-27 DIAGNOSIS — D649 Anemia, unspecified: Secondary | ICD-10-CM | POA: Diagnosis not present

## 2017-07-27 DIAGNOSIS — O34219 Maternal care for unspecified type scar from previous cesarean delivery: Secondary | ICD-10-CM | POA: Diagnosis not present

## 2017-07-27 DIAGNOSIS — O9902 Anemia complicating childbirth: Secondary | ICD-10-CM | POA: Diagnosis present

## 2017-07-27 DIAGNOSIS — Z86711 Personal history of pulmonary embolism: Secondary | ICD-10-CM

## 2017-07-27 DIAGNOSIS — O99214 Obesity complicating childbirth: Secondary | ICD-10-CM | POA: Diagnosis not present

## 2017-07-27 DIAGNOSIS — O365930 Maternal care for other known or suspected poor fetal growth, third trimester, not applicable or unspecified: Secondary | ICD-10-CM | POA: Diagnosis present

## 2017-07-27 DIAGNOSIS — Z302 Encounter for sterilization: Secondary | ICD-10-CM

## 2017-07-27 DIAGNOSIS — O9952 Diseases of the respiratory system complicating childbirth: Secondary | ICD-10-CM | POA: Diagnosis not present

## 2017-07-27 DIAGNOSIS — O0993 Supervision of high risk pregnancy, unspecified, third trimester: Secondary | ICD-10-CM

## 2017-07-27 DIAGNOSIS — Z98891 History of uterine scar from previous surgery: Secondary | ICD-10-CM

## 2017-07-27 HISTORY — PX: TUBAL LIGATION: SHX77

## 2017-07-27 LAB — CBC
HCT: 31.3 % — ABNORMAL LOW (ref 36.0–46.0)
Hemoglobin: 9.6 g/dL — ABNORMAL LOW (ref 12.0–15.0)
MCH: 24.4 pg — ABNORMAL LOW (ref 26.0–34.0)
MCHC: 30.7 g/dL (ref 30.0–36.0)
MCV: 79.4 fL (ref 78.0–100.0)
Platelets: 330 10*3/uL (ref 150–400)
RBC: 3.94 MIL/uL (ref 3.87–5.11)
RDW: 17.2 % — AB (ref 11.5–15.5)
WBC: 9 10*3/uL (ref 4.0–10.5)

## 2017-07-27 LAB — COMPREHENSIVE METABOLIC PANEL
ALBUMIN: 2.9 g/dL — AB (ref 3.5–5.0)
ALT: 12 U/L (ref 0–44)
ANION GAP: 9 (ref 5–15)
AST: 17 U/L (ref 15–41)
Alkaline Phosphatase: 110 U/L (ref 38–126)
BUN: 13 mg/dL (ref 6–20)
CHLORIDE: 108 mmol/L (ref 98–111)
CO2: 18 mmol/L — AB (ref 22–32)
Calcium: 8.8 mg/dL — ABNORMAL LOW (ref 8.9–10.3)
Creatinine, Ser: 0.72 mg/dL (ref 0.44–1.00)
GFR calc Af Amer: >60 mL/min (ref >60)
GFR calc non Af Amer: >60 mL/min (ref >60)
GLUCOSE: 78 mg/dL (ref 70–99)
POTASSIUM: 3.9 mmol/L (ref 3.5–5.1)
SODIUM: 135 mmol/L (ref 135–145)
Total Bilirubin: 0.3 mg/dL (ref 0.3–1.2)
Total Protein: 7.2 g/dL (ref 6.5–8.1)

## 2017-07-27 LAB — RPR: RPR: NONREACTIVE

## 2017-07-27 SURGERY — Surgical Case
Anesthesia: Monitor Anesthesia Care | Site: Abdomen

## 2017-07-27 MED ORDER — FENTANYL CITRATE (PF) 100 MCG/2ML IJ SOLN
INTRAMUSCULAR | Status: AC
  Filled 2017-07-27: qty 2

## 2017-07-27 MED ORDER — MEPERIDINE HCL 25 MG/ML IJ SOLN
INTRAMUSCULAR | Status: DC | PRN
  Administered 2017-07-27: 12.5 mg via INTRAVENOUS

## 2017-07-27 MED ORDER — ACETAMINOPHEN 325 MG PO TABS
650.0000 mg | ORAL_TABLET | ORAL | Status: DC | PRN
  Administered 2017-07-27: 650 mg via ORAL
  Filled 2017-07-27: qty 2

## 2017-07-27 MED ORDER — HYDRALAZINE HCL 20 MG/ML IJ SOLN
INTRAMUSCULAR | Status: AC
  Filled 2017-07-27: qty 1

## 2017-07-27 MED ORDER — SOD CITRATE-CITRIC ACID 500-334 MG/5ML PO SOLN
30.0000 mL | ORAL | Status: AC
  Administered 2017-07-27: 30 mL via ORAL
  Filled 2017-07-27: qty 15

## 2017-07-27 MED ORDER — MEPERIDINE HCL 25 MG/ML IJ SOLN: 6.2500 mg | INTRAMUSCULAR | Status: DC | PRN

## 2017-07-27 MED ORDER — OXYCODONE HCL 5 MG PO TABS
5.0000 mg | ORAL_TABLET | ORAL | Status: DC | PRN
  Administered 2017-07-29: 5 mg via ORAL
  Filled 2017-07-27: qty 1

## 2017-07-27 MED ORDER — SODIUM CHLORIDE 0.9 % IR SOLN
Status: DC | PRN
  Administered 2017-07-27: 1

## 2017-07-27 MED ORDER — PHENYLEPHRINE 8 MG IN D5W 100 ML (0.08MG/ML) PREMIX OPTIME
INJECTION | INTRAVENOUS | Status: AC
  Filled 2017-07-27: qty 100

## 2017-07-27 MED ORDER — COMPLETENATE 29-1 MG PO CHEW
1.0000 | CHEWABLE_TABLET | Freq: Every day | ORAL | Status: DC
  Administered 2017-07-28 – 2017-07-30 (×3): 1 via ORAL
  Filled 2017-07-27 (×5): qty 1

## 2017-07-27 MED ORDER — PHENYLEPHRINE 8 MG IN D5W 100 ML (0.08MG/ML) PREMIX OPTIME
INJECTION | INTRAVENOUS | Status: DC | PRN
  Administered 2017-07-27: 30 ug/min via INTRAVENOUS

## 2017-07-27 MED ORDER — NALOXONE HCL 0.4 MG/ML IJ SOLN: 0.4000 mg | INTRAMUSCULAR | Status: DC | PRN

## 2017-07-27 MED ORDER — DIBUCAINE 1 % RE OINT: 1.0000 "application " | TOPICAL_OINTMENT | RECTAL | Status: DC | PRN

## 2017-07-27 MED ORDER — WITCH HAZEL-GLYCERIN EX PADS: 1.0000 "application " | MEDICATED_PAD | CUTANEOUS | Status: DC | PRN

## 2017-07-27 MED ORDER — DEXAMETHASONE SODIUM PHOSPHATE 4 MG/ML IJ SOLN
INTRAMUSCULAR | Status: DC | PRN
  Administered 2017-07-27: 4 mg via INTRAVENOUS

## 2017-07-27 MED ORDER — BUPIVACAINE IN DEXTROSE 0.75-8.25 % IT SOLN
INTRATHECAL | Status: DC | PRN
  Administered 2017-07-27: 1.6 mL via INTRATHECAL

## 2017-07-27 MED ORDER — TETANUS-DIPHTH-ACELL PERTUSSIS 5-2.5-18.5 LF-MCG/0.5 IM SUSP
0.5000 mL | Freq: Once | INTRAMUSCULAR | Status: AC
  Administered 2017-07-29: 0.5 mL via INTRAMUSCULAR
  Filled 2017-07-27: qty 0.5

## 2017-07-27 MED ORDER — OXYCODONE HCL 5 MG PO TABS
10.0000 mg | ORAL_TABLET | ORAL | Status: DC | PRN
  Administered 2017-07-28 – 2017-07-30 (×5): 10 mg via ORAL
  Filled 2017-07-27 (×6): qty 2

## 2017-07-27 MED ORDER — ONDANSETRON HCL 4 MG/2ML IJ SOLN
INTRAMUSCULAR | Status: DC | PRN
  Administered 2017-07-27: 4 mg via INTRAVENOUS

## 2017-07-27 MED ORDER — MEPERIDINE HCL 25 MG/ML IJ SOLN
INTRAMUSCULAR | Status: AC
  Filled 2017-07-27: qty 1

## 2017-07-27 MED ORDER — OXYTOCIN 10 UNIT/ML IJ SOLN
INTRAMUSCULAR | Status: DC | PRN
  Administered 2017-07-27: 40 [IU] via INTRAVENOUS

## 2017-07-27 MED ORDER — LACTATED RINGERS IV SOLN
INTRAVENOUS | Status: DC
  Administered 2017-07-27 (×2): via INTRAVENOUS

## 2017-07-27 MED ORDER — SCOPOLAMINE 1 MG/3DAYS TD PT72
1.0000 | MEDICATED_PATCH | Freq: Once | TRANSDERMAL | Status: AC
  Administered 2017-07-27: 1.5 mg via TRANSDERMAL
  Filled 2017-07-27: qty 1

## 2017-07-27 MED ORDER — CEFAZOLIN SODIUM-DEXTROSE 2-4 GM/100ML-% IV SOLN
2.0000 g | INTRAVENOUS | Status: AC
  Administered 2017-07-27: 2 g via INTRAVENOUS
  Filled 2017-07-27: qty 100

## 2017-07-27 MED ORDER — PRENATAL MULTIVITAMIN CH: 1.0000 | ORAL_TABLET | Freq: Every day | ORAL | Status: DC

## 2017-07-27 MED ORDER — SENNOSIDES-DOCUSATE SODIUM 8.6-50 MG PO TABS
2.0000 | ORAL_TABLET | ORAL | Status: DC
  Administered 2017-07-27 – 2017-07-29 (×3): 2 via ORAL
  Filled 2017-07-27 (×3): qty 2

## 2017-07-27 MED ORDER — SIMETHICONE 80 MG PO CHEW
80.0000 mg | CHEWABLE_TABLET | ORAL | Status: DC
  Administered 2017-07-27 – 2017-07-29 (×3): 80 mg via ORAL
  Filled 2017-07-27 (×3): qty 1

## 2017-07-27 MED ORDER — IBUPROFEN 600 MG PO TABS
600.0000 mg | ORAL_TABLET | Freq: Four times a day (QID) | ORAL | Status: DC
  Administered 2017-07-27: 600 mg via ORAL
  Filled 2017-07-27: qty 1

## 2017-07-27 MED ORDER — COCONUT OIL OIL: 1.0000 "application " | TOPICAL_OIL | Status: DC | PRN

## 2017-07-27 MED ORDER — SIMETHICONE 80 MG PO CHEW
80.0000 mg | CHEWABLE_TABLET | ORAL | Status: DC | PRN
  Administered 2017-07-30 (×2): 80 mg via ORAL

## 2017-07-27 MED ORDER — DIPHENHYDRAMINE HCL 25 MG PO CAPS
25.0000 mg | ORAL_CAPSULE | Freq: Four times a day (QID) | ORAL | Status: DC | PRN
  Administered 2017-07-28: 25 mg via ORAL
  Filled 2017-07-27: qty 1

## 2017-07-27 MED ORDER — PNEUMOCOCCAL VAC POLYVALENT 25 MCG/0.5ML IJ INJ
0.5000 mL | INJECTION | INTRAMUSCULAR | Status: DC
  Filled 2017-07-27: qty 0.5

## 2017-07-27 MED ORDER — NALBUPHINE HCL 10 MG/ML IJ SOLN: 5.0000 mg | Freq: Once | INTRAMUSCULAR | Status: DC | PRN

## 2017-07-27 MED ORDER — FENTANYL CITRATE (PF) 100 MCG/2ML IJ SOLN
INTRAMUSCULAR | Status: DC | PRN
  Administered 2017-07-27: 10 ug via INTRATHECAL

## 2017-07-27 MED ORDER — OXYTOCIN 40 UNITS IN LACTATED RINGERS INFUSION - SIMPLE MED: 2.5000 [IU]/h | INTRAVENOUS | Status: AC

## 2017-07-27 MED ORDER — HYDRALAZINE HCL 20 MG/ML IJ SOLN
5.0000 mg | INTRAMUSCULAR | Status: DC | PRN
  Administered 2017-07-27 (×4): 5 mg via INTRAVENOUS

## 2017-07-27 MED ORDER — BUPIVACAINE HCL (PF) 0.25 % IJ SOLN
INTRAMUSCULAR | Status: DC | PRN
  Administered 2017-07-27: 30 mL

## 2017-07-27 MED ORDER — ZOLPIDEM TARTRATE 5 MG PO TABS: 5.0000 mg | ORAL_TABLET | Freq: Every evening | ORAL | Status: DC | PRN

## 2017-07-27 MED ORDER — MENTHOL 3 MG MT LOZG: 1.0000 | LOZENGE | OROMUCOSAL | Status: DC | PRN

## 2017-07-27 MED ORDER — SODIUM CHLORIDE 0.9% FLUSH: 3.0000 mL | INTRAVENOUS | Status: DC | PRN

## 2017-07-27 MED ORDER — NALBUPHINE HCL 10 MG/ML IJ SOLN: 5.0000 mg | INTRAMUSCULAR | Status: DC | PRN

## 2017-07-27 MED ORDER — NALOXONE HCL 4 MG/10ML IJ SOLN: 1.0000 ug/kg/h | INTRAVENOUS | Status: DC | PRN

## 2017-07-27 MED ORDER — ONDANSETRON HCL 4 MG/2ML IJ SOLN
INTRAMUSCULAR | Status: AC
  Filled 2017-07-27: qty 2

## 2017-07-27 MED ORDER — LACTATED RINGERS IV SOLN
INTRAVENOUS | Status: DC
  Administered 2017-07-27 – 2017-07-28 (×2): via INTRAVENOUS

## 2017-07-27 MED ORDER — ALBUTEROL SULFATE HFA 108 (90 BASE) MCG/ACT IN AERS: 2.0000 | INHALATION_SPRAY | RESPIRATORY_TRACT | Status: DC | PRN

## 2017-07-27 MED ORDER — MORPHINE SULFATE (PF) 0.5 MG/ML IJ SOLN
INTRAMUSCULAR | Status: DC | PRN
  Administered 2017-07-27: .2 mg via INTRATHECAL

## 2017-07-27 MED ORDER — BUPIVACAINE HCL (PF) 0.25 % IJ SOLN
INTRAMUSCULAR | Status: AC
  Filled 2017-07-27: qty 30

## 2017-07-27 MED ORDER — DIPHENHYDRAMINE HCL 25 MG PO CAPS
25.0000 mg | ORAL_CAPSULE | ORAL | Status: DC | PRN
  Administered 2017-07-28: 25 mg via ORAL
  Filled 2017-07-27 (×2): qty 1

## 2017-07-27 MED ORDER — BUPIVACAINE IN DEXTROSE 0.75-8.25 % IT SOLN
INTRATHECAL | Status: AC
  Filled 2017-07-27: qty 2

## 2017-07-27 MED ORDER — SIMETHICONE 80 MG PO CHEW
80.0000 mg | CHEWABLE_TABLET | Freq: Three times a day (TID) | ORAL | Status: DC
  Administered 2017-07-28 – 2017-07-29 (×6): 80 mg via ORAL
  Filled 2017-07-27 (×8): qty 1

## 2017-07-27 MED ORDER — DEXAMETHASONE SODIUM PHOSPHATE 4 MG/ML IJ SOLN
INTRAMUSCULAR | Status: AC
Start: 2017-07-27 — End: ?
  Filled 2017-07-27: qty 1

## 2017-07-27 MED ORDER — DIPHENHYDRAMINE HCL 50 MG/ML IJ SOLN
12.5000 mg | INTRAMUSCULAR | Status: DC | PRN
  Administered 2017-07-27: 12.5 mg via INTRAVENOUS

## 2017-07-27 MED ORDER — ACETAMINOPHEN 160 MG/5ML PO SOLN
650.0000 mg | ORAL | Status: DC | PRN
  Administered 2017-07-29 (×2): 650 mg via ORAL
  Filled 2017-07-27 (×2): qty 20.3

## 2017-07-27 MED ORDER — DIPHENHYDRAMINE HCL 50 MG/ML IJ SOLN
INTRAMUSCULAR | Status: AC
  Filled 2017-07-27: qty 1

## 2017-07-27 MED ORDER — OXYTOCIN 10 UNIT/ML IJ SOLN
INTRAMUSCULAR | Status: AC
Start: 2017-07-27 — End: ?
  Filled 2017-07-27: qty 4

## 2017-07-27 MED ORDER — PROMETHAZINE HCL 25 MG/ML IJ SOLN: 6.2500 mg | INTRAMUSCULAR | Status: DC | PRN

## 2017-07-27 MED ORDER — LACTATED RINGERS IV SOLN
INTRAVENOUS | Status: DC
  Administered 2017-07-27: 10:00:00 via INTRAVENOUS

## 2017-07-27 MED ORDER — ALBUTEROL SULFATE (2.5 MG/3ML) 0.083% IN NEBU: 2.5000 mg | INHALATION_SOLUTION | RESPIRATORY_TRACT | Status: DC | PRN

## 2017-07-27 MED ORDER — IBUPROFEN 100 MG/5ML PO SUSP
600.0000 mg | Freq: Four times a day (QID) | ORAL | Status: DC
  Administered 2017-07-28 – 2017-07-30 (×11): 600 mg via ORAL
  Filled 2017-07-27 (×15): qty 30

## 2017-07-27 MED ORDER — ONDANSETRON HCL 4 MG/2ML IJ SOLN: 4.0000 mg | Freq: Three times a day (TID) | INTRAMUSCULAR | Status: DC | PRN

## 2017-07-27 MED ORDER — MORPHINE SULFATE (PF) 0.5 MG/ML IJ SOLN
INTRAMUSCULAR | Status: AC
  Filled 2017-07-27: qty 10

## 2017-07-27 MED ORDER — ENOXAPARIN SODIUM 30 MG/0.3ML ~~LOC~~ SOLN
30.0000 mg | Freq: Two times a day (BID) | SUBCUTANEOUS | Status: DC
  Administered 2017-07-28 – 2017-07-30 (×5): 30 mg via SUBCUTANEOUS
  Filled 2017-07-27 (×9): qty 0.3

## 2017-07-27 MED ORDER — FENTANYL CITRATE (PF) 100 MCG/2ML IJ SOLN
25.0000 ug | INTRAMUSCULAR | Status: DC | PRN
  Administered 2017-07-27: 50 ug via INTRAVENOUS
  Administered 2017-07-27 (×2): 25 ug via INTRAVENOUS

## 2017-07-27 MED ORDER — FERROUS SULFATE 325 (65 FE) MG PO TABS
325.0000 mg | ORAL_TABLET | Freq: Two times a day (BID) | ORAL | Status: DC
  Administered 2017-07-28 – 2017-07-30 (×5): 325 mg via ORAL
  Filled 2017-07-27 (×6): qty 1

## 2017-07-27 SURGICAL SUPPLY — 34 items
APL SKNCLS STERI-STRIP NONHPOA (GAUZE/BANDAGES/DRESSINGS) ×2
BENZOIN TINCTURE PRP APPL 2/3 (GAUZE/BANDAGES/DRESSINGS) ×3 IMPLANT
CHLORAPREP W/TINT 26ML (MISCELLANEOUS) ×3 IMPLANT
CLAMP CORD UMBIL (MISCELLANEOUS) IMPLANT
CLIP FILSHIE TUBAL LIGA STRL (Clip) ×1 IMPLANT
CLOSURE STERI STRIP 1/2 X4 (GAUZE/BANDAGES/DRESSINGS) ×1 IMPLANT
CLOTH BEACON ORANGE TIMEOUT ST (SAFETY) ×3 IMPLANT
DRSG OPSITE POSTOP 4X10 (GAUZE/BANDAGES/DRESSINGS) ×3 IMPLANT
ELECT REM PT RETURN 9FT ADLT (ELECTROSURGICAL) ×3
ELECTRODE REM PT RTRN 9FT ADLT (ELECTROSURGICAL) ×2 IMPLANT
EXTRACTOR VACUUM M CUP 4 TUBE (SUCTIONS) IMPLANT
GAUZE SPONGE 4X4 12PLY STRL LF (GAUZE/BANDAGES/DRESSINGS) ×2 IMPLANT
GLOVE BIOGEL PI IND STRL 7.0 (GLOVE) ×4 IMPLANT
GLOVE BIOGEL PI INDICATOR 7.0 (GLOVE) ×2
GLOVE ECLIPSE 7.0 STRL STRAW (GLOVE) ×6 IMPLANT
GOWN STRL REUS W/TWL LRG LVL3 (GOWN DISPOSABLE) ×6 IMPLANT
KIT ABG SYR 3ML LUER SLIP (SYRINGE) IMPLANT
NDL HYPO 25X5/8 SAFETYGLIDE (NEEDLE) IMPLANT
NEEDLE HYPO 22GX1.5 SAFETY (NEEDLE) ×3 IMPLANT
NEEDLE HYPO 25X5/8 SAFETYGLIDE (NEEDLE) IMPLANT
NS IRRIG 1000ML POUR BTL (IV SOLUTION) ×3 IMPLANT
PACK C SECTION WH (CUSTOM PROCEDURE TRAY) ×3 IMPLANT
PAD ABD 7.5X8 STRL (GAUZE/BANDAGES/DRESSINGS) ×3 IMPLANT
PAD OB MATERNITY 4.3X12.25 (PERSONAL CARE ITEMS) ×3 IMPLANT
PENCIL SMOKE EVAC W/HOLSTER (ELECTROSURGICAL) ×3 IMPLANT
RTRCTR C-SECT PINK 25CM LRG (MISCELLANEOUS) ×3 IMPLANT
SPONGE GAUZE 4X4 12PLY STER LF (GAUZE/BANDAGES/DRESSINGS) ×3 IMPLANT
STRIP CLOSURE SKIN 1/2X4 (GAUZE/BANDAGES/DRESSINGS) ×3 IMPLANT
SUT VIC AB 0 CTX 36 (SUTURE) ×9
SUT VIC AB 0 CTX36XBRD ANBCTRL (SUTURE) ×6 IMPLANT
SUT VIC AB 4-0 KS 27 (SUTURE) ×3 IMPLANT
SYR 30ML LL (SYRINGE) ×3 IMPLANT
TOWEL OR 17X24 6PK STRL BLUE (TOWEL DISPOSABLE) ×3 IMPLANT
TRAY FOLEY W/BAG SLVR 14FR LF (SET/KITS/TRAYS/PACK) ×3 IMPLANT

## 2017-07-27 NOTE — Anesthesia Postprocedure Evaluation (Signed)
Anesthesia Post Note  Patient: Courtany A Coco  Procedure(s) Performed: REPEAT CESAREAN SECTION (N/A ) BILATERAL TUBAL LIGATION (Bilateral Abdomen)     Patient location during evaluation: Mother Baby Anesthesia Type: Spinal Level of consciousness: awake and alert and oriented Pain management: satisfactory to patient Vital Signs Assessment: post-procedure vital signs reviewed and stable Respiratory status: respiratory function stable and spontaneous breathing Cardiovascular status: blood pressure returned to baseline Postop Assessment: no headache, no backache, spinal receding, patient able to bend at knees and adequate PO intake Anesthetic complications: no    Last Vitals:  Vitals:   07/27/17 1245 07/27/17 1303  BP: 139/85 (!) 147/85  Pulse: 76 85  Resp: 15 18  Temp:  37.3 C  SpO2: 97% 98%    Last Pain:  Vitals:   07/27/17 1310  TempSrc:   PainSc: 4    Pain Goal: Patients Stated Pain Goal: 3 (07/27/17 1310)               Camelle Henkels

## 2017-07-27 NOTE — Anesthesia Preprocedure Evaluation (Addendum)
Anesthesia Evaluation  Patient identified by MRN, date of birth, ID band Patient awake    Reviewed: Allergy & Precautions, NPO status , Patient's Chart, lab work & pertinent test results  History of Anesthesia Complications (+) PONV and history of anesthetic complications  Airway Mallampati: III  TM Distance: >3 FB Neck ROM: Full    Dental no notable dental hx. (+) Dental Advisory Given   Pulmonary asthma ,    Pulmonary exam normal        Cardiovascular hypertension, Normal cardiovascular exam     Neuro/Psych  Headaches, PSYCHIATRIC DISORDERS    GI/Hepatic GERD  ,  Endo/Other  Hyperthyroidism Morbid obesity  Renal/GU      Musculoskeletal   Abdominal   Peds  Hematology   Anesthesia Other Findings   Reproductive/Obstetrics                            Anesthesia Physical Anesthesia Plan  ASA: III  Anesthesia Plan: Spinal and MAC   Post-op Pain Management:    Induction:   PONV Risk Score and Plan: Ondansetron, Dexamethasone and Scopolamine patch - Pre-op  Airway Management Planned: Natural Airway  Additional Equipment:   Intra-op Plan:   Post-operative Plan:   Informed Consent: I have reviewed the patients History and Physical, chart, labs and discussed the procedure including the risks, benefits and alternatives for the proposed anesthesia with the patient or authorized representative who has indicated his/her understanding and acceptance.   Dental advisory given  Plan Discussed with: Anesthesiologist and CRNA  Anesthesia Plan Comments:        Anesthesia Quick Evaluation

## 2017-07-27 NOTE — Anesthesia Postprocedure Evaluation (Signed)
Anesthesia Post Note  Patient: Debra Horn  Procedure(s) Performed: REPEAT CESAREAN SECTION (N/A ) BILATERAL TUBAL LIGATION (Bilateral Abdomen)     Patient location during evaluation: PACU Anesthesia Type: MAC and Spinal Level of consciousness: awake and alert Pain management: pain level controlled Vital Signs Assessment: post-procedure vital signs reviewed and stable Respiratory status: spontaneous breathing and respiratory function stable Cardiovascular status: blood pressure returned to baseline and stable Postop Assessment: spinal receding Anesthetic complications: no    Last Vitals:  Vitals:   07/27/17 1238 07/27/17 1245  BP: 135/89 139/85  Pulse: 81 76  Resp: 14 15  Temp:    SpO2: 100% 97%    Last Pain:  Vitals:   07/27/17 1238  TempSrc:   PainSc: 4    Pain Goal:                 Lamonte Hartt DANIEL

## 2017-07-27 NOTE — Addendum Note (Signed)
Addendum  created 07/27/17 1419 by Graciela HusbandsFussell, Claud Gowan O, CRNA   Sign clinical note

## 2017-07-27 NOTE — Interval H&P Note (Signed)
History and Physical Interval Note:  07/27/2017 9:34 AM  Debra Horn  has presented today for surgery, with the diagnosis of RCS  The various methods of treatment have been discussed with the patient and family. After consideration of risks, benefits and other options for treatment, the patient has consented to  Procedure(s): REPEAT CESAREAN SECTION (N/A) BILATERAL TUBAL LIGATION (Bilateral) as a surgical intervention .  The patient's history has been reviewed, patient examined, no change in status, stable for surgery.  I have reviewed the patient's chart and labs.  Questions were answered to the patient's satisfaction.     Reva Boresanya S Lukis Bunt

## 2017-07-27 NOTE — Lactation Note (Addendum)
This note was copied from a baby's chart. Lactation Consultation Note Baby 13 hrs old sent to CN d/t low temps. Mom w/flat affect, quiet. Didn't Bf her other 2 children.  Mom has large pendulous breast w/flat nipple at the bottom end of breast that will evert w/stimulation, then flatten at rest. Mom states she knows how to hand express and has colostrum. Mom appeared slightly irritable, LC didn't push a lot of questions. Encouraged mom to call for questions or needs assistance. Mom encouraged to feed baby 8-12 times/24 hours and with feeding cues. Mom encouraged to waken baby for feeds if hasn't cued in 3 hrs. Gave mom LPI information sheet. Discussed baby being under 6 lbs that is why we go by that information. Mom has 22 cal Similac giving w/curve tip syring. Discussed pumping, giving colostrum before formula, not to mix together. RN set up DEBP. Encouraged to pump 5-6 times a day until milk comes.  Mom states she pumped with her 2nd child but her milk dried up. Reviewed importance of frequent timely pumping.  Encouraged STS, strict I&O, supply and demand.  [facility]/LC brochure given w/resources, support groups and LC services. Call for questions or concerns.  Patient Name: Debra Tivis RingerLauren Horn ZOXWR'UToday's Date: 07/27/2017 Reason for consult: Initial assessment   Maternal Data Has patient been taught Hand Expression?: Yes  Feeding Feeding Type: Formula  LATCH Score       Type of Nipple: Flat  Comfort (Breast/Nipple): Soft / non-tender        Interventions Interventions: Breast feeding basics reviewed;Hand express  Lactation Tools Discussed/Used     Consult Status Consult Status: Follow-up Date: 07/27/17 Follow-up type: In-patient    Charyl DancerCARVER, [name] 07/27/2017, 11:49 PM

## 2017-07-27 NOTE — Anesthesia Procedure Notes (Signed)
Spinal  Patient location during procedure: OR Start time: 07/27/2017 9:43 AM End time: 07/27/2017 9:50 AM Staffing Anesthesiologist: Heather RobertsSinger, Brihany Butch, MD Performed: anesthesiologist  Preanesthetic Checklist Completed: patient identified, surgical consent, pre-op evaluation, timeout performed, IV checked, risks and benefits discussed and monitors and equipment checked Spinal Block Patient position: sitting Prep: DuraPrep Patient monitoring: cardiac monitor, continuous pulse ox and blood pressure Approach: midline Location: L2-3 Injection technique: single-shot Needle Needle type: Pencan  Needle gauge: 24 G Needle length: 9 cm Additional Notes Functioning IV was confirmed and monitors were applied. Sterile prep and drape, including hand hygiene and sterile gloves were used. The patient was positioned and the spine was prepped. The skin was anesthetized with lidocaine.  Free flow of clear CSF was obtained prior to injecting local anesthetic into the CSF.  The spinal needle aspirated freely following injection.  The needle was carefully withdrawn.  The patient tolerated the procedure well.

## 2017-07-27 NOTE — Op Note (Signed)
Operative Report  PATIENT: Debra Horn  PROCEDURE DATE: 07/27/2017  PREOPERATIVE DIAGNOSES: Intrauterine pregnancy at [redacted]w[redacted]d weeks gestation; prior C-section ;  Undesired fertility  POSTOPERATIVE DIAGNOSES: The same  PROCEDURE: Repeat Low Transverse Cesarean Section; Bilateral Tubal Sterilization using Filshie clips  SURGEON:   Surgeon(s) and Role: Panel 1:    Reva Bores, MD - Primary    * Degele, Kandra Nicolas, MD - Fellow Panel 2:    * Reva Bores, MD - Primary    * Arvilla Market, DO - Fellow    Nolene Ebbs, MD - OB Fellow  ASSISTANT:  Nolene Ebbs, MD - OB Fellow   INDICATIONS: Debra Horn is a 32 y.o. [phone] at [redacted]w[redacted]d here for cesarean section secondary to the indications listed under preoperative diagnoses; please see preoperative note for further details.  The risks of cesarean section were discussed with the patient including but were not limited to: bleeding which may require transfusion or reoperation; infection which may require antibiotics; injury to bowel, bladder, ureters or other surrounding organs; injury to the fetus; need for additional procedures including hysterectomy in the event of a life-threatening hemorrhage; placental abnormalities wth subsequent pregnancies, incisional problems, thromboembolic phenomenon and other postoperative/anesthesia complications.   The patient concurred with the proposed plan, giving informed written consent for the procedure.    FINDINGS:  Viable female infant in cephalic presentation.  Apgars 8 and 9.  Clear amniotic fluid.  Intact placenta, three vessel cord.  Normal uterus, fallopian tubes and ovaries bilaterally.  ANESTHESIA: Spinal INTRAVENOUS FLUIDS: 2000 mL  ESTIMATED BLOOD LOSS: 309 mL URINE OUTPUT:  50 ml SPECIMENS: Placenta sent to L&D COMPLICATIONS: None immediate  PROCEDURE IN DETAIL:  The patient preoperatively received intravenous antibiotics and had sequential compression devices applied to her  lower extremities.  She was then taken to the operating room where spinal anesthesia was administered and was found to be adequate. She was then placed in a dorsal supine position with a leftward tilt, and prepped and draped in a sterile manner.  A foley catheter was placed into her bladder and attached to constant gravity.    After an adequate timeout was performed, a Pfannenstiel skin incision was made with scalpel over her preexisting scar and carried through to the underlying layer of fascia. The fascia was incised in the midline, and this incision was extended bilaterally using the Mayo scissors.  Kocher clamps were applied to the superior aspect of the fascial incision and the underlying rectus muscles were dissected off bluntly.  A similar process was carried out on the inferior aspect of the fascial incision. The rectus muscles were separated in the midline bluntly and the peritoneum was entered bluntly. Attention was turned to the lower uterine segment where a low transverse hysterotomy was made with a scalpel and extended bilaterally bluntly.  The infant was successfully delivered, the cord was clamped and cut after one minute, and the infant was handed over to the awaiting neonatology team. Uterine massage was then administered, and the placenta delivered intact with a three-vessel cord. The uterus was then cleared of clots and debris.  The hysterotomy was closed with 0 Vicryl in a running locked fashion, and an imbricating layer was also placed with 0 Vicryl.    Attention was then turned to the fallopian tubes, and Filshie clips were placed about 3 cm from the cornua, with care given to incorporate the underlying mesosalpinx on both sides, allowing for bilateral tubal sterilization. The pelvis was  cleared of all clot and debris. Hemostasis was confirmed on all surfaces. The fascia was then closed using 0 Vicryl  in a running fashion.  The subcutaneous layer was irrigated and 30 ml of 0.5% Marcaine was  injected subcutaneously around the incision.  The skin was closed with a 4-0 Vicryl subcuticular stitch.   The patient tolerated the procedure well. Sponge, lap, instrument and needle counts were correct x 3.  She was taken to the recovery room in stable condition.     Disposition: PACU - hemodynamically stable.   Maternal Condition: stable   Marcy Sirenatherine Hodge Stachnik, D.O. OB Fellow  07/27/2017, 11:18 AM

## 2017-07-27 NOTE — Transfer of Care (Signed)
Immediate Anesthesia Transfer of Care Note  Patient: Debra Horn  Procedure(s) Performed: REPEAT CESAREAN SECTION (N/A ) BILATERAL TUBAL LIGATION (Bilateral Abdomen)  Patient Location: PACU  Anesthesia Type:Spinal  Level of Consciousness: awake and alert   Airway & Oxygen Therapy: Patient Spontanous Breathing  Post-op Assessment: Report given to RN and Post -op Vital signs reviewed and stable  Post vital signs: Reviewed  Last Vitals:  Vitals Value Taken Time  BP 130/79 07/27/2017 11:27 AM  Temp    Pulse 66 07/27/2017 11:28 AM  Resp 14 07/27/2017 11:28 AM  SpO2 100 % 07/27/2017 11:28 AM  Vitals shown include unvalidated device data.  Last Pain:  Vitals:   07/27/17 0755  TempSrc: Oral  PainSc: 0-No pain         Complications: No apparent anesthesia complications

## 2017-07-27 NOTE — Consult Note (Signed)
Neonatology Note:   Attendance at C-section:    I was asked by Dr. Pratt to attend this repeat C/S at term. The mother is a G3P2 B pos, GBS neg with hyperthyroidism and history of PE. ROM at delivery, fluid clear. Infant vigorous with good spontaneous cry and tone. Delayed cord clamping was done. Needed only minimal bulb suctioning. Ap 8/9. Lungs clear to ausc in DR. Infant is able to remain with his mother for skin to skin time under nursing supervision. Transferred to the care of Pediatrician.   Debra Horn C. Karlis Cregg, MD   

## 2017-07-28 LAB — CBC
HCT: 28.7 % — ABNORMAL LOW (ref 36.0–46.0)
Hemoglobin: 9 g/dL — ABNORMAL LOW (ref 12.0–15.0)
MCH: 25 pg — AB (ref 26.0–34.0)
MCHC: 31.4 g/dL (ref 30.0–36.0)
MCV: 79.7 fL (ref 78.0–100.0)
PLATELETS: 299 10*3/uL (ref 150–400)
RBC: 3.6 MIL/uL — ABNORMAL LOW (ref 3.87–5.11)
RDW: 17.2 % — ABNORMAL HIGH (ref 11.5–15.5)
WBC: 18.4 10*3/uL — ABNORMAL HIGH (ref 4.0–10.5)

## 2017-07-28 MED ORDER — HYDROXYZINE HCL 25 MG PO TABS
25.0000 mg | ORAL_TABLET | Freq: Three times a day (TID) | ORAL | Status: DC
  Administered 2017-07-28 – 2017-07-30 (×5): 25 mg via ORAL
  Filled 2017-07-28 (×10): qty 1

## 2017-07-28 NOTE — Progress Notes (Signed)
Patient ID: Debra Horn Pusey, female   DOB: Aug 13, 1985, 32 y.o.   MRN: [id number]   POSTPARTUM PROGRESS NOTE  Post Operative Day 1 Subjective:  Debra Horn Moise is Horn 32 y.o. [id number] 761w0d s/p rLTCS.  No acute events overnight.  Pt denies problems with ambulating, voiding or po intake.  She denies nausea or vomiting.  Pain is well controlled.  She has had flatus. She has not had bowel movement.  Lochia Moderate. Patient is complaining of whole body pruritus.   Objective: Blood pressure 139/80, pulse (!) 50, temperature 97.7 F (36.5 C), temperature source Oral, resp. rate 18, height 5\' 3"  (1.6 m), weight 96.5 kg (212 lb 11.2 oz), last menstrual period 10/14/2016, SpO2 98 %, unknown if currently breastfeeding.  Physical Exam:  General: alert, cooperative and no distress Lochia:normal flow Chest: no respiratory distress Heart:regular rate, distal pulses intact Incision: covered with dressing Abdomen: soft, nontender,  Uterine Fundus: firm, appropriately tender DVT Evaluation: No calf swelling or tenderness Extremities: mild LE edema  Recent Labs    07/27/17 0808 07/28/17 0612  HGB 9.6* 9.0*  HCT 31.3* 28.7*    Assessment/Plan:  ASSESSMENT: Debra Horn Kawabata is Horn 32 y.o. [id number] 8161w0d s/p rLTCS/BTL.  D/c tomorrow or the day after.  Currently on lovenox 30mg  BID.  History of PE.    Breastfeeding and Contraception BTL.     LOS: 1 day   Jackelyn KnifeDaniel K OlsonMD 07/28/2017, 10:58 AM

## 2017-07-28 NOTE — Lactation Note (Signed)
This note was copied from a baby's chart. Lactation Consultation Note  Patient Name: Boy Tivis RingerLauren Harville FAOZH'YToday's Date: 07/28/2017 Reason for consult: Follow-up assessment;Infant < 6lbs;Term  P3 mother whose infant is now 10835 hours old.    Follow up visit made to be sure mother does not have any questions/concerns regarding breastfeeding.  She stated baby did cluster feeding last night and she is supplementing with formula.  She is not pumping with the DEBP on a regular basis which I encouraged.  Baby is also sucking on a pacifier and I encouraged her to eliminate the pacifier and to put baby to breast every time he shows feeding cues.    Suggested she continue to feed 8-12 times/24 hours or more if he shows feeding cues.  Informed her that he may want to cluster feed again tonight and to let him feed as often as he desires.  Encouraged hand expression before and after feeds and to do STS with breast compressions during feeds.  Mother stated she is doing this.    Mother will call for assistance as needed throughout the night.  Father present and supportive.   Maternal Data Formula Feeding for Exclusion: No Has patient been taught Hand Expression?: Yes  Feeding    LATCH Score                   Interventions    Lactation Tools Discussed/Used     Consult Status Consult Status: Follow-up Date: 07/29/17 Follow-up type: In-patient    Sair Faulcon R Marijean Montanye 07/28/2017, 9:20 PM

## 2017-07-28 NOTE — Progress Notes (Signed)
   07/28/17 0057  Vital Signs  Patient Position (if appropriate) Orthostatic Vitals  Orthostatic Lying   BP- Lying 131/76  Pulse- Lying 57  Orthostatic Sitting  BP- Sitting 123/89  Pulse- Sitting 61  Orthostatic Standing at 0 minutes  BP- Standing at 0 minutes (!) 140/91 (asymptomatic)  Pulse- Standing at 0 minutes 56  pt ambulated well with RN to the bathroom. Peri-care performed and pt ambulated back to bed without complications. Pt denies dizziness and lightheadedness.

## 2017-07-29 MED ORDER — OXYCODONE HCL 5 MG PO TABS: 5.0000 mg | ORAL_TABLET | ORAL | 0 refills | Status: DC | PRN

## 2017-07-29 MED ORDER — IBUPROFEN 800 MG PO TABS: 800.0000 mg | ORAL_TABLET | Freq: Three times a day (TID) | ORAL | 0 refills | Status: DC | PRN

## 2017-07-29 MED ORDER — ACETAMINOPHEN 160 MG/5ML PO SOLN: 650.0000 mg | ORAL | 0 refills | Status: DC | PRN

## 2017-07-29 NOTE — Lactation Note (Signed)
This note was copied from a baby's chart. Lactation Consultation Note  Patient Name: Boy Tivis RingerLauren Bores ZOXWR'UToday's Date: 07/29/2017 Reason for consult: Follow-up assessment;Infant < 6lbs;Term  Visited with P3 Mom of term baby <6 lbs at 5052 hrs old.  Mom has been latching baby to breast occasionally, but primarily formula feeding by bottle.  Talked with Mom about what her goals are with feeding her baby.  Mom stated she would like to breastfeed.  Baby due to eat, as it had been 3 hrs.  Recommended baby be unswaddled and placed STS with Mom.  Mom asked for formula.  Talked about importance of double pumping whenever the baby is fed formula.  Mom agrees with needing to pump.  DEBP set up at bedside.  Mom stated she pumped, but doesn't have any milk yet.  Explained to Mom that this was normal, but to keep baby STS, and offer breast before she supplements would be beneficial.  FOB is supportive of breastfeeding.  Mom acting like she doesn't feel well, moving slowly, not smiling.  Asked her if she would like anything, and we started talking about her losing her job, and not being able to take her children to their Pediatrician due to losing her insurance.  [facility] and she plans to ask MD tomorrow morning.    Mom states she has WIC.  [facility]. Encouraged Mom to ask for help prn.    Consult Status Consult Status: Follow-up Date: 07/30/17 Follow-up type: In-patient    Judee ClaraSmith, Onetha Gaffey E 07/29/2017, 3:12 PM

## 2017-07-29 NOTE — Progress Notes (Signed)
   07/29/17 1030  Vital Signs  BP 120/78  BP Location Left Arm  Patient Position (if appropriate) Semi-fowlers  BP Method Automatic  Pulse Rate 63  Pulse Rate Source Dinamap  Resp 16   Pt c/o feeling "alittle dizzy". She states that it was more when she got up in room. She states that she has not slept or eaten much this AM. Encouraged pt to eat, drink fluids, and rest. Juice and crackers given to pt. Instructed pt to call for assistance when getting up if she is dizzy. Will cont to monitor.

## 2017-07-29 NOTE — Discharge Summary (Deleted)
OB Discharge Summary     Patient Name: Debra Horn DOB: 08-Oct-1985 MRN: [id number]  Date of admission: 07/27/2017 Delivering MD: Reva Bores   Date of discharge: 07/29/2017  Admitting diagnosis: RCS Intrauterine pregnancy: [redacted]w[redacted]d     Secondary diagnosis:  Principal Problem:   Previous cesarean section Active Problems:   Hx pulmonary embolism   Status post repeat low transverse cesarean section  Additional problems: Hx of Preeclampsia and Anemia     Discharge diagnosis: Term Pregnancy Delivered                                                                                                Post partum procedures:none  Augmentation: C-section  Complications: None  Hospital course:  Sceduled C/S   32 y.o. yo G3P1203 at [redacted]w[redacted]d was admitted to the hospital 07/27/2017 for scheduled cesarean section with the following indication:Elective Repeat.  Membrane Rupture Time/Date: 10:18 AM ,07/27/2017   Patient delivered a Viable infant.07/27/2017  Details of operation can be found in separate operative note.  Pateint had an uncomplicated postpartum course.  She is ambulating, tolerating a regular diet, passing flatus, and urinating well. Patient is discharged home in stable condition on  07/29/17         Physical exam  Vitals:   07/28/17 0900 07/28/17 1547 07/28/17 2315 07/29/17 0557  BP: 132/83 129/78 127/78 113/71  Pulse: (!) 56 75 (!) 54 (!) 56  Resp:    18  Temp: 98.3 F (36.8 C) 98.5 F (36.9 C) 98.2 F (36.8 C) 98.6 F (37 C)  TempSrc:  Oral Oral Oral  SpO2: 100%     Weight:      Height:       General: alert, cooperative and no distress Lochia: appropriate Uterine Fundus: firm Incision: Dressing is clean, dry, and intact DVT Evaluation: No evidence of DVT seen on physical exam. No cords or calf tenderness. Labs: Lab Results  Component Value Date   WBC 18.4 (H) 07/28/2017   HGB 9.0 (L) 07/28/2017   HCT 28.7 (L) 07/28/2017   MCV 79.7 07/28/2017   PLT 299  07/28/2017   CMP Latest Ref Rng & Units 07/27/2017  Glucose 70 - 99 mg/dL 78  BUN 6 - 20 mg/dL 13  Creatinine 9.62 - 9.52 mg/dL 8.41  Sodium 324 - 401 mmol/L 135  Potassium 3.5 - 5.1 mmol/L 3.9  Chloride 98 - 111 mmol/L 108  CO2 22 - 32 mmol/L 18(L)  Calcium 8.9 - 10.3 mg/dL 0.2(V)  Total Protein 6.5 - 8.1 g/dL 7.2  Total Bilirubin 0.3 - 1.2 mg/dL 0.3  Alkaline Phos 38 - 126 U/L 110  AST 15 - 41 U/L 17  ALT 0 - 44 U/L 12    Discharge instruction: per After Visit Summary and "Baby and Me Booklet".  After visit meds:  Allergies as of 07/29/2017      Reactions   Banana Anaphylaxis   Cefixime Rash      Medication List    STOP taking these medications   aspirin EC 81 MG tablet   methocarbamol 750 MG tablet Commonly  known as:  ROBAXIN-750   metoCLOPramide 10 MG tablet Commonly known as:  REGLAN   pantoprazole 40 MG tablet Commonly known as:  PROTONIX   ranitidine 300 MG tablet Commonly known as:  ZANTAC     TAKE these medications   acetaminophen 160 MG/5ML solution Commonly known as:  TYLENOL Take 20.3 mLs (650 mg total) by mouth every 4 (four) hours as needed (for pain scale < 4).   albuterol 108 (90 Base) MCG/ACT inhaler Commonly known as:  PROAIR HFA TAKE 2 PUFFS BY MOUTH EVERY 6 HOURS AS NEEDED FOR WHEEZE OR SHORTNESS OF BREATH   enoxaparin 30 MG/0.3ML injection Commonly known as:  LOVENOX Inject 0.3 mLs (30 mg total) into the skin every 12 (twelve) hours.   ferrous sulfate 325 (65 FE) MG tablet Commonly known as:  FERROUSUL Take 1 tablet (325 mg total) by mouth daily with breakfast.   glycopyrrolate 1 MG tablet Commonly known as:  ROBINUL Take 1 tablet (1 mg total) by mouth 3 (three) times daily.   ibuprofen 800 MG tablet Commonly known as:  ADVIL,MOTRIN Take 1 tablet (800 mg total) by mouth every 8 (eight) hours as needed.   oxyCODONE 5 MG immediate release tablet Commonly known as:  Oxy IR/ROXICODONE Take 1 tablet (5 mg total) by mouth every 4  (four) hours as needed (pain scale 4-7).   Prenatal Adult Gummy/DHA/FA 0.4-25 MG Chew Chew 1 tablet by mouth daily.   tinidazole 500 MG tablet Commonly known as:  TINDAMAX Take 2 tablets (1,000 mg total) by mouth daily with breakfast.   Vitamin D (Ergocalciferol) 50000 units Caps capsule Commonly known as:  DRISDOL Take 1 capsule (50,000 Units total) by mouth every 7 (seven) days.       Diet: routine diet  Activity: Advance as tolerated. Pelvic rest for 6 weeks.   Outpatient follow up:1 Week Follow up Appt: Future Appointments  Date Time Provider Department Center  08/10/2017  8:45 AM Brock BadHarper, Charles A, MD [facility] None  08/24/2017 10:15 AM Brock BadHarper, Charles A, MD [facility] None   Follow up Visit:No follow-ups on file.  Postpartum contraception: Tubal Ligation  Newborn Data: Live born female  Birth Weight: 5 lb 12.6 oz (2625 g) APGAR: 8, 9  Newborn Delivery   Birth date/time:  07/27/2017 10:19:00 Delivery type:  C-Section, Low Transverse Trial of labor:  No C-section categorization:  Repeat     Baby Feeding: Breast Disposition:home with mother   07/29/2017 Sandi RavelingAnson B Ikaika Showers, MD

## 2017-07-29 NOTE — Progress Notes (Signed)
Occassional dizziness at rest sitting VS WNL. Pt is eating dinner now otherwise she has had crackers today. Encouraged to eat dinner and sleep.  SS ordered for possible depression, out of focus and guarded, Debra Horn was 0.

## 2017-07-29 NOTE — Progress Notes (Signed)
Pt denies dizziness.  Pt has ambulated in her room and is sitting up on the couch. She has not slept today.

## 2017-07-30 LAB — BIRTH TISSUE RECOVERY COLLECTION (PLACENTA DONATION)

## 2017-07-30 MED ORDER — ENOXAPARIN SODIUM 30 MG/0.3ML ~~LOC~~ SOLN: 40.0000 mg | Freq: Two times a day (BID) | SUBCUTANEOUS | 12 refills | Status: DC

## 2017-07-30 NOTE — Lactation Note (Signed)
This note was copied from a baby's chart. Lactation Consultation Note  Patient Name: Boy Tivis RingerLauren Clavin RUEAV'WToday's Date: 07/30/2017 Reason for consult: Follow-up assessment;Term;Infant < 6lbs  P3 mother whose infant is now 5770 hours old.  Mother is breast/bottle feeding.  She has been using the DEBP.  Mother is very sleepy and had a hard time keeping her eyes open when I was speaking with her.  She stated she did not have any questions/concerns at this time.  Reviewed engorgement prevention/treatment with her.  She is familiar with hand expression and I encouraged her to continue doing this after feedings.  Since she is so sleepy I offered to return if she has any questions before discharge.  Father awake and will call if my assistance is needed.     Maternal Data Formula Feeding for Exclusion: No Has patient been taught Hand Expression?: Yes  Feeding Feeding Type: Formula Nipple Type: Slow - flow  LATCH Score                   Interventions    Lactation Tools Discussed/Used     Consult Status Consult Status: Complete Date: 07/30/17 Follow-up type: Call as needed    Amiaya Mcneeley R Tanya Crothers 07/30/2017, 8:53 AM

## 2017-07-30 NOTE — Discharge Instructions (Signed)

## 2017-07-30 NOTE — Progress Notes (Signed)
CSW received consult for concern regarding possible depression due to MOB's affect.  CSW met with MOB and FOB to offer support and complete assessment.  Parents were pleasant and welcoming, however, CSW found them difficult to engage.  CSW agrees that MOB appears to have a flat affect, but she states that she is tired and ready to be at home in her own space/bed.  She smiled frequently during the conversation and does not appear depressed.  She seems to be quiet-natured.  FOB seems supportive and was providing care to baby while we spoke.  MOB denies any emotional concerns at this time and states she felt well during pregnancy.  She also denies any hx of PMADs after her 31 and 46 year olds were born.  She states her mother is visiting from [place] and that the couple has a great support system of family and friends who live locally as well.  MOB reports that her children may return to [place] with her mother until school starts so that she can have time to bond with baby, rest, and her older kids can be allowed to be more active.  CSW provided education regarding PMADs and encouraged parents to notify a medical professional if symptoms arise.  MOB states she feels comfortable with her providers and commits to monitoring her emotions.  She thanked CSW for the visit and states no questions, concerns or needs at this time.

## 2017-07-30 NOTE — Discharge Summary (Addendum)
OB Discharge Summary     Patient Name: Debra Horn DOB: 04-29-1985 MRN: [id number]  Date of admission: 07/27/2017 Delivering MD: Reva Bores   Date of discharge: 07/30/2017  Admitting diagnosis: RCS Intrauterine pregnancy: [redacted]w[redacted]d     Secondary diagnosis:  Principal Problem:   Previous cesarean section Active Problems:   Hx pulmonary embolism   Status post repeat low transverse cesarean section  Additional problems: Hyperthyroid, asthma, IUGR     Discharge diagnosis: Term Pregnancy Delivered                                                                                                Post partum procedures:n/a  Augmentation: n/a  Complications: None  Hospital course:  Sceduled C/S   32 y.o. yo G3P1203 at [redacted]w[redacted]d was admitted to the hospital 07/27/2017 for scheduled cesarean section with the following indication:Elective Repeat.  Membrane Rupture Time/Date: 10:18 AM ,07/27/2017   Patient delivered a Viable infant.07/27/2017  Details of operation can be found in separate operative note.  Pateint had an uncomplicated postpartum course.  She is ambulating, tolerating a regular diet, passing flatus, and urinating well. Patient is discharged home in stable condition on  07/30/17         Physical exam  Vitals:   07/29/17 1145 07/29/17 1457 07/29/17 1804 07/30/17 0609  BP: 118/83 123/83 118/74 132/85  Pulse: (!) 59 67 80 79  Resp: 18 20 18 18   Temp: 97.9 F (36.6 C) 98 F (36.7 C)  97.7 F (36.5 C)  TempSrc:  Oral  Oral  SpO2: 100%     Weight:      Height:       General: alert, cooperative and no distress Lochia: appropriate Uterine Fundus: firm Incision: Dressing is clean, dry, and intact DVT Evaluation: No evidence of DVT seen on physical exam. Negative Homan's sign. No cords or calf tenderness. No significant calf/ankle edema. Labs: Lab Results  Component Value Date   WBC 18.4 (H) 07/28/2017   HGB 9.0 (L) 07/28/2017   HCT 28.7 (L) 07/28/2017   MCV 79.7  07/28/2017   PLT 299 07/28/2017   CMP Latest Ref Rng & Units 07/27/2017  Glucose 70 - 99 mg/dL 78  BUN 6 - 20 mg/dL 13  Creatinine 2.72 - 5.36 mg/dL 6.44  Sodium 034 - 742 mmol/L 135  Potassium 3.5 - 5.1 mmol/L 3.9  Chloride 98 - 111 mmol/L 108  CO2 22 - 32 mmol/L 18(L)  Calcium 8.9 - 10.3 mg/dL 5.9(D)  Total Protein 6.5 - 8.1 g/dL 7.2  Total Bilirubin 0.3 - 1.2 mg/dL 0.3  Alkaline Phos 38 - 126 U/L 110  AST 15 - 41 U/L 17  ALT 0 - 44 U/L 12    Discharge instruction: per After Visit Summary and "Baby and Me Booklet".  After visit meds:  Allergies as of 07/30/2017      Reactions   Banana Anaphylaxis   Cefixime Rash      Medication List    STOP taking these medications   aspirin EC 81 MG tablet   methocarbamol 750 MG tablet Commonly  known as:  ROBAXIN-750   metoCLOPramide 10 MG tablet Commonly known as:  REGLAN   pantoprazole 40 MG tablet Commonly known as:  PROTONIX   ranitidine 300 MG tablet Commonly known as:  ZANTAC     TAKE these medications   acetaminophen 160 MG/5ML solution Commonly known as:  TYLENOL Take 20.3 mLs (650 mg total) by mouth every 4 (four) hours as needed (for pain scale < 4).   albuterol 108 (90 Base) MCG/ACT inhaler Commonly known as:  PROAIR HFA TAKE 2 PUFFS BY MOUTH EVERY 6 HOURS AS NEEDED FOR WHEEZE OR SHORTNESS OF BREATH   enoxaparin 30 MG/0.3ML injection Commonly known as:  LOVENOX Inject 0.3 mLs (30 mg total) into the skin every 12 (twelve) hours.   ferrous sulfate 325 (65 FE) MG tablet Commonly known as:  FERROUSUL Take 1 tablet (325 mg total) by mouth daily with breakfast.   glycopyrrolate 1 MG tablet Commonly known as:  ROBINUL Take 1 tablet (1 mg total) by mouth 3 (three) times daily.   ibuprofen 800 MG tablet Commonly known as:  ADVIL,MOTRIN Take 1 tablet (800 mg total) by mouth every 8 (eight) hours as needed.   oxyCODONE 5 MG immediate release tablet Commonly known as:  Oxy IR/ROXICODONE Take 1 tablet (5 mg  total) by mouth every 4 (four) hours as needed (pain scale 4-7).   Prenatal Adult Gummy/DHA/FA 0.4-25 MG Chew Chew 1 tablet by mouth daily.   tinidazole 500 MG tablet Commonly known as:  TINDAMAX Take 2 tablets (1,000 mg total) by mouth daily with breakfast.   Vitamin D (Ergocalciferol) 50000 units Caps capsule Commonly known as:  DRISDOL Take 1 capsule (50,000 Units total) by mouth every 7 (seven) days.       Diet: routine diet  Activity: Advance as tolerated. Pelvic rest for 6 weeks.   Outpatient follow up:2 weeks, on 08/10/17 Follow up Appt: Future Appointments  Date Time Provider Department Center  08/10/2017  8:45 AM Brock BadHarper, Charles A, MD [facility] None  08/24/2017 10:15 AM Brock BadHarper, Charles A, MD [facility] None   Follow up Visit:No follow-ups on file.  Postpartum contraception: Undecided  Newborn Data: Live born female  Birth Weight: 5 lb 12.6 oz (2625 g) APGAR: 8, 9  Newborn Delivery   Birth date/time:  07/27/2017 10:19:00 Delivery type:  C-Section, Low Transverse Trial of labor:  No C-section categorization:  Repeat     Baby Feeding: Breast Disposition:home with mother  Will Add Lovenox 40mg  bid for postpartum period per consult Dr Adrian BlackwaterStinson  07/30/2017 Sharen CounterLisa Leftwich-Kirby, CNM

## 2017-07-31 ENCOUNTER — Inpatient Hospital Stay (HOSPITAL_COMMUNITY)
Admission: AD | Admit: 2017-07-31 | Discharge: 2017-07-31 | Disposition: A | Discharge status: Home / Self Care | Payer: Medicaid Other | Source: Ambulatory Visit | Attending: Family Medicine | Admitting: Family Medicine

## 2017-07-31 ENCOUNTER — Encounter (HOSPITAL_COMMUNITY): Payer: Self-pay | Admitting: *Deleted

## 2017-07-31 ENCOUNTER — Inpatient Hospital Stay (HOSPITAL_COMMUNITY): Payer: Medicaid Other

## 2017-07-31 DIAGNOSIS — Z86711 Personal history of pulmonary embolism: Secondary | ICD-10-CM | POA: Diagnosis not present

## 2017-07-31 DIAGNOSIS — R0602 Shortness of breath: Secondary | ICD-10-CM | POA: Diagnosis not present

## 2017-07-31 DIAGNOSIS — O165 Unspecified maternal hypertension, complicating the puerperium: Secondary | ICD-10-CM | POA: Diagnosis not present

## 2017-07-31 LAB — COMPREHENSIVE METABOLIC PANEL
ALBUMIN: 2.6 g/dL — AB (ref 3.5–5.0)
ALT: 17 U/L (ref 0–44)
AST: 22 U/L (ref 15–41)
Alkaline Phosphatase: 86 U/L (ref 38–126)
Anion gap: 10 (ref 5–15)
BUN: 8 mg/dL (ref 6–20)
CHLORIDE: 102 mmol/L (ref 98–111)
CO2: 23 mmol/L (ref 22–32)
CREATININE: 0.61 mg/dL (ref 0.44–1.00)
Calcium: 8.8 mg/dL — ABNORMAL LOW (ref 8.9–10.3)
GFR calc Af Amer: >60 mL/min (ref >60)
GLUCOSE: 90 mg/dL (ref 70–99)
POTASSIUM: 4.1 mmol/L (ref 3.5–5.1)
Sodium: 135 mmol/L (ref 135–145)
TOTAL PROTEIN: 6 g/dL — AB (ref 6.5–8.1)
Total Bilirubin: 0.5 mg/dL (ref 0.3–1.2)

## 2017-07-31 LAB — URINALYSIS, ROUTINE W REFLEX MICROSCOPIC
BILIRUBIN URINE: NEGATIVE
Glucose, UA: NEGATIVE mg/dL
Ketones, ur: NEGATIVE mg/dL
Nitrite: NEGATIVE
PROTEIN: NEGATIVE mg/dL
SPECIFIC GRAVITY, URINE: 1.014 (ref 1.005–1.030)
pH: 9 — ABNORMAL HIGH (ref 5.0–8.0)

## 2017-07-31 LAB — PROTEIN / CREATININE RATIO, URINE
CREATININE, URINE: 43 mg/dL
PROTEIN CREATININE RATIO: 0.14 mg/mg{creat} (ref 0.00–0.15)
TOTAL PROTEIN, URINE: 6 mg/dL

## 2017-07-31 LAB — CBC
HEMATOCRIT: 28.7 % — AB (ref 36.0–46.0)
Hemoglobin: 9 g/dL — ABNORMAL LOW (ref 12.0–15.0)
MCH: 25.4 pg — ABNORMAL LOW (ref 26.0–34.0)
MCHC: 31.4 g/dL (ref 30.0–36.0)
MCV: 80.8 fL (ref 78.0–100.0)
PLATELETS: 383 10*3/uL (ref 150–400)
RBC: 3.55 MIL/uL — AB (ref 3.87–5.11)
RDW: 17.7 % — AB (ref 11.5–15.5)
WBC: 10.7 10*3/uL — AB (ref 4.0–10.5)

## 2017-07-31 MED ORDER — ENOXAPARIN SODIUM 30 MG/0.3ML ~~LOC~~ SOLN: 40.0000 mg | Freq: Two times a day (BID) | SUBCUTANEOUS | 12 refills | Status: DC

## 2017-07-31 MED ORDER — IOPAMIDOL (ISOVUE-370) INJECTION 76%
100.0000 mL | Freq: Once | INTRAVENOUS | Status: AC | PRN
  Administered 2017-07-31: 100 mL via INTRAVENOUS

## 2017-07-31 MED ORDER — FUROSEMIDE 40 MG PO TABS: 40.0000 mg | ORAL_TABLET | Freq: Every day | ORAL | 0 refills | Status: DC

## 2017-07-31 MED ORDER — AMLODIPINE BESYLATE 5 MG PO TABS: 5.0000 mg | ORAL_TABLET | Freq: Every day | ORAL | 1 refills | Status: DC

## 2017-07-31 MED ORDER — FUROSEMIDE 10 MG/ML IJ SOLN
20.0000 mg | Freq: Once | INTRAMUSCULAR | Status: AC
  Administered 2017-07-31: 20 mg via INTRAVENOUS
  Filled 2017-07-31: qty 2

## 2017-07-31 NOTE — MAU Provider Note (Addendum)
Patient Debra Horn is a 32 y.o. [phone] At 4 days postpartum here with concerns about swelling, difficulty breathing.  She is status post RLTCS on 07-27-2017. She denies HA, blurry vision. She is very anxious because she feels like her "back is swelling". She denies nausea and vomiting. She has a history of pre-e with severe features but was not diagnosed with pre-e in this pregnancy. She have a history of spontaneous PE 5 years ago.  She was discharged from [facility] yesterday on daily lovenox.  Patient has a history of multiple MAU visits.  History     CSN: [id number]  Arrival date and time: 07/31/17 1325   First Provider Initiated Contact with Patient 07/31/17 1408      No chief complaint on file.  HPI  She started having swelling in her legs and feet at discharge yesterday but then started having more swelling today in her back and neck.  She had a PE 4 years ago, spontaneously, which was treated with Lovenox. She has some abdominal pain at her incision. She denies HA, blurry vision, floating spots, NV.   OB History    Gravida  3   Para  3   Term  1   Preterm  2   AB      Living  3     SAB      TAB      Ectopic      Multiple  0   Live Births  3           Past Medical History:  Diagnosis Date  . Anemia    low iron  . Asthma   . Complication of anesthesia    bp dropped in middle of last c-section and started with n/v  . GERD (gastroesophageal reflux disease)    during pregnancy  . Headache    "couple days/week" (10/28/2015)  . Hyperthyroidism    not on medication  . PE (pulmonary embolism) 11/2011  . PONV (postoperative nausea and vomiting)   . Pregnancy induced hypertension   . Preterm labor     Past Surgical History:  Procedure Laterality Date  . CESAREAN SECTION  2008; 2014  . CESAREAN SECTION N/A 07/27/2017   Procedure: REPEAT CESAREAN SECTION;  Surgeon: Reva Bores, MD;  Location: [facility] BIRTHING SUITES;  Service: Obstetrics;  Laterality: N/A;   . FRACTURE SURGERY    . ORIF ANKLE FRACTURE Right 10/28/2015  . ORIF ANKLE FRACTURE Right 10/28/2015   Procedure: OPEN REDUCTION INTERNAL FIXATION (ORIF) RIGHT ANKLE FRACTURE;  Surgeon: Yolonda Kida, MD;  Location: [facility] OR;  Service: Orthopedics;  Laterality: Right;  . TUBAL LIGATION Bilateral 07/27/2017   Procedure: BILATERAL TUBAL LIGATION;  Surgeon: Reva Bores, MD;  Location: [facility] BIRTHING SUITES;  Service: Obstetrics;  Laterality: Bilateral;    Family History  Problem Relation Age of Onset  . Hypertension Maternal Grandfather   . Hypertension Paternal Grandmother   . Cancer Paternal Grandmother   . Hyperlipidemia Paternal Grandmother   . Hypertension Father   . Cancer Sister        skin  . Cancer Maternal Grandmother   . Rheum arthritis Mother   . Lupus Mother   . Hypothyroidism Maternal Aunt   . Lupus Other     Social History   Tobacco Use  . Smoking status: Never Smoker  . Smokeless tobacco: Never Used  Substance Use Topics  . Alcohol use: No  . Drug use: No    Allergies:  Allergies  Allergen Reactions  . Banana Anaphylaxis  . Cefixime Rash    Medications Prior to Admission  Medication Sig Dispense Refill Last Dose  . acetaminophen (TYLENOL) 160 MG/5ML solution Take 20.3 mLs (650 mg total) by mouth every 4 (four) hours as needed (for pain scale < 4). 120 mL 0   . albuterol (PROAIR HFA) 108 (90 Base) MCG/ACT inhaler TAKE 2 PUFFS BY MOUTH EVERY 6 HOURS AS NEEDED FOR WHEEZE OR SHORTNESS OF BREATH 8.5 Inhaler 5 07/26/2017 at Unknown time  . enoxaparin (LOVENOX) 30 MG/0.3ML injection Inject 0.4 mLs (40 mg total) into the skin every 12 (twelve) hours. 30 Syringe 12   . ferrous sulfate (FERROUSUL) 325 (65 FE) MG tablet Take 1 tablet (325 mg total) by mouth daily with breakfast. (Patient not taking: Reported on 07/27/2017) 60 tablet 10 Not Taking at Unknown time  . glycopyrrolate (ROBINUL) 1 MG tablet Take 1 tablet (1 mg total) by mouth 3 (three) times daily.  (Patient not taking: Reported on 06/27/2017) 30 tablet 2 Not Taking  . ibuprofen (ADVIL,MOTRIN) 800 MG tablet Take 1 tablet (800 mg total) by mouth every 8 (eight) hours as needed. 30 tablet 0   . oxyCODONE (OXY IR/ROXICODONE) 5 MG immediate release tablet Take 1 tablet (5 mg total) by mouth every 4 (four) hours as needed (pain scale 4-7). 30 tablet 0   . Prenatal MV & Min w/FA-DHA (PRENATAL ADULT GUMMY/DHA/FA) 0.4-25 MG CHEW Chew 1 tablet by mouth daily. (Patient not taking: Reported on 07/27/2017) 30 tablet 10 Not Taking at Unknown time  . tinidazole (TINDAMAX) 500 MG tablet Take 2 tablets (1,000 mg total) by mouth daily with breakfast. (Patient not taking: Reported on 07/27/2017) 10 tablet 2 Not Taking at Unknown time  . Vitamin D, Ergocalciferol, (DRISDOL) 50000 units CAPS capsule Take 1 capsule (50,000 Units total) by mouth every 7 (seven) days. (Patient not taking: Reported on 06/27/2017) 30 capsule 2 Not Taking    Review of Systems  Constitutional: Negative.   HENT: Negative.   Respiratory: Positive for shortness of breath.   Cardiovascular: Negative.   Gastrointestinal: Positive for abdominal pain.  Genitourinary: Negative.   Musculoskeletal: Negative.   Neurological: Negative.   Psychiatric/Behavioral: Negative.    Physical Exam   Blood pressure (!) 139/92, pulse 95, temperature 98.2 F (36.8 C), temperature source Oral, resp. rate 20, SpO2 100 %, unknown if currently breastfeeding.  Physical Exam  Constitutional: She appears well-developed.  HENT:  Head: Normocephalic.  Neck: Normal range of motion.  Respiratory: Effort normal.  GI: Soft.  Musculoskeletal: Normal range of motion.  Neurological: She is alert.  Skin: Skin is warm.  Psychiatric: She has a normal mood and affect.  Dressing is clean and dry; no leaking or swelling.  No obvious edema on her neck and back; mild edema in lower extremities.    MAU Course  Procedures  MDM Patient appears distressed but cannot  say why; keeps repeating that "her neck and back seem swollen". n. At times she is observed napping in MAU.  CT scan is negative for PE; EKG normal.  Pre-e labs normal.  20 mg of IV lasix given.  CBC shows anemia  I have independently reviewed the imaging of her CT scans.   Assessment and Plan   1. Postpartum hypertension   2. Hx pulmonary embolism    2. Patient has occasionally elevated blood pressures but no other s/s of pre-e. Labs are normal. Will discharge home with norvasc 5 mg and 40  mg of Lasix.   3. Patient to come to walk-in clinic on Friday morning, Aug 2nd for follow-up BP check.   4. All questions answered; patient stable for discharge with return precautions given.  Charlesetta Garibaldi Awilda Covin 07/31/2017, 2:10 PM

## 2017-07-31 NOTE — MAU Note (Signed)
Pt had a C/S on Friday, DC'd home yesterday, feels swollen in her feet, legs, abdomen, back, "everywhere."  Feeling SOB for the last 1-2 hours.  Has changed 3-4 pads today.

## 2017-07-31 NOTE — MAU Note (Signed)
EKG in process

## 2017-07-31 NOTE — Discharge Instructions (Signed)
Come to clinic on Friday morning, Aug 2nd, for repeat Blood Pressure check.   Postpartum Hypertension Postpartum hypertension is high blood pressure after pregnancy that remains higher than normal for more than two days after delivery. You may not realize that you have postpartum hypertension if your blood pressure is not being checked regularly. In some cases, postpartum hypertension will go away on its own, usually within a week of delivery. However, for some women, medical treatment is required to prevent serious complications, such as seizures or stroke. The following things can affect your blood pressure:  The type of delivery you had.  Having received IV fluids or other medicines during or after delivery.  What are the causes? Postpartum hypertension may be caused by any of the following or by a combination of any of the following:  Hypertension that existed before pregnancy (chronic hypertension).  Gestational hypertension.  Preeclampsia or eclampsia.  Receiving a lot of fluid through an IV during or after delivery.  Medicines.  HELLP syndrome.  Hyperthyroidism.  Stroke.  Other rare neurological or blood disorders.  In some cases, the cause may not be known. What increases the risk? Postpartum hypertension can be related to one or more risk factors, such as:  Chronic hypertension. In some cases, this may not have been diagnosed before pregnancy.  Obesity.  Type 2 diabetes.  Kidney disease.  Family history of preeclampsia.  Other medical conditions that cause hormonal imbalances.  What are the signs or symptoms? As with all types of hypertension, postpartum hypertension may not have any symptoms. Depending on how high your blood pressure is, you may experience:  Headaches. These may be mild, moderate, or severe. They may also be steady, constant, or sudden in onset (thunderclap headache).  Visual changes.  Dizziness.  Shortness of breath.  Swelling of  your hands, feet, lower legs, or face. In some cases, you may have swelling in more than one of these locations.  Heart palpitations or a racing heartbeat.  Difficulty breathing while lying down.  Decreased urination.  Other rare signs and symptoms may include:  Sweating more than usual. This lasts longer than a few days after delivery.  Chest pain.  Sudden dizziness when you get up from sitting or lying down.  Seizures.  Nausea or vomiting.  Abdominal pain.  How is this diagnosed? The diagnosis of postpartum hypertension is made through a combination of physical examination findings and testing of your blood and urine. You may also have additional tests, such as a CT scan or an MRI, to check for other complications of postpartum hypertension. How is this treated? When blood pressure is high enough to require treatment, your options may include:  Medicines to reduce blood pressure (antihypertensives). Tell your health care provider if you are breastfeeding or if you plan to breastfeed. There are many antihypertensive medicines that are safe to take while breastfeeding.  Stopping medicines that may be causing hypertension.  Treating medical conditions that are causing hypertension.  Treating the complications of hypertension, such as seizures, stroke, or kidney problems.  Your health care provider will also continue to monitor your blood pressure closely and repeatedly until it is within a safe range for you. Follow these instructions at home:  Take medicines only as directed by your health care provider.  Get regular exercise after your health care provider tells you that it is safe.  Follow your health care providers recommendations on fluid and salt restrictions.  Do not use any tobacco products, including cigarettes,  chewing tobacco, or electronic cigarettes. If you need help quitting, ask your health care provider.  Keep all follow-up visits as directed by your  health care provider. This is important. Contact a health care provider if:  Your symptoms get worse.  You have new symptoms, such as: ? Headache. ? Dizziness. ? Visual changes. Get help right away if:  You develop a severe or sudden headache.  You have seizures.  You develop numbness or weakness on one side of your body.  You have difficulty thinking, speaking, or swallowing.  You develop severe abdominal pain.  You develop difficulty breathing, chest pain, a racing heartbeat, or heart palpitations. These symptoms may represent a serious problem that is an emergency. Do not wait to see if the symptoms will go away. Get medical help right away. Call your local emergency services (911 in the [place] Do not drive yourself to the hospital. This information is not intended to replace advice given to you by your health care provider. Make sure you discuss any questions you have with your health care provider. Document Released: 08/22/2013 Document Revised: 05/24/2015 Document Reviewed: 07/03/2013 Elsevier Interactive Patient Education  [employer].

## 2017-08-07 ENCOUNTER — Other Ambulatory Visit: Payer: Self-pay | Admitting: Student

## 2017-08-07 MED ORDER — FUROSEMIDE 40 MG PO TABS: 40.0000 mg | ORAL_TABLET | Freq: Every day | ORAL | 0 refills | Status: DC

## 2017-08-10 ENCOUNTER — Ambulatory Visit: Payer: Medicaid Other | Admitting: Obstetrics

## 2017-08-14 ENCOUNTER — Other Ambulatory Visit: Payer: Self-pay | Admitting: Student

## 2017-08-24 ENCOUNTER — Encounter: Payer: Self-pay | Admitting: Obstetrics

## 2017-08-24 ENCOUNTER — Ambulatory Visit (INDEPENDENT_AMBULATORY_CARE_PROVIDER_SITE_OTHER): Payer: Medicaid Other | Admitting: Obstetrics

## 2017-08-24 DIAGNOSIS — R109 Unspecified abdominal pain: Secondary | ICD-10-CM

## 2017-08-24 DIAGNOSIS — Z1389 Encounter for screening for other disorder: Secondary | ICD-10-CM

## 2017-08-24 DIAGNOSIS — E0590 Thyrotoxicosis, unspecified without thyrotoxic crisis or storm: Secondary | ICD-10-CM

## 2017-08-24 DIAGNOSIS — Z86711 Personal history of pulmonary embolism: Secondary | ICD-10-CM

## 2017-08-24 DIAGNOSIS — G8918 Other acute postprocedural pain: Secondary | ICD-10-CM

## 2017-08-24 DIAGNOSIS — Z8759 Personal history of other complications of pregnancy, childbirth and the puerperium: Secondary | ICD-10-CM

## 2017-08-24 MED ORDER — IBUPROFEN 800 MG PO TABS: 800.0000 mg | ORAL_TABLET | Freq: Three times a day (TID) | ORAL | 5 refills | Status: DC | PRN

## 2017-08-24 MED ORDER — OXYCODONE-ACETAMINOPHEN 5-325 MG PO TABS: 1.0000 | ORAL_TABLET | ORAL | 0 refills | Status: DC | PRN

## 2017-08-24 NOTE — Progress Notes (Signed)
Post Partum Exam  Debra Horn is a 32 y.o. [phone] female who presents for a postpartum visit. She is 4 weeks postpartum following a low cervical transverse Cesarean section. I have fully reviewed the prenatal and intrapartum course. The delivery was at 39 gestational weeks.  Anesthesia: spinal. Postpartum course has been unremarkable. Baby's course has been unremarkable. Baby is feeding by both breast and bottle - Similac Neosure. Bleeding no bleeding. Bowel function is normal. Bladder function is normal. Patient is not sexually active. Contraception method is tubal ligation. Postpartum depression screening:neg EPDS= 1  The following portions of the patient's history were reviewed and updated as appropriate: allergies, current medications, past family history, past medical history, past social history, past surgical history and problem list. Last pap smear done 2019 and was Normal  Review of Systems A comprehensive review of systems was negative.    Objective:  unknown if currently breastfeeding.  General:  alert and no distress   Breasts:  inspection negative, no nipple discharge or bleeding, no masses or nodularity palpable  Lungs: clear to auscultation bilaterally  Heart:  regular rate and rhythm, S1, S2 normal, no murmur, click, rub or gallop  Abdomen: soft, non-tender; bowel sounds normal; no masses,  no organomegaly and incision is clean, dry and intact   Assessment:    1. Postpartum care following cesarean delivery - doing well  2. Postoperative abdominal pain Rx: - ibuprofen (ADVIL,MOTRIN) 800 MG tablet; Take 1 tablet (800 mg total) by mouth every 8 (eight) hours as needed.  Don't start until Lovenox regimen is completed.  Dispense: 30 tablet; Refill: 5 - oxyCODONE-acetaminophen (PERCOCET/ROXICET) 5-325 MG tablet; Take 1 tablet by mouth every 4 (four) hours as needed for severe pain.  Dispense: 20 tablet; Refill: 0  3. Hx of preeclampsia, prior pregnancy - BP is  stable  4. Hx pulmonary embolism - stable, on Lovenox for 6 weeks postpartum  5. Hyperthyroidism - stable, managed by PCP  Plan:   1. Contraception: tubal ligation - done with C/S 2. Continue Lovenox for 6 weeks postpartum 3. Follow up in: 2 weeks or as needed.    Brock BadHARLES A. HARPER MD 08-24-2017

## 2017-08-27 ENCOUNTER — Encounter (HOSPITAL_COMMUNITY): Payer: Self-pay

## 2017-09-06 ENCOUNTER — Ambulatory Visit: Payer: Medicaid Other | Admitting: Obstetrics

## 2017-09-11 ENCOUNTER — Ambulatory Visit: Payer: Medicaid Other

## 2017-11-26 ENCOUNTER — Other Ambulatory Visit: Payer: Self-pay | Admitting: Obstetrics

## 2017-11-26 DIAGNOSIS — Z98891 History of uterine scar from previous surgery: Secondary | ICD-10-CM

## 2017-11-26 NOTE — Telephone Encounter (Signed)
One more refill until patient can get to primary care for this rx

## 2018-01-05 ENCOUNTER — Other Ambulatory Visit: Payer: Self-pay | Admitting: Obstetrics

## 2018-01-05 DIAGNOSIS — R109 Unspecified abdominal pain: Principal | ICD-10-CM

## 2018-01-05 DIAGNOSIS — G8918 Other acute postprocedural pain: Secondary | ICD-10-CM

## 2018-01-19 ENCOUNTER — Other Ambulatory Visit: Payer: Self-pay | Admitting: Obstetrics

## 2018-01-19 DIAGNOSIS — Z98891 History of uterine scar from previous surgery: Secondary | ICD-10-CM

## 2018-04-11 ENCOUNTER — Other Ambulatory Visit: Payer: Self-pay | Admitting: Obstetrics

## 2018-04-11 DIAGNOSIS — Z98891 History of uterine scar from previous surgery: Secondary | ICD-10-CM

## 2018-07-19 ENCOUNTER — Ambulatory Visit (INDEPENDENT_AMBULATORY_CARE_PROVIDER_SITE_OTHER)
Admission: RE | Admit: 2018-07-19 | Discharge: 2018-07-19 | Disposition: A | Discharge status: Home / Self Care | Payer: BLUE CROSS/BLUE SHIELD | Source: Ambulatory Visit

## 2018-07-19 DIAGNOSIS — Z98891 History of uterine scar from previous surgery: Secondary | ICD-10-CM

## 2018-07-19 DIAGNOSIS — J4521 Mild intermittent asthma with (acute) exacerbation: Secondary | ICD-10-CM

## 2018-07-19 MED ORDER — ALBUTEROL SULFATE HFA 108 (90 BASE) MCG/ACT IN AERS: INHALATION_SPRAY | RESPIRATORY_TRACT | 1 refills | Status: DC

## 2018-07-19 NOTE — Discharge Instructions (Signed)
Use inhaler as directed Return if you develop fever, aches, cough, SOB, chest pain.

## 2018-07-19 NOTE — ED Provider Notes (Signed)
Virtual Visit via Video Note:  Debra Horn  initiated request for Telemedicine visit with [facility]gent [facility] team. I connected with Debra Horn  on 07/19/2018 at 8:35 AM  for a synchronized telemedicine visit using a video enabled HIPPA compliant telemedicine application. I verified that I am speaking with Debra Horn  using two identifiers. [place] Hall-Potvin, PA-C  was physically located in a [facility] Urgent care site and Debra Horn was located at a different location.   The limitations of evaluation and management by telemedicine as well as the availability of in-person appointments were discussed. Patient was informed that she  may incur a bill ( including co-pay) for this virtual visit encounter. Debra Horn  expressed understanding and gave verbal consent to proceed with virtual visit.     History of Present Illness:Debra Horn  is a 33 y.o. female presents with a concern for medication refill.  Patient has a history of asthma, states that she had an asthma attack yesterday for which she used her eldest son's nebulizer with a solution with adequate resolution of symptoms.  Patient feels her asthma attack was triggered by "somebody smoking outside my house ".  Patient states that she works from home, has not had any sick contacts.  Denies URI symptoms, cough, fever, aches, fatigue.  Patient denies chest tightness or wheezing today.  Patient has been looking for PCP with difficulty since COVID.  Past Medical History:  Diagnosis Date  . Anemia    low iron  . Asthma   . Complication of anesthesia    bp dropped in middle of last c-section and started with n/v  . GERD (gastroesophageal reflux disease)    during pregnancy  . Headache    "couple days/week" (10/28/2015)  . Hyperthyroidism    not on medication  . PE (pulmonary embolism) 11/2011  . PONV (postoperative nausea and vomiting)   . Pregnancy induced hypertension   . Preterm labor      Allergies  Allergen Reactions  . Banana Anaphylaxis  . Cefixime Rash        Observations/Objective: 33 year old female sitting in no acute distress.  Patient is able to speak in full sentences without pausing to cough, wheeze, sneeze.  Assessment and Plan: 33 year old female with history of mild intermittent asthma presenting for medication refill status post asthma attack yesterday.  Refill provided, provided contact information for PCP for further management.  Follow Up Instructions: Patient to follow-up with PCP for continuity of care.  Return precautions discussed, patient verbalized understanding and is agreeable to plan.   I discussed the assessment and treatment plan with the patient. The patient was provided an opportunity to ask questions and all were answered. The patient agreed with the plan and demonstrated an understanding of the instructions.   The patient was advised to call back or seek an in-person evaluation if the symptoms worsen or if the condition fails to improve as anticipated.  I provided 15 minutes of non-face-to-face time during this encounter.    [place], PA-C  07/19/2018 8:35 AM        Hall-Potvin, [place], PA-C 07/19/18 [phone]

## 2018-09-13 ENCOUNTER — Other Ambulatory Visit: Payer: Self-pay | Admitting: Obstetrics

## 2018-09-13 DIAGNOSIS — Z98891 History of uterine scar from previous surgery: Secondary | ICD-10-CM

## 2018-10-04 ENCOUNTER — Ambulatory Visit (INDEPENDENT_AMBULATORY_CARE_PROVIDER_SITE_OTHER)
Admission: RE | Admit: 2018-10-04 | Discharge: 2018-10-04 | Disposition: A | Discharge status: Home / Self Care | Payer: BC Managed Care – PPO | Source: Ambulatory Visit

## 2018-10-04 DIAGNOSIS — J4541 Moderate persistent asthma with (acute) exacerbation: Secondary | ICD-10-CM | POA: Diagnosis not present

## 2018-10-04 DIAGNOSIS — Z98891 History of uterine scar from previous surgery: Secondary | ICD-10-CM

## 2018-10-04 MED ORDER — ALBUTEROL SULFATE HFA 108 (90 BASE) MCG/ACT IN AERS: INHALATION_SPRAY | RESPIRATORY_TRACT | 0 refills | Status: DC

## 2018-10-04 NOTE — Discharge Instructions (Addendum)
Albuterol as needed. Continue flovant as directed. Over the counter flonase/nasacort to help with congestion. As discussed, if symptoms not improving with albuterol, or need increased use of albuterol to control symptoms, will need in person evaluation to assess need for steroids. As we cannot rule out COVID as a cause of symptoms, continue to quarantine and work from home, we may need to test for COVID if asthma not controlled with albuterol. If symptoms worsens and unable to speak in full sentences despite albuterol use, go to the ED for further evaluation needed.

## 2018-10-04 NOTE — ED Provider Notes (Signed)
Virtual Visit via Video Note:  Debra Horn  initiated request for Telemedicine visit with [facility] Urgent Care team. I connected with Debra Horn  on 10/04/2018 at 8:59 AM  for a synchronized telemedicine visit using a video enabled HIPPA compliant telemedicine application. I verified that I am speaking with Debra Horn  using two identifiers. Debra Vanetten Jodell Cipro, PA-C  was physically located in a [facility] Urgent care site and Debra Horn was located at a different location.   The limitations of evaluation and management by telemedicine as well as the availability of in-person appointments were discussed. Patient was informed that she  may incur a bill ( including co-pay) for this virtual visit encounter. Debra Horn  expressed understanding and gave verbal consent to proceed with virtual visit.     History of Present Illness:Debra Horn  is a 33 y.o. female presents with 1 day history of asthma exacerbation. Patient states she was started on Flovent a few months ago, and has felt had better control of asthma.  However, she currently does not have an albuterol inhaler.  She woke up this morning with some wheezing and chest tightness, and therefore set up an appointment.  She has mild nasal congestion and cough.  Denies fever, chills, body aches.  Denies abdominal pain, nausea, vomiting, diarrhea.  Denies loss of taste/smell.  She works from home, no sick/COVID contact.  Past Medical History:  Diagnosis Date  . Anemia    low iron  . Asthma   . Complication of anesthesia    bp dropped in middle of last c-section and started with n/v  . GERD (gastroesophageal reflux disease)    during pregnancy  . Headache    "couple days/week" (10/28/2015)  . Hyperthyroidism    not on medication  . PE (pulmonary embolism) 11/2011  . PONV (postoperative nausea and vomiting)   . Pregnancy induced hypertension   . Preterm labor     Allergies  Allergen Reactions  . Banana Anaphylaxis   . Cefixime Rash        Observations/Objective: General: Well appearing, nontoxic, no acute distress. Sitting comfortably. Head: Normocephalic, atraumatic Eye: No conjunctival injection, eyelid swelling. EOMI ENT: Mucus membranes moist, no lip cracking. No obvious nasal drainage. Pulm: Speaking in full sentences without difficulty. Normal effort. No respiratory distress, accessory muscle use. No audible wheezing. Neuro: Normal mental status. Alert and oriented x 3.   Assessment and Plan: Patient able to speak in full sentences, no audible wheezing. Discussed may need COVID testing. Given currently patient works from home and has been quarantined, will defer COVID testing and have patient continue to quarantine. Albuterol inhaler refilled. Discussed if patient requiring more than Q4-6H of albuterol and lasting more than 3-5 days, will need in person evaluation for possible prednisone and COVID testing. Return precautions given. Patient expresses understanding and agrees to plan.  Follow Up Instructions:    I discussed the assessment and treatment plan with the patient. The patient was provided an opportunity to ask questions and all were answered. The patient agreed with the plan and demonstrated an understanding of the instructions.   The patient was advised to call back or seek an in-person evaluation if the symptoms worsen or if the condition fails to improve as anticipated.  I provided 15 minutes of non-face-to-face time during this encounter.    Ok Edwards, PA-C  10/04/2018 8:59 AM         Ok Edwards, PA-C  10/04/18 0927  

## 2018-10-23 ENCOUNTER — Other Ambulatory Visit: Payer: Self-pay

## 2018-10-23 ENCOUNTER — Encounter: Payer: Self-pay | Admitting: Emergency Medicine

## 2018-10-23 ENCOUNTER — Ambulatory Visit
Admission: EM | Admit: 2018-10-23 | Discharge: 2018-10-23 | Disposition: A | Discharge status: Home / Self Care | Payer: BC Managed Care – PPO | Attending: Emergency Medicine | Admitting: Emergency Medicine

## 2018-10-23 DIAGNOSIS — R062 Wheezing: Secondary | ICD-10-CM | POA: Diagnosis not present

## 2018-10-23 DIAGNOSIS — R0981 Nasal congestion: Secondary | ICD-10-CM

## 2018-10-23 DIAGNOSIS — Z1159 Encounter for screening for other viral diseases: Secondary | ICD-10-CM

## 2018-10-23 DIAGNOSIS — Z98891 History of uterine scar from previous surgery: Secondary | ICD-10-CM

## 2018-10-23 DIAGNOSIS — R52 Pain, unspecified: Secondary | ICD-10-CM

## 2018-10-23 MED ORDER — LORATADINE 10 MG PO TABS: 10.0000 mg | ORAL_TABLET | Freq: Every day | ORAL | 0 refills | Status: DC

## 2018-10-23 MED ORDER — PREDNISONE 20 MG PO TABS: 20.0000 mg | ORAL_TABLET | Freq: Every day | ORAL | 0 refills | Status: DC

## 2018-10-23 MED ORDER — ALBUTEROL SULFATE HFA 108 (90 BASE) MCG/ACT IN AERS: INHALATION_SPRAY | RESPIRATORY_TRACT | 0 refills | Status: DC

## 2018-10-23 MED ORDER — FLUTICASONE PROPIONATE 50 MCG/ACT NA SUSP: 1.0000 | Freq: Every day | NASAL | 0 refills | Status: DC

## 2018-10-23 NOTE — Discharge Instructions (Addendum)
Your COVID test is pending: Is important you quarantine at home until your results are back. °You may take OTC Tylenol for fever and myalgias.  It is important to drink plenty of water throughout the day to stay hydrated. °If you test positive and later develop severe fever, cough, or shortness of breath, it is recommended that you go to the ER for further evaluation. °

## 2018-10-23 NOTE — ED Triage Notes (Signed)
Pt c/o body aches, fatigue, dizziness, sore throat and small cough for the past 1.5wks, worse in past few days.

## 2018-10-23 NOTE — ED Provider Notes (Signed)
[facility]    CSN: [id number] Arrival date & time: 10/23/18  1355      History   Chief Complaint Chief Complaint  Patient presents with  . Generalized Body Aches    HPI Debra Horn is a 33 y.o. female with history of asthma presenting for 1.5-week course of myalgias, fatigue, nasal congestion/pressure, dizziness, sore throat.  Patient states that this became worse over the last 2 days.  Patient states her dizziness is a "off sensation ".  Denies tinnitus, change in hearing, change in vision, room spinning sensation, nausea, vomiting.  Patient also admitting to decreased appetite, though is able to keep down fluids for hydration.  Patient is also noticed increased dyspnea.  Patient has albuterol inhaler at home, states this helps some.  Patient also notes mildly productive cough this morning without hemoptysis.   Past Medical History:  Diagnosis Date  . Anemia    low iron  . Asthma   . Complication of anesthesia    bp dropped in middle of last c-section and started with n/v  . GERD (gastroesophageal reflux disease)    during pregnancy  . Headache    "couple days/week" (10/28/2015)  . Hyperthyroidism    not on medication  . PE (pulmonary embolism) 11/2011  . PONV (postoperative nausea and vomiting)   . Pregnancy induced hypertension   . Preterm labor     Patient Active Problem List   Diagnosis Date Noted  . Status post repeat low transverse cesarean section 07/27/2017  . Obesity affecting pregnancy in third trimester   . Poor fetal growth affecting management of mother in third trimester   . Previous cesarean delivery affecting pregnancy   . Previous pregnancy complicated by pregnancy-induced hypertension in third trimester, antepartum   . [redacted] weeks gestation of pregnancy   . Unwanted fertility 06/06/2017  . IUGR (intrauterine growth restriction) in prior pregnancy, pregnant 03/06/2017  . Vitamin D deficiency 02/17/2017  . Hx of preeclampsia, prior  pregnancy, currently pregnant 02/09/2017  . History of preterm delivery 02/09/2017  . History of asthma   . Normocytic anemia 02/06/2016  . Family history of systemic lupus erythematosus (SLE) in mother 02/19/2014  . Hyperthyroidism 02/19/2014  . Previous cesarean section 08/08/2012  . Hx pulmonary embolism 07/24/2012  . Supervision of high-risk pregnancy 07/24/2012    Past Surgical History:  Procedure Laterality Date  . CESAREAN SECTION  2008; 2014  . CESAREAN SECTION N/A 07/27/2017   Procedure: REPEAT CESAREAN SECTION;  Surgeon: Donnamae Jude, MD;  Location: [place];  Service: Obstetrics;  Laterality: N/A;  . FRACTURE SURGERY    . ORIF ANKLE FRACTURE Right 10/28/2015  . ORIF ANKLE FRACTURE Right 10/28/2015   Procedure: OPEN REDUCTION INTERNAL FIXATION (ORIF) RIGHT ANKLE FRACTURE;  Surgeon: Nicholes Stairs, MD;  Location: [facility];  Service: Orthopedics;  Laterality: Right;  . TUBAL LIGATION Bilateral 07/27/2017   Procedure: BILATERAL TUBAL LIGATION;  Surgeon: Donnamae Jude, MD;  Location: [place];  Service: Obstetrics;  Laterality: Bilateral;    OB History    Gravida  3   Para  3   Term  1   Preterm  2   AB      Living  3     SAB      TAB      Ectopic      Multiple  0   Live Births  3            Home Medications  Prior to Admission medications   Medication Sig Start Date End Date Taking? Authorizing Provider  albuterol (PROAIR HFA) 108 (90 Base) MCG/ACT inhaler INHALE 2 PUFFS BY MOUTH EVERY 6 HOURS AS NEEDED FOR WHEEZE OR SHORTNESS OF BREATH 10/23/18   Hall-Potvin, [place], PA-C  fluticasone (FLONASE) 50 MCG/ACT nasal spray Place 1 spray into both nostrils daily. 10/23/18   Hall-Potvin, [place], PA-C  loratadine (CLARITIN) 10 MG tablet Take 1 tablet (10 mg total) by mouth daily. 10/23/18   Hall-Potvin, [place], PA-C  predniSONE (DELTASONE) 20 MG tablet Take 1 tablet (20 mg total) by mouth daily. 10/23/18   Hall-Potvin,  [place], PA-C  enoxaparin (LOVENOX) 30 MG/0.3ML injection Inject 0.4 mLs (40 mg total) into the skin every 12 (twelve) hours. 07/31/17 07/19/18  Marylene Land, CNM  ferrous sulfate (FERROUSUL) 325 (65 FE) MG tablet Take 1 tablet (325 mg total) by mouth daily with breakfast. Patient not taking: Reported on 07/27/2017 02/21/17 07/19/18  Leftwich-Kirby, Wilmer Floor, CNM  furosemide (LASIX) 40 MG tablet Take 1 tablet (40 mg total) by mouth daily. Patient not taking: Reported on 08/24/2017 08/07/17 07/19/18  Marylene Land, CNM  glycopyrrolate (ROBINUL) 1 MG tablet Take 1 tablet (1 mg total) by mouth 3 (three) times daily. Patient not taking: Reported on 06/27/2017 02/09/17 07/19/18  Roe Coombs, CNM  pantoprazole (PROTONIX) 40 MG tablet TAKE 1 TABLET BY MOUTH EVERY DAY Patient not taking: Reported on 08/24/2017 08/09/17 07/19/18  Adam Phenix, MD    Family History Family History  Problem Relation Age of Onset  . Hypertension Maternal Grandfather   . Hypertension Paternal Grandmother   . Cancer Paternal Grandmother   . Hyperlipidemia Paternal Grandmother   . Hypertension Father   . Cancer Sister        skin  . Cancer Maternal Grandmother   . Rheum arthritis Mother   . Lupus Mother   . Hypothyroidism Maternal Aunt   . Lupus Other     Social History Social History   Tobacco Use  . Smoking status: Never Smoker  . Smokeless tobacco: Never Used  Substance Use Topics  . Alcohol use: No  . Drug use: No     Allergies   Banana and Cefixime   Review of Systems Review of Systems  Constitutional: Positive for appetite change and fatigue. Negative for fever.  HENT: Positive for congestion and sore throat. Negative for dental problem, ear pain, facial swelling, hearing loss, sinus pain, trouble swallowing and voice change.   Eyes: Negative for photophobia, pain and visual disturbance.  Respiratory: Positive for cough. Negative for chest tightness, shortness of breath and  wheezing.   Cardiovascular: Negative for chest pain and palpitations.  Gastrointestinal: Negative for abdominal pain, diarrhea and vomiting.  Musculoskeletal: Negative for arthralgias and myalgias.  Neurological: Negative for dizziness and headaches.     Physical Exam Triage Vital Signs ED Triage Vitals [10/23/18 1407]  Enc Vitals Group     BP 134/84     Pulse Rate 82     Resp 18     Temp 98.7 F (37.1 C)     Temp Source Oral     SpO2 98 %     Weight      Height      Head Circumference      Peak Flow      Pain Score 8     Pain Loc      Pain Edu?      Excl. in GC?  No data found.  Updated Vital Signs BP 134/84 (BP Location: Left Arm)   Pulse 82   Temp 98.7 F (37.1 C) (Oral)   Resp 18   LMP 10/18/2018   SpO2 98%   Visual Acuity Right Eye Distance:   Left Eye Distance:   Bilateral Distance:    Right Eye Near:   Left Eye Near:    Bilateral Near:     Physical Exam Constitutional:      General: She is not in acute distress. HENT:     Head: Normocephalic and atraumatic.     Right Ear: Tympanic membrane, ear canal and external ear normal.     Left Ear: Tympanic membrane, ear canal and external ear normal.     Nose: Nose normal.     Mouth/Throat:     Mouth: Mucous membranes are moist.     Pharynx: Oropharynx is clear. No oropharyngeal exudate or posterior oropharyngeal erythema.  Eyes:     General: No scleral icterus.    Conjunctiva/sclera: Conjunctivae normal.     Pupils: Pupils are equal, round, and reactive to light.  Neck:     Musculoskeletal: Neck supple. No muscular tenderness.  Cardiovascular:     Rate and Rhythm: Normal rate and regular rhythm.     Heart sounds: No murmur. No gallop.   Pulmonary:     Effort: Pulmonary effort is normal. No respiratory distress.     Breath sounds: No stridor. No rhonchi or rales.     Comments: Trace end phase expiratory wheezing bilaterally Abdominal:     General: Bowel sounds are normal.     Tenderness: There  is no abdominal tenderness.  Lymphadenopathy:     Cervical: No cervical adenopathy.  Skin:    General: Skin is warm.     Capillary Refill: Capillary refill takes less than 2 seconds.     Coloration: Skin is not jaundiced or pale.  Neurological:     General: No focal deficit present.     Mental Status: She is alert and oriented to person, place, and time.      UC Treatments / Results  Labs (all labs ordered are listed, but only abnormal results are displayed) Labs Reviewed  NOVEL CORONAVIRUS, NAA    EKG   Radiology No results found.  Procedures Procedures (including critical care time)  Medications Ordered in UC Medications - No data to display  Initial Impression / Assessment and Plan / UC Course  I have reviewed the triage vital signs and the nursing notes.  Pertinent labs & imaging results that were available during my care of the patient were reviewed by me and considered in my medical decision making (see chart for details).     Patient afebrile, nontoxic in office with O2 saturation 98%.  Covid test obtained in office, patient tolerated well.  Patient quarantine at home until results are back.  Given mild wheezing, history of asthma: We will add prednisone to current regimen.  Refilled albuterol inhaler as patient only has 14 actuations left.  Will provide adjuvant therapies for symptom management as outlined below.  Return precautions discussed, patient verbalized understanding and is agreeable to plan. Final Clinical Impressions(s) / UC Diagnoses   Final diagnoses:  Body aches  Nasal congestion     Discharge Instructions     Your COVID test is pending: Is important you quarantine at home until your results are back. You may take OTC Tylenol for fever and myalgias.  It is important to drink plenty  of water throughout the day to stay hydrated. If you test positive and later develop severe fever, cough, or shortness of breath, it is recommended that you go to  the ER for further evaluation.    ED Prescriptions    Medication Sig Dispense Auth. Provider   albuterol (PROAIR HFA) 108 (90 Base) MCG/ACT inhaler INHALE 2 PUFFS BY MOUTH EVERY 6 HOURS AS NEEDED FOR WHEEZE OR SHORTNESS OF BREATH 18 g Hall-Potvin, [place], PA-C   fluticasone (FLONASE) 50 MCG/ACT nasal spray Place 1 spray into both nostrils daily. 16 g Hall-Potvin, [place], PA-C   loratadine (CLARITIN) 10 MG tablet Take 1 tablet (10 mg total) by mouth daily. 30 tablet Hall-Potvin, [place], PA-C   predniSONE (DELTASONE) 20 MG tablet Take 1 tablet (20 mg total) by mouth daily. 5 tablet Hall-Potvin, [place], PA-C     PDMP not reviewed this encounter.   Hall-Potvin, [place], [place] 10/23/18 1958

## 2018-10-24 ENCOUNTER — Telehealth: Payer: Self-pay | Admitting: Emergency Medicine

## 2018-10-24 NOTE — Telephone Encounter (Signed)
Left voicemail checking in on patient, and encouraged return call with any continuing questions or concerns.    

## 2018-10-25 LAB — NOVEL CORONAVIRUS, NAA: SARS-CoV-2, NAA: NOT DETECTED

## 2018-11-25 ENCOUNTER — Ambulatory Visit (INDEPENDENT_AMBULATORY_CARE_PROVIDER_SITE_OTHER)
Admission: RE | Admit: 2018-11-25 | Discharge: 2018-11-25 | Disposition: A | Discharge status: Home / Self Care | Payer: BC Managed Care – PPO | Source: Ambulatory Visit

## 2018-11-25 DIAGNOSIS — Z98891 History of uterine scar from previous surgery: Secondary | ICD-10-CM

## 2018-11-25 DIAGNOSIS — Z760 Encounter for issue of repeat prescription: Secondary | ICD-10-CM

## 2018-11-25 MED ORDER — ALBUTEROL SULFATE HFA 108 (90 BASE) MCG/ACT IN AERS: INHALATION_SPRAY | RESPIRATORY_TRACT | 0 refills | Status: DC
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
## 2018-11-25 NOTE — Discharge Instructions (Addendum)
The refill for your albuterol inhaler has been provided to your pharmacy.    Follow-up with your primary care provider or come here to be seen in person as needed.

## 2018-12-18 ENCOUNTER — Ambulatory Visit
Admission: EM | Admit: 2018-12-18 | Discharge: 2018-12-18 | Disposition: A | Discharge status: Home / Self Care | Payer: BC Managed Care – PPO | Attending: Physician Assistant | Admitting: Physician Assistant

## 2018-12-18 DIAGNOSIS — R519 Headache, unspecified: Secondary | ICD-10-CM

## 2018-12-18 DIAGNOSIS — Z20828 Contact with and (suspected) exposure to other viral communicable diseases: Secondary | ICD-10-CM | POA: Diagnosis not present

## 2018-12-18 DIAGNOSIS — Z98891 History of uterine scar from previous surgery: Secondary | ICD-10-CM

## 2018-12-18 DIAGNOSIS — Z20822 Contact with and (suspected) exposure to covid-19: Secondary | ICD-10-CM

## 2018-12-18 MED ORDER — ALBUTEROL SULFATE HFA 108 (90 BASE) MCG/ACT IN AERS: INHALATION_SPRAY | RESPIRATORY_TRACT | 0 refills | Status: DC

## 2018-12-18 MED ORDER — DICYCLOMINE HCL 20 MG PO TABS: 20.0000 mg | ORAL_TABLET | Freq: Two times a day (BID) | ORAL | 0 refills | Status: DC

## 2018-12-18 MED ORDER — ONDANSETRON 4 MG PO TBDP: 4.0000 mg | ORAL_TABLET | Freq: Three times a day (TID) | ORAL | 0 refills | Status: DC | PRN

## 2018-12-18 MED ORDER — FLOVENT HFA 44 MCG/ACT IN AERO: 2.0000 | INHALATION_SPRAY | Freq: Two times a day (BID) | RESPIRATORY_TRACT | 0 refills | Status: DC

## 2018-12-18 NOTE — ED Triage Notes (Signed)
Pt c/o headaches, diarrhea, and loss of taste x3 days. States has had a positive covid exposure 12/06.

## 2018-12-18 NOTE — Discharge Instructions (Signed)
COVID PCR testing ordered. I would like you to quarantine until testing results. Zofran as needed for nausea. Bentyl for diarrhea/abdominal cramping. I have refilled your albuterol inhaler. Please add flovent as directed. You can take over the counter flonase/nasacort to help with nasal congestion/drainage. If experiencing shortness of breath, trouble breathing, go to the emergency department for further evaluation needed.

## 2018-12-18 NOTE — ED Provider Notes (Signed)
[facility]    CSN: [id number] Arrival date & time: 12/18/18  1158      History   Chief Complaint Chief Complaint  Patient presents with  . Headache    HPI Debra Horn is a 33 y.o. female.   33 year old female comes in for 3 day of COVID like symptoms. Has had headache, diarrhea, loss of taste, fever, rhinorrhea, sore throat, post nasal drip. Denies cough. Chills and body aches. Tmax below 101. Frequent diarrhea without melena or hematochezia. Has had abdominal cramping associated with diarrhea. Nausea without vomiting. Patient has history of asthma which is currently not well controlled due to lack of PCP. She has been on maintenance inhalers in the past but has not had refills for awhile. She has been using albuterol every day, on average about twice a day. This usually occurs at night time, and can wake up about 2x/wk due to cough/wheezing. Last needed steroids 2 months ago. Last admitted 2017 for flu causing respiratory distress. Positive COVID exposure 10/08/2018.     Past Medical History:  Diagnosis Date  . Anemia    low iron  . Asthma   . Complication of anesthesia    bp dropped in middle of last c-section and started with n/v  . GERD (gastroesophageal reflux disease)    during pregnancy  . Headache    "couple days/week" (10/28/2015)  . Hyperthyroidism    not on medication  . PE (pulmonary embolism) 11/2011  . PONV (postoperative nausea and vomiting)   . Pregnancy induced hypertension   . Preterm labor     Patient Active Problem List   Diagnosis Date Noted  . Status post repeat low transverse cesarean section 07/27/2017  . Obesity affecting pregnancy in third trimester   . Poor fetal growth affecting management of mother in third trimester   . Previous cesarean delivery affecting pregnancy   . Previous pregnancy complicated by pregnancy-induced hypertension in third trimester, antepartum   . [redacted] weeks gestation of pregnancy   . Unwanted  fertility 06/06/2017  . IUGR (intrauterine growth restriction) in prior pregnancy, pregnant 03/06/2017  . Vitamin D deficiency 02/17/2017  . Hx of preeclampsia, prior pregnancy, currently pregnant 02/09/2017  . History of preterm delivery 02/09/2017  . History of asthma   . Normocytic anemia 02/06/2016  . Family history of systemic lupus erythematosus (SLE) in mother 02/19/2014  . Hyperthyroidism 02/19/2014  . Previous cesarean section 08/08/2012  . Hx pulmonary embolism 07/24/2012  . Supervision of high-risk pregnancy 07/24/2012    Past Surgical History:  Procedure Laterality Date  . CESAREAN SECTION  2008; 2014  . CESAREAN SECTION N/A 07/27/2017   Procedure: REPEAT CESAREAN SECTION;  Surgeon: Reva Bores, MD;  Location[facility] BIRTHING SUITES;  Service: Obstetrics;  Laterality: N/A;  . FRACTURE SURGERY    . ORIF ANKLE FRACTURE Right 10/28/2015  . ORIF ANKLE FRACTURE Right 10/28/2015   Procedure: OPEN REDUCTION INTERNAL FIXATION (ORIF) RIGHT ANKLE FRACTURE;  Surgeon: Yolonda Kida, MD;  Location: [facility] OR;  Service: Orthopedics;  Laterality: Right;  . TUBAL LIGATION Bilateral 07/27/2017   Procedure: BILATERAL TUBAL LIGATION;  Surgeon: Reva Bores, MD;  Location: [facility] BIRTHING SUITES;  Service: Obstetrics;  Laterality: Bilateral;    OB History    Gravida  3   Para  3   Term  1   Preterm  2   AB      Living  3     SAB  TAB      Ectopic      Multiple  0   Live Births  3            Home Medications    Prior to Admission medications   Medication Sig Start Date End Date Taking? Authorizing Provider  albuterol (PROAIR HFA) 108 (90 Base) MCG/ACT inhaler INHALE 2 PUFFS BY MOUTH EVERY 6 HOURS AS NEEDED FOR WHEEZE OR SHORTNESS OF BREATH 12/18/18   Cathie HoopsYu, Ermon Sagan V, PA-C  dicyclomine (BENTYL) 20 MG tablet Take 1 tablet (20 mg total) by mouth 2 (two) times daily. 12/18/18   Cathie HoopsYu, Niharika Savino V, PA-C  fluticasone (FLONASE) 50 MCG/ACT nasal spray Place 1 spray into both nostrils  daily. 10/23/18   Hall-Potvin, [place], PA-C  fluticasone (FLOVENT HFA) 44 MCG/ACT inhaler Inhale 2 puffs into the lungs 2 (two) times daily. 12/18/18   Cathie HoopsYu, Shenay Torti V, PA-C  ondansetron (ZOFRAN ODT) 4 MG disintegrating tablet Take 1 tablet (4 mg total) by mouth every 8 (eight) hours as needed for nausea or vomiting. 12/18/18   Cathie HoopsYu, Dyllin Gulley V, PA-C  enoxaparin (LOVENOX) 30 MG/0.3ML injection Inject 0.4 mLs (40 mg total) into the skin every 12 (twelve) hours. 07/31/17 07/19/18  Marylene LandKooistra, Kathryn Lorraine, CNM  ferrous sulfate (FERROUSUL) 325 (65 FE) MG tablet Take 1 tablet (325 mg total) by mouth daily with breakfast. Patient not taking: Reported on 07/27/2017 02/21/17 07/19/18  Leftwich-Kirby, Wilmer FloorLisa A, CNM  furosemide (LASIX) 40 MG tablet Take 1 tablet (40 mg total) by mouth daily. Patient not taking: Reported on 08/24/2017 08/07/17 07/19/18  Marylene LandKooistra, Kathryn Lorraine, CNM  glycopyrrolate (ROBINUL) 1 MG tablet Take 1 tablet (1 mg total) by mouth 3 (three) times daily. Patient not taking: Reported on 06/27/2017 02/09/17 07/19/18  Roe Coombsenney, Rachelle A, CNM  pantoprazole (PROTONIX) 40 MG tablet TAKE 1 TABLET BY MOUTH EVERY DAY Patient not taking: Reported on 08/24/2017 08/09/17 07/19/18  Adam PhenixArnold, James G, MD    Family History Family History  Problem Relation Age of Onset  . Hypertension Maternal Grandfather   . Hypertension Paternal Grandmother   . Cancer Paternal Grandmother   . Hyperlipidemia Paternal Grandmother   . Hypertension Father   . Cancer Sister        skin  . Cancer Maternal Grandmother   . Rheum arthritis Mother   . Lupus Mother   . Hypothyroidism Maternal Aunt   . Lupus Other     Social History Social History   Tobacco Use  . Smoking status: Never Smoker  . Smokeless tobacco: Never Used  Substance Use Topics  . Alcohol use: No  . Drug use: No     Allergies   Banana and Cefixime   Review of Systems Review of Systems  Reason unable to perform ROS: See HPI as above.     Physical Exam  Triage Vital Signs ED Triage Vitals [12/18/18 1215]  Enc Vitals Group     BP 132/77     Pulse Rate 82     Resp 18     Temp 98 F (36.7 C)     Temp Source Oral     SpO2 98 %     Weight      Height      Head Circumference      Peak Flow      Pain Score 6     Pain Loc      Pain Edu?      Excl. in [place]?    No data found.  Updated Vital Signs BP 132/77 (BP Location: Left Arm)   Pulse 82   Temp 98 F (36.7 C) (Oral)   Resp 18   LMP 12/11/2018   SpO2 98%   Physical Exam Constitutional:      General: She is not in acute distress.    Appearance: Normal appearance. She is not ill-appearing, toxic-appearing or diaphoretic.  HENT:     Head: Normocephalic and atraumatic.     Mouth/Throat:     Mouth: Mucous membranes are moist.     Pharynx: Oropharynx is clear. Uvula midline.  Cardiovascular:     Rate and Rhythm: Normal rate and regular rhythm.     Heart sounds: Normal heart sounds. No murmur. No friction rub. No gallop.   Pulmonary:     Effort: Pulmonary effort is normal. No accessory muscle usage, prolonged expiration, respiratory distress or retractions.     Comments: Speaking in full sentences without difficulty. Lungs clear to auscultation without adventitious lung sounds. Good air movement. Musculoskeletal:     Cervical back: Normal range of motion and neck supple.  Skin:    General: Skin is warm and dry.  Neurological:     General: No focal deficit present.     Mental Status: She is alert and oriented to person, place, and time.    UC Treatments / Results  Labs (all labs ordered are listed, but only abnormal results are displayed) Labs Reviewed  NOVEL CORONAVIRUS, NAA    EKG   Radiology No results found.  Procedures Procedures (including critical care time)  Medications Ordered in UC Medications - No data to display  Initial Impression / Assessment and Plan / UC Course  I have reviewed the triage vital signs and the nursing notes.  Pertinent labs &  imaging results that were available during my care of the patient were reviewed by me and considered in my medical decision making (see chart for details).    COVID PCR test ordered. Patient to quarantine until testing results return. No alarming signs on exam.  Patient speaking in full sentences without respiratory distress. Will refill albuterol. Given patient is between PCP at this time with current asthma symptoms, will restart flovent for maintenance. Symptomatic treatment discussed.  Push fluids.  Return precautions given.  Patient expresses understanding and agrees to plan.  Final Clinical Impressions(s) / UC Diagnoses   Final diagnoses:  Nonintractable headache, unspecified chronicity pattern, unspecified headache type  Exposure to COVID-19 virus   ED Prescriptions    Medication Sig Dispense Auth. Provider   albuterol (PROAIR HFA) 108 (90 Base) MCG/ACT inhaler INHALE 2 PUFFS BY MOUTH EVERY 6 HOURS AS NEEDED FOR WHEEZE OR SHORTNESS OF BREATH 18 g Timberlee Roblero V, PA-C   fluticasone (FLOVENT HFA) 44 MCG/ACT inhaler Inhale 2 puffs into the lungs 2 (two) times daily. 1 Inhaler Kahner Yanik V, PA-C   ondansetron (ZOFRAN ODT) 4 MG disintegrating tablet Take 1 tablet (4 mg total) by mouth every 8 (eight) hours as needed for nausea or vomiting. 20 tablet Kathaleya Mcduffee V, PA-C   dicyclomine (BENTYL) 20 MG tablet Take 1 tablet (20 mg total) by mouth 2 (two) times daily. 20 tablet Ok Edwards, PA-C     PDMP not reviewed this encounter.   Ok Edwards, PA-C 12/18/18 [phone]

## 2018-12-20 LAB — NOVEL CORONAVIRUS, NAA: SARS-CoV-2, NAA: NOT DETECTED

## 2019-01-09 ENCOUNTER — Ambulatory Visit: Payer: BC Managed Care – PPO

## 2019-01-09 NOTE — Progress Notes (Deleted)
Patient ID: Debra Horn, female   DOB: June 28, 1985, 34 y.o.   MRN: [id number] Virtual Visit via Telephone Note  I connected with Debra Horn on 01/09/19 at  4:00 PM EST by telephone and verified that I am speaking with the correct person using two identifiers.   Consent:  I discussed the limitations, risks, security and privacy concerns of performing an evaluation and management service by telephone and the availability of in person appointments. I also discussed with the patient that there may be a patient responsible charge related to this service. The patient expressed understanding and agreed to proceed.  Location of patient:  Location of provider:  Persons participating in the televisit with the patient.       History of Present Illness:    Observations/Objective:   Assessment and Plan:   Follow Up Instructions:    I discussed the assessment and treatment plan with the patient. The patient was provided an opportunity to ask questions and all were answered. The patient agreed with the plan and demonstrated an understanding of the instructions.   The patient was advised to call back or seek an in-person evaluation if the symptoms worsen or if the condition fails to improve as anticipated.  I provided *** minutes of non-face-to-face time during this encounter  including  median intraservice time , review of notes, labs, imaging, medications  and explaining diagnosis and management to the patient .    Shan Levans, MD

## 2019-02-23 ENCOUNTER — Encounter: Payer: Self-pay | Admitting: Emergency Medicine

## 2019-02-23 ENCOUNTER — Other Ambulatory Visit: Payer: Self-pay

## 2019-02-23 ENCOUNTER — Ambulatory Visit
Admission: EM | Admit: 2019-02-23 | Discharge: 2019-02-23 | Disposition: A | Discharge status: Home / Self Care | Payer: BC Managed Care – PPO | Attending: Physician Assistant | Admitting: Physician Assistant

## 2019-02-23 DIAGNOSIS — Z20822 Contact with and (suspected) exposure to covid-19: Secondary | ICD-10-CM

## 2019-02-23 DIAGNOSIS — R519 Headache, unspecified: Secondary | ICD-10-CM | POA: Diagnosis not present

## 2019-02-23 MED ORDER — TIZANIDINE HCL 2 MG PO TABS: 2.0000 mg | ORAL_TABLET | Freq: Three times a day (TID) | ORAL | 0 refills | Status: DC | PRN

## 2019-02-23 NOTE — ED Triage Notes (Signed)
Pt here for covid testing as multiple family members are positive; pt last exposed to them 1 week ago; pt sts some HA as only sx

## 2019-02-23 NOTE — ED Provider Notes (Signed)
[facility]    CSN: [id number] Arrival date & time: 02/23/19  0940      History   Chief Complaint Chief Complaint  Patient presents with  . Appointment    930  . Headache    HPI Debra Horn is a 34 y.o. female.   34 year old female comes in for 1 week history of headache, rhinorrhea. Multiple positive COVID contact 1.5 week ago. Left side headache, pounding/pressure, photophobia without phonophobia. States also with left sided neck pain, which seems to start prior to headache onset. Denies cough, sore throat. Denies fever, chills, body aches. Denies abdominal pain, nausea, vomiting, diarrhea. Denies shortness of breath, loss of taste/smell. Tylenol with temporary relief.      Past Medical History:  Diagnosis Date  . Anemia    low iron  . Asthma   . Complication of anesthesia    bp dropped in middle of last c-section and started with n/v  . GERD (gastroesophageal reflux disease)    during pregnancy  . Headache    "couple days/week" (10/28/2015)  . Hyperthyroidism    not on medication  . PE (pulmonary embolism) 11/2011  . PONV (postoperative nausea and vomiting)   . Pregnancy induced hypertension   . Preterm labor     Patient Active Problem List   Diagnosis Date Noted  . Status post repeat low transverse cesarean section 07/27/2017  . Obesity affecting pregnancy in third trimester   . Poor fetal growth affecting management of mother in third trimester   . Previous cesarean delivery affecting pregnancy   . Previous pregnancy complicated by pregnancy-induced hypertension in third trimester, antepartum   . [redacted] weeks gestation of pregnancy   . Unwanted fertility 06/06/2017  . IUGR (intrauterine growth restriction) in prior pregnancy, pregnant 03/06/2017  . Vitamin D deficiency 02/17/2017  . Hx of preeclampsia, prior pregnancy, currently pregnant 02/09/2017  . History of preterm delivery 02/09/2017  . History of asthma   . Normocytic anemia  02/06/2016  . Family history of systemic lupus erythematosus (SLE) in mother 02/19/2014  . Hyperthyroidism 02/19/2014  . Previous cesarean section 08/08/2012  . Hx pulmonary embolism 07/24/2012  . Supervision of high-risk pregnancy 07/24/2012    Past Surgical History:  Procedure Laterality Date  . CESAREAN SECTION  2008; 2014  . CESAREAN SECTION N/A 07/27/2017   Procedure: REPEAT CESAREAN SECTION;  Surgeon: Reva Bores, MD;  Location: [facility] BIRTHING SUITES;  Service: Obstetrics;  Laterality: N/A;  . FRACTURE SURGERY    . ORIF ANKLE FRACTURE Right 10/28/2015  . ORIF ANKLE FRACTURE Right 10/28/2015   Procedure: OPEN REDUCTION INTERNAL FIXATION (ORIF) RIGHT ANKLE FRACTURE;  Surgeon: Yolonda Kida, MD;  Location: [facility] OR;  Service: Orthopedics;  Laterality: Right;  . TUBAL LIGATION Bilateral 07/27/2017   Procedure: BILATERAL TUBAL LIGATION;  Surgeon: Reva Bores, MD;  Location: [facility] BIRTHING SUITES;  Service: Obstetrics;  Laterality: Bilateral;    OB History    Gravida  3   Para  3   Term  1   Preterm  2   AB      Living  3     SAB      TAB      Ectopic      Multiple  0   Live Births  3            Home Medications    Prior to Admission medications   Medication Sig Start Date End Date Taking? Authorizing Provider  albuterol (PROAIR HFA) 108 (90 Base) MCG/ACT inhaler INHALE 2 PUFFS BY MOUTH EVERY 6 HOURS AS NEEDED FOR WHEEZE OR SHORTNESS OF BREATH 12/18/18   Tasia Catchings, Tallulah Hosman V, PA-C  dicyclomine (BENTYL) 20 MG tablet Take 1 tablet (20 mg total) by mouth 2 (two) times daily. Patient not taking: Reported on 02/23/2019 12/18/18   Ok Edwards, PA-C  fluticasone [facility]) 50 MCG/ACT nasal spray Place 1 spray into both nostrils daily. 10/23/18   Hall-Potvin, [place], PA-C  fluticasone (FLOVENT HFA) 44 MCG/ACT inhaler Inhale 2 puffs into the lungs 2 (two) times daily. 12/18/18   Tasia Catchings, Calyssa Zobrist V, PA-C  ondansetron (ZOFRAN ODT) 4 MG disintegrating tablet Take 1 tablet (4 mg total) by  mouth every 8 (eight) hours as needed for nausea or vomiting. Patient not taking: Reported on 02/23/2019 12/18/18   Ok Edwards, PA-C  tiZANidine (ZANAFLEX) 2 MG tablet Take 1 tablet (2 mg total) by mouth every 8 (eight) hours as needed for muscle spasms. 02/23/19   Tasia Catchings, Imagene Boss V, PA-C  enoxaparin (LOVENOX) 30 MG/0.3ML injection Inject 0.4 mLs (40 mg total) into the skin every 12 (twelve) hours. 07/31/17 07/19/18  Starr Lake, CNM  ferrous sulfate (FERROUSUL) 325 (65 FE) MG tablet Take 1 tablet (325 mg total) by mouth daily with breakfast. Patient not taking: Reported on 07/27/2017 02/21/17 07/19/18  Leftwich-Kirby, Kathie Dike, CNM  furosemide (LASIX) 40 MG tablet Take 1 tablet (40 mg total) by mouth daily. Patient not taking: Reported on 08/24/2017 08/07/17 07/19/18  Starr Lake, CNM  glycopyrrolate (ROBINUL) 1 MG tablet Take 1 tablet (1 mg total) by mouth 3 (three) times daily. Patient not taking: Reported on 06/27/2017 02/09/17 07/19/18  Morene Crocker, CNM  pantoprazole (PROTONIX) 40 MG tablet TAKE 1 TABLET BY MOUTH EVERY DAY Patient not taking: Reported on 08/24/2017 08/09/17 07/19/18  Woodroe Mode, MD    Family History Family History  Problem Relation Age of Onset  . Hypertension Maternal Grandfather   . Hypertension Paternal Grandmother   . Cancer Paternal Grandmother   . Hyperlipidemia Paternal Grandmother   . Hypertension Father   . Cancer Sister        skin  . Cancer Maternal Grandmother   . Rheum arthritis Mother   . Lupus Mother   . Hypothyroidism Maternal Aunt   . Lupus Other     Social History Social History   Tobacco Use  . Smoking status: Never Smoker  . Smokeless tobacco: Never Used  Substance Use Topics  . Alcohol use: No  . Drug use: No     Allergies   Banana and Cefixime   Review of Systems Review of Systems  Reason unable to perform ROS: See HPI as above.     Physical Exam Triage Vital Signs ED Triage Vitals [02/23/19 1028]  Enc  Vitals Group     BP (!) 143/88     Pulse Rate 80     Resp 18     Temp 98.3 F (36.8 C)     Temp Source Oral     SpO2 98 %     Weight      Height      Head Circumference      Peak Flow      Pain Score 2     Pain Loc      Pain Edu?      Excl. in [place]?    No data found.  Updated Vital Signs BP (!) 143/88 (BP Location: Right Arm)  Pulse 80   Temp 98.3 F (36.8 C) (Oral)   Resp 18   SpO2 98%   Physical Exam Constitutional:      General: She is not in acute distress.    Appearance: Normal appearance. She is not ill-appearing, toxic-appearing or diaphoretic.  HENT:     Head: Normocephalic and atraumatic.     Nose:     Right Sinus: No maxillary sinus tenderness or frontal sinus tenderness.     Left Sinus: No maxillary sinus tenderness or frontal sinus tenderness.     Mouth/Throat:     Mouth: Mucous membranes are moist.     Pharynx: Oropharynx is clear. Uvula midline.  Cardiovascular:     Rate and Rhythm: Normal rate and regular rhythm.     Heart sounds: Normal heart sounds. No murmur. No friction rub. No gallop.   Pulmonary:     Effort: Pulmonary effort is normal. No accessory muscle usage, prolonged expiration, respiratory distress or retractions.     Comments: Lungs clear to auscultation without adventitious lung sounds. Musculoskeletal:     Cervical back: Normal range of motion and neck supple. No pain with movement, spinous process tenderness or muscular tenderness.  Neurological:     General: No focal deficit present.     Mental Status: She is alert and oriented to person, place, and time.      UC Treatments / Results  Labs (all labs ordered are listed, but only abnormal results are displayed) Labs Reviewed  NOVEL CORONAVIRUS, NAA    EKG   Radiology No results found.  Procedures Procedures (including critical care time)  Medications Ordered in UC Medications - No data to display  Initial Impression / Assessment and Plan / UC Course  I have  reviewed the triage vital signs and the nursing notes.  Pertinent labs & imaging results that were available during my care of the patient were reviewed by me and considered in my medical decision making (see chart for details).    COVID PCR test ordered. Patient to quarantine until testing results return. No alarming signs on exam.  Patient speaking in full sentences without respiratory distress. Will try tizanidine for possible tension headache. Symptomatic treatment discussed.  Push fluids.  Return precautions given.  Patient expresses understanding and agrees to plan.  Final Clinical Impressions(s) / UC Diagnoses   Final diagnoses:  Acute intractable headache, unspecified headache type  Exposure to COVID-19 virus    ED Prescriptions    Medication Sig Dispense Auth. Provider   tiZANidine (ZANAFLEX) 2 MG tablet Take 1 tablet (2 mg total) by mouth every 8 (eight) hours as needed for muscle spasms. 15 tablet Belinda Fisher, PA-C     PDMP not reviewed this encounter.   Belinda Fisher, PA-C 02/23/19 1413

## 2019-02-23 NOTE — Discharge Instructions (Signed)
COVID PCR testing ordered. I would like you to quarantine until testing results. Tizanidine for neck pain that could be causing headaches. Flonase for nasal congestion. Tylenol/motrin for pain and fever. Keep hydrated, urine should be clear to pale yellow in color. If experiencing shortness of breath, trouble breathing, go to the emergency department for further evaluation needed.

## 2019-02-24 LAB — NOVEL CORONAVIRUS, NAA: SARS-CoV-2, NAA: NOT DETECTED

## 2019-03-25 ENCOUNTER — Telehealth: Payer: BC Managed Care – PPO

## 2019-03-25 ENCOUNTER — Ambulatory Visit (INDEPENDENT_AMBULATORY_CARE_PROVIDER_SITE_OTHER)
Admission: RE | Admit: 2019-03-25 | Discharge: 2019-03-25 | Disposition: A | Discharge status: Home / Self Care | Payer: BC Managed Care – PPO | Source: Ambulatory Visit

## 2019-03-25 ENCOUNTER — Other Ambulatory Visit: Payer: Self-pay

## 2019-03-25 DIAGNOSIS — Z760 Encounter for issue of repeat prescription: Secondary | ICD-10-CM | POA: Diagnosis not present

## 2019-03-25 DIAGNOSIS — Z98891 History of uterine scar from previous surgery: Secondary | ICD-10-CM

## 2019-03-25 MED ORDER — ALBUTEROL SULFATE HFA 108 (90 BASE) MCG/ACT IN AERS: 2.0000 | INHALATION_SPRAY | Freq: Four times a day (QID) | RESPIRATORY_TRACT | 0 refills | Status: DC | PRN

## 2019-03-25 NOTE — Discharge Instructions (Addendum)
The refill for your albuterol inhaler has been sent to your pharmacy.  Please establish a primary care provider such as the one listed for additional refills.

## 2019-03-25 NOTE — ED Provider Notes (Signed)
Virtual Visit via Video Note:  [name]  initiated request for Telemedicine visit with [facility] Urgent Care team. I connected with Jan A Cronk  on 03/25/2019 at 3:47 PM  for a synchronized telemedicine visit using a video enabled HIPPA compliant telemedicine application. I verified that I am speaking with Nalany A Gau  using two identifiers. Mickie Bail, NP  was physically located in a [facility] Urgent care site and Ovella A Palermo was located at a different location.   The limitations of evaluation and management by telemedicine as well as the availability of in-person appointments were discussed. Patient was informed that she  may incur a bill ( including co-pay) for this virtual visit encounter. Dynastie A Hovanec  expressed understanding and gave verbal consent to proceed with virtual visit.     History of Present Illness:Debra Horn  is a 34 y.o. female presents with request for a refill on her albuterol inhaler.  She states she used her last dose yesterday evening; she does not have a PCP.  She was seen on 11/25/2018 for a refill on the same medication.  She denies cough, wheezing, shortness of breath or other symptoms.     Allergies  Allergen Reactions  . Banana Anaphylaxis  . Cefixime Rash     Past Medical History:  Diagnosis Date  . Anemia    low iron  . Asthma   . Complication of anesthesia    bp dropped in middle of last c-section and started with n/v  . GERD (gastroesophageal reflux disease)    during pregnancy  . Headache    "couple days/week" (10/28/2015)  . Hyperthyroidism    not on medication  . PE (pulmonary embolism) 11/2011  . PONV (postoperative nausea and vomiting)   . Pregnancy induced hypertension   . Preterm labor      Social History   Tobacco Use  . Smoking status: Never Smoker  . Smokeless tobacco: Never Used  Substance Use Topics  . Alcohol use: No  . Drug use: No   ROS: as stated in HPI.  All other systems reviewed  and negative.      Observations/Objective: Physical Exam  VITALS: Patient denies fever. GENERAL: Alert, well-appearing, no acute distress. HEENT: Atraumatic. NECK: Normal movements of the head and neck. CARDIOPULMONARY: No increased WOB. Speaking in clear sentences. I:E ratio WNL.  MS: Moves all visible extremities without noticeable abnormality. PSYCH: Pleasant and cooperative, well-groomed. Speech normal rate and rhythm. Affect is appropriate. Insight and judgement are appropriate. Attention is focused, linear, and appropriate.  NEURO: CN grossly intact. Oriented as arrived to appointment on time with no prompting. Moves both UE equally.  SKIN: No obvious lesions, wounds, erythema, or cyanosis noted on face or hands.   Assessment and Plan:    ICD-10-CM   1. Encounter for medication refill  Z76.0   2. Previous cesarean section  Z98.891        Follow Up Instructions: Refill given for albuterol inhaler.  Discussed with patient that she needs to establish a PCP for additional refills.  She agrees to plan of care.      I discussed the assessment and treatment plan with the patient. The patient was provided an opportunity to ask questions and all were answered. The patient agreed with the plan and demonstrated an understanding of the instructions.   The patient was advised to call back or seek an in-person evaluation if the symptoms worsen or if the condition fails to  improve as anticipated.      Sharion Balloon, NP  03/25/2019 3:47 PM         Sharion Balloon, NP 03/25/19 1547

## 2019-04-16 DIAGNOSIS — R062 Wheezing: Secondary | ICD-10-CM | POA: Diagnosis not present

## 2019-04-16 DIAGNOSIS — R05 Cough: Secondary | ICD-10-CM | POA: Diagnosis not present

## 2019-04-16 DIAGNOSIS — J45909 Unspecified asthma, uncomplicated: Secondary | ICD-10-CM | POA: Diagnosis not present

## 2019-04-21 ENCOUNTER — Telehealth: Payer: BC Managed Care – PPO | Admitting: Emergency Medicine

## 2019-04-21 DIAGNOSIS — R0602 Shortness of breath: Secondary | ICD-10-CM

## 2019-04-21 DIAGNOSIS — R6889 Other general symptoms and signs: Secondary | ICD-10-CM

## 2019-04-21 NOTE — Progress Notes (Signed)
Based on what you shared with me, I feel your condition warrants further evaluation and I recommend that you be seen for a face to face office visit.   NOTE: If you entered your credit card information for this eVisit, you will not be charged. You may see a "hold" on your card for the $35 but that hold will drop off and you will not have a charge processed.   If you are having a true medical emergency please call 911.      For an urgent face to face visit, [facility] has five urgent care centers for your convenience:      NEW:  [facility] [place] Get Driving Directions [phone] [address] [place], [place] [address] . 10 am - 6pm Monday - Friday    [facility] Get Driving Directions [phone] [address] [place], [place] [address] . 10 am to 8 pm Monday-Friday . 12 pm to 8 pm Saturday-Sunday     [facility] Get Driving Directions [phone] [address] [place], [place] [address] . 8 am to 8 pm Monday-Friday . 9 am to 6 pm Saturday . 11 am to 6 pm Sunday     [facility] [place] Get Driving Directions  [phone] [address] [place], [place] [address] . 8 am to 8 pm Monday-Friday . 8 am to 4 pm Saturday-Sunday   [facility] [place] Get Driving Directions [phone] [address] [place], [place] [address] . 12 pm to 6 pm Monday-Friday      Your e-visit answers were reviewed by a board certified advanced clinical practitioner to complete your personal care plan.  Thank you for using e-Visits.    Greater than 5 but less than 10 minutes spent researching, coordinating, and implementing care for this patient today  

## 2019-05-08 ENCOUNTER — Other Ambulatory Visit: Payer: Self-pay

## 2019-05-08 ENCOUNTER — Ambulatory Visit (INDEPENDENT_AMBULATORY_CARE_PROVIDER_SITE_OTHER)
Admission: RE | Admit: 2019-05-08 | Discharge: 2019-05-08 | Disposition: A | Discharge status: Home / Self Care | Payer: BC Managed Care – PPO | Source: Ambulatory Visit

## 2019-05-08 DIAGNOSIS — Z760 Encounter for issue of repeat prescription: Secondary | ICD-10-CM | POA: Diagnosis not present

## 2019-05-08 DIAGNOSIS — J4540 Moderate persistent asthma, uncomplicated: Secondary | ICD-10-CM

## 2019-05-08 MED ORDER — ALBUTEROL SULFATE HFA 108 (90 BASE) MCG/ACT IN AERS: 2.0000 | INHALATION_SPRAY | Freq: Four times a day (QID) | RESPIRATORY_TRACT | 1 refills | Status: DC | PRN

## 2019-05-08 MED ORDER — FLOVENT HFA 44 MCG/ACT IN AERO: 2.0000 | INHALATION_SPRAY | Freq: Two times a day (BID) | RESPIRATORY_TRACT | 0 refills | Status: DC

## 2019-05-08 MED ORDER — AZELASTINE HCL 0.1 % NA SOLN: 2.0000 | Freq: Two times a day (BID) | NASAL | 0 refills | Status: DC

## 2019-05-08 NOTE — ED Provider Notes (Signed)
Virtual Visit via Video Note:  Debra Horn  initiated request for Telemedicine visit with [facility] team. I connected with [name]  on 05/08/2019 at 1:26 PM  for a synchronized telemedicine visit using a video enabled HIPPA compliant telemedicine application. I verified that I am speaking with Debra Horn  using two identifiers.  [name], PA-C  was physically located in a [facility] Urgent care site and [name] was located at a different location.   The limitations of evaluation and management by telemedicine as well as the availability of in-person appointments were discussed. Patient was informed that she  may incur a bill ( including co-pay) for this virtual visit encounter. [name]  expressed understanding and gave verbal consent to proceed with virtual visit.    History of Present Illness:Debra Horn  is a 34 y.o. female presents with medicine refill for inhalers. History of asthma was on flovent for maintenance with decrease of exacerbations. However, ran out 1-2 months ago. She has PCP appointment 05/29/2019 for establishment.  She started having URI symptoms 2 weeks ago that required increase use of albuterol, and therefore ran out of medicines. Still with residual mild congestion. Denies current cough. Shortness of breath much improved, worse at night. Occasionally wakes up at night coughing. Denies fevers. Still able to do activity at home.   Past Medical History:  Diagnosis Date  . Anemia    low iron  . Asthma   . Complication of anesthesia    bp dropped in middle of last c-section and started with n/v  . GERD (gastroesophageal reflux disease)    during pregnancy  . Headache    "couple days/week" (10/28/2015)  . Hyperthyroidism    not on medication  . PE (pulmonary embolism) 11/2011  . PONV (postoperative nausea and vomiting)   . Pregnancy induced hypertension   . Preterm labor     Allergies  Allergen Reactions  . Banana  Anaphylaxis  . Cefixime Rash        Observations/Objective: General: Well appearing, nontoxic, no acute distress. Sitting comfortably. Head: Normocephalic, atraumatic Eye: No conjunctival injection, eyelid swelling. EOMI ENT: Mucus membranes moist, no lip cracking. No obvious nasal drainage. Pulm: Speaking in full sentences without difficulty. Normal effort. No respiratory distress, accessory muscle use. Neuro: Normal mental status. Alert and oriented x 3.  Assessment and Plan: Will refill flovent and albuterol. Discussed adding on azelastine for post nasal drip. Will need in person evaluation if current symptoms worsen. Otherwise, to follow up with PCP as scheduled for further evaluation and management needed.   Follow Up Instructions:    I discussed the assessment and treatment plan with the patient. The patient was provided an opportunity to ask questions and all were answered. The patient agreed with the plan and demonstrated an understanding of the instructions.   The patient was advised to call back or seek an in-person evaluation if the symptoms worsen or if the condition fails to improve as anticipated.  I provided 10 minutes of non-face-to-face time during this encounter.    Belinda Fisher, PA-C  05/08/2019 1:26 PM     Belinda Fisher, PA-C 05/08/19 1339

## 2019-05-08 NOTE — Discharge Instructions (Addendum)
Albuterol, flovent refilled. Restart flonase, and can add on azelastine for symptomatic management. Follow up with PCP as scheduled for further management needed. Follow up for in person visit if symptoms worsening.

## 2019-05-19 ENCOUNTER — Ambulatory Visit (INDEPENDENT_AMBULATORY_CARE_PROVIDER_SITE_OTHER)
Admission: RE | Admit: 2019-05-19 | Discharge: 2019-05-19 | Disposition: A | Discharge status: Other Acute Inpatient Hospital | Payer: BC Managed Care – PPO | Source: Ambulatory Visit

## 2019-05-19 DIAGNOSIS — H539 Unspecified visual disturbance: Secondary | ICD-10-CM

## 2019-05-19 DIAGNOSIS — R519 Headache, unspecified: Secondary | ICD-10-CM

## 2019-05-19 NOTE — ED Provider Notes (Signed)
Virtual Visit via Video Note:  Debra Horn  initiated request for Telemedicine visit with [facility] Urgent Care team. I connected with Debra Horn  on 05/19/2019 at 5:29 PM  for a synchronized telemedicine visit using a video enabled HIPPA compliant telemedicine application. I verified that I am speaking with Debra Horn  using two identifiers. Debra Balloon, NP  was physically located in a [facility] Urgent care site and Debra Horn was located at a different location.   The limitations of evaluation and management by telemedicine as well as the availability of in-person appointments were discussed. Patient was informed that she  may incur a bill ( including co-pay) for this virtual visit encounter. Debra Horn  expressed understanding and gave verbal consent to proceed with virtual visit.     History of Present Illness:Debra Horn  is a 34 y.o. female presents for evaluation of intermittent headache x several months; Her headache today started last night and she is having "vision changes" "seeing spots".  The headache is in the front and sides of her head; worse with bright light.  She has attempted treatment with Tylenol without relief.  She denies fever, chills, focal weakness, facial droop, speech difficulty, dizziness, chest pain, SOB, abdominal pain, or other symptoms.      Allergies  Allergen Reactions  . Banana Anaphylaxis  . Cefixime Rash     Past Medical History:  Diagnosis Date  . Anemia    low iron  . Asthma   . Complication of anesthesia    bp dropped in middle of last c-section and started with n/v  . GERD (gastroesophageal reflux disease)    during pregnancy  . Headache    "couple days/week" (10/28/2015)  . Hyperthyroidism    not on medication  . PE (pulmonary embolism) 11/2011  . PONV (postoperative nausea and vomiting)   . Pregnancy induced hypertension   . Preterm labor      Social History   Tobacco Use  . Smoking status:  Never Smoker  . Smokeless tobacco: Never Used  Substance Use Topics  . Alcohol use: No  . Drug use: No   ROS: as stated in HPI.  All other systems reviewed and negative.      Observations/Objective: Physical Exam  VITALS: Patient denies fever. GENERAL: Alert, appears well and in no acute distress. HEENT: Atraumatic.  No facial droop noted.  NECK: Normal movements of the head and neck. CARDIOPULMONARY: No increased WOB. Speaking in clear sentences. I:E ratio WNL.  MS: Moves all visible extremities without noticeable abnormality. PSYCH: Pleasant and cooperative, well-groomed. Speech normal rate and rhythm. Affect is appropriate. Insight and judgement are appropriate. Attention is focused, linear, and appropriate.  NEURO: CN grossly intact. Oriented as arrived to appointment on time with no prompting. Moves both UE equally.  SKIN: No obvious lesions, wounds, erythema, or cyanosis noted on face or hands.   Assessment and Plan:    ICD-10-CM   1. Acute nonintractable headache, unspecified headache type  R51.9   2. Visual disturbance  H53.9        Follow Up Instructions: Instructed patient to come to the UC for in-person evaluation.  She agrees to this.      I discussed the assessment and treatment plan with the patient. The patient was provided an opportunity to ask questions and all were answered. The patient agreed with the plan and demonstrated an understanding of the instructions.   The patient was advised  to call back or seek an in-person evaluation if the symptoms worsen or if the condition fails to improve as anticipated.      Mickie Bail, NP  05/19/2019 5:29 PM         Mickie Bail, NP 05/19/19 1729

## 2019-05-19 NOTE — Discharge Instructions (Addendum)
Come to the Urgent Care for in-person evaluation.   

## 2019-05-29 ENCOUNTER — Encounter: Payer: Self-pay | Admitting: Physician Assistant

## 2019-05-29 ENCOUNTER — Ambulatory Visit (INDEPENDENT_AMBULATORY_CARE_PROVIDER_SITE_OTHER): Payer: BC Managed Care – PPO | Admitting: Physician Assistant

## 2019-05-29 ENCOUNTER — Other Ambulatory Visit: Payer: Self-pay

## 2019-05-29 VITALS — BP 124/84 | HR 87 | Temp 98.3°F | Ht 62.25 in | Wt 245.3 lb

## 2019-05-29 DIAGNOSIS — Z7689 Persons encountering health services in other specified circumstances: Secondary | ICD-10-CM | POA: Diagnosis not present

## 2019-05-29 DIAGNOSIS — G43101 Migraine with aura, not intractable, with status migrainosus: Secondary | ICD-10-CM | POA: Diagnosis not present

## 2019-05-29 NOTE — Progress Notes (Signed)
New Patient Office Visit  Subjective:  Patient ID: Debra Horn, female    DOB: [birth year]  Age: 34 y.o. MRN: [id number]  CC:  Chief Complaint  Patient presents with  . Establish Care    HPI Debra Horn presents to establish care and with complaint of migraine headaches. Pt reports history of migraines and states for the past 3 months she gets a headache at least once weekly. She describes her headaches as throbbing and unilateral- R side. She has several triggers such as lack of sleep and stress. Her headaches improve with silence, resting in a dark room and cold compresses. Reports she gets an aura- blurry vision, dizziness, and lightheadedness, before headache onset. She usually checks her blood pressure when she doesn't feel well and its usually wnl's- 132/75. States migraines started in her adolescence. 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 Sometimes she has nausea, denies vomiting. States she was really sick x 8 days in April with URI symptoms and a severe migraine that lasted about 2 weeks and eventually subsided with Tylenol. States she went to [facility] for evaluation and treatment. Per patient, employer is requesting FMLA forms for when she was sick from 04/21/19-04/30/19 and they will not accept forms filled out by UC, it has to be from PCP.  Past Medical History:  Diagnosis Date  . Anemia    low iron  . Asthma   . Complication of anesthesia    bp dropped in middle of last c-section and started with n/v  . GERD (gastroesophageal reflux disease)    during pregnancy  . Headache    "couple days/week" (10/28/2015)  . Hyperthyroidism    not on medication  . PE (pulmonary embolism) 11/2011  . PONV (postoperative nausea and vomiting)   . Pregnancy induced hypertension   . Preterm labor     Past Surgical History:  Procedure Laterality Date  . CESAREAN SECTION  2008; 2014  . CESAREAN  SECTION N/A 07/27/2017   Procedure: REPEAT CESAREAN SECTION;  Surgeon: Reva Bores, MD;  Location: [facility] BIRTHING SUITES;  Service: Obstetrics;  Laterality: N/A;  . FRACTURE SURGERY    . ORIF ANKLE FRACTURE Right 10/28/2015  . ORIF ANKLE FRACTURE Right 10/28/2015   Procedure: OPEN REDUCTION INTERNAL FIXATION (ORIF) RIGHT ANKLE FRACTURE;  Surgeon: Yolonda Kida, MD;  Location: [facility] OR;  Service: Orthopedics;  Laterality: Right;  . TUBAL LIGATION Bilateral 07/27/2017   Procedure: BILATERAL TUBAL LIGATION;  Surgeon: Reva Bores, MD;  Location: [facility] BIRTHING SUITES;  Service: Obstetrics;  Laterality: Bilateral;    Family History  Problem Relation Age of Onset  . Hypertension Maternal Grandfather   . Hypertension Paternal Grandmother   . Cancer Paternal Grandmother   . Hyperlipidemia Paternal Grandmother   . Diabetes Paternal Grandmother   . Hypertension Father   . Diabetes Father   . Cancer Sister        skin  . Cancer Maternal Grandmother        leukemia  . Rheum arthritis Mother   . Lupus Mother   . Hypothyroidism Maternal Aunt   . Lupus Other     Social History   Socioeconomic History  . Marital status: Married    Spouse name: Not on file  . Number of children: 2  . Years of education: 34  . Highest education level: Not on file  Occupational History  .  Occupation: Therapist, art  Tobacco Use  . Smoking status: Never Smoker  . Smokeless tobacco: Never Used  Substance and Sexual Activity  . Alcohol use: No  . Drug use: No  . Sexual activity: Yes    Birth control/protection: None, Surgical  Other Topics Concern  . Not on file  Social History Narrative   Born in [place], [place] and raised in [place], [place]. Currently resides in an apartment with her husband and 2 children. No pets. Fun: Work, sleep   Denies religious beliefs that would effect health care.    Social Determinants of Health   Financial Resource Strain:   . Difficulty of Paying Living Expenses:    Food Insecurity:   . Worried About Charity fundraiser in the Last Year:   . Arboriculturist in the Last Year:   Transportation Needs:   . Film/video editor (Medical):   Marland Kitchen Lack of Transportation (Non-Medical):   Physical Activity:   . Days of Exercise per Week:   . Minutes of Exercise per Session:   Stress:   . Feeling of Stress :   Social Connections:   . Frequency of Communication with Friends and Family:   . Frequency of Social Gatherings with Friends and Family:   . Attends Religious Services:   . Active Member of Clubs or Organizations:   . Attends Archivist Meetings:   Marland Kitchen Marital Status:   Intimate Partner Violence:   . Fear of Current or Ex-Partner:   . Emotionally Abused:   Marland Kitchen Physically Abused:   . Sexually Abused:     ROS Review of Systems  A fourteen system review of systems was performed and found to be positive as per HPI.  Objective:   Today's Vitals: BP 124/84   Pulse 87   Temp 98.3 F (36.8 C) (Oral)   Ht 5' 2.25" (1.581 m)   Wt 245 lb 4.8 oz (111.3 kg)   LMP 05/23/2019 (Exact Date)   SpO2 97% Comment: on RA  BMI 44.51 kg/m   Physical Exam General:  Well Developed, well nourished, appropriate for stated age.  Neuro:  Alert and oriented,  extra-ocular muscles intact  HEENT:  Normocephalic, atraumatic, neck supple, no carotid bruits appreciated  Skin:  no gross rash, warm, pink. Cardiac:  RRR, S1 S2 Respiratory:  ECTA B/L and A/P, Not using accessory muscles, speaking in full sentences- unlabored. Vascular:  Ext warm, no cyanosis apprec.; cap RF less 2 sec. Psych:  No HI/SI, judgement and insight good, Euthymic mood. Full Affect.  Assessment & Plan:   Problem List Items Addressed This Visit    None    Visit Diagnoses    Encounter to establish care    -  Primary   Migraine with aura and with status migrainosus, not intractable          Outpatient Encounter Medications as of 05/29/2019  Medication Sig  . albuterol (VENTOLIN  HFA) 108 (90 Base) MCG/ACT inhaler Inhale 2 puffs into the lungs every 6 (six) hours as needed for wheezing or shortness of breath.  . fluticasone (FLOVENT HFA) 44 MCG/ACT inhaler Inhale 2 puffs into the lungs 2 (two) times daily.  . [DISCONTINUED] azelastine (ASTELIN) 0.1 % nasal spray Place 2 sprays into both nostrils 2 (two) times daily.  . [DISCONTINUED] dicyclomine (BENTYL) 20 MG tablet Take 1 tablet (20 mg total) by mouth 2 (two) times daily. (Patient not taking: Reported on 02/23/2019)  . [DISCONTINUED] enoxaparin (LOVENOX) 30 MG/0.3ML  injection Inject 0.4 mLs (40 mg total) into the skin every 12 (twelve) hours.  . [DISCONTINUED] ferrous sulfate (FERROUSUL) 325 (65 FE) MG tablet Take 1 tablet (325 mg total) by mouth daily with breakfast. (Patient not taking: Reported on 07/27/2017)  . [DISCONTINUED] fluticasone (FLONASE) 50 MCG/ACT nasal spray Place 1 spray into both nostrils daily.  . [DISCONTINUED] furosemide (LASIX) 40 MG tablet Take 1 tablet (40 mg total) by mouth daily. (Patient not taking: Reported on 08/24/2017)  . [DISCONTINUED] glycopyrrolate (ROBINUL) 1 MG tablet Take 1 tablet (1 mg total) by mouth 3 (three) times daily. (Patient not taking: Reported on 06/27/2017)  . [DISCONTINUED] pantoprazole (PROTONIX) 40 MG tablet TAKE 1 TABLET BY MOUTH EVERY DAY (Patient not taking: Reported on 08/24/2017)   No facility-administered encounter medications on file as of 05/29/2019.   Migraine with aura with status migrainosus:  - Discussed with patient management for migraine including indications for prophylaxis therapy. Pt agreeable to starting abortive treatment with triptan so will start rizatriptan. Limit use to no more than 10 days per month to avoid medication overuse and rebound headaches. - Discussed importance of reducing triggers, good sleep hygiene and exercise.  - Follow up in 8 weeks to reassess symptoms and medication therapy.   Follow-up: Return for 8 weeks for migraines; CPE in 3  months; FBW in 1-4 wks.   Mayer Masker, PA-C

## 2019-06-09 MED ORDER — RIZATRIPTAN BENZOATE 10 MG PO TABS: 10.0000 mg | ORAL_TABLET | ORAL | 2 refills | Status: DC | PRN

## 2019-06-28 ENCOUNTER — Ambulatory Visit
Admission: EM | Admit: 2019-06-28 | Discharge: 2019-07-01 | Discharge status: Absent without leave | Payer: BC Managed Care – PPO | Source: Home / Self Care

## 2019-06-28 ENCOUNTER — Other Ambulatory Visit: Payer: Self-pay

## 2019-06-28 ENCOUNTER — Ambulatory Visit
Admission: EM | Admit: 2019-06-28 | Discharge: 2019-06-28 | Disposition: A | Discharge status: Home / Self Care | Payer: BC Managed Care – PPO | Attending: Physician Assistant | Admitting: Physician Assistant

## 2019-06-28 DIAGNOSIS — J4531 Mild persistent asthma with (acute) exacerbation: Secondary | ICD-10-CM | POA: Diagnosis not present

## 2019-06-28 DIAGNOSIS — R05 Cough: Secondary | ICD-10-CM

## 2019-06-28 DIAGNOSIS — R059 Cough, unspecified: Secondary | ICD-10-CM

## 2019-06-28 MED ORDER — ALBUTEROL SULFATE HFA 108 (90 BASE) MCG/ACT IN AERS: 2.0000 | INHALATION_SPRAY | Freq: Four times a day (QID) | RESPIRATORY_TRACT | 0 refills | Status: DC | PRN

## 2019-06-28 MED ORDER — PREDNISONE 50 MG PO TABS: 50.0000 mg | ORAL_TABLET | Freq: Every day | ORAL | 0 refills | Status: DC

## 2019-06-28 MED ORDER — ALBUTEROL SULFATE (2.5 MG/3ML) 0.083% IN NEBU: 2.5000 mg | INHALATION_SOLUTION | Freq: Four times a day (QID) | RESPIRATORY_TRACT | 0 refills | Status: DC | PRN

## 2019-06-28 MED ORDER — IPRATROPIUM-ALBUTEROL 0.5-2.5 (3) MG/3ML IN SOLN: 3.0000 mL | Freq: Four times a day (QID) | RESPIRATORY_TRACT | 0 refills | Status: DC | PRN

## 2019-06-28 NOTE — ED Provider Notes (Signed)
[facility]    CSN: [id number] Arrival date & time: 06/28/19  1417      History   Chief Complaint Chief Complaint  Patient presents with  . Shortness of Breath  . Fatigue    HPI Debra Horn is a 34 y.o. female.   34 year old female comes in for 5 day of URI symptoms. Cough, shortness of breath, fatigued, body aches. States cough has since resolved. Denies fever, chills, body aches. Mild diarrhea with associated abdominal pain. Also with menstrual cycle. Denies nausea, vomiting. Denies loss of taste/smell. Multiple sick contacts. Ran out of albuterol. Still on flovent, flonase.      Past Medical History:  Diagnosis Date  . Anemia    low iron  . Asthma   . Complication of anesthesia    bp dropped in middle of last c-section and started with n/v  . GERD (gastroesophageal reflux disease)    during pregnancy  . Headache    "couple days/week" (10/28/2015)  . Hyperthyroidism    not on medication  . PE (pulmonary embolism) 11/2011  . PONV (postoperative nausea and vomiting)   . Pregnancy induced hypertension   . Preterm labor     Patient Active Problem List   Diagnosis Date Noted  . Status post repeat low transverse cesarean section 07/27/2017  . Obesity affecting pregnancy in third trimester   . Poor fetal growth affecting management of mother in third trimester   . Previous cesarean delivery affecting pregnancy   . Previous pregnancy complicated by pregnancy-induced hypertension in third trimester, antepartum   . [redacted] weeks gestation of pregnancy   . Unwanted fertility 06/06/2017  . IUGR (intrauterine growth restriction) in prior pregnancy, pregnant 03/06/2017  . Vitamin D deficiency 02/17/2017  . Hx of preeclampsia, prior pregnancy, currently pregnant 02/09/2017  . History of preterm delivery 02/09/2017  . History of asthma   . Normocytic anemia 02/06/2016  . Family history of systemic lupus erythematosus (SLE) in mother 02/19/2014  .  Hyperthyroidism 02/19/2014  . Previous cesarean section 08/08/2012  . Hx pulmonary embolism 07/24/2012  . Supervision of high-risk pregnancy 07/24/2012    Past Surgical History:  Procedure Laterality Date  . CESAREAN SECTION  2008; 2014  . CESAREAN SECTION N/A 07/27/2017   Procedure: REPEAT CESAREAN SECTION;  Surgeon: Reva Bores, MD;  Location: [facility] BIRTHING SUITES;  Service: Obstetrics;  Laterality: N/A;  . FRACTURE SURGERY    . ORIF ANKLE FRACTURE Right 10/28/2015  . ORIF ANKLE FRACTURE Right 10/28/2015   Procedure: OPEN REDUCTION INTERNAL FIXATION (ORIF) RIGHT ANKLE FRACTURE;  Surgeon: Yolonda Kida, MD;  Location: [facility] OR;  Service: Orthopedics;  Laterality: Right;  . TUBAL LIGATION Bilateral 07/27/2017   Procedure: BILATERAL TUBAL LIGATION;  Surgeon: Reva Bores, MD;  Location: [facility] BIRTHING SUITES;  Service: Obstetrics;  Laterality: Bilateral;    OB History    Gravida  3   Para  3   Term  1   Preterm  2   AB      Living  3     SAB      TAB      Ectopic      Multiple  0   Live Births  3            Home Medications    Prior to Admission medications   Medication Sig Start Date End Date Taking? Authorizing Provider  albuterol (PROVENTIL) (2.5 MG/3ML) 0.083% nebulizer solution Take 3 mLs (2.5 mg total)  by nebulization every 6 (six) hours as needed for wheezing or shortness of breath. 06/28/19   Cathie Hoops, Brailee Riede V, PA-C  albuterol (VENTOLIN HFA) 108 (90 Base) MCG/ACT inhaler Inhale 2 puffs into the lungs every 6 (six) hours as needed for wheezing or shortness of breath. 06/28/19   Cathie Hoops, Axavier Pressley V, PA-C  fluticasone (FLOVENT HFA) 44 MCG/ACT inhaler Inhale 2 puffs into the lungs 2 (two) times daily. 05/08/19   Cathie Hoops, Paisyn Guercio V, PA-C  ipratropium-albuterol (DUONEB) 0.5-2.5 (3) MG/3ML SOLN Take 3 mLs by nebulization every 6 (six) hours as needed. 06/28/19   Cathie Hoops, Fayth Trefry V, PA-C  predniSONE (DELTASONE) 50 MG tablet Take 1 tablet (50 mg total) by mouth daily with breakfast. 06/28/19   Cathie Hoops, Maybelle Depaoli  V, PA-C  rizatriptan (MAXALT) 10 MG tablet Take 1 tablet (10 mg total) by mouth as needed for migraine. May repeat in 2 hours if needed 06/09/19   Mayer Masker, PA-C  dicyclomine (BENTYL) 20 MG tablet Take 1 tablet (20 mg total) by mouth 2 (two) times daily. Patient not taking: Reported on 02/23/2019 12/18/18 05/08/19  Belinda Fisher, PA-C  enoxaparin (LOVENOX) 30 MG/0.3ML injection Inject 0.4 mLs (40 mg total) into the skin every 12 (twelve) hours. 07/31/17 07/19/18  Marylene Land, CNM  ferrous sulfate (FERROUSUL) 325 (65 FE) MG tablet Take 1 tablet (325 mg total) by mouth daily with breakfast. Patient not taking: Reported on 07/27/2017 02/21/17 07/19/18  Leftwich-Kirby, Wilmer Floor, CNM  furosemide (LASIX) 40 MG tablet Take 1 tablet (40 mg total) by mouth daily. Patient not taking: Reported on 08/24/2017 08/07/17 07/19/18  Marylene Land, CNM  glycopyrrolate (ROBINUL) 1 MG tablet Take 1 tablet (1 mg total) by mouth 3 (three) times daily. Patient not taking: Reported on 06/27/2017 02/09/17 07/19/18  Roe Coombs, CNM  pantoprazole (PROTONIX) 40 MG tablet TAKE 1 TABLET BY MOUTH EVERY DAY Patient not taking: Reported on 08/24/2017 08/09/17 07/19/18  Adam Phenix, MD    Family History Family History  Problem Relation Age of Onset  . Hypertension Maternal Grandfather   . Hypertension Paternal Grandmother   . Cancer Paternal Grandmother   . Hyperlipidemia Paternal Grandmother   . Diabetes Paternal Grandmother   . Hypertension Father   . Diabetes Father   . Cancer Sister        skin  . Cancer Maternal Grandmother        leukemia  . Rheum arthritis Mother   . Lupus Mother   . Hypothyroidism Maternal Aunt   . Lupus Other     Social History Social History   Tobacco Use  . Smoking status: Never Smoker  . Smokeless tobacco: Never Used  Vaping Use  . Vaping Use: Never used  Substance Use Topics  . Alcohol use: No  . Drug use: No     Allergies   Banana and  Cefixime   Review of Systems Review of Systems  Reason unable to perform ROS: See HPI as above.     Physical Exam Triage Vital Signs ED Triage Vitals  Enc Vitals Group     BP --      Pulse --      Resp --      Temp 06/28/19 1425 98.4 F (36.9 C)     Temp Source 06/28/19 1425 Oral     SpO2 --      Weight --      Height --      Head Circumference --  Peak Flow --      Pain Score 06/28/19 1428 7     Pain Loc --      Pain Edu? --      Excl. in [place]? --    No data found.  Updated Vital Signs BP 119/86 (BP Location: Left Arm)   Pulse 91   Temp 98.4 F (36.9 C) (Oral)   Resp 17   LMP 06/23/2019   SpO2 97%   Physical Exam Constitutional:      General: She is not in acute distress.    Appearance: Normal appearance. She is well-developed. She is not ill-appearing, toxic-appearing or diaphoretic.  HENT:     Head: Normocephalic and atraumatic.     Right Ear: Tympanic membrane, ear canal and external ear normal. Tympanic membrane is not erythematous or bulging.     Left Ear: Tympanic membrane, ear canal and external ear normal. Tympanic membrane is not erythematous or bulging.     Nose:     Right Sinus: No maxillary sinus tenderness or frontal sinus tenderness.     Left Sinus: No maxillary sinus tenderness or frontal sinus tenderness.     Mouth/Throat:     Mouth: Mucous membranes are moist.     Pharynx: Oropharynx is clear. Uvula midline.  Eyes:     Conjunctiva/sclera: Conjunctivae normal.     Pupils: Pupils are equal, round, and reactive to light.  Cardiovascular:     Rate and Rhythm: Normal rate and regular rhythm.  Pulmonary:     Effort: Pulmonary effort is normal. No accessory muscle usage, prolonged expiration, respiratory distress or retractions.     Breath sounds: No decreased air movement or transmitted upper airway sounds. No decreased breath sounds.     Comments: LCTAB Musculoskeletal:     Cervical back: Normal range of motion and neck supple.  Skin:     General: Skin is warm and dry.  Neurological:     Mental Status: She is alert and oriented to person, place, and time.      UC Treatments / Results  Labs (all labs ordered are listed, but only abnormal results are displayed) Labs Reviewed  NOVEL CORONAVIRUS, NAA    EKG   Radiology No results found.  Procedures Procedures (including critical care time)  Medications Ordered in UC Medications - No data to display  Initial Impression / Assessment and Plan / UC Course  I have reviewed the triage vital signs and the nursing notes.  Pertinent labs & imaging results that were available during my care of the patient were reviewed by me and considered in my medical decision making (see chart for details).     Patient afebrile, nontoxic in appearance. Given multiple sick contact, will test for COVID. LCTAB at this time without tachypnea, hypoxia. Will restart albuterol as needed. duoneb sparingly. Written Rx of prednisone provided, to start if symptoms still not improving. Return precautions given. Otherwise to follow up with PCP for further maintenance of asthma.  Final Clinical Impressions(s) / UC Diagnoses   Final diagnoses:  Cough  Mild persistent asthma with acute exacerbation    ED Prescriptions    Medication Sig Dispense Auth. Provider   albuterol (VENTOLIN HFA) 108 (90 Base) MCG/ACT inhaler Inhale 2 puffs into the lungs every 6 (six) hours as needed for wheezing or shortness of breath. 18 g Maylene Crocker V, PA-C   albuterol (PROVENTIL) (2.5 MG/3ML) 0.083% nebulizer solution Take 3 mLs (2.5 mg total) by nebulization every 6 (six) hours as needed  for wheezing or shortness of breath. 75 mL Timofey Carandang V, PA-C   ipratropium-albuterol (DUONEB) 0.5-2.5 (3) MG/3ML SOLN Take 3 mLs by nebulization every 6 (six) hours as needed. 60 mL Seanpatrick Maisano V, PA-C   predniSONE (DELTASONE) 50 MG tablet Take 1 tablet (50 mg total) by mouth daily with breakfast. 5 tablet Belinda Fisher, PA-C     PDMP not  reviewed this encounter.   Belinda Fisher, PA-C 06/28/19 1456

## 2019-06-28 NOTE — Discharge Instructions (Signed)
COVID PCR testing ordered. I would like you to quarantine until testing results. Do 1 duoneb treatment when you get home, one before bedtime, then albuterol as needed with duoneb sparingly if still needed. If no relief, fill prednisone as directed. Restart zyrtec and flonase.  Monitor for any worsening of symptoms, chest pain, shortness of breath, wheezing, swelling of the throat, go to the emergency department for further evaluation needed.

## 2019-06-28 NOTE — ED Triage Notes (Addendum)
Patient presents with shortness of breath, feeling fatigued and body aches. She states she had a cough that is subsiding. She notes traveling last weekend for a family gathering. No COVID vaccine.

## 2019-06-29 LAB — SARS-COV-2, NAA 2 DAY TAT

## 2019-06-29 LAB — NOVEL CORONAVIRUS, NAA: SARS-CoV-2, NAA: NOT DETECTED

## 2019-09-01 ENCOUNTER — Telehealth: Payer: BC Managed Care – PPO | Admitting: Emergency Medicine

## 2019-09-01 DIAGNOSIS — J4521 Mild intermittent asthma with (acute) exacerbation: Secondary | ICD-10-CM

## 2019-09-01 MED ORDER — ALBUTEROL SULFATE HFA 108 (90 BASE) MCG/ACT IN AERS: 1.0000 | INHALATION_SPRAY | RESPIRATORY_TRACT | 0 refills | Status: DC | PRN

## 2019-09-01 MED ORDER — METHYLPREDNISOLONE 4 MG PO TBPK: ORAL_TABLET | ORAL | 0 refills | Status: DC

## 2019-09-01 NOTE — Progress Notes (Signed)
Visit for Asthma  Based on what you have shared with me, it looks like you may have a flare up of your asthma.  Asthma is a chronic (ongoing) lung disease which results in airway obstruction, inflammation and hyper-responsiveness.   Asthma symptoms vary from person to person, with common symptoms including nighttime awakening and decreased ability to participate in normal activities as a result of shortness of breath. It is often triggered by changes in weather, changes in the season, changes in air temperature, or inside (home, school, daycare or work) allergens such as animal dander, mold, mildew, woodstoves or cockroaches.   It can also be triggered by hormonal changes, extreme emotion, physical exertion or an upper respiratory tract illness.     It is important to identify the trigger, and then eliminate or avoid the trigger if possible.   If you have been prescribed medications to be taken on a regular basis, it is important to follow the asthma action plan and to follow guidelines to adjust medication in response to increasing symptoms of decreased peak expiratory flow rate  Treatment: I have prescribed: Albuterol (Proventil HFA; Ventolin HFA) 108 (90 Base) MCG/ACT Inhaler 2 puffs into the lungs every six hours as needed for wheezing or shortness of breath  I have also prescribed a medrol dosepack, use as directed.  HOME CARE . Only take medications as instructed by your medical team. . Consider wearing a mask or scarf to improve breathing air temperature have been shown to decrease irritation and decrease exacerbations . Get rest. . Taking a steamy shower or using a humidifier may help nasal congestion sand ease sore throat pain. You can place a towel over your head and breathe in the steam from hot water coming from a faucet. . Using a saline nasal spray works much the same way.   . Cough drops, hare candies and sore throat lozenges may ease your cough.  . Avoid close contacts especially the very you and the elderly . Cover your mouth if you cough or sneeze . Always remember to wash your hands.    GET HELP RIGHT AWAY IF: . You develop worsening symptoms; breathlessness at rest, drowsy, confused or agitated, unable to speak in full sentences . You have coughing fits . You develop a severe headache or visual changes . You develop shortness of breath, difficulty breathing or start having chest pain . Your symptoms persist after you have completed your treatment plan . If your symptoms do not improve within 10 days  MAKE SURE YOU . Understand these instructions. . Will watch your condition. . Will get help right away if you are not doing well or get worse.   Your e-visit answers were reviewed by a board certified advanced clinical practitioner to complete your personal care plan, Depending upon the condition, your plan could have included both over the counter or prescription medications.  Please review your pharmacy choice. Your safety is important to [place]. If you have drug allergies check your prescription carefully. You can use MyChart to ask questions about today's visit, request a non-urgent call back, or ask for a work or school excuse for 24 hours related to this e-Visit. If it has been greater than 24 hours you will need to follow up with your provider, or enter a new e-Visit to address those concerns.  You will get an e-mail in the next two days asking about your experience. I hope that your e-visit has been valuable and will speed your recovery. Thank  you for using e-visits.  **Please do not respond to this message unless you have follow up questions.** Greater than 5 but less than 10 minutes spent researching, coordinating, and implementing care for this patient today

## 2019-09-03 ENCOUNTER — Ambulatory Visit: Payer: BC Managed Care – PPO | Admitting: Obstetrics

## 2019-09-15 ENCOUNTER — Ambulatory Visit
Admission: EM | Admit: 2019-09-15 | Discharge: 2019-09-15 | Disposition: A | Discharge status: Home / Self Care | Payer: BC Managed Care – PPO | Attending: Physician Assistant | Admitting: Physician Assistant

## 2019-09-15 DIAGNOSIS — R059 Cough, unspecified: Secondary | ICD-10-CM

## 2019-09-15 DIAGNOSIS — R0602 Shortness of breath: Secondary | ICD-10-CM

## 2019-09-15 DIAGNOSIS — Z1152 Encounter for screening for COVID-19: Secondary | ICD-10-CM

## 2019-09-15 DIAGNOSIS — R0981 Nasal congestion: Secondary | ICD-10-CM

## 2019-09-15 MED ORDER — ALBUTEROL SULFATE HFA 108 (90 BASE) MCG/ACT IN AERS: 2.0000 | INHALATION_SPRAY | Freq: Four times a day (QID) | RESPIRATORY_TRACT | 0 refills | Status: DC | PRN

## 2019-09-15 MED ORDER — BENZONATATE 200 MG PO CAPS: 200.0000 mg | ORAL_CAPSULE | Freq: Three times a day (TID) | ORAL | 0 refills | Status: DC

## 2019-09-15 NOTE — ED Provider Notes (Signed)
[facility]    CSN: [id number] Arrival date & time: 09/15/19  1439      History   Chief Complaint Chief Complaint  Patient presents with  . Cough    HPI Debra Horn is a 34 y.o. female.   69 yar old female comes in for 4 day of URI symptoms. Rhinorrhea, sore throat, nasal congestion, cough, shortness of breath. States shortness of breath, worse with exertion, not relieved by inhaler. Denies fever, chills, body aches. Denies abdominal pain, nausea, vomiting, diarrhea. Denies loss of taste/smell. Daughter, who also has URI symptoms, has positive COVID exposure       Past Medical History:  Diagnosis Date  . Anemia    low iron  . Asthma   . Complication of anesthesia    bp dropped in middle of last c-section and started with n/v  . GERD (gastroesophageal reflux disease)    during pregnancy  . Headache    "couple days/week" (10/28/2015)  . Hyperthyroidism    not on medication  . PE (pulmonary embolism) 11/2011  . PONV (postoperative nausea and vomiting)   . Pregnancy induced hypertension   . Preterm labor     Patient Active Problem List   Diagnosis Date Noted  . Status post repeat low transverse cesarean section 07/27/2017  . Obesity affecting pregnancy in third trimester   . Poor fetal growth affecting management of mother in third trimester   . Previous cesarean delivery affecting pregnancy   . Previous pregnancy complicated by pregnancy-induced hypertension in third trimester, antepartum   . [redacted] weeks gestation of pregnancy   . Unwanted fertility 06/06/2017  . IUGR (intrauterine growth restriction) in prior pregnancy, pregnant 03/06/2017  . Vitamin D deficiency 02/17/2017  . Hx of preeclampsia, prior pregnancy, currently pregnant 02/09/2017  . History of preterm delivery 02/09/2017  . History of asthma   . Normocytic anemia 02/06/2016  . Family history of systemic lupus erythematosus (SLE) in mother 02/19/2014  . Hyperthyroidism 02/19/2014    . Previous cesarean section 08/08/2012  . Hx pulmonary embolism 07/24/2012  . Supervision of high-risk pregnancy 07/24/2012    Past Surgical History:  Procedure Laterality Date  . CESAREAN SECTION  2008; 2014  . CESAREAN SECTION N/A 07/27/2017   Procedure: REPEAT CESAREAN SECTION;  Surgeon: Reva Bores, MD;  Location: [facility] BIRTHING SUITES;  Service: Obstetrics;  Laterality: N/A;  . FRACTURE SURGERY    . ORIF ANKLE FRACTURE Right 10/28/2015  . ORIF ANKLE FRACTURE Right 10/28/2015   Procedure: OPEN REDUCTION INTERNAL FIXATION (ORIF) RIGHT ANKLE FRACTURE;  Surgeon: Yolonda Kida, MD;  Location: [facility] OR;  Service: Orthopedics;  Laterality: Right;  . TUBAL LIGATION Bilateral 07/27/2017   Procedure: BILATERAL TUBAL LIGATION;  Surgeon: Reva Bores, MD;  Location: [facility] BIRTHING SUITES;  Service: Obstetrics;  Laterality: Bilateral;    OB History    Gravida  3   Para  3   Term  1   Preterm  2   AB      Living  3     SAB      TAB      Ectopic      Multiple  0   Live Births  3            Home Medications    Prior to Admission medications   Medication Sig Start Date End Date Taking? Authorizing Provider  albuterol (PROVENTIL) (2.5 MG/3ML) 0.083% nebulizer solution Take 3 mLs (2.5 mg total) by nebulization every  6 (six) hours as needed for wheezing or shortness of breath. 06/28/19   Cathie Hoops, Othell Jaime V, PA-C  albuterol (VENTOLIN HFA) 108 (90 Base) MCG/ACT inhaler Inhale 2 puffs into the lungs every 6 (six) hours as needed for wheezing or shortness of breath. 09/15/19   Cathie Hoops, Naomee Nowland V, PA-C  benzonatate (TESSALON) 200 MG capsule Take 1 capsule (200 mg total) by mouth every 8 (eight) hours. 09/15/19   Cathie Hoops, Tetsuo Coppola V, PA-C  fluticasone (FLOVENT HFA) 44 MCG/ACT inhaler Inhale 2 puffs into the lungs 2 (two) times daily. 05/08/19   Belinda Fisher, PA-C  methylPREDNISolone (MEDROL DOSEPAK) 4 MG TBPK tablet Use as directed 09/01/19   Arthor Captain, PA-C  rizatriptan (MAXALT) 10 MG tablet Take 1 tablet  (10 mg total) by mouth as needed for migraine. May repeat in 2 hours if needed 06/09/19   Mayer Masker, PA-C  dicyclomine (BENTYL) 20 MG tablet Take 1 tablet (20 mg total) by mouth 2 (two) times daily. Patient not taking: Reported on 02/23/2019 12/18/18 05/08/19  Belinda Fisher, PA-C  enoxaparin (LOVENOX) 30 MG/0.3ML injection Inject 0.4 mLs (40 mg total) into the skin every 12 (twelve) hours. 07/31/17 07/19/18  Marylene Land, CNM  ferrous sulfate (FERROUSUL) 325 (65 FE) MG tablet Take 1 tablet (325 mg total) by mouth daily with breakfast. Patient not taking: Reported on 07/27/2017 02/21/17 07/19/18  Leftwich-Kirby, Wilmer Floor, CNM  furosemide (LASIX) 40 MG tablet Take 1 tablet (40 mg total) by mouth daily. Patient not taking: Reported on 08/24/2017 08/07/17 07/19/18  Marylene Land, CNM  glycopyrrolate (ROBINUL) 1 MG tablet Take 1 tablet (1 mg total) by mouth 3 (three) times daily. Patient not taking: Reported on 06/27/2017 02/09/17 07/19/18  Orvilla Cornwall A, CNM  ipratropium-albuterol (DUONEB) 0.5-2.5 (3) MG/3ML SOLN Take 3 mLs by nebulization every 6 (six) hours as needed. 06/28/19 09/15/19  Belinda Fisher, PA-C  pantoprazole (PROTONIX) 40 MG tablet TAKE 1 TABLET BY MOUTH EVERY DAY Patient not taking: Reported on 08/24/2017 08/09/17 07/19/18  Adam Phenix, MD    Family History Family History  Problem Relation Age of Onset  . Hypertension Maternal Grandfather   . Hypertension Paternal Grandmother   . Cancer Paternal Grandmother   . Hyperlipidemia Paternal Grandmother   . Diabetes Paternal Grandmother   . Hypertension Father   . Diabetes Father   . Cancer Sister        skin  . Cancer Maternal Grandmother        leukemia  . Rheum arthritis Mother   . Lupus Mother   . Hypothyroidism Maternal Aunt   . Lupus Other     Social History Social History   Tobacco Use  . Smoking status: Never Smoker  . Smokeless tobacco: Never Used  Vaping Use  . Vaping Use: Never used  Substance Use  Topics  . Alcohol use: No  . Drug use: No     Allergies   Banana and Cefixime   Review of Systems Review of Systems  Reason unable to perform ROS: See HPI as above.     Physical Exam Triage Vital Signs ED Triage Vitals  Enc Vitals Group     BP      Pulse      Resp      Temp      Temp src      SpO2      Weight      Height      Head Circumference  Peak Flow      Pain Score      Pain Loc      Pain Edu?      Excl. in [place]?    No data found.  Updated Vital Signs BP 128/87 (BP Location: Left Arm)   Pulse 77   Temp 98.3 F (36.8 C) (Oral)   Resp 18   SpO2 98%   Breastfeeding No   Physical Exam Constitutional:      General: She is not in acute distress.    Appearance: Normal appearance. She is not ill-appearing, toxic-appearing or diaphoretic.  HENT:     Head: Normocephalic and atraumatic.     Mouth/Throat:     Mouth: Mucous membranes are moist.     Pharynx: Oropharynx is clear. Uvula midline.  Cardiovascular:     Rate and Rhythm: Normal rate and regular rhythm.     Heart sounds: Normal heart sounds. No murmur heard.  No friction rub. No gallop.   Pulmonary:     Effort: Pulmonary effort is normal. No accessory muscle usage, prolonged expiration, respiratory distress or retractions.     Comments: Lungs clear to auscultation without adventitious lung sounds. Musculoskeletal:     Cervical back: Normal range of motion and neck supple.  Neurological:     General: No focal deficit present.     Mental Status: She is alert and oriented to person, place, and time.      UC Treatments / Results  Labs (all labs ordered are listed, but only abnormal results are displayed) Labs Reviewed  NOVEL CORONAVIRUS, NAA    EKG   Radiology No results found.  Procedures Procedures (including critical care time)  Medications Ordered in UC Medications - No data to display  Initial Impression / Assessment and Plan / UC Course  I have reviewed the triage vital  signs and the nursing notes.  Pertinent labs & imaging results that were available during my care of the patient were reviewed by me and considered in my medical decision making (see chart for details).    COVID PCR test ordered. Patient to quarantine until testing results return. No alarming signs on exam. LCTAB. Symptomatic treatment discussed.  Push fluids.  Return precautions given.  Patient expresses understanding and agrees to plan.  Final Clinical Impressions(s) / UC Diagnoses   Final diagnoses:  Encounter for screening for COVID-19  Nasal congestion  Cough  Shortness of breath    ED Prescriptions    Medication Sig Dispense Auth. Provider   albuterol (VENTOLIN HFA) 108 (90 Base) MCG/ACT inhaler Inhale 2 puffs into the lungs every 6 (six) hours as needed for wheezing or shortness of breath. 18 g Jahan Friedlander V, PA-C   benzonatate (TESSALON) 200 MG capsule Take 1 capsule (200 mg total) by mouth every 8 (eight) hours. 21 capsule Belinda Fisher, PA-C     PDMP not reviewed this encounter.   Belinda Fisher, PA-C 09/15/19 1739

## 2019-09-15 NOTE — Discharge Instructions (Addendum)
COVID PCR testing ordered. I would like you to quarantine until testing results. Albuterol as directed for shortness of breath. Tessalon for  cough if needed. You can take over the counter flonase/nasacort to help with nasal congestion/drainage. Tylenol/motrin for pain and fever. Keep hydrated, urine should be clear to pale yellow in color. If experiencing worsening shortness of breath, trouble breathing, go to the emergency department for further evaluation needed.

## 2019-09-15 NOTE — ED Triage Notes (Signed)
Pt c/o SOB, nasal congestion, and cough x4day. States her daughter had a positive covid exposure at school last week.

## 2019-09-16 LAB — SARS-COV-2, NAA 2 DAY TAT

## 2019-09-16 LAB — NOVEL CORONAVIRUS, NAA: SARS-CoV-2, NAA: NOT DETECTED

## 2019-11-08 DIAGNOSIS — J4520 Mild intermittent asthma, uncomplicated: Secondary | ICD-10-CM | POA: Diagnosis not present

## 2019-11-08 DIAGNOSIS — R0602 Shortness of breath: Secondary | ICD-10-CM | POA: Diagnosis not present

## 2019-12-18 ENCOUNTER — Telehealth: Payer: Medicaid Other | Admitting: Emergency Medicine

## 2019-12-18 DIAGNOSIS — J45901 Unspecified asthma with (acute) exacerbation: Secondary | ICD-10-CM | POA: Diagnosis not present

## 2019-12-18 MED ORDER — ALBUTEROL SULFATE HFA 108 (90 BASE) MCG/ACT IN AERS: 2.0000 | INHALATION_SPRAY | RESPIRATORY_TRACT | 0 refills | Status: DC | PRN

## 2019-12-18 MED ORDER — PREDNISONE 20 MG PO TABS: 40.0000 mg | ORAL_TABLET | Freq: Every day | ORAL | 0 refills | Status: DC

## 2019-12-18 NOTE — Progress Notes (Signed)
Visit for Asthma  Based on what you have shared with me, it looks like you may have a flare up of your asthma.  Asthma is a chronic (ongoing) lung disease which results in airway obstruction, inflammation and hyper-responsiveness.   Asthma symptoms vary from person to person, with common symptoms including nighttime awakening and decreased ability to participate in normal activities as a result of shortness of breath. It is often triggered by changes in weather, changes in the season, changes in air temperature, or inside (home, school, daycare or work) allergens such as animal dander, mold, mildew, woodstoves or cockroaches.   It can also be triggered by hormonal changes, extreme emotion, physical exertion or an upper respiratory tract illness.     It is important to identify the trigger, and then eliminate or avoid the trigger if possible.   If you have been prescribed medications to be taken on a regular basis, it is important to follow the asthma action plan and to follow guidelines to adjust medication in response to increasing symptoms of decreased peak expiratory flow rate  Treatment: I have prescribed: Albuterol (Proventil HFA; Ventolin HFA) 108 (90 Base) MCG/ACT Inhaler 2 puffs into the lungs every six hours as needed for wheezing or shortness of breath   I've also sent an RX for some prednisone for a few days that should help open you up.  HOME CARE . Only take medications as instructed by your medical team. . Consider wearing a mask or scarf to improve breathing air temperature have been shown to decrease irritation and decrease exacerbations . Get rest. . Taking a steamy shower or using a humidifier may help nasal congestion sand ease sore throat pain. You can place a towel over your head and breathe in the steam from hot water coming from a faucet. . Using a saline nasal  spray works much the same way.  . Cough drops, hare candies and sore throat lozenges may ease your cough.  . Avoid close contacts especially the very you and the elderly . Cover your mouth if you cough or sneeze . Always remember to wash your hands.    GET HELP RIGHT AWAY IF: . You develop worsening symptoms; breathlessness at rest, drowsy, confused or agitated, unable to speak in full sentences . You have coughing fits . You develop a severe headache or visual changes . You develop shortness of breath, difficulty breathing or start having chest pain . Your symptoms persist after you have completed your treatment plan . If your symptoms do not improve within 10 days  MAKE SURE YOU . Understand these instructions. . Will watch your condition. . Will get help right away if you are not doing well or get worse.   Your e-visit answers were reviewed by a board certified advanced clinical practitioner to complete your personal care plan, Depending upon the condition, your plan could have included both over the counter or prescription medications.  Please review your pharmacy choice. Your safety is important to [place]. If you have drug allergies check your prescription carefully. You can use MyChart to ask questions about today's visit, request a non-urgent call back, or ask for a work or school excuse for 24 hours related to this e-Visit. If it has been greater than 24 hours you will need to follow up with your provider, or enter a new e-Visit to address those concerns.  You will get an e-mail in the next two days asking about your experience. I hope that your e-visit  has been valuable and will speed your recovery. Thank you for using e-visits.  Approximately 5 minutes was used in reviewing the patient's chart, questionnaire, prescribing medications, and documentation.

## 2020-02-29 ENCOUNTER — Telehealth: Payer: BC Managed Care – PPO | Admitting: Physician Assistant

## 2020-02-29 DIAGNOSIS — J4540 Moderate persistent asthma, uncomplicated: Secondary | ICD-10-CM | POA: Diagnosis not present

## 2020-02-29 MED ORDER — ALBUTEROL SULFATE HFA 108 (90 BASE) MCG/ACT IN AERS
2.0000 | INHALATION_SPRAY | RESPIRATORY_TRACT | 0 refills | Status: DC | PRN
Start: 2020-02-29 — End: 2020-04-23

## 2020-02-29 MED ORDER — PREDNISONE 20 MG PO TABS: 40.0000 mg | ORAL_TABLET | Freq: Every day | ORAL | 0 refills | Status: DC

## 2020-02-29 NOTE — Progress Notes (Signed)
Visit for Asthma  Based on what you have shared with me, it looks like you may have a flare up of your asthma.  Asthma is a chronic (ongoing) lung disease which results in airway obstruction, inflammation and hyper-responsiveness.   Asthma symptoms vary from person to person, with common symptoms including nighttime awakening and decreased ability to participate in normal activities as a result of shortness of breath. It is often triggered by changes in weather, changes in the season, changes in air temperature, or inside (home, school, daycare or work) allergens such as animal dander, mold, mildew, woodstoves or cockroaches.   It can also be triggered by hormonal changes, extreme emotion, physical exertion or an upper respiratory tract illness.     It is important to identify the trigger, and then eliminate or avoid the trigger if possible.   If you have been prescribed medications to be taken on a regular basis, it is important to follow the asthma action plan and to follow guidelines to adjust medication in response to increasing symptoms of decreased peak expiratory flow rate  Treatment: I have prescribed: Prednisone 40mg  by mouth per day for 5 day to calm down exacerbation of asthma. You can continue your albuterol inhaler as directed when needed. You also need to see your primary provider to discuss these issues as a change in your chronic breathing medications is needed to get better control of daily symptoms and prevent exacerbations. We are unable to do chronic disease management via e-visit here so please contact your primary provider ASAP.   HOME CARE . Only take medications as instructed by your medical team. . Consider wearing a mask or scarf to improve breathing air temperature have been shown to decrease irritation and decrease exacerbations . Get rest. . Taking a steamy  shower or using a humidifier may help nasal congestion sand ease sore throat pain. You can place a towel over your head and breathe in the steam from hot water coming from a faucet. . Using a saline nasal spray works much the same way.  . Cough drops, hare candies and sore throat lozenges may ease your cough.  . Avoid close contacts especially the very you and the elderly . Cover your mouth if you cough or sneeze . Always remember to wash your hands.    GET HELP RIGHT AWAY IF: . You develop worsening symptoms; breathlessness at rest, drowsy, confused or agitated, unable to speak in full sentences . You have coughing fits . You develop a severe headache or visual changes . You develop shortness of breath, difficulty breathing or start having chest pain . Your symptoms persist after you have completed your treatment plan . If your symptoms do not improve within 10 days  MAKE SURE YOU . Understand these instructions. . Will watch your condition. . Will get help right away if you are not doing well or get worse.   Your e-visit answers were reviewed by a board certified advanced clinical practitioner to complete your personal care plan, Depending upon the condition, your plan could have included both over the counter or prescription medications.  Please review your pharmacy choice. Your safety is important to . If you have drug allergies check your prescription carefully. You can use MyChart to ask questions about today's visit, request a non-urgent call back, or ask for a work or school excuse for 24 hours related to this e-Visit. If it has been greater than 24 hours you will need to follow up with your  provider, or enter a new e-Visit to address those concerns.  You will get an e-mail in the next two days asking about your experience. I hope that your e-visit has been valuable and will speed your recovery. Thank you for using e-visits.

## 2020-02-29 NOTE — Progress Notes (Signed)
Duplicate encounter.  Disregard. 

## 2020-02-29 NOTE — Progress Notes (Signed)
I have spent 5 minutes in review of e-visit questionnaire, review and updating patient chart, medical decision making and response to patient.   Circe Chilton Cody Lucio Litsey, PA-C    

## 2020-04-22 ENCOUNTER — Ambulatory Visit (INDEPENDENT_AMBULATORY_CARE_PROVIDER_SITE_OTHER): Payer: BC Managed Care – PPO | Admitting: Internal Medicine

## 2020-04-22 ENCOUNTER — Encounter: Payer: Self-pay | Admitting: Internal Medicine

## 2020-04-22 ENCOUNTER — Other Ambulatory Visit: Payer: Self-pay

## 2020-04-22 VITALS — BP 126/84 | HR 82 | Temp 97.2°F | Ht 62.25 in | Wt 258.2 lb

## 2020-04-22 DIAGNOSIS — G4733 Obstructive sleep apnea (adult) (pediatric): Secondary | ICD-10-CM | POA: Diagnosis not present

## 2020-04-22 DIAGNOSIS — R0602 Shortness of breath: Secondary | ICD-10-CM

## 2020-04-22 DIAGNOSIS — K219 Gastro-esophageal reflux disease without esophagitis: Secondary | ICD-10-CM | POA: Diagnosis not present

## 2020-04-22 MED ORDER — PANTOPRAZOLE SODIUM 40 MG PO TBEC: 40.0000 mg | DELAYED_RELEASE_TABLET | Freq: Every day | ORAL | 5 refills | Status: DC

## 2020-04-22 MED ORDER — BUDESONIDE-FORMOTEROL FUMARATE 160-4.5 MCG/ACT IN AERO: 2.0000 | INHALATION_SPRAY | Freq: Two times a day (BID) | RESPIRATORY_TRACT | 12 refills | Status: DC

## 2020-04-22 NOTE — Progress Notes (Signed)
Debra Horn    [id number]    08-05-1985  Primary Care Physician:Abonza, Genia Plants  Referring Physician: Mayer Masker, PA-C [id number] [place],  [place] [address] Reason for Consultation: shortness of breath, asthma Date of Consultation: 04/22/2020  Chief complaint:   Chief Complaint  Patient presents with  . Consult    Self referral for asthma. States she was diagnosed with asthma has a child. Felt she grew out of it as a adult but has noticed the wheezing and SOB getting worse in the past few months. States she uses her albuterol at least twice daily. Notices a choking sensation at night. Denies ever having a sleep study before. Has used Flovent before in the past.      HPI: AARALYN Horn is a 35 y.o. woman history of childhood asthma and pulmonary embolism in November 2013 (provoked from OCPs) treated with blood thinners for 6 months. Got pregnant during this time and was on lovenox. No family history of blood clots   Here for worsening asthma symptoms. More recently has had recurrence of symptoms of wheezing, shortness of breath and chest tightness over the last year. Symptoms are worse at night time when she feels choked up in her sleep and can't breath. Symptoms are only sometimes improved with albuterol at night time.   She sleeps with an air purifier at night to help keep her cool - heat makes things worse.  During the day   She has gained weight over the last 2 year as well - about 50 lbs.   Has been on flovent in the past but didn't feel that it made much difference. Albuterol doesn't consistently help her breathing.  Prednisone does seem to help.   Current Regimen: albuterol prn Asthma Triggers: hot weather Exacerbations in the last year: twice requiring prednisone History of hospitalization or intubation: Yes in 2017 kept in the hospital for 3 days.  Allergy Testing: no GERD: yes not well controlled.  Allergic Rhinitis: yes, takes  benadryl prn. ACT: No flowsheet data found. FeNO:  OBSTRUCTIVE SLEEP APNEA SCREENING  1.  Snoring?:  unknown 2.  Tired?:  yes 3.  Observed apnea, stop breathing or choking/gasping during sleep?:  yes 4.  Pressure. HTN history?  no 5.  BMI more than 35 kg/m2?  yes 6.  Age more than 50 yrs?  no 7.  Neck size larger than 17 in for female or 16 in for female?  yes 8.  Gender = Female?  no  Total:  4  For general population  OSA - Low Risk : Yes to 0 - 2 questions OSA - Intermediate Risk : Yes to 3 - 4 questions OSA - High Risk : Yes to 5 - 8 questions  or Yes to 2 or more of 4 STOP questions + female gender or Yes to 2 or more of 4 STOP questions + BMI > 35kg/m2  or Yes to 2 or more of 4 STOP questions + neck circumference 17 inches / 43cm in female or 16 inches / 41cm in female  References: Landry Dyke al. Anesthesiology 2008; 108: 812-821,  Beecher Mcardle et al Br Michela Pitcher 2012; 108: 102-585,  Beecher Mcardle et al J Clin Sleep Med Sept 2014.   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  Exposures: lives at home with husband and three kids. No pets at home.  Smoking history:  never smoker, has ongoing passive smoke exposure with her husband   Social History   Occupational History  . Occupation: Clinical biochemist  Tobacco Use  . Smoking status: Never Smoker  . Smokeless tobacco: Never Used  Vaping Use  . Vaping Use: Never used  Substance and Sexual Activity  . Alcohol use: No  . Drug use: No  . Sexual activity: Yes    Birth control/protection: None, Surgical    Relevant family history:  Family History  Problem Relation Age of Onset  . Hypertension Maternal Grandfather   . Hypertension Paternal Grandmother   . Cancer Paternal Grandmother   . Hyperlipidemia Paternal Grandmother   . Diabetes Paternal Grandmother   . Hypertension Father   . Diabetes Father   . Cancer Sister        skin  . Cancer Maternal Grandmother        leukemia  . Rheum  arthritis Mother   . Lupus Mother   . Hypothyroidism Maternal Aunt   . Lupus Other     Past Medical History:  Diagnosis Date  . Anemia    low iron  . Asthma   . Complication of anesthesia    bp dropped in middle of last c-section and started with n/v  . GERD (gastroesophageal reflux disease)    during pregnancy  . Headache    "couple days/week" (10/28/2015)  . Hyperthyroidism    not on medication  . PE (pulmonary embolism) 11/2011  . PONV (postoperative nausea and vomiting)   . Pregnancy induced hypertension   . Preterm labor     Past Surgical History:  Procedure Laterality Date  . CESAREAN SECTION  2008; 2014  . CESAREAN SECTION N/A 07/27/2017   Procedure: REPEAT CESAREAN SECTION;  Surgeon: Reva Bores, MD;  Location: [facility] BIRTHING SUITES;  Service: Obstetrics;  Laterality: N/A;  . FRACTURE SURGERY    . ORIF ANKLE FRACTURE Right 10/28/2015  . ORIF ANKLE FRACTURE Right 10/28/2015   Procedure: OPEN REDUCTION INTERNAL FIXATION (ORIF) RIGHT ANKLE FRACTURE;  Surgeon: Yolonda Kida, MD;  Location: [facility] OR;  Service: Orthopedics;  Laterality: Right;  . TUBAL LIGATION Bilateral 07/27/2017   Procedure: BILATERAL TUBAL LIGATION;  Surgeon: Reva Bores, MD;  Location: [facility] BIRTHING SUITES;  Service: Obstetrics;  Laterality: Bilateral;     Physical Exam: Blood pressure 126/84, pulse 82, temperature (!) 97.2 F (36.2 C), temperature source Temporal, height 5' 2.25" (1.581 m), weight 258 lb 3.2 oz (117.1 kg), SpO2 100 %. Gen:      No acute distress ENT:  no nasal polyps, mucus membranes moist, mallampati IV Lungs:    Diminished, No increased respiratory effort, symmetric chest wall excursion, clear to auscultation bilaterally, no wheezes or crackles CV:         Regular rate and rhythm; no murmurs, rubs, or gallops.  No pedal edema Abd:      + bowel sounds; soft, non-tender; no distension MSK: no acute synovitis of DIP or PIP joints, no mechanics hands.  Skin:      Warm and dry;  no rashes Neuro: normal speech, no focal facial asymmetry Psych: alert and oriented x3, normal mood and affect   Data Reviewed/Medical Decision Making:  Independent interpretation of tests: Imaging: . Review of patient's CT Angio Chest July 2019 images revealed no acute cardiopulmonary process, no PE. The patient's images have been independently reviewed by me.    PFTs: Never had  Labs:   Lab Results  Component Value Date  WBC 10.7 (H) 07/31/2017   HGB 9.0 (L) 07/31/2017   HCT 28.7 (L) 07/31/2017   MCV 80.8 07/31/2017   PLT 383 07/31/2017   Lab Results  Component Value Date   NA 135 07/31/2017   K 4.1 07/31/2017   CL 102 07/31/2017   CO2 23 07/31/2017     Immunization status:  Immunization History  Administered Date(s) Administered  . Influenza-Unspecified 01/16/2018, 11/25/2018  . Tdap 08/08/2012, 07/29/2017    . I reviewed prior external note(s) from family medicine, hospital stay . I reviewed the result(s) of the labs and imaging as noted above.  . I have ordered PFT, FeNO   Assessment:  Moderate persistent asthma Concern for OSA GERD  Plan/Recommendations:  Is a 35 year old woman with childhood asthma with progression of chest tightness shortness of breath and wheezing.  Interestingly not all of her symptoms are improved with albuterol.  I think there may be comorbid conditions at play here.  She does have nocturnal awakenings consistent with apnea which with daily morning headaches and feeling unrefreshed is very concerning for OSA.  We will order apnea test.  For her asthma we will start her on Symbicort 2 puffs twice a day.  Instructed to use this with a spacer which she has.  She can continue albuterol as needed.  In the meantime we can get a full set of PFTs including exhaled NO test.  She has ongoing reflux which may also be contributing to her coughing and worsening asthma symptoms.  We will start Protonix 40 mg once daily.  We discussed disease  management and progression at length today regarding asthma.  Return to Care: Return in about 2 weeks (around 05/06/2020).  Durel Salts, MD Pulmonary and Critical Care Medicine [facility] [phone]  CC: Mayer Masker, [place]

## 2020-04-22 NOTE — Patient Instructions (Signed)
The patient should have follow up scheduled with myself in 2 weeks.   Prior to next visit patient should have: Full set of PFTs - one hour, follow up with me after  Start taking symbicort 2 puffs in the morning, 2 puffs at night, with spacer.  Take the albuterol rescue inhaler every 4 to 6 hours as needed for wheezing or shortness of breath. You can also take it 15 minutes before exercise or exertional activity. Side effects include heart racing or pounding, jitters or anxiety. If you have a history of an irregular heart rhythm, it can make this worse. Can also give some patients a hard time sleeping.  To inhale the aerosol using an inhaler, follow these steps:  1. Remove the protective dust cap from the end of the mouthpiece. If the dust cap was not placed on the mouthpiece, check the mouthpiece for dirt or other objects. Be sure that the canister is fully and firmly inserted in the mouthpiece. 2. If you are using the inhaler for the first time or if you have not used the inhaler in more than 14 days, you will need to prime it. You may also need to prime the inhaler if it has been dropped. Ask your pharmacist or check the manufacturer's information if this happens. To prime the inhaler, shake it well and then press down on the canister 4 times to release 4 sprays into the air, away from your face. Be careful not to get albuterol in your eyes. 3. Shake the inhaler well. 4. Breathe out as completely as possible through your mouth. 4. Hold the canister with the mouthpiece on the bottom, facing you and the canister pointing upward. Place the open end of the mouthpiece into your mouth. Close your lips tightly around the mouthpiece. 6. Breathe in slowly and deeply through the mouthpiece.At the same time, press down once on the container to spray the medication into your mouth. 7. Try to hold your breath for 10 seconds. remove the inhaler, and breathe out slowly. 8. If you were told to use 2 puffs,  wait 1 minute and then repeat steps 3-7. 9. Replace the protective cap on the inhaler. 10. Clean your inhaler regularly. Follow the manufacturer's directions carefully and ask your doctor or pharmacist if you have any questions about cleaning your inhaler.  Check the back of the inhaler to keep track of the total number of doses left on the inhaler.     By learning about asthma and how it can be controlled, you take an important step toward managing this disease. Work closely with your asthma care team to learn all you can about your asthma, how to avoid triggers, what your medications do, and how to take them correctly. With proper care, you can live free of asthma symptoms and maintain a normal, healthy lifestyle.   What is asthma? Asthma is a chronic disease that affects the airways of the lungs. During normal breathing, the bands of muscle that surround the airways are relaxed and air moves freely. During an asthma episode or "attack," there are three main changes that stop air from moving easily through the airways:  The bands of muscle that surround the airways tighten and make the airways narrow. This tightening is called bronchospasm.   The lining of the airways becomes swollen or inflamed.   The cells that line the airways produce more mucus, which is thicker than normal and clogs the airways.  These three factors - bronchospasm, inflammation, and  mucus production - cause symptoms such as difficulty breathing, wheezing, and coughing.  What are the most common symptoms of asthma? Asthma symptoms are not the same for everyone. They can even change from episode to episode in the same person. Also, you may have only one symptom of asthma, such as cough, but another person may have all the symptoms of asthma. It is important to know all the symptoms of asthma and to be aware that your asthma can present in any of these ways at any time. The most common symptoms include: . Coughing,  especially at night  . Shortness of breath  . Wheezing  . Chest tightness, pain, or pressure   Who is affected by asthma? Asthma affects 22 million Americans; about 6 million of these are children under age 26. People who have a family history of asthma have an increased risk of developing the disease. Asthma is also more common in people who have allergies or who are exposed to tobacco smoke. However, anyone can develop asthma at any time. Some people may have asthma all of their lives, while others may develop it as adults.  What causes asthma? The airways in a person with asthma are very sensitive and react to many things, or "triggers." Contact with these triggers causes asthma symptoms. One of the most important parts of asthma control is to identify your triggers and then avoid them when possible. The only trigger you do not want to avoid is exercise. Pre-treatment with medicines before exercise can allow you to stay active yet avoid asthma symptoms. Common asthma triggers include: 1. Infections (colds, viruses, flu, sinus infections)  2. Exercise  3. Weather (changes in temperature and/or humidity, cold air)  4. Tobacco smoke  5. Allergens (dust mites, pollens, pets, mold spores, cockroaches, and sometimes foods)  6. Irritants (strong odors from cleaning products, perfume, wood smoke, air pollution)  7. Strong emotions such as crying or laughing hard  8. Some medications   How is asthma diagnosed? To diagnose asthma, your doctor will first review your medical history, family history, and symptoms. Your doctor will want to know any past history of breathing problems you may have had, as well as a family history of asthma, allergies, eczema (a bumpy, itchy skin rash caused by allergies), or other lung disease. It is important that you describe your symptoms in detail (cough, wheeze, shortness of breath, chest tightness), including when and how often they occur. The doctor will perform a  physical examination and listen to your heart and lungs. He or she may also order breathing tests, allergy tests, blood tests, and chest and sinus X-rays. The tests will find out if you do have asthma and if there are any other conditions that are contributing factors.  How is asthma treated? Asthma can be controlled, but not cured. It is not normal to have frequent symptoms, trouble sleeping, or trouble completing tasks. Appropriate asthma care will prevent symptoms and visits to the emergency room and hospital. Asthma medicines are one of the mainstays of asthma treatment. The drugs used to treat asthma are explained below.  Anti-inflammatories: These are the most important drugs for most people with asthma. Anti-inflammatory drugs reduce swelling and mucus production in the airways. As a result, airways are less sensitive and less likely to react to triggers. These medications need to be taken daily and may need to be taken for several weeks before they begin to control asthma. Anti-inflammatory medicines lead to fewer symptoms, better airflow, less  sensitive airways, less airway damage, and fewer asthma attacks. If taken every day, they CONTROL or prevent asthma symptoms.   Bronchodilators: These drugs relax the muscle bands that tighten around the airways. This action opens the airways, letting more air in and out of the lungs and improving breathing. Bronchodilators also help clear mucus from the lungs. As the airways open, the mucus moves more freely and can be coughed out more easily. In short-acting forms, bronchodilators RELIEVE or stop asthma symptoms by quickly opening the airways and are very helpful during an asthma episode. In long-acting forms, bronchodilators provide CONTROL of asthma symptoms and prevent asthma episodes.  Asthma drugs can be taken in a variety of ways. Inhaling the medications by using a metered dose inhaler, dry powder inhaler, or nebulizer is one way of taking asthma  medicines. Oral medicines (pills or liquids you swallow) may also be prescribed.  Asthma severity Asthma is classified as either "intermittent" (comes and goes) or "persistent" (lasting). Persistent asthma is further described as being mild, moderate, or severe. The severity of asthma is based on how often you have symptoms both during the day and night, as well as by the results of lung function tests and by how well you can perform activities. The "severity" of asthma refers to how "intense" or "strong" your asthma is.  Asthma control Asthma control is the goal of asthma treatment. Regardless of your asthma severity, it may or may not be controlled. Asthma control means: . You are able to do everything you want to do at work and home  . You have no (or minimal) asthma symptoms  . You do not wake up from your sleep or earlier than usual in the morning due to asthma  . You rarely need to use your reliever medicine (inhaler)  Another major part of your treatment is that you are happy with your asthma care and believe your asthma is controlled.  Monitoring symptoms A key part of treatment is keeping track of how well your lungs are working. Monitoring your symptoms  what they are, how and when they happen, and how severe they are  is an important part of being able to control your asthma.  Sometimes asthma is monitored using a peak flow meter. A peak flow (PF) meter measures how fast the air comes out of your lungs. It can help you know when your asthma is getting worse, sometimes even before you have symptoms. By taking daily peak flow readings, you can learn when to adjust medications to keep asthma under good control. It is also used to create your asthma action plan (see below). Your doctor can use your peak flow readings to adjust your treatment plan in some cases.  Asthma Action Plan Based on your history and asthma severity, you and your doctor will develop a care plan called an "asthma  action plan." The asthma action plan describes when and how to use your medicines, actions to take when asthma worsens, and when to seek emergency care. Make sure you understand this plan. If you do not, ask your asthma care provider any questions you may have. Your asthma action plan is one of the keys to controlling asthma. Keep it readily available to remind you of what you need to do every day to control asthma and what you need to do when symptoms occur.  Goals of asthma therapy These are the goals of asthma treatment: . Live an active, normal life  . Prevent chronic and troublesome  symptoms  . Attend work or school every day  . Perform daily activities without difficulty  . Stop urgent visits to the doctor, emergency department, or hospital  . Use and adjust medications to control asthma with few or no side effects

## 2020-04-23 ENCOUNTER — Telehealth: Payer: BC Managed Care – PPO | Admitting: Physician Assistant

## 2020-04-23 DIAGNOSIS — J4540 Moderate persistent asthma, uncomplicated: Secondary | ICD-10-CM | POA: Diagnosis not present

## 2020-04-23 MED ORDER — ALBUTEROL SULFATE HFA 108 (90 BASE) MCG/ACT IN AERS
2.0000 | INHALATION_SPRAY | RESPIRATORY_TRACT | 0 refills | Status: DC | PRN
Start: 2020-04-23 — End: 2020-05-08

## 2020-04-23 NOTE — Progress Notes (Signed)
Visit for Asthma  Based on what you have shared with me, it looks like you may have a flare up of your asthma.  Asthma is a chronic (ongoing) lung disease which results in airway obstruction, inflammation and hyper-responsiveness.   Asthma symptoms vary from person to person, with common symptoms including nighttime awakening and decreased ability to participate in normal activities as a result of shortness of breath. It is often triggered by changes in weather, changes in the season, changes in air temperature, or inside (home, school, daycare or work) allergens such as animal dander, mold, mildew, woodstoves or cockroaches.   It can also be triggered by hormonal changes, extreme emotion, physical exertion or an upper respiratory tract illness.     It is important to identify the trigger, and then eliminate or avoid the trigger if possible.   If you have been prescribed medications to be taken on a regular basis, it is important to follow the asthma action plan and to follow guidelines to adjust medication in response to increasing symptoms of decreased peak expiratory flow rate  Treatment: I have prescribed: Albuterol (Proventil HFA; Ventolin HFA) 108 (90 Base) MCG/ACT Inhaler 2 puffs into the lungs every six hours as needed for wheezing or shortness of breath  HOME CARE Only take medications as instructed by your medical team. Consider wearing a mask or scarf to improve breathing air temperature have been shown to decrease irritation and decrease exacerbations Get rest. Taking a steamy shower or using a humidifier may help nasal congestion sand ease sore throat pain. You can place a towel over your head and breathe in the steam from hot water coming from a faucet. Using a saline nasal spray works much the same way.  Cough drops, hare candies and sore throat lozenges may ease your  cough.  Avoid close contacts especially the very you and the elderly Cover your mouth if you cough or sneeze Always remember to wash your hands.    GET HELP RIGHT AWAY IF: You develop worsening symptoms; breathlessness at rest, drowsy, confused or agitated, unable to speak in full sentences You have coughing fits You develop a severe headache or visual changes You develop shortness of breath, difficulty breathing or start having chest pain Your symptoms persist after you have completed your treatment plan If your symptoms do not improve within 10 days  MAKE SURE YOU Understand these instructions. Will watch your condition. Will get help right away if you are not doing well or get worse.   Your e-visit answers were reviewed by a board certified advanced clinical practitioner to complete your personal care plan, Depending upon the condition, your plan could have included both over the counter or prescription medications.   Please review your pharmacy choice. Your safety is important to us. If you have drug allergies check your prescription carefully.  You can use MyChart to ask questions about today's visit, request a non-urgent  call back, or ask for a work or school excuse for 24 hours related to this e-Visit. If it has been greater than 24 hours you will need to follow up with your provider, or enter a new e-Visit to address those concerns.   You will get an e-mail in the next two days asking about your experience. I hope that your e-visit has been valuable and will speed your recovery. Thank you for using e-visits.  I provided 5 minutes of non face-to-face time during this encounter for chart review and documentation.   

## 2020-05-08 ENCOUNTER — Telehealth: Payer: BC Managed Care – PPO | Admitting: Nurse Practitioner

## 2020-05-08 DIAGNOSIS — J4540 Moderate persistent asthma, uncomplicated: Secondary | ICD-10-CM | POA: Diagnosis not present

## 2020-05-08 MED ORDER — ALBUTEROL SULFATE HFA 108 (90 BASE) MCG/ACT IN AERS: 2.0000 | INHALATION_SPRAY | RESPIRATORY_TRACT | 0 refills | Status: DC | PRN

## 2020-05-08 NOTE — Progress Notes (Signed)
Visit for Asthma  Based on what you have shared with me, it looks like you may have a flare up of your asthma.  Asthma is a chronic (ongoing) lung disease which results in airway obstruction, inflammation and hyper-responsiveness.   Asthma symptoms vary from person to person, with common symptoms including nighttime awakening and decreased ability to participate in normal activities as a result of shortness of breath. It is often triggered by changes in weather, changes in the season, changes in air temperature, or inside (home, school, daycare or work) allergens such as animal dander, mold, mildew, woodstoves or cockroaches.   It can also be triggered by hormonal changes, extreme emotion, physical exertion or an upper respiratory tract illness.     It is important to identify the trigger, and then eliminate or avoid the trigger if possible.   If you have been prescribed medications to be taken on a regular basis, it is important to follow the asthma action plan and to follow guidelines to adjust medication in response to increasing symptoms of decreased peak expiratory flow rate  Treatment: I have prescribed: Albuterol (Proventil HFA; Ventolin HFA) 108 (90 Base) MCG/ACT Inhaler 2 puffs into the lungs every six hours as needed for wheezing or shortness of breath   * this looks like it is a re occurrent issus and you are using your albuterol to much. You need to see your PCP in order to be placed on a different  maintenance inhaler.  HOME CARE . Only take medications as instructed by your medical team. . Consider wearing a mask or scarf to improve breathing air temperature have been shown to decrease irritation and decrease exacerbations . Get rest. . Taking a steamy shower or using a humidifier may help nasal congestion sand ease sore throat pain. You can place a towel over your head and  breathe in the steam from hot water coming from a faucet. . Using a saline nasal spray works much the same way.  . Cough drops, hare candies and sore throat lozenges may ease your cough.  . Avoid close contacts especially the very you and the elderly . Cover your mouth if you cough or sneeze . Always remember to wash your hands.    GET HELP RIGHT AWAY IF: . You develop worsening symptoms; breathlessness at rest, drowsy, confused or agitated, unable to speak in full sentences . You have coughing fits . You develop a severe headache or visual changes . You develop shortness of breath, difficulty breathing or start having chest pain . Your symptoms persist after you have completed your treatment plan . If your symptoms do not improve within 10 days  MAKE SURE YOU . Understand these instructions. . Will watch your condition. . Will get help right away if you are not doing well or get worse.   Your e-visit answers were reviewed by a board certified advanced clinical practitioner to complete your personal care plan, Depending upon the condition, your plan could have included both over the counter or prescription medications.  Please review your pharmacy choice. Your safety is important to [place]. If you have drug allergies check your prescription carefully. You can use MyChart to ask questions about today's visit, request a non-urgent call back, or ask for a work or school excuse for 24 hours related to this e-Visit. If it has been greater than 24 hours you will need to follow up with your provider, or enter a new e-Visit to address those concerns.  You will  get an e-mail in the next two days asking about your experience. I hope that your e-visit has been valuable and will speed your recovery. Thank you for using e-visits.  5-10 minutes spent reviewing and documenting in chart.

## 2020-05-10 ENCOUNTER — Other Ambulatory Visit: Payer: Self-pay

## 2020-05-10 ENCOUNTER — Ambulatory Visit: Payer: BC Managed Care – PPO

## 2020-05-10 DIAGNOSIS — G4733 Obstructive sleep apnea (adult) (pediatric): Secondary | ICD-10-CM | POA: Diagnosis not present

## 2020-05-14 DIAGNOSIS — G4733 Obstructive sleep apnea (adult) (pediatric): Secondary | ICD-10-CM | POA: Diagnosis not present

## 2020-05-31 ENCOUNTER — Other Ambulatory Visit: Payer: Self-pay

## 2020-05-31 ENCOUNTER — Telehealth: Payer: BC Managed Care – PPO | Admitting: Nurse Practitioner

## 2020-05-31 DIAGNOSIS — J4540 Moderate persistent asthma, uncomplicated: Secondary | ICD-10-CM

## 2020-05-31 NOTE — Progress Notes (Signed)
Based on what you shared with me, I feel your condition warrants further evaluation and I recommend that you be seen for a face to face visit.  Please contact your primary care physician practice to be seen. Many offices offer virtual options to be seen via video if you are not comfortable going in person to a medical facility at this time.  You have been recieviing albuterol/proair prescriptions to frequently. This is a rescue inhaler and should only be used as such. If you are using it more then 2-3 x a week. An inhaler should last you longer then a month, and your lasr refill was on 5/7 22.You need a different maintenance inhaler. Please contact your PCP for an appointmentt tomorrow.  If you do not have a PCP, [facility] offers a free physician referral service available at [phone]. Our trained staff has the experience, knowledge and resources to put you in touch with a physician who is right for you.   You also have the option of a video visit through [email or web address]  If you are having a true medical emergency please call 911.  NOTE: If you entered your credit card information for this eVisit, you will not be charged. You may see a "hold" on your card for the $35 but that hold will drop off and you will not have a charge processed.  Your e-visit answers were reviewed by a board certified advanced clinical practitioner to complete your personal care plan.  Thank you for using e-Visits.

## 2020-06-04 ENCOUNTER — Other Ambulatory Visit (HOSPITAL_COMMUNITY): Payer: BC Managed Care – PPO

## 2020-06-07 ENCOUNTER — Ambulatory Visit: Payer: BC Managed Care – PPO | Admitting: Internal Medicine

## 2020-06-15 ENCOUNTER — Telehealth: Payer: BC Managed Care – PPO | Admitting: Family

## 2020-06-15 DIAGNOSIS — J45909 Unspecified asthma, uncomplicated: Secondary | ICD-10-CM

## 2020-06-16 NOTE — Progress Notes (Signed)
Based on what you shared with me, I feel your condition warrants further evaluation and I recommend that you be seen in a face to face office visit.  Given your symptoms of recurrent asthmas flare ups you need to be seen face to face today! I recommend going to the Urgent Care this morning.    NOTE: If you entered your credit card information for this eVisit, you will not be charged. You may see a "hold" on your card for the $35 but that hold will drop off and you will not have a charge processed.   If you are having a true medical emergency please call 911.      For an urgent face to face visit, [facility] has six urgent care centers for your convenience:     [facility] at [facility] Directions [phone] [address] [place], [place] [address] 8 am - 4 pm Monday - Friday    [facility] Urgent Care Center [facility]) Get Driving Directions [phone] [address] [place], [place] [address] 8 am to 8 pm Monday-Friday 10 am to 6 pm [facility] Urgent Care Center [facility]) Get Driving Directions [phone]  [address] [place],  [place]  [address] 8 am to 8 pm Monday-Friday 8 am to 4 pm [facility] Urgent Care at [facility] Get Driving Directions [phone] [address], [address] [place], [place] [address] 8 am to 8 pm Monday-Friday 8 am to 4 pm [facility] Urgent Care at [facility] Get Driving Directions  [phone] [address] [place], [place] [address] 8 am to 8 pm Monday-Friday 8 am to 4 pm [facility] Urgent Care at [facility] Directions [phone] [address] [place], [place] [address] 8 am to 8 pm Monday-Friday 8 am to 4 pm Saturday-Sunday     Your MyChart E-visit questionnaire answers were reviewed by a board certified advanced clinical  practitioner to complete your personal care plan based on your specific symptoms.  Thank you for using e-Visits.

## 2020-06-18 ENCOUNTER — Telehealth: Payer: BC Managed Care – PPO | Admitting: Nurse Practitioner

## 2020-06-18 ENCOUNTER — Ambulatory Visit
Admission: EM | Admit: 2020-06-18 | Discharge: 2020-06-18 | Disposition: A | Discharge status: Home / Self Care | Payer: BC Managed Care – PPO

## 2020-06-18 ENCOUNTER — Other Ambulatory Visit: Payer: Self-pay

## 2020-06-18 DIAGNOSIS — J45901 Unspecified asthma with (acute) exacerbation: Secondary | ICD-10-CM | POA: Diagnosis not present

## 2020-06-18 DIAGNOSIS — J069 Acute upper respiratory infection, unspecified: Secondary | ICD-10-CM

## 2020-06-18 DIAGNOSIS — J40 Bronchitis, not specified as acute or chronic: Secondary | ICD-10-CM | POA: Diagnosis not present

## 2020-06-18 MED ORDER — AZITHROMYCIN 250 MG PO TABS: ORAL_TABLET | ORAL | 0 refills | Status: AC

## 2020-06-18 MED ORDER — ALBUTEROL SULFATE (2.5 MG/3ML) 0.083% IN NEBU
2.5000 mg | INHALATION_SOLUTION | Freq: Four times a day (QID) | RESPIRATORY_TRACT | 0 refills | Status: DC | PRN
Start: 2020-06-18 — End: 2020-11-09

## 2020-06-18 MED ORDER — ALBUTEROL SULFATE HFA 108 (90 BASE) MCG/ACT IN AERS: 2.0000 | INHALATION_SPRAY | Freq: Four times a day (QID) | RESPIRATORY_TRACT | 0 refills | Status: DC | PRN

## 2020-06-18 NOTE — Progress Notes (Signed)
Virtual Visit Consent   Debra Horn, you are scheduled for a virtual visit with a [facility] provider today.     Just as with appointments in the office, your consent must be obtained to participate.  Your consent will be active for this visit and any virtual visit you may have with one of our providers in the next 365 days.     If you have a MyChart account, a copy of this consent can be sent to you electronically.  All virtual visits are billed to your insurance company just like a traditional visit in the office.    As this is a virtual visit, video technology does not allow for your provider to perform a traditional examination.  This may limit your provider's ability to fully assess your condition.  If your provider identifies any concerns that need to be evaluated in person or the need to arrange testing (such as labs, EKG, etc.), we will make arrangements to do so.     Although advances in technology are sophisticated, we cannot ensure that it will always work on either your end or our end.  If the connection with a video visit is poor, the visit may have to be switched to a telephone visit.  With either a video or telephone visit, we are not always able to ensure that we have a secure connection.     I need to obtain your verbal consent now.   Are you willing to proceed with your visit today?    Debra Horn has provided verbal consent on 06/18/2020 for a virtual visit (video).   Debra Simas, FNP   Date: 06/18/2020 4:31 PM   Virtual Visit via Video Note   I, Debra Horn, connected with Debra Horn  on 06/18/20 at  4:45 PM EDT by a video-enabled telemedicine application and verified that I am speaking with the correct person using two identifiers.  Location: Patient: Virtual Visit Location Patient: Mobile Provider: Virtual Visit Location Provider: Home Office   I discussed the limitations of evaluation and management by telemedicine and the availability of in  person appointments. The patient expressed understanding and agreed to proceed.    History of Present Illness:  Debra Horn is a 35 y.o. who identifies as a female who was assigned female at birth, and is being seen today for complaints of asthma exacerbation secondary to recent URI. She has been sick for the past 3-4 weeks, had flu like symptoms that improved, then caught another cold from one of her children. Has taken home COVID tests throughout time of illness and has remained negative, most recent COVID test was last night.   She has been using her nebulizer with albuterol at home for relief.   Denies any recent fever.   Has also had some nausea and diarrhea in recent days. Is able to tolerate fluids and foods. Coughing at time causes vomiting.  She is urinating. Has little appetite    Patient Active Problem List   Diagnosis Date Noted   Status post repeat low transverse cesarean section 07/27/2017   Obesity affecting pregnancy in third trimester    Poor fetal growth affecting management of mother in third trimester    Previous cesarean delivery affecting pregnancy    Previous pregnancy complicated by pregnancy-induced hypertension in third trimester, antepartum    [redacted] weeks gestation of pregnancy    Unwanted fertility 06/06/2017   IUGR (intrauterine growth restriction) in prior pregnancy, pregnant 03/06/2017   Vitamin  D deficiency 02/17/2017   Hx of preeclampsia, prior pregnancy, currently pregnant 02/09/2017   History of preterm delivery 02/09/2017   History of asthma    Normocytic anemia 02/06/2016   Family history of systemic lupus erythematosus (SLE) in mother 02/19/2014   Hyperthyroidism 02/19/2014   Previous cesarean section 08/08/2012   Hx pulmonary embolism 07/24/2012   Supervision of high-risk pregnancy 07/24/2012    Allergies:  Allergies  Allergen Reactions   Banana Anaphylaxis   Cefixime Rash   Medications:  Albuterol nebulizer    Observations/Objective: Patient is well-developed, well-nourished in no acute distress.  Resting comfortably in her car  Head is normocephalic, atraumatic.  No labored breathing. No SOB able to carry on conversation without any shortness or breath, cough witnessed - not severe  Speech is clear and coherent with logical content.  Patient is alert and oriented at baseline.    Assessment and Plan:  Due to length of illness with productive cough component will move forward with antibiotics at this time.  Advised using nebulizer as choice of albuterol delivery when able, will send in [facility] to use when not at home.   Advised OTC Mucinex to help mobilize secretions use as directed by package.   Continue to monitor symptoms, if unable to get relief from albuterol, acute shortness of breath occurs or with other new or acutely worsening symptoms patient has been advised to seek immediate medical attention.   Patient has an appointment with Pulmonology in 6 days.   Follow Up Instructions: I discussed the assessment and treatment plan with the patient. The patient was provided an opportunity to ask questions and all were answered. The patient agreed with the plan and demonstrated an understanding of the instructions.  A copy of instructions were sent to the patient via MyChart.  The patient was advised to call back or seek an in-person evaluation if the symptoms worsen or if the condition fails to improve as anticipated.  Patient aware to use either the HFA or the nebulizer every 4-6. Explained that this is the same medication in both delivery devices.   Meds ordered this encounter  Medications   azithromycin (ZITHROMAX) 250 MG tablet    Sig: Take 2 tablets on day 1, then 1 tablet daily on days 2 through 5    Dispense:  6 tablet    Refill:  0   albuterol (VENTOLIN HFA) 108 (90 Base) MCG/ACT inhaler    Sig: Inhale 2 puffs into the lungs every 6 (six) hours as needed for wheezing or shortness  of breath.    Dispense:  8 g    Refill:  0   albuterol (PROVENTIL) (2.5 MG/3ML) 0.083% nebulizer solution    Sig: Take 3 mLs (2.5 mg total) by nebulization every 6 (six) hours as needed for wheezing or shortness of breath.    Dispense:  150 mL    Refill:  0    Time:  I spent 15 minutes with the patient via telehealth technology discussing the above problems/concerns.    Debra Simas, FNP

## 2020-06-21 ENCOUNTER — Other Ambulatory Visit (HOSPITAL_COMMUNITY): Payer: BC Managed Care – PPO

## 2020-06-24 ENCOUNTER — Ambulatory Visit (INDEPENDENT_AMBULATORY_CARE_PROVIDER_SITE_OTHER): Payer: BC Managed Care – PPO | Admitting: Primary Care

## 2020-06-24 ENCOUNTER — Telehealth: Payer: Self-pay | Admitting: Internal Medicine

## 2020-06-24 ENCOUNTER — Other Ambulatory Visit: Payer: Self-pay

## 2020-06-24 ENCOUNTER — Encounter: Payer: Self-pay | Admitting: Primary Care

## 2020-06-24 ENCOUNTER — Ambulatory Visit (INDEPENDENT_AMBULATORY_CARE_PROVIDER_SITE_OTHER): Payer: BC Managed Care – PPO | Admitting: Internal Medicine

## 2020-06-24 VITALS — BP 118/64 | HR 69 | Temp 98.5°F | Ht 63.0 in | Wt 255.4 lb

## 2020-06-24 DIAGNOSIS — R0602 Shortness of breath: Secondary | ICD-10-CM | POA: Diagnosis not present

## 2020-06-24 DIAGNOSIS — J4530 Mild persistent asthma, uncomplicated: Secondary | ICD-10-CM | POA: Insufficient documentation

## 2020-06-24 DIAGNOSIS — G4730 Sleep apnea, unspecified: Secondary | ICD-10-CM | POA: Diagnosis not present

## 2020-06-24 DIAGNOSIS — R638 Other symptoms and signs concerning food and fluid intake: Secondary | ICD-10-CM | POA: Diagnosis not present

## 2020-06-24 DIAGNOSIS — G4733 Obstructive sleep apnea (adult) (pediatric): Secondary | ICD-10-CM | POA: Diagnosis not present

## 2020-06-24 DIAGNOSIS — Z6841 Body Mass Index (BMI) 40.0 and over, adult: Secondary | ICD-10-CM | POA: Diagnosis not present

## 2020-06-24 DIAGNOSIS — J45901 Unspecified asthma with (acute) exacerbation: Secondary | ICD-10-CM

## 2020-06-24 LAB — PULMONARY FUNCTION TEST
DL/VA % pred: 95 %
DL/VA: 4.36 ml/min/mmHg/L
DLCO cor % pred: 97 %
DLCO cor: 20.97 ml/min/mmHg
DLCO unc % pred: 97 %
DLCO unc: 20.97 ml/min/mmHg
FEF 25-75 Post: 2.62 L/sec
FEF 25-75 Pre: 2.7 L/sec
FEF2575-%Change-Post: -2 %
FEF2575-%Pred-Post: 87 %
FEF2575-%Pred-Pre: 90 %
FEV1-%Change-Post: -1 %
FEV1-%Pred-Post: 103 %
FEV1-%Pred-Pre: 104 %
FEV1-Post: 2.65 L
FEV1-Pre: 2.68 L
FEV1FVC-%Change-Post: -1 %
FEV1FVC-%Pred-Pre: 96 %
FEV6-%Change-Post: 0 %
FEV6-%Pred-Post: 109 %
FEV6-%Pred-Pre: 108 %
FEV6-Post: 3.3 L
FEV6-Pre: 3.28 L
FEV6FVC-%Pred-Post: 101 %
FEV6FVC-%Pred-Pre: 101 %
FVC-%Change-Post: 0 %
FVC-%Pred-Post: 108 %
FVC-%Pred-Pre: 107 %
FVC-Post: 3.3 L
FVC-Pre: 3.28 L
Post FEV1/FVC ratio: 80 %
Post FEV6/FVC ratio: 100 %
Pre FEV1/FVC ratio: 82 %
Pre FEV6/FVC Ratio: 100 %
RV % pred: 152 %
RV: 2.16 L
TLC % pred: 112 %
TLC: 5.49 L

## 2020-06-24 LAB — NITRIC OXIDE: Nitric Oxide: 36

## 2020-06-24 MED ORDER — FLUTICASONE PROPIONATE HFA 110 MCG/ACT IN AERO: 2.0000 | INHALATION_SPRAY | Freq: Two times a day (BID) | RESPIRATORY_TRACT | 5 refills | Status: DC

## 2020-06-24 NOTE — Assessment & Plan Note (Signed)
-   Refer to healthy weight and wellness 

## 2020-06-24 NOTE — Addendum Note (Signed)
Addended by: Charlott Holler on: 06/24/2020 01:28 PM   Modules accepted: Orders

## 2020-06-24 NOTE — Progress Notes (Signed)
Full PFT completed today ? ?

## 2020-06-24 NOTE — Patient Instructions (Addendum)
Your breathing test today was normal FENO was 36 today, this was elevated  Recommendations: - Stop Symbicort  - Start Flovent two puffs twice a day  - Start Singulair 10mg  at bedtime - Use albuterol inhaler 2 puff every 6 hour as needed for shortness of breath/wheezing  Orders: - New CPAP start 5-20cm h20   Referral: - Healthy weight and wellness   Follow-up: - 3 months with Dr. or sooner if needed

## 2020-06-24 NOTE — Telephone Encounter (Signed)
Pt stated that when at pharmacy after appt today they only had; flovent but the other two medications discussed in the OV were not sent. Pharmacy; [id number] - [place], [place] - [address]  Pls regard; [phone]

## 2020-06-24 NOTE — Telephone Encounter (Signed)
Patient called stating that only the Flovent inhaler was sent into the pharmacy today. She states that you discussed 2 other medications to start. I looked at office notes and see that Singulair is listed to start but do not see the second medication. Patient already has an order for albuterol inhaler that was sent to pharmacy on 6/17.   Is it ok to send Singulair in? What is the second medication you discussed?    Beth please advise.  Thank you.

## 2020-06-24 NOTE — Assessment & Plan Note (Addendum)
-   HST on 05/10/20 showed severe OSA, AHI 33.6/hr with SpO2 low 63% (baseline 93%) - Placing DME order for CPAP auto titrate 5-20cm h20  - Advised she work on weight loss efforts and aim to wear CPAP every night 4-6 hours or longer  - FU in 31-90 days after receiving machine

## 2020-06-24 NOTE — Assessment & Plan Note (Signed)
-   Strong smells and smoke trigger asthma symptoms. She did not tolerate Symbicort, reports worsening shortness of breath after use - Pulmonary function testing today was normal; FENO was elevated 36  - Recommend starting Flovent 2puffs BID and Singulair 10mg  qhs  - Use albuterol inhaler 2 puff every 6 hour as needed for shortness of breath/wheezing - FU in 3 months with Dr. or sooner if needed

## 2020-06-24 NOTE — Progress Notes (Addendum)
@Patient  ID: , female    DOB: [birth year], 35 y.o.   MRN: [id number]  Chief Complaint  Patient presents with   Follow-up    Had PFT today-    Referring provider: [id number], PA-C  HPI: 35 year old female, never smoked. PMH significant for asthma, hypothyroidism, pulmonary embolism.  Patient of Dr. 20, seen for initial consult on 04/22/2020.  06/24/2020 Patient presents today for 2 month follow-up with PFTs. She was started on Symbicort and prn albuterol. She states that her shortness of breath was worse after taking Symbicort so she stopped. Her PFTs were normal today. She reports that smells and smoke with trigger asthma symptoms. She wakes up in the middle of the night feeling as though she can not breath. She has morning headaches. Her home sleep study in May showed severe OSA. We discussed treatment options including weight loss, oral appliance, CPAP or referral to ENT.   Pulmonary testing: 06/24/2020 PFTs- FVC 3.30 (108%), FEV1 2.65 (103%), ratio 80, TLC 112%, DLCOunc 97% Normal Spirometry without BD response. Normal diffusion capacity   06/24/2020 FENO - 36   Allergies  Allergen Reactions   Banana Anaphylaxis   Cefixime Rash    Immunization History  Administered Date(s) Administered   Influenza-Unspecified 01/16/2018, 11/25/2018   PFIZER Comirnaty(Gray Top)Covid-19 Tri-Sucrose Vaccine 08/30/2019, 09/27/2019   Tdap 08/08/2012, 07/29/2017    Past Medical History:  Diagnosis Date   Anemia    low iron   Asthma    Complication of anesthesia    bp dropped in middle of last c-section and started with n/v   GERD (gastroesophageal reflux disease)    during pregnancy   Headache    "couple days/week" (10/28/2015)   Hyperthyroidism    not on medication   PE (pulmonary embolism) 11/2011   PONV (postoperative nausea and vomiting)    Pregnancy induced hypertension    Preterm labor     Tobacco History: Social History   Tobacco Use  Smoking Status  Never  Smokeless Tobacco Never   Counseling given: Not Answered   Outpatient Medications Prior to Visit  Medication Sig Dispense Refill   albuterol (PROVENTIL) (2.5 MG/3ML) 0.083% nebulizer solution Take 3 mLs (2.5 mg total) by nebulization every 6 (six) hours as needed for wheezing or shortness of breath. 150 mL 0   albuterol (VENTOLIN HFA) 108 (90 Base) MCG/ACT inhaler Inhale 2 puffs into the lungs every 6 (six) hours as needed for wheezing or shortness of breath. 8 g 0   pantoprazole (PROTONIX) 40 MG tablet Take 1 tablet (40 mg total) by mouth daily. 30 tablet 5   budesonide-formoterol (SYMBICORT) 160-4.5 MCG/ACT inhaler Inhale 2 puffs into the lungs in the morning and at bedtime. (Patient not taking: Reported on 06/24/2020) 1 each 12   No facility-administered medications prior to visit.    Review of Systems  Review of Systems  Constitutional: Negative.   HENT:  Positive for postnasal drip.   Respiratory:  Positive for shortness of breath. Negative for cough.     Physical Exam  BP 118/64 (BP Location: Left Arm, Cuff Size: Large)   Pulse 69   Temp 98.5 F (36.9 C) (Temporal)   Ht 5\' 3"  (1.6 m)   Wt 255 lb 6.4 oz (115.8 kg)   SpO2 98%   BMI 45.24 kg/m  Physical Exam Constitutional:      Appearance: Normal appearance.  HENT:     Head: Normocephalic and atraumatic.     Nose:  Comments: Deviated septum    Mouth/Throat:     Mouth: Mucous membranes are moist.     Pharynx: Oropharynx is clear.  Cardiovascular:     Rate and Rhythm: Normal rate and regular rhythm.  Pulmonary:     Effort: Pulmonary effort is normal.     Breath sounds: Normal breath sounds. No wheezing.     Comments: CTA Skin:    General: Skin is warm and dry.  Neurological:     General: No focal deficit present.     Mental Status: She is alert and oriented to person, place, and time. Mental status is at baseline.  Psychiatric:        Mood and Affect: Mood normal.        Behavior: Behavior normal.         Thought Content: Thought content normal.        Judgment: Judgment normal.     Lab Results:  CBC    Component Value Date/Time   WBC 10.7 (H) 07/31/2017 1441   RBC 3.55 (L) 07/31/2017 1441   HGB 9.0 (L) 07/31/2017 1441   HGB 11.3 02/09/2017 1233   HCT 28.7 (L) 07/31/2017 1441   HCT 34.5 02/09/2017 1233   PLT 383 07/31/2017 1441   PLT 378 02/09/2017 1233   MCV 80.8 07/31/2017 1441   MCV 85 02/09/2017 1233   MCH 25.4 (L) 07/31/2017 1441   MCHC 31.4 07/31/2017 1441   RDW 17.7 (H) 07/31/2017 1441   RDW 16.3 (H) 02/09/2017 1233   LYMPHSABS 2.8 02/09/2017 1233   MONOABS 0.7 06/26/2013 0055   EOSABS 0.5 (H) 02/09/2017 1233   BASOSABS 0.0 02/09/2017 1233    BMET    Component Value Date/Time   NA 135 07/31/2017 1441   NA 139 02/09/2017 1233   K 4.1 07/31/2017 1441   CL 102 07/31/2017 1441   CO2 23 07/31/2017 1441   GLUCOSE 90 07/31/2017 1441   BUN 8 07/31/2017 1441   BUN 4 (L) 02/09/2017 1233   CREATININE 0.61 07/31/2017 1441   CALCIUM 8.8 (L) 07/31/2017 1441   GFRNONAA >60 07/31/2017 1441   GFRAA >60 07/31/2017 1441    BNP No results found for: BNP  ProBNP No results found for: PROBNP  Imaging: No results found.   Assessment & Plan:   Mild persistent asthma - Strong smells and smoke trigger asthma symptoms. She did not tolerate Symbicort, reports worsening shortness of breath after use - Pulmonary function testing today was normal; FENO was elevated 36  - Recommend starting Flovent 2puffs BID and Singulair 10mg  qhs  - Use albuterol inhaler 2 puff every 6 hour as needed for shortness of breath/wheezing - FU in 3 months with Dr. or sooner if needed   Severe sleep apnea - HST on 05/10/20 showed severe OSA, AHI 33.6/hr with SpO2 low 63% (baseline 93%) - Placing DME order for CPAP auto titrate 5-20cm h20  - Advised she work on weight loss efforts and aim to wear CPAP every night 4-6 hours or longer  - FU in 31-90 days after receiving machine    BMI 45.0-49.9, adult (HCC) - Refer to healthy weight and wellness  >30 mins spent face-to-face with patient  05-28-1983, NP 06/24/2020

## 2020-06-25 MED ORDER — ALBUTEROL SULFATE HFA 108 (90 BASE) MCG/ACT IN AERS: 2.0000 | INHALATION_SPRAY | Freq: Four times a day (QID) | RESPIRATORY_TRACT | 6 refills | Status: DC | PRN

## 2020-06-25 MED ORDER — MONTELUKAST SODIUM 10 MG PO TABS: 10.0000 mg | ORAL_TABLET | Freq: Every day | ORAL | 11 refills | Status: DC

## 2020-06-25 NOTE — Telephone Encounter (Signed)
I apologize, thought I sent. I have sent in RX for singulair 10mg  at bedtime.

## 2020-06-25 NOTE — Telephone Encounter (Signed)
Called spoke with patient. Let her know Beth sent in RX. She was also asking about the albuterol inhaler. Didn't look like that was sent it so I have sent it in with refills for patients to preferred pharmacy.  Nothing further needed at this time.

## 2020-06-25 NOTE — Addendum Note (Signed)
Addended by: Glenford Bayley on: 06/25/2020 08:56 AM   Modules accepted: Orders

## 2020-08-05 ENCOUNTER — Encounter: Payer: Self-pay | Admitting: Physician Assistant

## 2020-08-05 ENCOUNTER — Telehealth: Payer: BC Managed Care – PPO | Admitting: Physician Assistant

## 2020-08-05 NOTE — Progress Notes (Signed)
Erroneous encounter. Patient was attempting to complete a visit for her son but was signed into the wrong MyChart. - No charge.

## 2020-08-15 ENCOUNTER — Other Ambulatory Visit: Payer: Self-pay

## 2020-08-15 ENCOUNTER — Emergency Department (HOSPITAL_COMMUNITY)
Admission: EM | Admit: 2020-08-15 | Discharge: 2020-08-16 | Disposition: A | Discharge status: Home / Self Care | Payer: BC Managed Care – PPO | Attending: Emergency Medicine | Admitting: Emergency Medicine

## 2020-08-15 ENCOUNTER — Encounter (HOSPITAL_COMMUNITY): Payer: Self-pay

## 2020-08-15 DIAGNOSIS — R519 Headache, unspecified: Secondary | ICD-10-CM | POA: Insufficient documentation

## 2020-08-15 DIAGNOSIS — N9489 Other specified conditions associated with female genital organs and menstrual cycle: Secondary | ICD-10-CM | POA: Insufficient documentation

## 2020-08-15 DIAGNOSIS — R55 Syncope and collapse: Secondary | ICD-10-CM | POA: Insufficient documentation

## 2020-08-15 DIAGNOSIS — R42 Dizziness and giddiness: Secondary | ICD-10-CM | POA: Diagnosis not present

## 2020-08-15 DIAGNOSIS — Z5321 Procedure and treatment not carried out due to patient leaving prior to being seen by health care provider: Secondary | ICD-10-CM | POA: Diagnosis not present

## 2020-08-15 DIAGNOSIS — M542 Cervicalgia: Secondary | ICD-10-CM | POA: Insufficient documentation

## 2020-08-15 DIAGNOSIS — R079 Chest pain, unspecified: Secondary | ICD-10-CM | POA: Insufficient documentation

## 2020-08-15 LAB — URINALYSIS, ROUTINE W REFLEX MICROSCOPIC
Bilirubin Urine: NEGATIVE
Glucose, UA: NEGATIVE mg/dL
Hgb urine dipstick: NEGATIVE
Ketones, ur: NEGATIVE mg/dL
Leukocytes,Ua: NEGATIVE
Nitrite: NEGATIVE
Protein, ur: NEGATIVE mg/dL
Specific Gravity, Urine: 1.024 (ref 1.005–1.030)
pH: 5 (ref 5.0–8.0)

## 2020-08-15 LAB — COMPREHENSIVE METABOLIC PANEL
ALT: 21 U/L (ref 0–44)
AST: 18 U/L (ref 15–41)
Albumin: 4 g/dL (ref 3.5–5.0)
Alkaline Phosphatase: 104 U/L (ref 38–126)
Anion gap: 8 (ref 5–15)
BUN: 15 mg/dL (ref 6–20)
CO2: 27 mmol/L (ref 22–32)
Calcium: 9.9 mg/dL (ref 8.9–10.3)
Chloride: 104 mmol/L (ref 98–111)
Creatinine, Ser: 0.6 mg/dL (ref 0.44–1.00)
GFR, Estimated: >60 mL/min (ref >60)
Glucose, Bld: 91 mg/dL (ref 70–99)
Potassium: 3.8 mmol/L (ref 3.5–5.1)
Sodium: 139 mmol/L (ref 135–145)
Total Bilirubin: 0.5 mg/dL (ref 0.3–1.2)
Total Protein: 8.6 g/dL — ABNORMAL HIGH (ref 6.5–8.1)

## 2020-08-15 LAB — HCG, QUANTITATIVE, PREGNANCY: hCG, Beta Chain, Quant, S: 1 m[IU]/mL (ref <5)

## 2020-08-15 LAB — CBC WITH DIFFERENTIAL/PLATELET
Abs Immature Granulocytes: 0.03 10*3/uL (ref 0.00–0.07)
Basophils Absolute: 0 10*3/uL (ref 0.0–0.1)
Basophils Relative: 0 %
Eosinophils Absolute: 0.1 10*3/uL (ref 0.0–0.5)
Eosinophils Relative: 1 %
HCT: 36.6 % (ref 36.0–46.0)
Hemoglobin: 10.9 g/dL — ABNORMAL LOW (ref 12.0–15.0)
Immature Granulocytes: 0 %
Lymphocytes Relative: 36 %
Lymphs Abs: 3.3 10*3/uL (ref 0.7–4.0)
MCH: 24 pg — ABNORMAL LOW (ref 26.0–34.0)
MCHC: 29.8 g/dL — ABNORMAL LOW (ref 30.0–36.0)
MCV: 80.6 fL (ref 80.0–100.0)
Monocytes Absolute: 0.6 10*3/uL (ref 0.1–1.0)
Monocytes Relative: 7 %
Neutro Abs: 5.1 10*3/uL (ref 1.7–7.7)
Neutrophils Relative %: 56 %
Platelets: 586 10*3/uL — ABNORMAL HIGH (ref 150–400)
RBC: 4.54 MIL/uL (ref 3.87–5.11)
RDW: 16.7 % — ABNORMAL HIGH (ref 11.5–15.5)
WBC: 9.2 10*3/uL (ref 4.0–10.5)
nRBC: 0 % (ref 0.0–0.2)

## 2020-08-15 LAB — D-DIMER, QUANTITATIVE: D-Dimer, Quant: <0.27 ug/mL-FEU (ref 0.00–0.50)

## 2020-08-15 NOTE — ED Triage Notes (Signed)
Pt reports having dizziness all day and states that she passed out. Pt reports pain to her neck.

## 2020-08-15 NOTE — ED Provider Notes (Cosign Needed)
Emergency Medicine Provider Triage Evaluation Note  Debra Horn , a 35 y.o. female  was evaluated in triage.  Pt complains of syncopal episode, headache, neck pain, chest pain.  Symptoms started this morning.  She had a headache that was mild.  She gets headaches frequently.  No thunderclap headache or worst headache of life.  She states that she stood up from bed and had a syncopal episode.  Throughout the rest of the day she has had some chest pain with radiation to the left neck.  Previous blood clot, thought due to OCP.  Not currently on anticoagulation  Review of Systems  Positive: Chest pain, syncope Negative: fever  Physical Exam  BP (!) 133/95 (BP Location: Left Arm)   Pulse 80   Temp (!) 97.5 F (36.4 C) (Oral)   Resp 16   SpO2 98%  Gen:   Awake, no distress   Resp:  Normal effort  MSK:   Moves extremities without difficulty  Other:  Cardiac RRR  Medical Decision Making  Medically screening exam initiated at 8:25 PM.  Appropriate orders placed.  Debra Horn was informed that the remainder of the evaluation will be completed by another provider, this initial triage assessment does not replace that evaluation, and the importance of remaining in the ED until their evaluation is complete.     Renne Crigler, PA-C 08/15/20 2026

## 2020-08-17 ENCOUNTER — Telehealth: Payer: Self-pay

## 2020-08-17 ENCOUNTER — Telehealth: Payer: Self-pay | Admitting: Primary Care

## 2020-08-17 NOTE — Telephone Encounter (Signed)
Transition Care Management Unsuccessful Follow-up Telephone Call  Date of discharge and from where:  08/16/2020-[facility]   Attempts:  1st Attempt  Reason for unsuccessful TCM follow-up call:  Left voice message

## 2020-08-17 NOTE — Telephone Encounter (Signed)
I have called and LM on VM for the pt to call us back. Need to find out what other DME she would like to use.

## 2020-08-18 NOTE — Telephone Encounter (Signed)
Transition Care Management Unsuccessful Follow-up Telephone Call  Date of discharge and from where:  08/16/2020-[facility]  Attempts:  2nd Attempt  Reason for unsuccessful TCM follow-up call:  Left voice message

## 2020-08-19 NOTE — Telephone Encounter (Signed)
Transition Care Management Unsuccessful Follow-up Telephone Call  Date of discharge and from where:  08/16/2020-[facility]  Attempts:  3rd Attempt  Reason for unsuccessful TCM follow-up call:  Left voice message

## 2020-08-20 DIAGNOSIS — G4733 Obstructive sleep apnea (adult) (pediatric): Secondary | ICD-10-CM | POA: Diagnosis not present

## 2020-08-27 NOTE — Telephone Encounter (Signed)
Called patient but she did not answer. I could not leave a VM due to her inbox being full. Will attempt again later.

## 2020-09-01 ENCOUNTER — Telehealth: Payer: BC Managed Care – PPO | Admitting: Nurse Practitioner

## 2020-09-01 DIAGNOSIS — N760 Acute vaginitis: Secondary | ICD-10-CM

## 2020-09-01 DIAGNOSIS — B9689 Other specified bacterial agents as the cause of diseases classified elsewhere: Secondary | ICD-10-CM

## 2020-09-01 MED ORDER — METRONIDAZOLE 500 MG PO TABS: 500.0000 mg | ORAL_TABLET | Freq: Two times a day (BID) | ORAL | 0 refills | Status: DC

## 2020-09-01 NOTE — Progress Notes (Signed)
E-Visit for Vaginal Symptoms ? ?We are sorry that you are not feeling well. Here is how we plan to help! ?Based on what you shared with me it looks like you: May have a vaginosis due to bacteria ? ?Vaginosis is an inflammation of the vagina that can result in discharge, itching and pain. The cause is usually a change in the normal balance of vaginal bacteria or an infection. Vaginosis can also result from reduced estrogen levels after menopause. ? ?The most common causes of vaginosis are: ? ? Bacterial vaginosis which results from an overgrowth of one on several organisms that are normally present in your vagina. ? ? Yeast infections which are caused by a naturally occurring fungus called candida. ? ? Vaginal atrophy (atrophic vaginosis) which results from the thinning of the vagina from reduced estrogen levels after menopause. ? ? Trichomoniasis which is caused by a parasite and is commonly transmitted by sexual intercourse. ? ?Factors that increase your risk of developing vaginosis include: ?Medications, such as antibiotics and steroids ?Uncontrolled diabetes ?Use of hygiene products such as bubble bath, vaginal spray or vaginal deodorant ?Douching ?Wearing damp or tight-fitting clothing ?Using an intrauterine device (IUD) for birth control ?Hormonal changes, such as those associated with pregnancy, birth control pills or menopause ?Sexual activity ?Having a sexually transmitted infection ? ?Your treatment plan is Metronidazole or Flagyl 500mg twice a day for 7 days.  I have electronically sent this prescription into the pharmacy that you have chosen. ? ?Be sure to take all of the medication as directed. Stop taking any medication if you develop a rash, tongue swelling or shortness of breath. Mothers who are breast feeding should consider pumping and discarding their breast milk while on these antibiotics. However, there is no consensus that infant exposure at these doses would be harmful.  ?Remember that  medication creams can weaken latex condoms. ?. ? ? ?HOME CARE: ? ?Good hygiene may prevent some types of vaginosis from recurring and may relieve some symptoms: ? ?Avoid baths, hot tubs and whirlpool spas. Rinse soap from your outer genital area after a shower, and dry the area well to prevent irritation. Don't use scented or harsh soaps, such as those with deodorant or antibacterial action. ?Avoid irritants. These include scented tampons and pads. ?Wipe from front to back after using the toilet. Doing so avoids spreading fecal bacteria to your vagina. ? ?Other things that may help prevent vaginosis include: ? ?Don't douche. Your vagina doesn't require cleansing other than normal bathing. Repetitive douching disrupts the normal organisms that reside in the vagina and can actually increase your risk of vaginal infection. Douching won't clear up a vaginal infection. ?Use a latex condom. Both female and female latex condoms may help you avoid infections spread by sexual contact. ?Wear cotton underwear. Also wear pantyhose with a cotton crotch. If you feel comfortable without it, skip wearing underwear to bed. Yeast thrives in moist environments ?Your symptoms should improve in the next day or two. ? ?GET HELP RIGHT AWAY IF: ? ?You have pain in your lower abdomen ( pelvic area or over your ovaries) ?You develop nausea or vomiting ?You develop a fever ?Your discharge changes or worsens ?You have persistent pain with intercourse ?You develop shortness of breath, a rapid pulse, or you faint. ? ?These symptoms could be signs of problems or infections that need to be evaluated by a medical provider now. ? ?MAKE SURE YOU  ? ?Understand these instructions. ?Will watch your condition. ?Will get help right   away if you are not doing well or get worse. ? ?Thank you for choosing an e-visit. ? ?Your e-visit answers were reviewed by a board certified advanced clinical practitioner to complete your personal care plan. Depending upon the  condition, your plan could have included both over the counter or prescription medications. ? ?Please review your pharmacy choice. Make sure the pharmacy is open so you can pick up prescription now. If there is a problem, you may contact your provider through MyChart messaging and have the prescription routed to another pharmacy.  Your safety is important to us. If you have drug allergies check your prescription carefully.  ? ?For the next 24 hours you can use MyChart to ask questions about today's visit, request a non-urgent call back, or ask for a work or school excuse. ?You will get an email in the next two days asking about your experience. I hope that your e-visit has been valuable and will speed your recovery. ? ?5-10 minutes spent reviewing and documenting in chart. ? ?

## 2020-09-15 ENCOUNTER — Other Ambulatory Visit: Payer: Self-pay

## 2020-09-15 ENCOUNTER — Ambulatory Visit (INDEPENDENT_AMBULATORY_CARE_PROVIDER_SITE_OTHER): Payer: BC Managed Care – PPO | Admitting: Family Medicine

## 2020-09-15 ENCOUNTER — Encounter (INDEPENDENT_AMBULATORY_CARE_PROVIDER_SITE_OTHER): Payer: Self-pay | Admitting: Family Medicine

## 2020-09-15 VITALS — BP 120/78 | HR 66 | Temp 98.0°F | Ht 62.0 in | Wt 244.0 lb

## 2020-09-15 DIAGNOSIS — Z6841 Body Mass Index (BMI) 40.0 and over, adult: Secondary | ICD-10-CM | POA: Diagnosis not present

## 2020-09-15 DIAGNOSIS — Z9189 Other specified personal risk factors, not elsewhere classified: Secondary | ICD-10-CM

## 2020-09-15 DIAGNOSIS — E559 Vitamin D deficiency, unspecified: Secondary | ICD-10-CM | POA: Diagnosis not present

## 2020-09-15 DIAGNOSIS — R0602 Shortness of breath: Secondary | ICD-10-CM | POA: Diagnosis not present

## 2020-09-15 DIAGNOSIS — Z1331 Encounter for screening for depression: Secondary | ICD-10-CM | POA: Diagnosis not present

## 2020-09-15 DIAGNOSIS — G4733 Obstructive sleep apnea (adult) (pediatric): Secondary | ICD-10-CM

## 2020-09-15 DIAGNOSIS — Z86718 Personal history of other venous thrombosis and embolism: Secondary | ICD-10-CM | POA: Diagnosis not present

## 2020-09-15 DIAGNOSIS — R5383 Other fatigue: Secondary | ICD-10-CM

## 2020-09-15 NOTE — Progress Notes (Signed)
Dear Debra Dura, NP,   Thank you for referring Debra Horn to our clinic. The following note includes my evaluation and treatment recommendations.  Chief Complaint:   OBESITY Debra Horn (MR# [id number]) is a 35 y.o. female who presents for evaluation and treatment of obesity and related comorbidities. Current BMI is Body mass index is 44.63 kg/m. Debra Horn has been struggling with her weight for many years and has been unsuccessful in either losing weight, maintaining weight loss, or reaching her healthy weight goal.  Debra Horn is currently in the action stage of change and ready to dedicate time achieving and maintaining a healthier weight. Debra Horn is interested in becoming our patient and working on intensive lifestyle modifications including (but not limited to) diet and exercise for weight loss.  Referred by pulmonology. 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 She is eating 1 meal per day. May have breakfast bowl but mostly snack; Lunch 3 PM- Almonds (handful) or single bags of chips (2-3 per day); Coffee with creamer; Dinner- spaghetti or 1.5 cups of hamburger helper or 2.5 oz fish in oven + 1 cup of rice + 1 cup of vegetable.  Debra Horn's habits were reviewed today and are as follows: Her family eats meals together, she thinks her family will eat healthier with her, her desired weight loss is 80 lbs, she started gaining weight in 2013, her heaviest weight ever was 255 pounds, she has significant food cravings issues, she snacks frequently in the evenings, she skips meals frequently, she is frequently drinking liquids with calories, she frequently makes poor food choices, and she struggles with emotional eating.  Depression Screen Debra Horn's Food and Mood (modified PHQ-9) score was 24.  Depression screen PHQ 2/9 09/15/2020  Decreased Interest 3  Down, Depressed, Hopeless 3  PHQ - 2 Score 6  Altered sleeping 3  Tired, decreased energy 3  Change in  appetite 3  Feeling bad or failure about yourself  3  Trouble concentrating 3  Moving slowly or fidgety/restless 3  Suicidal thoughts 0  PHQ-9 Score 24  Difficult doing work/chores Somewhat difficult   Subjective:   1. Other fatigue Debra Horn admits to daytime somnolence and admits to waking up still tired. Patent has a history of symptoms of daytime fatigue, morning fatigue, and morning headache. Debra Horn generally gets 4 or 5 hours of sleep per night, and states that she has poor sleep quality. Snoring is present. Apneic episodes are present. Epworth Sleepiness Score is 10.  2. SOB (shortness of breath) Debra Horn notes increasing shortness of breath with exercising and seems to be worsening over time with weight gain. She notes getting out of breath sooner with activity than she used to. This has gotten worse recently. [name] denies shortness of breath at rest or orthopnea.  3. OSA (obstructive sleep apnea) Debra Horn has a CPAP. Results showed severe obstructive sleep apnea.  4. Vitamin D deficiency She is not on a Vit D supplement and reports fatigue.  5. History of blood clots Thought blood clots were due to oral contraception. On a blood thinner for 6 months. Debra Horn was on Lovenox throughout her pregnancy and post partum.  7. At risk for malnutrition Debra Horn is at increased risk for malnutrition due to inadequate food intake.  Assessment/Plan:   1. Other fatigue Debra Horn does feel that her weight is causing her energy to be lower than it should be. Fatigue may be related to obesity, depression or many other causes. Labs will  be ordered, and in the meanwhile, Debra Horn will focus on self care including making healthy food choices, increasing physical activity and focusing on stress reduction. Check labs today.  - Hemoglobin A1c - Insulin, random - Vitamin B12 - T3 - T4, free - TSH  2. SOB (shortness of breath) Debra Horn does feel that she gets out of breath more easily that she used to when  she exercises. Debra Horn's shortness of breath appears to be obesity related and exercise induced. She has agreed to work on weight loss and gradually increase exercise to treat her exercise induced shortness of breath. Will continue to monitor closely. Check labs today.  - Lipid Panel With LDL/HDL Ratio  3. OSA (obstructive sleep apnea) Intensive lifestyle modifications are the first line treatment for this issue. We discussed several lifestyle modifications today and she will continue to work on diet, exercise and weight loss efforts. We will continue to monitor. Orders and follow up as documented in patient record. Follow up with pulmonology.  4. Vitamin D deficiency Low Vitamin D level contributes to fatigue and are associated with obesity, breast, and colon cancer. She will follow-up for routine testing of Vitamin D, at least 2-3 times per year to avoid over-replacement. Check labs today.  - VITAMIN D 25 Hydroxy (Vit-D Deficiency, Fractures)  5. History of blood clots Follow up at subsequent appt.  6. Depression screening Debra Horn had a positive depression screening. Depression is commonly associated with obesity and often results in emotional eating behaviors. We will monitor this closely and work on CBT to help improve the non-hunger eating patterns. Referral to Psychology may be required if no improvement is seen as she continues in our clinic.  7. At risk for malnutrition Debra Horn was given approximately 15 minutes of counseling today regarding prevention of malnutrition and ways to meet macronutrient goals..   8. Obesity with current BMI of 44.7  Debra Horn is currently in the action stage of change and her goal is to continue with weight loss efforts. I recommend Debra Horn begin the structured treatment plan as follows:  She has agreed to the Category 2 Plan.  Pt is interested in weight loss medication.  Exercise goals: No exercise has been prescribed at this time.   Behavioral  modification strategies: increasing lean protein intake, increasing water intake, no skipping meals, and meal planning and cooking strategies.  She was informed of the importance of frequent follow-up visits to maximize her success with intensive lifestyle modifications for her multiple health conditions. She was informed we would discuss her lab results at her next visit unless there is a critical issue that needs to be addressed sooner. Maryellen agreed to keep her next visit at the agreed upon time to discuss these results.  Objective:   Blood pressure 120/78, pulse 66, temperature 98 F (36.7 C), height 5\' 2"  (1.575 m), weight 244 lb (110.7 kg), last menstrual period 08/06/2020, SpO2 100 %. Body mass index is 44.63 kg/m.  EKG: Abnormal- Sinus rhythm, rate 89.  Indirect Calorimeter completed today shows a VO2 of 235 and a REE of 1627.  Her calculated basal metabolic rate is 10/06/2020 thus her basal metabolic rate is worse than expected.  General: Cooperative, alert, well developed, in no acute distress. HEENT: Conjunctivae and lids unremarkable. Cardiovascular: Regular rhythm.  Lungs: Normal work of breathing. Neurologic: No focal deficits.   Lab Results  Component Value Date   CREATININE 0.60 08/15/2020   BUN 15 08/15/2020   NA 139 08/15/2020   K 3.8 08/15/2020  CL 104 08/15/2020   CO2 27 08/15/2020   Lab Results  Component Value Date   ALT 21 08/15/2020   AST 18 08/15/2020   ALKPHOS 104 08/15/2020   BILITOT 0.5 08/15/2020   Lab Results  Component Value Date   HGBA1C 5.0 02/09/2017   HGBA1C 5.6 02/19/2014   No results found for: INSULIN Lab Results  Component Value Date   TSH 1.56 02/19/2014   No results found for: CHOL, HDL, LDLCALC, LDLDIRECT, TRIG, CHOLHDL Lab Results  Component Value Date   WBC 9.2 08/15/2020   HGB 10.9 (L) 08/15/2020   HCT 36.6 08/15/2020   MCV 80.6 08/15/2020   PLT 586 (H) 08/15/2020   Attestation Statements:   Reviewed by clinician on day  of visit: allergies, medications, problem list, medical history, surgical history, family history, social history, and previous encounter notes.  Edmund Hilda, CMA, am acting as transcriptionist for Reuben Likes, MD.   This is the patient's first visit at [facility]. The patient's NEW PATIENT PACKET was reviewed at length. Included in the packet: current and past health history, medications, allergies, ROS, gynecologic history (women only), surgical history, family history, social history, weight history, weight loss surgery history (for those that have had weight loss surgery), nutritional evaluation, mood and food questionnaire, PHQ9, Epworth questionnaire, sleep habits questionnaire, patient life and health improvement goals questionnaire. These will all be scanned into the patient's chart under media.   During the visit, I independently reviewed the patient's EKG, bioimpedance scale results, and indirect calorimeter results. I used this information to tailor a meal plan for the patient that will help her to lose weight and will improve her obesity-related conditions going forward. I performed a medically necessary appropriate examination and/or evaluation. I discussed the assessment and treatment plan with the patient. The patient was provided an opportunity to ask questions and all were answered. The patient agreed with the plan and demonstrated an understanding of the instructions. Labs were ordered at this visit and will be reviewed at the next visit unless more critical results need to be addressed immediately. Clinical information was updated and documented in the EMR.   Time spent on visit including pre-visit chart review and post-visit care was 45 minutes.   A separate 15 minutes was spent on risk counseling (see above).  I have reviewed the above documentation for accuracy and completeness, and I agree with the above. - Reuben Likes, MD

## 2020-09-16 DIAGNOSIS — R0602 Shortness of breath: Secondary | ICD-10-CM | POA: Diagnosis not present

## 2020-09-16 DIAGNOSIS — E559 Vitamin D deficiency, unspecified: Secondary | ICD-10-CM | POA: Diagnosis not present

## 2020-09-16 DIAGNOSIS — R5383 Other fatigue: Secondary | ICD-10-CM | POA: Diagnosis not present

## 2020-09-17 LAB — LIPID PANEL WITH LDL/HDL RATIO
Cholesterol, Total: 155 mg/dL (ref 100–199)
HDL: 46 mg/dL (ref >39)
LDL Chol Calc (NIH): 97 mg/dL (ref 0–99)
LDL/HDL Ratio: 2.1 ratio (ref 0.0–3.2)
Triglycerides: 62 mg/dL (ref 0–149)
VLDL Cholesterol Cal: 12 mg/dL (ref 5–40)

## 2020-09-17 LAB — VITAMIN B12: Vitamin B-12: 662 pg/mL (ref 232–1245)

## 2020-09-17 LAB — HEMOGLOBIN A1C
Est. average glucose Bld gHb Est-mCnc: 108 mg/dL
Hgb A1c MFr Bld: 5.4 % (ref 4.8–5.6)

## 2020-09-17 LAB — VITAMIN D 25 HYDROXY (VIT D DEFICIENCY, FRACTURES): Vit D, 25-Hydroxy: 18.1 ng/mL — ABNORMAL LOW (ref 30.0–100.0)

## 2020-09-17 LAB — INSULIN, RANDOM: INSULIN: 16.1 u[IU]/mL (ref 2.6–24.9)

## 2020-09-17 LAB — TSH: TSH: 1.52 u[IU]/mL (ref 0.450–4.500)

## 2020-09-17 LAB — T3: T3, Total: 135 ng/dL (ref 71–180)

## 2020-09-17 LAB — T4, FREE: Free T4: 1.16 ng/dL (ref 0.82–1.77)

## 2020-09-20 DIAGNOSIS — G4733 Obstructive sleep apnea (adult) (pediatric): Secondary | ICD-10-CM | POA: Diagnosis not present

## 2020-09-28 ENCOUNTER — Encounter (HOSPITAL_COMMUNITY): Payer: Self-pay | Admitting: Emergency Medicine

## 2020-09-28 ENCOUNTER — Other Ambulatory Visit: Payer: Self-pay

## 2020-09-28 ENCOUNTER — Emergency Department (HOSPITAL_COMMUNITY)
Admission: EM | Admit: 2020-09-28 | Discharge: 2020-09-28 | Disposition: A | Discharge status: Home / Self Care | Payer: BC Managed Care – PPO | Attending: Emergency Medicine | Admitting: Emergency Medicine

## 2020-09-28 ENCOUNTER — Emergency Department (HOSPITAL_COMMUNITY): Payer: BC Managed Care – PPO

## 2020-09-28 DIAGNOSIS — Z7951 Long term (current) use of inhaled steroids: Secondary | ICD-10-CM | POA: Insufficient documentation

## 2020-09-28 DIAGNOSIS — J45909 Unspecified asthma, uncomplicated: Secondary | ICD-10-CM | POA: Diagnosis not present

## 2020-09-28 DIAGNOSIS — D649 Anemia, unspecified: Secondary | ICD-10-CM | POA: Insufficient documentation

## 2020-09-28 DIAGNOSIS — R0789 Other chest pain: Secondary | ICD-10-CM

## 2020-09-28 DIAGNOSIS — R0602 Shortness of breath: Secondary | ICD-10-CM | POA: Diagnosis not present

## 2020-09-28 LAB — BASIC METABOLIC PANEL
Anion gap: 8 (ref 5–15)
BUN: 11 mg/dL (ref 6–20)
CO2: 24 mmol/L (ref 22–32)
Calcium: 8.8 mg/dL — ABNORMAL LOW (ref 8.9–10.3)
Chloride: 108 mmol/L (ref 98–111)
Creatinine, Ser: 0.49 mg/dL (ref 0.44–1.00)
GFR, Estimated: >60 mL/min (ref >60)
Glucose, Bld: 86 mg/dL (ref 70–99)
Potassium: 3.5 mmol/L (ref 3.5–5.1)
Sodium: 140 mmol/L (ref 135–145)

## 2020-09-28 LAB — CBC
HCT: 31.6 % — ABNORMAL LOW (ref 36.0–46.0)
Hemoglobin: 9.3 g/dL — ABNORMAL LOW (ref 12.0–15.0)
MCH: 24.4 pg — ABNORMAL LOW (ref 26.0–34.0)
MCHC: 29.4 g/dL — ABNORMAL LOW (ref 30.0–36.0)
MCV: 82.9 fL (ref 80.0–100.0)
Platelets: 406 10*3/uL — ABNORMAL HIGH (ref 150–400)
RBC: 3.81 MIL/uL — ABNORMAL LOW (ref 3.87–5.11)
RDW: 18.5 % — ABNORMAL HIGH (ref 11.5–15.5)
WBC: 9.3 10*3/uL (ref 4.0–10.5)
nRBC: 0 % (ref 0.0–0.2)

## 2020-09-28 LAB — D-DIMER, QUANTITATIVE: D-Dimer, Quant: 0.5 ug/mL-FEU (ref 0.00–0.50)

## 2020-09-28 LAB — TROPONIN I (HIGH SENSITIVITY)
Troponin I (High Sensitivity): 2 ng/L (ref <18)
Troponin I (High Sensitivity): 3 ng/L (ref <18)

## 2020-09-28 MED ORDER — HYDROMORPHONE HCL 1 MG/ML IJ SOLN
0.5000 mg | Freq: Once | INTRAMUSCULAR | Status: AC
Start: 2020-09-28 — End: 2020-09-28
  Administered 2020-09-28: 0.5 mg via INTRAVENOUS
  Filled 2020-09-28: qty 1

## 2020-09-28 MED ORDER — CYCLOBENZAPRINE HCL 10 MG PO TABS
10.0000 mg | ORAL_TABLET | Freq: Once | ORAL | Status: AC
  Administered 2020-09-28: 10 mg via ORAL
  Filled 2020-09-28: qty 1

## 2020-09-28 MED ORDER — LIDOCAINE VISCOUS HCL 2 % MT SOLN
15.0000 mL | Freq: Once | OROMUCOSAL | Status: AC
  Administered 2020-09-28: 15 mL via ORAL
  Filled 2020-09-28: qty 15

## 2020-09-28 MED ORDER — ALUM & MAG HYDROXIDE-SIMETH 200-200-20 MG/5ML PO SUSP
30.0000 mL | Freq: Once | ORAL | Status: AC
  Administered 2020-09-28: 30 mL via ORAL
  Filled 2020-09-28: qty 30

## 2020-09-28 MED ORDER — ASPIRIN 81 MG PO CHEW
324.0000 mg | CHEWABLE_TABLET | Freq: Once | ORAL | Status: AC
  Administered 2020-09-28: 324 mg via ORAL
  Filled 2020-09-28: qty 4

## 2020-09-28 NOTE — Discharge Instructions (Addendum)
Lab work and imaging are reassuring.  Continue with over-the-counter pain medications as needed.  I have sent out an amatory referral to cardiology they should be contacting you within the next week or so.  Please follow-up with your PCP for further evaluation.  Come back to the emergency department if you develop chest pain, shortness of breath, severe abdominal pain, uncontrolled nausea, vomiting, diarrhea.

## 2020-09-28 NOTE — ED Provider Notes (Signed)
Emergency Medicine Provider Triage Evaluation Note  Debra Horn , a 35 y.o. female  was evaluated in triage.  Pt complains of chest pain that started intermittent a few weeks ago but 3 days ago sxs worsened.  Review of Systems  Positive: Chest pain, sob Negative: fevers  Physical Exam  BP (!) 148/107   Pulse 81   Temp 98.1 F (36.7 C) (Oral)   Resp 18   SpO2 100%  Gen:   Awake, no distress   Resp:  Normal effort  MSK:   Moves extremities without difficulty  Other:  Heart with rrr  Medical Decision Making  Medically screening exam initiated at 6:18 PM.  Appropriate orders placed.  Debra Horn was informed that the remainder of the evaluation will be completed by another provider, this initial triage assessment does not replace that evaluation, and the importance of remaining in the ED until their evaluation is complete.     Rayne Du 09/28/20 1820    Gwyneth Sprout, MD 09/29/20 [phone]

## 2020-09-28 NOTE — ED Provider Notes (Signed)
[facility]-EMERGENCY DEPT Provider Note   CSN: [id number] Arrival date & time: 09/28/20  1737     History No chief complaint on file.   Debra Horn is a 35 y.o. female.  HPI  Patient with significant medical history of previous PE, presents to the emergency department with chief complaint of right-sided chest pain.  Patient states chest pain started approximately 3 days ago, states the pain is constant,  pain  will radiate into her shoulders bilaterally and feel it down her flank, she will have occasional shortness of breath, and a tight like sensation in her chest she denies becoming diaphoretic, nausea, vomiting, lightheaded or dizziness.  She has no significant cardiac history, denies history of drug use, she has had a PE in the past states it was in 2013 while she was on birth control she is currently not on it, denies  leg pain, recent travels, recent surgeries.  Patient has no other complaints.  Patient states she has been taking Tylenol without much relief, states the pain is worse when she lays down, improves when she sits up, will increase more with arm movements.  States that she  never had pain like this past.  Denies alleviating factors.  Does not endorse fevers, chills, abdominal pain, worsening pedal edema.  Past Medical History:  Diagnosis Date   Anemia    low iron   Asthma    Complication of anesthesia    bp dropped in middle of last c-section and started with n/v   GERD (gastroesophageal reflux disease)    during pregnancy   Headache    "couple days/week" (10/28/2015)   Hyperthyroidism    not on medication   PE (pulmonary embolism) 11/2011   PONV (postoperative nausea and vomiting)    Pregnancy induced hypertension    Preterm labor    Sleep apnea    SOB (shortness of breath)    Thyroid condition    Vitamin D deficiency     Patient Active Problem List   Diagnosis Date Noted   Mild persistent asthma 06/24/2020   Severe sleep apnea  06/24/2020   BMI 45.0-49.9, adult (HCC) 06/24/2020   Status post repeat low transverse cesarean section 07/27/2017   Poor fetal growth affecting management of mother in third trimester    Previous pregnancy complicated by pregnancy-induced hypertension in third trimester, antepartum    Unwanted fertility 06/06/2017   IUGR (intrauterine growth restriction) in prior pregnancy, pregnant 03/06/2017   Vitamin D deficiency 02/17/2017   History of preterm delivery 02/09/2017   History of asthma    Normocytic anemia 02/06/2016   Family history of systemic lupus erythematosus (SLE) in mother 02/19/2014   Hyperthyroidism 02/19/2014   Previous cesarean section 08/08/2012   Hx pulmonary embolism 07/24/2012    Past Surgical History:  Procedure Laterality Date   CESAREAN SECTION  2008; 2014   CESAREAN SECTION N/A 07/27/2017   Procedure: REPEAT CESAREAN SECTION;  Surgeon: Reva Bores, MD;  Location: [facility] BIRTHING SUITES;  Service: Obstetrics;  Laterality: N/A;   FRACTURE SURGERY     ORIF ANKLE FRACTURE Right 10/28/2015   ORIF ANKLE FRACTURE Right 10/28/2015   Procedure: OPEN REDUCTION INTERNAL FIXATION (ORIF) RIGHT ANKLE FRACTURE;  Surgeon: Yolonda Kida, MD;  Location: [facility] OR;  Service: Orthopedics;  Laterality: Right;   TUBAL LIGATION Bilateral 07/27/2017   Procedure: BILATERAL TUBAL LIGATION;  Surgeon: Reva Bores, MD;  Location: [facility] BIRTHING SUITES;  Service: Obstetrics;  Laterality: Bilateral;  OB History     Gravida  3   Para  3   Term  1   Preterm  2   AB      Living  3      SAB      IAB      Ectopic      Multiple  0   Live Births  3           Family History  Problem Relation Age of Onset   Hypertension Maternal Grandfather    Hypertension Paternal Grandmother    Cancer Paternal Grandmother    Hyperlipidemia Paternal Grandmother    Diabetes Paternal Grandmother    Hypertension Father    Diabetes Father    Cancer Sister        skin   Cancer  Maternal Grandmother        leukemia   Rheum arthritis Mother    Lupus Mother    Hypothyroidism Maternal Aunt    Lupus Other     Social History   Tobacco Use   Smoking status: Never   Smokeless tobacco: Never  Vaping Use   Vaping Use: Never used  Substance Use Topics   Alcohol use: No   Drug use: No    Home Medications Prior to Admission medications   Medication Sig Start Date End Date Taking? Authorizing Provider  acetaminophen (TYLENOL) 650 MG CR tablet Take 650 mg by mouth every 8 (eight) hours as needed for pain.   Yes [provider]  albuterol (VENTOLIN HFA) 108 (90 Base) MCG/ACT inhaler Inhale 2 puffs into the lungs every 6 (six) hours as needed for wheezing or shortness of breath. 06/25/20  Yes Glenford Bayley, NP  fluticasone (FLOVENT HFA) 110 MCG/ACT inhaler Inhale 2 puffs into the lungs in the morning and at bedtime. 06/24/20  Yes Glenford Bayley, NP  albuterol (PROVENTIL) (2.5 MG/3ML) 0.083% nebulizer solution Take 3 mLs (2.5 mg total) by nebulization every 6 (six) hours as needed for wheezing or shortness of breath. 06/18/20   [name], FNP  metroNIDAZOLE (FLAGYL) 500 MG tablet Take 1 tablet (500 mg total) by mouth 2 (two) times daily. Patient not taking: Reported on 09/28/2020 09/01/20   Bennie Pierini, FNP  montelukast (SINGULAIR) 10 MG tablet Take 1 tablet (10 mg total) by mouth at bedtime. Patient not taking: No sig reported 06/25/20   Glenford Bayley, NP  dicyclomine (BENTYL) 20 MG tablet Take 1 tablet (20 mg total) by mouth 2 (two) times daily. Patient not taking: No sig reported 12/18/18 05/08/19  Linward Headland V, PA-C  enoxaparin (LOVENOX) 30 MG/0.3ML injection Inject 0.4 mLs (40 mg total) into the skin every 12 (twelve) hours. 07/31/17 07/19/18  Marylene Land, CNM  ferrous sulfate (FERROUSUL) 325 (65 FE) MG tablet Take 1 tablet (325 mg total) by mouth daily with breakfast. Patient not taking: No sig reported 02/21/17 07/19/18   Leftwich-Kirby, Wilmer Floor, CNM  furosemide (LASIX) 40 MG tablet Take 1 tablet (40 mg total) by mouth daily. Patient not taking: No sig reported 08/07/17 07/19/18  Marylene Land, CNM  glycopyrrolate (ROBINUL) 1 MG tablet Take 1 tablet (1 mg total) by mouth 3 (three) times daily. Patient not taking: No sig reported 02/09/17 07/19/18  Orvilla Cornwall A, CNM  ipratropium-albuterol (DUONEB) 0.5-2.5 (3) MG/3ML SOLN Take 3 mLs by nebulization every 6 (six) hours as needed. 06/28/19 09/15/19  Belinda Fisher, PA-C    Allergies    Banana and Cefixime  Review of Systems   Review of Systems  Constitutional:  Negative for chills and fever.  HENT:  Negative for congestion.   Respiratory:  Positive for shortness of breath.   Cardiovascular:  Positive for chest pain. Negative for palpitations and leg swelling.  Gastrointestinal:  Negative for abdominal pain, diarrhea, nausea and vomiting.  Genitourinary:  Negative for enuresis.  Musculoskeletal:  Negative for back pain.  Skin:  Negative for rash.  Neurological:  Negative for dizziness.  Hematological:  Does not bruise/bleed easily.   Physical Exam Updated Vital Signs BP (!) 136/91   Pulse 62   Temp 98.1 F (36.7 C) (Oral)   Resp 16   SpO2 94%   Physical Exam Vitals and nursing note reviewed.  Constitutional:      General: She is not in acute distress.    Appearance: She is not ill-appearing.  HENT:     Head: Normocephalic and atraumatic.     Nose: No congestion.  Eyes:     Conjunctiva/sclera: Conjunctivae normal.  Cardiovascular:     Rate and Rhythm: Normal rate and regular rhythm.     Pulses: Normal pulses.     Heart sounds: No murmur heard.   No friction rub. No gallop.  Pulmonary:     Effort: No respiratory distress.     Breath sounds: No wheezing, rhonchi or rales.     Comments: Pain was reproducible with palpation. Abdominal:     Palpations: Abdomen is soft.     Tenderness: There is no abdominal tenderness. There is no right  CVA tenderness or left CVA tenderness.  Musculoskeletal:     Right lower leg: No edema.     Left lower leg: No edema.  Skin:    General: Skin is warm and dry.  Neurological:     Mental Status: She is alert.  Psychiatric:        Mood and Affect: Mood normal.    ED Results / Procedures / Treatments   Labs (all labs ordered are listed, but only abnormal results are displayed) Labs Reviewed  BASIC METABOLIC PANEL - Abnormal; Notable for the following components:      Result Value   Calcium 8.8 (*)    All other components within normal limits  CBC - Abnormal; Notable for the following components:   RBC 3.81 (*)    Hemoglobin 9.3 (*)    HCT 31.6 (*)    MCH 24.4 (*)    MCHC 29.4 (*)    RDW 18.5 (*)    Platelets 406 (*)    All other components within normal limits  D-DIMER, QUANTITATIVE  TROPONIN I (HIGH SENSITIVITY)  TROPONIN I (HIGH SENSITIVITY)    EKG EKG Interpretation  Date/Time:  Tuesday September 28 2020 20:37:22 EDT Ventricular Rate:  66 PR Interval:  132 QRS Duration: 96 QT Interval:  389 QTC Calculation: 408 R Axis:   85 Text Interpretation: Sinus rhythm Nonspecific T abnormalities, anterior leads Confirmed by Ernie Avena ([id number]) on 09/28/2020 10:51:43 PM  Radiology DG Chest Port 1 View  Result Date: 09/28/2020 CLINICAL DATA:  Right-sided chest pain and shortness of breath for 3 days, pain radiating to back EXAM: PORTABLE CHEST 1 VIEW COMPARISON:  02/06/2016 FINDINGS: The heart size and mediastinal contours are within normal limits. Both lungs are clear. The visualized skeletal structures are unremarkable. IMPRESSION: No active disease. Electronically Signed   By: Sharlet Salina M.D.   On: 09/28/2020 19:08    Procedures Procedures   Medications Ordered in ED  Medications  alum & mag hydroxide-simeth (MAALOX/MYLANTA) 200-200-20 MG/5ML suspension 30 mL (30 mLs Oral Given 09/28/20 1920)    And  lidocaine (XYLOCAINE) 2 % viscous mouth solution 15 mL (15 mLs Oral  Given 09/28/20 1920)  cyclobenzaprine (FLEXERIL) tablet 10 mg (10 mg Oral Given 09/28/20 1920)  aspirin chewable tablet 324 mg (324 mg Oral Given 09/28/20 2116)  HYDROmorphone (DILAUDID) injection 0.5 mg (0.5 mg Intravenous Given 09/28/20 2117)    ED Course  I have reviewed the triage vital signs and the nursing notes.  Pertinent labs & imaging results that were available during my care of the patient were reviewed by me and considered in my medical decision making (see chart for details).    MDM Rules/Calculators/A&P                          Initial impression-patient presents with chest pain x3 days.  She is alert, does not appear in acute stress, vital signs reassuring.  Unclear etiology, will obtain chest pain work-up, provide patient with Flexeril, GI cocktail and reassess.  Work-up-CBC shows normocytic anemia hemoglobin 9.3, appears at baseline, BMP shows calcium 8.8, D-dimer 0.5, for troponin is 2, sec troponin is 3.  Chest x-ray unremarkable.  EKG sinus without signs of ischemia.  Reassessment-nursing staff notified me that patient was upset about questions regarding drug use, I spoke with the patient about this explained to her that this was standard question to help rule out possible causes of chest pain, I explained to her that this was in no way accusatory and I did not intend for it to come out that way. Patient voiced understanding.  Patient reassessed after Flexeril and GI cocktail states she still having chest pain at this time. will provide Dilaudid.  Patient assessed after Dilaudid states her pain is resolved, has no complaints, updated patient on lab and imaging she is agreeable for discharge.  Rule out- I have low suspicion for ACS as history is atypical, patient has no cardiac history, EKG was sinus rhythm without signs of ischemia, patient had a delta troponin.  Low suspicion for PE as D-dimer is negative, no unilateral leg swelling, presentation atypical of etiology i.e.  nonhypoxic, nontachypneic, nontachycardic.  Low suspicion for AAA or aortic dissection as history is atypical, patient has low risk factors.  Low suspicion for systemic infection as patient is nontoxic-appearing, vital signs reassuring, no obvious source infection noted on exam.   Plan-  Chest pain improved-unclear etiology likely musculoskeletal, will have patient follow-up with PCP and or cardiology for further recommendations.  Vital signs have remained stable, no indication for hospital admission.  Patient given at home care as well strict return precautions.  Patient verbalized that they understood agreed to said plan.  Final Clinical Impression(s) / ED Diagnoses Final diagnoses:  Atypical chest pain    Rx / DC Orders ED Discharge Orders          Ordered    Ambulatory referral to Cardiology        09/28/20 2328             Carroll Sage, PA-C 09/29/20 0015    Ernie Avena, MD 09/29/20 [phone]

## 2020-09-28 NOTE — ED Triage Notes (Signed)
Pt c/o right sided chest pain and shob x 3 days that radiates to the back. Sx decrease with rest. Pt took tylenol with no relief.

## 2020-09-29 ENCOUNTER — Ambulatory Visit (INDEPENDENT_AMBULATORY_CARE_PROVIDER_SITE_OTHER): Payer: BC Managed Care – PPO | Admitting: Family Medicine

## 2020-09-29 ENCOUNTER — Telehealth: Payer: Self-pay

## 2020-09-29 ENCOUNTER — Encounter (INDEPENDENT_AMBULATORY_CARE_PROVIDER_SITE_OTHER): Payer: Self-pay | Admitting: Family Medicine

## 2020-09-29 VITALS — BP 124/79 | HR 79 | Temp 98.1°F | Ht 62.0 in | Wt 245.0 lb

## 2020-09-29 DIAGNOSIS — E8881 Metabolic syndrome: Secondary | ICD-10-CM

## 2020-09-29 DIAGNOSIS — Z9189 Other specified personal risk factors, not elsewhere classified: Secondary | ICD-10-CM | POA: Diagnosis not present

## 2020-09-29 DIAGNOSIS — E559 Vitamin D deficiency, unspecified: Secondary | ICD-10-CM | POA: Diagnosis not present

## 2020-09-29 DIAGNOSIS — Z6841 Body Mass Index (BMI) 40.0 and over, adult: Secondary | ICD-10-CM | POA: Diagnosis not present

## 2020-09-29 DIAGNOSIS — D649 Anemia, unspecified: Secondary | ICD-10-CM

## 2020-09-29 MED ORDER — VITAMIN D (ERGOCALCIFEROL) 1.25 MG (50000 UNIT) PO CAPS: 50000.0000 [IU] | ORAL_CAPSULE | ORAL | 0 refills | Status: DC

## 2020-09-29 NOTE — Telephone Encounter (Signed)
Transition Care Management Unsuccessful Follow-up Telephone Call  Date of discharge and from where:  09/28/2020-[place]   Attempts:  1st Attempt  Reason for unsuccessful TCM follow-up call:  Left voice message    

## 2020-09-29 NOTE — Progress Notes (Signed)
Chief Complaint:   OBESITY Debra Horn is here to discuss her progress with her obesity treatment plan along with follow-up of her obesity related diagnoses. Debra Horn is on the Category 2 Plan and states she is following her eating plan approximately 80% of the time. Debra Horn states she is walking 30 minutes 3 times per week.  Today's visit was #: 2 Starting weight: 244 lbs Starting date: 09/15/2020 Today's weight: 245 lbs Today's date: 09/29/2020 Total lbs lost to date: 0 Total lbs lost since last in-office visit: 0  Interim History: Debra Horn was seen in the ED last night for chest pain and was given pain meds, a GI cocktail, and a referral to cardiology. She is counting out almonds for snacks and has alarms set to remind her to eat. Pt is eating egg for breakfast and doing salad with 2 eggs at lunch, fruit and almonds for snack. Dinner is what everyone else is eating- chicken or fish and green beans or peas and instant potatoes. She is still drinking 12 oz soda 3-4 times a day. Pt has no plans for travel or excursions in the next few weeks.  Subjective:   1. Vitamin D deficiency New. Discussed labs with patient today. Debra Horn's last Vit D level was 18.1 and he is not on Vit D supplement.  2. Normocytic anemia Discussed labs with patient today. Debra Horn's Hgb/Hct are low at 9.3/31.6, MCV is 82.6, and RDW 18.5. pt says she is not consistently taking iron supplement.   3. Insulin resistance New. Discussed labs with patient today. Her A1c is 5.4 and insulin level 16.1. She has carb cravings for soda.  4. At risk of diabetes mellitus Debra Horn is at higher than average risk for developing diabetes due to obesity.   Assessment/Plan:   1. Vitamin D deficiency Low Vitamin D level contributes to fatigue and are associated with obesity, breast, and colon cancer. She agrees to continue to take prescription Vitamin D 50,000 IU every week and will follow-up for routine testing of Vitamin D, at least 2-3  times per year to avoid over-replacement.  Refill- Vitamin D, Ergocalciferol, (DRISDOL) 1.25 MG (50000 UNIT) CAPS capsule; Take 1 capsule (50,000 Units total) by mouth every 7 (seven) days.  Dispense: 4 capsule; Refill: 0  2. Normocytic anemia Outpatient pharmacy list given. Pt is encouraged to restart OTC iron supplementation daily.  3. Insulin resistance Debra Horn will continue to work on weight loss, exercise, and decreasing simple carbohydrates to help decrease the risk of diabetes. Debra Horn agreed to follow-up with [place] as directed to closely monitor her progress. Repeat labs in 3 months.  4. At risk of diabetes mellitus Debra Horn was given approximately 30 minutes of diabetes education and counseling today. We discussed intensive lifestyle modifications today with an emphasis on weight loss as well as increasing exercise and decreasing simple carbohydrates in her diet. We also reviewed medication options with an emphasis on risk versus benefit of those discussed.   Repetitive spaced learning was employed today to elicit superior memory formation and behavioral change.  5. Obesity with current BMI of 44.9  Debra Horn is currently in the action stage of change. As such, her goal is to continue with weight loss efforts. She has agreed to the Category 2 Plan. Start limiting soda.  Exercise goals:  As is- Pt wants to join [employer].  Behavioral modification strategies: increasing lean protein intake, no skipping meals, and meal planning and cooking strategies.  Debra Horn has agreed to follow-up with our clinic in 2  weeks. She was informed of the importance of frequent follow-up visits to maximize her success with intensive lifestyle modifications for her multiple health conditions.   Objective:   Blood pressure 124/79, pulse 79, temperature 98.1 F (36.7 C), height 5\' 2"  (1.575 m), weight 245 lb (111.1 kg), last menstrual period 08/06/2020, SpO2 100 %. Body mass index is 44.81 kg/m.  General: Cooperative,  alert, well developed, in no acute distress. HEENT: Conjunctivae and lids unremarkable. Cardiovascular: Regular rhythm.  Lungs: Normal work of breathing. Neurologic: No focal deficits.   Lab Results  Component Value Date   CREATININE 0.49 09/28/2020   BUN 11 09/28/2020   NA 140 09/28/2020   K 3.5 09/28/2020   CL 108 09/28/2020   CO2 24 09/28/2020   Lab Results  Component Value Date   ALT 21 08/15/2020   AST 18 08/15/2020   ALKPHOS 104 08/15/2020   BILITOT 0.5 08/15/2020   Lab Results  Component Value Date   HGBA1C 5.4 09/16/2020   HGBA1C 5.0 02/09/2017   HGBA1C 5.6 02/19/2014   Lab Results  Component Value Date   INSULIN 16.1 09/16/2020   Lab Results  Component Value Date   TSH 1.520 09/16/2020   Lab Results  Component Value Date   CHOL 155 09/16/2020   HDL 46 09/16/2020   LDLCALC 97 09/16/2020   TRIG 62 09/16/2020   Lab Results  Component Value Date   VD25OH 18.1 (L) 09/16/2020   VD25OH 8.4 (L) 02/09/2017   Lab Results  Component Value Date   WBC 9.3 09/28/2020   HGB 9.3 (L) 09/28/2020   HCT 31.6 (L) 09/28/2020   MCV 82.9 09/28/2020   PLT 406 (H) 09/28/2020    Attestation Statements:   Reviewed by clinician on day of visit: allergies, medications, problem list, medical history, surgical history, family history, social history, and previous encounter notes.  09/30/2020, CMA, am acting as transcriptionist for Edmund Hilda, MD.   I have reviewed the above documentation for accuracy and completeness, and I agree with the above. - Reuben Likes, MD

## 2020-10-04 NOTE — Telephone Encounter (Signed)
Transition Care Management Unsuccessful Follow-up Telephone Call  Date of discharge and from where:  09/28/2020 from [facility] ED  Attempts:  2nd Attempt  Reason for unsuccessful TCM follow-up call:  Left voice message

## 2020-10-05 NOTE — Telephone Encounter (Signed)
Transition Care Management Unsuccessful Follow-up Telephone Call  Date of discharge and from where:  09/28/2020 from [facility] ED  Attempts:  3rd Attempt  Reason for unsuccessful TCM follow-up call:  Unable to reach patient

## 2020-10-13 ENCOUNTER — Encounter (INDEPENDENT_AMBULATORY_CARE_PROVIDER_SITE_OTHER): Payer: Self-pay | Admitting: Adult Health

## 2020-10-13 ENCOUNTER — Other Ambulatory Visit: Payer: Self-pay

## 2020-10-13 ENCOUNTER — Ambulatory Visit (INDEPENDENT_AMBULATORY_CARE_PROVIDER_SITE_OTHER): Payer: BC Managed Care – PPO | Admitting: Adult Health

## 2020-10-13 VITALS — BP 132/76 | HR 76 | Temp 98.2°F | Ht 62.0 in | Wt 249.0 lb

## 2020-10-13 DIAGNOSIS — R0789 Other chest pain: Secondary | ICD-10-CM | POA: Insufficient documentation

## 2020-10-13 DIAGNOSIS — Z6841 Body Mass Index (BMI) 40.0 and over, adult: Secondary | ICD-10-CM | POA: Diagnosis not present

## 2020-10-13 DIAGNOSIS — E559 Vitamin D deficiency, unspecified: Secondary | ICD-10-CM | POA: Diagnosis not present

## 2020-10-13 MED ORDER — VITAMIN D (ERGOCALCIFEROL) 1.25 MG (50000 UNIT) PO CAPS: 50000.0000 [IU] | ORAL_CAPSULE | ORAL | 0 refills | Status: DC

## 2020-10-13 NOTE — Progress Notes (Signed)
Chief Complaint:   OBESITY Debra Horn is here to discuss her progress with her obesity treatment plan along with follow-up of her obesity related diagnoses. Debra Horn is on the Category 2 Plan and states she is following her eating plan approximately 95% of the time. Debra Horn states she is walking for 30 minutes 3 times per week.  Today's visit was #: 3 Starting weight: 244 lbs Starting date: 09/15/2020 Today's weight: 249 lbs Today's date: 10/13/2020 Total lbs lost to date: 0 Total lbs lost since last in-office visit: 0  Interim History: Debra Horn has an ED visit for chest pain on 09/28/2020 - reviewed all notes/imaging/labs.   Referral for Cardiology placed - she did not receive a call from specialist. Per Epic review- she was contacted three times without success.  Since last office visit, she has completely eliminated all soda.  The only fluid consumed is water.  Bioimpedance - reviewed with patient - up 1 pound muscle mass, up 2 pounds adipose, up 1 pound water.  Subjective:   1. Vitamin D deficiency Vitamin D level on 09/16/2020 - 18.1 - well below goal of 50. She is currently taking prescription ergocalciferol 50,000 IU each week. She denies nausea, vomiting or muscle weakness.  Lab Results  Component Value Date   VD25OH 18.1 (L) 09/16/2020   VD25OH 8.4 (L) 02/09/2017   2. Other chest pain Paxton has an ED visit for chest pain on 09/28/2020 - reviewed all notes/imaging/labs.   Referral for Cardiology placed - she did not receive a call from specialist. Per Epic review- she was contacted three times without success.  Assessment/Plan:   1. Vitamin D deficiency Refill ergocalciferol 50,000 IU once weekly, dispense 4 capsules, 0 refills.  2. Other chest pain If red flag sx's develop- seek immediate medal assistance.  Referral to Cardiology placed today.  - Ambulatory referral to Cardiology  3. Obesity with current BMI of 45.6  Debra Horn is currently in the action stage of  change. As such, her goal is to continue with weight loss efforts. She has agreed to the Category 2 Plan.   Exercise goals:  As is.  Behavioral modification strategies: increasing lean protein intake, decreasing simple carbohydrates, meal planning and cooking strategies, keeping healthy foods in the home, and planning for success.  Debra Horn has agreed to follow-up with our clinic in 2 weeks. She was informed of the importance of frequent follow-up visits to maximize her success with intensive lifestyle modifications for her multiple health conditions.   Objective:   Blood pressure 132/76, pulse 76, temperature 98.2 F (36.8 C), height 5\' 2"  (1.575 m), weight 249 lb (112.9 kg), SpO2 100 %. Body mass index is 45.54 kg/m.  General: Cooperative, alert, well developed, in no acute distress. HEENT: Conjunctivae and lids unremarkable. Cardiovascular: Regular rhythm.  Lungs: Normal work of breathing. Neurologic: No focal deficits.   Lab Results  Component Value Date   CREATININE 0.49 09/28/2020   BUN 11 09/28/2020   NA 140 09/28/2020   K 3.5 09/28/2020   CL 108 09/28/2020   CO2 24 09/28/2020   Lab Results  Component Value Date   ALT 21 08/15/2020   AST 18 08/15/2020   ALKPHOS 104 08/15/2020   BILITOT 0.5 08/15/2020   Lab Results  Component Value Date   HGBA1C 5.4 09/16/2020   HGBA1C 5.0 02/09/2017   HGBA1C 5.6 02/19/2014   Lab Results  Component Value Date   INSULIN 16.1 09/16/2020   Lab Results  Component Value Date  TSH 1.520 09/16/2020   Lab Results  Component Value Date   CHOL 155 09/16/2020   HDL 46 09/16/2020   LDLCALC 97 09/16/2020   TRIG 62 09/16/2020   Lab Results  Component Value Date   VD25OH 18.1 (L) 09/16/2020   VD25OH 8.4 (L) 02/09/2017   Lab Results  Component Value Date   WBC 9.3 09/28/2020   HGB 9.3 (L) 09/28/2020   HCT 31.6 (L) 09/28/2020   MCV 82.9 09/28/2020   PLT 406 (H) 09/28/2020   Attestation Statements:   Reviewed by clinician  on day of visit: allergies, medications, problem list, medical history, surgical history, family history, social history, and previous encounter notes.  Time spent on visit including pre-visit chart review and post-visit care and charting was 30 minutes.   I, Insurance claims handler, CMA, am acting as Energy manager for William Hamburger, NP.  I have reviewed the above documentation for accuracy and completeness, and I agree with the above. -  [place] Perry d. Tyrelle Raczka, NP-C

## 2020-10-18 NOTE — Progress Notes (Signed)
Erroneous encounter

## 2020-10-21 ENCOUNTER — Encounter: Payer: BC Managed Care – PPO | Admitting: Family

## 2020-10-21 DIAGNOSIS — G4733 Obstructive sleep apnea (adult) (pediatric): Secondary | ICD-10-CM | POA: Diagnosis not present

## 2020-10-21 DIAGNOSIS — Z7689 Persons encountering health services in other specified circumstances: Secondary | ICD-10-CM

## 2020-10-26 ENCOUNTER — Other Ambulatory Visit: Payer: Self-pay

## 2020-10-26 ENCOUNTER — Encounter (INDEPENDENT_AMBULATORY_CARE_PROVIDER_SITE_OTHER): Payer: Self-pay | Admitting: Adult Health

## 2020-10-26 ENCOUNTER — Ambulatory Visit (INDEPENDENT_AMBULATORY_CARE_PROVIDER_SITE_OTHER): Payer: BC Managed Care – PPO | Admitting: Adult Health

## 2020-10-26 VITALS — BP 118/74 | HR 76 | Temp 98.0°F | Ht 62.0 in | Wt 249.0 lb

## 2020-10-26 DIAGNOSIS — E559 Vitamin D deficiency, unspecified: Secondary | ICD-10-CM | POA: Diagnosis not present

## 2020-10-26 DIAGNOSIS — Z6841 Body Mass Index (BMI) 40.0 and over, adult: Secondary | ICD-10-CM | POA: Diagnosis not present

## 2020-10-26 DIAGNOSIS — E8881 Metabolic syndrome: Secondary | ICD-10-CM | POA: Insufficient documentation

## 2020-10-26 MED ORDER — VITAMIN D (ERGOCALCIFEROL) 1.25 MG (50000 UNIT) PO CAPS: 50000.0000 [IU] | ORAL_CAPSULE | ORAL | 0 refills | Status: DC

## 2020-10-26 NOTE — Progress Notes (Signed)
Chief Complaint:   OBESITY Debra Horn is here to discuss her progress with her obesity treatment plan along with follow-up of her obesity related diagnoses. Debra Horn is on the Category 2 Plan and states she is following her eating plan approximately 80% of the time. Debra Horn states she is not exercising regularly at this time.  Today's visit was #: 4 Starting weight: 244 lbs Starting date: 09/15/2020 Today's weight: 249 lbs Today's date: 10/26/2020 Total lbs lost to date: 0 Total lbs lost since last in-office visit: 0  Interim History: Debra Horn reports decreased appetite the last 2 weeks.  She denies fever/nausea/vomiting/diarrhea/hematochezia, or hematuria.   Breakfast - eggs, [place] bacon, 45 calorie bread. Lunch - [place] [employer] - Ham/turkey with [employer].  She is not tracking mayo calories. Dinner - 6 ounces or protein (chicken or fish) with salad. She is not tracking salad dressing calories.  Subjective:   1. Vitamin D deficiency On 09/16/2020, vitamin D level - 18.1 - well below goal of 50. She is currently taking prescription ergocalciferol 50,000 IU each week. She denies nausea, vomiting or muscle weakness.  2. Insulin resistance On 09/16/2020, insulin level - 16.1 - elevated. She reports decreased appetite the last 2 weeks. Maternal aunt had thyroid cancer - unsure of type.   She denies personal history of pancreatitis.   She denies 1st degree family history of T2D.  Assessment/Plan:   1. Vitamin D deficiency Refill ergocalciferol 50,000 IU once weekly, as per below.  - Refill Vitamin D, Ergocalciferol, (DRISDOL) 1.25 MG (50000 UNIT) CAPS capsule; Take 1 capsule (50,000 Units total) by mouth every 7 (seven) days.  Dispense: 4 capsule; Refill: 0  2. Insulin resistance Continue to increase protein intake and decrease carbohydrates/sugar intake.  3. Obesity with current BMI of 45.7  Debra Horn is currently in the action stage of change. As such, her goal is to continue  with weight loss efforts. She has agreed to the Category 2 Plan and keeping a food journal and adhering to recommended goals of 300-400 calories and 30 grams of protein at lunch.   1) Track all snack calories.  Strive to keep snack cal <200/day.  2) Start OTC multivitamin.  Exercise goals:  As is.    Behavioral modification strategies: increasing lean protein intake, decreasing simple carbohydrates, meal planning and cooking strategies, keeping healthy foods in the home, and planning for success.  Debra Horn has agreed to follow-up with our clinic in 2 weeks. She was informed of the importance of frequent follow-up visits to maximize her success with intensive lifestyle modifications for her multiple health conditions.   Objective:   Blood pressure 118/74, pulse 76, temperature 98 F (36.7 C), height 5\' 2"  (1.575 m), weight 249 lb (112.9 kg), SpO2 99 %. Body mass index is 45.54 kg/m.  General: Cooperative, alert, well developed, in no acute distress. HEENT: Conjunctivae and lids unremarkable. Cardiovascular: Regular rhythm.  Lungs: Normal work of breathing. Neurologic: No focal deficits.   Lab Results  Component Value Date   CREATININE 0.49 09/28/2020   BUN 11 09/28/2020   NA 140 09/28/2020   K 3.5 09/28/2020   CL 108 09/28/2020   CO2 24 09/28/2020   Lab Results  Component Value Date   ALT 21 08/15/2020   AST 18 08/15/2020   ALKPHOS 104 08/15/2020   BILITOT 0.5 08/15/2020   Lab Results  Component Value Date   HGBA1C 5.4 09/16/2020   HGBA1C 5.0 02/09/2017   HGBA1C 5.6 02/19/2014   Lab  Results  Component Value Date   INSULIN 16.1 09/16/2020   Lab Results  Component Value Date   TSH 1.520 09/16/2020   Lab Results  Component Value Date   CHOL 155 09/16/2020   HDL 46 09/16/2020   LDLCALC 97 09/16/2020   TRIG 62 09/16/2020   Lab Results  Component Value Date   VD25OH 18.1 (L) 09/16/2020   VD25OH 8.4 (L) 02/09/2017   Lab Results  Component Value Date   WBC  9.3 09/28/2020   HGB 9.3 (L) 09/28/2020   HCT 31.6 (L) 09/28/2020   MCV 82.9 09/28/2020   PLT 406 (H) 09/28/2020   Attestation Statements:   Reviewed by clinician on day of visit: allergies, medications, problem list, medical history, surgical history, family history, social history, and previous encounter notes.  Time spent on visit including pre-visit chart review and post-visit care and charting was 28 minutes.   I, Insurance claims handler, CMA, am acting as Energy manager for William Hamburger, NP.  I have reviewed the above documentation for accuracy and completeness, and I agree with the above. -  Shraga Custard d. Saman Umstead, NP-C

## 2020-10-28 DIAGNOSIS — G4733 Obstructive sleep apnea (adult) (pediatric): Secondary | ICD-10-CM | POA: Diagnosis not present

## 2020-11-09 ENCOUNTER — Other Ambulatory Visit: Payer: Self-pay

## 2020-11-09 ENCOUNTER — Encounter (INDEPENDENT_AMBULATORY_CARE_PROVIDER_SITE_OTHER): Payer: Self-pay | Admitting: Adult Health

## 2020-11-09 ENCOUNTER — Ambulatory Visit (INDEPENDENT_AMBULATORY_CARE_PROVIDER_SITE_OTHER): Payer: BC Managed Care – PPO | Admitting: Adult Health

## 2020-11-09 VITALS — BP 127/80 | HR 69 | Temp 98.4°F | Ht 62.0 in | Wt 244.0 lb

## 2020-11-09 DIAGNOSIS — E8881 Metabolic syndrome: Secondary | ICD-10-CM

## 2020-11-09 DIAGNOSIS — J3489 Other specified disorders of nose and nasal sinuses: Secondary | ICD-10-CM | POA: Insufficient documentation

## 2020-11-09 DIAGNOSIS — Z6841 Body Mass Index (BMI) 40.0 and over, adult: Secondary | ICD-10-CM | POA: Diagnosis not present

## 2020-11-09 MED ORDER — FLUTICASONE PROPIONATE 50 MCG/ACT NA SUSP: 2.0000 | Freq: Every day | NASAL | 0 refills | Status: DC

## 2020-11-09 NOTE — Progress Notes (Signed)
Chief Complaint:   OBESITY Debra Horn is here to discuss her progress with her obesity treatment plan along with follow-up of her obesity related diagnoses. Debra Horn is on the Category 2 Plan and states she is following her eating plan approximately 98% of the time. Debra Horn states she is not exercising regularly at this time.  Today's visit was #: 5 Starting weight: 244 lbs Starting date: 09/15/2020 Today's weight: 244 lbs Today's date: 11/09/2020 Total lbs lost to date: 0 Total lbs lost since last in-office visit: 5 lbs  Interim History:  Debra Horn says she enjoyed one cupcake when celebrating her sister's birthday.   She is quite pleased to have lost 5 pounds since last office visit. She started OTC multivitamin daily - great! She started OTC tumeric for reduction joint pain/inflammation.  Of note:  Goal weight - 80 pound weight loss by July - travel to [place] - which would be a weight 164 lbs/BMI 30.  Subjective:   1. Nasal drainage History of asthma and seasonal allergies. Experiencing clear nasal drainage >2 weeks. She denies fever or HA. She denies tobacco/vape use.  2. Insulin resistance On 09/16/2020, A1c 5.4 - normal.  Elevated insulin. She spoke with her family and her aunt did not have thyroid cancer - only hypothyroidism.  She again denies personal history of pancreatitis.  Assessment/Plan:   1. Nasal drainage Start Flonase 50 mcg/ACT - 2 sprays in both nostrils daily.  - Start fluticasone (FLONASE) 50 MCG/ACT nasal spray; Place 2 sprays into both nostrils daily.  Dispense: 16 g; Refill: 0  2. Insulin resistance Continue Category 2 meal plan.  Increase daily walking.  3. Obesity with current BMI of 44.8  Debra Horn is currently in the action stage of change. As such, her goal is to continue with weight loss efforts. She has agreed to the Category 2 Plan.   Exercise goals:  Increase daily walking.  Behavioral modification strategies: increasing lean protein  intake, decreasing simple carbohydrates, meal planning and cooking strategies, keeping healthy foods in the home, and planning for success.  Debra Horn has agreed to follow-up with our clinic in 3 weeks. She was informed of the importance of frequent follow-up visits to maximize her success with intensive lifestyle modifications for her multiple health conditions.   Objective:   Blood pressure 127/80, pulse 69, temperature 98.4 F (36.9 C), height 5\' 2"  (1.575 m), weight 244 lb (110.7 kg), SpO2 100 %. Body mass index is 44.63 kg/m.  General: Cooperative, alert, well developed, in no acute distress. HEENT: Conjunctivae and lids unremarkable. Cardiovascular: Regular rhythm.  Lungs: Normal work of breathing. Neurologic: No focal deficits.   Lab Results  Component Value Date   CREATININE 0.49 09/28/2020   BUN 11 09/28/2020   NA 140 09/28/2020   K 3.5 09/28/2020   CL 108 09/28/2020   CO2 24 09/28/2020   Lab Results  Component Value Date   ALT 21 08/15/2020   AST 18 08/15/2020   ALKPHOS 104 08/15/2020   BILITOT 0.5 08/15/2020   Lab Results  Component Value Date   HGBA1C 5.4 09/16/2020   HGBA1C 5.0 02/09/2017   HGBA1C 5.6 02/19/2014   Lab Results  Component Value Date   INSULIN 16.1 09/16/2020   Lab Results  Component Value Date   TSH 1.520 09/16/2020   Lab Results  Component Value Date   CHOL 155 09/16/2020   HDL 46 09/16/2020   LDLCALC 97 09/16/2020   TRIG 62 09/16/2020   Lab Results  Component  Value Date   VD25OH 18.1 (L) 09/16/2020   VD25OH 8.4 (L) 02/09/2017   Lab Results  Component Value Date   WBC 9.3 09/28/2020   HGB 9.3 (L) 09/28/2020   HCT 31.6 (L) 09/28/2020   MCV 82.9 09/28/2020   PLT 406 (H) 09/28/2020   Attestation Statements:   Reviewed by clinician on day of visit: allergies, medications, problem list, medical history, surgical history, family history, social history, and previous encounter notes.  Time spent on visit including pre-visit  chart review and post-visit care and charting was 30 minutes.   I, Insurance claims handler, CMA, am acting as Energy manager for William Hamburger, NP.  I have reviewed the above documentation for accuracy and completeness, and I agree with the above. -  Rashad Obeid d. Fotios Amos, NP-C

## 2020-11-17 ENCOUNTER — Ambulatory Visit (INDEPENDENT_AMBULATORY_CARE_PROVIDER_SITE_OTHER): Payer: BC Managed Care – PPO | Admitting: Cardiovascular Disease

## 2020-11-17 ENCOUNTER — Other Ambulatory Visit: Payer: Self-pay

## 2020-11-17 ENCOUNTER — Encounter: Payer: Self-pay | Admitting: Cardiovascular Disease

## 2020-11-17 VITALS — BP 138/78 | HR 79 | Ht 62.0 in | Wt 252.4 lb

## 2020-11-17 DIAGNOSIS — R072 Precordial pain: Secondary | ICD-10-CM | POA: Diagnosis not present

## 2020-11-17 MED ORDER — IBUPROFEN 800 MG PO TABS: 800.0000 mg | ORAL_TABLET | Freq: Three times a day (TID) | ORAL | 0 refills | Status: DC

## 2020-11-17 NOTE — Progress Notes (Signed)
Cardiology Office Note:   Date:  11/17/2020  NAME:  Debra Horn    MRN: [id number] DOB:  07/15/1985   PCP:  Pcp, No  Cardiologist:  None  Electrophysiologist:  None   Referring MD: Esaw Grandchild, NP   Chief Complaint  Patient presents with   Chest Pain    History of Present Illness:   Debra Horn is a 35 y.o. female with a hx of obesity who is being seen today for the evaluation of chest pain at the request of Danford, Berna Spare, NP. Seen in the ER 09/28/2020 for atypical CP. Attributed to MSK etiology. She reports she has had chest pain on and off for years.  Apparently symptoms worsened and she was seen in the emergency room in September.  She describes a constant dull pain in her left and right chest.  Apparently she had right chest pain when she was in the emergency room.  She reports it radiates into her back.  She also has this on the left side it can radiate into the back and neck.  She describes it as constant.  She reports no identifiable triggers.  She reports has been taking Tylenol but has not been helping.  She reports having sharp chest pain as a child that would worsen with deep inspiration.  She reports that symptoms would resolve when she would go about her business.  She reports she does get short of breath with exertion and activity.  This has been attributed to her weight.  She denies any fevers or chills.  She is morbidly obese with a BMI of 45.  She does have severe sleep apnea and wears her CPAP machine.  Blood pressure 138/78.  She has never had a heart attack or stroke.  Her symptoms are not exertional.  They are not alleviated by rest.  Her EKG in office is normal.  There is no strong family history of heart disease.  She does not smoke drink alcohol or use drugs.  She is married with 3 children.  She reports she works at home.  She reports stress may possibly increase her symptoms.  She describes no association with food.  She apparently has tried heartburn  medications without improvement.  She does have asthma but it appears to be well controlled based on recent pulmonary function testing.  Her pain can sometimes be reproduced with palpation.  No positional component.  Problem List Obesity -BMI 45 2. PE -2013 -while on OCP  Past Medical History: Past Medical History:  Diagnosis Date   Anemia    low iron   Asthma    Complication of anesthesia    bp dropped in middle of last c-section and started with n/v   GERD (gastroesophageal reflux disease)    during pregnancy   Headache    "couple days/week" (10/28/2015)   Hyperthyroidism    not on medication   PE (pulmonary embolism) 11/2011   PONV (postoperative nausea and vomiting)    Pregnancy induced hypertension    Preterm labor    Sleep apnea    SOB (shortness of breath)    Thyroid condition    Vitamin D deficiency     Past Surgical History: Past Surgical History:  Procedure Laterality Date   CESAREAN SECTION  2008; 2014   CESAREAN SECTION N/A 07/27/2017   Procedure: REPEAT CESAREAN SECTION;  Surgeon: Donnamae Jude, MD;  Location: [place];  Service: Obstetrics;  Laterality: N/A;   FRACTURE SURGERY  ORIF ANKLE FRACTURE Right 10/28/2015   ORIF ANKLE FRACTURE Right 10/28/2015   Procedure: OPEN REDUCTION INTERNAL FIXATION (ORIF) RIGHT ANKLE FRACTURE;  Surgeon: Nicholes Stairs, MD;  Location: [place];  Service: Orthopedics;  Laterality: Right;   TUBAL LIGATION Bilateral 07/27/2017   Procedure: BILATERAL TUBAL LIGATION;  Surgeon: Donnamae Jude, MD;  Location: [place];  Service: Obstetrics;  Laterality: Bilateral;    Current Medications: Current Meds  Medication Sig   acetaminophen (TYLENOL) 650 MG CR tablet Take 650 mg by mouth every 8 (eight) hours as needed for pain.   albuterol (VENTOLIN HFA) 108 (90 Base) MCG/ACT inhaler Inhale 2 puffs into the lungs every 6 (six) hours as needed for wheezing or shortness of breath.   fluticasone (FLONASE) 50  MCG/ACT nasal spray Place 2 sprays into both nostrils daily.   fluticasone (FLOVENT HFA) 110 MCG/ACT inhaler Inhale 2 puffs into the lungs in the morning and at bedtime.   ibuprofen (ADVIL) 800 MG tablet Take 1 tablet (800 mg total) by mouth 3 (three) times daily.   Vitamin D, Ergocalciferol, (DRISDOL) 1.25 MG (50000 UNIT) CAPS capsule Take 1 capsule (50,000 Units total) by mouth every 7 (seven) days.     Allergies:    Banana and Cefixime   Social History: Social History   Socioeconomic History   Marital status: Married    Spouse name: Not on file   Number of children: 3   Years of education: 14   Highest education level: Not on file  Occupational History   Occupation: Customer Service  Tobacco Use   Smoking status: Never   Smokeless tobacco: Never  Vaping Use   Vaping Use: Never used  Substance and Sexual Activity   Alcohol use: No   Drug use: No   Sexual activity: Yes    Birth control/protection: None, Surgical  Other Topics Concern   Not on file  Social History Narrative   Born in [place], [place] and raised in [place], [place]. Currently resides in an apartment with her husband and 2 children. No pets. Fun: Work, sleep   Denies religious beliefs that would effect health care.    Social Determinants of Health   Financial Resource Strain: Not on file  Food Insecurity: Not on file  Transportation Needs: Not on file  Physical Activity: Not on file  Stress: Not on file  Social Connections: Not on file     Family History: The patient's family history includes Cancer in her maternal grandmother, paternal grandmother, and sister; Diabetes in her father and paternal grandmother; Hyperlipidemia in her paternal grandmother; Hypertension in her father, maternal grandfather, and paternal grandmother; Hypothyroidism in her maternal aunt; Lupus in her mother and another family member; Rheum arthritis in her mother.  ROS:   All other ROS reviewed and negative. Pertinent positives  noted in the HPI.     EKGs/Labs/Other Studies Reviewed:   The following studies were personally reviewed by me today:  EKG:  EKG is ordered today.  The ekg ordered today demonstrates normal sinus rhythm heart rate 79, no acute ischemic changes or evidence of infarction, and was personally reviewed by me.   Recent Labs: 08/15/2020: ALT 21 09/16/2020: TSH 1.520 09/28/2020: BUN 11; Creatinine, Ser 0.49; Hemoglobin 9.3; Platelets 406; Potassium 3.5; Sodium 140   Recent Lipid Panel    Component Value Date/Time   CHOL 155 09/16/2020 0735   TRIG 62 09/16/2020 0735   HDL 46 09/16/2020 0735   LDLCALC 97 09/16/2020 0735  Physical Exam:   VS:  BP 138/78   Pulse 79   Ht 5' 2"  (1.575 m)   Wt 252 lb 6.4 oz (114.5 kg)   BMI 46.16 kg/m    Wt Readings from Last 3 Encounters:  11/17/20 252 lb 6.4 oz (114.5 kg)  11/09/20 244 lb (110.7 kg)  10/26/20 249 lb (112.9 kg)    General: Well nourished, well developed, in no acute distress Head: Atraumatic, normal size  Eyes: PEERLA, EOMI  Neck: Supple, no JVD Endocrine: No thryomegaly Cardiac: Normal S1, S2; RRR; no murmurs, rubs, or gallops Lungs: Clear to auscultation bilaterally, no wheezing, rhonchi or rales  Abd: Soft, nontender, no hepatomegaly  Ext: No edema, pulses 2+ Musculoskeletal: No deformities, BUE and BLE strength normal and equal Skin: Warm and dry, no rashes   Neuro: Alert and oriented to person, place, time, and situation, CNII-XII grossly intact, no focal deficits  Psych: Normal mood and affect   ASSESSMENT:   Debra Horn is a 35 y.o. female who presents for the following: 1. Precordial pain    PLAN:   1. Precordial pain -She describes constant dull sharp pain at times.  Can occur in the right chest as well as left chest.  It occurs constantly.  She describes no identifiable triggers.  No association with food.  It may be worsened by stress.  It is not exertional and not alleviated by rest.  Her symptoms are likely  noncardiac.  Her EKG in office is normal.  Her cardiovascular exam is normal.  Does not appear to be palpable on the left side but can be reproduced on the right side.  No strong family history of heart disease.  Overall I really have a low suspicion for cardiac etiology.  I would like to check an ESR and CRP just to make sure there is no inflammation.  Her EKG is inconsistent with pericarditis.  Given her overall low risk cardiovascular status I would like to check an exercise treadmill stress test just to exclude ischemia.  I think this still could be musculoskeletal in nature.  She describes some improvement with supplements that improved inflammation.  I recommended a short course of ibuprofen 800 mg 3 times daily for 7 days.  I think she should also revisit acid reflux with her primary care physician.  She apparently has only tried 1 medication.  We will start with the above work-up and if negative we will defer further testing to her primary care physician.  Shared Decision Making/Informed Consent The risks [chest pain, shortness of breath, cardiac arrhythmias, dizziness, blood pressure fluctuations, myocardial infarction, stroke/transient ischemic attack, and life-threatening complications (estimated to be 1 in 10,000)], benefits (risk stratification, diagnosing coronary artery disease, treatment guidance) and alternatives of an exercise tolerance test were discussed in detail with Ms. Colcord and she agrees to proceed.  Disposition: Return if symptoms worsen or fail to improve.  Medication Adjustments/Labs and Tests Ordered: Current medicines are reviewed at length with the patient today.  Concerns regarding medicines are outlined above.  Orders Placed This Encounter  Procedures   Sedimentation rate   C-reactive protein   Cardiac Stress Test: Informed Consent Details: Physician/Practitioner Attestation; Transcribe to consent form and obtain patient signature   EXERCISE TOLERANCE TEST (ETT)    EKG 12-Lead    Meds ordered this encounter  Medications   ibuprofen (ADVIL) 800 MG tablet    Sig: Take 1 tablet (800 mg total) by mouth 3 (three) times daily.  Dispense:  21 tablet    Refill:  0     Patient Instructions  Medication Instructions:  Take Ibuprofen 800 mg three times daily for 7 days, then stop   *If you need a refill on your cardiac medications before your next appointment, please call your pharmacy*   Lab Work: ESR, CRP today   If you have labs (blood work) drawn today and your tests are completely normal, you will receive your results only by: [place] (if you have MyChart) OR A paper copy in the mail If you have any lab test that is abnormal or we need to change your treatment, we will call you to review the results.   Testing/Procedures: Your physician has requested that you have an exercise tolerance test, this is a screening tool to track your fitness level. This test evaluates the your exercise capacity by measuring cardiovascular response to exercise, the stress response is induced by exercise (exercise-treadmill).  Graded exercise test is also known as maximal exercise test or stress EKG test  . Please also follow instruction sheet given.    Follow-Up: At [facility], you and your health needs are our priority.  As part of our continuing mission to provide you with exceptional heart care, we have created designated Provider Care Teams.  These Care Teams include your primary Cardiologist (physician) and Advanced Practice Providers (APPs -  Physician Assistants and Nurse Practitioners) who all work together to provide you with the care you need, when you need it.  We recommend signing up for the patient portal called "MyChart".  Sign up information is provided on this After Visit Summary.  MyChart is used to connect with patients for Virtual Visits (Telemedicine).  Patients are able to view lab/test results, encounter notes, upcoming appointments,  etc.  Non-urgent messages can be sent to your provider as well.   To learn more about what you can do with MyChart, go to [email or web address].    Your next appointment:   As needed  The format for your next appointment:   In Person  Provider:   Eleonore Chiquito, MD     Signed, Addison Naegeli. Audie Box, MD, [facility]  [address], [place], [place] [address] [phone]  11/17/2020 10:39 AM

## 2020-11-17 NOTE — Patient Instructions (Signed)
Medication Instructions:  Take Ibuprofen 800 mg three times daily for 7 days, then stop   *If you need a refill on your cardiac medications before your next appointment, please call your pharmacy*   Lab Work: ESR, CRP today   If you have labs (blood work) drawn today and your tests are completely normal, you will receive your results only by: [facility] (if you have MyChart) OR A paper copy in the mail If you have any lab test that is abnormal or we need to change your treatment, we will call you to review the results.   Testing/Procedures: Your physician has requested that you have an exercise tolerance test, this is a screening tool to track your fitness level. This test evaluates the your exercise capacity by measuring cardiovascular response to exercise, the stress response is induced by exercise (exercise-treadmill).  Graded exercise test is also known as maximal exercise test or stress EKG test  . Please also follow instruction sheet given.    Follow-Up: At [facility], you and your health needs are our priority.  As part of our continuing mission to provide you with exceptional heart care, we have created designated Provider Care Teams.  These Care Teams include your primary Cardiologist (physician) and Advanced Practice Providers (APPs -  Physician Assistants and Nurse Practitioners) who all work together to provide you with the care you need, when you need it.  We recommend signing up for the patient portal called "MyChart".  Sign up information is provided on this After Visit Summary.  MyChart is used to connect with patients for Virtual Visits (Telemedicine).  Patients are able to view lab/test results, encounter notes, upcoming appointments, etc.  Non-urgent messages can be sent to your provider as well.   To learn more about what you can do with MyChart, go to [email or web address].    Your next appointment:   As needed  The format for your next appointment:   In  Person  Provider:   Eleonore Chiquito, MD

## 2020-11-18 ENCOUNTER — Emergency Department (HOSPITAL_COMMUNITY)
Admission: EM | Admit: 2020-11-18 | Discharge: 2020-11-18 | Disposition: A | Discharge status: Home / Self Care | Payer: BC Managed Care – PPO | Attending: Emergency Medicine | Admitting: Emergency Medicine

## 2020-11-18 ENCOUNTER — Emergency Department (HOSPITAL_COMMUNITY): Payer: BC Managed Care – PPO

## 2020-11-18 ENCOUNTER — Encounter (HOSPITAL_COMMUNITY): Payer: Self-pay | Admitting: Emergency Medicine

## 2020-11-18 ENCOUNTER — Other Ambulatory Visit: Payer: Self-pay

## 2020-11-18 ENCOUNTER — Telehealth (HOSPITAL_COMMUNITY): Payer: Self-pay | Admitting: *Deleted

## 2020-11-18 DIAGNOSIS — Z7951 Long term (current) use of inhaled steroids: Secondary | ICD-10-CM | POA: Diagnosis not present

## 2020-11-18 DIAGNOSIS — Z20822 Contact with and (suspected) exposure to covid-19: Secondary | ICD-10-CM | POA: Insufficient documentation

## 2020-11-18 DIAGNOSIS — N9489 Other specified conditions associated with female genital organs and menstrual cycle: Secondary | ICD-10-CM | POA: Insufficient documentation

## 2020-11-18 DIAGNOSIS — R42 Dizziness and giddiness: Secondary | ICD-10-CM | POA: Diagnosis not present

## 2020-11-18 DIAGNOSIS — R110 Nausea: Secondary | ICD-10-CM | POA: Insufficient documentation

## 2020-11-18 DIAGNOSIS — R9431 Abnormal electrocardiogram [ECG] [EKG]: Secondary | ICD-10-CM | POA: Diagnosis not present

## 2020-11-18 DIAGNOSIS — J45909 Unspecified asthma, uncomplicated: Secondary | ICD-10-CM | POA: Insufficient documentation

## 2020-11-18 LAB — BASIC METABOLIC PANEL
Anion gap: 8 (ref 5–15)
BUN: 16 mg/dL (ref 6–20)
CO2: 27 mmol/L (ref 22–32)
Calcium: 9.5 mg/dL (ref 8.9–10.3)
Chloride: 105 mmol/L (ref 98–111)
Creatinine, Ser: 0.82 mg/dL (ref 0.44–1.00)
GFR, Estimated: >60 mL/min (ref >60)
Glucose, Bld: 106 mg/dL — ABNORMAL HIGH (ref 70–99)
Potassium: 3.7 mmol/L (ref 3.5–5.1)
Sodium: 140 mmol/L (ref 135–145)

## 2020-11-18 LAB — CBG MONITORING, ED: Glucose-Capillary: 101 mg/dL — ABNORMAL HIGH (ref 70–99)

## 2020-11-18 LAB — C-REACTIVE PROTEIN: CRP: 26 mg/L — ABNORMAL HIGH (ref 0–10)

## 2020-11-18 LAB — CBC
HCT: 31 % — ABNORMAL LOW (ref 36.0–46.0)
Hemoglobin: 9.4 g/dL — ABNORMAL LOW (ref 12.0–15.0)
MCH: 24.5 pg — ABNORMAL LOW (ref 26.0–34.0)
MCHC: 30.3 g/dL (ref 30.0–36.0)
MCV: 80.7 fL (ref 80.0–100.0)
Platelets: 475 10*3/uL — ABNORMAL HIGH (ref 150–400)
RBC: 3.84 MIL/uL — ABNORMAL LOW (ref 3.87–5.11)
RDW: 17.5 % — ABNORMAL HIGH (ref 11.5–15.5)
WBC: 9 10*3/uL (ref 4.0–10.5)
nRBC: 0 % (ref 0.0–0.2)

## 2020-11-18 LAB — SEDIMENTATION RATE: Sed Rate: 96 mm/hr — ABNORMAL HIGH (ref 0–32)

## 2020-11-18 LAB — I-STAT BETA HCG BLOOD, ED (MC, WL, AP ONLY): I-stat hCG, quantitative: <5 m[IU]/mL (ref <5)

## 2020-11-18 LAB — RESP PANEL BY RT-PCR (FLU A&B, COVID) ARPGX2
Influenza A by PCR: NEGATIVE
Influenza B by PCR: NEGATIVE
SARS Coronavirus 2 by RT PCR: NEGATIVE

## 2020-11-18 LAB — TROPONIN I (HIGH SENSITIVITY)
Troponin I (High Sensitivity): <2 ng/L (ref <18)
Troponin I (High Sensitivity): 2 ng/L (ref <18)

## 2020-11-18 MED ORDER — ONDANSETRON 4 MG PO TBDP: 4.0000 mg | ORAL_TABLET | Freq: Three times a day (TID) | ORAL | 0 refills | Status: DC | PRN

## 2020-11-18 MED ORDER — MECLIZINE HCL 25 MG PO TABS: 25.0000 mg | ORAL_TABLET | Freq: Three times a day (TID) | ORAL | 0 refills | Status: DC | PRN

## 2020-11-18 MED ORDER — LORAZEPAM 2 MG/ML IJ SOLN
1.0000 mg | Freq: Once | INTRAMUSCULAR | Status: AC
  Administered 2020-11-18: 09:00:00 1 mg via INTRAVENOUS
  Filled 2020-11-18: qty 1

## 2020-11-18 MED ORDER — SODIUM CHLORIDE 0.9 % IV BOLUS
1000.0000 mL | Freq: Once | INTRAVENOUS | Status: AC
  Administered 2020-11-18: 09:00:00 1000 mL via INTRAVENOUS

## 2020-11-18 MED ORDER — MECLIZINE HCL 25 MG PO TABS
25.0000 mg | ORAL_TABLET | Freq: Once | ORAL | Status: AC
  Administered 2020-11-18: 09:00:00 25 mg via ORAL
  Filled 2020-11-18: qty 1

## 2020-11-18 MED ORDER — LORAZEPAM 1 MG PO TABS: 1.0000 mg | ORAL_TABLET | Freq: Three times a day (TID) | ORAL | 0 refills | Status: DC | PRN

## 2020-11-18 MED ORDER — ONDANSETRON HCL 4 MG/2ML IJ SOLN
4.0000 mg | Freq: Once | INTRAMUSCULAR | Status: AC
  Administered 2020-11-18: 09:00:00 4 mg via INTRAVENOUS
  Filled 2020-11-18: qty 2

## 2020-11-18 NOTE — ED Triage Notes (Signed)
Patient complaining of mid chest pain and dizziness. Patient states that she had a syncopal episode.

## 2020-11-18 NOTE — Telephone Encounter (Signed)
Close encounter 

## 2020-11-18 NOTE — ED Notes (Signed)
Pt walked to restroom 

## 2020-11-18 NOTE — ED Notes (Signed)
RN assisted pt to restroom. RN said pt did well.

## 2020-11-18 NOTE — ED Provider Notes (Signed)
[facility]-EMERGENCY DEPT Provider Note   CSN: [id number] Arrival date & time: 11/18/20  0451     History Chief Complaint  Patient presents with   Dizziness    Debra Horn is a 35 y.o. female.  Pt presents to the ED today with dizziness.  Pt said she had some nausea and dizziness last night before going to bed.  She woke up in the night feeling worse.  Sx are worse when she turns her eyes or head to the left.  Pt denies f/c.      Past Medical History:  Diagnosis Date   Anemia    low iron   Asthma    Complication of anesthesia    bp dropped in middle of last c-section and started with n/v   GERD (gastroesophageal reflux disease)    during pregnancy   Headache    "couple days/week" (10/28/2015)   Hyperthyroidism    not on medication   PE (pulmonary embolism) 11/2011   PONV (postoperative nausea and vomiting)    Pregnancy induced hypertension    Preterm labor    Sleep apnea    SOB (shortness of breath)    Thyroid condition    Vitamin D deficiency     Patient Active Problem List   Diagnosis Date Noted   Nasal drainage 11/09/2020   Insulin resistance 10/26/2020   Other chest pain 10/13/2020   Mild persistent asthma 06/24/2020   Severe sleep apnea 06/24/2020   BMI 45.0-49.9, adult (HCC) 06/24/2020   Status post repeat low transverse cesarean section 07/27/2017   Poor fetal growth affecting management of mother in third trimester    Previous pregnancy complicated by pregnancy-induced hypertension in third trimester, antepartum    Unwanted fertility 06/06/2017   IUGR (intrauterine growth restriction) in prior pregnancy, pregnant 03/06/2017   Vitamin D deficiency 02/17/2017   History of preterm delivery 02/09/2017   History of asthma    Normocytic anemia 02/06/2016   Family history of systemic lupus erythematosus (SLE) in mother 02/19/2014   Hyperthyroidism 02/19/2014   Previous cesarean section 08/08/2012   Hx pulmonary embolism  07/24/2012    Past Surgical History:  Procedure Laterality Date   CESAREAN SECTION  2008; 2014   CESAREAN SECTION N/A 07/27/2017   Procedure: REPEAT CESAREAN SECTION;  Surgeon: Reva Bores, MD;  Location: [facility];  Service: Obstetrics;  Laterality: N/A;   FRACTURE SURGERY     ORIF ANKLE FRACTURE Right 10/28/2015   ORIF ANKLE FRACTURE Right 10/28/2015   Procedure: OPEN REDUCTION INTERNAL FIXATION (ORIF) RIGHT ANKLE FRACTURE;  Surgeon: Yolonda Kida, MD;  Location: [facility];  Service: Orthopedics;  Laterality: Right;   TUBAL LIGATION Bilateral 07/27/2017   Procedure: BILATERAL TUBAL LIGATION;  Surgeon: Reva Bores, MD;  Location: [facility] BIRTHING SU[facility];  Service: Obstetrics;  Laterality: Bilateral;     OB History     Gravida  3   Para  3   Term  1   Preterm  2   AB      Living  3      SAB      IAB      Ectopic      Multiple  0   Live Births  3           Family History  Problem Relation Age of Onset   Hypertension Maternal Grandfather    Hypertension Paternal Grandmother    Cancer Paternal Grandmother    Hyperlipidemia Paternal Grandmother  Diabetes Paternal Grandmother    Hypertension Father    Diabetes Father    Cancer Sister        skin   Cancer Maternal Grandmother        leukemia   Rheum arthritis Mother    Lupus Mother    Hypothyroidism Maternal Aunt    Lupus Other     Social History   Tobacco Use   Smoking status: Never   Smokeless tobacco: Never  Vaping Use   Vaping Use: Never used  Substance Use Topics   Alcohol use: No   Drug use: No    Home Medications Prior to Admission medications   Medication Sig Start Date End Date Taking? Authorizing Provider  LORazepam (ATIVAN) 1 MG tablet Take 1 tablet (1 mg total) by mouth every 8 (eight) hours as needed (dizziness). 11/18/20  Yes Jacalyn Lefevre, MD  meclizine (ANTIVERT) 25 MG tablet Take 1 tablet (25 mg total) by mouth 3 (three) times daily as needed for dizziness.  11/18/20  Yes Jacalyn Lefevre, MD  ondansetron (ZOFRAN ODT) 4 MG disintegrating tablet Take 1 tablet (4 mg total) by mouth every 8 (eight) hours as needed for nausea or vomiting. 11/18/20  Yes Jacalyn Lefevre, MD  acetaminophen (TYLENOL) 650 MG CR tablet Take 650 mg by mouth every 8 (eight) hours as needed for pain.    [provider]  albuterol (VENTOLIN HFA) 108 (90 Base) MCG/ACT inhaler Inhale 2 puffs into the lungs every 6 (six) hours as needed for wheezing or shortness of breath. 06/25/20   Glenford Bayley, NP  fluticasone (FLONASE) 50 MCG/ACT nasal spray Place 2 sprays into both nostrils daily. 11/09/20   Danford, Orpha Bur D, NP  fluticasone (FLOVENT HFA) 110 MCG/ACT inhaler Inhale 2 puffs into the lungs in the morning and at bedtime. 06/24/20   Glenford Bayley, NP  ibuprofen (ADVIL) 800 MG tablet Take 1 tablet (800 mg total) by mouth 3 (three) times daily. 11/17/20   O'NealRonnald Ramp, MD  Vitamin D, Ergocalciferol, (DRISDOL) 1.25 MG (50000 UNIT) CAPS capsule Take 1 capsule (50,000 Units total) by mouth every 7 (seven) days. 10/26/20   William Hamburger D, NP    Allergies    Banana and Cefixime  Review of Systems   Review of Systems  Gastrointestinal:  Positive for nausea and vomiting.  Neurological:  Positive for dizziness.  All other systems reviewed and are negative.  Physical Exam Updated Vital Signs BP (!) 123/58   Pulse 69   Temp 98.2 F (36.8 C) (Oral)   Resp 17   Ht 5\' 2"  (1.575 m)   Wt 114.3 kg   LMP 11/04/2020   SpO2 100%   BMI 46.09 kg/m   Physical Exam Vitals and nursing note reviewed.  Constitutional:      Appearance: Normal appearance. She is obese.  HENT:     Head: Normocephalic and atraumatic.     Right Ear: Tympanic membrane, ear canal and external ear normal.     Left Ear: Tympanic membrane, ear canal and external ear normal.     Nose: Nose normal.     Mouth/Throat:     Mouth: Mucous membranes are dry.  Eyes:     Extraocular Movements:  Extraocular movements intact.     Conjunctiva/sclera: Conjunctivae normal.     Pupils: Pupils are equal, round, and reactive to light.  Cardiovascular:     Rate and Rhythm: Normal rate and regular rhythm.     Pulses: Normal pulses.  Heart sounds: Normal heart sounds.  Pulmonary:     Effort: Pulmonary effort is normal.     Breath sounds: Normal breath sounds.  Abdominal:     General: Abdomen is flat. Bowel sounds are normal.     Palpations: Abdomen is soft.  Musculoskeletal:        General: Normal range of motion.     Cervical back: Normal range of motion and neck supple.  Skin:    General: Skin is warm.     Capillary Refill: Capillary refill takes less than 2 seconds.  Neurological:     General: No focal deficit present.     Mental Status: She is alert and oriented to person, place, and time.     Comments: Dizziness/nausea when head is turned to the left  Psychiatric:        Mood and Affect: Mood normal.        Behavior: Behavior normal.        Thought Content: Thought content normal.        Judgment: Judgment normal.    ED Results / Procedures / Treatments   Labs (all labs ordered are listed, but only abnormal results are displayed) Labs Reviewed  BASIC METABOLIC PANEL - Abnormal; Notable for the following components:      Result Value   Glucose, Bld 106 (*)    All other components within normal limits  CBC - Abnormal; Notable for the following components:   RBC 3.84 (*)    Hemoglobin 9.4 (*)    HCT 31.0 (*)    MCH 24.5 (*)    RDW 17.5 (*)    Platelets 475 (*)    All other components within normal limits  CBG MONITORING, ED - Abnormal; Notable for the following components:   Glucose-Capillary 101 (*)    All other components within normal limits  RESP PANEL BY RT-PCR (FLU A&B, COVID) ARPGX2  I-STAT BETA HCG BLOOD, ED ([facility], [facility], [facility] ONLY)  TROPONIN I (HIGH SENSITIVITY)  TROPONIN I (HIGH SENSITIVITY)    EKG EKG Interpretation  Date/Time:  Thursday November 18 2020 05:10:29 EST Ventricular Rate:  89 PR Interval:  129 QRS Duration: 84 QT Interval:  355 QTC Calculation: 432 R Axis:   63 Text Interpretation: Sinus rhythm Normal ECG Confirmed by Geoffery Lyons ([id number]) on 11/18/2020 5:48:25 AM  Radiology DG Chest 2 View  Result Date: 11/18/2020 CLINICAL DATA:  Chest pain EXAM: CHEST - 2 VIEW COMPARISON:  09/28/2020 FINDINGS: The lungs are clear without focal pneumonia, edema, pneumothorax or pleural effusion. The cardiopericardial silhouette is within normal limits for size. The visualized bony structures of the thorax show no acute abnormality. Telemetry leads overlie the chest. IMPRESSION: No active cardiopulmonary disease. Electronically Signed   By: Kennith Center M.D.   On: 11/18/2020 05:50    Procedures Procedures   Medications Ordered in ED Medications  sodium chloride 0.9 % bolus 1,000 mL (1,000 mLs Intravenous New Bag/Given 11/18/20 0905)  LORazepam (ATIVAN) injection 1 mg (1 mg Intravenous Given 11/18/20 0903)  ondansetron (ZOFRAN) injection 4 mg (4 mg Intravenous Given 11/18/20 0902)  meclizine (ANTIVERT) tablet 25 mg (25 mg Oral Given 11/18/20 0900)    ED Course  I have reviewed the triage vital signs and the nursing notes.  Pertinent labs & imaging results that were available during my care of the patient were reviewed by me and considered in my medical decision making (see chart for details).    MDM Rules/Calculators/A&P  Pt is feeling much better after treatment.  She is able to ambulate without any problems.  She is stable for d/c.  She is to return if worse.  F/u with pcp. Final Clinical Impression(s) / ED Diagnoses Final diagnoses:  Vertigo    Rx / DC Orders ED Discharge Orders          Ordered    meclizine (ANTIVERT) 25 MG tablet  3 times daily PRN        11/18/20 1045    ondansetron (ZOFRAN ODT) 4 MG disintegrating tablet  Every 8 hours PRN        11/18/20 1045    LORazepam (ATIVAN)  1 MG tablet  Every 8 hours PRN        11/18/20 1045             Jacalyn Lefevre, MD 11/18/20 1046

## 2020-11-19 ENCOUNTER — Telehealth: Payer: Self-pay

## 2020-11-19 ENCOUNTER — Ambulatory Visit (HOSPITAL_COMMUNITY)
Admission: RE | Admit: 2020-11-19 | Payer: Medicaid Other | Source: Ambulatory Visit | Attending: Cardiovascular Disease | Admitting: Cardiovascular Disease

## 2020-11-19 NOTE — Telephone Encounter (Signed)
Transition Care Management Unsuccessful Follow-up Telephone Call  Date of discharge and from where:  11/18/2020 from [facility]  Attempts:  1st Attempt  Reason for unsuccessful TCM follow-up call:  Left voice message

## 2020-11-20 ENCOUNTER — Telehealth: Payer: BC Managed Care – PPO | Admitting: Nurse Practitioner

## 2020-11-20 DIAGNOSIS — J45909 Unspecified asthma, uncomplicated: Secondary | ICD-10-CM

## 2020-11-20 MED ORDER — ALBUTEROL SULFATE HFA 108 (90 BASE) MCG/ACT IN AERS
2.0000 | INHALATION_SPRAY | Freq: Four times a day (QID) | RESPIRATORY_TRACT | 0 refills | Status: DC | PRN
Start: 2020-11-20 — End: 2020-12-23

## 2020-11-20 NOTE — Progress Notes (Signed)
Visit for Asthma  Based on what you have shared with me, it looks like you may have a flare up of your asthma.  Asthma is a chronic (ongoing) lung disease which results in airway obstruction, inflammation and hyper-responsiveness.   Asthma symptoms vary from person to person, with common symptoms including nighttime awakening and decreased ability to participate in normal activities as a result of shortness of breath. It is often triggered by changes in weather, changes in the season, changes in air temperature, or inside (home, school, daycare or work) allergens such as animal dander, mold, mildew, woodstoves or cockroaches.   It can also be triggered by hormonal changes, extreme emotion, physical exertion or an upper respiratory tract illness.     It is important to identify the trigger, and then eliminate or avoid the trigger if possible.   If you have been prescribed medications to be taken on a regular basis, it is important to follow the asthma action plan and to follow guidelines to adjust medication in response to increasing symptoms of decreased peak expiratory flow rate  Treatment: I have prescribed: Albuterol (Proventil HFA; Ventolin HFA) 108 (90 Base) MCG/ACT Inhaler 2 puffs into the lungs every six hours as needed for wheezing or shortness of breath  HOME CARE . Only take medications as instructed by your medical team. . Consider wearing a mask or scarf to improve breathing air temperature have been shown to decrease irritation and decrease exacerbations . Get rest. . Taking a steamy shower or using a humidifier may help nasal congestion sand ease sore throat pain. You can place a towel over your head and breathe in the steam from hot water coming from a faucet. . Using a saline nasal spray works much the same way.  . Cough drops, hare candies and sore throat lozenges may  ease your cough.  . Avoid close contacts especially the very you and the elderly . Cover your mouth if you cough or sneeze . Always remember to wash your hands.    GET HELP RIGHT AWAY IF: . You develop worsening symptoms; breathlessness at rest, drowsy, confused or agitated, unable to speak in full sentences . You have coughing fits . You develop a severe headache or visual changes . You develop shortness of breath, difficulty breathing or start having chest pain . Your symptoms persist after you have completed your treatment plan . If your symptoms do not improve within 10 days  MAKE SURE YOU . Understand these instructions. . Will watch your condition. . Will get help right away if you are not doing well or get worse.   Your e-visit answers were reviewed by a board certified advanced clinical practitioner to complete your personal care plan, Depending upon the condition, your plan could have included both over the counter or prescription medications.  Please review your pharmacy choice. Your safety is important to us. If you have drug allergies check your prescription carefully. You can use MyChart to ask questions about today's visit, request a non-urgent call back, or ask for a work or school excuse for 24 hours related to this e-Visit. If it has been greater than 24 hours you will need to follow up with your provider, or enter a new e-Visit to address those concerns.  You will get an e-mail in the next two days asking about your experience. I hope that your e-visit has been valuable and will speed your recovery. Thank you for using e-visits.  5-10 minutes spent reviewing and documenting 

## 2020-11-22 ENCOUNTER — Other Ambulatory Visit: Payer: Self-pay

## 2020-11-22 DIAGNOSIS — R079 Chest pain, unspecified: Secondary | ICD-10-CM

## 2020-11-22 NOTE — Telephone Encounter (Signed)
Transition Care Management Follow-up Telephone Call Date of discharge and from where: 11/18/2020 from [facility] How have you been since you were released from the hospital? Pt stated that she is still having the same symptoms. Pt stated that she has taken the rx that was sent in from the ED. Pt stated that they help but for a very short period of time.  Any questions or concerns? No  Items Reviewed: Did the pt receive and understand the discharge instructions provided? Yes  Medications obtained and verified? Yes  Other? No  Any new allergies since your discharge? No  Dietary orders reviewed? No Do you have support at home? Yes   Functional Questionnaire: (I = Independent and D = Dependent) ADLs: I  Bathing/Dressing- I  Meal Prep- I  Eating- I  Maintaining continence- I  Transferring/Ambulation- I  Managing Meds- I   Follow up appointments reviewed:  PCP Hospital f/u appt confirmed? No   Specialist Hospital f/u appt confirmed? Yes  Scheduled to see Cardiovascular Imaging on 11/24/2020. Are transportation arrangements needed? No  If their condition worsens, is the pt aware to call PCP or go to the Emergency Dept.? Yes Was the patient provided with contact information for the PCP's office or ED? Yes Was to pt encouraged to call back with questions or concerns? Yes

## 2020-11-23 ENCOUNTER — Telehealth (HOSPITAL_COMMUNITY): Payer: Self-pay | Admitting: *Deleted

## 2020-11-23 NOTE — Telephone Encounter (Signed)
Close encounter 

## 2020-11-24 ENCOUNTER — Ambulatory Visit (HOSPITAL_COMMUNITY)
Admission: RE | Admit: 2020-11-24 | Discharge: 2020-11-24 | Disposition: A | Discharge status: Home / Self Care | Payer: BC Managed Care – PPO | Source: Ambulatory Visit | Attending: Cardiovascular Disease | Admitting: Cardiovascular Disease

## 2020-11-24 ENCOUNTER — Other Ambulatory Visit: Payer: Self-pay

## 2020-11-24 DIAGNOSIS — R072 Precordial pain: Secondary | ICD-10-CM | POA: Insufficient documentation

## 2020-11-24 LAB — EXERCISE TOLERANCE TEST
Estimated workload: 5.1
Exercise duration (min): 3 min
Exercise duration (sec): 29 s
MPHR: 185 {beats}/min
Peak HR: 171 {beats}/min
Percent HR: 92 %
Rest HR: 75 {beats}/min
ST Depression (mm): 0 mm

## 2020-12-01 ENCOUNTER — Encounter (INDEPENDENT_AMBULATORY_CARE_PROVIDER_SITE_OTHER): Payer: Self-pay | Admitting: Adult Health

## 2020-12-01 ENCOUNTER — Other Ambulatory Visit: Payer: Self-pay

## 2020-12-01 ENCOUNTER — Ambulatory Visit (INDEPENDENT_AMBULATORY_CARE_PROVIDER_SITE_OTHER): Payer: BC Managed Care – PPO | Admitting: Adult Health

## 2020-12-01 VITALS — BP 126/81 | HR 65 | Temp 98.1°F | Ht 62.0 in | Wt 249.0 lb

## 2020-12-01 DIAGNOSIS — Z6841 Body Mass Index (BMI) 40.0 and over, adult: Secondary | ICD-10-CM

## 2020-12-01 DIAGNOSIS — E8881 Metabolic syndrome: Secondary | ICD-10-CM | POA: Diagnosis not present

## 2020-12-01 DIAGNOSIS — R700 Elevated erythrocyte sedimentation rate: Secondary | ICD-10-CM | POA: Diagnosis not present

## 2020-12-01 DIAGNOSIS — D649 Anemia, unspecified: Secondary | ICD-10-CM

## 2020-12-01 DIAGNOSIS — R7982 Elevated C-reactive protein (CRP): Secondary | ICD-10-CM | POA: Insufficient documentation

## 2020-12-01 NOTE — Progress Notes (Signed)
Chief Complaint:   OBESITY Debra Horn is here to discuss her progress with her obesity treatment plan along with follow-up of her obesity related diagnoses. Debra Horn is on the Category 2 Plan and states she is following her eating plan approximately 90% of the time. Debra Horn states she walked on Thanksgiving for 60 minutes 1 time.  Today's visit was #: 6 Starting weight: 244 lbs Starting date: 09/15/2020 Today's weight: 249 lbs Today's date: 12/01/2020 Total lbs lost to date: 0 Total lbs lost since last in-office visit: 0  Interim History:  Debra Horn says she deviated from Category 2 for several days over the holiday.   She is not terribly surprised that her weight is up. She is now back on track.  Of Note- recent Cardiology appt. Labs demonstrated markedly elevated CRP and ESR. Previous work-up in 2013 by her then PCP- was essentially normal. She cannot recall exact reason as to why she had this specific work-up. She denies diffuse pain at that time (she was in mid 28s in 2013).  Subjective:   1. Normocytic anemia She is only on an OTC multivitamin. In the past, received iron infusions (unable to tolerate - dizziness, nausea).  Last infusion and contact with Hematologist- 2013. Epic review demonstrates chronic normocytic anemia.  2. CRP elevated Denies first-degree degree family with any autoimmune disorder. She has 3 second-degree cousins who all have SLE (all in same family and on maternal side). She reports constant muscle aches with intermittent bilateral ankle pain, bilateral knee pain, bilateral hand pain. She has always believed that diffuse pain was r/t to obesity.  3. ESR raised Denies first-degree degree family with any autoimmune disorder. She has 3 second-degree cousins who all have SLE (all in same family and on maternal side). She reports constant muscle aches with intermittent bilateral ankle pain, bilateral knee pain, bilateral hand pain. She has always believed  that diffuse pain was r/t to obesity.  4. Insulin resistance On 09/16/2020, insulin level 16.1.  Assessment/Plan:   1. Normocytic anemia Refer to Hematology.  - Ambulatory referral to Hematology / Oncology  2. CRP elevated Refer to Rheumatology.  - Ambulatory referral to Rheumatology  3. ESR raised Refer to Rheumatology.  - Ambulatory referral to Rheumatology  4. Insulin resistance Check fasting labs at next office visit.  5. Obesity with current BMI of 45.7  Debra Horn is currently in the action stage of change. As such, her goal is to continue with weight loss efforts. She has agreed to the Category 2 Plan.   Check fasting labs at next office visit.  Establish with PCP - local provider information given to patient.  Exercise goals:  As is.  Behavioral modification strategies: increasing lean protein intake, decreasing simple carbohydrates, meal planning and cooking strategies, keeping healthy foods in the home, and planning for success.  Debra Horn has agreed to follow-up with our clinic in 3 weeks, fasting. She was informed of the importance of frequent follow-up visits to maximize her success with intensive lifestyle modifications for her multiple health conditions.   Objective:   Blood pressure 126/81, pulse 65, temperature 98.1 F (36.7 C), height _0  (1.575 m), weight 249 lb (112.9 kg), last menstrual period 11/04/2020, SpO2 98 %. Body mass index is 45.54 kg/m.  General: Cooperative, alert, well developed, in no acute distress. HEENT: Conjunctivae and lids unremarkable. Cardiovascular: Regular rhythm.  Lungs: Normal work of breathing. Neurologic: No focal deficits.   Lab Results  Component Value Date   CREATININE 0.82 11/18/2020  BUN 16 11/18/2020   NA 140 11/18/2020   K 3.7 11/18/2020   CL 105 11/18/2020   CO2 27 11/18/2020   Lab Results  Component Value Date   ALT 21 08/15/2020   AST 18 08/15/2020   ALKPHOS 104 08/15/2020   BILITOT 0.5 08/15/2020    Lab Results  Component Value Date   HGBA1C 5.4 09/16/2020   HGBA1C 5.0 02/09/2017   HGBA1C 5.6 02/19/2014   Lab Results  Component Value Date   INSULIN 16.1 09/16/2020   Lab Results  Component Value Date   TSH 1.520 09/16/2020   Lab Results  Component Value Date   CHOL 155 09/16/2020   HDL 46 09/16/2020   LDLCALC 97 09/16/2020   TRIG 62 09/16/2020   Lab Results  Component Value Date   VD25OH 18.1 (L) 09/16/2020   VD25OH 8.4 (L) 02/09/2017   Lab Results  Component Value Date   WBC 9.0 11/18/2020   HGB 9.4 (L) 11/18/2020   HCT 31.0 (L) 11/18/2020   MCV 80.7 11/18/2020   PLT 475 (H) 11/18/2020   Attestation Statements:   Reviewed by clinician on day of visit: allergies, medications, problem list, medical history, surgical history, family history, social history, and previous encounter notes.  Time spent on visit including pre-visit chart review and post-visit care and charting was 32 minutes.   I, Water quality scientist, CMA, am acting as Location manager for Mina Marble, NP.  I have reviewed the above documentation for accuracy and completeness, and I agree with the above. -  Bron Snellings d. Ranie Chinchilla, NP-C

## 2020-12-02 ENCOUNTER — Other Ambulatory Visit: Payer: Self-pay | Admitting: Cardiovascular Disease

## 2020-12-02 ENCOUNTER — Telehealth: Payer: Self-pay | Admitting: Physician Assistant

## 2020-12-02 ENCOUNTER — Other Ambulatory Visit: Payer: Self-pay | Admitting: Nurse Practitioner

## 2020-12-02 ENCOUNTER — Telehealth: Payer: BC Managed Care – PPO | Admitting: Emergency Medicine

## 2020-12-02 DIAGNOSIS — J45901 Unspecified asthma with (acute) exacerbation: Secondary | ICD-10-CM

## 2020-12-02 MED ORDER — ALBUTEROL SULFATE HFA 108 (90 BASE) MCG/ACT IN AERS: 2.0000 | INHALATION_SPRAY | Freq: Four times a day (QID) | RESPIRATORY_TRACT | 0 refills | Status: DC | PRN

## 2020-12-02 NOTE — Progress Notes (Signed)
Visit for Asthma  Based on what you have shared with me, it looks like you may have a flare up of your asthma.  Asthma is a chronic (ongoing) lung disease which results in airway obstruction, inflammation and hyper-responsiveness.   You also had an E-visit for asthma on 11/20/2020.  I am worried that you are needing your albuterol inhaler so much that you use that up in 2 weeks.  I think you may need to have your asthma treatment plan evaluated by a primary care provider.  You may need preventive medicines more than the Flovent to help keep your asthma under control.  Asthma symptoms vary from person to person, with common symptoms including nighttime awakening and decreased ability to participate in normal activities as a result of shortness of breath. It is often triggered by changes in weather, changes in the season, changes in air temperature, or inside (home, school, daycare or work) allergens such as animal dander, mold, mildew, woodstoves or cockroaches.   It can also be triggered by hormonal changes, extreme emotion, physical exertion or an upper respiratory tract illness.     It is important to identify the trigger, and then eliminate or avoid the trigger if possible.   If you have been prescribed medications to be taken on a regular basis, it is important to follow the asthma action plan and to follow guidelines to adjust medication in response to increasing symptoms of decreased peak expiratory flow rate  Treatment: I have prescribed: Albuterol (Proventil HFA; Ventolin HFA) 108 (90 Base) MCG/ACT Inhaler 2 puffs into the lungs every six hours as needed for wheezing or shortness of breath  HOME CARE Only take medications as instructed by your medical team. Consider wearing a mask or scarf to improve breathing air temperature have been shown to decrease irritation and decrease  exacerbations Get rest. Taking a steamy shower or using a humidifier may help nasal congestion sand ease sore throat pain. You can place a towel over your head and breathe in the steam from hot water coming from a faucet. Using a saline nasal spray works much the same way.  Cough drops, hare candies and sore throat lozenges may ease your cough.  Avoid close contacts especially the very you and the elderly Cover your mouth if you cough or sneeze Always remember to wash your hands.    GET HELP RIGHT AWAY IF: You develop worsening symptoms; breathlessness at rest, drowsy, confused or agitated, unable to speak in full sentences You have coughing fits You develop a severe headache or visual changes You develop shortness of breath, difficulty breathing or start having chest pain Your symptoms persist after you have completed your treatment plan If your symptoms do not improve within 10 days  MAKE SURE YOU Understand these instructions. Will watch your condition. Will get help right away if you are not doing well or get worse.   Your e-visit answers were reviewed by a board certified advanced clinical practitioner to complete your personal care plan, Depending upon the condition, your plan could have included both over the counter or prescription medications.   Please review your pharmacy choice. Your safety is important to [place]. If you have drug allergies check your prescription carefully.  You can use MyChart to ask questions about today's visit, request a non-urgent  call back, or ask for a work or school excuse for 24 hours related to this e-Visit. If it has been greater than 24 hours you will need to follow up with  your provider, or enter a new e-Visit to address those concerns.   You will get an e-mail in the next two days asking about your experience. I hope that your e-visit has been valuable and will speed your recovery. Thank you for using e-visits.   I have spent 5 minutes in  review of e-visit questionnaire, review and updating patient chart, medical decision making and response to patient.   Rica Mast, PhD, FNP-BC

## 2020-12-02 NOTE — Telephone Encounter (Signed)
Scheduled appt per 11/29 referral. Pt is aware of appt date and time. Pt is also aware to arrive 15 mins prior to appt.

## 2020-12-11 ENCOUNTER — Other Ambulatory Visit (INDEPENDENT_AMBULATORY_CARE_PROVIDER_SITE_OTHER): Payer: Self-pay | Admitting: Adult Health

## 2020-12-11 DIAGNOSIS — J3489 Other specified disorders of nose and nasal sinuses: Secondary | ICD-10-CM

## 2020-12-13 NOTE — Telephone Encounter (Signed)
LAST APPOINTMENT DATE: 12/01/20 NEXT APPOINTMENT DATE: 12/23/20   [id number] - [place], [place] - [address] [address] Phone: [phone] Fax: [phone]  Patient is requesting a refill of the following medications: Pending Prescriptions:                       Disp   Refills   fluticasone (FLONASE) 50 MCG/ACT nasal spr*16 mL          Sig: SPRAY 2 SPRAYS INTO EACH NOSTRIL EVERY DAY   Date last filled: 11/09/20 Previously prescribed by [facility]  Lab Results      Component                Value               Date                      HGBA1C                   5.4                 09/16/2020                HGBA1C                   5.0                 02/09/2017                HGBA1C                   5.6                 02/19/2014           Lab Results      Component                Value               Date                      LDLCALC                  97                  09/16/2020                CREATININE               0.82                11/18/2020           Lab Results      Component                Value               Date                      VD25OH                   18.1 (L)            09/16/2020                VD25OH                   8.4 (L)  02/09/2017            BP Readings from Last 3 Encounters: 12/01/20 : 126/81 11/18/20 : 122/70 11/17/20 : 138/78

## 2020-12-13 NOTE — Telephone Encounter (Signed)
Pt last seen by Katy Danford, FNP.  

## 2020-12-14 ENCOUNTER — Other Ambulatory Visit: Payer: Self-pay

## 2020-12-14 ENCOUNTER — Telehealth: Payer: Self-pay | Admitting: Oncology

## 2020-12-14 ENCOUNTER — Ambulatory Visit (HOSPITAL_COMMUNITY): Payer: BC Managed Care – PPO | Attending: Cardiovascular Disease

## 2020-12-14 DIAGNOSIS — R079 Chest pain, unspecified: Secondary | ICD-10-CM | POA: Insufficient documentation

## 2020-12-14 LAB — ECHOCARDIOGRAM COMPLETE
Area-P 1/2: 3.48 cm2
S' Lateral: 3.2 cm

## 2020-12-14 NOTE — Telephone Encounter (Signed)
R/s pt's new hem appt due to Debra Horn being out of the office on 12/14. I called pt, no answer. I left a msg with new appt date and time. I requested pt call me back to confirm change.

## 2020-12-15 ENCOUNTER — Inpatient Hospital Stay: Payer: Medicaid Other

## 2020-12-15 ENCOUNTER — Inpatient Hospital Stay: Payer: Medicaid Other | Admitting: Physician Assistant

## 2020-12-16 ENCOUNTER — Other Ambulatory Visit: Payer: Self-pay | Admitting: Cardiovascular Disease

## 2020-12-22 ENCOUNTER — Telehealth: Payer: Self-pay | Admitting: *Deleted

## 2020-12-22 ENCOUNTER — Inpatient Hospital Stay: Payer: Medicaid Other | Attending: Physician Assistant | Admitting: Oncology

## 2020-12-22 NOTE — Telephone Encounter (Signed)
PC to patient, no answer, left VM regarding missed appointment today.  Instructed her to call scheduling to reschedule her MD appointment, [phone].

## 2020-12-23 ENCOUNTER — Other Ambulatory Visit: Payer: Self-pay

## 2020-12-23 ENCOUNTER — Ambulatory Visit (INDEPENDENT_AMBULATORY_CARE_PROVIDER_SITE_OTHER): Payer: BC Managed Care – PPO | Admitting: Adult Health

## 2020-12-23 ENCOUNTER — Encounter (INDEPENDENT_AMBULATORY_CARE_PROVIDER_SITE_OTHER): Payer: Self-pay | Admitting: Adult Health

## 2020-12-23 VITALS — BP 135/84 | HR 80 | Temp 97.8°F | Ht 62.0 in | Wt 246.0 lb

## 2020-12-23 DIAGNOSIS — Z6841 Body Mass Index (BMI) 40.0 and over, adult: Secondary | ICD-10-CM

## 2020-12-23 DIAGNOSIS — J45909 Unspecified asthma, uncomplicated: Secondary | ICD-10-CM | POA: Diagnosis not present

## 2020-12-23 DIAGNOSIS — E559 Vitamin D deficiency, unspecified: Secondary | ICD-10-CM

## 2020-12-23 DIAGNOSIS — E8881 Metabolic syndrome: Secondary | ICD-10-CM

## 2020-12-23 DIAGNOSIS — R7982 Elevated C-reactive protein (CRP): Secondary | ICD-10-CM

## 2020-12-23 MED ORDER — METFORMIN HCL 500 MG PO TABS: ORAL_TABLET | ORAL | 0 refills | Status: DC

## 2020-12-23 MED ORDER — ALBUTEROL SULFATE HFA 108 (90 BASE) MCG/ACT IN AERS: 2.0000 | INHALATION_SPRAY | Freq: Four times a day (QID) | RESPIRATORY_TRACT | 0 refills | Status: DC | PRN

## 2020-12-23 MED ORDER — VITAMIN D (ERGOCALCIFEROL) 1.25 MG (50000 UNIT) PO CAPS
50000.0000 [IU] | ORAL_CAPSULE | ORAL | 0 refills | Status: DC
Start: 2020-12-23 — End: 2021-01-19

## 2020-12-23 NOTE — Progress Notes (Signed)
Chief Complaint:   OBESITY Debra Horn is here to discuss her progress with her obesity treatment plan along with follow-up of her obesity related diagnoses. Debra Horn is on the Category 2 Plan and states she is following her eating plan approximately 80% of the time. Debra Horn states she is not exercising regularly at this time.  Today's visit was #: 7 Starting weight: 244 lbs Starting date: 09/15/2020 Today's weight: 246 lbs Today's date: 12/23/2020 Total lbs lost to date: 0 Total lbs lost since last in-office visit: 3 lbs  Interim History:  Debra Horn has been re-scheduled with Hematology twice- she needs to call office to secure another appt. Rheumatology referral was placed, however specialist declined referral until several/specific labs are completed and are found to abnormal. She has requested to move to "split shift" - 8 am-12 pm, 4 pm-8 pm- this new schedule will help her make medical appt's easier to attend. She just joined her local [employer] - plans to start going this week.  Subjective:   1. CRP elevated ESR, CRP all elevated - referred to Rheumatology. Specialist declined to see patient, asked that certain labs be completed and IF ABNORMAL, re-refer.  2. Vitamin D deficiency Vitamin D level on 09/16/2020 - 18.1 - well below goal of 50. She is currently taking prescription ergocalciferol 50,000 IU each week. She denies nausea, vomiting or muscle weakness.  3. Insulin resistance She denies family history of MTC. She denies personal history of pancreatitis. She has never been on medication to lower blood glucose. Family history of T2D in paternal grandmother.   On 11/18/2020, BMP - GFR >60.  4. Asthma, unspecified asthma severity, unspecified whether complicated, unspecified whether persistent Reviewed E-visit notes from 12/02/2020. She continues to experience frequent wheezing.   She denies tobacco/vape use.  Assessment/Plan:   1. CRP elevated Check rheumatoid factor, ANA,  Anti-CCP, SPEP.  - Rheumatoid Factor - ANA - CYCLIC CITRUL PEPTIDE ANTIBODY, IGG/IGA - Protein Electrophoresis, (serum)  2. Vitamin D deficiency Check labs. Refill ergocalciferol 50,000 IU once weekly.  - VITAMIN D 25 Hydroxy (Vit-D Deficiency, Fractures) - Refill Vitamin D, Ergocalciferol, (DRISDOL) 1.25 MG (50000 UNIT) CAPS capsule; Take 1 capsule (50,000 Units total) by mouth every 7 (seven) days.  Dispense: 4 capsule; Refill: 0  3. Insulin resistance Check labs. Start metformin 500 mg 1/2 tablet with breakfast, then increase to 1 full tablet the second week.  - Insulin, random - Hemoglobin A1c - Start metFORMIN (GLUCOPHAGE) 500 MG tablet; 1/2 tab with breakfast for one week, then increase to full tab with breakfast second week- hold at this dose.  Only take once/day.  Dispense: 30 tablet; Refill: 0  4. Asthma, unspecified asthma severity, unspecified whether complicated, unspecified whether persistent Establish with PCP! Refill albuterol 108 mcg 2 puffs Q6H PRN.  No refills.  - Refill albuterol (VENTOLIN HFA) 108 (90 Base) MCG/ACT inhaler; Inhale 2 puffs into the lungs every 6 (six) hours as needed for wheezing or shortness of breath.  Dispense: 8 g; Refill: 0  5. Obesity with current BMI of 45.1  Debra Horn is currently in the action stage of change. As such, her goal is to continue with weight loss efforts. She has agreed to the Category 2 Plan.   Exercise goals: No exercise has been prescribed at this time.  Behavioral modification strategies: increasing lean protein intake, decreasing simple carbohydrates, meal planning and cooking strategies, keeping healthy foods in the home, and planning for success.  Debra Horn has agreed to follow-up with our clinic  in 4 weeks. She was informed of the importance of frequent follow-up visits to maximize her success with intensive lifestyle modifications for her multiple health conditions.   Debra Horn was informed we would discuss her lab  results at her next visit unless there is a critical issue that needs to be addressed sooner. Debra Horn agreed to keep her next visit at the agreed upon time to discuss these results.  Objective:   Blood pressure 135/84, pulse 80, temperature 97.8 F (36.6 C), height 5' 2"  (1.575 m), weight 246 lb (111.6 kg), SpO2 100 %. Body mass index is 44.99 kg/m.  General: Cooperative, alert, well developed, in no acute distress. HEENT: Conjunctivae and lids unremarkable. Cardiovascular: Regular rhythm.  Lungs: Normal work of breathing. Neurologic: No focal deficits.   Lab Results  Component Value Date   CREATININE 0.82 11/18/2020   BUN 16 11/18/2020   NA 140 11/18/2020   K 3.7 11/18/2020   CL 105 11/18/2020   CO2 27 11/18/2020   Lab Results  Component Value Date   ALT 21 08/15/2020   AST 18 08/15/2020   ALKPHOS 104 08/15/2020   BILITOT 0.5 08/15/2020   Lab Results  Component Value Date   HGBA1C 5.4 09/16/2020   HGBA1C 5.0 02/09/2017   HGBA1C 5.6 02/19/2014   Lab Results  Component Value Date   INSULIN 16.1 09/16/2020   Lab Results  Component Value Date   TSH 1.520 09/16/2020   Lab Results  Component Value Date   CHOL 155 09/16/2020   HDL 46 09/16/2020   LDLCALC 97 09/16/2020   TRIG 62 09/16/2020   Lab Results  Component Value Date   VD25OH 18.1 (L) 09/16/2020   VD25OH 8.4 (L) 02/09/2017   Lab Results  Component Value Date   WBC 9.0 11/18/2020   HGB 9.4 (L) 11/18/2020   HCT 31.0 (L) 11/18/2020   MCV 80.7 11/18/2020   PLT 475 (H) 11/18/2020   Attestation Statements:   Reviewed by clinician on day of visit: allergies, medications, problem list, medical history, surgical history, family history, social history, and previous encounter notes.  Time spent on visit including pre-visit chart review and post-visit care and charting was 30 minutes.   I, Water quality scientist, CMA, am acting as Location manager for Mina Marble, NP.  I have reviewed the above documentation for  accuracy and completeness, and I agree with the above. -  Ayden Hardwick d. Lillyian Heidt, NP-C

## 2020-12-24 ENCOUNTER — Encounter (INDEPENDENT_AMBULATORY_CARE_PROVIDER_SITE_OTHER): Payer: Self-pay | Admitting: Adult Health

## 2020-12-24 DIAGNOSIS — E8881 Metabolic syndrome: Secondary | ICD-10-CM

## 2020-12-28 MED ORDER — METFORMIN HCL 500 MG PO TABS: ORAL_TABLET | ORAL | 0 refills | Status: DC

## 2021-01-07 DIAGNOSIS — Z760 Encounter for issue of repeat prescription: Secondary | ICD-10-CM | POA: Diagnosis not present

## 2021-01-07 DIAGNOSIS — J4520 Mild intermittent asthma, uncomplicated: Secondary | ICD-10-CM | POA: Diagnosis not present

## 2021-01-07 NOTE — Progress Notes (Deleted)
Cardiology Office Note:   Date:  01/07/2021  NAME:  Debra Horn    MRN: [id number] DOB:  Jan 14, 1985   PCP:  Pcp, No  Cardiologist:  None  Electrophysiologist:  None   Referring MD: No ref. provider found   No chief complaint on file. ***  History of Present Illness:   Debra Horn is a 36 y.o. female with a hx of obesity, OSA who presents for follow-up of chest pain.  Evaluated for noncardiac chest pain.  Work-up revealed elevated ESR and CRP.  Primary care physician is working up for potential autoimmune disorder.  ESR 96 CRP 26  Problem List Obesity -BMI 45 2. PE -2013 -while on OCP 3. OSA  Past Medical History: Past Medical History:  Diagnosis Date   Anemia    low iron   Asthma    Complication of anesthesia    bp dropped in middle of last c-section and started with n/v   GERD (gastroesophageal reflux disease)    during pregnancy   Headache    "couple days/week" (10/28/2015)   Hyperthyroidism    not on medication   PE (pulmonary embolism) 11/2011   PONV (postoperative nausea and vomiting)    Pregnancy induced hypertension    Preterm labor    Sleep apnea    SOB (shortness of breath)    Thyroid condition    Vitamin D deficiency     Past Surgical History: Past Surgical History:  Procedure Laterality Date   CESAREAN SECTION  2008; 2014   CESAREAN SECTION N/A 07/27/2017   Procedure: REPEAT CESAREAN SECTION;  Surgeon: Donnamae Jude, MD;  Location: [facility];  Service: Obstetrics;  Laterality: N/A;   FRACTURE SURGERY     ORIF ANKLE FRACTURE Right 10/28/2015   ORIF ANKLE FRACTURE Right 10/28/2015   Procedure: OPEN REDUCTION INTERNAL FIXATION (ORIF) RIGHT ANKLE FRACTURE;  Surgeon: Nicholes Stairs, MD;  Location: [facility];  Service: Orthopedics;  Laterality: Right;   TUBAL LIGATION Bilateral 07/27/2017   Procedure: BILATERAL TUBAL LIGATION;  Surgeon: Donnamae Jude, MD;  Location: [facility];  Service: Obstetrics;  Laterality:  Bilateral;    Current Medications: No outpatient medications have been marked as taking for the 01/10/21 encounter (Appointment) with O'Neal, Cassie Freer, MD.     Allergies:    Banana and Cefixime   Social History: Social History   Socioeconomic History   Marital status: Married    Spouse name: Not on file   Number of children: 3   Years of education: 14   Highest education level: Not on file  Occupational History   Occupation: Customer Service  Tobacco Use   Smoking status: Never   Smokeless tobacco: Never  Vaping Use   Vaping Use: Never used  Substance and Sexual Activity   Alcohol use: No   Drug use: No   Sexual activity: Yes    Birth control/protection: None, Surgical  Other Topics Concern   Not on file  Social History Narrative   Born in [place], [place] and raised in [place], [place]. Currently resides in an apartment with her husband and 2 children. No pets. Fun: Work, sleep   Denies religious beliefs that would effect health care.    Social Determinants of Health   Financial Resource Strain: Not on file  Food Insecurity: Not on file  Transportation Needs: Not on file  Physical Activity: Not on file  Stress: Not on file  Social Connections: Not on file  Family History: The patient's ***family history includes Cancer in her maternal grandmother, paternal grandmother, and sister; Diabetes in her father and paternal grandmother; Hyperlipidemia in her paternal grandmother; Hypertension in her father, maternal grandfather, and paternal grandmother; Hypothyroidism in her maternal aunt; Lupus in her mother and another family member; Rheum arthritis in her mother.  ROS:   All other ROS reviewed and negative. Pertinent positives noted in the HPI.     EKGs/Labs/Other Studies Reviewed:   The following studies were personally reviewed by me today:  EKG:  EKG is *** ordered today.  The ekg ordered today demonstrates ***, and was personally reviewed by me.   TTE  12/14/2020   1. Left ventricular ejection fraction, by estimation, is 65 to 70%. Left  ventricular ejection fraction by 3D volume is 66 %. The left ventricle has  normal function. The left ventricle has no regional wall motion  abnormalities. Left ventricular diastolic   parameters were normal. The average left ventricular global longitudinal  strain is -26.6 %. The global longitudinal strain is normal.   2. Right ventricular systolic function is normal. The right ventricular  size is normal.   3. The mitral valve is grossly normal. Trivial mitral valve  regurgitation. No evidence of mitral stenosis.   4. The aortic valve is normal in structure. Aortic valve regurgitation is  not visualized. No aortic stenosis is present.   5. The inferior vena cava is normal in size with greater than 50%  respiratory variability, suggesting right atrial pressure of 3 mmHg.   ETT 11/24/2020 The patient exercised 3:9mn according to the CVN-BRUCE protocol achieving 5.1METs consistent with poor exercise capacity   Her resting heart rate was 86bpm that rose to a maximal heart rate of 171bpm which was 92% of maximal, age predicted heart rate.   No ST deviation was noted during stress test.   There was no electrocardiographic evidence of ischemia, however, patient was only able to exercise for 3:29seconds consistent with poor exercise capacity. Consider further work-up with imaging if clinically indicated.  Recent Labs: 08/15/2020: ALT 21 09/16/2020: TSH 1.520 11/18/2020: BUN 16; Creatinine, Ser 0.82; Hemoglobin 9.4; Platelets 475; Potassium 3.7; Sodium 140   Recent Lipid Panel    Component Value Date/Time   CHOL 155 09/16/2020 0735   TRIG 62 09/16/2020 0735   HDL 46 09/16/2020 0735   LDLCALC 97 09/16/2020 0735    Physical Exam:   VS:  There were no vitals taken for this visit.   Wt Readings from Last 3 Encounters:  12/23/20 246 lb (111.6 kg)  12/01/20 249 lb (112.9 kg)  11/18/20 252 lb (114.3 kg)     General: Well nourished, well developed, in no acute distress Head: Atraumatic, normal size  Eyes: PEERLA, EOMI  Neck: Supple, no JVD Endocrine: No thryomegaly Cardiac: Normal S1, S2; RRR; no murmurs, rubs, or gallops Lungs: Clear to auscultation bilaterally, no wheezing, rhonchi or rales  Abd: Soft, nontender, no hepatomegaly  Ext: No edema, pulses 2+ Musculoskeletal: No deformities, BUE and BLE strength normal and equal Skin: Warm and dry, no rashes   Neuro: Alert and oriented to person, place, time, and situation, CNII-XII grossly intact, no focal deficits  Psych: Normal mood and affect   ASSESSMENT:   LADELE MILSONis a 36y.o. female who presents for the following: No diagnosis found.  PLAN:   There are no diagnoses linked to this encounter.  {Are you ordering a CV Procedure (e.g. stress test, cath, DCCV, TEE, etc)?  Press F2        :[id number]}  Disposition: No follow-ups on file.  Medication Adjustments/Labs and Tests Ordered: Current medicines are reviewed at length with the patient today.  Concerns regarding medicines are outlined above.  No orders of the defined types were placed in this encounter.  No orders of the defined types were placed in this encounter.   There are no Patient Instructions on file for this visit.   Time Spent with Patient: I have spent a total of *** minutes with patient reviewing hospital notes, telemetry, EKGs, labs and examining the patient as well as establishing an assessment and plan that was discussed with the patient.  > 50% of time was spent in direct patient care.  Signed, Addison Naegeli. Audie Box, MD, [place]  [address] [place], [place] [address] [phone]  01/07/2021 5:35 PM

## 2021-01-10 ENCOUNTER — Ambulatory Visit: Payer: Medicaid Other | Admitting: Cardiovascular Disease

## 2021-01-10 DIAGNOSIS — R072 Precordial pain: Secondary | ICD-10-CM

## 2021-01-14 ENCOUNTER — Other Ambulatory Visit (INDEPENDENT_AMBULATORY_CARE_PROVIDER_SITE_OTHER): Payer: Self-pay | Admitting: Adult Health

## 2021-01-14 DIAGNOSIS — J3489 Other specified disorders of nose and nasal sinuses: Secondary | ICD-10-CM

## 2021-01-17 ENCOUNTER — Telehealth: Payer: Medicaid Other | Admitting: Nurse Practitioner

## 2021-01-17 DIAGNOSIS — N760 Acute vaginitis: Secondary | ICD-10-CM | POA: Diagnosis not present

## 2021-01-17 MED ORDER — METRONIDAZOLE 500 MG PO TABS: 500.0000 mg | ORAL_TABLET | Freq: Two times a day (BID) | ORAL | 0 refills | Status: AC

## 2021-01-17 NOTE — Progress Notes (Signed)
E-Visit for Vaginal Symptoms  We are sorry that you are not feeling well. Here is how we plan to help! Based on what you shared with me it looks like you: May have a vaginosis due to bacteria  Vaginosis is an inflammation of the vagina that can result in discharge, itching and pain. The cause is usually a change in the normal balance of vaginal bacteria or an infection. Vaginosis can also result from reduced estrogen levels after menopause.  The most common causes of vaginosis are:   Bacterial vaginosis which results from an overgrowth of one on several organisms that are normally present in your vagina.   Yeast infections which are caused by a naturally occurring fungus called candida.   Vaginal atrophy (atrophic vaginosis) which results from the thinning of the vagina from reduced estrogen levels after menopause.   Trichomoniasis which is caused by a parasite and is commonly transmitted by sexual intercourse.  Factors that increase your risk of developing vaginosis include: Medications, such as antibiotics and steroids Uncontrolled diabetes Use of hygiene products such as bubble bath, vaginal spray or vaginal deodorant Douching Wearing damp or tight-fitting clothing Using an intrauterine device (IUD) for birth control Hormonal changes, such as those associated with pregnancy, birth control pills or menopause Sexual activity Having a sexually transmitted infection  Your treatment plan is Metronidazole or Flagyl 500mg  twice a day for 7 days.  I have electronically sent this prescription into the pharmacy that you have chosen.  If you continue to experience urinary symptoms please schedule a follow up appointment. This antibiotic is for your vaginal symptoms which may be causing some changes in your urinating patterns, but will not treat a UTI.   Be sure to take all of the medication as directed. Stop taking any medication if you develop a rash, tongue swelling or shortness of breath.  Mothers who are breast feeding should consider pumping and discarding their breast milk while on these antibiotics. However, there is no consensus that infant exposure at these doses would be harmful.  Remember that medication creams can weaken latex condoms.   HOME CARE:  Good hygiene may prevent some types of vaginosis from recurring and may relieve some symptoms:  Avoid baths, hot tubs and whirlpool spas. Rinse soap from your outer genital area after a shower, and dry the area well to prevent irritation. Don't use scented or harsh soaps, such as those with deodorant or antibacterial action. Avoid irritants. These include scented tampons and pads. Wipe from front to back after using the toilet. Doing so avoids spreading fecal bacteria to your vagina.  Other things that may help prevent vaginosis include:  Don't douche. Your vagina doesn't require cleansing other than normal bathing. Repetitive douching disrupts the normal organisms that reside in the vagina and can actually increase your risk of vaginal infection. Douching won't clear up a vaginal infection. Use a latex condom. Both female and female latex condoms may help you avoid infections spread by sexual contact. Wear cotton underwear. Also wear pantyhose with a cotton crotch. If you feel comfortable without it, skip wearing underwear to bed. Yeast thrives in [place] Your symptoms should improve in the next day or two.  GET HELP RIGHT AWAY IF:  You have pain in your lower abdomen ( pelvic area or over your ovaries) You develop nausea or vomiting You develop a fever Your discharge changes or worsens You have persistent pain with intercourse You develop shortness of breath, a rapid pulse, or you  faint.  These symptoms could be signs of problems or infections that need to be evaluated by a medical provider now.  MAKE SURE YOU   Understand these instructions. Will watch your condition. Will get help right away if you  are not doing well or get worse.  Thank you for choosing an e-visit.  Your e-visit answers were reviewed by a board certified advanced clinical practitioner to complete your personal care plan. Depending upon the condition, your plan could have included both over the counter or prescription medications.  Please review your pharmacy choice. Make sure the pharmacy is open so you can pick up prescription now. If there is a problem, you may contact your provider through [employer] and have the prescription routed to another pharmacy.  Your safety is important to [place]. If you have drug allergies check your prescription carefully.   For the next 24 hours you can use MyChart to ask questions about today's visit, request a non-urgent call back, or ask for a work or school excuse. You will get an email in the next two days asking about your experience. I hope that your e-visit has been valuable and will speed your recovery.   I spent approximately 5 minutes reviewing the patient's history, current symptoms and coordinating their plan of care today.    Meds ordered this encounter  Medications   metroNIDAZOLE (FLAGYL) 500 MG tablet    Sig: Take 1 tablet (500 mg total) by mouth 2 (two) times daily for 7 days.    Dispense:  14 tablet    Refill:  0

## 2021-01-19 ENCOUNTER — Encounter (INDEPENDENT_AMBULATORY_CARE_PROVIDER_SITE_OTHER): Payer: Self-pay | Admitting: Adult Health

## 2021-01-19 ENCOUNTER — Ambulatory Visit (INDEPENDENT_AMBULATORY_CARE_PROVIDER_SITE_OTHER): Payer: BC Managed Care – PPO | Admitting: Adult Health

## 2021-01-19 ENCOUNTER — Other Ambulatory Visit: Payer: Self-pay

## 2021-01-19 ENCOUNTER — Other Ambulatory Visit (INDEPENDENT_AMBULATORY_CARE_PROVIDER_SITE_OTHER): Payer: Self-pay | Admitting: Adult Health

## 2021-01-19 ENCOUNTER — Ambulatory Visit
Admission: RE | Admit: 2021-01-19 | Discharge: 2021-01-19 | Disposition: A | Discharge status: Home / Self Care | Payer: BC Managed Care – PPO | Source: Ambulatory Visit | Attending: Internal Medicine | Admitting: Internal Medicine

## 2021-01-19 VITALS — BP 108/77 | HR 85 | Temp 98.5°F | Resp 18

## 2021-01-19 VITALS — BP 124/76 | HR 89 | Temp 98.9°F | Ht 62.0 in | Wt 243.0 lb

## 2021-01-19 DIAGNOSIS — E559 Vitamin D deficiency, unspecified: Secondary | ICD-10-CM | POA: Diagnosis not present

## 2021-01-19 DIAGNOSIS — T63301A Toxic effect of unspecified spider venom, accidental (unintentional), initial encounter: Secondary | ICD-10-CM | POA: Diagnosis not present

## 2021-01-19 DIAGNOSIS — Z6841 Body Mass Index (BMI) 40.0 and over, adult: Secondary | ICD-10-CM

## 2021-01-19 DIAGNOSIS — L02811 Cutaneous abscess of head [any part, except face]: Secondary | ICD-10-CM | POA: Diagnosis not present

## 2021-01-19 DIAGNOSIS — E8881 Metabolic syndrome: Secondary | ICD-10-CM

## 2021-01-19 MED ORDER — VITAMIN D (ERGOCALCIFEROL) 1.25 MG (50000 UNIT) PO CAPS: 50000.0000 [IU] | ORAL_CAPSULE | ORAL | 0 refills | Status: DC

## 2021-01-19 MED ORDER — METFORMIN HCL 500 MG PO TABS: ORAL_TABLET | ORAL | 0 refills | Status: DC

## 2021-01-19 MED ORDER — TRIAMCINOLONE ACETONIDE 0.1 % EX CREA
1.0000 | TOPICAL_CREAM | Freq: Two times a day (BID) | CUTANEOUS | 0 refills | Status: DC
Start: 2021-01-19 — End: 2021-02-09

## 2021-01-19 MED ORDER — DOXYCYCLINE HYCLATE 100 MG PO CAPS: 100.0000 mg | ORAL_CAPSULE | Freq: Two times a day (BID) | ORAL | 0 refills | Status: DC

## 2021-01-19 NOTE — Discharge Instructions (Addendum)
You have been prescribed antibiotic and a cream to help alleviate inflammation and possible infection.  Please also use warm compresses to affected area.  Please go to the hospital or follow-up if symptoms worsen or do not improve.

## 2021-01-19 NOTE — ED Triage Notes (Signed)
Pt presents today suspicious of a spider bite in the back of her head after helping clean out a shed approx 2 weeks ago. Pain has increased over the last 3-4 days. Lesion is hard and non pruritic. Has been using warm compresses w/o relief. Pain radiates down axial neck and into her left trap. Has been taking tylenol w/o relief. No drainage.

## 2021-01-19 NOTE — ED Provider Notes (Signed)
[facility]    CSN: [id number] Arrival date & time: 01/19/21  0853      History   Chief Complaint Chief Complaint  Patient presents with   9a appt   Insect Bite    HPI Debra Horn is a 36 y.o. female.   Patient presents with possible spider bite to the back of her head that has been present for approximately 2 weeks.  She reports that she was working in a shed when she felt like something bit her, so she brushed it off but was not able to see what it was.  Denies any other injury to the head.  Patient denies nausea, vomiting, dizziness, blurred vision, muscle aches.  Denies any known fevers.  Denies any drainage from the area.  Patient was recently prescribed metronidazole for possible bacterial vaginosis but reports that she has not started taking this medication yet.    Past Medical History:  Diagnosis Date   Anemia    low iron   Asthma    Complication of anesthesia    bp dropped in middle of last c-section and started with n/v   GERD (gastroesophageal reflux disease)    during pregnancy   Headache    "couple days/week" (10/28/2015)   Hyperthyroidism    not on medication   PE (pulmonary embolism) 11/2011   PONV (postoperative nausea and vomiting)    Pregnancy induced hypertension    Preterm labor    Sleep apnea    SOB (shortness of breath)    Thyroid condition    Vitamin D deficiency     Patient Active Problem List   Diagnosis Date Noted   CRP elevated 12/01/2020   ESR raised 12/01/2020   Nasal drainage 11/09/2020   Insulin resistance 10/26/2020   Other chest pain 10/13/2020   Mild persistent asthma 06/24/2020   Severe sleep apnea 06/24/2020   BMI 45.0-49.9, adult ([place]) 06/24/2020   Status post repeat low transverse cesarean section 07/27/2017   Poor fetal growth affecting management of mother in third trimester    Previous pregnancy complicated by pregnancy-induced hypertension in third trimester, antepartum    Unwanted fertility  06/06/2017   IUGR (intrauterine growth restriction) in prior pregnancy, pregnant 03/06/2017   Vitamin D deficiency 02/17/2017   History of preterm delivery 02/09/2017   History of asthma    Normocytic anemia 02/06/2016   Family history of systemic lupus erythematosus (SLE) in mother 02/19/2014   Hyperthyroidism 02/19/2014   Previous cesarean section 08/08/2012   Hx pulmonary embolism 07/24/2012    Past Surgical History:  Procedure Laterality Date   CESAREAN SECTION  2008; 2014   CESAREAN SECTION N/A 07/27/2017   Procedure: REPEAT CESAREAN SECTION;  Surgeon: Donnamae Jude, MD;  Location: [facility];  Service: Obstetrics;  Laterality: N/A;   FRACTURE SURGERY     ORIF ANKLE FRACTURE Right 10/28/2015   ORIF ANKLE FRACTURE Right 10/28/2015   Procedure: OPEN REDUCTION INTERNAL FIXATION (ORIF) RIGHT ANKLE FRACTURE;  Surgeon: Nicholes Stairs, MD;  Location: [facility];  Service: Orthopedics;  Laterality: Right;   TUBAL LIGATION Bilateral 07/27/2017   Procedure: BILATERAL TUBAL LIGATION;  Surgeon: Donnamae Jude, MD;  Location: [facility];  Service: Obstetrics;  Laterality: Bilateral;    OB History     Gravida  3   Para  3   Term  1   Preterm  2   AB      Living  3      SAB  IAB      Ectopic      Multiple  0   Live Births  3            Home Medications    Prior to Admission medications   Medication Sig Start Date End Date Taking? Authorizing Provider  doxycycline (VIBRAMYCIN) 100 MG capsule Take 1 capsule (100 mg total) by mouth 2 (two) times daily. 01/19/21  Yes Neve Branscomb, Hildred Alamin E, FNP  triamcinolone cream (KENALOG) 0.1 % Apply 1 application topically 2 (two) times daily. 01/19/21  Yes Topanga Alvelo, Hildred Alamin E, FNP  acetaminophen (TYLENOL) 650 MG CR tablet Take 650 mg by mouth every 8 (eight) hours as needed for pain.    [provider]  albuterol (VENTOLIN HFA) 108 (90 Base) MCG/ACT inhaler Inhale 2 puffs into the lungs every 6 (six) hours as  needed for wheezing or shortness of breath. 12/23/20   Danford, Valetta Fuller D, NP  fluticasone (FLONASE) 50 MCG/ACT nasal spray SPRAY 2 SPRAYS INTO EACH NOSTRIL EVERY DAY 12/13/20   Danford, Valetta Fuller D, NP  fluticasone (FLOVENT HFA) 110 MCG/ACT inhaler Inhale 2 puffs into the lungs in the morning and at bedtime. 06/24/20   Martyn Ehrich, NP  ibuprofen (ADVIL) 800 MG tablet TAKE 1 TABLET BY MOUTH THREE TIMES A DAY 12/02/20   O'Neal, Cassie Freer, MD  meclizine (ANTIVERT) 25 MG tablet Take 1 tablet (25 mg total) by mouth 3 (three) times daily as needed for dizziness. 11/18/20   Isla Pence, MD  metFORMIN (GLUCOPHAGE) 500 MG tablet 1/2 tab with breakfast for one week, then increase to full tab with breakfast second week- hold at this dose. 12/28/20   Danford, Valetta Fuller D, NP  metroNIDAZOLE (FLAGYL) 500 MG tablet Take 1 tablet (500 mg total) by mouth 2 (two) times daily for 7 days. 01/17/21 01/24/21  Apolonio Schneiders, FNP  ondansetron (ZOFRAN ODT) 4 MG disintegrating tablet Take 1 tablet (4 mg total) by mouth every 8 (eight) hours as needed for nausea or vomiting. 11/18/20   Isla Pence, MD  Vitamin D, Ergocalciferol, (DRISDOL) 1.25 MG (50000 UNIT) CAPS capsule Take 1 capsule (50,000 Units total) by mouth every 7 (seven) days. 12/23/20   Mina Marble D, NP    Family History Family History  Problem Relation Age of Onset   Hypertension Maternal Grandfather    Hypertension Paternal Grandmother    Cancer Paternal Grandmother    Hyperlipidemia Paternal Grandmother    Diabetes Paternal Grandmother    Hypertension Father    Diabetes Father    Cancer Sister        skin   Cancer Maternal Grandmother        leukemia   Rheum arthritis Mother    Lupus Mother    Hypothyroidism Maternal Aunt    Lupus Other     Social History Social History   Tobacco Use   Smoking status: Never   Smokeless tobacco: Never  Vaping Use   Vaping Use: Never used  Substance Use Topics   Alcohol use: No   Drug use: No      Allergies   Banana and Cefixime   Review of Systems Review of Systems Per HPI  Physical Exam Triage Vital Signs ED Triage Vitals  Enc Vitals Group     BP 01/19/21 0918 108/77     Pulse Rate 01/19/21 0918 85     Resp 01/19/21 0918 18     Temp 01/19/21 0918 98.5 F (36.9 C)     Temp Source 01/19/21  0962 Oral     SpO2 01/19/21 0918 96 %     Weight --      Height --      Head Circumference --      Peak Flow --      Pain Score 01/19/21 0922 8     Pain Loc --      Pain Edu? --      Excl. in [place]? --    No data found.  Updated Vital Signs BP 108/77 (BP Location: Left Arm)    Pulse 85    Temp 98.5 F (36.9 C) (Oral)    Resp 18    SpO2 96%   Visual Acuity Right Eye Distance:   Left Eye Distance:   Bilateral Distance:    Right Eye Near:   Left Eye Near:    Bilateral Near:     Physical Exam Constitutional:      General: She is not in acute distress.    Appearance: Normal appearance. She is not toxic-appearing or diaphoretic.  HENT:     Head: Normocephalic and atraumatic.  Eyes:     Extraocular Movements: Extraocular movements intact.     Conjunctiva/sclera: Conjunctivae normal.  Cardiovascular:     Rate and Rhythm: Normal rate and regular rhythm.     Pulses: Normal pulses.     Heart sounds: Normal heart sounds.  Pulmonary:     Effort: Pulmonary effort is normal. No respiratory distress.     Breath sounds: Normal breath sounds.  Skin:    Comments: Approximately 2.5 cm raised abscess-like lesion present to posterior head.  Full range of motion of neck.  No drainage noted from lesion.  Neurological:     General: No focal deficit present.     Mental Status: She is alert and oriented to person, place, and time. Mental status is at baseline.     Cranial Nerves: Cranial nerves 2-12 are intact.     Sensory: Sensation is intact.     Motor: Motor function is intact.     Coordination: Coordination is intact.     Gait: Gait is intact.  Psychiatric:        Mood and  Affect: Mood normal.        Behavior: Behavior normal.        Thought Content: Thought content normal.        Judgment: Judgment normal.     UC Treatments / Results  Labs (all labs ordered are listed, but only abnormal results are displayed) Labs Reviewed - No data to display  EKG   Radiology No results found.  Procedures Procedures (including critical care time)  Medications Ordered in UC Medications - No data to display  Initial Impression / Assessment and Plan / UC Course  I have reviewed the triage vital signs and the nursing notes.  Pertinent labs & imaging results that were available during my care of the patient were reviewed by me and considered in my medical decision making (see chart for details).     Differential diagnoses include insect bite, spider bite, abscess.  Will treat with triamcinolone cream and doxycycline antibiotic.  Patient also to use warm compresses.  No signs of systemic complications but patient was advised to monitor very closely and to follow-up with urgent care or hospital if symptoms persist or worsen.  Patient verbalized understanding and was agreeable with plan. Final Clinical Impressions(s) / UC Diagnoses   Final diagnoses:  Abscess of head  Spider bite wound, accidental or  unintentional, initial encounter     Discharge Instructions      You have been prescribed antibiotic and a cream to help alleviate inflammation and possible infection.  Please also use warm compresses to affected area.  Please go to the hospital or follow-up if symptoms worsen or do not improve.    ED Prescriptions     Medication Sig Dispense Auth. Provider   doxycycline (VIBRAMYCIN) 100 MG capsule Take 1 capsule (100 mg total) by mouth 2 (two) times daily. 20 capsule [place], [place] E, [place]   triamcinolone cream (KENALOG) 0.1 % Apply 1 application topically 2 (two) times daily. 30 g Teodora Medici, [place]      PDMP not reviewed this encounter.   Teodora Medici,  [place] 01/19/21 [id number]

## 2021-01-20 NOTE — Progress Notes (Signed)
Chief Complaint:   OBESITY Debra Horn is here to discuss her progress with her obesity treatment plan along with follow-up of her obesity related diagnoses. Debra Horn is on the Category 2 Plan and states she is following her eating plan approximately 80% of the time. Debra Horn states she is not exercising regularly.  Today's visit was #: 8 Starting weight: 244 lbs Starting date: 09/15/2020 Today's weight: 243 lbs Today's date: 01/19/2021 Total lbs lost to date: 1 lb Total lbs lost since last in-office visit: 3 lbs  Interim History:  She has not completed panel of labs ordered at last OV. She reports little to no water intake today and is worried that the [employer] tech will be unable to obtain specimen today. She states "I will go to [employer] office tomorrow to have them labs taken" tomorrow.  She has titrated up to full tablet of metformin 500 mg with dinner- tolerating well.  2023 health/wellness goals:   1) Lose weight.  2) Start taking a daily OTC Multivitamin.  Subjective:   1. Insulin resistance She was started on metformin at last office visit - has titrated up to 500 mg tablet at dinner.  She experiences some nausea without vomiting the first week.  Now tolerating well.  2. Vitamin D deficiency Vitamin D level on 09/16/2020 - 18.1 - well below goal of 50. She is currently taking prescription ergocalciferol 50,000 IU each week. She denies nausea, vomiting or muscle weakness.  Assessment/Plan:   1. Insulin resistance Refill metformin 500 mg with dinner.  - Refill metFORMIN (GLUCOPHAGE) 500 MG tablet; 1 tab at dinner  Dispense: 30 tablet; Refill: 0  2. Vitamin D deficiency Refill ergocalciferol 50,000 IU once weekly.  - Refill Vitamin D, Ergocalciferol, (DRISDOL) 1.25 MG (50000 UNIT) CAPS capsule; Take 1 capsule (50,000 Units total) by mouth every 7 (seven) days.  Dispense: 4 capsule; Refill: 0  3. Obesity with current BMI of 44.6  Debra Horn is currently in the action  stage of change. As such, her goal is to continue with weight loss efforts. She has agreed to the Category 2 Plan.   1)  Daily OTC multivitamin.  2)  Complete labs.  Exercise goals: No exercise has been prescribed at this time.  Behavioral modification strategies: increasing lean protein intake, decreasing simple carbohydrates, meal planning and cooking strategies, keeping healthy foods in the home, and planning for success.  Debra Horn has agreed to follow-up with our clinic in 3 weeks. She was informed of the importance of frequent follow-up visits to maximize her success with intensive lifestyle modifications for her multiple health conditions.   Objective:   Blood pressure 124/76, pulse 89, temperature 98.9 F (37.2 C), height 5\' 2"  (1.575 m), weight 243 lb (110.2 kg), SpO2 99 %. Body mass index is 44.45 kg/m.  General: Cooperative, alert, well developed, in no acute distress. HEENT: Conjunctivae and lids unremarkable. Cardiovascular: Regular rhythm.  Lungs: Normal work of breathing. Neurologic: No focal deficits.   Lab Results  Component Value Date   CREATININE 0.82 11/18/2020   BUN 16 11/18/2020   NA 140 11/18/2020   K 3.7 11/18/2020   CL 105 11/18/2020   CO2 27 11/18/2020   Lab Results  Component Value Date   ALT 21 08/15/2020   AST 18 08/15/2020   ALKPHOS 104 08/15/2020   BILITOT 0.5 08/15/2020   Lab Results  Component Value Date   HGBA1C 5.4 09/16/2020   HGBA1C 5.0 02/09/2017   HGBA1C 5.6 02/19/2014  Lab Results  Component Value Date   INSULIN 16.1 09/16/2020   Lab Results  Component Value Date   TSH 1.520 09/16/2020   Lab Results  Component Value Date   CHOL 155 09/16/2020   HDL 46 09/16/2020   LDLCALC 97 09/16/2020   TRIG 62 09/16/2020   Lab Results  Component Value Date   VD25OH 18.1 (L) 09/16/2020   VD25OH 8.4 (L) 02/09/2017   Lab Results  Component Value Date   WBC 9.0 11/18/2020   HGB 9.4 (L) 11/18/2020   HCT 31.0 (L) 11/18/2020    MCV 80.7 11/18/2020   PLT 475 (H) 11/18/2020   Attestation Statements:   Reviewed by clinician on day of visit: allergies, medications, problem list, medical history, surgical history, family history, social history, and previous encounter notes.  Time spent on visit including pre-visit chart review and post-visit care and charting was 28 minutes.   I, Water quality scientist, CMA, am acting as Location manager for Mina Marble, NP.  I have reviewed the above documentation for accuracy and completeness, and I agree with the above. -  Roxy Mastandrea d. Menno Vanbergen, NP-C

## 2021-02-04 DIAGNOSIS — R7982 Elevated C-reactive protein (CRP): Secondary | ICD-10-CM | POA: Diagnosis not present

## 2021-02-04 DIAGNOSIS — E8881 Metabolic syndrome: Secondary | ICD-10-CM | POA: Diagnosis not present

## 2021-02-04 DIAGNOSIS — E559 Vitamin D deficiency, unspecified: Secondary | ICD-10-CM | POA: Diagnosis not present

## 2021-02-07 LAB — PROTEIN ELECTROPHORESIS, SERUM
A/G Ratio: 0.8 (ref 0.7–1.7)
Albumin ELP: 3.3 g/dL (ref 2.9–4.4)
Alpha 1: 0.3 g/dL (ref 0.0–0.4)
Alpha 2: 0.9 g/dL (ref 0.4–1.0)
Beta: 1.4 g/dL — ABNORMAL HIGH (ref 0.7–1.3)
Gamma Globulin: 1.7 g/dL (ref 0.4–1.8)
Globulin, Total: 4.2 g/dL — ABNORMAL HIGH (ref 2.2–3.9)
Total Protein: 7.5 g/dL (ref 6.0–8.5)

## 2021-02-08 LAB — CYCLIC CITRUL PEPTIDE ANTIBODY, IGG/IGA: Cyclic Citrullin Peptide Ab: 7 units (ref 0–19)

## 2021-02-09 ENCOUNTER — Encounter (INDEPENDENT_AMBULATORY_CARE_PROVIDER_SITE_OTHER): Payer: Self-pay | Admitting: Adult Health

## 2021-02-09 ENCOUNTER — Ambulatory Visit (INDEPENDENT_AMBULATORY_CARE_PROVIDER_SITE_OTHER): Payer: BC Managed Care – PPO | Admitting: Adult Health

## 2021-02-09 ENCOUNTER — Other Ambulatory Visit: Payer: Self-pay

## 2021-02-09 VITALS — BP 128/82 | HR 82 | Temp 97.9°F | Ht 62.0 in | Wt 246.0 lb

## 2021-02-09 DIAGNOSIS — E8881 Metabolic syndrome: Secondary | ICD-10-CM | POA: Diagnosis not present

## 2021-02-09 DIAGNOSIS — E66813 Obesity, class 3: Secondary | ICD-10-CM

## 2021-02-09 DIAGNOSIS — Z6841 Body Mass Index (BMI) 40.0 and over, adult: Secondary | ICD-10-CM

## 2021-02-09 DIAGNOSIS — E669 Obesity, unspecified: Secondary | ICD-10-CM

## 2021-02-09 DIAGNOSIS — D649 Anemia, unspecified: Secondary | ICD-10-CM | POA: Diagnosis not present

## 2021-02-09 DIAGNOSIS — R7982 Elevated C-reactive protein (CRP): Secondary | ICD-10-CM

## 2021-02-09 DIAGNOSIS — Z9189 Other specified personal risk factors, not elsewhere classified: Secondary | ICD-10-CM

## 2021-02-09 DIAGNOSIS — E88819 Insulin resistance, unspecified: Secondary | ICD-10-CM

## 2021-02-09 DIAGNOSIS — E559 Vitamin D deficiency, unspecified: Secondary | ICD-10-CM

## 2021-02-09 MED ORDER — SEMAGLUTIDE-WEIGHT MANAGEMENT 0.25 MG/0.5ML ~~LOC~~ SOAJ: 0.2500 mg | SUBCUTANEOUS | 0 refills | Status: DC

## 2021-02-09 MED ORDER — VITAMIN D (ERGOCALCIFEROL) 1.25 MG (50000 UNIT) PO CAPS: 50000.0000 [IU] | ORAL_CAPSULE | ORAL | 0 refills | Status: DC

## 2021-02-10 NOTE — Progress Notes (Signed)
Chief Complaint:   OBESITY Debra Horn is here to discuss her progress with her obesity treatment plan along with follow-up of her obesity related diagnoses. Debra Horn is on the Category 2 Plan and states she is following her eating plan approximately 80% of the time. [name] states she is walking for 30 minutes 3 times per week.  Today's visit was #: 9 Starting weight: 244 lbs Starting date: 09/15/2020 Today's weight: 246 lbs Today's date: 02/09/2021 Total lbs lost to date: 0 Total lbs lost since last in-office visit: 0  Interim History:  Debra Horn has yet to be seen by Hematology.  New referral placed today. Typical day of intake- Breakfast - 4 eggs, bacon. Lunch - salad - only vegetables. Dinner - 4-6 ounces meat protein with vegetables.  Subjective:   1. CRP elevated Discussed labs with patient today.  Labs requested by Rheumatology completed- essentially normal.  She "thinks" her mother may have RA.  She had work-up for RA years ago, per pt work-up was negative.  2. Vitamin D deficiency Discussed labs with patient today.  On 02/04/2021, vitamin D level - 48.9 - stable. She is currently taking prescription ergocalciferol 50,000 IU each week. She denies nausea, vomiting or muscle weakness.  3. Insulin resistance Discussed labs with patient today.  On 02/04/2021, A1c 5.6, insulin level 17.6. On 11/18/2020, CMP - GFR >60. She is on metformin 500 mg - takes at night due to nausea. She denies family hx of MTC or personal hx of pancreatitis.  4. Normocytic anemia 11/18/2020 CBC  Decreased RBC, H/H, MCH Elevated RDW, Platelets. She has been unable to schedule initial pt appt with Hematology.  5. At risk for constipation Debra Horn is at increased risk for constipation due to changing from metformin to Ozempic.  Assessment/Plan:   1. CRP elevated Monitor labs as appropriate.  2. Vitamin D deficiency Refill ergocalciferol 50,000 IU once weekly.  - Refill Vitamin D,  Ergocalciferol, (DRISDOL) 1.25 MG (50000 UNIT) CAPS capsule; Take 1 capsule (50,000 Units total) by mouth every 7 (seven) days.  Dispense: 4 capsule; Refill: 0  3. Insulin resistance Start Wegovy 0.25 mg once weekly. Once you start Wegovy, stop metformin.  - Start Semaglutide-Weight Management 0.25 MG/0.5ML SOAJ; Inject 0.25 mg into the skin once a week.  Dispense: 2 mL; Refill: 0  4. Normocytic anemia Second referral to Hematology placed.  - Ambulatory referral to Hematology / Oncology  5. At risk for constipation [name] was given approximately 15 minutes of counseling today regarding prevention of constipation. She was encouraged to increase water and fiber intake.   6. Obesity with current BMI of 45.0  Debra Horn is currently in the action stage of change. As such, her goal is to continue with weight loss efforts. She has agreed to the Category 2 Plan.   Start Wegovy 0.25 mg once weekly, then stop metformin.  Increase protein intake.  ESTABLISH WITH PCP.  Exercise goals:  As is.  Behavioral modification strategies: increasing lean protein intake, decreasing simple carbohydrates, meal planning and cooking strategies, keeping healthy foods in the home, and planning for success.  Debra Horn has agreed to follow-up with our clinic in 3 weeks. She was informed of the importance of frequent follow-up visits to maximize her success with intensive lifestyle modifications for her multiple health conditions.   Objective:   Blood pressure 128/82, pulse 82, temperature 97.9 F (36.6 C), height 5\' 2"  (1.575 m), weight 246 lb (111.6 kg), SpO2 98 %. Body mass index is 44.99 kg/m.  General: Cooperative, alert, well developed, in no acute distress. HEENT: Conjunctivae and lids unremarkable. Cardiovascular: Regular rhythm.  Lungs: Normal work of breathing. Neurologic: No focal deficits.   Lab Results  Component Value Date   CREATININE 0.82 11/18/2020   BUN 16 11/18/2020   NA 140 11/18/2020    K 3.7 11/18/2020   CL 105 11/18/2020   CO2 27 11/18/2020   Lab Results  Component Value Date   ALT 21 08/15/2020   AST 18 08/15/2020   ALKPHOS 104 08/15/2020   BILITOT 0.5 08/15/2020   Lab Results  Component Value Date   HGBA1C 5.6 02/04/2021   HGBA1C 5.4 09/16/2020   HGBA1C 5.0 02/09/2017   HGBA1C 5.6 02/19/2014   Lab Results  Component Value Date   INSULIN 17.6 02/04/2021   INSULIN 16.1 09/16/2020   Lab Results  Component Value Date   TSH 1.520 09/16/2020   Lab Results  Component Value Date   CHOL 155 09/16/2020   HDL 46 09/16/2020   LDLCALC 97 09/16/2020   TRIG 62 09/16/2020   Lab Results  Component Value Date   VD25OH 48.9 02/04/2021   VD25OH 18.1 (L) 09/16/2020   VD25OH 8.4 (L) 02/09/2017   Lab Results  Component Value Date   WBC 9.0 11/18/2020   HGB 9.4 (L) 11/18/2020   HCT 31.0 (L) 11/18/2020   MCV 80.7 11/18/2020   PLT 475 (H) 11/18/2020   Attestation Statements:   Reviewed by clinician on day of visit: allergies, medications, problem list, medical history, surgical history, family history, social history, and previous encounter notes.  I, Water quality scientist, CMA, am acting as Location manager for Mina Marble, NP.  I have reviewed the above documentation for accuracy and completeness, and I agree with the above. -Maecie Sevcik d. Attie Nawabi, NP-C

## 2021-02-11 ENCOUNTER — Telehealth: Payer: Self-pay | Admitting: Internal Medicine

## 2021-02-11 LAB — INSULIN, RANDOM: INSULIN: 17.6 u[IU]/mL (ref 2.6–24.9)

## 2021-02-11 LAB — HEMOGLOBIN A1C
Est. average glucose Bld gHb Est-mCnc: 114 mg/dL
Hgb A1c MFr Bld: 5.6 % (ref 4.8–5.6)

## 2021-02-11 LAB — VITAMIN D 25 HYDROXY (VIT D DEFICIENCY, FRACTURES): Vit D, 25-Hydroxy: 48.9 ng/mL (ref 30.0–100.0)

## 2021-02-11 LAB — ANA: ANA Titer 1: NEGATIVE

## 2021-02-11 LAB — RHEUMATOID FACTOR: Rheumatoid fact SerPl-aCnc: <10 IU/mL (ref <14.0)

## 2021-02-11 NOTE — Telephone Encounter (Signed)
Scheduled appt per 2/7 referral. Spoke to pt who is aware of appt date and time. Pt is aware to arrive 15 mins prior to appt time.  °

## 2021-02-22 ENCOUNTER — Telehealth: Payer: BC Managed Care – PPO | Admitting: Physician Assistant

## 2021-02-22 ENCOUNTER — Telehealth: Payer: Medicaid Other | Admitting: Physician Assistant

## 2021-02-22 DIAGNOSIS — J4531 Mild persistent asthma with (acute) exacerbation: Secondary | ICD-10-CM

## 2021-02-22 DIAGNOSIS — J45909 Unspecified asthma, uncomplicated: Secondary | ICD-10-CM

## 2021-02-22 MED ORDER — PREDNISONE 20 MG PO TABS: 40.0000 mg | ORAL_TABLET | Freq: Every day | ORAL | 0 refills | Status: DC

## 2021-02-22 MED ORDER — ALBUTEROL SULFATE HFA 108 (90 BASE) MCG/ACT IN AERS: 2.0000 | INHALATION_SPRAY | Freq: Four times a day (QID) | RESPIRATORY_TRACT | 0 refills | Status: DC | PRN

## 2021-02-22 NOTE — Progress Notes (Signed)
Duplicate, needed albuterol refill

## 2021-02-22 NOTE — Progress Notes (Signed)
Visit for Asthma  Based on what you have shared with me, it looks like you may have a flare up of your asthma.  Asthma is a chronic (ongoing) lung disease which results in airway obstruction, inflammation and hyper-responsiveness.   Asthma symptoms vary from person to person, with common symptoms including nighttime awakening and decreased ability to participate in normal activities as a result of shortness of breath. It is often triggered by changes in weather, changes in the season, changes in air temperature, or inside (home, school, daycare or work) allergens such as animal dander, mold, mildew, woodstoves or cockroaches.   It can also be triggered by hormonal changes, extreme emotion, physical exertion or an upper respiratory tract illness.     It is important to identify the trigger, and then eliminate or avoid the trigger if possible.   If you have been prescribed medications to be taken on a regular basis, it is important to follow the asthma action plan and to follow guidelines to adjust medication in response to increasing symptoms of decreased peak expiratory flow rate  Treatment: I have prescribed: Prednisone 40mg by mouth per day for 5 - 7 days  HOME CARE Only take medications as instructed by your medical team. Consider wearing a mask or scarf to improve breathing air temperature have been shown to decrease irritation and decrease exacerbations Get rest. Taking a steamy shower or using a humidifier may help nasal congestion sand ease sore throat pain. You can place a towel over your head and breathe in the steam from hot water coming from a faucet. Using a saline nasal spray works much the same way.  Cough drops, hare candies and sore throat lozenges may ease your cough.  Avoid close contacts especially the very you and the elderly Cover your mouth if you cough or  sneeze Always remember to wash your hands.    GET HELP RIGHT AWAY IF: You develop worsening symptoms; breathlessness at rest, drowsy, confused or agitated, unable to speak in full sentences You have coughing fits You develop a severe headache or visual changes You develop shortness of breath, difficulty breathing or start having chest pain Your symptoms persist after you have completed your treatment plan If your symptoms do not improve within 10 days  MAKE SURE YOU Understand these instructions. Will watch your condition. Will get help right away if you are not doing well or get worse.   Your e-visit answers were reviewed by a board certified advanced clinical practitioner to complete your personal care plan, Depending upon the condition, your plan could have included both over the counter or prescription medications.   Please review your pharmacy choice. Your safety is important to us. If you have drug allergies check your prescription carefully.  You can use MyChart to ask questions about today's visit, request a non-urgent  call back, or ask for a work or school excuse for 24 hours related to this e-Visit. If it has been greater than 24 hours you will need to follow up with your provider, or enter a new e-Visit to address those concerns.   You will get an e-mail in the next two days asking about your experience. I hope that your e-visit has been valuable and will speed your recovery. Thank you for using e-visits.  I provided 5 minutes of non face-to-face time during this encounter for chart review and documentation.   

## 2021-03-01 ENCOUNTER — Encounter: Payer: Medicaid Other | Admitting: Obstetrics

## 2021-03-02 ENCOUNTER — Encounter (INDEPENDENT_AMBULATORY_CARE_PROVIDER_SITE_OTHER): Payer: Self-pay | Admitting: Adult Health

## 2021-03-02 ENCOUNTER — Telehealth (INDEPENDENT_AMBULATORY_CARE_PROVIDER_SITE_OTHER): Payer: BC Managed Care – PPO | Admitting: Adult Health

## 2021-03-02 ENCOUNTER — Other Ambulatory Visit: Payer: Self-pay

## 2021-03-02 DIAGNOSIS — E559 Vitamin D deficiency, unspecified: Secondary | ICD-10-CM | POA: Diagnosis not present

## 2021-03-02 DIAGNOSIS — Z6841 Body Mass Index (BMI) 40.0 and over, adult: Secondary | ICD-10-CM | POA: Diagnosis not present

## 2021-03-02 DIAGNOSIS — E669 Obesity, unspecified: Secondary | ICD-10-CM | POA: Diagnosis not present

## 2021-03-02 DIAGNOSIS — E8881 Metabolic syndrome: Secondary | ICD-10-CM | POA: Diagnosis not present

## 2021-03-02 MED ORDER — METFORMIN HCL 500 MG PO TABS: ORAL_TABLET | ORAL | 0 refills | Status: DC

## 2021-03-02 MED ORDER — VITAMIN D (ERGOCALCIFEROL) 1.25 MG (50000 UNIT) PO CAPS: 50000.0000 [IU] | ORAL_CAPSULE | ORAL | 0 refills | Status: DC

## 2021-03-03 ENCOUNTER — Other Ambulatory Visit (INDEPENDENT_AMBULATORY_CARE_PROVIDER_SITE_OTHER): Payer: Self-pay | Admitting: Adult Health

## 2021-03-03 ENCOUNTER — Ambulatory Visit: Payer: BC Managed Care – PPO | Admitting: Nurse Practitioner

## 2021-03-03 ENCOUNTER — Encounter (INDEPENDENT_AMBULATORY_CARE_PROVIDER_SITE_OTHER): Payer: Self-pay

## 2021-03-03 DIAGNOSIS — E8881 Metabolic syndrome: Secondary | ICD-10-CM

## 2021-03-03 DIAGNOSIS — Z6841 Body Mass Index (BMI) 40.0 and over, adult: Secondary | ICD-10-CM | POA: Insufficient documentation

## 2021-03-03 NOTE — Telephone Encounter (Signed)
PA needed

## 2021-03-03 NOTE — Telephone Encounter (Signed)
Prior authorization started for [facility]. Per immediate response from insurance company: medication is not covered by plan. Patient notified via mychart. ?

## 2021-03-03 NOTE — Telephone Encounter (Signed)
LOV with [place]y

## 2021-03-03 NOTE — Telephone Encounter (Signed)
PA needed, this may be a duplicate. ?

## 2021-03-03 NOTE — Progress Notes (Signed)
? ? ?TeleHealth Visit:  ?Due to the COVID-19 pandemic, this visit was completed with telemedicine (audio/video) technology to reduce patient and provider exposure as well as to preserve personal protective equipment.  ? ?[name] has verbally consented to this TeleHealth visit. The patient is located at home, the provider is located at the [facility] office. The participants in this visit include the listed provider and patient. The visit was conducted today via MyChart video. ? ?Chief Complaint: OBESITY ?Debra Horn is here to discuss her progress with her obesity treatment plan along with follow-up of her obesity related diagnoses. Debra Horn is on the Category 2 Plan and states she is following her eating plan approximately 80% of the time. Debra Horn states she is walking for 30 minutes 3 times per week. ? ?Today's visit was #: 10 ?Starting weight: 244 lbs ?Starting date: 09/15/2020 ? ?Interim History:  ?Debra Horn has a cough, nasal congestion, and GI upset that began approximately 7 days ago.   ?She denies fever and reports GI upset is resolved, however, respiratory symptoms are still present. ?She has not tested herself for COVID-19, however several of her family members (with similar sx's) have tested negative for COVID-10. ?She denies dyspnea, however has been wheezing and using Flovent daily. ? ?[facility] - Wegovy shortage until the end of March 2023. ? ?Subjective:  ? ?1. Insulin resistance ?Provided prescription for Wegovy at last OV on 02/09/21. ?Per [facility] - Wegovy shortage until the end of March 2023. ?She has remained on metformin 500mg  QHS. ? ?2. Vitamin D deficiency ?02/04/21 Vit D Level- 48.9- below goal of 50-70. ? ?Assessment/Plan:  ? ?1. Insulin resistance ?Refill metformin 500 mg QHS. ? ?- Refill metFORMIN (GLUCOPHAGE) 500 MG tablet; 1 tab at dinner  Dispense: 30 tablet; Refill: 0 ? ?When able to obtain [facility], start at 0.25mg  once week and stop Metformin. ? ?2. Vitamin D deficiency ?Refill ergocalciferol  50,000 IU once weekly. ? ?- Refill Vitamin D, Ergocalciferol, (DRISDOL) 1.25 MG (50000 UNIT) CAPS capsule; Take 1 capsule (50,000 Units total) by mouth every 7 (seven) days.  Dispense: 4 capsule; Refill: 0 ? ?3. Obesity with current BMI of 45.0 ? ?Debra Horn is currently in the action stage of change. As such, her goal is to continue with weight loss efforts. She has agreed to the Category 2 Plan.  ? ?Get more rest, increase fluids, remain home with acute symptoms. ? ?Exercise goals:  As is. ? ?Behavioral modification strategies: increasing lean protein intake, decreasing simple carbohydrates, increasing water intake, keeping healthy foods in the home, ways to avoid boredom eating, and planning for success. ? ?Debra Horn has agreed to follow-up with our clinic in 5 weeks. She was informed of the importance of frequent follow-up visits to maximize her success with intensive lifestyle modifications for her multiple health conditions. ? ?Objective:  ? ?VITALS: Per patient if applicable, see vitals. ?GENERAL: Alert and in no acute distress. ?CARDIOPULMONARY: No increased WOB. Speaking in clear sentences.  ?PSYCH: Pleasant and cooperative. Speech normal rate and rhythm. Affect is appropriate. Insight and judgement are appropriate. Attention is focused, linear, and appropriate.  ?NEURO: Oriented as arrived to appointment on time with no prompting.  ? ?Lab Results  ?Component Value Date  ? CREATININE 0.82 11/18/2020  ? BUN 16 11/18/2020  ? NA 140 11/18/2020  ? K 3.7 11/18/2020  ? CL 105 11/18/2020  ? CO2 27 11/18/2020  ? ?Lab Results  ?Component Value Date  ? ALT 21 08/15/2020  ? AST 18 08/15/2020  ?  ALKPHOS 104 08/15/2020  ? BILITOT 0.5 08/15/2020  ? ?Lab Results  ?Component Value Date  ? HGBA1C 5.6 02/04/2021  ? HGBA1C 5.4 09/16/2020  ? HGBA1C 5.0 02/09/2017  ? HGBA1C 5.6 02/19/2014  ? ?Lab Results  ?Component Value Date  ? INSULIN 17.6 02/04/2021  ? INSULIN 16.1 09/16/2020  ? ?Lab Results  ?Component Value Date  ? TSH 1.520  09/16/2020  ? ?Lab Results  ?Component Value Date  ? CHOL 155 09/16/2020  ? HDL 46 09/16/2020  ? [place] 97 09/16/2020  ? TRIG 62 09/16/2020  ? ?Lab Results  ?Component Value Date  ? VD25OH 48.9 02/04/2021  ? VD25OH 18.1 (L) 09/16/2020  ? VD25OH 8.4 (L) 02/09/2017  ? ?Lab Results  ?Component Value Date  ? WBC 9.0 11/18/2020  ? HGB 9.4 (L) 11/18/2020  ? HCT 31.0 (L) 11/18/2020  ? MCV 80.7 11/18/2020  ? PLT 475 (H) 11/18/2020  ? ?Attestation Statements:  ? ?Reviewed by clinician on day of visit: allergies, medications, problem list, medical history, surgical history, family history, social history, and previous encounter notes. ? ?Time spent on visit including pre-visit chart review and post-visit charting and care was 27 minutes.  ? ?I, Water quality scientist, CMA, am acting as Location manager for Mina Marble, NP. ? ?I have reviewed the above documentation for accuracy and completeness, and I agree with the above. - Emmitte Surgeon d. Cheynne Virden, NP-C ?

## 2021-03-03 NOTE — Telephone Encounter (Signed)
Duplicate request. Patient already sent message PA denied.  ?

## 2021-03-04 ENCOUNTER — Other Ambulatory Visit: Payer: Self-pay | Admitting: Internal Medicine

## 2021-03-05 ENCOUNTER — Other Ambulatory Visit: Payer: Self-pay | Admitting: Internal Medicine

## 2021-03-05 DIAGNOSIS — D649 Anemia, unspecified: Secondary | ICD-10-CM

## 2021-03-07 ENCOUNTER — Other Ambulatory Visit: Payer: Self-pay

## 2021-03-07 ENCOUNTER — Inpatient Hospital Stay: Payer: BC Managed Care – PPO | Attending: Physician Assistant | Admitting: Internal Medicine

## 2021-03-07 ENCOUNTER — Telehealth: Payer: Self-pay | Admitting: Medical Oncology

## 2021-03-07 ENCOUNTER — Inpatient Hospital Stay (HOSPITAL_BASED_OUTPATIENT_CLINIC_OR_DEPARTMENT_OTHER): Payer: BC Managed Care – PPO

## 2021-03-07 VITALS — BP 142/87 | HR 98 | Temp 97.2°F | Resp 19 | Ht 62.0 in | Wt 250.5 lb

## 2021-03-07 DIAGNOSIS — N920 Excessive and frequent menstruation with regular cycle: Secondary | ICD-10-CM | POA: Insufficient documentation

## 2021-03-07 DIAGNOSIS — D75838 Other thrombocytosis: Secondary | ICD-10-CM | POA: Insufficient documentation

## 2021-03-07 DIAGNOSIS — D649 Anemia, unspecified: Secondary | ICD-10-CM

## 2021-03-07 DIAGNOSIS — Z86711 Personal history of pulmonary embolism: Secondary | ICD-10-CM | POA: Diagnosis not present

## 2021-03-07 DIAGNOSIS — Z8639 Personal history of other endocrine, nutritional and metabolic disease: Secondary | ICD-10-CM | POA: Insufficient documentation

## 2021-03-07 DIAGNOSIS — D500 Iron deficiency anemia secondary to blood loss (chronic): Secondary | ICD-10-CM | POA: Diagnosis not present

## 2021-03-07 DIAGNOSIS — E538 Deficiency of other specified B group vitamins: Secondary | ICD-10-CM | POA: Diagnosis not present

## 2021-03-07 LAB — RETICULOCYTES
Immature Retic Fract: 26.9 % — ABNORMAL HIGH (ref 2.3–15.9)
RBC.: 3.91 MIL/uL (ref 3.87–5.11)
Retic Count, Absolute: 57.9 10*3/uL (ref 19.0–186.0)
Retic Ct Pct: 1.5 % (ref 0.4–3.1)

## 2021-03-07 LAB — FOLATE: Folate: 3.8 ng/mL — ABNORMAL LOW (ref >5.9)

## 2021-03-07 LAB — CBC WITH DIFFERENTIAL (CANCER CENTER ONLY)
Abs Immature Granulocytes: 0.01 10*3/uL (ref 0.00–0.07)
Basophils Absolute: 0 10*3/uL (ref 0.0–0.1)
Basophils Relative: 0 %
Eosinophils Absolute: 0.3 10*3/uL (ref 0.0–0.5)
Eosinophils Relative: 4 %
HCT: 30.5 % — ABNORMAL LOW (ref 36.0–46.0)
Hemoglobin: 9.2 g/dL — ABNORMAL LOW (ref 12.0–15.0)
Immature Granulocytes: 0 %
Lymphocytes Relative: 36 %
Lymphs Abs: 2.8 10*3/uL (ref 0.7–4.0)
MCH: 23.5 pg — ABNORMAL LOW (ref 26.0–34.0)
MCHC: 30.2 g/dL (ref 30.0–36.0)
MCV: 78 fL — ABNORMAL LOW (ref 80.0–100.0)
Monocytes Absolute: 0.6 10*3/uL (ref 0.1–1.0)
Monocytes Relative: 8 %
Neutro Abs: 4.1 10*3/uL (ref 1.7–7.7)
Neutrophils Relative %: 52 %
Platelet Count: 524 10*3/uL — ABNORMAL HIGH (ref 150–400)
RBC: 3.91 MIL/uL (ref 3.87–5.11)
RDW: 18.3 % — ABNORMAL HIGH (ref 11.5–15.5)
WBC Count: 7.9 10*3/uL (ref 4.0–10.5)
nRBC: 0 % (ref 0.0–0.2)

## 2021-03-07 LAB — FERRITIN: Ferritin: 6 ng/mL — ABNORMAL LOW (ref 11–307)

## 2021-03-07 LAB — CMP (CANCER CENTER ONLY)
ALT: 12 U/L (ref 0–44)
AST: 11 U/L — ABNORMAL LOW (ref 15–41)
Albumin: 3.9 g/dL (ref 3.5–5.0)
Alkaline Phosphatase: 93 U/L (ref 38–126)
Anion gap: 7 (ref 5–15)
BUN: 16 mg/dL (ref 6–20)
CO2: 26 mmol/L (ref 22–32)
Calcium: 9.2 mg/dL (ref 8.9–10.3)
Chloride: 105 mmol/L (ref 98–111)
Creatinine: 0.76 mg/dL (ref 0.44–1.00)
GFR, Estimated: >60 mL/min (ref >60)
Glucose, Bld: 88 mg/dL (ref 70–99)
Potassium: 3.7 mmol/L (ref 3.5–5.1)
Sodium: 138 mmol/L (ref 135–145)
Total Bilirubin: 0.3 mg/dL (ref 0.3–1.2)
Total Protein: 7.9 g/dL (ref 6.5–8.1)

## 2021-03-07 LAB — VITAMIN B12: Vitamin B-12: 313 pg/mL (ref 180–914)

## 2021-03-07 LAB — IRON AND IRON BINDING CAPACITY (CC-WL,HP ONLY)
Iron: 19 ug/dL — ABNORMAL LOW (ref 28–170)
Saturation Ratios: 4 % — ABNORMAL LOW (ref 10.4–31.8)
TIBC: 486 ug/dL — ABNORMAL HIGH (ref 250–450)
UIBC: 467 ug/dL — ABNORMAL HIGH (ref 148–442)

## 2021-03-07 MED ORDER — FOLIC ACID 1 MG PO TABS: 1.0000 mg | ORAL_TABLET | Freq: Every day | ORAL | 4 refills | Status: DC

## 2021-03-07 NOTE — Telephone Encounter (Signed)
Pt notified that her folate is low and per Dr. Arbutus Ped, he sent in rx for  folic acid for her to start.Pt voiced understanding. ?

## 2021-03-07 NOTE — Progress Notes (Signed)
[facility] [phone]   [phone]  CONSULT NOTE  REFERRING PHYSICIAN: Eliezer Bottom, PA-C  REASON FOR CONSULTATION:  36 years old African-American female with iron deficiency anemia  HPI Debra Horn is a 36 y.o. female with past medical history significant for asthma, GERD, history of thyroid issues many years ago, obstructive sleep apnea, hypertension during pregnancy, vitamin D deficiency as well as history of iron deficiency anemia for many years.  The patient mentions that the first time she heard about her anemia was during her pregnancy.  She took some iron supplements at that time and also she required PRBCs transfusion.  She was seen recently by her primary care provider and was noticed on several occasions to have persistent iron deficiency anemia.  The patient does not take any iron supplements except for liquid multivitamins that is not effective in improving her anemia.  Her anemia is secondary to menorrhagia with heavy menstrual period that last around 7 days with some clots. She is symptomatic from her anemia with fatigue, dizzy spells as well as shortness of breath and craving to soap.  She also has some visual changes and aching muscular chest pain.  She has no cough or hemoptysis.  She denied having any nausea, vomiting, diarrhea or constipation.  She has no headache. Family history significant for father with hypertension and prostate cancer, mother had rheumatoid arthritis and sister had skin cancer.  Maternal grandmother had leukemia. The patient is married and has 3 children.  She works at [employer].  She has no history of smoking, alcohol or drug abuse.  HPI  Past Medical History:  Diagnosis Date   Anemia    low iron   Asthma    Complication of anesthesia    bp dropped in middle of last c-section and started with n/v   GERD (gastroesophageal reflux disease)    during pregnancy   Headache    "couple  days/week" (10/28/2015)   Hyperthyroidism    not on medication   PE (pulmonary embolism) 11/2011   PONV (postoperative nausea and vomiting)    Pregnancy induced hypertension    Preterm labor    Sleep apnea    SOB (shortness of breath)    Thyroid condition    Vitamin D deficiency     Past Surgical History:  Procedure Laterality Date   CESAREAN SECTION  2008; 2014   CESAREAN SECTION N/A 07/27/2017   Procedure: REPEAT CESAREAN SECTION;  Surgeon: Reva Bores, MD;  Location: [facility]IRTH[facility];  Service: Obstetrics;  Laterality: N/A;   FRACTURE SURGERY     ORIF ANKLE FRACTURE Right 10/28/2015   ORIF ANKLE FRACTURE Right 10/28/2015   Procedure: OPEN REDUCTION INTERNAL FIXATION (ORIF) RIGHT ANKLE FRACTURE;  Surgeon: Yolonda Kida, MD;  Location: [facility] OR;  Service: Orthopedics;  Laterality: Right;   TUBAL LIGATION Bilateral 07/27/2017   Procedure: BILATERAL TUBAL LIGATION;  Surgeon: Reva Bores, MD;  Location: [facility] BIRTHING SUITES;  Service: Obstetrics;  Laterality: Bilateral;    Family History  Problem Relation Age of Onset   Hypertension Maternal Grandfather    Hypertension Paternal Grandmother    Cancer Paternal Grandmother    Hyperlipidemia Paternal Grandmother    Diabetes Paternal Grandmother    Hypertension Father    Diabetes Father    Cancer Sister        skin   Cancer Maternal Grandmother        leukemia   Rheum arthritis Mother  Lupus Mother    Hypothyroidism Maternal Aunt    Lupus Other     Social History Social History   Tobacco Use   Smoking status: Never   Smokeless tobacco: Never  Vaping Use   Vaping Use: Never used  Substance Use Topics   Alcohol use: No   Drug use: No    Allergies  Allergen Reactions   Banana Anaphylaxis   Cefixime Rash    Current Outpatient Medications  Medication Sig Dispense Refill   albuterol (VENTOLIN HFA) 108 (90 Base) MCG/ACT inhaler Inhale 2 puffs into the lungs every 6 (six) hours as needed for wheezing or  shortness of breath. 18 g 0   fluticasone (FLONASE) 50 MCG/ACT nasal spray SPRAY 2 SPRAYS INTO EACH NOSTRIL EVERY DAY 16 mL 0   fluticasone (FLOVENT HFA) 110 MCG/ACT inhaler Inhale 2 puffs into the lungs in the morning and at bedtime. 1 each 5   meclizine (ANTIVERT) 25 MG tablet Take 1 tablet (25 mg total) by mouth 3 (three) times daily as needed for dizziness. 30 tablet 0   metFORMIN (GLUCOPHAGE) 500 MG tablet 1 tab at dinner 30 tablet 0   predniSONE (DELTASONE) 20 MG tablet Take 2 tablets (40 mg total) by mouth daily with breakfast. 14 tablet 0   Semaglutide-Weight Management 0.25 MG/0.5ML SOAJ Inject 0.25 mg into the skin once a week. 2 mL 0   Vitamin D, Ergocalciferol, (DRISDOL) 1.25 MG (50000 UNIT) CAPS capsule Take 1 capsule (50,000 Units total) by mouth every 7 (seven) days. 4 capsule 0   No current facility-administered medications for this visit.    Review of Systems  Constitutional: positive for fatigue Eyes: negative Ears, nose, mouth, throat, and face: negative Respiratory: positive for dyspnea on exertion Cardiovascular: negative Gastrointestinal: positive for dyspepsia Genitourinary:negative Integument/breast: negative Hematologic/lymphatic: negative Musculoskeletal:positive for arthralgias Neurological: positive for dizziness Behavioral/Psych: negative Endocrine: negative Allergic/Immunologic: negative  Physical Exam  OMB:TDHRC, healthy, no distress, well nourished, well developed, and obese SKIN: skin color, texture, turgor are normal, no rashes or significant lesions HEAD: Normocephalic, No masses, lesions, tenderness or abnormalities EYES: normal, PERRLA, Conjunctiva are pink and non-injected EARS: External ears normal, Canals clear OROPHARYNX:no exudate, no erythema, and lips, buccal mucosa, and tongue normal  NECK: supple, no adenopathy, no JVD LYMPH:  no palpable lymphadenopathy, no hepatosplenomegaly BREAST:not examined LUNGS: clear to auscultation , and  palpation HEART: regular rate & rhythm, no murmurs, and no gallops ABDOMEN:abdomen soft, non-tender, obese, normal bowel sounds, and no masses or organomegaly BACK: Back symmetric, no curvature., No CVA tenderness EXTREMITIES:no joint deformities, effusion, or inflammation, no edema  NEURO: alert & oriented x 3 with fluent speech, no focal motor/sensory deficits  PERFORMANCE STATUS: ECOG 1  LABORATORY DATA: Lab Results  Component Value Date   WBC 7.9 03/07/2021   HGB 9.2 (L) 03/07/2021   HCT 30.5 (L) 03/07/2021   MCV 78.0 (L) 03/07/2021   PLT 524 (H) 03/07/2021      Chemistry      Component Value Date/Time   NA 140 11/18/2020 0514   NA 139 02/09/2017 1233   K 3.7 11/18/2020 0514   CL 105 11/18/2020 0514   CO2 27 11/18/2020 0514   BUN 16 11/18/2020 0514   BUN 4 (L) 02/09/2017 1233   CREATININE 0.82 11/18/2020 0514      Component Value Date/Time   CALCIUM 9.5 11/18/2020 0514   ALKPHOS 104 08/15/2020 2008   AST 18 08/15/2020 2008   ALT 21 08/15/2020 2008   BILITOT  0.5 08/15/2020 2008   BILITOT <0.2 02/09/2017 1233       RADIOGRAPHIC STUDIES: No results found.  ASSESSMENT: This is a very pleasant 36 years old African-American female with persistent iron deficiency anemia secondary to menorrhagia.  The patient also has thrombocytosis that is reactive in nature secondary to the iron deficiency. She is unable to correct her iron deficiency with the oral supplements.   PLAN: I had a lengthy discussion with the patient today about her current condition and treatment options. I ordered several studies today to rule out any other concerning abnormality leading to her anemia. Repeat CBC showed persistent iron deficiency anemia with hemoglobin of 9.1 hematocrit 30.5% with MCV of 78.0.  Her platelets count were elevated at 524,000.  Ferritin level was low at 6 The patient voices understanding of current disease status and treatment options and is in agreement with the current  care plan.  Serum iron and iron binding capacity are still pending.  She also has low serum folate of 3.8.  Vitamin B12, serum protein electrophoresis are still pending. I recommended for the patient to consider treatment with iron infusion with Venofer 300 mg IV weekly for 3 weeks. I will also start the patient on folic acid 1 mg p.o. daily. She will come back for follow-up visit in 3 months for evaluation with repeat CBC, iron study and ferritin. The patient was advised to call immediately if she has any other concerning symptoms in the interval.  All questions were answered. The patient knows to call the clinic with any problems, questions or concerns. We can certainly see the patient much sooner if necessary.  Thank you so much for allowing me to participate in the care of Debra Horn. I will continue to follow up the patient with you and assist in her care.  The total time spent in the appointment was 60 minutes.  Disclaimer: This note was dictated with voice recognition software. Similar sounding words can inadvertently be transcribed and may not be corrected upon review.   Lajuana Matte March 07, 2021, 11:56 AM

## 2021-03-09 LAB — PROTEIN ELECTROPHORESIS, SERUM, WITH REFLEX
A/G Ratio: 0.8 (ref 0.7–1.7)
Albumin ELP: 3.2 g/dL (ref 2.9–4.4)
Alpha-1-Globulin: 0.3 g/dL (ref 0.0–0.4)
Alpha-2-Globulin: 0.8 g/dL (ref 0.4–1.0)
Beta Globulin: 1.3 g/dL (ref 0.7–1.3)
Gamma Globulin: 1.5 g/dL (ref 0.4–1.8)
Globulin, Total: 3.9 g/dL (ref 2.2–3.9)
Total Protein ELP: 7.1 g/dL (ref 6.0–8.5)

## 2021-03-25 ENCOUNTER — Telehealth: Payer: BC Managed Care – PPO | Admitting: Family Medicine

## 2021-03-25 DIAGNOSIS — J4540 Moderate persistent asthma, uncomplicated: Secondary | ICD-10-CM | POA: Diagnosis not present

## 2021-03-25 MED ORDER — ALBUTEROL SULFATE HFA 108 (90 BASE) MCG/ACT IN AERS: 2.0000 | INHALATION_SPRAY | Freq: Four times a day (QID) | RESPIRATORY_TRACT | 0 refills | Status: DC | PRN

## 2021-03-25 NOTE — Progress Notes (Signed)
Visit for Asthma ? ?Based on what you have shared with me, it looks like you may have a flare up of your asthma.  Asthma is a chronic (ongoing) lung disease which results in airway obstruction, inflammation and hyper-responsiveness.  ? ?Asthma symptoms vary from person to person, with common symptoms including nighttime awakening and decreased ability to participate in normal activities as a result of shortness of breath. It is often triggered by changes in weather, changes in the season, changes in air temperature, or inside (home, school, daycare or work) allergens such as animal dander, mold, mildew, woodstoves or cockroaches.   It can also be triggered by hormonal changes, extreme emotion, physical exertion or an upper respiratory tract illness.    ? ?It is important to identify the trigger, and then eliminate or avoid the trigger if possible.  ? ?If you have been prescribed medications to be taken on a regular basis, it is important to follow the asthma action plan and to follow guidelines to adjust medication in response to increasing symptoms of decreased peak expiratory flow rate ? ?Treatment: ?I have prescribed: Albuterol (Proventil HFA; Ventolin HFA) 108 (90 Base) MCG/ACT Inhaler 2 puffs into the lungs every six hours as needed for wheezing or shortness of breath ? ?HOME CARE ?Only take medications as instructed by your medical team. ?Consider wearing a mask or scarf to improve breathing air temperature have been shown to decrease irritation and decrease exacerbations ?Get rest. ?Taking a steamy shower or using a humidifier may help nasal congestion sand ease sore throat pain. You can place a towel over your head and breathe in the steam from hot water coming from a faucet. ?Using a saline nasal spray works much the same way.  ?Cough drops, hare candies and sore throat lozenges may ease your  cough.  ?Avoid close contacts especially the very you and the elderly ?Cover your mouth if you cough or sneeze ?Always remember to wash your hands.  ? ? ?GET HELP RIGHT AWAY IF: ?You develop worsening symptoms; breathlessness at rest, drowsy, confused or agitated, unable to speak in full sentences ?You have coughing fits ?You develop a severe headache or visual changes ?You develop shortness of breath, difficulty breathing or start having chest pain ?Your symptoms persist after you have completed your treatment plan ?If your symptoms do not improve within 10 days ? ?MAKE SURE YOU ?Understand these instructions. ?Will watch your condition. ?Will get help right away if you are not doing well or get worse.  ? ?Your e-visit answers were reviewed by a board certified advanced clinical practitioner to complete your personal care plan, Depending upon the condition, your plan could have included both over the counter or prescription medications.  ? ?Please review your pharmacy choice. ?Your safety is important to [place]. If you have drug allergies check your prescription carefully. ? ?You can use MyChart to ask questions about today's visit, request a non-urgent  ?call back, or ask for a work or school excuse for 24 hours related to this e-Visit. If it has been greater than 24 hours you will need to follow up with your provider, or enter a new e-Visit to address those concerns.  ? ?You will get an e-mail in the next two days asking about your experience. I hope that your e-visit has been valuable and will speed your recovery. Thank you for using e-visits.  ? ? ? ? have provided 5 minutes of non face to face time during this encounter for chart review  and documentation.   ?

## 2021-03-30 ENCOUNTER — Other Ambulatory Visit (INDEPENDENT_AMBULATORY_CARE_PROVIDER_SITE_OTHER): Payer: Self-pay | Admitting: Adult Health

## 2021-03-30 DIAGNOSIS — E8881 Metabolic syndrome: Secondary | ICD-10-CM

## 2021-04-05 ENCOUNTER — Ambulatory Visit: Payer: Medicaid Other | Admitting: Family Medicine

## 2021-04-05 DIAGNOSIS — G4733 Obstructive sleep apnea (adult) (pediatric): Secondary | ICD-10-CM | POA: Diagnosis not present

## 2021-04-06 ENCOUNTER — Ambulatory Visit: Payer: Medicaid Other

## 2021-04-07 ENCOUNTER — Other Ambulatory Visit (HOSPITAL_COMMUNITY)
Admission: RE | Admit: 2021-04-07 | Discharge: 2021-04-07 | Disposition: A | Discharge status: Home / Self Care | Payer: BC Managed Care – PPO | Source: Ambulatory Visit | Attending: Obstetrics and Gynecology | Admitting: Obstetrics and Gynecology

## 2021-04-07 ENCOUNTER — Ambulatory Visit (INDEPENDENT_AMBULATORY_CARE_PROVIDER_SITE_OTHER): Payer: Medicaid Other | Admitting: Obstetrics

## 2021-04-07 ENCOUNTER — Encounter: Payer: Self-pay | Admitting: Obstetrics

## 2021-04-07 VITALS — BP 132/84 | HR 101 | Ht 62.0 in | Wt 255.3 lb

## 2021-04-07 DIAGNOSIS — N898 Other specified noninflammatory disorders of vagina: Secondary | ICD-10-CM | POA: Insufficient documentation

## 2021-04-07 DIAGNOSIS — N760 Acute vaginitis: Secondary | ICD-10-CM | POA: Diagnosis not present

## 2021-04-07 DIAGNOSIS — E66813 Obesity, class 3: Secondary | ICD-10-CM

## 2021-04-07 DIAGNOSIS — Z01419 Encounter for gynecological examination (general) (routine) without abnormal findings: Secondary | ICD-10-CM | POA: Diagnosis not present

## 2021-04-07 DIAGNOSIS — Z6841 Body Mass Index (BMI) 40.0 and over, adult: Secondary | ICD-10-CM

## 2021-04-07 DIAGNOSIS — J301 Allergic rhinitis due to pollen: Secondary | ICD-10-CM

## 2021-04-07 DIAGNOSIS — B9689 Other specified bacterial agents as the cause of diseases classified elsewhere: Secondary | ICD-10-CM

## 2021-04-07 DIAGNOSIS — J4520 Mild intermittent asthma, uncomplicated: Secondary | ICD-10-CM

## 2021-04-07 MED ORDER — METRONIDAZOLE 0.75 % VA GEL: 1.0000 | Freq: Two times a day (BID) | VAGINAL | 5 refills | Status: DC

## 2021-04-07 MED ORDER — ALBUTEROL SULFATE HFA 108 (90 BASE) MCG/ACT IN AERS: 2.0000 | INHALATION_SPRAY | Freq: Four times a day (QID) | RESPIRATORY_TRACT | 5 refills | Status: DC | PRN

## 2021-04-07 MED ORDER — LORATADINE 10 MG PO TABS: 10.0000 mg | ORAL_TABLET | Freq: Every day | ORAL | 11 refills | Status: DC

## 2021-04-07 NOTE — Progress Notes (Signed)
? ?Subjective: ? ? ?  ?  ? Debra Horn is a 36 y.o. female here for a routine exam.  Current complaints: Vaginal discharge.   ? ?Personal health questionnaire:  ?Is patient Ashkenazi Jewish, have a family history of breast and/or ovarian cancer: no ?Is there a family history of uterine cancer diagnosed at age < 1, gastrointestinal cancer, urinary tract cancer, family member who is a Personnel officer syndrome-associated carrier: no ?Is the patient overweight and hypertensive, family history of diabetes, personal history of gestational diabetes, preeclampsia or PCOS: no ?Is patient over 16, have PCOS,  family history of premature CHD under age 75, diabetes, smoke, have hypertension or peripheral artery disease:  no ?At any time, has a partner hit, kicked or otherwise hurt or frightened you?: no ?Over the past 2 weeks, have you felt down, depressed or hopeless?: no ?Over the past 2 weeks, have you felt little interest or pleasure in doing things?:no ? ? ?Gynecologic History ?Patient's last menstrual period was 03/28/2021. ?Contraception: tubal ligation ?Last Pap: 2019. Results were: normal ?Last mammogram: n/a. Results were: n/a ? ?Obstetric History ?OB History  ?Gravida Para Term Preterm AB Living  ?3 3 1 2   3   ?SAB IAB Ectopic Multiple Live Births  ?      0 3  ?  ?# Outcome Date GA Lbr Len/2nd Weight Sex Delivery Anes PTL Lv  ?3 Term 07/27/17 [redacted]w[redacted]d  5 lb 12.6 oz (2.625 kg) M CS-LTranv Spinal  LIV  ?2 Preterm 08/25/12 [redacted]w[redacted]d  3 lb 10 oz (1.644 kg) F CS-Unspec   LIV  ?   Birth Comments: System Generated. Please review and update pregnancy details.  ?1 Preterm 06/21/06 [redacted]w[redacted]d  3 lb 7 oz (1.559 kg) M CS-Unspec  Y LIV  ?   Birth Comments: PreE  ? ? ?Past Medical History:  ?Diagnosis Date  ? Anemia   ? low iron  ? Asthma   ? Complication of anesthesia   ? bp dropped in middle of last c-section and started with n/v  ? GERD (gastroesophageal reflux disease)   ? during pregnancy  ? Headache   ? "couple days/week" (10/28/2015)  ?  Hyperthyroidism   ? not on medication  ? PE (pulmonary embolism) 11/2011  ? PONV (postoperative nausea and vomiting)   ? Pregnancy induced hypertension   ? Preterm labor   ? Sleep apnea   ? SOB (shortness of breath)   ? Thyroid condition   ? Vitamin D deficiency   ?  ?Past Surgical History:  ?Procedure Laterality Date  ? CESAREAN SECTION  2008; 2014  ? CESAREAN SECTION N/A 07/27/2017  ? Procedure: REPEAT CESAREAN SECTION;  Surgeon: 07/29/2017, MD;  Location: [facility] BIRTHING SUIT[facility];  Service: Obstetrics;  Laterality: N/A;  ? FRACTURE SURGERY    ? ORIF ANKLE FRACTURE Right 10/28/2015  ? ORIF ANKLE FRACTURE Right 10/28/2015  ? Procedure: OPEN REDUCTION INTERNAL FIXATION (ORIF) RIGHT ANKLE FRACTURE;  Surgeon: 10/30/2015, MD;  Location: [facility] OR;  Service: Orthopedics;  Laterality: Right;  ? TUBAL LIGATION Bilateral 07/27/2017  ? Procedure: BILATERAL TUBAL LIGATION;  Surgeon: 07/29/2017, MD;  Location: [facility] BIRTHING SUITES;  Service: Obstetrics;  Laterality: Bilateral;  ?  ? ?Current Outpatient Medications:  ?  albuterol (PROAIR HFA) 108 (90 Base) MCG/ACT inhaler, Inhale 2 puffs into the lungs every 6 (six) hours as needed for shortness of breath., Disp: 1 each, Rfl: 0 ?  fluticasone (FLONASE) 50 MCG/ACT nasal spray, SPRAY 2 SPRAYS  INTO EACH NOSTRIL EVERY DAY, Disp: 16 mL, Rfl: 0 ?  fluticasone (FLOVENT HFA) 110 MCG/ACT inhaler, Inhale 2 puffs into the lungs in the morning and at bedtime., Disp: 1 each, Rfl: 5 ?  folic acid (FOLVITE) 1 MG tablet, Take 1 tablet (1 mg total) by mouth daily., Disp: 30 tablet, Rfl: 4 ?  Vitamin D, Ergocalciferol, (DRISDOL) 1.25 MG (50000 UNIT) CAPS capsule, Take 1 capsule (50,000 Units total) by mouth every 7 (seven) days., Disp: 4 capsule, Rfl: 0 ?  meclizine (ANTIVERT) 25 MG tablet, Take 1 tablet (25 mg total) by mouth 3 (three) times daily as needed for dizziness. (Patient not taking: Reported on 04/07/2021), Disp: 30 tablet, Rfl: 0 ?  metFORMIN (GLUCOPHAGE) 500 MG tablet, 1 tab at  dinner (Patient not taking: Reported on 04/07/2021), Disp: 30 tablet, Rfl: 0 ?Allergies  ?Allergen Reactions  ? Banana Anaphylaxis  ? Cefixime Rash  ?  ?Social History  ? ?Tobacco Use  ? Smoking status: Never  ? Smokeless tobacco: Never  ?Substance Use Topics  ? Alcohol use: No  ?  ?Family History  ?Problem Relation Age of Onset  ? Hypertension Maternal Grandfather   ? Hypertension Paternal Grandmother   ? Cancer Paternal Grandmother   ? Hyperlipidemia Paternal Grandmother   ? Diabetes Paternal Grandmother   ? Hypertension Father   ? Diabetes Father   ? Cancer Sister   ?     skin  ? Cancer Maternal Grandmother   ?     leukemia  ? Rheum arthritis Mother   ? Lupus Mother   ? Hypothyroidism Maternal Aunt   ? Lupus Other   ?  ? ? ?Review of Systems ? ?Constitutional: negative for fatigue and weight loss ?Respiratory: negative for cough and wheezing ?Cardiovascular: negative for chest pain, fatigue and palpitations ?Gastrointestinal: negative for abdominal pain and change in bowel habits ?Musculoskeletal:negative for myalgias ?Neurological: negative for gait problems and tremors ?Behavioral/Psych: negative for abusive relationship, depression ?Endocrine: negative for temperature intolerance    ?Genitourinary:negative for abnormal menstrual periods, genital lesions, hot flashes, sexual problems and vaginal discharge ?Integument/breast: negative for breast lump, breast tenderness, nipple discharge and skin lesion(s) ? ?  ?Objective:  ? ?    ?BP 132/84   Pulse (!) 101   Ht 5\' 2"  (1.575 m)   Wt 255 lb 4.8 oz (115.8 kg)   LMP 03/28/2021   BMI 46.69 kg/m?  ?General:   alert  ?Skin:   no rash or abnormalities  ?Lungs:   clear to auscultation bilaterally  ?Heart:   regular rate and rhythm, S1, S2 normal, no murmur, click, rub or gallop  ?Breasts:   normal without suspicious masses, skin or nipple changes or axillary nodes  ?Abdomen:  normal findings: no organomegaly, soft, non-tender and no hernia  ?Pelvis:  External  genitalia: normal general appearance ?Urinary system: urethral meatus normal and bladder without fullness, nontender ?Vaginal: normal without tenderness, induration or masses ?Cervix: normal appearance ?Adnexa: normal bimanual exam ?Uterus: anteverted and non-tender, normal size  ? ?Lab Review ?Urine pregnancy test ?Labs reviewed yes ?Radiologic studies reviewed no ? ?I have spent a total of 20 minutes of face-to-face time, excluding clinical staff time, reviewing notes and preparing to see patient, ordering tests and/or medications, and counseling the patient.  ? ?Assessment:  ? ?1. Encounter for routine gynecological examination with Papanicolaou smear of cervix ?Rx: ?- Cytology - PAP( [place]) ? ?2. Vaginal discharge ?Rx: ?- Cervicovaginal ancillary only( ) ? ?3. BV (bacterial  vaginosis) ?Rx: ?- metroNIDAZOLE (METROGEL VAGINAL) 0.75 % vaginal gel; Place 1 Applicatorful vaginally 2 (two) times daily. Use monthly after the period foe 6 months  Dispense: 70 g; Refill: 5 ? ?4. Mild intermittent asthma without complication ?Rx: ?- albuterol (PROAIR HFA) 108 (90 Base) MCG/ACT inhaler; Inhale 2 puffs into the lungs every 6 (six) hours as needed for shortness of breath.  Dispense: 1 each; Refill: 5 ? ?5. Seasonal allergic rhinitis due to pollen ?Rx: ?- loratadine (CLARITIN) 10 MG tablet; Take 1 tablet (10 mg total) by mouth daily.  Dispense: 30 tablet; Refill: 11 ? ?6. Class 3 severe obesity due to excess calories without serious comorbidity with body mass index (BMI) of 45.0 to 49.9 in adult [facility]) ?- weight reduction with the aid of dietary changes, exercise and behavioral modification recommended ? ?  ?Plan:  ? ? Education reviewed: calcium supplements, depression evaluation, low fat, low cholesterol diet, safe sex/STD prevention, self breast exams, and weight bearing exercise. ?Follow up in: 1 year.  ? ? ? Brock Bad, MD ?04/07/2021 3:04 PM  ?

## 2021-04-07 NOTE — Progress Notes (Signed)
Patient presents for recurrent BV. Patient complains of having vaginal, discharge, odor, and irritation. She would like to have STD testing. ? ?Last pap: 02/2017 Normal ?

## 2021-04-08 LAB — CERVICOVAGINAL ANCILLARY ONLY
Bacterial Vaginitis (gardnerella): NEGATIVE
Candida Glabrata: NEGATIVE
Candida Vaginitis: NEGATIVE
Chlamydia: NEGATIVE
Comment: NEGATIVE
Comment: NEGATIVE
Comment: NEGATIVE
Comment: NEGATIVE
Comment: NEGATIVE
Comment: NORMAL
Neisseria Gonorrhea: NEGATIVE
Trichomonas: NEGATIVE

## 2021-04-12 ENCOUNTER — Encounter: Payer: Medicaid Other | Admitting: Obstetrics and Gynecology

## 2021-04-12 LAB — CYTOLOGY - PAP
Comment: NEGATIVE
Diagnosis: NEGATIVE
High risk HPV: NEGATIVE

## 2021-04-13 ENCOUNTER — Emergency Department (HOSPITAL_COMMUNITY)
Admission: EM | Admit: 2021-04-13 | Discharge: 2021-04-13 | Disposition: A | Discharge status: Home / Self Care | Payer: BC Managed Care – PPO | Attending: Emergency Medicine | Admitting: Emergency Medicine

## 2021-04-13 ENCOUNTER — Encounter (HOSPITAL_COMMUNITY): Payer: Self-pay

## 2021-04-13 ENCOUNTER — Emergency Department (HOSPITAL_COMMUNITY): Payer: BC Managed Care – PPO

## 2021-04-13 ENCOUNTER — Ambulatory Visit (INDEPENDENT_AMBULATORY_CARE_PROVIDER_SITE_OTHER): Payer: BC Managed Care – PPO

## 2021-04-13 VITALS — BP 107/71 | HR 72 | Temp 98.4°F | Resp 16 | Ht 62.0 in | Wt 256.6 lb

## 2021-04-13 DIAGNOSIS — K449 Diaphragmatic hernia without obstruction or gangrene: Secondary | ICD-10-CM | POA: Diagnosis not present

## 2021-04-13 DIAGNOSIS — R000 Tachycardia, unspecified: Secondary | ICD-10-CM | POA: Diagnosis not present

## 2021-04-13 DIAGNOSIS — Z7951 Long term (current) use of inhaled steroids: Secondary | ICD-10-CM | POA: Diagnosis not present

## 2021-04-13 DIAGNOSIS — J45909 Unspecified asthma, uncomplicated: Secondary | ICD-10-CM | POA: Diagnosis not present

## 2021-04-13 DIAGNOSIS — T8092XA Unspecified transfusion reaction, initial encounter: Secondary | ICD-10-CM | POA: Diagnosis not present

## 2021-04-13 DIAGNOSIS — T7840XA Allergy, unspecified, initial encounter: Secondary | ICD-10-CM | POA: Diagnosis not present

## 2021-04-13 DIAGNOSIS — Z20822 Contact with and (suspected) exposure to covid-19: Secondary | ICD-10-CM | POA: Diagnosis not present

## 2021-04-13 DIAGNOSIS — R1310 Dysphagia, unspecified: Secondary | ICD-10-CM | POA: Insufficient documentation

## 2021-04-13 DIAGNOSIS — D500 Iron deficiency anemia secondary to blood loss (chronic): Secondary | ICD-10-CM

## 2021-04-13 DIAGNOSIS — R0789 Other chest pain: Secondary | ICD-10-CM | POA: Diagnosis not present

## 2021-04-13 DIAGNOSIS — E039 Hypothyroidism, unspecified: Secondary | ICD-10-CM | POA: Diagnosis not present

## 2021-04-13 DIAGNOSIS — B349 Viral infection, unspecified: Secondary | ICD-10-CM | POA: Insufficient documentation

## 2021-04-13 DIAGNOSIS — Z0389 Encounter for observation for other suspected diseases and conditions ruled out: Secondary | ICD-10-CM | POA: Diagnosis not present

## 2021-04-13 LAB — LACTIC ACID, PLASMA: Lactic Acid, Venous: 1.8 mmol/L (ref 0.5–1.9)

## 2021-04-13 LAB — COMPREHENSIVE METABOLIC PANEL
ALT: 18 U/L (ref 0–44)
AST: 25 U/L (ref 15–41)
Albumin: 3.8 g/dL (ref 3.5–5.0)
Alkaline Phosphatase: 73 U/L (ref 38–126)
Anion gap: 8 (ref 5–15)
BUN: 14 mg/dL (ref 6–20)
CO2: 24 mmol/L (ref 22–32)
Calcium: 9.2 mg/dL (ref 8.9–10.3)
Chloride: 104 mmol/L (ref 98–111)
Creatinine, Ser: 0.58 mg/dL (ref 0.44–1.00)
GFR, Estimated: >60 mL/min (ref >60)
Glucose, Bld: 99 mg/dL (ref 70–99)
Potassium: 3.3 mmol/L — ABNORMAL LOW (ref 3.5–5.1)
Sodium: 136 mmol/L (ref 135–145)
Total Bilirubin: 0.8 mg/dL (ref 0.3–1.2)
Total Protein: 8.1 g/dL (ref 6.5–8.1)

## 2021-04-13 LAB — CBC WITH DIFFERENTIAL/PLATELET
Abs Immature Granulocytes: 0.07 10*3/uL (ref 0.00–0.07)
Basophils Absolute: 0 10*3/uL (ref 0.0–0.1)
Basophils Relative: 0 %
Eosinophils Absolute: 0 10*3/uL (ref 0.0–0.5)
Eosinophils Relative: 0 %
HCT: 30.6 % — ABNORMAL LOW (ref 36.0–46.0)
Hemoglobin: 9.4 g/dL — ABNORMAL LOW (ref 12.0–15.0)
Immature Granulocytes: 1 %
Lymphocytes Relative: 8 %
Lymphs Abs: 1.1 10*3/uL (ref 0.7–4.0)
MCH: 23.6 pg — ABNORMAL LOW (ref 26.0–34.0)
MCHC: 30.7 g/dL (ref 30.0–36.0)
MCV: 76.9 fL — ABNORMAL LOW (ref 80.0–100.0)
Monocytes Absolute: 0.5 10*3/uL (ref 0.1–1.0)
Monocytes Relative: 3 %
Neutro Abs: 12.8 10*3/uL — ABNORMAL HIGH (ref 1.7–7.7)
Neutrophils Relative %: 88 %
Platelets: 482 10*3/uL — ABNORMAL HIGH (ref 150–400)
RBC: 3.98 MIL/uL (ref 3.87–5.11)
RDW: 17.6 % — ABNORMAL HIGH (ref 11.5–15.5)
WBC: 14.5 10*3/uL — ABNORMAL HIGH (ref 4.0–10.5)
nRBC: 0 % (ref 0.0–0.2)

## 2021-04-13 LAB — URINALYSIS, ROUTINE W REFLEX MICROSCOPIC
Bilirubin Urine: NEGATIVE
Glucose, UA: NEGATIVE mg/dL
Ketones, ur: 5 mg/dL — AB
Leukocytes,Ua: NEGATIVE
Nitrite: NEGATIVE
Protein, ur: NEGATIVE mg/dL
Specific Gravity, Urine: 1.02 (ref 1.005–1.030)
pH: 7 (ref 5.0–8.0)

## 2021-04-13 LAB — TROPONIN I (HIGH SENSITIVITY)
Troponin I (High Sensitivity): 3 ng/L (ref <18)
Troponin I (High Sensitivity): 3 ng/L (ref <18)

## 2021-04-13 LAB — RESP PANEL BY RT-PCR (FLU A&B, COVID) ARPGX2
Influenza A by PCR: NEGATIVE
Influenza B by PCR: NEGATIVE
SARS Coronavirus 2 by RT PCR: NEGATIVE

## 2021-04-13 LAB — D-DIMER, QUANTITATIVE: D-Dimer, Quant: 1.2 ug/mL-FEU — ABNORMAL HIGH (ref 0.00–0.50)

## 2021-04-13 LAB — PROTIME-INR
INR: 1.1 (ref 0.8–1.2)
Prothrombin Time: 14.1 seconds (ref 11.4–15.2)

## 2021-04-13 LAB — I-STAT BETA HCG BLOOD, ED (MC, WL, AP ONLY): I-stat hCG, quantitative: <5 m[IU]/mL (ref <5)

## 2021-04-13 LAB — APTT: aPTT: 29 seconds (ref 24–36)

## 2021-04-13 MED ORDER — SODIUM CHLORIDE 0.9 % IV BOLUS
1000.0000 mL | Freq: Once | INTRAVENOUS | Status: AC
  Administered 2021-04-13: 1000 mL via INTRAVENOUS

## 2021-04-13 MED ORDER — FAMOTIDINE IN NACL 20-0.9 MG/50ML-% IV SOLN
20.0000 mg | Freq: Once | INTRAVENOUS | Status: AC
  Administered 2021-04-13: 20 mg via INTRAVENOUS
  Filled 2021-04-13: qty 50

## 2021-04-13 MED ORDER — DEXAMETHASONE SODIUM PHOSPHATE 10 MG/ML IJ SOLN
10.0000 mg | Freq: Once | INTRAMUSCULAR | Status: AC
  Administered 2021-04-13: 10 mg
  Filled 2021-04-13: qty 1

## 2021-04-13 MED ORDER — IOHEXOL 350 MG/ML SOLN
100.0000 mL | Freq: Once | INTRAVENOUS | Status: AC | PRN
Start: 2021-04-13 — End: 2021-04-13
  Administered 2021-04-13: 100 mL via INTRAVENOUS

## 2021-04-13 MED ORDER — ONDANSETRON HCL 4 MG/2ML IJ SOLN
4.0000 mg | Freq: Once | INTRAMUSCULAR | Status: AC
  Administered 2021-04-13: 4 mg via INTRAVENOUS
  Filled 2021-04-13: qty 2

## 2021-04-13 MED ORDER — KETOROLAC TROMETHAMINE 15 MG/ML IJ SOLN
15.0000 mg | Freq: Once | INTRAMUSCULAR | Status: AC
  Administered 2021-04-13: 15 mg via INTRAVENOUS
  Filled 2021-04-13: qty 1

## 2021-04-13 MED ORDER — DIPHENHYDRAMINE HCL 50 MG/ML IJ SOLN
50.0000 mg | Freq: Once | INTRAMUSCULAR | Status: AC
  Administered 2021-04-13: 50 mg via INTRAVENOUS
  Filled 2021-04-13: qty 1

## 2021-04-13 MED ORDER — ACETAMINOPHEN 325 MG PO TABS
650.0000 mg | ORAL_TABLET | Freq: Once | ORAL | Status: AC
  Administered 2021-04-13: 650 mg via ORAL
  Filled 2021-04-13: qty 2

## 2021-04-13 MED ORDER — SODIUM CHLORIDE 0.9 % IV SOLN
300.0000 mg | INTRAVENOUS | Status: DC
  Administered 2021-04-13: 300 mg via INTRAVENOUS
  Filled 2021-04-13: qty 15

## 2021-04-13 NOTE — Patient Instructions (Signed)

## 2021-04-13 NOTE — Progress Notes (Signed)
Diagnosis: Iron Deficiency Anemia ? ?Provider:  Chilton Greathouse, MD ? ?Procedure: Infusion ? ?IV Type: Peripheral, IV Location: R Forearm ? ?Entyvio (Vedolizumab), Venofer (Iron Sucrose), Dose: 300 mg ? ?Infusion Start Time: 0912 ? ?Infusion Stop Time: 1050 ? ?Post Infusion IV Care: Observation period completed and Peripheral IV Discontinued ? ?Discharge: Condition: Good, Destination: Home . AVS provided to patient.  ? ?Performed by:  Adriana Mccallum, RN  ?  ?

## 2021-04-13 NOTE — ED Provider Notes (Signed)
?[facility] DEPT ?Provider Note ? ? ?CSN: [id number] ?Arrival date & time: 04/13/21  1744 ? ?  ? ?History ? ?Chief Complaint  ?Patient presents with  ? Allergic Reaction  ? Chest Pain  ? ? ?Debra Horn is a 36 y.o. female. ? ? Patient as above with significant medical history as below, including iron deficiency anemia, hypothyroidism, PE, sleep apnea who presents to the ED with complaint of possible allergic reaction. ? ?Patient underwent iron transfusion earlier today.  Following transfusion she began to have some chest tightness, felt as though her throat was scratchy, she took one-time Benadryl but vomited.  She denies rash.  No difficulty breathing.  No tongue or lip swelling.  No rashes.  No abdominal pain, change in bowel or bladder function.  No fevers or chills.  She has experienced similar symptoms in the past with prior iron transfusions but feels today was worse. ? ? ? ?Past Medical History: ?No date: Anemia ?    Comment:  low iron ?No date: Asthma ?No date: Complication of anesthesia ?    Comment:  bp dropped in middle of last c-section and started with  ?             n/v ?No date: GERD (gastroesophageal reflux disease) ?    Comment:  during pregnancy ?No date: Headache ?    Comment:  "couple days/week" (10/28/2015) ?No date: Hyperthyroidism ?    Comment:  not on medication ?11/2011: PE (pulmonary embolism) ?No date: PONV (postoperative nausea and vomiting) ?No date: Pregnancy induced hypertension ?No date: Preterm labor ?No date: Sleep apnea ?No date: SOB (shortness of breath) ?No date: Thyroid condition ?No date: Vitamin D deficiency ? ?Past Surgical History: ?2008; 2014: CESAREAN SECTION ?07/27/2017: CESAREAN SECTION; N/A ?    Comment:  Procedure: REPEAT CESAREAN SECTION;  Surgeon: Kennon Rounds,  ?             Standley Dakins, MD;  Location: [facility];  Service:  ?             Obstetrics;  Laterality: N/A; ?No date: FRACTURE SURGERY ?10/28/2015: ORIF ANKLE FRACTURE;  Right ?10/28/2015: ORIF ANKLE FRACTURE; Right ?    Comment:  Procedure: OPEN REDUCTION INTERNAL FIXATION (ORIF) RIGHT ?             ANKLE FRACTURE;  Surgeon: Nicholes Stairs, MD;   ?             Location: [facility];  Service: Orthopedics;  Laterality:  ?             Right; ?07/27/2017: TUBAL LIGATION; Bilateral ?    Comment:  Procedure: BILATERAL TUBAL LIGATION;  Surgeon: Kennon Rounds,  ?             Standley Dakins, MD;  Location: [facility];  Service:  ?             Obstetrics;  Laterality: Bilateral;  ? ? ?The history is provided by the patient. No language interpreter was used.  ?Allergic Reaction ?Presenting symptoms: difficulty swallowing   ?Presenting symptoms: no rash   ?Chest Pain ?Associated symptoms: dysphagia and vomiting   ?Associated symptoms: no abdominal pain, no cough, no fever, no headache, no nausea, no palpitations and no shortness of breath   ? ?  ? ?Home Medications ?Prior to Admission medications   ?Medication Sig Start Date End Date Taking? Authorizing Provider  ?albuterol (PROAIR HFA) 108 (90 Base) MCG/ACT inhaler Inhale 2 puffs into  the lungs every 6 (six) hours as needed for shortness of breath. 04/07/21 05/07/21 Yes Shelly Bombard, MD  ?fluticasone (FLONASE) 50 MCG/ACT nasal spray SPRAY 2 SPRAYS INTO EACH NOSTRIL EVERY DAY ?Patient taking differently: Place 2 sprays into both nostrils daily. 12/13/20  Yes Danford, Valetta Fuller D, NP  ?fluticasone (FLOVENT HFA) 110 MCG/ACT inhaler Inhale 2 puffs into the lungs in the morning and at bedtime. 06/24/20  Yes Martyn Ehrich, NP  ?folic acid (FOLVITE) 1 MG tablet Take 1 tablet (1 mg total) by mouth daily. 03/07/21  Yes Curt Bears, MD  ?loratadine (CLARITIN) 10 MG tablet Take 1 tablet (10 mg total) by mouth daily. ?Patient taking differently: Take 10 mg by mouth daily as needed for allergies. 04/07/21  Yes Shelly Bombard, MD  ?Vitamin D, Ergocalciferol, (DRISDOL) 1.25 MG (50000 UNIT) CAPS capsule Take 1 capsule (50,000 Units total) by mouth every 7  (seven) days. ?Patient taking differently: Take 50,000 Units by mouth every 7 (seven) days. Sunday 03/02/21  Yes Danford, Valetta Fuller D, NP  ?metroNIDAZOLE (METROGEL VAGINAL) 0.75 % vaginal gel Place 1 Applicatorful vaginally 2 (two) times daily. Use monthly after the period foe 6 months ?Patient not taking: Reported on 04/13/2021 04/07/21   Shelly Bombard, MD  ?   ? ?Allergies    ?Banana and Cefixime   ? ?Review of Systems   ?Review of Systems  ?Constitutional:  Negative for chills and fever.  ?HENT:  Positive for trouble swallowing. Negative for facial swelling.   ?Eyes:  Negative for photophobia, redness and visual disturbance.  ?Respiratory:  Positive for chest tightness. Negative for cough and shortness of breath.   ?Cardiovascular:  Positive for chest pain. Negative for palpitations.  ?Gastrointestinal:  Positive for vomiting. Negative for abdominal pain and nausea.  ?Endocrine: Negative for polydipsia and polyuria.  ?Genitourinary:  Negative for difficulty urinating and hematuria.  ?Musculoskeletal:  Negative for gait problem and joint swelling.  ?Skin:  Negative for pallor and rash.  ?Neurological:  Negative for syncope and headaches.  ?Psychiatric/Behavioral:  Negative for agitation and confusion.   ? ?Physical Exam ?Updated Vital Signs ?BP 124/81   Pulse 94   Temp 98.9 ?F (37.2 ?C) (Oral)   Resp 17   LMP 03/28/2021   SpO2 97%  ?Physical Exam ?Vitals and nursing note reviewed.  ?Constitutional:   ?   General: She is not in acute distress. ?   Appearance: Normal appearance. She is well-developed. She is obese. She is not ill-appearing or diaphoretic.  ?HENT:  ?   Head: Normocephalic and atraumatic.  ?   Jaw: There is normal jaw occlusion. No trismus or tenderness.  ?   Comments: No angioedema ?   Right Ear: External ear normal.  ?   Left Ear: External ear normal.  ?   Nose: Nose normal.  ?   Mouth/Throat:  ?   Mouth: Mucous membranes are moist.  ?   Pharynx: Oropharynx is clear. Uvula midline. No oropharyngeal  exudate or uvula swelling.  ?Eyes:  ?   General: No scleral icterus.    ?   Right eye: No discharge.     ?   Left eye: No discharge.  ?Neck:  ?   Trachea: Trachea and phonation normal.  ?   Comments: No drooling stridor or trismus ?Cardiovascular:  ?   Rate and Rhythm: Normal rate and regular rhythm.  ?   Pulses: Normal pulses.     ?     Radial pulses are 2+  on the right side and 2+ on the left side.  ?     Posterior tibial pulses are 2+ on the right side and 2+ on the left side.  ?   Heart sounds: Normal heart sounds. No murmur heard. ?Pulmonary:  ?   Effort: Pulmonary effort is normal. No respiratory distress.  ?   Breath sounds: Normal breath sounds. No decreased breath sounds or wheezing.  ?Abdominal:  ?   General: Abdomen is flat.  ?   Tenderness: There is no abdominal tenderness.  ?Musculoskeletal:     ?   General: Normal range of motion.  ?   Cervical back: Full passive range of motion without pain and normal range of motion.  ?   Right lower leg: No edema.  ?   Left lower leg: No edema.  ?Skin: ?   General: Skin is warm and dry.  ?   Capillary Refill: Capillary refill takes less than 2 seconds.  ?   Comments: No rash  ?Neurological:  ?   Mental Status: She is alert.  ?Psychiatric:     ?   Mood and Affect: Mood normal.     ?   Behavior: Behavior normal.  ? ? ?ED Results / Procedures / Treatments   ?Labs ?(all labs ordered are listed, but only abnormal results are displayed) ?Labs Reviewed  ?COMPREHENSIVE METABOLIC PANEL - Abnormal; Notable for the following components:  ?    Result Value  ? Potassium 3.3 (*)   ? All other components within normal limits  ?CBC WITH DIFFERENTIAL/PLATELET - Abnormal; Notable for the following components:  ? WBC 14.5 (*)   ? Hemoglobin 9.4 (*)   ? HCT 30.6 (*)   ? MCV 76.9 (*)   ? MCH 23.6 (*)   ? RDW 17.6 (*)   ? Platelets 482 (*)   ? Neutro Abs 12.8 (*)   ? All other components within normal limits  ?URINALYSIS, ROUTINE W REFLEX MICROSCOPIC - Abnormal; Notable for the following  components:  ? APPearance HAZY (*)   ? Hgb urine dipstick MODERATE (*)   ? Ketones, ur 5 (*)   ? Bacteria, UA RARE (*)   ? All other components within normal limits  ?D-DIMER, QUANTITATIVE - Abnormal; Notable for the

## 2021-04-13 NOTE — ED Provider Triage Note (Signed)
Emergency Medicine Provider Triage Evaluation Note ? ?Debra Horn , a 36 y.o. female  was evaluated in triage.  Pt complains of possibility of allergic reaction.  She received iron infusion earlier this morning.  She states that since that time she has felt short of breath with intermittent throat tightening/swelling, headache.  Attributes her symptoms to her iron infusion.  She reports similar symptoms in 2013 however when she went to the hospital she was diagnosed with bilateral PEs.  Currently not anticoagulated.  On arrival patient is febrile at 103.1 which she was unaware of as well as tachycardic in the 130s. ? ?Review of Systems  ?Positive: + headache, SOB, throat tightening  ?Negative: - rash, wheezing, difficulty swallowing, abd pain ? ?Physical Exam  ?LMP 03/28/2021  ?Gen:   Awake, no distress   ?Resp:  Normal effort. Speaking in full sentences. LCTAB.  ?MSK:   Moves extremities without difficulty  ?Other:  Tachycardic ? ?Medical Decision Making  ?Medically screening exam initiated at 5:52 PM.  Appropriate orders placed.  Catelynn A Laprise was informed that the remainder of the evaluation will be completed by another provider, this initial triage assessment does not replace that evaluation, and the importance of remaining in the ED until their evaluation is complete. ? ?Questionable allergic reaction to iron infusion however pt febrile at this time and tachycardic. Nursing staff made aware pt needs to go back to a room for further eval.  ?  ?Tanda Rockers, PA-C ?04/13/21 1755 ? ?

## 2021-04-13 NOTE — Discharge Instructions (Addendum)
It was a pleasure caring for you today in the emergency department. ? ?Please follow up with your pcp  ? ?Please return to the emergency department for any worsening or worrisome symptoms. ? ?

## 2021-04-13 NOTE — ED Triage Notes (Signed)
Pt reports iron infusion this morning and now experiencing side effects.  ? ?C/o chest tightness and throat swelling.  ? ?Pt able to talk in complete sentences.  ? ? ? ?Hx of PEs denies taking blood thinner.  ?

## 2021-04-14 ENCOUNTER — Telehealth: Payer: Self-pay

## 2021-04-14 NOTE — Telephone Encounter (Signed)
Transition Care Management Unsuccessful Follow-up Telephone Call ? ?Date of discharge and from where:  04/13/2021-[facility] ? ?Attempts:  1st Attempt ? ?Reason for unsuccessful TCM follow-up call:  Left voice message ? ?  ?

## 2021-04-15 ENCOUNTER — Telehealth: Payer: Self-pay

## 2021-04-15 LAB — URINE CULTURE: Culture: <10000 — AB

## 2021-04-15 NOTE — Telephone Encounter (Signed)
Transition Care Management Unsuccessful Follow-up Telephone Call ? ?Date of discharge and from where:  04/13/2021-[facility]  ? ?Attempts:  2nd Attempt ? ?Reason for unsuccessful TCM follow-up call:  Left voice message ? ?  ?

## 2021-04-15 NOTE — Telephone Encounter (Signed)
Pt states she had a reaction after her Venofer insusion and went to the ER. ? ?Pt requests pre-treatment medication before her next infusion. ?

## 2021-04-16 ENCOUNTER — Other Ambulatory Visit: Payer: Self-pay | Admitting: Internal Medicine

## 2021-04-18 LAB — CULTURE, BLOOD (ROUTINE X 2)
Culture: NO GROWTH
Culture: NO GROWTH
Special Requests: ADEQUATE

## 2021-04-18 NOTE — Telephone Encounter (Signed)
Transition Care Management Unsuccessful Follow-up Telephone Call ? ?Date of discharge and from where:  04/13/2021-[facility]  ? ?Attempts:  3rd Attempt ? ?Reason for unsuccessful TCM follow-up call:  Unable to reach patient ? ? ? ?

## 2021-04-20 ENCOUNTER — Ambulatory Visit: Payer: Medicaid Other

## 2021-04-20 MED ORDER — ACETAMINOPHEN 325 MG PO TABS: 650.0000 mg | ORAL_TABLET | ORAL | Status: AC

## 2021-04-20 MED ORDER — DIPHENHYDRAMINE HCL 50 MG/ML IJ SOLN: 25.0000 mg | INTRAMUSCULAR | Status: AC

## 2021-04-20 MED ORDER — SODIUM CHLORIDE 0.9 % IV SOLN
300.0000 mg | INTRAVENOUS | Status: AC
  Filled 2021-04-20: qty 15

## 2021-04-26 ENCOUNTER — Other Ambulatory Visit: Payer: Self-pay | Admitting: Pharmacy Technician

## 2021-04-27 ENCOUNTER — Telehealth: Payer: Self-pay | Admitting: Internal Medicine

## 2021-04-27 ENCOUNTER — Ambulatory Visit: Payer: Medicaid Other

## 2021-04-27 NOTE — Telephone Encounter (Signed)
Called patient regarding upcoming June appointment, left a voicemail. 

## 2021-04-28 ENCOUNTER — Ambulatory Visit (INDEPENDENT_AMBULATORY_CARE_PROVIDER_SITE_OTHER): Payer: BC Managed Care – PPO | Admitting: Adult Health

## 2021-04-28 ENCOUNTER — Encounter (INDEPENDENT_AMBULATORY_CARE_PROVIDER_SITE_OTHER): Payer: Self-pay | Admitting: Adult Health

## 2021-04-28 VITALS — BP 118/78 | HR 69 | Temp 98.3°F | Ht 62.0 in | Wt 253.0 lb

## 2021-04-28 DIAGNOSIS — T7840XD Allergy, unspecified, subsequent encounter: Secondary | ICD-10-CM

## 2021-04-28 DIAGNOSIS — E669 Obesity, unspecified: Secondary | ICD-10-CM

## 2021-04-28 DIAGNOSIS — E559 Vitamin D deficiency, unspecified: Secondary | ICD-10-CM

## 2021-04-28 DIAGNOSIS — E8881 Metabolic syndrome: Secondary | ICD-10-CM

## 2021-04-28 DIAGNOSIS — Z6841 Body Mass Index (BMI) 40.0 and over, adult: Secondary | ICD-10-CM

## 2021-04-28 MED ORDER — VITAMIN D (ERGOCALCIFEROL) 1.25 MG (50000 UNIT) PO CAPS: 50000.0000 [IU] | ORAL_CAPSULE | ORAL | 0 refills | Status: DC

## 2021-04-28 MED ORDER — METFORMIN HCL 500 MG PO TABS
500.0000 mg | ORAL_TABLET | Freq: Every day | ORAL | 0 refills | Status: DC
Start: 2021-04-28 — End: 2021-05-31

## 2021-05-04 ENCOUNTER — Ambulatory Visit: Payer: Medicaid Other

## 2021-05-04 MED ORDER — DIPHENHYDRAMINE HCL 50 MG/ML IJ SOLN: 25.0000 mg | INTRAMUSCULAR | Status: DC

## 2021-05-04 MED ORDER — ACETAMINOPHEN 325 MG PO TABS: 650.0000 mg | ORAL_TABLET | ORAL | Status: DC

## 2021-05-04 MED ORDER — METHYLPREDNISOLONE SODIUM SUCC 125 MG IJ SOLR: 125.0000 mg | Freq: Once | INTRAMUSCULAR | Status: DC

## 2021-05-04 MED ORDER — SODIUM CHLORIDE 0.9 % IV SOLN
300.0000 mg | INTRAVENOUS | Status: DC
  Filled 2021-05-04: qty 15

## 2021-05-10 NOTE — Progress Notes (Signed)
? ? ? ?Chief Complaint:  ? ?OBESITY ?Debra Horn is here to discuss her progress with her obesity treatment plan along with follow-up of her obesity related diagnoses. Debra Horn is on the Category 2 Plan and states she is following her eating plan approximately 75% of the time. Debra Horn states she is doing 0 minutes 0 times per week. ? ?Today's visit was #: 11 ?Starting weight: 244 lbs ?Starting date: 09/15/2020 ?Today's weight: 253 lbs ?Today's date: 04/28/2021 ?Total lbs lost to date: 0 ?Total lbs lost since last in-office visit: 0 ? ?Interim History:  ?Debra Horn experienced an allergic reaction to her last Iron Infusion: She reports airway restriction elevated temp about 4 hrs after infusion was completed. ?She presented to ED for evaluation/tx: ?04/13/2021 Reviewed ED notes/labs/imaging with pt. ? ?She denies any acute sx's at present. ? ?Of note: ?Debra Horn an appointment to establish with PCP-however she was a No show.  ?Strongly encouraged to establish with PCP. ? ?Subjective:  ? ?1. Vitamin D deficiency ?02/04/2021 Vitamin D level 48.9 stable, still below goal.  ?She is on Ergocalciferol-denies nausea, vomiting, and muscle weakness.  ? ?2. Allergic reaction, subsequent encounter ?04/13/2021 ED NOtes: ?Patient with possible iron transfusion related reaction from iron transfusion earlier today.  Possible allergic reaction.  She does not have angioedema.  Exam is not consistent with anaphylaxis.  Her phonation is normal.  No stridor, drooling or trismus.  Respiratory status is stable.  She is speaking clearly in full sentences. ?-Did give Benadryl, Pepcid, steroid.  IV fluids. ?She is found to have fever, tachycardia.  Concern for possible infection although no obvious source.  Sepsis order set was ordered in triage.  ?-D-dimer was elevated, history of prior PE.  Will obtain CT PE. No PE on imaging  ?-The patient's chest pain is not suggestive of pulmonary embolus, cardiac ischemia, aortic dissection, pericarditis, myocarditis,  pulmonary embolism, pneumothorax, pneumonia, Zoster, or esophageal perforation, or other serious etiology.  Historically not abrupt in onset, tearing or ripping, pulses symmetric. EKG nonspecific for ischemia/infarction. No dysrhythmias, brugada, WPW, prolonged QT noted.  ?-Troponin negative x2. CXR reviewed. Labs without demonstration of acute pathology unless otherwise noted above.  ?-Pt with possible adverse reaction from iron transfusion, possibly a mild hypersensitivity reaction. she has experienced this in the past. Given fever and Glenford Peers s/s she may have concurrent viral syndrome but favor this is likely 2/2 iron infusion. Workup in the ED was re-assuring. Viral swabs neg. No evidence of PNA on imaging. She is feeling much better. Resp status is stable. ? ?3. Insulin resistance ?Wegovy denied.  ?Debra Horn remained on Metformin 500 mg then stopped last week of March.  ?She has been on now for 4 weeks.  ? ?Assessment/Plan:  ? ?1. Vitamin D deficiency ?We will refill Vitamin D for 1 month with no refills.  ? ?- Vitamin D, Ergocalciferol, (DRISDOL) 1.25 MG (50000 UNIT) CAPS capsule; Take 1 capsule (50,000 Units total) by mouth every 7 (seven) days.  Dispense: 4 capsule; Refill: 0 ? ?2. Allergic reaction, subsequent encounter ?Debra Horn will follow up with Hematologist.  ?She will established with PCP.  ? ?3. Insulin resistance ?We will refill Metformin 500 mg for 1 month with no refills.  ? ?- metFORMIN (GLUCOPHAGE) 500 MG tablet; Take 1 tablet (500 mg total) by mouth daily with breakfast.  Dispense: 30 tablet; Refill: 0 ? ?4. Obesity with current BMI of 46.3 ?Debra Horn is currently in the action stage of change. As such, her goal is to continue with weight loss  efforts. She has agreed to the Category 2 Plan.  ? ?Established with PCP. ? ?Exercise goals: All adults should avoid inactivity. Some physical activity is better than none, and adults who participate in any amount of physical activity gain some health benefits. Debra Horn  established with a trainer at [employer].  ? ?Behavioral modification strategies: increasing lean protein intake, decreasing simple carbohydrates, meal planning and cooking strategies, keeping healthy foods in the home, and planning for success. ? ?Debra Horn has agreed to follow-up with our clinic in 4 weeks. She was informed of the importance of frequent follow-up visits to maximize her success with intensive lifestyle modifications for her multiple health conditions.  ? ?Objective:  ? ?Blood pressure 118/78, pulse 69, temperature 98.3 ?F (36.8 ?C), height 5\' 2"  (1.575 m), weight 253 lb (114.8 kg), SpO2 99 %. ?Body mass index is 46.27 kg/m?. ? ?General: Cooperative, alert, well developed, in no acute distress. ?HEENT: Conjunctivae and lids unremarkable. ?Cardiovascular: Regular rhythm.  ?Lungs: Normal work of breathing. ?Neurologic: No focal deficits.  ? ?Lab Results  ?Component Value Date  ? CREATININE 0.58 04/13/2021  ? BUN 14 04/13/2021  ? NA 136 04/13/2021  ? K 3.3 (L) 04/13/2021  ? CL 104 04/13/2021  ? CO2 24 04/13/2021  ? ?Lab Results  ?Component Value Date  ? ALT 18 04/13/2021  ? AST 25 04/13/2021  ? ALKPHOS 73 04/13/2021  ? BILITOT 0.8 04/13/2021  ? ?Lab Results  ?Component Value Date  ? HGBA1C 5.6 02/04/2021  ? HGBA1C 5.4 09/16/2020  ? HGBA1C 5.0 02/09/2017  ? HGBA1C 5.6 02/19/2014  ? ?Lab Results  ?Component Value Date  ? INSULIN 17.6 02/04/2021  ? INSULIN 16.1 09/16/2020  ? ?Lab Results  ?Component Value Date  ? TSH 1.520 09/16/2020  ? ?Lab Results  ?Component Value Date  ? CHOL 155 09/16/2020  ? HDL 46 09/16/2020  ? LDLCALC 97 09/16/2020  ? TRIG 62 09/16/2020  ? ?Lab Results  ?Component Value Date  ? VD25OH 48.9 02/04/2021  ? VD25OH 18.1 (L) 09/16/2020  ? VD25OH 8.4 (L) 02/09/2017  ? ?Lab Results  ?Component Value Date  ? WBC 14.5 (H) 04/13/2021  ? HGB 9.4 (L) 04/13/2021  ? HCT 30.6 (L) 04/13/2021  ? MCV 76.9 (L) 04/13/2021  ? PLT 482 (H) 04/13/2021  ? ?Lab Results  ?Component Value Date  ? IRON 19 (L)  03/07/2021  ? TIBC 486 (H) 03/07/2021  ? FERRITIN 6 (L) 03/07/2021  ? ?Attestation Statements:  ? ?Reviewed by clinician on day of visit: allergies, medications, problem list, medical history, surgical history, family history, social history, and previous encounter notes. ? ?Time spent on visit including pre-visit chart review and post-visit care and charting was 32 minutes.  ? ?I, 05/07/2021, RMA, am acting as [place] for Energy manager, NP. ? ?I have reviewed the above documentation for accuracy and completeness, and I agree with the above. -  Lizzette Carbonell d. Kendrix Orman, NP-C ?

## 2021-05-11 ENCOUNTER — Ambulatory Visit: Payer: Medicaid Other

## 2021-05-11 DIAGNOSIS — T7840XA Allergy, unspecified, initial encounter: Secondary | ICD-10-CM | POA: Insufficient documentation

## 2021-05-17 ENCOUNTER — Telehealth: Payer: Self-pay | Admitting: Internal Medicine

## 2021-05-17 NOTE — Telephone Encounter (Signed)
Rescheduled 06/08 appointment time per provider pal, called and left a voicemail. ?

## 2021-05-18 ENCOUNTER — Ambulatory Visit: Payer: Medicaid Other

## 2021-05-20 ENCOUNTER — Other Ambulatory Visit (INDEPENDENT_AMBULATORY_CARE_PROVIDER_SITE_OTHER): Payer: Self-pay | Admitting: Adult Health

## 2021-05-20 DIAGNOSIS — E8881 Metabolic syndrome: Secondary | ICD-10-CM

## 2021-05-31 ENCOUNTER — Encounter (INDEPENDENT_AMBULATORY_CARE_PROVIDER_SITE_OTHER): Payer: Self-pay | Admitting: Adult Health

## 2021-05-31 ENCOUNTER — Telehealth: Payer: Self-pay

## 2021-05-31 ENCOUNTER — Ambulatory Visit (INDEPENDENT_AMBULATORY_CARE_PROVIDER_SITE_OTHER): Payer: BC Managed Care – PPO | Admitting: Adult Health

## 2021-05-31 VITALS — BP 120/75 | HR 78 | Temp 98.7°F | Ht 62.0 in | Wt 256.0 lb

## 2021-05-31 DIAGNOSIS — Z6841 Body Mass Index (BMI) 40.0 and over, adult: Secondary | ICD-10-CM

## 2021-05-31 DIAGNOSIS — E559 Vitamin D deficiency, unspecified: Secondary | ICD-10-CM | POA: Diagnosis not present

## 2021-05-31 DIAGNOSIS — M545 Low back pain, unspecified: Secondary | ICD-10-CM

## 2021-05-31 DIAGNOSIS — M25552 Pain in left hip: Secondary | ICD-10-CM | POA: Diagnosis not present

## 2021-05-31 DIAGNOSIS — M542 Cervicalgia: Secondary | ICD-10-CM

## 2021-05-31 DIAGNOSIS — E669 Obesity, unspecified: Secondary | ICD-10-CM

## 2021-05-31 DIAGNOSIS — E8881 Metabolic syndrome: Secondary | ICD-10-CM | POA: Diagnosis not present

## 2021-05-31 DIAGNOSIS — M25551 Pain in right hip: Secondary | ICD-10-CM

## 2021-05-31 DIAGNOSIS — R52 Pain, unspecified: Secondary | ICD-10-CM | POA: Insufficient documentation

## 2021-05-31 MED ORDER — RYBELSUS 3 MG PO TABS: 3.0000 mg | ORAL_TABLET | Freq: Every day | ORAL | 0 refills | Status: DC

## 2021-05-31 MED ORDER — VITAMIN D (ERGOCALCIFEROL) 1.25 MG (50000 UNIT) PO CAPS: ORAL_CAPSULE | ORAL | 0 refills | Status: DC

## 2021-05-31 MED ORDER — METFORMIN HCL 500 MG PO TABS: ORAL_TABLET | ORAL | 0 refills | Status: DC

## 2021-05-31 NOTE — Telephone Encounter (Signed)
Will close referral for Venofer for last infusion. Have tried to reach patient multiple times with no response.

## 2021-06-01 ENCOUNTER — Other Ambulatory Visit: Payer: Self-pay | Admitting: Internal Medicine

## 2021-06-02 ENCOUNTER — Encounter (INDEPENDENT_AMBULATORY_CARE_PROVIDER_SITE_OTHER): Payer: Self-pay

## 2021-06-02 ENCOUNTER — Telehealth: Payer: BC Managed Care – PPO | Admitting: Physician Assistant

## 2021-06-02 ENCOUNTER — Telehealth (INDEPENDENT_AMBULATORY_CARE_PROVIDER_SITE_OTHER): Payer: Self-pay | Admitting: Adult Health

## 2021-06-02 DIAGNOSIS — J4541 Moderate persistent asthma with (acute) exacerbation: Secondary | ICD-10-CM

## 2021-06-02 NOTE — Telephone Encounter (Signed)
Debra Horn - Prior authorization denied for Rybelsus. Per insurance: Patient does not have type 2 diabetes. Patient sent denial message via mychart.  

## 2021-06-02 NOTE — Progress Notes (Signed)
Chief Complaint:   OBESITY Debra Horn is here to discuss her progress with her obesity treatment plan along with follow-up of her obesity related diagnoses. [name] is on the Category 2 Plan and states she is following her eating plan approximately 75% of the time. [name] states she is not currently exercising due to travel.  Today's visit was #: 12 Starting weight: 244 lbs Starting date: 09/15/2020 Today's weight: 256 lbs Today's date: 05/31/21 Total lbs lost to date: 0 Total lbs lost since last in-office visit: 0  Interim History:  She traveled home to [place] for 5 days.  [place]. She provided the following recall of a "typical intake" for a day: Breakfast-3 eggs, 2 pieces of bacon Lunch-salad (no protein) or tuna Dinner-fish or chicken  Snacks- Protein shakes and/or almonds-not measuring   Subjective:   1. Insulin resistance She takes metformin before bed-will experience nausea. She is VERY frustrated that her insurance will not cover any GLP-1 therapy for Obesity treatment. She has considered withdrawing $1300 from her 31K retirement fund to pay for one month of [facility] therapy.  2. Vitamin D deficiency On ergocalciferol-denies nausea/vomiting/muscle weakness.  3. Diffuse pain Cervical neck-lumbar back pain.  She reports that left side of neck and back hurt worse than right side. She also endorses bilateral hip pain. She feels that her elevated BMI is significantly contributing to overall pain/discomfort.   Assessment/Plan:   1. Insulin resistance Refill - metFORMIN (GLUCOPHAGE) 500 MG tablet; Take daily at dinner  Dispense: 30 tablet; Refill: 0 STRONGLY ADVISE against withdrawing funds from 401K to pay for only month of Wegovy 0.25mg . Recommend that she call and ask her insurance provider if any medications are approved/covered for Obesity treatment.  2. Vitamin D deficiency Check labs at next office visit. Refill  - Vitamin D, Ergocalciferol, (DRISDOL) 1.25 MG (50000 UNIT) CAPS capsule; Take cap every 14 days  Dispense: 4 capsule; Refill: 0  3. Diffuse pain Continue with weight loss efforts, increase activity as tolerated.  4. Obesity, current BMI 46.9 Debra Horn is currently in the action stage of change. As such, her goal is to continue with weight loss efforts. She has agreed to the Category 2 Plan.   1.  90 to 100% plan compliance on Cat 2 Meal Plan. 2.  Track Almond snack calories-   200 snack calories/day 3.  Recommend that she call and ask her insurance provider if any medications are approved/covered for Obesity treatment.  Exercise goals: as is.  Behavioral modification strategies: increasing lean protein intake, decreasing simple carbohydrates, meal planning and cooking strategies, keeping healthy foods in the home, and planning for success.  [name] has agreed to follow-up with our clinic in 4 weeks. She was informed of the importance of frequent follow-up visits to maximize her success with intensive lifestyle modifications for her multiple health conditions.   Objective:   Blood pressure 120/75, pulse 78, temperature 98.7 F (37.1 C), height 5\' 2"  (1.575 m), weight 256 lb (116.1 kg), SpO2 100 %. Body mass index is 46.82 kg/m.  General: Cooperative, alert, well developed, in no acute distress. HEENT: Conjunctivae and lids unremarkable. Cardiovascular: Regular rhythm.  Lungs: Normal work of breathing. Neurologic: No focal deficits.   Lab Results  Component Value Date   CREATININE 0.58 04/13/2021   BUN 14 04/13/2021   NA 136 04/13/2021   K 3.3 (L) 04/13/2021   CL 104 04/13/2021   CO2 24 04/13/2021   Lab  Results  Component Value Date   ALT 18 04/13/2021   AST 25 04/13/2021   ALKPHOS 73 04/13/2021   BILITOT 0.8 04/13/2021   Lab Results  Component Value Date   HGBA1C 5.6 02/04/2021   HGBA1C 5.4 09/16/2020   HGBA1C 5.0 02/09/2017   HGBA1C 5.6 02/19/2014   Lab Results   Component Value Date   INSULIN 17.6 02/04/2021   INSULIN 16.1 09/16/2020   Lab Results  Component Value Date   TSH 1.520 09/16/2020   Lab Results  Component Value Date   CHOL 155 09/16/2020   HDL 46 09/16/2020   LDLCALC 97 09/16/2020   TRIG 62 09/16/2020   Lab Results  Component Value Date   VD25OH 48.9 02/04/2021   VD25OH 18.1 (L) 09/16/2020   VD25OH 8.4 (L) 02/09/2017   Lab Results  Component Value Date   WBC 14.5 (H) 04/13/2021   HGB 9.4 (L) 04/13/2021   HCT 30.6 (L) 04/13/2021   MCV 76.9 (L) 04/13/2021   PLT 482 (H) 04/13/2021   Lab Results  Component Value Date   IRON 19 (L) 03/07/2021   TIBC 486 (H) 03/07/2021   FERRITIN 6 (L) 03/07/2021    Attestation Statements:   Reviewed by clinician on day of visit: allergies, medications, problem list, medical history, surgical history, family history, social history, and previous encounter notes.  I, Jesse Sans, FNP, am acting as Energy manager for William Hamburger, NP.  I have reviewed the above documentation for accuracy and completeness, and I agree with the above. -  Jakorian Marengo d. Mell Guia, NP-C

## 2021-06-03 ENCOUNTER — Ambulatory Visit (INDEPENDENT_AMBULATORY_CARE_PROVIDER_SITE_OTHER): Payer: Medicaid Other | Admitting: Family Medicine

## 2021-06-03 ENCOUNTER — Encounter: Payer: Self-pay | Admitting: Family Medicine

## 2021-06-03 VITALS — BP 132/82 | HR 97 | Temp 98.0°F | Ht 62.0 in | Wt 258.0 lb

## 2021-06-03 DIAGNOSIS — D500 Iron deficiency anemia secondary to blood loss (chronic): Secondary | ICD-10-CM | POA: Diagnosis not present

## 2021-06-03 DIAGNOSIS — Z6841 Body Mass Index (BMI) 40.0 and over, adult: Secondary | ICD-10-CM | POA: Diagnosis not present

## 2021-06-03 DIAGNOSIS — E559 Vitamin D deficiency, unspecified: Secondary | ICD-10-CM | POA: Diagnosis not present

## 2021-06-03 DIAGNOSIS — J4530 Mild persistent asthma, uncomplicated: Secondary | ICD-10-CM

## 2021-06-03 DIAGNOSIS — G4730 Sleep apnea, unspecified: Secondary | ICD-10-CM

## 2021-06-03 MED ORDER — PREDNISONE 20 MG PO TABS: 40.0000 mg | ORAL_TABLET | Freq: Every day | ORAL | 0 refills | Status: DC

## 2021-06-03 NOTE — Assessment & Plan Note (Signed)
She had a telehealth visit yesterday for asthma flare and is currently taking oral steroids.  No acute distress, lungs are clear.  She is also followed by pulmonology for her asthma.

## 2021-06-03 NOTE — Progress Notes (Signed)
Visit for Asthma  Based on what you have shared with me, it looks like you may have a flare up of your asthma.  Asthma is a chronic (ongoing) lung disease which results in airway obstruction, inflammation and hyper-responsiveness.   Asthma symptoms vary from person to person, with common symptoms including nighttime awakening and decreased ability to participate in normal activities as a result of shortness of breath. It is often triggered by changes in weather, changes in the season, changes in air temperature, or inside (home, school, daycare or work) allergens such as animal dander, mold, mildew, woodstoves or cockroaches.   It can also be triggered by hormonal changes, extreme emotion, physical exertion or an upper respiratory tract illness.     It is important to identify the trigger, and then eliminate or avoid the trigger if possible.   If you have been prescribed medications to be taken on a regular basis, it is important to follow the asthma action plan and to follow guidelines to adjust medication in response to increasing symptoms of decreased peak expiratory flow rate  Treatment: I have prescribed: Prednisone 40mg by mouth per day for 5 - 7 days  HOME CARE Only take medications as instructed by your medical team. Consider wearing a mask or scarf to improve breathing air temperature have been shown to decrease irritation and decrease exacerbations Get rest. Taking a steamy shower or using a humidifier may help nasal congestion sand ease sore throat pain. You can place a towel over your head and breathe in the steam from hot water coming from a faucet. Using a saline nasal spray works much the same way.  Cough drops, hare candies and sore throat lozenges may ease your cough.  Avoid close contacts especially the very you and the elderly Cover your mouth if you cough or  sneeze Always remember to wash your hands.    GET HELP RIGHT AWAY IF: You develop worsening symptoms; breathlessness at rest, drowsy, confused or agitated, unable to speak in full sentences You have coughing fits You develop a severe headache or visual changes You develop shortness of breath, difficulty breathing or start having chest pain Your symptoms persist after you have completed your treatment plan If your symptoms do not improve within 10 days  MAKE SURE YOU Understand these instructions. Will watch your condition. Will get help right away if you are not doing well or get worse.   Your e-visit answers were reviewed by a board certified advanced clinical practitioner to complete your personal care plan, Depending upon the condition, your plan could have included both over the counter or prescription medications.   Please review your pharmacy choice. Your safety is important to us. If you have drug allergies check your prescription carefully.  You can use MyChart to ask questions about today's visit, request a non-urgent  call back, or ask for a work or school excuse for 24 hours related to this e-Visit. If it has been greater than 24 hours you will need to follow up with your provider, or enter a new e-Visit to address those concerns.   You will get an e-mail in the next two days asking about your experience. I hope that your e-visit has been valuable and will speed your recovery. Thank you for using e-visits.  I provided 5 minutes of non face-to-face time during this encounter for chart review and documentation.   

## 2021-06-03 NOTE — Progress Notes (Signed)
New Patient Office Visit  Subjective    Patient ID: Debra Horn, female    DOB: 05-11-1985  Age: 36 y.o. MRN: [id number]  CC:  Chief Complaint  Patient presents with   Establish Care    Would like to discuss overall health, she goes to weight management and they told her she should discuss things with a PCP. Recent set of labs were concerning for her     HPI Debra Horn presents to establish care  States she had Covid 2 weeks ago. Continues having symptoms coughing, chest tightness and wheezing.  Started on oral prednisone today. Cough is nonproductive.    Other providers: OB/GYN- Dr. Clearance Coots  Hematologist- Dr. Arbutus Ped  [facility] weight loss clinic  Pulmonologist- Ames Dura, NP   States she has severe OSA and is using CPAP nightly.   Hx of PE in 2013 thought to be related to hormone therapy   Anemia- seeing Dr. Shirline Frees for iron infusions.   Amenorrhea- no period since March and will follow up with Dr. Clearance Coots.   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  Has 2 children.   States she has not been able to get motivated enough to start exercising.         Outpatient Encounter Medications as of 06/03/2021  Medication Sig   albuterol (PROAIR HFA) 108 (90 Base) MCG/ACT inhaler Inhale 2 puffs into the lungs every 6 (six) hours as needed for shortness of breath.   fluticasone (FLONASE) 50 MCG/ACT nasal spray SPRAY 2 SPRAYS INTO EACH NOSTRIL EVERY DAY (Patient taking differently: Place 2 sprays into both nostrils daily.)   fluticasone (FLOVENT HFA) 110 MCG/ACT inhaler Inhale 2 puffs into the lungs in the morning and at bedtime.   folic acid (FOLVITE) 1 MG tablet TAKE 1 TABLET BY MOUTH EVERY DAY   metFORMIN (GLUCOPHAGE) 500 MG tablet Take daily at dinner   metroNIDAZOLE (METROGEL  VAGINAL) 0.75 % vaginal gel Place 1 Applicatorful vaginally 2 (two) times daily. Use monthly after the period foe 6 months   predniSONE (DELTASONE) 20 MG tablet Take 2 tablets (40 mg total) by mouth daily with breakfast.   Vitamin D, Ergocalciferol, (DRISDOL) 1.25 MG (50000 UNIT) CAPS capsule Take cap every 14 days   loratadine (CLARITIN) 10 MG tablet Take 1 tablet (10 mg total) by mouth daily. (Patient not taking: Reported on 06/03/2021)   Semaglutide (RYBELSUS) 3 MG TABS Take 3 mg by mouth daily. (Patient not taking: Reported on 06/03/2021)   No facility-administered encounter medications on file as of 06/03/2021.    Past Medical History:  Diagnosis Date   Anemia    low iron   Asthma    Complication of anesthesia    bp dropped in middle of last c-section and started with n/v   GERD (gastroesophageal reflux disease)    during pregnancy   Headache    "couple days/week" (10/28/2015)   Hyperthyroidism    not on medication   PE (pulmonary embolism) 11/2011   PONV (postoperative nausea and vomiting)    Pregnancy induced hypertension    Preterm labor    Sleep apnea    SOB (shortness of breath)    Thyroid condition    Vitamin D deficiency     Past Surgical History:  Procedure Laterality Date  CESAREAN SECTION  2008; 2014   CESAREAN SECTION N/A 07/27/2017   Procedure: REPEAT CESAREAN SECTION;  Surgeon: Reva Bores, MD;  Location: [facility] BIRTHING SUITES;  Service: Obstetrics;  Laterality: N/A;   FRACTURE SURGERY     ORIF ANKLE FRACTURE Right 10/28/2015   ORIF ANKLE FRACTURE Right 10/28/2015   Procedure: OPEN REDUCTION INTERNAL FIXATION (ORIF) RIGHT ANKLE FRACTURE;  Surgeon: Yolonda Kida, MD;  Location: [facility] OR;  Service: Orthopedics;  Laterality: Right;   TUBAL LIGATION Bilateral 07/27/2017   Procedure: BILATERAL TUBAL LIGATION;  Surgeon: Reva Bores, MD;  Location: [facility] BIRTHING SUITES;  Service: Obstetrics;  Laterality: Bilateral;    Family History  Problem Relation Age of  Onset   Rheum arthritis Mother    Lupus Mother    Arthritis Mother    Hypertension Father    Diabetes Father    Cancer Father    Cancer Sister        skin   Cancer Maternal Grandmother        leukemia   Hypertension Maternal Grandfather    Stroke Maternal Grandfather    Hypertension Paternal Grandmother    Cancer Paternal Grandmother    Hyperlipidemia Paternal Grandmother    Diabetes Paternal Grandmother    Hypothyroidism Maternal Aunt    Lupus Other     Social History   Socioeconomic History   Marital status: Married    Spouse name: Not on file   Number of children: 3   Years of education: 14   Highest education level: Not on file  Occupational History   Occupation: Clinical biochemist  Tobacco Use   Smoking status: Never   Smokeless tobacco: Never  Vaping Use   Vaping Use: Never used  Substance and Sexual Activity   Alcohol use: No   Drug use: No   Sexual activity: Yes    Birth control/protection: None, Surgical  Other Topics Concern   Not on file  Social History Narrative   Born in [place], [place] and raised in [place], [place]. Currently resides in an apartment with her husband and 2 children. No pets. Fun: Work, sleep   Denies religious beliefs that would effect health care.    Social Determinants of Health   Financial Resource Strain: Not on file  Food Insecurity: Not on file  Transportation Needs: Not on file  Physical Activity: Not on file  Stress: Not on file  Social Connections: Not on file  Intimate Partner Violence: Not on file    ROS Pertinent positives and negatives in the history of present illness.      Objective    BP 132/82 (BP Location: Left Arm, Patient Position: Sitting, Cuff Size: Large)   Pulse 97   Temp 98 F (36.7 C) (Temporal)   Ht 5\' 2"  (1.575 m)   Wt 258 lb (117 kg)   LMP 04/02/2021   SpO2 98%   BMI 47.19 kg/m   Physical Exam Constitutional:      General: She is not in acute distress.    Appearance: She is not  ill-appearing.  Eyes:     Conjunctiva/sclera: Conjunctivae normal.     Pupils: Pupils are equal, round, and reactive to light.  Cardiovascular:     Rate and Rhythm: Normal rate and regular rhythm.  Pulmonary:     Effort: Pulmonary effort is normal.     Breath sounds: Normal breath sounds.  Skin:    General: Skin is warm and dry.  Neurological:     General: No  focal deficit present.     Mental Status: She is alert.  Psychiatric:        Mood and Affect: Mood normal.        Behavior: Behavior normal.        Assessment & Plan:   Problem List Items Addressed This Visit       Respiratory   Mild persistent asthma    She had a telehealth visit yesterday for asthma flare and is currently taking oral steroids.  No acute distress, lungs are clear.  She is also followed by pulmonology for her asthma.       Severe sleep apnea - Primary    She is a pleasant 36 year old female who is new to the practice and here to establish care.  She is under the care of several specialists including pulmonology for sleep apnea.  Reports using her CPAP nightly.         Other   BMI 45.0-49.9, adult [facility](HCC)    She is seeing  weight management.       Iron deficiency anemia due to chronic blood loss    Currently receiving iron infusions and being monitored for IDA by Dr. Arbutus PedMohamed.       Vitamin D deficiency     No follow-ups on file.   Debra BlendVickie Eddy Termine, NP-C

## 2021-06-03 NOTE — Assessment & Plan Note (Signed)
Currently receiving iron infusions and being monitored for IDA by Dr. Arbutus Ped.

## 2021-06-03 NOTE — Assessment & Plan Note (Signed)
She is a pleasant 36 year old female who is new to the practice and here to establish care.  She is under the care of several specialists including pulmonology for sleep apnea.  Reports using her CPAP nightly.

## 2021-06-03 NOTE — Assessment & Plan Note (Signed)
She is seeing [place] weight management.

## 2021-06-06 ENCOUNTER — Ambulatory Visit: Payer: Medicaid Other | Admitting: Internal Medicine

## 2021-06-06 ENCOUNTER — Other Ambulatory Visit: Payer: Medicaid Other

## 2021-06-09 ENCOUNTER — Other Ambulatory Visit: Payer: Medicaid Other

## 2021-06-09 ENCOUNTER — Inpatient Hospital Stay: Payer: BC Managed Care – PPO | Admitting: Internal Medicine

## 2021-06-09 ENCOUNTER — Ambulatory Visit: Payer: Medicaid Other | Admitting: Internal Medicine

## 2021-06-09 ENCOUNTER — Inpatient Hospital Stay: Payer: BC Managed Care – PPO | Attending: Physician Assistant

## 2021-06-23 ENCOUNTER — Encounter (INDEPENDENT_AMBULATORY_CARE_PROVIDER_SITE_OTHER): Payer: Self-pay | Admitting: Adult Health

## 2021-06-23 ENCOUNTER — Ambulatory Visit (INDEPENDENT_AMBULATORY_CARE_PROVIDER_SITE_OTHER): Payer: BC Managed Care – PPO | Admitting: Adult Health

## 2021-06-23 VITALS — BP 111/74 | HR 70 | Temp 97.9°F | Ht 62.0 in | Wt 259.0 lb

## 2021-06-23 DIAGNOSIS — D649 Anemia, unspecified: Secondary | ICD-10-CM

## 2021-06-23 DIAGNOSIS — Z6841 Body Mass Index (BMI) 40.0 and over, adult: Secondary | ICD-10-CM | POA: Diagnosis not present

## 2021-06-23 DIAGNOSIS — E559 Vitamin D deficiency, unspecified: Secondary | ICD-10-CM | POA: Diagnosis not present

## 2021-06-23 DIAGNOSIS — E669 Obesity, unspecified: Secondary | ICD-10-CM

## 2021-06-23 MED ORDER — VITAMIN D (ERGOCALCIFEROL) 1.25 MG (50000 UNIT) PO CAPS: ORAL_CAPSULE | ORAL | 0 refills | Status: DC

## 2021-06-25 ENCOUNTER — Other Ambulatory Visit (INDEPENDENT_AMBULATORY_CARE_PROVIDER_SITE_OTHER): Payer: Self-pay | Admitting: Adult Health

## 2021-06-25 DIAGNOSIS — E88819 Insulin resistance, unspecified: Secondary | ICD-10-CM

## 2021-06-25 DIAGNOSIS — E559 Vitamin D deficiency, unspecified: Secondary | ICD-10-CM

## 2021-06-25 DIAGNOSIS — E8881 Metabolic syndrome: Secondary | ICD-10-CM

## 2021-06-27 NOTE — Progress Notes (Signed)
Chief Complaint:   OBESITY Debra Horn is here to discuss her progress with her obesity treatment plan along with follow-up of her obesity related diagnoses. Debra Horn is on the Category 2 Plan and states she is following her eating plan approximately 75% of the time. Debra Horn states she is walking, and at the gym for 30 minutes 7 times per week.  Today's visit was #: 13 Starting weight: 244 lbs Starting date: 09/15/2020 Today's weight: 259 lbs Today's date: 06/23/2021 Total lbs lost to date: 0 Total lbs lost since last in-office visit: 0  Interim History:  On 06/03/2021, Debra Horn establish with PCP/Vickie Suezanne Jacquet, NP-C.   She continues to enjoy almonds-3 to 4 cups/day.   1 cup of almonds=825 calories. 3 to 4 cups x 825= 2475-3300 CALORIES PER DAY in Almond intake.   She provided the following food recall that is typical for a day of intake: Brunch at noon afternoon: salad with protein. Dinner: either fish or chicken.    She started exercising-walking-asthma, no worsening with activity.  Subjective:   1. Vitamin D deficiency On 02/04/2021 vitamin D level was 48.9, stable.   Debra Horn is on Ergocalciferol once every 14 days. She denies N/V/Muscle Weakness.  2. Normocytic anemia Debra Horn has resumed oral iron, and she denies constipation.  Assessment/Plan:   1. Vitamin D deficiency We will refill Ergocalciferol once every 14 days, for 2 months.   - Vitamin D, Ergocalciferol, (DRISDOL) 1.25 MG (50000 UNIT) CAPS capsule; Take cap every 14 days  Dispense: 4 capsule; Refill: 0  2. Normocytic anemia Debra Horn will follow-up with Hematology/Oncology as directed.   3. Obesity, current BMI 47.4 Debra Horn is currently in the action stage of change. As such, her goal is to continue with weight loss efforts. She has agreed to the Category 2 Plan.   Exercise goals: As is.   Behavioral modification strategies: increasing lean protein intake, decreasing simple carbohydrates, meal planning and cooking  strategies, keeping healthy foods in the home, better snacking choices, and planning for success.  Debra Horn has agreed to follow-up with our clinic in 4 weeks. She was informed of the importance of frequent follow-up visits to maximize her success with intensive lifestyle modifications for her multiple health conditions.   Objective:   Blood pressure 111/74, pulse 70, temperature 97.9 F (36.6 C), height 5\' 2"  (1.575 m), weight 259 lb (117.5 kg), last menstrual period 04/02/2021, SpO2 100 %. Body mass index is 47.37 kg/m.  General: Cooperative, alert, well developed, in no acute distress. HEENT: Conjunctivae and lids unremarkable. Cardiovascular: Regular rhythm.  Lungs: Normal work of breathing. Neurologic: No focal deficits.   Lab Results  Component Value Date   CREATININE 0.58 04/13/2021   BUN 14 04/13/2021   NA 136 04/13/2021   K 3.3 (L) 04/13/2021   CL 104 04/13/2021   CO2 24 04/13/2021   Lab Results  Component Value Date   ALT 18 04/13/2021   AST 25 04/13/2021   ALKPHOS 73 04/13/2021   BILITOT 0.8 04/13/2021   Lab Results  Component Value Date   HGBA1C 5.6 02/04/2021   HGBA1C 5.4 09/16/2020   HGBA1C 5.0 02/09/2017   HGBA1C 5.6 02/19/2014   Lab Results  Component Value Date   INSULIN 17.6 02/04/2021   INSULIN 16.1 09/16/2020   Lab Results  Component Value Date   TSH 1.520 09/16/2020   Lab Results  Component Value Date   CHOL 155 09/16/2020   HDL 46 09/16/2020   LDLCALC 97 09/16/2020   TRIG  62 09/16/2020   Lab Results  Component Value Date   VD25OH 48.9 02/04/2021   VD25OH 18.1 (L) 09/16/2020   VD25OH 8.4 (L) 02/09/2017   Lab Results  Component Value Date   WBC 14.5 (H) 04/13/2021   HGB 9.4 (L) 04/13/2021   HCT 30.6 (L) 04/13/2021   MCV 76.9 (L) 04/13/2021   PLT 482 (H) 04/13/2021   Lab Results  Component Value Date   IRON 19 (L) 03/07/2021   TIBC 486 (H) 03/07/2021   FERRITIN 6 (L) 03/07/2021   Attestation Statements:   Reviewed by  clinician on day of visit: allergies, medications, problem list, medical history, surgical history, family history, social history, and previous encounter notes.   Trude Mcburney, am acting as transcriptionist for William Hamburger, NP.  I have reviewed the above documentation for accuracy and completeness, and I agree with the above. -  Sophia Sperry d. Perri Aragones, NP-C

## 2021-07-13 DIAGNOSIS — G4733 Obstructive sleep apnea (adult) (pediatric): Secondary | ICD-10-CM | POA: Diagnosis not present

## 2021-07-18 ENCOUNTER — Other Ambulatory Visit (INDEPENDENT_AMBULATORY_CARE_PROVIDER_SITE_OTHER): Payer: Self-pay | Admitting: Adult Health

## 2021-07-18 DIAGNOSIS — E559 Vitamin D deficiency, unspecified: Secondary | ICD-10-CM

## 2021-07-24 ENCOUNTER — Telehealth: Payer: BC Managed Care – PPO | Admitting: Nurse Practitioner

## 2021-07-24 DIAGNOSIS — J4541 Moderate persistent asthma with (acute) exacerbation: Secondary | ICD-10-CM | POA: Diagnosis not present

## 2021-07-24 MED ORDER — ALBUTEROL SULFATE HFA 108 (90 BASE) MCG/ACT IN AERS: 2.0000 | INHALATION_SPRAY | Freq: Four times a day (QID) | RESPIRATORY_TRACT | 5 refills | Status: DC | PRN

## 2021-07-24 MED ORDER — PREDNISONE 20 MG PO TABS: 40.0000 mg | ORAL_TABLET | Freq: Every day | ORAL | 0 refills | Status: DC

## 2021-07-24 NOTE — Progress Notes (Signed)
I have spent 5 minutes in review of e-visit questionnaire, review and updating patient chart, medical decision making and response to patient.  ° °Debra Horn W Tyler Cubit, NP ° °  °

## 2021-07-24 NOTE — Progress Notes (Signed)
Visit for Asthma  Based on what you have shared with me, it looks like you may have a flare up of your asthma.  Asthma is a chronic (ongoing) lung disease which results in airway obstruction, inflammation and hyper-responsiveness.   Asthma symptoms vary from person to person, with common symptoms including nighttime awakening and decreased ability to participate in normal activities as a result of shortness of breath. It is often triggered by changes in weather, changes in the season, changes in air temperature, or inside (home, school, daycare or work) allergens such as animal dander, mold, mildew, woodstoves or cockroaches.   It can also be triggered by hormonal changes, extreme emotion, physical exertion or an upper respiratory tract illness.     It is important to identify the trigger, and then eliminate or avoid the trigger if possible.   If you have been prescribed medications to be taken on a regular basis, it is important to follow the asthma action plan and to follow guidelines to adjust medication in response to increasing symptoms of decreased peak expiratory flow rate  Treatment: I have prescribed: Prednisone 40mg  by mouth per day for 5 - 7 days and refilled your proair  HOME CARE Only take medications as instructed by your medical team. Consider wearing a mask or scarf to improve breathing air temperature have been shown to decrease irritation and decrease exacerbations Get rest. Taking a steamy shower or using a humidifier may help nasal congestion sand ease sore throat pain. You can place a towel over your head and breathe in the steam from hot water coming from a faucet. Using a saline nasal spray works much the same way.  Cough drops, hare candies and sore throat lozenges may ease your cough.  Avoid close contacts especially the very you and the elderly Cover your  mouth if you cough or sneeze Always remember to wash your hands.    GET HELP RIGHT AWAY IF: You develop worsening symptoms; breathlessness at rest, drowsy, confused or agitated, unable to speak in full sentences You have coughing fits You develop a severe headache or visual changes You develop shortness of breath, difficulty breathing or start having chest pain Your symptoms persist after you have completed your treatment plan If your symptoms do not improve within 10 days  MAKE SURE YOU Understand these instructions. Will watch your condition. Will get help right away if you are not doing well or get worse.   Your e-visit answers were reviewed by a board certified advanced clinical practitioner to complete your personal care plan, Depending upon the condition, your plan could have included both over the counter or prescription medications.   Please review your pharmacy choice. Your safety is important to . If you have drug allergies check your prescription carefully.  You can use MyChart to ask questions about today's visit, request a non-urgent  call back, or ask for a work or school excuse for 24 hours related to this e-Visit. If it has been greater than 24 hours you will need to follow up with your provider, or enter a new e-Visit to address those concerns.   You will get an e-mail in the next two days asking about your experience. I hope that your e-visit has been valuable and will speed your recovery. Thank you for using e-visits.

## 2021-07-25 ENCOUNTER — Ambulatory Visit (INDEPENDENT_AMBULATORY_CARE_PROVIDER_SITE_OTHER): Payer: BC Managed Care – PPO | Admitting: Adult Health

## 2021-07-25 ENCOUNTER — Encounter (INDEPENDENT_AMBULATORY_CARE_PROVIDER_SITE_OTHER): Payer: Self-pay | Admitting: Adult Health

## 2021-07-25 VITALS — BP 121/78 | HR 96 | Temp 98.7°F | Ht 62.0 in | Wt 262.0 lb

## 2021-07-25 DIAGNOSIS — E559 Vitamin D deficiency, unspecified: Secondary | ICD-10-CM | POA: Diagnosis not present

## 2021-07-25 DIAGNOSIS — E8881 Metabolic syndrome: Secondary | ICD-10-CM

## 2021-07-25 DIAGNOSIS — E669 Obesity, unspecified: Secondary | ICD-10-CM | POA: Diagnosis not present

## 2021-07-25 DIAGNOSIS — Z6841 Body Mass Index (BMI) 40.0 and over, adult: Secondary | ICD-10-CM

## 2021-07-25 DIAGNOSIS — J4541 Moderate persistent asthma with (acute) exacerbation: Secondary | ICD-10-CM

## 2021-07-25 MED ORDER — METFORMIN HCL 500 MG PO TABS: ORAL_TABLET | ORAL | 0 refills | Status: DC

## 2021-07-25 MED ORDER — VITAMIN D (ERGOCALCIFEROL) 1.25 MG (50000 UNIT) PO CAPS: ORAL_CAPSULE | ORAL | 0 refills | Status: DC

## 2021-07-27 NOTE — Progress Notes (Unsigned)
Chief Complaint:   OBESITY Debra Horn is here to discuss her progress with her obesity treatment plan along with follow-up of her obesity related diagnoses. Debra Horn is on {MWMwtlossportion/plan2:23431} and states she is following her eating plan approximately ***% of the time. Debra Horn states she is *** *** minutes *** times per week.  Today's visit was #: *** Starting weight: *** Starting date: *** Today's weight: *** Today's date: 07/25/2021 Total lbs lost to date: *** Total lbs lost since last in-office visit: ***  Interim History: ***  Subjective:   1. Moderate persistent asthma with exacerbation ***  2. Insulin resistance ***  3. Vitamin D deficiency ***  Assessment/Plan:   1. Moderate persistent asthma with exacerbation ***  2. Insulin resistance *** - metFORMIN (GLUCOPHAGE) 500 MG tablet; Take daily at dinner  Dispense: 30 tablet; Refill: 0  3. Vitamin D deficiency *** - Vitamin D, Ergocalciferol, (DRISDOL) 1.25 MG (50000 UNIT) CAPS capsule; Take cap every 14 days  Dispense: 4 capsule; Refill: 0  4. Obesity, current BMI 48.0 Debra Horn {CHL AMB IS/IS NOT:210130109} currently in the action stage of change. As such, her goal is to {MWMwtloss#1:210800005}. She has agreed to {MWMwtlossportion/plan2:23431}.   Exercise goals: {MWM EXERCISE RECS:23473}  Behavioral modification strategies: {MWMwtlossdietstrategies3:23432}.  Debra Horn has agreed to follow-up with our clinic in {NUMBER 1-10:22536} weeks. She was informed of the importance of frequent follow-up visits to maximize her success with intensive lifestyle modifications for her multiple health conditions.   Objective:   Blood pressure 121/78, pulse 96, temperature 98.7 F (37.1 C), height 5\' 2"  (1.575 m), weight 262 lb (118.8 kg), SpO2 99 %. Body mass index is 47.92 kg/m.  General: Cooperative, alert, well developed, in no acute distress. HEENT: Conjunctivae and lids unremarkable. Cardiovascular: Regular  rhythm.  Lungs: Normal work of breathing. Neurologic: No focal deficits.   Lab Results  Component Value Date   CREATININE 0.58 04/13/2021   BUN 14 04/13/2021   NA 136 04/13/2021   K 3.3 (L) 04/13/2021   CL 104 04/13/2021   CO2 24 04/13/2021   Lab Results  Component Value Date   ALT 18 04/13/2021   AST 25 04/13/2021   ALKPHOS 73 04/13/2021   BILITOT 0.8 04/13/2021   Lab Results  Component Value Date   HGBA1C 5.6 02/04/2021   HGBA1C 5.4 09/16/2020   HGBA1C 5.0 02/09/2017   HGBA1C 5.6 02/19/2014   Lab Results  Component Value Date   INSULIN 17.6 02/04/2021   INSULIN 16.1 09/16/2020   Lab Results  Component Value Date   TSH 1.520 09/16/2020   Lab Results  Component Value Date   CHOL 155 09/16/2020   HDL 46 09/16/2020   LDLCALC 97 09/16/2020   TRIG 62 09/16/2020   Lab Results  Component Value Date   VD25OH 48.9 02/04/2021   VD25OH 18.1 (L) 09/16/2020   VD25OH 8.4 (L) 02/09/2017   Lab Results  Component Value Date   WBC 14.5 (H) 04/13/2021   HGB 9.4 (L) 04/13/2021   HCT 30.6 (L) 04/13/2021   MCV 76.9 (L) 04/13/2021   PLT 482 (H) 04/13/2021   Lab Results  Component Value Date   IRON 19 (L) 03/07/2021   TIBC 486 (H) 03/07/2021   FERRITIN 6 (L) 03/07/2021   Attestation Statements:   Reviewed by clinician on day of visit: allergies, medications, problem list, medical history, surgical history, family history, social history, and previous encounter notes.  05/07/2021, am acting as transcriptionist for Trude Mcburney, NP.  I have reviewed the above documentation for accuracy and completeness, and I agree with the above. -  ***

## 2021-07-28 DIAGNOSIS — J4541 Moderate persistent asthma with (acute) exacerbation: Secondary | ICD-10-CM | POA: Insufficient documentation

## 2021-08-05 ENCOUNTER — Ambulatory Visit: Payer: Medicaid Other | Admitting: Family Medicine

## 2021-08-10 ENCOUNTER — Encounter (INDEPENDENT_AMBULATORY_CARE_PROVIDER_SITE_OTHER): Payer: Self-pay

## 2021-08-14 ENCOUNTER — Other Ambulatory Visit (INDEPENDENT_AMBULATORY_CARE_PROVIDER_SITE_OTHER): Payer: Self-pay | Admitting: Adult Health

## 2021-08-14 DIAGNOSIS — E559 Vitamin D deficiency, unspecified: Secondary | ICD-10-CM

## 2021-08-16 ENCOUNTER — Other Ambulatory Visit (INDEPENDENT_AMBULATORY_CARE_PROVIDER_SITE_OTHER): Payer: Self-pay | Admitting: Adult Health

## 2021-08-16 DIAGNOSIS — E8881 Metabolic syndrome: Secondary | ICD-10-CM

## 2021-08-18 ENCOUNTER — Encounter: Payer: Self-pay | Admitting: Family Medicine

## 2021-08-18 ENCOUNTER — Ambulatory Visit (INDEPENDENT_AMBULATORY_CARE_PROVIDER_SITE_OTHER): Payer: BC Managed Care – PPO

## 2021-08-18 ENCOUNTER — Ambulatory Visit (INDEPENDENT_AMBULATORY_CARE_PROVIDER_SITE_OTHER): Payer: BC Managed Care – PPO | Admitting: Family Medicine

## 2021-08-18 VITALS — BP 108/72 | HR 80 | Temp 97.6°F | Ht 62.0 in | Wt 261.0 lb

## 2021-08-18 DIAGNOSIS — R0789 Other chest pain: Secondary | ICD-10-CM | POA: Diagnosis not present

## 2021-08-18 DIAGNOSIS — G4730 Sleep apnea, unspecified: Secondary | ICD-10-CM

## 2021-08-18 DIAGNOSIS — Z86711 Personal history of pulmonary embolism: Secondary | ICD-10-CM | POA: Diagnosis not present

## 2021-08-18 DIAGNOSIS — R0602 Shortness of breath: Secondary | ICD-10-CM | POA: Diagnosis not present

## 2021-08-18 DIAGNOSIS — R7982 Elevated C-reactive protein (CRP): Secondary | ICD-10-CM

## 2021-08-18 DIAGNOSIS — D500 Iron deficiency anemia secondary to blood loss (chronic): Secondary | ICD-10-CM

## 2021-08-18 DIAGNOSIS — J4530 Mild persistent asthma, uncomplicated: Secondary | ICD-10-CM

## 2021-08-18 DIAGNOSIS — T7840XA Allergy, unspecified, initial encounter: Secondary | ICD-10-CM

## 2021-08-18 DIAGNOSIS — R700 Elevated erythrocyte sedimentation rate: Secondary | ICD-10-CM

## 2021-08-18 DIAGNOSIS — R079 Chest pain, unspecified: Secondary | ICD-10-CM | POA: Diagnosis not present

## 2021-08-18 LAB — COMPREHENSIVE METABOLIC PANEL
ALT: 12 U/L (ref 0–35)
AST: 9 U/L (ref 0–37)
Albumin: 4 g/dL (ref 3.5–5.2)
Alkaline Phosphatase: 75 U/L (ref 39–117)
BUN: 13 mg/dL (ref 6–23)
CO2: 27 mEq/L (ref 19–32)
Calcium: 9.2 mg/dL (ref 8.4–10.5)
Chloride: 105 mEq/L (ref 96–112)
Creatinine, Ser: 0.74 mg/dL (ref 0.40–1.20)
GFR: 104.35 mL/min (ref >60.00)
Glucose, Bld: 87 mg/dL (ref 70–99)
Potassium: 3.6 mEq/L (ref 3.5–5.1)
Sodium: 140 mEq/L (ref 135–145)
Total Bilirubin: 0.5 mg/dL (ref 0.2–1.2)
Total Protein: 7.8 g/dL (ref 6.0–8.3)

## 2021-08-18 LAB — BRAIN NATRIURETIC PEPTIDE: Pro B Natriuretic peptide (BNP): 14 pg/mL (ref 0.0–100.0)

## 2021-08-18 LAB — CBC WITH DIFFERENTIAL/PLATELET
Basophils Absolute: 0.1 10*3/uL (ref 0.0–0.1)
Basophils Relative: 1 % (ref 0.0–3.0)
Eosinophils Absolute: 0.1 10*3/uL (ref 0.0–0.7)
Eosinophils Relative: 1.5 % (ref 0.0–5.0)
HCT: 35.1 % — ABNORMAL LOW (ref 36.0–46.0)
Hemoglobin: 11 g/dL — ABNORMAL LOW (ref 12.0–15.0)
Lymphocytes Relative: 45.8 % (ref 12.0–46.0)
Lymphs Abs: 3.8 10*3/uL (ref 0.7–4.0)
MCHC: 31.3 g/dL (ref 30.0–36.0)
MCV: 83.4 fl (ref 78.0–100.0)
Monocytes Absolute: 0.7 10*3/uL (ref 0.1–1.0)
Monocytes Relative: 8.4 % (ref 3.0–12.0)
Neutro Abs: 3.6 10*3/uL (ref 1.4–7.7)
Neutrophils Relative %: 43.3 % (ref 43.0–77.0)
Platelets: 420 10*3/uL — ABNORMAL HIGH (ref 150.0–400.0)
RBC: 4.2 Mil/uL (ref 3.87–5.11)
RDW: 17.8 % — ABNORMAL HIGH (ref 11.5–15.5)
WBC: 8.3 10*3/uL (ref 4.0–10.5)

## 2021-08-18 LAB — D-DIMER, QUANTITATIVE: D-Dimer, Quant: 0.36 mcg/mL FEU (ref <0.50)

## 2021-08-18 LAB — TROPONIN I (HIGH SENSITIVITY): High Sens Troponin I: <2 ng/L (ref 2–17)

## 2021-08-18 LAB — C-REACTIVE PROTEIN: CRP: 1.8 mg/dL (ref 0.5–20.0)

## 2021-08-18 LAB — TSH: TSH: 1.68 u[IU]/mL (ref 0.35–5.50)

## 2021-08-18 LAB — SEDIMENTATION RATE: Sed Rate: 116 mm/hr — ABNORMAL HIGH (ref 0–20)

## 2021-08-18 NOTE — Patient Instructions (Signed)
Continue on your current medications and please call and schedule with your pulmonologist.  Go downstairs for labs and chest x-ray  I am referring you to an allergist and they will call you to schedule a visit.

## 2021-08-18 NOTE — Progress Notes (Signed)
Subjective:     Patient ID: Debra Horn, female    DOB: 12-30-1985, 36 y.o.   MRN: [id number]  Chief Complaint  Patient presents with   Shortness of Breath    Having a hard time breathing again, had COVID in May and it has been since then. Even sitting she can get SOB. Has been using her inhaler which sometimes gives relief and sometimes doesn't. Chest feels "heavy"   Allergic Reaction    Was outside Saturday and out of the blue she started to get red and swell all over, developed a rash and felt hot.     HPI Patient is in today for persistent shortness of breath and chest pain. Underlying asthma. Is under the care of pulmonologist for asthma and severe OSA.   Hx of PE (2013) and recent Covid infection (May 2023).  Hx of IDA as elevated CRP and sed rates in the past.   States she had what seemed to be an allergic reaction over the weekend which made her have diffuse swelling. Unknown trigger. She was sitting outside in the heat for 2-3 hours before onset. Denies having any tongue, lip or throat edema. She took Benadryl. Swelling resolved.   Denies fever, chills, dizziness, headache, URI symptoms, palpitations, abdominal pain, N/V/D, urinary symptoms, LE edema.    There are no preventive care reminders to display for this patient.   Past Medical History:  Diagnosis Date   Anemia    low iron   Asthma    Complication of anesthesia    bp dropped in middle of last c-section and started with n/v   GERD (gastroesophageal reflux disease)    during pregnancy   Headache    "couple days/week" (10/28/2015)   Hyperthyroidism    not on medication   PE (pulmonary embolism) 11/2011   PONV (postoperative nausea and vomiting)    Pregnancy induced hypertension    Preterm labor    Sleep apnea    SOB (shortness of breath)    Thyroid condition    Vitamin D deficiency     Past Surgical History:  Procedure Laterality Date   CESAREAN SECTION  2008; 2014   CESAREAN SECTION N/A  07/27/2017   Procedure: REPEAT CESAREAN SECTION;  Surgeon: Reva Bores, MD;  Location: [facility] BIRTHING SUITES;  Service: Obstetrics;  Laterality: N/A;   FRACTURE SURGERY     ORIF ANKLE FRACTURE Right 10/28/2015   ORIF ANKLE FRACTURE Right 10/28/2015   Procedure: OPEN REDUCTION INTERNAL FIXATION (ORIF) RIGHT ANKLE FRACTURE;  Surgeon: Yolonda Kida, MD;  Location: [facility] OR;  Service: Orthopedics;  Laterality: Right;   TUBAL LIGATION Bilateral 07/27/2017   Procedure: BILATERAL TUBAL LIGATION;  Surgeon: Reva Bores, MD;  Location: [facility] BIRTHING SUITES;  Service: Obstetrics;  Laterality: Bilateral;    Family History  Problem Relation Age of Onset   Rheum arthritis Mother    Lupus Mother    Arthritis Mother    Hypertension Father    Diabetes Father    Cancer Father    Cancer Sister        skin   Cancer Maternal Grandmother        leukemia   Hypertension Maternal Grandfather    Stroke Maternal Grandfather    Hypertension Paternal Grandmother    Cancer Paternal Grandmother    Hyperlipidemia Paternal Grandmother    Diabetes Paternal Grandmother    Hypothyroidism Maternal Aunt    Lupus Other     Social History   Socioeconomic  History   Marital status: Married    Spouse name: Not on file   Number of children: 3   Years of education: 14   Highest education level: Not on file  Occupational History   Occupation: Customer Service  Tobacco Use   Smoking status: Never   Smokeless tobacco: Never  Vaping Use   Vaping Use: Never used  Substance and Sexual Activity   Alcohol use: No   Drug use: No   Sexual activity: Yes    Birth control/protection: None, Surgical  Other Topics Concern   Not on file  Social History Narrative   Born in [place], [place] and raised in [place], [place]. Currently resides in an apartment with her husband and 2 children. No pets. Fun: Work, sleep   Denies religious beliefs that would effect health care.    Social Determinants of Health   Financial  Resource Strain: Not on file  Food Insecurity: Not on file  Transportation Needs: Not on file  Physical Activity: Not on file  Stress: Not on file  Social Connections: Not on file  Intimate Partner Violence: Not on file    Outpatient Medications Prior to Visit  Medication Sig Dispense Refill   albuterol (PROAIR HFA) 108 (90 Base) MCG/ACT inhaler Inhale 2 puffs into the lungs every 6 (six) hours as needed for shortness of breath. 1 each 5   fluticasone (FLONASE) 50 MCG/ACT nasal spray SPRAY 2 SPRAYS INTO EACH NOSTRIL EVERY DAY (Patient taking differently: Place 2 sprays into both nostrils daily.) 16 mL 0   fluticasone (FLOVENT HFA) 110 MCG/ACT inhaler Inhale 2 puffs into the lungs in the morning and at bedtime. 1 each 5   folic acid (FOLVITE) 1 MG tablet TAKE 1 TABLET BY MOUTH EVERY DAY 90 tablet 1   loratadine (CLARITIN) 10 MG tablet Take 1 tablet (10 mg total) by mouth daily. 30 tablet 11   metFORMIN (GLUCOPHAGE) 500 MG tablet Take daily at dinner 30 tablet 0   metroNIDAZOLE (METROGEL VAGINAL) 0.75 % vaginal gel Place 1 Applicatorful vaginally 2 (two) times daily. Use monthly after the period foe 6 months 70 g 5   Vitamin D, Ergocalciferol, (DRISDOL) 1.25 MG (50000 UNIT) CAPS capsule Take cap every 14 days 4 capsule 0   predniSONE (DELTASONE) 20 MG tablet Take 2 tablets (40 mg total) by mouth daily with breakfast. 14 tablet 0   No facility-administered medications prior to visit.    Allergies  Allergen Reactions   Banana Anaphylaxis   Cefixime Rash    ROS     Objective:    Physical Exam Constitutional:      General: She is not in acute distress.    Appearance: She is not ill-appearing.  HENT:     Mouth/Throat:     Mouth: Mucous membranes are moist.     Pharynx: Oropharynx is clear.  Eyes:     Extraocular Movements: Extraocular movements intact.     Pupils: Pupils are equal, round, and reactive to light.  Neck:     Vascular: No JVD.  Cardiovascular:     Rate and  Rhythm: Normal rate and regular rhythm.  Pulmonary:     Effort: Pulmonary effort is normal.     Breath sounds: Normal breath sounds.  Musculoskeletal:        General: Normal range of motion.     Cervical back: Normal range of motion and neck supple.  Lymphadenopathy:     Cervical: No cervical adenopathy.  Skin:    General:  Skin is warm and dry.  Neurological:     General: No focal deficit present.     Mental Status: She is alert and oriented to person, place, and time.     Cranial Nerves: No cranial nerve deficit.     Motor: No weakness.  Psychiatric:        Mood and Affect: Mood normal.        Behavior: Behavior normal.     BP 108/72 (BP Location: Left Arm, Patient Position: Sitting, Cuff Size: Large)   Pulse 80   Temp 97.6 F (36.4 C) (Temporal)   Ht 5\' 2"  (1.575 m)   Wt 261 lb (118.4 kg)   LMP 08/15/2021   SpO2 98%   BMI 47.74 kg/m  Wt Readings from Last 3 Encounters:  08/18/21 261 lb (118.4 kg)  07/25/21 262 lb (118.8 kg)  06/23/21 259 lb (117.5 kg)       Assessment & Plan:   Problem List Items Addressed This Visit       Respiratory   Mild persistent asthma    No acute distress. Continue daily medications and follow up with pulmonologist. Referral to allergist due to recent presumed allergic reaction.       Relevant Orders   Ambulatory referral to Allergy   Severe sleep apnea    Followed by pulmonologist. Continue CPAP        Other   Allergic reaction    Unclear trigger. Back to baseline. No anaphylaxis. Referral to allergist.       Relevant Orders   Ambulatory referral to Allergy   CRP elevated    Recheck and follow up      Relevant Orders   C-reactive protein (Completed)   Elevated sed rate   Relevant Orders   Sedimentation rate (Completed)   History of pulmonary embolus (PE)    D-dimer ordered. Less likely etiology for ongoing symptoms.       Relevant Orders   D-Dimer, Quantitative (Completed)   EKG 12-Lead   Iron deficiency anemia  due to chronic blood loss    Currently receiving iron infusions. Check labs       Relevant Orders   Comprehensive metabolic panel (Completed)   Other chest pain - Primary    Ongoing. EKG benign. She has been evaluated by cardiologist for same. Check labs including Troponin, D-dimer, CRP and sed rate. Chest XR ordered. Follow up pending results.       Relevant Orders   Comprehensive metabolic panel (Completed)   CBC with Differential/Platelet (Completed)   TSH (Completed)   Sedimentation rate (Completed)   C-reactive protein (Completed)   DG Chest 2 View (Completed)   Troponin I (High Sensitivity) (Completed)   D-Dimer, Quantitative (Completed)   EKG 12-Lead   Shortness of breath    Ongoing. No acute distress. Euvolemic.  Continue with allergy and asthma treatment. Chest XR and labs ordered. Follow up with pulmonologist       Relevant Orders   TSH (Completed)   Sedimentation rate (Completed)   C-reactive protein (Completed)   Brain natriuretic peptide (Completed)   DG Chest 2 View (Completed)   Troponin I (High Sensitivity) (Completed)   D-Dimer, Quantitative (Completed)   EKG 12-Lead    I have discontinued Yania A. Tsao's predniSONE. I am also having her maintain her fluticasone, fluticasone, loratadine, metroNIDAZOLE, folic acid, albuterol, metFORMIN, and Vitamin D (Ergocalciferol).  No orders of the defined types were placed in this encounter.

## 2021-08-22 DIAGNOSIS — R0602 Shortness of breath: Secondary | ICD-10-CM | POA: Insufficient documentation

## 2021-08-22 NOTE — Assessment & Plan Note (Signed)
Recheck and follow up

## 2021-08-22 NOTE — Assessment & Plan Note (Addendum)
Ongoing. No acute distress. Euvolemic.  Continue with allergy and asthma treatment. Chest XR and labs ordered. Follow up with pulmonologist

## 2021-08-22 NOTE — Assessment & Plan Note (Signed)
Unclear trigger. Back to baseline. No anaphylaxis. Referral to allergist.

## 2021-08-22 NOTE — Assessment & Plan Note (Signed)
No acute distress. Continue daily medications and follow up with pulmonologist. Referral to allergist due to recent presumed allergic reaction.

## 2021-08-22 NOTE — Assessment & Plan Note (Signed)
D-dimer ordered. Less likely etiology for ongoing symptoms.

## 2021-08-22 NOTE — Assessment & Plan Note (Signed)
Followed by pulmonologist. Continue CPAP

## 2021-08-22 NOTE — Assessment & Plan Note (Signed)
Currently receiving iron infusions. Check labs

## 2021-08-22 NOTE — Assessment & Plan Note (Signed)
Ongoing. EKG benign. She has been evaluated by cardiologist for same. Check labs including Troponin, D-dimer, CRP and sed rate. Chest XR ordered. Follow up pending results.

## 2021-08-23 ENCOUNTER — Ambulatory Visit (INDEPENDENT_AMBULATORY_CARE_PROVIDER_SITE_OTHER): Payer: BC Managed Care – PPO | Admitting: Adult Health

## 2021-08-23 ENCOUNTER — Encounter (INDEPENDENT_AMBULATORY_CARE_PROVIDER_SITE_OTHER): Payer: Self-pay | Admitting: Adult Health

## 2021-08-23 VITALS — BP 131/84 | HR 70 | Temp 98.6°F | Ht 62.0 in | Wt 260.0 lb

## 2021-08-23 DIAGNOSIS — E669 Obesity, unspecified: Secondary | ICD-10-CM | POA: Diagnosis not present

## 2021-08-23 DIAGNOSIS — R7301 Impaired fasting glucose: Secondary | ICD-10-CM

## 2021-08-23 DIAGNOSIS — Z6841 Body Mass Index (BMI) 40.0 and over, adult: Secondary | ICD-10-CM

## 2021-08-26 MED ORDER — RYBELSUS 3 MG PO TABS: 3.0000 mg | ORAL_TABLET | Freq: Every day | ORAL | 0 refills | Status: DC

## 2021-08-26 NOTE — Progress Notes (Unsigned)
Chief Complaint:   OBESITY Debra Horn is here to discuss her progress with her obesity treatment plan along with follow-up of her obesity related diagnoses. Debra Horn is on the Category 2 Plan and states she is following her eating plan approximately 80% of the time. Debra Horn states she is walking 30 minutes 5 times per week.  Today's visit was #: 15 Starting weight: 244 lbs Starting date: 09/15/2020 Today's weight: 260 lbs Today's date:  08/23/2021 Total lbs lost to date: 0 Total lbs lost since last in-office visit: 2 lbs  Interim History: Her children (15, at [employer], 9 at [employer], and 4)The older children return to school next Monday.  Her 36 year old remains at home with mom and dad, they are both remotely.  She consumed "given smoothies" all day last Saturday with sister in law.   Subjective:   1. Impaired fasting glucose 02/04/2021:  average glucose estimated at 114-well above goal of 99.  Her father has type 2 diabetes mellitus, it is being treated with medication.  She denies family history of MTC, or MENS 2.  She denies personal history of pancreatitis.  Assessment/Plan:   1. Impaired fasting glucose Start- Semaglutide (RYBELSUS) 3 MG TABS; Take 3 mg by mouth daily.  Dispense: 30 tablet; Refill: 0  2. Obesity, current BMI 47.6 Debra Horn is currently in the action stage of change. As such, her goal is to continue with weight loss efforts. She has agreed to the Category 2 Plan.   Exercise goals:  As is.   Behavioral modification strategies: increasing lean protein intake, decreasing simple carbohydrates, meal planning and cooking strategies, keeping healthy foods in the home, and planning for success.  Debra Horn has agreed to follow-up with our clinic in 4 weeks. She was informed of the importance of frequent follow-up visits to maximize her success with intensive lifestyle modifications for her multiple health conditions.   Objective:   Blood pressure 131/84,  pulse 70, temperature 98.6 F (37 C), height 5\' 2"  (1.575 m), weight 260 lb (117.9 kg), last menstrual period 08/15/2021, SpO2 97 %. Body mass index is 47.55 kg/m.  General: Cooperative, alert, well developed, in no acute distress. HEENT: Conjunctivae and lids unremarkable. Cardiovascular: Regular rhythm.  Lungs: Normal work of breathing. Neurologic: No focal deficits.   Lab Results  Component Value Date   CREATININE 0.74 08/18/2021   BUN 13 08/18/2021   NA 140 08/18/2021   K 3.6 08/18/2021   CL 105 08/18/2021   CO2 27 08/18/2021   Lab Results  Component Value Date   ALT 12 08/18/2021   AST 9 08/18/2021   ALKPHOS 75 08/18/2021   BILITOT 0.5 08/18/2021   Lab Results  Component Value Date   HGBA1C 5.6 02/04/2021   HGBA1C 5.4 09/16/2020   HGBA1C 5.0 02/09/2017   HGBA1C 5.6 02/19/2014   Lab Results  Component Value Date   INSULIN 17.6 02/04/2021   INSULIN 16.1 09/16/2020   Lab Results  Component Value Date   TSH 1.68 08/18/2021   Lab Results  Component Value Date   CHOL 155 09/16/2020   HDL 46 09/16/2020   LDLCALC 97 09/16/2020   TRIG 62 09/16/2020   Lab Results  Component Value Date   VD25OH 48.9 02/04/2021   VD25OH 18.1 (L) 09/16/2020   VD25OH 8.4 (L) 02/09/2017   Lab Results  Component Value Date   WBC 8.3 08/18/2021   HGB 11.0 (L) 08/18/2021   HCT 35.1 (L) 08/18/2021   MCV 83.4  08/18/2021   PLT 420.0 (H) 08/18/2021   Lab Results  Component Value Date   IRON 19 (L) 03/07/2021   TIBC 486 (H) 03/07/2021   FERRITIN 6 (L) 03/07/2021   Attestation Statements:   Reviewed by clinician on day of visit: allergies, medications, problem list, medical history, surgical history, family history, social history, and previous encounter notes.  I, Malcolm Metro, RMA, am acting as Energy manager for William Hamburger, NP.  I have reviewed the above documentation for accuracy and completeness, and I agree with the above. -  ***

## 2021-08-27 ENCOUNTER — Other Ambulatory Visit: Payer: Self-pay | Admitting: Nurse Practitioner

## 2021-08-27 DIAGNOSIS — J4541 Moderate persistent asthma with (acute) exacerbation: Secondary | ICD-10-CM

## 2021-08-28 ENCOUNTER — Telehealth: Payer: BC Managed Care – PPO | Admitting: Physician Assistant

## 2021-08-28 DIAGNOSIS — J4541 Moderate persistent asthma with (acute) exacerbation: Secondary | ICD-10-CM

## 2021-08-28 MED ORDER — ALBUTEROL SULFATE HFA 108 (90 BASE) MCG/ACT IN AERS: 2.0000 | INHALATION_SPRAY | Freq: Four times a day (QID) | RESPIRATORY_TRACT | 0 refills | Status: DC | PRN

## 2021-08-28 NOTE — Addendum Note (Signed)
Addended by: Margaretann Loveless on: 08/28/2021 09:29 AM   Modules accepted: Orders

## 2021-08-28 NOTE — Progress Notes (Signed)
Visit for Asthma  Based on what you have shared with me, it looks like you may have a flare up of your asthma.  Asthma is a chronic (ongoing) lung disease which results in airway obstruction, inflammation and hyper-responsiveness.   Asthma symptoms vary from person to person, with common symptoms including nighttime awakening and decreased ability to participate in normal activities as a result of shortness of breath. It is often triggered by changes in weather, changes in the season, changes in air temperature, or inside (home, school, daycare or work) allergens such as animal dander, mold, mildew, woodstoves or cockroaches.   It can also be triggered by hormonal changes, extreme emotion, physical exertion or an upper respiratory tract illness.     It is important to identify the trigger, and then eliminate or avoid the trigger if possible.   If you have been prescribed medications to be taken on a regular basis, it is important to follow the asthma action plan and to follow guidelines to adjust medication in response to increasing symptoms of decreased peak expiratory flow rate  Treatment: I have prescribed: Albuterol (Proventil HFA; Ventolin HFA) 108 (90 Base) MCG/ACT Inhaler 2 puffs into the lungs every six hours as needed for wheezing or shortness of breath  HOME CARE Only take medications as instructed by your medical team. Consider wearing a mask or scarf to improve breathing air temperature have been shown to decrease irritation and decrease exacerbations Get rest. Taking a steamy shower or using a humidifier may help nasal congestion sand ease sore throat pain. You can place a towel over your head and breathe in the steam from hot water coming from a faucet. Using a saline nasal spray works much the same way.  Cough drops, hare candies and sore throat lozenges may ease your  cough.  Avoid close contacts especially the very you and the elderly Cover your mouth if you cough or sneeze Always remember to wash your hands.    GET HELP RIGHT AWAY IF: You develop worsening symptoms; breathlessness at rest, drowsy, confused or agitated, unable to speak in full sentences You have coughing fits You develop a severe headache or visual changes You develop shortness of breath, difficulty breathing or start having chest pain Your symptoms persist after you have completed your treatment plan If your symptoms do not improve within 10 days  MAKE SURE YOU Understand these instructions. Will watch your condition. Will get help right away if you are not doing well or get worse.   Your e-visit answers were reviewed by a board certified advanced clinical practitioner to complete your personal care plan, Depending upon the condition, your plan could have included both over the counter or prescription medications.   Please review your pharmacy choice. Your safety is important to us. If you have drug allergies check your prescription carefully.  You can use MyChart to ask questions about today's visit, request a non-urgent  call back, or ask for a work or school excuse for 24 hours related to this e-Visit. If it has been greater than 24 hours you will need to follow up with your provider, or enter a new e-Visit to address those concerns.   You will get an e-mail in the next two days asking about your experience. I hope that your e-visit has been valuable and will speed your recovery. Thank you for using e-visits.  I provided 5 minutes of non face-to-face time during this encounter for chart review and documentation.   

## 2021-08-28 NOTE — Progress Notes (Signed)
Patient needing refills of ProAir. Has 5 refills available at pharmacy. Notified patient of available refills. Also advised patient to follow up with PCP in future as she is using rescue inhaler frequently, needing refills at least monthly, sometimes sooner. She is not on any daily medication for reduction of exacerbations. May benefit from this addition. Will mark no charge at this time.

## 2021-08-31 ENCOUNTER — Encounter (INDEPENDENT_AMBULATORY_CARE_PROVIDER_SITE_OTHER): Payer: Self-pay

## 2021-08-31 ENCOUNTER — Telehealth (INDEPENDENT_AMBULATORY_CARE_PROVIDER_SITE_OTHER): Payer: Self-pay | Admitting: Adult Health

## 2021-08-31 NOTE — Telephone Encounter (Signed)
Debra Horn - Prior authorization denied for Rybelsus. Per insurance: Patient does not have type 2 diabetes. Patient sent denial message via mychart.  

## 2021-09-06 ENCOUNTER — Ambulatory Visit
Admission: EM | Admit: 2021-09-06 | Discharge: 2021-09-06 | Disposition: A | Discharge status: Home / Self Care | Payer: BC Managed Care – PPO | Attending: Internal Medicine | Admitting: Internal Medicine

## 2021-09-06 ENCOUNTER — Other Ambulatory Visit: Payer: Self-pay

## 2021-09-06 ENCOUNTER — Encounter: Payer: Self-pay | Admitting: Emergency Medicine

## 2021-09-06 DIAGNOSIS — T7840XA Allergy, unspecified, initial encounter: Secondary | ICD-10-CM | POA: Diagnosis not present

## 2021-09-06 DIAGNOSIS — J392 Other diseases of pharynx: Secondary | ICD-10-CM | POA: Diagnosis not present

## 2021-09-06 LAB — POCT RAPID STREP A (OFFICE): Rapid Strep A Screen: NEGATIVE

## 2021-09-06 MED ORDER — FAMOTIDINE 20 MG PO TABS: 20.0000 mg | ORAL_TABLET | Freq: Two times a day (BID) | ORAL | 0 refills | Status: DC

## 2021-09-06 MED ORDER — METHYLPREDNISOLONE SODIUM SUCC 125 MG IJ SOLR
80.0000 mg | Freq: Once | INTRAMUSCULAR | Status: AC
  Administered 2021-09-06: 80 mg via INTRAMUSCULAR

## 2021-09-06 MED ORDER — PREDNISONE 20 MG PO TABS: 40.0000 mg | ORAL_TABLET | Freq: Every day | ORAL | 0 refills | Status: AC

## 2021-09-06 NOTE — ED Provider Notes (Signed)
[facility]    CSN: [id number] Arrival date & time: 09/06/21  1752      History   Chief Complaint Chief Complaint  Patient presents with   Allergic Reaction    HPI Debra Horn is a 36 y.o. female.   Patient presents with concern for allergic reaction.  Patient reports that she has been taking a weight loss shot that she is not sure the name of.  She states that she took 1 shot yesterday with no reaction and another shot today.  Approximately 15 minutes after she gave herself the medication, she started feeling some throat irritation.  She reports that it feels like her throat is a little bit swollen.  Denies any associated sore throat.  Denies any feelings of chest pain, shortness of breath, difficulty breathing.  Denies any associated rash or itchiness.  Patient reports that she took Benadryl about an hour ago and states that she felt some improvement.   Allergic Reaction   Past Medical History:  Diagnosis Date   Anemia    low iron   Asthma    Complication of anesthesia    bp dropped in middle of last c-section and started with n/v   GERD (gastroesophageal reflux disease)    during pregnancy   Headache    "couple days/week" (10/28/2015)   Hyperthyroidism    not on medication   PE (pulmonary embolism) 11/2011   PONV (postoperative nausea and vomiting)    Pregnancy induced hypertension    Preterm labor    Sleep apnea    SOB (shortness of breath)    Thyroid condition    Vitamin D deficiency     Patient Active Problem List   Diagnosis Date Noted   Shortness of breath 08/22/2021   Moderate persistent asthma with exacerbation 07/28/2021   Diffuse pain 05/31/2021   Allergic reaction 05/11/2021   Iron deficiency anemia due to chronic blood loss 03/07/2021   Low folate 03/07/2021   Class 3 severe obesity with serious comorbidity and body mass index (BMI) of 40.0 to 44.9 in adult (HCC) 03/03/2021   CRP elevated 12/01/2020   Elevated sed rate  12/01/2020   Nasal drainage 11/09/2020   Insulin resistance 10/26/2020   Other chest pain 10/13/2020   Mild persistent asthma 06/24/2020   Severe sleep apnea 06/24/2020   BMI 45.0-49.9, adult (HCC) 06/24/2020   Status post repeat low transverse cesarean section 07/27/2017   Poor fetal growth affecting management of mother in third trimester    Previous pregnancy complicated by pregnancy-induced hypertension in third trimester, antepartum    IUGR (intrauterine growth restriction) in prior pregnancy, pregnant 03/06/2017   Vitamin D deficiency 02/17/2017   History of preterm delivery 02/09/2017   History of asthma    Normocytic anemia 02/06/2016   Family history of systemic lupus erythematosus (SLE) in mother 02/19/2014   Hyperthyroidism 02/19/2014   Previous cesarean section 08/08/2012   History of pulmonary embolus (PE) 07/24/2012    Past Surgical History:  Procedure Laterality Date   CESAREAN SECTION  2008; 2014   CESAREAN SECTION N/A 07/27/2017   Procedure: REPEAT CESAREAN SECTION;  Surgeon: Reva Bores, MD;  Location: [facility] SUIT[facility];  Service: Obstetrics;  Laterality: N/A;   FRACTURE SURGERY     ORIF ANKLE FRACTURE Right 10/28/2015   ORIF ANKLE FRACTURE Right 10/28/2015   Procedure: OPEN REDUCTION INTERNAL FIXATION (ORIF) RIGHT ANKLE FRACTURE;  Surgeon: Yolonda Kida, MD;  Location: [facility] OR;  Service: Orthopedics;  Laterality: Right;  TUBAL LIGATION Bilateral 07/27/2017   Procedure: BILATERAL TUBAL LIGATION;  Surgeon: Reva Bores, MD;  Location: [facility] BIRTHING SUITES;  Service: Obstetrics;  Laterality: Bilateral;    OB History     Gravida  3   Para  3   Term  1   Preterm  2   AB      Living  3      SAB      IAB      Ectopic      Multiple  0   Live Births  3            Home Medications    Prior to Admission medications   Medication Sig Start Date End Date Taking? Authorizing Provider  famotidine (PEPCID) 20 MG tablet Take 1 tablet  (20 mg total) by mouth 2 (two) times daily. 09/06/21  Yes Hartley Urton, Rolly Salter E, FNP  predniSONE (DELTASONE) 20 MG tablet Take 2 tablets (40 mg total) by mouth daily for 5 days. 09/06/21 09/11/21 Yes Artez Regis, Acie Fredrickson, FNP  albuterol (PROAIR HFA) 108 (90 Base) MCG/ACT inhaler Inhale 2 puffs into the lungs every 6 (six) hours as needed for shortness of breath. 08/28/21   Burnette, Alessandra Bevels, PA-C  fluticasone (FLONASE) 50 MCG/ACT nasal spray SPRAY 2 SPRAYS INTO EACH NOSTRIL EVERY DAY Patient taking differently: Place 2 sprays into both nostrils daily. 12/13/20   Danford, Orpha Bur D, NP  fluticasone (FLOVENT HFA) 110 MCG/ACT inhaler Inhale 2 puffs into the lungs in the morning and at bedtime. 06/24/20   Glenford Bayley, NP  folic acid (FOLVITE) 1 MG tablet TAKE 1 TABLET BY MOUTH EVERY DAY 06/01/21   Si Gaul, MD  loratadine (CLARITIN) 10 MG tablet Take 1 tablet (10 mg total) by mouth daily. 04/07/21   Brock Bad, MD  metFORMIN (GLUCOPHAGE) 500 MG tablet Take daily at dinner 07/25/21   Danford, Orpha Bur D, NP  metroNIDAZOLE (METROGEL VAGINAL) 0.75 % vaginal gel Place 1 Applicatorful vaginally 2 (two) times daily. Use monthly after the period foe 6 months 04/07/21   Brock Bad, MD  Semaglutide (RYBELSUS) 3 MG TABS Take 3 mg by mouth daily. 08/26/21   Danford, Orpha Bur D, NP  Vitamin D, Ergocalciferol, (DRISDOL) 1.25 MG (50000 UNIT) CAPS capsule Take cap every 14 days 07/25/21   Julaine Fusi, NP    Family History Family History  Problem Relation Age of Onset   Rheum arthritis Mother    Lupus Mother    Arthritis Mother    Hypertension Father    Diabetes Father    Cancer Father    Cancer Sister        skin   Cancer Maternal Grandmother        leukemia   Hypertension Maternal Grandfather    Stroke Maternal Grandfather    Hypertension Paternal Grandmother    Cancer Paternal Grandmother    Hyperlipidemia Paternal Grandmother    Diabetes Paternal Grandmother    Hypothyroidism Maternal Aunt    Lupus  Other     Social History Social History   Tobacco Use   Smoking status: Never   Smokeless tobacco: Never  Vaping Use   Vaping Use: Never used  Substance Use Topics   Alcohol use: No   Drug use: No     Allergies   Banana and Cefixime   Review of Systems Review of Systems Per HPI  Physical Exam Triage Vital Signs ED Triage Vitals  Enc Vitals Group     BP  09/06/21 1854 (!) 122/92     Pulse Rate 09/06/21 1854 92     Resp 09/06/21 1854 18     Temp 09/06/21 1854 98.3 F (36.8 C)     Temp Source 09/06/21 1854 Oral     SpO2 09/06/21 1854 100 %     Weight --      Height --      Head Circumference --      Peak Flow --      Pain Score 09/06/21 1855 0     Pain Loc --      Pain Edu? --      Excl. in [place]? --    No data found.  Updated Vital Signs BP (!) 122/92 (BP Location: Left Arm)   Pulse 92   Temp 98.3 F (36.8 C) (Oral)   Resp 18   LMP 08/15/2021   SpO2 100%   Visual Acuity Right Eye Distance:   Left Eye Distance:   Bilateral Distance:    Right Eye Near:   Left Eye Near:    Bilateral Near:     Physical Exam Constitutional:      General: She is not in acute distress.    Appearance: Normal appearance. She is not toxic-appearing or diaphoretic.  HENT:     Head: Normocephalic and atraumatic.     Mouth/Throat:     Pharynx: No pharyngeal swelling, oropharyngeal exudate, posterior oropharyngeal erythema or uvula swelling.     Tonsils: No tonsillar exudate or tonsillar abscesses. 1+ on the right. 1+ on the left.     Comments: Tongue normal.  No posterior throat swelling. Eyes:     Extraocular Movements: Extraocular movements intact.     Conjunctiva/sclera: Conjunctivae normal.  Cardiovascular:     Rate and Rhythm: Normal rate and regular rhythm.     Pulses: Normal pulses.     Heart sounds: Normal heart sounds.  Pulmonary:     Effort: Pulmonary effort is normal. No respiratory distress.     Breath sounds: Normal breath sounds. No stridor. No wheezing,  rhonchi or rales.  Neurological:     General: No focal deficit present.     Mental Status: She is alert and oriented to person, place, and time. Mental status is at baseline.  Psychiatric:        Mood and Affect: Mood normal.        Behavior: Behavior normal.        Thought Content: Thought content normal.        Judgment: Judgment normal.      UC Treatments / Results  Labs (all labs ordered are listed, but only abnormal results are displayed) Labs Reviewed  CULTURE, GROUP A STREP [facility])  POCT RAPID STREP A (OFFICE)    EKG   Radiology No results found.  Procedures Procedures (including critical care time)  Medications Ordered in UC Medications  methylPREDNISolone sodium succinate (SOLU-MEDROL) 125 mg/2 mL injection 80 mg (80 mg Intramuscular Given 09/06/21 1909)    Initial Impression / Assessment and Plan / UC Course  I have reviewed the triage vital signs and the nursing notes.  Pertinent labs & imaging results that were available during my care of the patient were reviewed by me and considered in my medical decision making (see chart for details).     Differential diagnoses include allergic reaction versus acute tonsillitis.  I am suspicious that it could be coincidental given that patient has throat irritation after giving herself a weight loss shot as  tonsils are enlarged but no other signs of allergic reaction.  Tonsillitis could be cause of patient's symptoms as opposed to allergic reaction.  Will treat for both.  Patient also took same medication yesterday with no reaction.  Rapid strep test was negative.  Throat culture is pending.  No obvious signs of bacterial infection on exam so will defer antibiotics.  IM Solu-Medrol administered in urgent care with minimal improvement in symptoms.  Patient does not have any adventitious lung sounds, no stridor, no wheezing, no respiratory distress, no shortness of breath so do not think that EpiPen or emergent evaluation at the  hospital is necessary.  Patient was prescribed prednisone to start taking tomorrow.  Patient has taken steroids previously and tolerated well.  Also prescribed Pepcid and discussed antihistamine use as well.  Patient was given strict return and ER precautions.  Patient verbalized understanding and was agreeable with plan. Final Clinical Impressions(s) / UC Diagnoses   Final diagnoses:  Throat irritation  Allergic reaction, initial encounter     Discharge Instructions      Strep was negative.  Throat culture is pending.  We will call if it is positive.  You were prescribed prednisone steroid.  Please start taking this tomorrow.  Pepcid has also been prescribed which will help alleviate allergic reaction as well.  You may stay taking this today.  Follow-up if symptoms persist or worsen.  Go to the hospital if you develop worsening symptoms or shortness of breath.    ED Prescriptions     Medication Sig Dispense Auth. Provider   predniSONE (DELTASONE) 20 MG tablet Take 2 tablets (40 mg total) by mouth daily for 5 days. 10 tablet [place], Hildred Alamin E, [place]   famotidine (PEPCID) 20 MG tablet Take 1 tablet (20 mg total) by mouth 2 (two) times daily. 30 tablet [place], Michele Rockers, [place]      PDMP not reviewed this encounter.   Teodora Medici, [place] 09/06/21 2015

## 2021-09-06 NOTE — ED Triage Notes (Signed)
Pt here for possible allergic reaction to weight loss shot that she had the second one today; pt sts feels like it is difficult to swallow but has no distress or trouble breathing

## 2021-09-06 NOTE — Discharge Instructions (Signed)
Strep was negative.  Throat culture is pending.  We will call if it is positive.  You were prescribed prednisone steroid.  Please start taking this tomorrow.  Pepcid has also been prescribed which will help alleviate allergic reaction as well.  You may stay taking this today.  Follow-up if symptoms persist or worsen.  Go to the hospital if you develop worsening symptoms or shortness of breath.

## 2021-09-09 LAB — CULTURE, GROUP A STREP (THRC)

## 2021-09-13 ENCOUNTER — Ambulatory Visit: Payer: BC Managed Care – PPO | Admitting: Allergy & Immunology

## 2021-09-15 DIAGNOSIS — Z6841 Body Mass Index (BMI) 40.0 and over, adult: Secondary | ICD-10-CM | POA: Diagnosis not present

## 2021-09-15 DIAGNOSIS — Z8709 Personal history of other diseases of the respiratory system: Secondary | ICD-10-CM | POA: Diagnosis not present

## 2021-09-15 DIAGNOSIS — Z760 Encounter for issue of repeat prescription: Secondary | ICD-10-CM | POA: Diagnosis not present

## 2021-09-17 ENCOUNTER — Other Ambulatory Visit (INDEPENDENT_AMBULATORY_CARE_PROVIDER_SITE_OTHER): Payer: Self-pay | Admitting: Adult Health

## 2021-09-17 DIAGNOSIS — E559 Vitamin D deficiency, unspecified: Secondary | ICD-10-CM

## 2021-09-20 ENCOUNTER — Ambulatory Visit (INDEPENDENT_AMBULATORY_CARE_PROVIDER_SITE_OTHER): Payer: BC Managed Care – PPO | Admitting: Adult Health

## 2021-09-20 ENCOUNTER — Encounter (INDEPENDENT_AMBULATORY_CARE_PROVIDER_SITE_OTHER): Payer: Self-pay

## 2021-09-21 ENCOUNTER — Ambulatory Visit (INDEPENDENT_AMBULATORY_CARE_PROVIDER_SITE_OTHER): Payer: BC Managed Care – PPO | Admitting: Adult Health

## 2021-09-21 ENCOUNTER — Encounter (INDEPENDENT_AMBULATORY_CARE_PROVIDER_SITE_OTHER): Payer: Self-pay | Admitting: Adult Health

## 2021-09-21 VITALS — BP 129/84 | HR 78 | Temp 98.7°F | Ht 62.0 in | Wt 268.0 lb

## 2021-09-21 DIAGNOSIS — E8881 Metabolic syndrome: Secondary | ICD-10-CM | POA: Diagnosis not present

## 2021-09-21 DIAGNOSIS — E559 Vitamin D deficiency, unspecified: Secondary | ICD-10-CM | POA: Diagnosis not present

## 2021-09-21 DIAGNOSIS — E669 Obesity, unspecified: Secondary | ICD-10-CM

## 2021-09-21 DIAGNOSIS — Z6841 Body Mass Index (BMI) 40.0 and over, adult: Secondary | ICD-10-CM

## 2021-09-22 ENCOUNTER — Encounter (INDEPENDENT_AMBULATORY_CARE_PROVIDER_SITE_OTHER): Payer: Self-pay | Admitting: Adult Health

## 2021-09-22 ENCOUNTER — Other Ambulatory Visit (INDEPENDENT_AMBULATORY_CARE_PROVIDER_SITE_OTHER): Payer: Self-pay | Admitting: Adult Health

## 2021-09-22 DIAGNOSIS — E559 Vitamin D deficiency, unspecified: Secondary | ICD-10-CM

## 2021-09-22 MED ORDER — VITAMIN D (ERGOCALCIFEROL) 1.25 MG (50000 UNIT) PO CAPS: ORAL_CAPSULE | ORAL | 0 refills | Status: DC

## 2021-09-23 LAB — HEMOGLOBIN A1C
Est. average glucose Bld gHb Est-mCnc: 114 mg/dL
Hgb A1c MFr Bld: 5.6 % (ref 4.8–5.6)

## 2021-09-23 LAB — VITAMIN D 25 HYDROXY (VIT D DEFICIENCY, FRACTURES): Vit D, 25-Hydroxy: 25.2 ng/mL — ABNORMAL LOW (ref 30.0–100.0)

## 2021-09-23 LAB — INSULIN, RANDOM: INSULIN: 37.1 u[IU]/mL — ABNORMAL HIGH (ref 2.6–24.9)

## 2021-09-24 NOTE — Progress Notes (Unsigned)
Chief Complaint:   OBESITY Debra Horn is here to discuss her progress with her obesity treatment plan along with follow-up of her obesity related diagnoses. Debra Horn is on the Category 2 Plan and states she is following her eating plan approximately 75% of the time. Debra Horn states she is walking 30 minutes 3 times per week.  Today's visit was #: 16 Starting weight: 244 lbs Starting date: 09/15/2020 Today's weight: 268 lbs Today's date: 09/21/2021 Total lbs lost to date: 0 Total lbs lost since last in-office visit: +8 lbs  Interim History: Her insurance will not cover Rybelsus.  She has remained on metformin 500 mg.  Breakfast:  She skips breakfast Lunch:  Salad or fruit or canned tuna. Snack:  almonds about 1 cup. Dinner:  chicken or fish (about 6 oz) with salad.  Subjective:   1. Vitamin D deficiency 02/21/2021, Vitamin D level ***  2. Insulin resistance She takes Metformin 500 mg with dinner then about and hour later she will "feel funny", she has nausea without vomiting and dizziness.   Assessment/Plan:   1. Vitamin D deficiency Check labs, then my chart message with results and refill ergocalciferol.   - VITAMIN D 25 Hydroxy (Vit-D Deficiency, Fractures)  2. Insulin resistance Discontinue metformin and check labs.   - Hemoglobin A1c - Insulin, random  3. Obesity, current BMI 49.2 1) protein shake in the morning. 2) 1/2 cup almonds.   Debra Horn is currently in the action stage of change. As such, her goal is to continue with weight loss efforts. She has agreed to the Category 2 Plan.   Exercise goals:  as is.  Behavioral modification strategies: increasing lean protein intake, decreasing simple carbohydrates, meal planning and cooking strategies, keeping healthy foods in the home, and planning for success.  Debra Horn has agreed to follow-up with our clinic in 4 weeks. She was informed of the importance of frequent follow-up visits to maximize her success with intensive  lifestyle modifications for her multiple health conditions.   Debra Horn was informed we would discuss her lab results at her next visit unless there is a critical issue that needs to be addressed sooner. Debra Horn agreed to keep her next visit at the agreed upon time to discuss these results.  Objective:   Blood pressure 129/84, pulse 78, temperature 98.7 F (37.1 C), height 5\' 2"  (1.575 m), weight 268 lb (121.6 kg), SpO2 97 %. Body mass index is 49.02 kg/m.  General: Cooperative, alert, well developed, in no acute distress. HEENT: Conjunctivae and lids unremarkable. Cardiovascular: Regular rhythm.  Lungs: Normal work of breathing. Neurologic: No focal deficits.   Lab Results  Component Value Date   CREATININE 0.74 08/18/2021   BUN 13 08/18/2021   NA 140 08/18/2021   K 3.6 08/18/2021   CL 105 08/18/2021   CO2 27 08/18/2021   Lab Results  Component Value Date   ALT 12 08/18/2021   AST 9 08/18/2021   ALKPHOS 75 08/18/2021   BILITOT 0.5 08/18/2021   Lab Results  Component Value Date   HGBA1C 5.6 09/21/2021   HGBA1C 5.6 02/04/2021   HGBA1C 5.4 09/16/2020   HGBA1C 5.0 02/09/2017   HGBA1C 5.6 02/19/2014   Lab Results  Component Value Date   INSULIN 37.1 (H) 09/21/2021   INSULIN 17.6 02/04/2021   INSULIN 16.1 09/16/2020   Lab Results  Component Value Date   TSH 1.68 08/18/2021   Lab Results  Component Value Date   CHOL 155 09/16/2020   HDL 46  09/16/2020   LDLCALC 97 09/16/2020   TRIG 62 09/16/2020   Lab Results  Component Value Date   VD25OH 25.2 (L) 09/21/2021   VD25OH 48.9 02/04/2021   VD25OH 18.1 (L) 09/16/2020   Lab Results  Component Value Date   WBC 8.3 08/18/2021   HGB 11.0 (L) 08/18/2021   HCT 35.1 (L) 08/18/2021   MCV 83.4 08/18/2021   PLT 420.0 (H) 08/18/2021   Lab Results  Component Value Date   IRON 19 (L) 03/07/2021   TIBC 486 (H) 03/07/2021   FERRITIN 6 (L) 03/07/2021    Attestation Statements:   Reviewed by clinician on day of visit:  allergies, medications, problem list, medical history, surgical history, family history, social history, and previous encounter notes.  I, Davy Pique, am acting as Location manager for Loyal Gambler, DO.  I have reviewed the above documentation for accuracy and completeness, and I agree with the above. -  ***

## 2021-10-05 ENCOUNTER — Telehealth: Payer: Medicaid Other | Admitting: Physician Assistant

## 2021-10-05 DIAGNOSIS — J4541 Moderate persistent asthma with (acute) exacerbation: Secondary | ICD-10-CM

## 2021-10-05 NOTE — Progress Notes (Signed)
Because of frequent asthma exacerbations and using Albuterol inhaler quite often to need refills so frequently, I feel your condition warrants further evaluation and I recommend that you be seen for a face to face visit.  Please contact your primary care physician practice to be seen. Many offices offer virtual options to be seen via video if you are not comfortable going in person to a medical facility at this time.  I feel you should contact your Primary care office, or Pulmonologist (if you have one) for uncontrolled asthma. It seems you may require a daily inhaler to have for better asthma control.  NOTE: You will NOT be charged for this eVisit.  If you do not have a PCP, [facility] offers a free physician referral service available at [phone]. Our trained staff has the experience, knowledge and resources to put you in touch with a physician who is right for you.    If you are having a true medical emergency please call 911.   Your e-visit answers were reviewed by a board certified advanced clinical practitioner to complete your personal care plan.  Thank you for using e-Visits.  I provided 5 minutes of non face-to-face time during this encounter for chart review and documentation.

## 2021-10-06 ENCOUNTER — Other Ambulatory Visit (INDEPENDENT_AMBULATORY_CARE_PROVIDER_SITE_OTHER): Payer: Self-pay | Admitting: Adult Health

## 2021-10-06 DIAGNOSIS — G4733 Obstructive sleep apnea (adult) (pediatric): Secondary | ICD-10-CM | POA: Diagnosis not present

## 2021-10-06 DIAGNOSIS — E559 Vitamin D deficiency, unspecified: Secondary | ICD-10-CM

## 2021-10-14 DIAGNOSIS — F341 Dysthymic disorder: Secondary | ICD-10-CM | POA: Diagnosis not present

## 2021-10-19 ENCOUNTER — Ambulatory Visit (INDEPENDENT_AMBULATORY_CARE_PROVIDER_SITE_OTHER): Payer: BC Managed Care – PPO | Admitting: Adult Health

## 2021-10-19 DIAGNOSIS — G4733 Obstructive sleep apnea (adult) (pediatric): Secondary | ICD-10-CM | POA: Diagnosis not present

## 2021-10-19 DIAGNOSIS — F341 Dysthymic disorder: Secondary | ICD-10-CM | POA: Diagnosis not present

## 2021-10-24 DIAGNOSIS — F341 Dysthymic disorder: Secondary | ICD-10-CM | POA: Diagnosis not present

## 2021-10-31 ENCOUNTER — Encounter (INDEPENDENT_AMBULATORY_CARE_PROVIDER_SITE_OTHER): Payer: Self-pay | Admitting: Adult Health

## 2021-10-31 ENCOUNTER — Ambulatory Visit (INDEPENDENT_AMBULATORY_CARE_PROVIDER_SITE_OTHER): Payer: BC Managed Care – PPO | Admitting: Adult Health

## 2021-10-31 VITALS — BP 115/75 | HR 68 | Temp 98.6°F | Ht 62.0 in | Wt 273.0 lb

## 2021-10-31 DIAGNOSIS — E669 Obesity, unspecified: Secondary | ICD-10-CM | POA: Diagnosis not present

## 2021-10-31 DIAGNOSIS — E88819 Insulin resistance, unspecified: Secondary | ICD-10-CM | POA: Diagnosis not present

## 2021-10-31 DIAGNOSIS — E559 Vitamin D deficiency, unspecified: Secondary | ICD-10-CM

## 2021-10-31 DIAGNOSIS — Z6841 Body Mass Index (BMI) 40.0 and over, adult: Secondary | ICD-10-CM

## 2021-10-31 MED ORDER — METFORMIN HCL ER 500 MG PO TB24: ORAL_TABLET | ORAL | 0 refills | Status: DC

## 2021-10-31 MED ORDER — VITAMIN D (ERGOCALCIFEROL) 1.25 MG (50000 UNIT) PO CAPS: ORAL_CAPSULE | ORAL | 0 refills | Status: DC

## 2021-10-31 MED ORDER — METFORMIN HCL 500 MG PO TABS: ORAL_TABLET | ORAL | 0 refills | Status: DC

## 2021-11-01 NOTE — Progress Notes (Unsigned)
Chief Complaint:   OBESITY Debra Horn is here to discuss her progress with her obesity treatment plan along with follow-up of her obesity related diagnoses. Debra Horn is on the Category 2 Plan and states she is following her eating plan approximately 50% of the time. Debra Horn states she is walking 20-30 minutes 5 times per week.  Today's visit was #: 17 Starting weight: 244 lbs Starting date: 09/15/2020 Today's weight: 273 lbs Today's date: 11/01/2021 Total lbs lost to date: 0 Total lbs lost since last in-office visit: +5 lbs  Interim History: For the last 2-1/2 weeks she has been following the cabbage soup diet.  Cabbage, corn, tomato juice, water, celery, green pepper, onion.  Also, boiled eggs, 100 calorie almonds, and has been drinking only water.   Subjective:   1. Vitamin D deficiency Worsening. Discussed labs with patient today. 09/21/2021 Vitamin D level 25.2.  She is taking ergocalciferol every 14 days.   2. Insulin resistance Discussed labs with patient today. 09/21/2021 A1c 5.6.  Insulin level 37.1.  previously on metformin plain 500 mg GI upset:  nausea without vomiting.   Assessment/Plan:   1. Vitamin D deficiency Increase - Vitamin D, Ergocalciferol, (DRISDOL) 1.25 MG (50000 UNIT) CAPS capsule; Take once per week  Dispense: 8 capsule; Refill: 0  2. Insulin resistance Start - metFORMIN (GLUCOPHAGE) 500 MG tablet; 11/01/21- 11/27/21 1/2 tab daily with full meal  Dispense: 30 tablet; Refill: 0  Then increase - metFORMIN (GLUCOPHAGE-XR) 500 MG 24 hr tablet; 11/28/21 one full daily with full meal  Dispense: 30 tablet; Refill: 0  3. Obesity, current BMI 49.9 Debra Horn is currently in the action stage of change. As such, her goal is to continue with weight loss efforts. She has agreed to the Category 2 Plan.   Exercise goals:  As is.   Behavioral modification strategies: increasing lean protein intake, decreasing simple carbohydrates, meal planning and cooking strategies,  keeping healthy foods in the home, and planning for success.  Debra Horn has agreed to follow-up with our clinic in 4 weeks. She was informed of the importance of frequent follow-up visits to maximize her success with intensive lifestyle modifications for her multiple health conditions.   Objective:   Blood pressure 115/75, pulse 68, temperature 98.6 F (37 C), height 5\' 2"  (1.575 m), weight 273 lb (123.8 kg), SpO2 99 %. Body mass index is 49.93 kg/m.  General: Cooperative, alert, well developed, in no acute distress. HEENT: Conjunctivae and lids unremarkable. Cardiovascular: Regular rhythm.  Lungs: Normal work of breathing. Neurologic: No focal deficits.   Lab Results  Component Value Date   CREATININE 0.74 08/18/2021   BUN 13 08/18/2021   NA 140 08/18/2021   K 3.6 08/18/2021   CL 105 08/18/2021   CO2 27 08/18/2021   Lab Results  Component Value Date   ALT 12 08/18/2021   AST 9 08/18/2021   ALKPHOS 75 08/18/2021   BILITOT 0.5 08/18/2021   Lab Results  Component Value Date   HGBA1C 5.6 09/21/2021   HGBA1C 5.6 02/04/2021   HGBA1C 5.4 09/16/2020   HGBA1C 5.0 02/09/2017   HGBA1C 5.6 02/19/2014   Lab Results  Component Value Date   INSULIN 37.1 (H) 09/21/2021   INSULIN 17.6 02/04/2021   INSULIN 16.1 09/16/2020   Lab Results  Component Value Date   TSH 1.68 08/18/2021   Lab Results  Component Value Date   CHOL 155 09/16/2020   HDL 46 09/16/2020   LDLCALC 97 09/16/2020   TRIG 62  09/16/2020   Lab Results  Component Value Date   VD25OH 25.2 (L) 09/21/2021   VD25OH 48.9 02/04/2021   VD25OH 18.1 (L) 09/16/2020   Lab Results  Component Value Date   WBC 8.3 08/18/2021   HGB 11.0 (L) 08/18/2021   HCT 35.1 (L) 08/18/2021   MCV 83.4 08/18/2021   PLT 420.0 (H) 08/18/2021   Lab Results  Component Value Date   IRON 19 (L) 03/07/2021   TIBC 486 (H) 03/07/2021   FERRITIN 6 (L) 03/07/2021   Attestation Statements:   Reviewed by clinician on day of visit:  allergies, medications, problem list, medical history, surgical history, family history, social history, and previous encounter notes.  I, Davy Pique, RMA, am acting as Location manager for Mina Marble, NP.  I have reviewed the above documentation for accuracy and completeness, and I agree with the above. -  ***

## 2021-11-05 ENCOUNTER — Telehealth: Payer: Medicaid Other

## 2021-11-15 ENCOUNTER — Ambulatory Visit: Payer: Medicaid Other | Admitting: Primary Care

## 2021-11-15 DIAGNOSIS — G4733 Obstructive sleep apnea (adult) (pediatric): Secondary | ICD-10-CM | POA: Diagnosis not present

## 2021-11-20 ENCOUNTER — Telehealth: Payer: BC Managed Care – PPO | Admitting: Family

## 2021-11-20 DIAGNOSIS — J45901 Unspecified asthma with (acute) exacerbation: Secondary | ICD-10-CM

## 2021-11-20 MED ORDER — ALBUTEROL SULFATE HFA 108 (90 BASE) MCG/ACT IN AERS: 2.0000 | INHALATION_SPRAY | Freq: Four times a day (QID) | RESPIRATORY_TRACT | 0 refills | Status: DC | PRN

## 2021-11-20 MED ORDER — PREDNISONE 20 MG PO TABS: 40.0000 mg | ORAL_TABLET | Freq: Every day | ORAL | 0 refills | Status: DC

## 2021-11-20 NOTE — Progress Notes (Signed)
Visit for Asthma  Based on what you have shared with me, it looks like you may have a flare up of your asthma.  Asthma is a chronic (ongoing) lung disease which results in airway obstruction, inflammation and hyper-responsiveness.   Asthma symptoms vary from person to person, with common symptoms including nighttime awakening and decreased ability to participate in normal activities as a result of shortness of breath. It is often triggered by changes in weather, changes in the season, changes in air temperature, or inside (home, school, daycare or work) allergens such as animal dander, mold, mildew, woodstoves or cockroaches.   It can also be triggered by hormonal changes, extreme emotion, physical exertion or an upper respiratory tract illness.     It is important to identify the trigger, and then eliminate or avoid the trigger if possible.   If you have been prescribed medications to be taken on a regular basis, it is important to follow the asthma action plan and to follow guidelines to adjust medication in response to increasing symptoms of decreased peak expiratory flow rate  Treatment: I have prescribed: Albuterol (Proventil HFA; Ventolin HFA) 108 (90 Base) MCG/ACT Inhaler 2 puffs into the lungs every six hours as needed for wheezing or shortness of breath and Prednisone 40mg  by mouth per day for 7 days.  You need to see your PCP office for medication changes as you are having asthma flare ups frequently.  HOME CARE Only take medications as instructed by your medical team. Consider wearing a mask or scarf to improve breathing air temperature have been shown to decrease irritation and decrease exacerbations Get rest. Taking a steamy shower or using a humidifier may help nasal congestion sand ease sore throat pain. You can place a towel over your head and breathe in the steam from  hot water coming from a faucet. Using a saline nasal spray works much the same way.  Cough drops, hare candies and sore throat lozenges may ease your cough.  Avoid close contacts especially the very you and the elderly Cover your mouth if you cough or sneeze Always remember to wash your hands.    GET HELP RIGHT AWAY IF: You develop worsening symptoms; breathlessness at rest, drowsy, confused or agitated, unable to speak in full sentences You have coughing fits You develop a severe headache or visual changes You develop shortness of breath, difficulty breathing or start having chest pain Your symptoms persist after you have completed your treatment plan If your symptoms do not improve within 10 days  MAKE SURE YOU Understand these instructions. Will watch your condition. Will get help right away if you are not doing well or get worse.   Your e-visit answers were reviewed by a board certified advanced clinical practitioner to complete your personal care plan, Depending upon the condition, your plan could have included both over the counter or prescription medications.   Please review your pharmacy choice. Your safety is important to . If you have drug allergies check your prescription carefully.  You can use MyChart to ask questions about today's visit, request a non-urgent  call back, or ask for a work or school excuse for 24 hours related to this e-Visit. If it has been greater than 24 hours you will need to follow up with your provider, or enter a new e-Visit to address those concerns.   You will get an e-mail in the next two days asking about your experience. I hope that your e-visit has been valuable and will  speed your recovery. Thank you for using e-visits.  Approximately 5 minutes was spent documenting and reviewing patient's chart.

## 2021-11-25 ENCOUNTER — Ambulatory Visit (INDEPENDENT_AMBULATORY_CARE_PROVIDER_SITE_OTHER): Payer: BC Managed Care – PPO | Admitting: Primary Care

## 2021-11-25 VITALS — BP 122/74 | HR 73 | Temp 98.4°F | Ht 62.0 in | Wt 274.8 lb

## 2021-11-25 DIAGNOSIS — J4541 Moderate persistent asthma with (acute) exacerbation: Secondary | ICD-10-CM | POA: Diagnosis not present

## 2021-11-25 DIAGNOSIS — G4733 Obstructive sleep apnea (adult) (pediatric): Secondary | ICD-10-CM | POA: Diagnosis not present

## 2021-11-25 DIAGNOSIS — G4730 Sleep apnea, unspecified: Secondary | ICD-10-CM | POA: Diagnosis not present

## 2021-11-25 MED ORDER — FLUTICASONE PROPIONATE HFA 110 MCG/ACT IN AERO: 2.0000 | INHALATION_SPRAY | Freq: Two times a day (BID) | RESPIRATORY_TRACT | 11 refills | Status: DC

## 2021-11-25 MED ORDER — ALBUTEROL SULFATE HFA 108 (90 BASE) MCG/ACT IN AERS: 2.0000 | INHALATION_SPRAY | Freq: Four times a day (QID) | RESPIRATORY_TRACT | 3 refills | Status: DC | PRN

## 2021-11-25 NOTE — Assessment & Plan Note (Addendum)
-   Increased/varying asthma symptoms off ICS for the last 3 months ago. Pulmonary function testing in June 2022 was normal without BD response. FENO 36. She was treated for acute asthma exacerbation on 11/20/21 with prednisone taper with improvement in her symptoms. Recommend patient resume Flovent HFA inhaler 2 puffs twice daily and continue Albuterol hfa 2 puffs q 4-6 hours for breakthrough symptoms. Follow-up in 6 months or sooner if needed

## 2021-11-25 NOTE — Patient Instructions (Addendum)
CT CHEST in April showed no evidence of pulmonary embolism or acute cardiopulmonary disease  Recommendations: - Stop Symbicort  - Resume Flovent - take two puffs every morning and evening (rinse mouth after use) - Use albuterol 2 puffs every 4-6 hours as needed for breakthrough shortness of breath, wheezing - We can either get you a chin strap, change to full face mask or change pressure from auto settings to set pressure   Order: - Mask fitting with DME for full face mask  Follow-up - 6 months with Dr. Celine Mans

## 2021-11-25 NOTE — Assessment & Plan Note (Addendum)
-   Patient is compliant with CPAP, feels pressure is no longer strong enough. She is having moderate-large amount of airleaks.Events are well controlled on auto pressure settings 5-20cm h20. Recommend she try full face mask.

## 2021-11-25 NOTE — Progress Notes (Signed)
@Patient  ID: Debra Horn, female    DOB: May 30, 1985, 36 y.o.   MRN: [id number]  Chief Complaint  Patient presents with   Follow-up    Frequent wheeze and cough.    Referring provider: Girtha Rm, NP-C  HPI: 36 year old female, never smoked. PMH significant for asthma, hypothyroidism, pulmonary embolism.  Patient of Dr. Shearon Stalls.  Previous [facility] pulmonary encounter:  06/24/2020 Patient presents today for 2 month follow-up with PFTs. She was started on Symbicort and prn albuterol. She states that her shortness of breath was worse after taking Symbicort so she stopped. Her PFTs were normal today. She reports that smells and smoke with trigger asthma symptoms. She wakes up in the middle of the night feeling as though she can not breath. She has morning headaches. Her home sleep study in May showed severe OSA. We discussed treatment options including weight loss, oral appliance, CPAP or referral to ENT.    11/25/2021- Interim hx  Patient presents today for overdue asthma follow-up.   She was last seen in June 2022. Pulmonary function testing at that time showed normal spirometry without BD response/ FEV1 2.65, ratio 80.   She contacted her PCP on 11/19  for virtual visit d/t asthma exacerbation, she was sent in prednisone 40mg  x 7 days and prn albuterol. She reports wheezing symptoms for approximately 1 week.  She ran out of Flovent inhaler 3 months ago. She uses Albuterol 1-2 times per day. She completed prednisone course which helped. Denies cough or nasal congestion.   She is 100% compliant with CPAP use. She feels CPAP pressure is no longer strong enough. She brought her machine by DME company and everything looked fine. She replaced her mask and supplies. She is sleeping alright at night without significant residual daytime sleepiness.  Airview download 10/24/21-11/22/21 30/30 days used; 97% > 4 hours Average usage 8 hours 5 mins Pressure 5-20cm h20 Airleaks 33.3L/min (95%) AHI  3.0    Pulmonary testing: 06/24/2020 PFTs- FVC 3.30 (108%), FEV1 2.65 (103%), ratio 80, TLC 112%, DLCOunc 97% Normal Spirometry without BD response. Normal diffusion capacity   06/24/2020 FENO - 36   Allergies  Allergen Reactions   Banana Anaphylaxis   Cefixime Rash    Immunization History  Administered Date(s) Administered   Influenza-Unspecified 01/16/2018, 11/25/2018   PFIZER Comirnaty(Gray Top)Covid-19 Tri-Sucrose Vaccine 08/30/2019, 09/27/2019   Tdap 08/08/2012, 07/29/2017    Past Medical History:  Diagnosis Date   Anemia    low iron   Asthma    Complication of anesthesia    bp dropped in middle of last c-section and started with n/v   GERD (gastroesophageal reflux disease)    during pregnancy   Headache    "couple days/week" (10/28/2015)   Hyperthyroidism    not on medication   PE (pulmonary embolism) 11/2011   PONV (postoperative nausea and vomiting)    Pregnancy induced hypertension    Preterm labor    Sleep apnea    SOB (shortness of breath)    Thyroid condition    Vitamin D deficiency     Tobacco History: Social History   Tobacco Use  Smoking Status Never  Smokeless Tobacco Never   Counseling given: Not Answered   Outpatient Medications Prior to Visit  Medication Sig Dispense Refill   famotidine (PEPCID) 20 MG tablet Take 1 tablet (20 mg total) by mouth 2 (two) times daily. 30 tablet 0   fluticasone (FLONASE) 50 MCG/ACT nasal spray SPRAY 2 SPRAYS INTO EACH NOSTRIL  EVERY DAY (Patient taking differently: Place 2 sprays into both nostrils daily.) 16 mL 0   folic acid (FOLVITE) 1 MG tablet TAKE 1 TABLET BY MOUTH EVERY DAY 90 tablet 1   loratadine (CLARITIN) 10 MG tablet Take 1 tablet (10 mg total) by mouth daily. 30 tablet 11   metFORMIN (GLUCOPHAGE-XR) 500 MG 24 hr tablet 11/28/21 one full daily with full meal 30 tablet 0   Vitamin D, Ergocalciferol, (DRISDOL) 1.25 MG (50000 UNIT) CAPS capsule Take once per week 8 capsule 0   albuterol (VENTOLIN  HFA) 108 (90 Base) MCG/ACT inhaler Inhale 2 puffs into the lungs every 6 (six) hours as needed for wheezing or shortness of breath. 8 g 0   metFORMIN (GLUCOPHAGE) 500 MG tablet 11/01/21- 11/27/21 1/2 tab daily with full meal (Patient not taking: Reported on 11/25/2021) 30 tablet 0   metroNIDAZOLE (METROGEL VAGINAL) 0.75 % vaginal gel Place 1 Applicatorful vaginally 2 (two) times daily. Use monthly after the period foe 6 months (Patient not taking: Reported on 11/25/2021) 70 g 5   budesonide-formoterol (SYMBICORT) 160-4.5 MCG/ACT inhaler Inhale 2 puffs into the lungs every 12 (twelve) hours. (Patient not taking: Reported on 11/25/2021)     fluticasone (FLOVENT HFA) 110 MCG/ACT inhaler Inhale 2 puffs into the lungs in the morning and at bedtime. (Patient not taking: Reported on 11/25/2021) 1 each 5   predniSONE (DELTASONE) 20 MG tablet Take 2 tablets (40 mg total) by mouth daily with breakfast for 7 days. (Patient not taking: Reported on 11/25/2021) 14 tablet 0   No facility-administered medications prior to visit.      Review of Systems  Review of Systems  Constitutional: Negative.   HENT: Negative.    Respiratory: Negative.  Negative for cough and shortness of breath.   Cardiovascular: Negative.      Physical Exam  BP 122/74 (BP Location: Left Arm, Patient Position: Sitting, Cuff Size: Large)   Pulse 73   Temp 98.4 F (36.9 C) (Oral)   Ht 5\' 2"  (1.575 m)   Wt 274 lb 12.8 oz (124.6 kg)   SpO2 99%   BMI 50.26 kg/m  Physical Exam Constitutional:      Appearance: Normal appearance.  HENT:     Head: Normocephalic and atraumatic.     Mouth/Throat:     Mouth: Mucous membranes are moist.     Pharynx: Oropharynx is clear.  Cardiovascular:     Rate and Rhythm: Normal rate and regular rhythm.  Pulmonary:     Effort: Pulmonary effort is normal. No respiratory distress.     Breath sounds: No wheezing.  Musculoskeletal:        General: Normal range of motion.     Cervical back:  Normal range of motion and neck supple.  Skin:    General: Skin is warm.  Neurological:     General: No focal deficit present.     Mental Status: She is alert and oriented to person, place, and time. Mental status is at baseline.  Psychiatric:        Mood and Affect: Mood normal.        Behavior: Behavior normal.        Thought Content: Thought content normal.        Judgment: Judgment normal.      Lab Results:  CBC    Component Value Date/Time   WBC 8.3 08/18/2021 1020   RBC 4.20 08/18/2021 1020   HGB 11.0 (L) 08/18/2021 1020   HGB 9.2 (L) 03/07/2021  1130   HGB 11.3 02/09/2017 1233   HCT 35.1 (L) 08/18/2021 1020   HCT 34.5 02/09/2017 1233   PLT 420.0 (H) 08/18/2021 1020   PLT 524 (H) 03/07/2021 1130   PLT 378 02/09/2017 1233   MCV 83.4 08/18/2021 1020   MCV 85 02/09/2017 1233   MCH 23.6 (L) 04/13/2021 1828   MCHC 31.3 08/18/2021 1020   RDW 17.8 (H) 08/18/2021 1020   RDW 16.3 (H) 02/09/2017 1233   LYMPHSABS 3.8 08/18/2021 1020   LYMPHSABS 2.8 02/09/2017 1233   MONOABS 0.7 08/18/2021 1020   EOSABS 0.1 08/18/2021 1020   EOSABS 0.5 (H) 02/09/2017 1233   BASOSABS 0.1 08/18/2021 1020   BASOSABS 0.0 02/09/2017 1233    BMET    Component Value Date/Time   NA 140 08/18/2021 1020   NA 139 02/09/2017 1233   K 3.6 08/18/2021 1020   CL 105 08/18/2021 1020   CO2 27 08/18/2021 1020   GLUCOSE 87 08/18/2021 1020   BUN 13 08/18/2021 1020   BUN 4 (L) 02/09/2017 1233   CREATININE 0.74 08/18/2021 1020   CREATININE 0.76 03/07/2021 1130   CALCIUM 9.2 08/18/2021 1020   GFRNONAA >60 04/13/2021 1828   GFRNONAA >60 03/07/2021 1130   GFRAA >60 07/31/2017 1441    BNP No results found for: "BNP"  ProBNP    Component Value Date/Time   PROBNP 14.0 08/18/2021 1020    Imaging: No results found.   Assessment & Plan:   Moderate persistent asthma with exacerbation - Increased/varying asthma symptoms off ICS for the last 3 months ago. Pulmonary function testing in June 2022  was normal without BD response. FENO 36. She was treated for acute asthma exacerbation on 11/20/21 with prednisone taper with improvement in her symptoms. Recommend patient resume Flovent HFA inhaler 2 puffs twice daily and continue Albuterol hfa 2 puffs q 4-6 hours for breakthrough symptoms. Follow-up in 6 months or sooner if needed    Severe sleep apnea - Patient is compliant with CPAP, feels pressure is no longer strong enough. She is having moderate-large amount of airleaks.Events are well controlled on auto pressure settings 5-20cm h20. Recommend she try full face mask.      Martyn Ehrich, NP 11/25/2021

## 2021-11-27 ENCOUNTER — Other Ambulatory Visit (INDEPENDENT_AMBULATORY_CARE_PROVIDER_SITE_OTHER): Payer: Self-pay | Admitting: Adult Health

## 2021-11-27 DIAGNOSIS — E88819 Insulin resistance, unspecified: Secondary | ICD-10-CM

## 2021-11-30 ENCOUNTER — Ambulatory Visit (INDEPENDENT_AMBULATORY_CARE_PROVIDER_SITE_OTHER): Payer: BC Managed Care – PPO | Admitting: Family Medicine

## 2021-12-02 DIAGNOSIS — F341 Dysthymic disorder: Secondary | ICD-10-CM | POA: Diagnosis not present

## 2021-12-08 ENCOUNTER — Ambulatory Visit: Payer: Medicaid Other | Admitting: Family Medicine

## 2021-12-08 DIAGNOSIS — F341 Dysthymic disorder: Secondary | ICD-10-CM | POA: Diagnosis not present

## 2021-12-10 ENCOUNTER — Other Ambulatory Visit (INDEPENDENT_AMBULATORY_CARE_PROVIDER_SITE_OTHER): Payer: Self-pay | Admitting: Adult Health

## 2021-12-10 DIAGNOSIS — E559 Vitamin D deficiency, unspecified: Secondary | ICD-10-CM

## 2021-12-13 ENCOUNTER — Encounter (INDEPENDENT_AMBULATORY_CARE_PROVIDER_SITE_OTHER): Payer: Self-pay | Admitting: Family Medicine

## 2021-12-13 ENCOUNTER — Telehealth (INDEPENDENT_AMBULATORY_CARE_PROVIDER_SITE_OTHER): Payer: BC Managed Care – PPO | Admitting: Family Medicine

## 2021-12-13 DIAGNOSIS — Z6841 Body Mass Index (BMI) 40.0 and over, adult: Secondary | ICD-10-CM | POA: Diagnosis not present

## 2021-12-13 DIAGNOSIS — E669 Obesity, unspecified: Secondary | ICD-10-CM | POA: Diagnosis not present

## 2021-12-13 DIAGNOSIS — E559 Vitamin D deficiency, unspecified: Secondary | ICD-10-CM

## 2021-12-13 DIAGNOSIS — E88819 Insulin resistance, unspecified: Secondary | ICD-10-CM

## 2021-12-13 DIAGNOSIS — F341 Dysthymic disorder: Secondary | ICD-10-CM | POA: Diagnosis not present

## 2021-12-13 MED ORDER — METFORMIN HCL ER 500 MG PO TB24: ORAL_TABLET | ORAL | 0 refills | Status: DC

## 2021-12-13 MED ORDER — VITAMIN D (ERGOCALCIFEROL) 1.25 MG (50000 UNIT) PO CAPS: ORAL_CAPSULE | ORAL | 0 refills | Status: DC

## 2021-12-15 DIAGNOSIS — F331 Major depressive disorder, recurrent, moderate: Secondary | ICD-10-CM | POA: Diagnosis not present

## 2021-12-22 NOTE — Progress Notes (Signed)
TeleHealth Visit:  Due to the COVID-19 pandemic, this visit was completed with telemedicine (audio/video) technology to reduce patient and provider exposure as well as to preserve personal protective equipment.   Debra Horn has verbally consented to this TeleHealth visit. The patient is located at home, the provider is located at the [facility] office. The participants in this visit include the listed provider and patient. The visit was conducted today via video.  Chief Complaint: OBESITY Debra Horn is here to discuss her progress with her obesity treatment plan along with follow-up of her obesity related diagnoses. Debra Horn is on the Category 2 Plan and states she is following her eating plan approximately 75% of the time. Debra Horn states she is walking 30 minutes 5 times per week.  Today's visit was #: 18 Starting weight: 244 LBS Starting date: 09/15/2020  Interim History: First office visit with me.  Not eating all proteins.  Just not hungry and skips several meals and snacks on handfuls of almonds. Subjective:   1. Insulin resistance Patient was on metformin XR and tolerating well the past 1 month.  No side effects or GI symptoms.  Patient gets full sooner and cannot get in her proteins.  She denies snacking, but she loves her almonds and eats handfuls of them.  2. Vitamin D deficiency She is currently taking prescription vitamin D 50,000 IU each week. She denies nausea, vomiting or muscle weakness.  Assessment/Plan:  No orders of the defined types were placed in this encounter.   Medications Discontinued During This Encounter  Medication Reason   metFORMIN (GLUCOPHAGE) 500 MG tablet Change in therapy   metroNIDAZOLE (METROGEL VAGINAL) 0.75 % vaginal gel Patient Preference   Vitamin D, Ergocalciferol, (DRISDOL) 1.25 MG (50000 UNIT) CAPS capsule Reorder   metFORMIN (GLUCOPHAGE-XR) 500 MG 24 hr tablet Reorder     Meds ordered this encounter  Medications   metFORMIN  (GLUCOPHAGE-XR) 500 MG 24 hr tablet    Sig: 11/28/21 one full daily with full meal    Dispense:  30 tablet    Refill:  0   Vitamin D, Ergocalciferol, (DRISDOL) 1.25 MG (50000 UNIT) CAPS capsule    Sig: Take once per week    Dispense:  4 capsule    Refill:  0     1. Insulin resistance No skipping meals or proteins.  Calculate snack calories.  Refill- metFORMIN (GLUCOPHAGE-XR) 500 MG 24 hr tablet; 11/28/21 one full daily with full meal  Dispense: 30 tablet; Refill: 0  2. Vitamin D deficiency Low Vitamin D level contributes to fatigue and are associated with obesity, breast, and colon cancer. She agrees to continue to take prescription Vitamin D @50 ,000 IU every week and will follow-up for routine testing of Vitamin D, at least 2-3 times per year to avoid over-replacement.  Refill - Vitamin D, Ergocalciferol, (DRISDOL) 1.25 MG (50000 UNIT) CAPS capsule; Take once per week  Dispense: 4 capsule; Refill: 0   3. Obesity, current BMI 49.9 Eat all foods on plan and break it up into multiple smaller meals all day long. Calculate snack calories.  Debra Horn is currently in the action stage of change. As such, her goal is to continue with weight loss efforts. She has agreed to the Category 2 Plan.   Exercise goals: All adults should avoid inactivity. Some physical activity is better than none, and adults who participate in any amount of physical activity gain some health benefits.  Behavioral modification strategies: holiday eating strategies .  Debra Horn has agreed  to follow-up with our clinic in 3-4 weeks. She was informed of the importance of frequent follow-up visits to maximize her success with intensive lifestyle modifications for her multiple health conditions.  Objective:   VITALS: Per patient if applicable, see vitals. GENERAL: Alert and in no acute distress. CARDIOPULMONARY: No increased WOB. Speaking in clear sentences.  PSYCH: Pleasant and cooperative. Speech normal rate and rhythm.  Affect is appropriate. Insight and judgement are appropriate. Attention is focused, linear, and appropriate.  NEURO: Oriented as arrived to appointment on time with no prompting.   Lab Results  Component Value Date   CREATININE 0.74 08/18/2021   BUN 13 08/18/2021   NA 140 08/18/2021   K 3.6 08/18/2021   CL 105 08/18/2021   CO2 27 08/18/2021   Lab Results  Component Value Date   ALT 12 08/18/2021   AST 9 08/18/2021   ALKPHOS 75 08/18/2021   BILITOT 0.5 08/18/2021   Lab Results  Component Value Date   HGBA1C 5.6 09/21/2021   HGBA1C 5.6 02/04/2021   HGBA1C 5.4 09/16/2020   HGBA1C 5.0 02/09/2017   HGBA1C 5.6 02/19/2014   Lab Results  Component Value Date   INSULIN 37.1 (H) 09/21/2021   INSULIN 17.6 02/04/2021   INSULIN 16.1 09/16/2020   Lab Results  Component Value Date   TSH 1.68 08/18/2021   Lab Results  Component Value Date   CHOL 155 09/16/2020   HDL 46 09/16/2020   LDLCALC 97 09/16/2020   TRIG 62 09/16/2020   Lab Results  Component Value Date   VD25OH 25.2 (L) 09/21/2021   VD25OH 48.9 02/04/2021   VD25OH 18.1 (L) 09/16/2020   Lab Results  Component Value Date   WBC 8.3 08/18/2021   HGB 11.0 (L) 08/18/2021   HCT 35.1 (L) 08/18/2021   MCV 83.4 08/18/2021   PLT 420.0 (H) 08/18/2021   Lab Results  Component Value Date   IRON 19 (L) 03/07/2021   TIBC 486 (H) 03/07/2021   FERRITIN 6 (L) 03/07/2021    Attestation Statements:   Reviewed by clinician on day of visit: allergies, medications, problem list, medical history, surgical history, family history, social history, and previous encounter notes.  I, Malcolm Metro, RMA, am acting as Energy manager for [employer], DO.   I have reviewed the above documentation for accuracy and completeness, and I agree with the above. Carlye Grippe, D.O.  The 21st Century Cures Act was signed into law in 2016 which includes the topic of electronic health records.  This provides immediate access to  information in MyChart.  This includes consultation notes, operative notes, office notes, lab results and pathology reports.  If you have any questions about what you read please let us know at your next visit so we can discuss your concerns and take corrective action if need be.  We are right here with you.

## 2021-12-23 DIAGNOSIS — F341 Dysthymic disorder: Secondary | ICD-10-CM | POA: Diagnosis not present

## 2021-12-29 DIAGNOSIS — F331 Major depressive disorder, recurrent, moderate: Secondary | ICD-10-CM | POA: Diagnosis not present

## 2022-01-04 DIAGNOSIS — F341 Dysthymic disorder: Secondary | ICD-10-CM | POA: Diagnosis not present

## 2022-01-05 ENCOUNTER — Ambulatory Visit (INDEPENDENT_AMBULATORY_CARE_PROVIDER_SITE_OTHER): Payer: BC Managed Care – PPO | Admitting: Adult Health

## 2022-01-12 ENCOUNTER — Telehealth: Payer: BC Managed Care – PPO | Admitting: Emergency Medicine

## 2022-01-12 DIAGNOSIS — J45909 Unspecified asthma, uncomplicated: Secondary | ICD-10-CM | POA: Diagnosis not present

## 2022-01-12 MED ORDER — ALBUTEROL SULFATE HFA 108 (90 BASE) MCG/ACT IN AERS: 2.0000 | INHALATION_SPRAY | Freq: Four times a day (QID) | RESPIRATORY_TRACT | 0 refills | Status: DC | PRN

## 2022-01-12 NOTE — Progress Notes (Signed)
Visit for Asthma  Based on what you have shared with me, it looks like you may have a flare up of your asthma.  Asthma is a chronic (ongoing) lung disease which results in airway obstruction, inflammation and hyper-responsiveness.   I see a lot of Evisits to get inhalers! Can you make an appointment to see your regular primary care provider to talk about how your asthma is doing? I'm wondering if you might be helped by a change in your asthma prevention medicines.    Asthma symptoms vary from person to person, with common symptoms including nighttime awakening and decreased ability to participate in normal activities as a result of shortness of breath. It is often triggered by changes in weather, changes in the season, changes in air temperature, or inside (home, school, daycare or work) allergens such as animal dander, mold, mildew, woodstoves or cockroaches.   It can also be triggered by hormonal changes, extreme emotion, physical exertion or an upper respiratory tract illness.     It is important to identify the trigger, and then eliminate or avoid the trigger if possible.   If you have been prescribed medications to be taken on a regular basis, it is important to follow the asthma action plan and to follow guidelines to adjust medication in response to increasing symptoms of decreased peak expiratory flow rate  Treatment: I have prescribed: Albuterol (Proventil HFA; Ventolin HFA) 108 (90 Base) MCG/ACT Inhaler 2 puffs into the lungs every six hours as needed for wheezing or shortness of breath  HOME CARE Only take medications as instructed by your medical team. Consider wearing a mask or scarf to improve breathing air temperature have been shown to decrease irritation and decrease exacerbations Get rest. Taking a steamy shower or using a humidifier may help nasal congestion sand ease sore throat pain. You can place a towel over your  head and breathe in the steam from hot water coming from a faucet. Using a saline nasal spray works much the same way.  Cough drops, hare candies and sore throat lozenges may ease your cough.  Avoid close contacts especially the very you and the elderly Cover your mouth if you cough or sneeze Always remember to wash your hands.    GET HELP RIGHT AWAY IF: You develop worsening symptoms; breathlessness at rest, drowsy, confused or agitated, unable to speak in full sentences You have coughing fits You develop a severe headache or visual changes You develop shortness of breath, difficulty breathing or start having chest pain Your symptoms persist after you have completed your treatment plan If your symptoms do not improve within 10 days  MAKE SURE YOU Understand these instructions. Will watch your condition. Will get help right away if you are not doing well or get worse.   Your e-visit answers were reviewed by a board certified advanced clinical practitioner to complete your personal care plan, Depending upon the condition, your plan could have included both over the counter or prescription medications.   Please review your pharmacy choice. Your safety is important to [place]. If you have drug allergies check your prescription carefully.  You can use MyChart to ask questions about today's visit, request a non-urgent  call back, or ask for a work or school excuse for 24 hours related to this e-Visit. If it has been greater than 24 hours you will need to follow up with your provider, or enter a new e-Visit to address those concerns.   You will get an e-mail in  the next two days asking about your experience. I hope that your e-visit has been valuable and will speed your recovery. Thank you for using e-visits.  I have spent 5 minutes in review of e-visit questionnaire, review and updating patient chart, medical decision making and response to patient.   Willeen Cass, PhD, FNP-BC

## 2022-01-13 DIAGNOSIS — F411 Generalized anxiety disorder: Secondary | ICD-10-CM | POA: Diagnosis not present

## 2022-01-19 DIAGNOSIS — F331 Major depressive disorder, recurrent, moderate: Secondary | ICD-10-CM | POA: Diagnosis not present

## 2022-01-20 DIAGNOSIS — F411 Generalized anxiety disorder: Secondary | ICD-10-CM | POA: Diagnosis not present

## 2022-01-27 DIAGNOSIS — F411 Generalized anxiety disorder: Secondary | ICD-10-CM | POA: Diagnosis not present

## 2022-02-01 DIAGNOSIS — G4733 Obstructive sleep apnea (adult) (pediatric): Secondary | ICD-10-CM | POA: Diagnosis not present

## 2022-02-06 ENCOUNTER — Ambulatory Visit (INDEPENDENT_AMBULATORY_CARE_PROVIDER_SITE_OTHER): Payer: BC Managed Care – PPO | Admitting: Adult Health

## 2022-02-06 ENCOUNTER — Other Ambulatory Visit (INDEPENDENT_AMBULATORY_CARE_PROVIDER_SITE_OTHER): Payer: Self-pay | Admitting: Adult Health

## 2022-02-06 ENCOUNTER — Encounter (INDEPENDENT_AMBULATORY_CARE_PROVIDER_SITE_OTHER): Payer: Self-pay | Admitting: Adult Health

## 2022-02-06 VITALS — BP 133/84 | HR 83 | Temp 99.3°F | Ht 62.0 in | Wt 258.0 lb

## 2022-02-06 DIAGNOSIS — E88819 Insulin resistance, unspecified: Secondary | ICD-10-CM

## 2022-02-06 DIAGNOSIS — E669 Obesity, unspecified: Secondary | ICD-10-CM | POA: Diagnosis not present

## 2022-02-06 DIAGNOSIS — Z6841 Body Mass Index (BMI) 40.0 and over, adult: Secondary | ICD-10-CM | POA: Diagnosis not present

## 2022-02-06 DIAGNOSIS — E559 Vitamin D deficiency, unspecified: Secondary | ICD-10-CM

## 2022-02-06 MED ORDER — METFORMIN HCL ER 500 MG PO TB24: ORAL_TABLET | ORAL | 0 refills | Status: DC

## 2022-02-06 MED ORDER — VITAMIN D (ERGOCALCIFEROL) 1.25 MG (50000 UNIT) PO CAPS: ORAL_CAPSULE | ORAL | 0 refills | Status: DC

## 2022-02-20 NOTE — Progress Notes (Unsigned)
Chief Complaint:   OBESITY Debra Horn is here to discuss her progress with her obesity treatment plan along with follow-up of her obesity related diagnoses. Debra Horn is on the Category 2 Plan and states she is following her eating plan approximately 50% of the time. Debra Horn states she is not exercising.  Today's visit was #: 35 Starting weight: 244 lbs Starting date: 09/15/2020 Today's weight: 258 lbs Today's date: 02/06/2022 Total lbs lost to date: 14 lbs Total lbs lost since last in-office visit: 15 lbs  Interim History: ***  Subjective:   1. Insulin resistance Patient was started on metformin XR 10/31/2021.  Patient is currently on metformin XR 500 mg daily.  2. Vitamin D deficiency On 09/21/2021, vitamin D level was 25.2.  Patient is taking weekly ergocalciferol.  Patient denies nausea, vomiting, muscle weakness.  Assessment/Plan:   1. Insulin resistance Check labs at next visit.  Refill- metFORMIN (GLUCOPHAGE-XR) 500 MG 24 hr tablet; 11/28/21 one full daily with full meal  Dispense: 30 tablet; Refill: 0  2. Vitamin D deficiency Check labs at next office visit.  Refill- Vitamin D, Ergocalciferol, (DRISDOL) 1.25 MG (50000 UNIT) CAPS capsule; Take once per week  Dispense: 4 capsule; Refill: 0  3. Obesity, current BMI 47.3 Debra Horn is currently in the action stage of change. As such, her goal is to continue with weight loss efforts. She has agreed to the Category 2 Plan.   Exercise goals: No exercise has been prescribed at this time.  Behavioral modification strategies: increasing lean protein intake, decreasing simple carbohydrates, keeping healthy foods in the home, ways to avoid boredom eating, and planning for success.  Debra Horn has agreed to follow-up with our clinic in 4 weeks. She was informed of the importance of frequent follow-up visits to maximize her success with intensive lifestyle modifications for her multiple health conditions.   Objective:   Blood pressure  133/84, pulse 83, temperature 99.3 F (37.4 C), height 5' 2"$  (1.575 m), SpO2 97 %. Body mass index is 50.26 kg/m.  General: Cooperative, alert, well developed, in no acute distress. HEENT: Conjunctivae and lids unremarkable. Cardiovascular: Regular rhythm.  Lungs: Normal work of breathing. Neurologic: No focal deficits.   Lab Results  Component Value Date   CREATININE 0.74 08/18/2021   BUN 13 08/18/2021   NA 140 08/18/2021   K 3.6 08/18/2021   CL 105 08/18/2021   CO2 27 08/18/2021   Lab Results  Component Value Date   ALT 12 08/18/2021   AST 9 08/18/2021   ALKPHOS 75 08/18/2021   BILITOT 0.5 08/18/2021   Lab Results  Component Value Date   HGBA1C 5.6 09/21/2021   HGBA1C 5.6 02/04/2021   HGBA1C 5.4 09/16/2020   HGBA1C 5.0 02/09/2017   HGBA1C 5.6 02/19/2014   Lab Results  Component Value Date   INSULIN 37.1 (H) 09/21/2021   INSULIN 17.6 02/04/2021   INSULIN 16.1 09/16/2020   Lab Results  Component Value Date   TSH 1.68 08/18/2021   Lab Results  Component Value Date   CHOL 155 09/16/2020   HDL 46 09/16/2020   LDLCALC 97 09/16/2020   TRIG 62 09/16/2020   Lab Results  Component Value Date   VD25OH 25.2 (L) 09/21/2021   VD25OH 48.9 02/04/2021   VD25OH 18.1 (L) 09/16/2020   Lab Results  Component Value Date   WBC 8.3 08/18/2021   HGB 11.0 (L) 08/18/2021   HCT 35.1 (L) 08/18/2021   MCV 83.4 08/18/2021   PLT 420.0 (H) 08/18/2021  Lab Results  Component Value Date   IRON 19 (L) 03/07/2021   TIBC 486 (H) 03/07/2021   FERRITIN 6 (L) 03/07/2021   Attestation Statements:   Reviewed by clinician on day of visit: allergies, medications, problem list, medical history, surgical history, family history, social history, and previous encounter notes.  I, Davy Pique, RMA, am acting as Location manager for Mina Marble, NP.  I have reviewed the above documentation for accuracy and completeness, and I agree with the above. -  ***

## 2022-03-06 ENCOUNTER — Ambulatory Visit (INDEPENDENT_AMBULATORY_CARE_PROVIDER_SITE_OTHER): Payer: BC Managed Care – PPO | Admitting: Adult Health

## 2022-03-06 ENCOUNTER — Encounter (INDEPENDENT_AMBULATORY_CARE_PROVIDER_SITE_OTHER): Payer: Self-pay | Admitting: Adult Health

## 2022-03-06 VITALS — BP 133/82 | HR 72 | Temp 98.7°F | Ht 62.0 in | Wt 263.0 lb

## 2022-03-06 DIAGNOSIS — F419 Anxiety disorder, unspecified: Secondary | ICD-10-CM | POA: Diagnosis not present

## 2022-03-06 DIAGNOSIS — E88819 Insulin resistance, unspecified: Secondary | ICD-10-CM

## 2022-03-06 DIAGNOSIS — Z6841 Body Mass Index (BMI) 40.0 and over, adult: Secondary | ICD-10-CM

## 2022-03-06 DIAGNOSIS — F32A Depression, unspecified: Secondary | ICD-10-CM | POA: Diagnosis not present

## 2022-03-06 DIAGNOSIS — E559 Vitamin D deficiency, unspecified: Secondary | ICD-10-CM | POA: Diagnosis not present

## 2022-03-06 MED ORDER — VITAMIN D (ERGOCALCIFEROL) 1.25 MG (50000 UNIT) PO CAPS: ORAL_CAPSULE | ORAL | 0 refills | Status: DC

## 2022-03-06 MED ORDER — METFORMIN HCL ER 500 MG PO TB24: ORAL_TABLET | ORAL | 0 refills | Status: DC

## 2022-03-06 MED ORDER — FLUOXETINE HCL 10 MG PO CAPS: 10.0000 mg | ORAL_CAPSULE | Freq: Every day | ORAL | 0 refills | Status: DC

## 2022-03-06 NOTE — Progress Notes (Signed)
WEIGHT SUMMARY AND BIOMETRICS  Vitals Temp: 98.7 F (37.1 C) BP: 133/82 Pulse Rate: 72 SpO2: 98 %   Anthropometric Measurements Height: '5\' 2"'$  (1.575 m) Weight: 263 lb (119.3 kg) BMI (Calculated): 48.09 Weight at Last Visit: 258lb Weight Lost Since Last Visit: +5lb Starting Weight: 244lb Total Weight Loss (lbs): 9 lb (4.082 kg)   Body Composition  Body Fat %: 52.7 % Fat Mass (lbs): 139 lbs Muscle Mass (lbs): 118.4 lbs Total Body Water (lbs): 85.8 lbs Visceral Fat Rating : 17   Other Clinical Data Fasting: Yes Labs: Yes Today's Visit #: 20 Starting Date: 09/15/20    Chief Complaint:   OBESITY Debra Horn is here to discuss her progress with her obesity treatment plan. She is on the the Category 2 Plan and states she is following her eating plan approximately 80 % of the time.  She states she is exercising gym exercise 45 minutes 3 times per week. She has focused on weight training via [employer].  Interim History:  Her insurance will not over any GLP-1 or GIP/GLP-1 therapy for treatment of Obesity. She was started on Metformin therapy Oct 2023- currently on Metformin XR '500mg'$  QHS- denies GI upset.  Reviewed [employer] results with pt: Muscle Mass + 0.2 lb Adipose Mass + 4.8 lbs Water Weight + 2.4 lbs  Breakfast: 2 boiled eggs Lunch: Salad with baked baked chicken,estimates to consume 4-8 oz protein Dinner: Striving to consume at least 8 oz meat protein, ie: Fish, Chicke, or Steak  [facility] is a VIRTUAL PLATFORM. [facility]- mental health provider started her on Fluoxetine therapy since Dec 2023. She is currently on Fluoxetine '20mg'$ - on this strength for 4 weeks, now off for last 3 weeks- due to lack of OV. She received large bill from [facility] and has not seen in office.  Subjective:   1. Insulin resistance  Latest Reference Range & Units 09/21/21 07:53  INSULIN 2.6 - 24.9 uIU/mL 37.1 (H)  (H): Data is abnormally high Started on Metformin  therapy Oct 2023. Plain Metformin therapy triggered significant GI upset. She was converted to Metformin XR and now tolerating an evening dose of '500mg'$ . Her insurance will not cover any GLP-1 or GIP/GLP-1 therapy for Obesity treatment.  2. Vitamin D deficiency  Latest Reference Range & Units 09/21/21 07:53  Vitamin D, 25-Hydroxy 30.0 - 100.0 ng/mL 25.2 (L)  (L): Data is abnormally low She is currently on weekly Ergocalciferol- denies N/V/Muscle Weakness. She continue to experience fatigue.  3. Depression and Anxiety [facility] is a VIRTUAL PLATFORM. Late Nov 2023 she experienced sx's of depression- Fatigue, sadness, tearfulness- she denies SI/HI then and now. She started with therapist at [facility] provider started her on Fluoxetine therapy since Dec 2023. She is currently on Fluoxetine '20mg'$ - on this strength for 4 weeks, now off for last 3 weeks- due to lack of OV. She received large bill from [facility] and has not seen in office.  When she was on Fluoxetine she reports decreased anxiety, hopelessness, and it was "easier to follow the eating plan".  Current sx's since off SSRI therapy-   Racing thoughts Increase in anxiety  PDMP reviewed- several refills of Lorazepam- per pt she has not used in weeks.  Assessment/Plan:   1. Insulin resistance Check Labs - Comprehensive metabolic panel - Hemoglobin A1c - Insulin, random - Vitamin B12 Refill - metFORMIN (GLUCOPHAGE-XR) 500 MG 24 hr tablet; 11/28/21 one full daily with full meal  Dispense: 30 tablet; Refill: 0  2. Vitamin D deficiency Check Labs - VITAMIN D 25 Hydroxy (Vit-D Deficiency, Fractures) Refill - Vitamin D, Ergocalciferol, (DRISDOL) 1.25 MG (50000 UNIT) CAPS capsule; Take once per week  Dispense: 4 capsule; Refill: 0  3. Depression and Anxiety Re-start  Fluoxetine '10mg'$  QD Disp 90 RF 0  4. Obesity, current BMI 48.09  Debra Horn is currently in the action stage of change. As such,  her goal is to continue with weight loss efforts. She has agreed to the Category 2 Plan.   Exercise goals: For substantial health benefits, adults should do at least 150 minutes (2 hours and 30 minutes) a week of moderate-intensity, or 75 minutes (1 hour and 15 minutes) a week of vigorous-intensity aerobic physical activity, or an equivalent combination of moderate- and vigorous-intensity aerobic activity. Aerobic activity should be performed in episodes of at least 10 minutes, and preferably, it should be spread throughout the week.  Behavioral modification strategies: increasing lean protein intake, decreasing simple carbohydrates, increasing vegetables, increasing water intake, no skipping meals, meal planning and cooking strategies, keeping healthy foods in the home, ways to avoid boredom eating, better snacking choices, emotional eating strategies, and planning for success.  Debra Horn has agreed to follow-up with our clinic in 4 weeks. She was informed of the importance of frequent follow-up visits to maximize her success with intensive lifestyle modifications for her multiple health conditions.   Debra Horn was informed we would discuss her lab results at her next visit unless there is a critical issue that needs to be addressed sooner. Debra Horn agreed to keep her next visit at the agreed upon time to discuss these results.  Objective:   Blood pressure 133/82, pulse 72, temperature 98.7 F (37.1 C), height '5\' 2"'$  (1.575 m), weight 263 lb (119.3 kg), SpO2 98 %. Body mass index is 48.1 kg/m.  General: Cooperative, alert, well developed, in no acute distress. HEENT: Conjunctivae and lids unremarkable. Cardiovascular: Regular rhythm.  Lungs: Normal work of breathing. Neurologic: No focal deficits.   Lab Results  Component Value Date   CREATININE 0.74 08/18/2021   BUN 13 08/18/2021   NA 140 08/18/2021   K 3.6 08/18/2021   CL 105 08/18/2021   CO2 27 08/18/2021   Lab Results  Component Value  Date   ALT 12 08/18/2021   AST 9 08/18/2021   ALKPHOS 75 08/18/2021   BILITOT 0.5 08/18/2021   Lab Results  Component Value Date   HGBA1C 5.6 09/21/2021   HGBA1C 5.6 02/04/2021   HGBA1C 5.4 09/16/2020   HGBA1C 5.0 02/09/2017   HGBA1C 5.6 02/19/2014   Lab Results  Component Value Date   INSULIN 37.1 (H) 09/21/2021   INSULIN 17.6 02/04/2021   INSULIN 16.1 09/16/2020   Lab Results  Component Value Date   TSH 1.68 08/18/2021   Lab Results  Component Value Date   CHOL 155 09/16/2020   HDL 46 09/16/2020   LDLCALC 97 09/16/2020   TRIG 62 09/16/2020   Lab Results  Component Value Date   VD25OH 25.2 (L) 09/21/2021   VD25OH 48.9 02/04/2021   VD25OH 18.1 (L) 09/16/2020   Lab Results  Component Value Date   WBC 8.3 08/18/2021   HGB 11.0 (L) 08/18/2021   HCT 35.1 (L) 08/18/2021   MCV 83.4 08/18/2021   PLT 420.0 (H) 08/18/2021   Lab Results  Component Value Date   IRON 19 (L) 03/07/2021   TIBC 486 (H) 03/07/2021   FERRITIN 6 (L) 03/07/2021  Attestation Statements:   Reviewed by clinician on day of visit: allergies, medications, problem list, medical history, surgical history, family history, social history, and previous encounter notes.  I have reviewed the above documentation for accuracy and completeness, and I agree with the above. -  Daymond Cordts d. Sosha Shepherd, NP-C

## 2022-03-07 LAB — COMPREHENSIVE METABOLIC PANEL
ALT: 23 IU/L (ref 0–32)
AST: 18 IU/L (ref 0–40)
Albumin/Globulin Ratio: 1.2 (ref 1.2–2.2)
Albumin: 3.9 g/dL (ref 3.9–4.9)
Alkaline Phosphatase: 115 IU/L (ref 44–121)
BUN/Creatinine Ratio: 18 (ref 9–23)
BUN: 11 mg/dL (ref 6–20)
Bilirubin Total: 0.2 mg/dL (ref 0.0–1.2)
CO2: 20 mmol/L (ref 20–29)
Calcium: 9.1 mg/dL (ref 8.7–10.2)
Chloride: 103 mmol/L (ref 96–106)
Creatinine, Ser: 0.62 mg/dL (ref 0.57–1.00)
Globulin, Total: 3.3 g/dL (ref 1.5–4.5)
Glucose: 74 mg/dL (ref 70–99)
Potassium: 4.6 mmol/L (ref 3.5–5.2)
Sodium: 139 mmol/L (ref 134–144)
Total Protein: 7.2 g/dL (ref 6.0–8.5)
eGFR: 118 mL/min/{1.73_m2} (ref >59)

## 2022-03-07 LAB — INSULIN, RANDOM: INSULIN: 20.2 u[IU]/mL (ref 2.6–24.9)

## 2022-03-07 LAB — HEMOGLOBIN A1C
Est. average glucose Bld gHb Est-mCnc: 111 mg/dL
Hgb A1c MFr Bld: 5.5 % (ref 4.8–5.6)

## 2022-03-07 LAB — VITAMIN B12: Vitamin B-12: 448 pg/mL (ref 232–1245)

## 2022-03-07 LAB — VITAMIN D 25 HYDROXY (VIT D DEFICIENCY, FRACTURES): Vit D, 25-Hydroxy: 36.9 ng/mL (ref 30.0–100.0)

## 2022-03-11 ENCOUNTER — Other Ambulatory Visit: Payer: Self-pay | Admitting: Primary Care

## 2022-03-27 ENCOUNTER — Ambulatory Visit (INDEPENDENT_AMBULATORY_CARE_PROVIDER_SITE_OTHER): Payer: BC Managed Care – PPO | Admitting: Adult Health

## 2022-03-27 ENCOUNTER — Encounter (INDEPENDENT_AMBULATORY_CARE_PROVIDER_SITE_OTHER): Payer: Self-pay | Admitting: Adult Health

## 2022-03-27 VITALS — BP 131/81 | HR 64 | Temp 98.2°F | Ht 62.0 in | Wt 261.0 lb

## 2022-03-27 DIAGNOSIS — E669 Obesity, unspecified: Secondary | ICD-10-CM | POA: Diagnosis not present

## 2022-03-27 DIAGNOSIS — Z6841 Body Mass Index (BMI) 40.0 and over, adult: Secondary | ICD-10-CM | POA: Diagnosis not present

## 2022-03-27 DIAGNOSIS — E559 Vitamin D deficiency, unspecified: Secondary | ICD-10-CM | POA: Diagnosis not present

## 2022-03-27 DIAGNOSIS — E88819 Insulin resistance, unspecified: Secondary | ICD-10-CM

## 2022-03-27 DIAGNOSIS — G4733 Obstructive sleep apnea (adult) (pediatric): Secondary | ICD-10-CM

## 2022-03-27 MED ORDER — VITAMIN D (ERGOCALCIFEROL) 1.25 MG (50000 UNIT) PO CAPS: ORAL_CAPSULE | ORAL | 0 refills | Status: DC

## 2022-03-27 MED ORDER — METFORMIN HCL ER 500 MG PO TB24: ORAL_TABLET | ORAL | 0 refills | Status: DC

## 2022-03-27 NOTE — Progress Notes (Signed)
WEIGHT SUMMARY AND BIOMETRICS  Vitals Temp: 98.2 F (36.8 C) BP: 131/81 Pulse Rate: 64 SpO2: 98 %   Anthropometric Measurements Height: 5\' 2"  (1.575 m) Weight: 261 lb (118.4 kg) BMI (Calculated): 47.73 Weight at Last Visit: 263lb Weight Lost Since Last Visit: 2lb Weight Gained Since Last Visit: 0 Starting Weight: 244lb Total Weight Loss (lbs): 11 lb (4.99 kg)   Body Composition  Body Fat %: 52 % Fat Mass (lbs): 136 lbs Muscle Mass (lbs): 119 lbs Total Body Water (lbs): 84.6 lbs Visceral Fat Rating : 16   Other Clinical Data Fasting: Yes Labs: No Today's Visit #: 21 Starting Date: 09/15/20    Chief Complaint:   OBESITY Debra Horn is here to discuss her progress with her obesity treatment plan. She is on the the Category 2 Plan and states she is following her eating plan approximately 80 % of the time.  She states she is exercising walking 30 minutes 3 times per week.   Interim History:  Ms. Cramblit has been with [facility] since 09/15/2020. She has lost 11 lbs, however she has dramatically improved her relationship with food and with meal planning and prepping. She has been able to eliminate snacking on almonds, some days up to  2475-3300 calories/day. Her insurance will not cover any form of GLP-1 therapy for Obesity therapy.  She experienced profound apneic event a few weeks ago that scared her and reinforced her desire to loss weight and improve overall health and well being. She attended information session at [facility] and has started process for Bariatric surgery- she produced forms that need to be completed for "Medical Necessity" for her insurance to approve procedure. Ms. Sabat reports that her husband is supportive of her decision.  Subjective:   1. Vitamin D deficiency  Latest Reference Range & Units 09/21/21 07:53 03/06/22 13:08  Vitamin D, 25-Hydroxy 30.0 - 100.0 ng/mL 25.2 (L) 36.9  (L): Data is abnormally low She is on weekly  Ergocalciferol- denies N/V/Muscle Weakness  2. Insulin resistance  Latest Reference Range & Units 03/06/22 13:08  Hemoglobin A1C 4.8 - 5.6 % 5.5  Est. average glucose Bld gHb Est-mCnc mg/dL 111  INSULIN 2.6 - 24.9 uIU/mL 20.2  She is tolerating evening Metformin XR 500mg . Her insurance will not cover any form of GLP-1 therapy.  3. OSA (obstructive sleep apnea) 11/25/21 Pulmonology OV: 11/25/2021- Interim hx  Patient presents today for overdue asthma follow-up.    She was last seen in June 2022. Pulmonary function testing at that time showed normal spirometry without BD response/ FEV1 2.65, ratio 80.    She contacted her PCP on 11/19  for virtual visit d/t asthma exacerbation, she was sent in prednisone 40mg  x 7 days and prn albuterol. She reports wheezing symptoms for approximately 1 week.  She ran out of Flovent inhaler 3 months ago. She uses Albuterol 1-2 times per day. She completed prednisone course which helped. Denies cough or nasal congestion.    She is 100% compliant with CPAP use. She feels CPAP pressure is no longer strong enough. She brought her machine by DME company and everything looked fine. She replaced her mask and supplies. She is sleeping alright at night without significant residual daytime sleepiness.   Airview download 10/24/21-11/22/21 30/30 days used; 97% > 4 hours Average usage 8 hours 5 mins Pressure 5-20cm h20 Airleaks 33.3L/min (95%) AHI 3.0   Assessment & Plan:    Moderate persistent asthma with exacerbation - Increased/varying asthma symptoms off  ICS for the last 3 months ago. Pulmonary function testing in June 2022 was normal without BD response. FENO 36. She was treated for acute asthma exacerbation on 11/20/21 with prednisone taper with improvement in her symptoms. Recommend patient resume Flovent HFA inhaler 2 puffs twice daily and continue Albuterol hfa 2 puffs q 4-6 hours for breakthrough symptoms. Follow-up in 6 months or sooner if needed      Severe sleep apnea - Patient is compliant with CPAP, feels pressure is no longer strong enough. She is having moderate-large amount of airleaks.Events are well controlled on auto pressure settings 5-20cm h20. Recommend she try full face mask.   Assessment/Plan:   1. Vitamin D deficiency Refill - Vitamin D, Ergocalciferol, (DRISDOL) 1.25 MG (50000 UNIT) CAPS capsule; Take once per week  Dispense: 4 capsule; Refill: 0  2. Insulin resistance Refill - metFORMIN (GLUCOPHAGE-XR) 500 MG 24 hr tablet; 11/28/21 one full daily with full meal  Dispense: 30 tablet; Refill: 0  3. OSA (obstructive sleep apnea) Continue with weight loss efforts  4. Obesity, current BMI 48.09  Jenee is currently in the action stage of change. As such, her goal is to continue with weight loss efforts. She has agreed to the Category 2 Plan.   Exercise goals: All adults should avoid inactivity. Some physical activity is better than none, and adults who participate in any amount of physical activity gain some health benefits. For additional and more extensive health benefits, adults should increase their aerobic physical activity to 300 minutes (5 hours) a week of moderate-intensity, or 150 minutes a week of vigorous-intensity aerobic physical activity, or an equivalent combination of moderate- and vigorous-intensity activity. Additional health benefits are gained by engaging in physical activity beyond this amount.  Adults should also include muscle-strengthening activities that involve all major muscle groups on 2 or more days a week.  Behavioral modification strategies: increasing lean protein intake, decreasing simple carbohydrates, increasing vegetables, increasing water intake, and planning for success.  HWW will complete [facility] Forms and then will call pt to pick up.  Izzy has agreed to follow-up with our clinic in 4 weeks. She was informed of the importance of frequent follow-up visits to  maximize her success with intensive lifestyle modifications for her multiple health conditions.   Objective:   Blood pressure 131/81, pulse 64, temperature 98.2 F (36.8 C), height 5\' 2"  (1.575 m), weight 261 lb (118.4 kg), SpO2 98 %. Body mass index is 47.74 kg/m.  General: Cooperative, alert, well developed, in no acute distress. HEENT: Conjunctivae and lids unremarkable. Cardiovascular: Regular rhythm.  Lungs: Normal work of breathing. Neurologic: No focal deficits.   Lab Results  Component Value Date   CREATININE 0.62 03/06/2022   BUN 11 03/06/2022   NA 139 03/06/2022   K 4.6 03/06/2022   CL 103 03/06/2022   CO2 20 03/06/2022   Lab Results  Component Value Date   ALT 23 03/06/2022   AST 18 03/06/2022   ALKPHOS 115 03/06/2022   BILITOT 0.2 03/06/2022   Lab Results  Component Value Date   HGBA1C 5.5 03/06/2022   HGBA1C 5.6 09/21/2021   HGBA1C 5.6 02/04/2021   HGBA1C 5.4 09/16/2020   HGBA1C 5.0 02/09/2017   Lab Results  Component Value Date   INSULIN 20.2 03/06/2022   INSULIN 37.1 (H) 09/21/2021   INSULIN 17.6 02/04/2021   INSULIN 16.1 09/16/2020   Lab Results  Component Value Date   TSH 1.68 08/18/2021   Lab Results  Component Value  Date   CHOL 155 09/16/2020   HDL 46 09/16/2020   LDLCALC 97 09/16/2020   TRIG 62 09/16/2020   Lab Results  Component Value Date   VD25OH 36.9 03/06/2022   VD25OH 25.2 (L) 09/21/2021   VD25OH 48.9 02/04/2021   Lab Results  Component Value Date   WBC 8.3 08/18/2021   HGB 11.0 (L) 08/18/2021   HCT 35.1 (L) 08/18/2021   MCV 83.4 08/18/2021   PLT 420.0 (H) 08/18/2021   Lab Results  Component Value Date   IRON 19 (L) 03/07/2021   TIBC 486 (H) 03/07/2021   FERRITIN 6 (L) 03/07/2021    Attestation Statements:   Reviewed by clinician on day of visit: allergies, medications, problem list, medical history, surgical history, family history, social history, and previous encounter notes.  I have reviewed the above  documentation for accuracy and completeness, and I agree with the above. -  Jamariyah Johannsen d. Lashandra Arauz, NP-C

## 2022-03-28 ENCOUNTER — Telehealth: Payer: BC Managed Care – PPO | Admitting: Nurse Practitioner

## 2022-03-28 DIAGNOSIS — J039 Acute tonsillitis, unspecified: Secondary | ICD-10-CM

## 2022-03-28 MED ORDER — AZITHROMYCIN 250 MG PO TABS: ORAL_TABLET | ORAL | 0 refills | Status: AC

## 2022-03-28 NOTE — Progress Notes (Signed)
Virtual Visit Consent   Debra Horn, you are scheduled for a virtual visit with a [facility] provider today. Just as with appointments in the office, your consent must be obtained to participate. Your consent will be active for this visit and any virtual visit you may have with one of our providers in the next 365 days. If you have a MyChart account, a copy of this consent can be sent to you electronically.  As this is a virtual visit, video technology does not allow for your provider to perform a traditional examination. This may limit your provider's ability to fully assess your condition. If your provider identifies any concerns that need to be evaluated in person or the need to arrange testing (such as labs, EKG, etc.), we will make arrangements to do so. Although advances in technology are sophisticated, we cannot ensure that it will always work on either your end or our end. If the connection with a video visit is poor, the visit may have to be switched to a telephone visit. With either a video or telephone visit, we are not always able to ensure that we have a secure connection.  By engaging in this virtual visit, you consent to the provision of healthcare and authorize for your insurance to be billed (if applicable) for the services provided during this visit. Depending on your insurance coverage, you may receive a charge related to this service.  I need to obtain your verbal consent now. Are you willing to proceed with your visit today? Debra Horn has provided verbal consent on 03/28/2022 for a virtual visit (video or telephone). Apolonio Schneiders, FNP  Date: 03/28/2022 6:07 PM  Virtual Visit via Video Note   I, Apolonio Schneiders, connected with  Debra Horn  ([id number], Aug 16, 1985) on 03/28/22 at  6:15 PM EDT by a video-enabled telemedicine application and verified that I am speaking with the correct person using two identifiers.  Location: Patient: Virtual Visit Location Patient:  Home Provider: Virtual Visit Location Provider: Home Office   I discussed the limitations of evaluation and management by telemedicine and the availability of in person appointments. The patient expressed understanding and agreed to proceed.    History of Present Illness: Debra Horn is a 37 y.o. who identifies as a female who was assigned female at birth, and is being seen today for a sore throat.  Symptom onset was sudden last night  She has had chills and fever today  Feels her neck is swollen and it is painful to swallow  She has body aches today as well  Using tylenol for relief   Denies any contact with anyone known to be sick  She has had COVID in the past most recently May of last year  Symptoms were mild at that time   She does have her tonsils   She denies runny nose, cough or congestion   She was at her doctor's office yesterday for weight management   She did look in the back of her throat and it is very red   Problems:  Patient Active Problem List   Diagnosis Date Noted   Shortness of breath 08/22/2021   Moderate persistent asthma with exacerbation 07/28/2021   Diffuse pain 05/31/2021   Allergic reaction 05/11/2021   Iron deficiency anemia due to chronic blood loss 03/07/2021   Low folate 03/07/2021   Class 3 severe obesity with serious comorbidity and body mass index (BMI) of 40.0 to 44.9 in adult [facility]) 03/03/2021  CRP elevated 12/01/2020   Elevated sed rate 12/01/2020   Nasal drainage 11/09/2020   Insulin resistance 10/26/2020   Other chest pain 10/13/2020   Mild persistent asthma 06/24/2020   Severe sleep apnea 06/24/2020   BMI 45.0-49.9, adult ([place]) 06/24/2020   Status post repeat low transverse cesarean section 07/27/2017   Poor fetal growth affecting management of mother in third trimester    Previous pregnancy complicated by pregnancy-induced hypertension in third trimester, antepartum    IUGR (intrauterine growth restriction) in prior  pregnancy, pregnant 03/06/2017   Vitamin D deficiency 02/17/2017   History of preterm delivery 02/09/2017   History of asthma    Normocytic anemia 02/06/2016   Family history of systemic lupus erythematosus (SLE) in mother 02/19/2014   Hyperthyroidism 02/19/2014   Previous cesarean section 08/08/2012   History of pulmonary embolus (PE) 07/24/2012    Allergies:  Allergies  Allergen Reactions   Banana Anaphylaxis   Cefixime Rash   Medications:  Current Outpatient Medications:    albuterol (VENTOLIN HFA) 108 (90 Base) MCG/ACT inhaler, TAKE 2 PUFFS BY MOUTH EVERY 6 HOURS AS NEEDED FOR WHEEZE OR SHORTNESS OF BREATH, Disp: 18 each, Rfl: 3   famotidine (PEPCID) 20 MG tablet, Take 1 tablet (20 mg total) by mouth 2 (two) times daily., Disp: 30 tablet, Rfl: 0   FLUoxetine (PROZAC) 10 MG capsule, Take 1 capsule (10 mg total) by mouth daily., Disp: 90 capsule, Rfl: 0   fluticasone (FLONASE) 50 MCG/ACT nasal spray, SPRAY 2 SPRAYS INTO EACH NOSTRIL EVERY DAY (Patient taking differently: Place 2 sprays into both nostrils daily.), Disp: 16 mL, Rfl: 0   fluticasone (FLOVENT HFA) 110 MCG/ACT inhaler, Inhale 2 puffs into the lungs in the morning and at bedtime., Disp: 1 each, Rfl: 11   folic acid (FOLVITE) 1 MG tablet, TAKE 1 TABLET BY MOUTH EVERY DAY, Disp: 90 tablet, Rfl: 1   loratadine (CLARITIN) 10 MG tablet, Take 1 tablet (10 mg total) by mouth daily., Disp: 30 tablet, Rfl: 11   metFORMIN (GLUCOPHAGE-XR) 500 MG 24 hr tablet, 11/28/21 one full daily with full meal, Disp: 30 tablet, Rfl: 0   Vitamin D, Ergocalciferol, (DRISDOL) 1.25 MG (50000 UNIT) CAPS capsule, Take once per week, Disp: 4 capsule, Rfl: 0  Observations/Objective: Patient is well-developed, well-nourished in no acute distress.  Resting comfortably  at home.  Head is normocephalic, atraumatic.  No labored breathing.  Speech is clear and coherent with logical content.  Patient is alert and oriented at baseline.    Assessment and  Plan: 1. Tonsillitis  - azithromycin (ZITHROMAX) 250 MG tablet; Take 2 tablets on day 1, then 1 tablet daily on days 2 through 5  Dispense: 6 tablet; Refill: 0    Alternate tylenol and ibuprofen for pain relief  Push fluids  Assure hydration and rest  Follow up with new or worsening symptoms as discussed    Follow Up Instructions: I discussed the assessment and treatment plan with the patient. The patient was provided an opportunity to ask questions and all were answered. The patient agreed with the plan and demonstrated an understanding of the instructions.  A copy of instructions were sent to the patient via MyChart unless otherwise noted below.   The patient was advised to call back or seek an in-person evaluation if the symptoms worsen or if the condition fails to improve as anticipated.  Time:  I spent 15 minutes with the patient via telehealth technology discussing the above problems/concerns.    Apolonio Schneiders,  FNP

## 2022-03-29 DIAGNOSIS — G4733 Obstructive sleep apnea (adult) (pediatric): Secondary | ICD-10-CM | POA: Diagnosis not present

## 2022-04-06 ENCOUNTER — Ambulatory Visit: Payer: Medicaid Other | Admitting: Family Medicine

## 2022-04-13 ENCOUNTER — Ambulatory Visit: Payer: Medicaid Other | Admitting: Family Medicine

## 2022-04-14 DIAGNOSIS — E559 Vitamin D deficiency, unspecified: Secondary | ICD-10-CM | POA: Diagnosis not present

## 2022-04-14 DIAGNOSIS — Z9889 Other specified postprocedural states: Secondary | ICD-10-CM | POA: Diagnosis not present

## 2022-04-14 DIAGNOSIS — E0590 Thyrotoxicosis, unspecified without thyrotoxic crisis or storm: Secondary | ICD-10-CM | POA: Diagnosis not present

## 2022-04-14 DIAGNOSIS — K219 Gastro-esophageal reflux disease without esophagitis: Secondary | ICD-10-CM | POA: Diagnosis not present

## 2022-04-14 DIAGNOSIS — D649 Anemia, unspecified: Secondary | ICD-10-CM | POA: Diagnosis not present

## 2022-04-14 DIAGNOSIS — J45909 Unspecified asthma, uncomplicated: Secondary | ICD-10-CM | POA: Diagnosis not present

## 2022-04-14 DIAGNOSIS — R519 Headache, unspecified: Secondary | ICD-10-CM | POA: Diagnosis not present

## 2022-04-14 DIAGNOSIS — G4733 Obstructive sleep apnea (adult) (pediatric): Secondary | ICD-10-CM | POA: Diagnosis not present

## 2022-04-14 DIAGNOSIS — I2699 Other pulmonary embolism without acute cor pulmonale: Secondary | ICD-10-CM | POA: Diagnosis not present

## 2022-04-14 DIAGNOSIS — R112 Nausea with vomiting, unspecified: Secondary | ICD-10-CM | POA: Diagnosis not present

## 2022-04-14 DIAGNOSIS — E88819 Insulin resistance, unspecified: Secondary | ICD-10-CM | POA: Diagnosis not present

## 2022-04-17 ENCOUNTER — Other Ambulatory Visit (HOSPITAL_COMMUNITY): Payer: Self-pay | Admitting: Surgery

## 2022-04-17 ENCOUNTER — Encounter (INDEPENDENT_AMBULATORY_CARE_PROVIDER_SITE_OTHER): Payer: Self-pay | Admitting: Adult Health

## 2022-04-17 ENCOUNTER — Ambulatory Visit (INDEPENDENT_AMBULATORY_CARE_PROVIDER_SITE_OTHER): Payer: BC Managed Care – PPO | Admitting: Adult Health

## 2022-04-17 VITALS — BP 130/76 | HR 84 | Temp 98.1°F | Ht 62.0 in | Wt 258.0 lb

## 2022-04-17 DIAGNOSIS — E669 Obesity, unspecified: Secondary | ICD-10-CM

## 2022-04-17 DIAGNOSIS — Z6841 Body Mass Index (BMI) 40.0 and over, adult: Secondary | ICD-10-CM | POA: Diagnosis not present

## 2022-04-17 DIAGNOSIS — E559 Vitamin D deficiency, unspecified: Secondary | ICD-10-CM

## 2022-04-17 DIAGNOSIS — Z7689 Persons encountering health services in other specified circumstances: Secondary | ICD-10-CM | POA: Diagnosis not present

## 2022-04-17 DIAGNOSIS — E88819 Insulin resistance, unspecified: Secondary | ICD-10-CM | POA: Diagnosis not present

## 2022-04-17 MED ORDER — VITAMIN D (ERGOCALCIFEROL) 1.25 MG (50000 UNIT) PO CAPS: ORAL_CAPSULE | ORAL | 0 refills | Status: DC

## 2022-04-17 MED ORDER — METFORMIN HCL ER 500 MG PO TB24: ORAL_TABLET | ORAL | 0 refills | Status: DC

## 2022-04-17 NOTE — Progress Notes (Unsigned)
WEIGHT SUMMARY AND BIOMETRICS  Vitals Temp: 98.1 F (36.7 C) BP: 130/76 Pulse Rate: 84 SpO2: 100 %   Anthropometric Measurements Height:  (1.575 m) Weight: 258 lb (117 kg) BMI (Calculated): 47.18 Weight at Last Visit: 261lb Weight Lost Since Last Visit: 3lb Weight Gained Since Last Visit: 0 Starting Weight: 244lb Total Weight Loss (lbs): 14 lb (6.35 kg)   Body Composition  Body Fat %: 52.5 % Fat Mass (lbs): 135.6 lbs Muscle Mass (lbs): 116.6 lbs Total Body Water (lbs): 85.4 lbs Visceral Fat Rating : 16   Other Clinical Data Fasting: Yes Labs: No Today's Visit #: 22 Starting Date: 09/15/20    Chief Complaint:   OBESITY Debra Horn is here to discuss her progress with her obesity treatment plan. She is on the the Category 2 Plan and states she is following her eating plan approximately 90 % of the time. She states she is exercising walking minutes 30 times per week.   Interim History:  [facility] Paperwork completed and faxed- 03/28/2022 She has meet with surgeon/Dr. Fredricka Bonine on 04/14/2022 She believes anticipated surigcal date for sleeve gastrectomy on/about 06/16/2022  She has increased plan compliance of Cat 3 meal plan to 90% and increased 30 min walks to 5 x weekly. She states "I have the tools to be successful after the surgery". She has the support of her husband and mother for post surgical recovery.  Subjective:   1. Vitamin D deficiency  Latest Reference Range & Units 09/21/21 07:53 03/06/22 13:08  Vitamin D, 25-Hydroxy 30.0 - 100.0 ng/mL 25.2 (L) 36.9  (L): Data is abnormally low She is on bi-weekly Ergocalciferol- denies N/V/Muscle Weakness.  2. Insulin resistance  Latest Reference Range & Units 09/21/21 07:53 03/06/22 13:08  INSULIN 2.6 - 24.9 uIU/mL 37.1 (H) 20.2  (H): Data is abnormally high He insurance will not cover any form of GLP-1 Therapy. She is currently on nightly Metofmrin XR - tolerating  well  3. Encounter for gastric sleeve procedure  OV with Dr. Cindy Hazy Surgery Notes-  37 year old woman who presents for consultation regarding surgical management of morbid obesity. She has followed with [facility] in September 2022, and while she reports that she has dramatically improved her relationship with food, meal planning and prepping, she has only lost 11 pounds in that interval. Her insurance will not cover any form of GLP-1 therapy. One of her biggest concerns surrounding her weight is that she has severe sleep apnea. She has had some frightening events surrounding this. She also has asthma and chronic issues with shortness of breath. She has been told by many of her physicians that much of this will improve with weight loss. She has made an earnest effort to do this but with minimal and transient success. She reports that she really does not eat much. She does not exercise. She works from home, in [employer] for [employer]. She does have a special needs son. Previous abdominal surgery includes C-section x 3, tubal ligation 2019 with last C-section  Cardiac cath in 2022 was negative  Family history notable for lupus and rheumatoid arthritis in her mother, hypertension and diabetes as well as cancer in her father, as well as hypertension, stroke, diabetes other family members. She reports family history of obesity on her mother side but not much on the father side.  No nicotine/alcohol/drug use   PLAN- She is interested in sleeve gastrectomy, and I think she is a good candidate  for this. She has no reflux issues.  We discussed the surgery including relevant anatomy using a diagram to demonstrate, technical aspects, the risks of bleeding, infection, pain, scarring, injury to intra-abdominal structures, staple line/anastomotic leak or abscess, chronic abdominal pain or nausea, new onset or worsened GERD, constipation, malnutrition/nutrient  deficiency, DVT/PE, pneumonia, heart attack, stroke, death, failure to reach weight loss goals and weight regain, hernia. We also briefly went over Roux-en-Y gastric bypass and the different merits and trade-offs of that operation as well. Discussed the typical pre-, peri-, and postoperative course. Discussed the importance of lifelong behavioral changes to combat the chronic and relapsing disease which is obesity. Questions welcomed and answered to her satisfaction. Will proceed with the bariatric pathway towards sleeve gastrectomy.  Assessment/Plan:   1. Vitamin D deficiency Refill - Vitamin D, Ergocalciferol, (DRISDOL) 1.25 MG (50000 UNIT) CAPS capsule; Take once per week  Dispense: 4 capsule; Refill: 0  2. Insulin resistance Refill - metFORMIN (GLUCOPHAGE-XR) 500 MG 24 hr tablet; 11/28/21 one full daily with full meal  Dispense: 30 tablet; Refill: 0  3. Encounter for gastric sleeve procedure Continue healthy eating, regular walking and F/U with [facility] as directed.  4. Obesity, current BMI 48.09  Toni is currently in the action stage of change. As such, her goal is to continue with weight loss efforts. She has agreed to the Category 2 Plan.   Exercise goals: Continue regular walking  Behavioral modification strategies: increasing lean protein intake, decreasing simple carbohydrates, increasing vegetables, increasing water intake, and planning for success.  Debra Horn has agreed to follow-up with our clinic in 4 weeks. She was informed of the importance of frequent follow-up visits to maximize her success with intensive lifestyle modifications for her multiple health conditions.    Objective:   Blood pressure 130/76, pulse 84, temperature 98.1 F (36.7 C), height  (1.575 m), weight 258 lb (117 kg), SpO2 100 %. Body mass index is 47.19 kg/m.  General: Cooperative, alert, well developed, in no acute distress. HEENT: Conjunctivae and lids unremarkable. Cardiovascular: Regular rhythm.   Lungs: Normal work of breathing. Neurologic: No focal deficits.   Lab Results  Component Value Date   CREATININE 0.62 03/06/2022   BUN 11 03/06/2022   NA 139 03/06/2022   K 4.6 03/06/2022   CL 103 03/06/2022   CO2 20 03/06/2022   Lab Results  Component Value Date   ALT 23 03/06/2022   AST 18 03/06/2022   ALKPHOS 115 03/06/2022   BILITOT 0.2 03/06/2022   Lab Results  Component Value Date   HGBA1C 5.5 03/06/2022   HGBA1C 5.6 09/21/2021   HGBA1C 5.6 02/04/2021   HGBA1C 5.4 09/16/2020   HGBA1C 5.0 02/09/2017   Lab Results  Component Value Date   INSULIN 20.2 03/06/2022   INSULIN 37.1 (H) 09/21/2021   INSULIN 17.6 02/04/2021   INSULIN 16.1 09/16/2020   Lab Results  Component Value Date   TSH 1.68 08/18/2021   Lab Results  Component Value Date   CHOL 155 09/16/2020   HDL 46 09/16/2020   LDLCALC 97 09/16/2020   TRIG 62 09/16/2020   Lab Results  Component Value Date   VD25OH 36.9 03/06/2022   VD25OH 25.2 (L) 09/21/2021   VD25OH 48.9 02/04/2021   Lab Results  Component Value Date   WBC 8.3 08/18/2021   HGB 11.0 (L) 08/18/2021   HCT 35.1 (L) 08/18/2021   MCV 83.4 08/18/2021   PLT 420.0 (H) 08/18/2021   Lab Results  Component Value Date  IRON 19 (L) 03/07/2021   TIBC 486 (H) 03/07/2021   FERRITIN 6 (L) 03/07/2021   Attestation Statements:   Reviewed by clinician on day of visit: allergies, medications, problem list, medical history, surgical history, family history, social history, and previous encounter notes.  I have reviewed the above documentation for accuracy and completeness, and I agree with the above. -  Saber Dickerman d. Shuronda Santino, NP-C

## 2022-04-18 ENCOUNTER — Encounter (INDEPENDENT_AMBULATORY_CARE_PROVIDER_SITE_OTHER): Payer: Self-pay | Admitting: Adult Health

## 2022-04-26 ENCOUNTER — Ambulatory Visit (HOSPITAL_COMMUNITY)
Admission: RE | Admit: 2022-04-26 | Discharge: 2022-04-26 | Disposition: A | Discharge status: Home / Self Care | Payer: BC Managed Care – PPO | Source: Ambulatory Visit | Attending: Surgery | Admitting: Surgery

## 2022-04-26 ENCOUNTER — Encounter (HOSPITAL_COMMUNITY)
Admission: RE | Admit: 2022-04-26 | Discharge: 2022-04-26 | Disposition: A | Discharge status: Home / Self Care | Payer: BC Managed Care – PPO | Source: Ambulatory Visit | Attending: Surgery | Admitting: Surgery

## 2022-04-26 ENCOUNTER — Other Ambulatory Visit: Payer: Self-pay

## 2022-04-26 DIAGNOSIS — Z01818 Encounter for other preprocedural examination: Secondary | ICD-10-CM | POA: Insufficient documentation

## 2022-04-26 DIAGNOSIS — K219 Gastro-esophageal reflux disease without esophagitis: Secondary | ICD-10-CM | POA: Diagnosis not present

## 2022-04-26 DIAGNOSIS — Z0389 Encounter for observation for other suspected diseases and conditions ruled out: Secondary | ICD-10-CM | POA: Diagnosis not present

## 2022-04-27 ENCOUNTER — Ambulatory Visit: Payer: Medicaid Other | Admitting: Dietician

## 2022-05-10 ENCOUNTER — Telehealth: Payer: BC Managed Care – PPO | Admitting: Nurse Practitioner

## 2022-05-10 DIAGNOSIS — J4541 Moderate persistent asthma with (acute) exacerbation: Secondary | ICD-10-CM | POA: Diagnosis not present

## 2022-05-10 DIAGNOSIS — J40 Bronchitis, not specified as acute or chronic: Secondary | ICD-10-CM

## 2022-05-10 MED ORDER — PREDNISONE 20 MG PO TABS
20.0000 mg | ORAL_TABLET | Freq: Two times a day (BID) | ORAL | 0 refills | Status: DC
Start: 2022-05-10 — End: 2022-05-15

## 2022-05-10 MED ORDER — BENZONATATE 100 MG PO CAPS
100.0000 mg | ORAL_CAPSULE | Freq: Three times a day (TID) | ORAL | 0 refills | Status: DC | PRN
Start: 2022-05-10 — End: 2022-05-15

## 2022-05-10 MED ORDER — ALBUTEROL SULFATE HFA 108 (90 BASE) MCG/ACT IN AERS
2.0000 | INHALATION_SPRAY | Freq: Four times a day (QID) | RESPIRATORY_TRACT | 2 refills | Status: DC | PRN
Start: 2022-05-10 — End: 2023-01-23

## 2022-05-10 MED ORDER — FLUTICASONE PROPIONATE HFA 110 MCG/ACT IN AERO: 2.0000 | INHALATION_SPRAY | Freq: Two times a day (BID) | RESPIRATORY_TRACT | 11 refills | Status: DC

## 2022-05-10 NOTE — Progress Notes (Signed)
E-Visit for Cough  We are sorry that you are not feeling well.  Here is how we plan to help!  Based on your presentation I believe you most likely have A cough due to a virus.  This is called viral bronchitis and is best treated by rest, plenty of fluids and control of the cough.  You may use Ibuprofen or Tylenol as directed to help your symptoms.     In addition you may use A prescription cough medication called Tessalon Perles 100mg . You may take 1-2 capsules every 8 hours as needed for your cough.  We will prescribe a 5 day burst of Prednisone and  We will also refill both of your inhalers. The ProAIr and the Flovent   Your goal should be to use the ProAir (Albuterol) as needed and the Flovent should be used daily as directed  Meds ordered this encounter  Medications   fluticasone (FLOVENT HFA) 110 MCG/ACT inhaler    Sig: Inhale 2 puffs into the lungs in the morning and at bedtime.    Dispense:  1 each    Refill:  11   predniSONE (DELTASONE) 20 MG tablet    Sig: Take 1 tablet (20 mg total) by mouth 2 (two) times daily with a meal for 5 days.    Dispense:  10 tablet    Refill:  0   benzonatate (TESSALON) 100 MG capsule    Sig: Take 1 capsule (100 mg total) by mouth 3 (three) times daily as needed.    Dispense:  30 capsule    Refill:  0   albuterol (VENTOLIN HFA) 108 (90 Base) MCG/ACT inhaler    Sig: Inhale 2 puffs into the lungs every 6 (six) hours as needed for wheezing or shortness of breath.    Dispense:  8 g    Refill:  2     From your responses in the eVisit questionnaire you describe inflammation in the upper respiratory tract which is causing a significant cough.  This is commonly called Bronchitis and has four common causes:   Allergies Viral Infections Acid Reflux Bacterial Infection Allergies, viruses and acid reflux are treated by controlling symptoms or eliminating the cause. An example might be a cough caused by taking certain blood pressure medications. You stop  the cough by changing the medication. Another example might be a cough caused by acid reflux. Controlling the reflux helps control the cough.  USE OF BRONCHODILATOR ("RESCUE") INHALERS: There is a risk from using your bronchodilator too frequently.  The risk is that over-reliance on a medication which only relaxes the muscles surrounding the breathing tubes can reduce the effectiveness of medications prescribed to reduce swelling and congestion of the tubes themselves.  Although you feel brief relief from the bronchodilator inhaler, your asthma may actually be worsening with the tubes becoming more swollen and filled with mucus.  This can delay other crucial treatments, such as oral steroid medications. If you need to use a bronchodilator inhaler daily, several times per day, you should discuss this with your provider.  There are probably better treatments that could be used to keep your asthma under control.     HOME CARE Only take medications as instructed by your medical team. Complete the entire course of an antibiotic. Drink plenty of fluids and get plenty of rest. Avoid close contacts especially the very young and the elderly Cover your mouth if you cough or cough into your sleeve. Always remember to wash your hands A steam or  ultrasonic humidifier can help congestion.   GET HELP RIGHT AWAY IF: You develop worsening fever. You become short of breath You cough up blood. Your symptoms persist after you have completed your treatment plan MAKE SURE YOU  Understand these instructions. Will watch your condition. Will get help right away if you are not doing well or get worse.    Thank you for choosing an e-visit.  Your e-visit answers were reviewed by a board certified advanced clinical practitioner to complete your personal care plan. Depending upon the condition, your plan could have included both over the counter or prescription medications.  Please review your pharmacy choice. Make  sure the pharmacy is open so you can pick up prescription now. If there is a problem, you may contact your provider through [employer] and have the prescription routed to another pharmacy.  Your safety is important to [place]. If you have drug allergies check your prescription carefully.   For the next 24 hours you can use MyChart to ask questions about today's visit, request a non-urgent call back, or ask for a work or school excuse. You will get an email in the next two days asking about your experience. I hope that your e-visit has been valuable and will speed your recovery.   I spent approximately 5 minutes reviewing the patient's history, current symptoms and coordinating their care today.

## 2022-05-11 ENCOUNTER — Telehealth: Payer: Self-pay | Admitting: Internal Medicine

## 2022-05-11 NOTE — Telephone Encounter (Signed)
Scheduled per 05/03 in-basket message, patient has been called and notified.

## 2022-05-15 ENCOUNTER — Encounter (INDEPENDENT_AMBULATORY_CARE_PROVIDER_SITE_OTHER): Payer: Self-pay | Admitting: Adult Health

## 2022-05-15 ENCOUNTER — Ambulatory Visit (INDEPENDENT_AMBULATORY_CARE_PROVIDER_SITE_OTHER): Payer: BC Managed Care – PPO | Admitting: Adult Health

## 2022-05-15 VITALS — BP 133/83 | HR 76 | Temp 98.2°F | Ht 62.0 in | Wt 267.0 lb

## 2022-05-15 DIAGNOSIS — Z6841 Body Mass Index (BMI) 40.0 and over, adult: Secondary | ICD-10-CM | POA: Diagnosis not present

## 2022-05-15 DIAGNOSIS — E669 Obesity, unspecified: Secondary | ICD-10-CM

## 2022-05-15 DIAGNOSIS — D649 Anemia, unspecified: Secondary | ICD-10-CM | POA: Diagnosis not present

## 2022-05-15 DIAGNOSIS — E88819 Insulin resistance, unspecified: Secondary | ICD-10-CM

## 2022-05-15 DIAGNOSIS — E559 Vitamin D deficiency, unspecified: Secondary | ICD-10-CM

## 2022-05-15 MED ORDER — FLUOXETINE HCL 10 MG PO CAPS: 10.0000 mg | ORAL_CAPSULE | Freq: Every day | ORAL | 0 refills | Status: DC

## 2022-05-15 MED ORDER — VITAMIN D (ERGOCALCIFEROL) 1.25 MG (50000 UNIT) PO CAPS
ORAL_CAPSULE | ORAL | 0 refills | Status: DC
Start: 2022-05-15 — End: 2022-09-26

## 2022-05-15 MED ORDER — METFORMIN HCL ER 500 MG PO TB24
ORAL_TABLET | ORAL | 0 refills | Status: DC
Start: 2022-05-15 — End: 2022-09-26

## 2022-05-15 NOTE — Progress Notes (Signed)
WEIGHT SUMMARY AND BIOMETRICS  Vitals Temp: 98.2 F (36.8 C) BP: 133/83 Pulse Rate: 76 SpO2: 98 %   Anthropometric Measurements Height: 5\' 2"  (1.575 m) Weight: 267 lb (121.1 kg) BMI (Calculated): 48.82 Weight at Last Visit: 258lb Weight Lost Since Last Visit: 0 Weight Gained Since Last Visit: 9lb Starting Weight: 244lb Total Weight Loss (lbs): 5 lb (2.268 kg)   Body Composition  Body Fat %: 53.7 % Fat Mass (lbs): 143.6 lbs Muscle Mass (lbs): 117.6 lbs Total Body Water (lbs): 89.4 lbs Visceral Fat Rating : 17   Other Clinical Data Fasting: yes Labs: no Today's Visit #: 23 Starting Date: 09/15/20    Chief Complaint:   OBESITY Debra is here to discuss her progress with her obesity treatment plan. She is on the the Category 2 Plan and states she is following her eating plan approximately 75 % of the time.  She states she is exercising Walking 30 minutes 3 times per week.   Interim History:  Ms. Horn was terminated from [employer]. She has an upcoming interview with [employer]- she is optimistic that she will find gainful employment soon.  Bariatric surgery has been placed on hiatus for minimum of 3 months, re: untreated normocytic anemia.  Hunger/appetite-She endorses increased polyphagia r/t to stress from unemployment. 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  Exercise-Walking: 20 mins at least 3 times per week.  Reviewed Bioimpedance Results with pt: Muscle Mass: + 1 lbs Adipose Mass: + 8 lbs  Subjective:   1. Normocytic anemia She was initially referred to Hematology/Oncology Fall 2022- then lost the follow-up Baritatic- [facility] has placed her surgery on hold for at least 3 months- until anemia is evaluated/treated.  2. Insulin resistance  Latest Reference Range & Units 09/21/21 07:53 03/06/22 13:08  INSULIN 2.6 - 24.9 uIU/mL 37.1  (H) 20.2  (H): Data is abnormally high Lab Results  Component Value Date   HGBA1C 5.5 03/06/2022   HGBA1C 5.6 09/21/2021   HGBA1C 5.6 02/04/2021   Currently tolerating Metforming XR 500mg  QD She is unable to tolerated plain formulation.  3. Vitamin D deficiency  Latest Reference Range & Units 09/21/21 07:53 03/06/22 13:08  Vitamin D, 25-Hydroxy 30.0 - 100.0 ng/mL 25.2 (L) 36.9  (L): Data is abnormally low She is on weekly Ergocalciferol- denies N/V/Muscle Weakness She endorses fatigue  Assessment/Plan:   1. Normocytic anemia F/U with Hematology/Oncology  2. Insulin resistance Refill - metFORMIN (GLUCOPHAGE-XR) 500 MG 24 hr tablet; 11/28/21 one full daily with full meal  Dispense: 30 tablet; Refill: 0  3. Vitamin D deficiency Refill - Vitamin D, Ergocalciferol, (DRISDOL) 1.25 MG (50000 UNIT) CAPS capsule; Take once per week  Dispense: 4 capsule; Refill: 0  4. Obesity, current BMI 48.82  Debra Horn is currently in the action stage of change. As such, her goal is to continue with weight loss efforts. She has agreed to the Category 2 Plan.   Exercise goals: All adults should avoid inactivity. Some physical activity is better than none, and adults who participate in any amount of physical activity gain some health benefits. For additional and more extensive health benefits, adults should increase their aerobic physical activity to 300 minutes (5 hours) a week of moderate-intensity, or 150 minutes a week of vigorous-intensity aerobic physical activity, or an equivalent combination of moderate- and vigorous-intensity activity. Additional health benefits are gained by engaging in physical  activity beyond this amount.   Behavioral modification strategies: increasing lean protein intake, decreasing simple carbohydrates, increasing vegetables, increasing water intake, increasing high fiber foods, decreasing eating out, no skipping meals, meal planning and cooking strategies, dealing with family  or coworker sabotage, and planning for success.  Debra Horn has agreed to follow-up with our clinic in 4 weeks. She was informed of the importance of frequent follow-up visits to maximize her success with intensive lifestyle modifications for her multiple health conditions.   Objective:   Blood pressure 133/83, pulse 76, temperature 98.2 F (36.8 C), height 5\' 2"  (1.575 m), weight 267 lb (121.1 kg), SpO2 98 %. Body mass index is 48.83 kg/m.  General: Cooperative, alert, well developed, in no acute distress. HEENT: Conjunctivae and lids unremarkable. Cardiovascular: Regular rhythm.  Lungs: Normal work of breathing. Neurologic: No focal deficits.   Lab Results  Component Value Date   CREATININE 0.62 03/06/2022   BUN 11 03/06/2022   NA 139 03/06/2022   K 4.6 03/06/2022   CL 103 03/06/2022   CO2 20 03/06/2022   Lab Results  Component Value Date   ALT 23 03/06/2022   AST 18 03/06/2022   ALKPHOS 115 03/06/2022   BILITOT 0.2 03/06/2022   Lab Results  Component Value Date   HGBA1C 5.5 03/06/2022   HGBA1C 5.6 09/21/2021   HGBA1C 5.6 02/04/2021   HGBA1C 5.4 09/16/2020   HGBA1C 5.0 02/09/2017   Lab Results  Component Value Date   INSULIN 20.2 03/06/2022   INSULIN 37.1 (H) 09/21/2021   INSULIN 17.6 02/04/2021   INSULIN 16.1 09/16/2020   Lab Results  Component Value Date   TSH 1.68 08/18/2021   Lab Results  Component Value Date   CHOL 155 09/16/2020   HDL 46 09/16/2020   LDLCALC 97 09/16/2020   TRIG 62 09/16/2020   Lab Results  Component Value Date   VD25OH 36.9 03/06/2022   VD25OH 25.2 (L) 09/21/2021   VD25OH 48.9 02/04/2021   Lab Results  Component Value Date   WBC 8.3 08/18/2021   HGB 11.0 (L) 08/18/2021   HCT 35.1 (L) 08/18/2021   MCV 83.4 08/18/2021   PLT 420.0 (H) 08/18/2021   Lab Results  Component Value Date   IRON 19 (L) 03/07/2021   TIBC 486 (H) 03/07/2021   FERRITIN 6 (L) 03/07/2021   Attestation Statements:   Reviewed by clinician on day of  visit: allergies, medications, problem list, medical history, surgical history, family history, social history, and previous encounter notes.  I have reviewed the above documentation for accuracy and completeness, and I agree with the above. -  Dream Nodal d. Brandell Maready, NP-C

## 2022-05-23 NOTE — Progress Notes (Unsigned)
[facility] OFFICE PROGRESS NOTE  Avanell Shackleton, NP-C [address] [place] [address]  DIAGNOSIS: Persistent iron deficiency anemia secondary to menorrhagia. The patient also has thrombocytosis that is reactive in nature secondary to the iron deficiency. She is unable to correct her iron deficiency with the oral supplements.   PRIOR THERAPY: None   CURRENT THERAPY: 1) IV iron PRN with venofer 300 mg weekly, most recent dose on 04/14/22. She only received one dose due to infusion reaction. Will switch to feraheme moving forward with premedications with benadryl and tylenol 2) Folic acid 1 mg p.o. daily 3) Oral iron supplements, will restart today on 05/25/22  INTERVAL HISTORY: Debra Horn 37 y.o. female returns to the clinic today for a follow-up visit.  The patient establish care with a clinic on 03/07/2021.  She has not been seen since that time. She was seen for iron deficiency anemia secondary to menorrhagia. She also was found to have folic acid deficiency and Dr. Arbutus Ped prescribed folic acid 1 mg p.o. daily. She ran out of this and has not been taking this. She also has not been taking iron supplements recently. She does not recall having intolerance to oral iron supplements upon questioning today. He also arrange for IV iron last year with Venofer but she had a reaction after 1 cycle did not have any further infusions at that time. She reports spiking a fever and having hallucinations. She did not have any premedications per chart review.   The patient is being considered for bariatric surgery; however, her surgeon would like for her anemia to improve prior to possible surgery. They will re-evaluate her in 3 months.   Overall, the patient states that she feels "okay".  She reports low energy at baseline.  Denies any dizziness.  She sometimes gets dyspnea exertion.  She still has heavy menstrual cycles, although her most recent menstrual cycle was lighter than  usual.  She is not able to take birth control due to her history of pulmonary embolism from birth control.  She denies any other abnormal bleeding or bruising.  She is here today for evaluation and repeat blood work.  MEDICAL HISTORY: Past Medical History:  Diagnosis Date   Anemia    low iron   Asthma    Complication of anesthesia    bp dropped in middle of last c-section and started with n/v   GERD (gastroesophageal reflux disease)    during pregnancy   Headache    "couple days/week" (10/28/2015)   Hyperthyroidism    not on medication   PE (pulmonary embolism) 11/2011   PONV (postoperative nausea and vomiting)    Pregnancy induced hypertension    Preterm labor    Sleep apnea    SOB (shortness of breath)    Thyroid condition    Vitamin D deficiency     ALLERGIES:  is allergic to banana and cefixime.  MEDICATIONS:  Current Outpatient Medications  Medication Sig Dispense Refill   albuterol (VENTOLIN HFA) 108 (90 Base) MCG/ACT inhaler TAKE 2 PUFFS BY MOUTH EVERY 6 HOURS AS NEEDED FOR WHEEZE OR SHORTNESS OF BREATH 18 each 3   albuterol (VENTOLIN HFA) 108 (90 Base) MCG/ACT inhaler Inhale 2 puffs into the lungs every 6 (six) hours as needed for wheezing or shortness of breath. 8 g 2   FLUoxetine (PROZAC) 10 MG capsule Take 1 capsule (10 mg total) by mouth daily. 90 capsule 0   fluticasone (FLONASE) 50 MCG/ACT nasal spray SPRAY  2 SPRAYS INTO EACH NOSTRIL EVERY DAY (Patient taking differently: Place 2 sprays into both nostrils daily.) 16 mL 0   fluticasone (FLOVENT HFA) 110 MCG/ACT inhaler Inhale 2 puffs into the lungs in the morning and at bedtime. 1 each 11   folic acid (FOLVITE) 1 MG tablet TAKE 1 TABLET BY MOUTH EVERY DAY 90 tablet 1   loratadine (CLARITIN) 10 MG tablet Take 1 tablet (10 mg total) by mouth daily. 30 tablet 11   metFORMIN (GLUCOPHAGE-XR) 500 MG 24 hr tablet 11/28/21 one full daily with full meal 30 tablet 0   Vitamin D, Ergocalciferol, (DRISDOL) 1.25 MG (50000 UNIT)  CAPS capsule Take once per week 4 capsule 0   No current facility-administered medications for this visit.    SURGICAL HISTORY:  Past Surgical History:  Procedure Laterality Date   CESAREAN SECTION  2008; 2014   CESAREAN SECTION N/A 07/27/2017   Procedure: REPEAT CESAREAN SECTION;  Surgeon: Reva Bores, MD;  Location: [facility] BIRTHING SUITES;  Service: Obstetrics;  Laterality: N/A;   FRACTURE SURGERY     ORIF ANKLE FRACTURE Right 10/28/2015   ORIF ANKLE FRACTURE Right 10/28/2015   Procedure: OPEN REDUCTION INTERNAL FIXATION (ORIF) RIGHT ANKLE FRACTURE;  Surgeon: Yolonda Kida, MD;  Location: [facility] OR;  Service: Orthopedics;  Laterality: Right;   TUBAL LIGATION Bilateral 07/27/2017   Procedure: BILATERAL TUBAL LIGATION;  Surgeon: Reva Bores, MD;  Location: [facility] BIRTHING SUITES;  Service: Obstetrics;  Laterality: Bilateral;    REVIEW OF SYSTEMS:   Review of Systems  Constitutional: Positive for fatigue. Negative for appetite change, chills, fever and unexpected weight change.  HENT: Negative for mouth sores, nosebleeds, sore throat and trouble swallowing.   Eyes: Negative for eye problems and icterus.  Respiratory: Negative for cough, hemoptysis, shortness of breath and wheezing.   Cardiovascular: Negative for chest pain and leg swelling.  Gastrointestinal: Negative for abdominal pain, constipation, diarrhea, nausea and vomiting.  Genitourinary: Negative for bladder incontinence, difficulty urinating, dysuria, frequency and hematuria.   Musculoskeletal: Negative for back pain, gait problem, neck pain and neck stiffness.  Skin: Negative for itching and rash.  Neurological: Negative for dizziness, extremity weakness, gait problem, headaches, light-headedness and seizures.  Hematological: Negative for adenopathy. Does not bruise/bleed easily.  Psychiatric/Behavioral: Negative for confusion, depression and sleep disturbance. The patient is not nervous/anxious.     PHYSICAL  EXAMINATION:  There were no vitals taken for this visit.  ECOG PERFORMANCE STATUS: 1  Physical Exam  Constitutional: Oriented to person, place, and time and well-developed, well-nourished, and in no distress.  HENT:  Head: Normocephalic and atraumatic.  Mouth/Throat: Oropharynx is clear and moist. No oropharyngeal exudate.  Eyes: Conjunctivae are normal. Right eye exhibits no discharge. Left eye exhibits no discharge. No scleral icterus.  Neck: Normal range of motion. Neck supple.  Cardiovascular: Normal rate, regular rhythm, normal heart sounds and intact distal pulses.   Pulmonary/Chest: Effort normal and breath sounds normal. No respiratory distress. No wheezes. No rales.  Abdominal: Soft. Bowel sounds are normal. Exhibits no distension and no mass. There is no tenderness.  Musculoskeletal: Normal range of motion. Exhibits no edema.  Lymphadenopathy:    No cervical adenopathy.  Neurological: Alert and oriented to person, place, and time. Exhibits normal muscle tone. Gait normal. Coordination normal.  Skin: Skin is warm and dry. No rash noted. Not diaphoretic. No erythema. No pallor.  Psychiatric: Mood, memory and judgment normal.  Vitals reviewed.  LABORATORY DATA: Lab Results  Component Value Date  WBC 8.3 08/18/2021   HGB 11.0 (L) 08/18/2021   HCT 35.1 (L) 08/18/2021   MCV 83.4 08/18/2021   PLT 420.0 (H) 08/18/2021      Chemistry      Component Value Date/Time   NA 139 03/06/2022 1308   K 4.6 03/06/2022 1308   CL 103 03/06/2022 1308   CO2 20 03/06/2022 1308   BUN 11 03/06/2022 1308   CREATININE 0.62 03/06/2022 1308   CREATININE 0.76 03/07/2021 1130      Component Value Date/Time   CALCIUM 9.1 03/06/2022 1308   ALKPHOS 115 03/06/2022 1308   AST 18 03/06/2022 1308   AST 11 (L) 03/07/2021 1130   ALT 23 03/06/2022 1308   ALT 12 03/07/2021 1130   BILITOT 0.2 03/06/2022 1308   BILITOT 0.3 03/07/2021 1130       RADIOGRAPHIC STUDIES:  DG Chest 2 View  Result  Date: 04/26/2022 CLINICAL DATA:  Bariatric patient. EXAM: CHEST - 2 VIEW COMPARISON:  Chest radiograph 08/18/2021. FINDINGS: The heart size and mediastinal contours are within normal limits. Both lungs are clear. The visualized skeletal structures are unremarkable. Probable external device projecting over the lower thorax. IMPRESSION: No active cardiopulmonary disease. Electronically Signed   By: Annia Belt M.D.   On: 04/26/2022 22:01   DG UGI W SINGLE CM (SOL OR THIN BA)  Result Date: 04/26/2022 CLINICAL DATA:  37 year old female with history of GERD, dm, and obesity who is being evaluated for possible bariatric surgery. Request for UGI study. EXAM: DG UGI W SINGLE CM TECHNIQUE: Scout radiograph was obtained. Single contrast examination was performed using thin liquid barium. This exam was performed by Lawernce Ion, PA-C, and was supervised and interpreted by Signa Kell, MD. FLUOROSCOPY: Radiation Exposure Index (as provided by the fluoroscopic device): 26.0 MGy Kerma COMPARISON:  None Available. FINDINGS: Scout Radiograph: Normal bowel gas pattern. Two small radiopaque objects in the pelvic area, patient has history of bilateral tubal ligation. Esophagus: Schatzki's ring. Otherwise normal barium esophagus with normal caliber and smooth contour. No evidence of fixed stricture, mass or mucosal abnormality. Esophageal motility:  Within normal limits. Gastroesophageal reflux: None visualized. Gastroesophageal could not be not reproduced rightward siphon, Valsalva maneuver, or cough. Ingested 13mm barium tablet: Patient was not able to swallow the barium tablet. Stomach: Normal appearance without gastric mass or mucosal abnormality. No hiatal hernia. Gastric emptying: Normal. Duodenum:  Normal appearance. Other:  None. IMPRESSION: Normal UGI study. Electronically Signed   By: Signa Kell M.D.   On: 04/26/2022 10:39     ASSESSMENT/PLAN:  This is a very pleasant 37 year old African-American female with iron  deficiency anemia secondary to menorrhagia.  She also has reactive thrombocytosis secondary to her iron deficiency.  The patient was seen by Dr. Arbutus Ped in 2023.  She received 1 dose IV iron but had an allergic reaction.  She did not receive any premedications.  She did not receive any IV iron since.  It was recommended that she follow-up in 3 months but she has not been seen since March 2023.  The patient had repeat lab studies today including CBC, iron studies, ferritin, and folic acid.  Her labs from today demonstrate anemia with Hbg of 10.0. Her other labs are pending.   We will arrange for IV iron with Feraheme with premedications with Tylenol and Benadryl.  She was instructed to have a driver as Benadryl may make her drowsy. This will hopefully improve her anemia quicker so she can be considered for surgery at her  next follow up with her surgeon in 3 months.   I also encouraged her to be compliant with her oral iron supplement 1 tablet p.o. daily with vitamin C.I will refill her folic acid if it is low, she is taking a bariatric multivitamin that may have folic acid in it. I have sent Integra plus to her pharmacy.   We will see her back for follow-up visit and repeat lab work in 3 months.  The patient was advised to call immediately if she has any concerning symptoms in the interval. The patient voices understanding of current disease status and treatment options and is in agreement with the current care plan. All questions were answered. The patient knows to call the clinic with any problems, questions or concerns. We can certainly see the patient much sooner if necessary      No orders of the defined types were placed in this encounter.     The total time spent in the appointment was 20-29 minutes.   Younis Mathey L Soliana Kitko, PA-C 05/23/22

## 2022-05-25 ENCOUNTER — Other Ambulatory Visit: Payer: Self-pay | Admitting: Physician Assistant

## 2022-05-25 ENCOUNTER — Inpatient Hospital Stay (HOSPITAL_BASED_OUTPATIENT_CLINIC_OR_DEPARTMENT_OTHER): Payer: BC Managed Care – PPO | Admitting: Physician Assistant

## 2022-05-25 ENCOUNTER — Inpatient Hospital Stay: Payer: BC Managed Care – PPO | Attending: Internal Medicine

## 2022-05-25 VITALS — BP 128/74 | HR 85 | Temp 98.5°F | Resp 18 | Ht 62.0 in | Wt 267.1 lb

## 2022-05-25 DIAGNOSIS — D75838 Other thrombocytosis: Secondary | ICD-10-CM | POA: Diagnosis not present

## 2022-05-25 DIAGNOSIS — E538 Deficiency of other specified B group vitamins: Secondary | ICD-10-CM | POA: Diagnosis not present

## 2022-05-25 DIAGNOSIS — N920 Excessive and frequent menstruation with regular cycle: Secondary | ICD-10-CM | POA: Insufficient documentation

## 2022-05-25 DIAGNOSIS — D500 Iron deficiency anemia secondary to blood loss (chronic): Secondary | ICD-10-CM | POA: Insufficient documentation

## 2022-05-25 DIAGNOSIS — D509 Iron deficiency anemia, unspecified: Secondary | ICD-10-CM | POA: Diagnosis not present

## 2022-05-25 DIAGNOSIS — Z86711 Personal history of pulmonary embolism: Secondary | ICD-10-CM | POA: Diagnosis not present

## 2022-05-25 LAB — CBC WITH DIFFERENTIAL (CANCER CENTER ONLY)
Abs Immature Granulocytes: 0.02 10*3/uL (ref 0.00–0.07)
Basophils Absolute: 0 10*3/uL (ref 0.0–0.1)
Basophils Relative: 0 %
Eosinophils Absolute: 0.5 10*3/uL (ref 0.0–0.5)
Eosinophils Relative: 7 %
HCT: 32.5 % — ABNORMAL LOW (ref 36.0–46.0)
Hemoglobin: 10 g/dL — ABNORMAL LOW (ref 12.0–15.0)
Immature Granulocytes: 0 %
Lymphocytes Relative: 40 %
Lymphs Abs: 2.9 10*3/uL (ref 0.7–4.0)
MCH: 25.3 pg — ABNORMAL LOW (ref 26.0–34.0)
MCHC: 30.8 g/dL (ref 30.0–36.0)
MCV: 82.3 fL (ref 80.0–100.0)
Monocytes Absolute: 0.6 10*3/uL (ref 0.1–1.0)
Monocytes Relative: 9 %
Neutro Abs: 3.2 10*3/uL (ref 1.7–7.7)
Neutrophils Relative %: 44 %
Platelet Count: 502 10*3/uL — ABNORMAL HIGH (ref 150–400)
RBC: 3.95 MIL/uL (ref 3.87–5.11)
RDW: 17.1 % — ABNORMAL HIGH (ref 11.5–15.5)
WBC Count: 7.2 10*3/uL (ref 4.0–10.5)
nRBC: 0 % (ref 0.0–0.2)

## 2022-05-25 LAB — IRON AND IRON BINDING CAPACITY (CC-WL,HP ONLY)
Iron: 11 ug/dL — ABNORMAL LOW (ref 28–170)
Saturation Ratios: 2 % — ABNORMAL LOW (ref 10.4–31.8)
TIBC: 475 ug/dL — ABNORMAL HIGH (ref 250–450)
UIBC: 464 ug/dL — ABNORMAL HIGH (ref 148–442)

## 2022-05-25 LAB — FOLATE: Folate: 5.3 ng/mL — ABNORMAL LOW (ref >5.9)

## 2022-05-25 MED ORDER — INTEGRA PLUS PO CAPS: 1.0000 | ORAL_CAPSULE | Freq: Every morning | ORAL | 2 refills | Status: DC

## 2022-05-26 LAB — FERRITIN: Ferritin: 8 ng/mL — ABNORMAL LOW (ref 11–307)

## 2022-05-27 ENCOUNTER — Other Ambulatory Visit: Payer: Self-pay | Admitting: Physician Assistant

## 2022-05-27 DIAGNOSIS — E538 Deficiency of other specified B group vitamins: Secondary | ICD-10-CM

## 2022-05-27 MED ORDER — FOLIC ACID 1 MG PO TABS
1.0000 mg | ORAL_TABLET | Freq: Every day | ORAL | 1 refills | Status: DC
Start: 2022-05-27 — End: 2023-01-23

## 2022-05-30 ENCOUNTER — Telehealth: Payer: Self-pay | Admitting: Internal Medicine

## 2022-05-30 NOTE — Telephone Encounter (Signed)
Scheduled per 05/24 los, patient has been called and notified.  

## 2022-06-02 ENCOUNTER — Ambulatory Visit: Payer: BC Managed Care – PPO

## 2022-06-04 ENCOUNTER — Other Ambulatory Visit: Payer: Self-pay | Admitting: Obstetrics

## 2022-06-04 DIAGNOSIS — B9689 Other specified bacterial agents as the cause of diseases classified elsewhere: Secondary | ICD-10-CM

## 2022-06-04 DIAGNOSIS — J301 Allergic rhinitis due to pollen: Secondary | ICD-10-CM

## 2022-06-06 ENCOUNTER — Encounter: Payer: Self-pay | Admitting: Physician Assistant

## 2022-06-09 ENCOUNTER — Ambulatory Visit: Payer: BC Managed Care – PPO

## 2022-06-09 ENCOUNTER — Inpatient Hospital Stay: Payer: BC Managed Care – PPO | Attending: Internal Medicine

## 2022-06-09 VITALS — BP 135/82 | HR 71 | Temp 98.0°F | Resp 17

## 2022-06-09 DIAGNOSIS — N920 Excessive and frequent menstruation with regular cycle: Secondary | ICD-10-CM | POA: Diagnosis not present

## 2022-06-09 DIAGNOSIS — D500 Iron deficiency anemia secondary to blood loss (chronic): Secondary | ICD-10-CM | POA: Insufficient documentation

## 2022-06-09 DIAGNOSIS — T7840XD Allergy, unspecified, subsequent encounter: Secondary | ICD-10-CM

## 2022-06-09 MED ORDER — SODIUM CHLORIDE 0.9 % IV SOLN
510.0000 mg | Freq: Once | INTRAVENOUS | Status: AC
  Administered 2022-06-09: 510 mg via INTRAVENOUS
  Filled 2022-06-09: qty 510

## 2022-06-09 MED ORDER — ACETAMINOPHEN 325 MG PO TABS
650.0000 mg | ORAL_TABLET | Freq: Once | ORAL | Status: AC
  Administered 2022-06-09: 650 mg via ORAL
  Filled 2022-06-09: qty 2

## 2022-06-09 MED ORDER — SODIUM CHLORIDE 0.9 % IV SOLN: Freq: Once | INTRAVENOUS | Status: AC

## 2022-06-09 MED ORDER — DIPHENHYDRAMINE HCL 25 MG PO CAPS
50.0000 mg | ORAL_CAPSULE | Freq: Once | ORAL | Status: AC
  Administered 2022-06-09: 50 mg via ORAL
  Filled 2022-06-09: qty 2

## 2022-06-09 NOTE — Patient Instructions (Signed)

## 2022-06-13 ENCOUNTER — Ambulatory Visit (INDEPENDENT_AMBULATORY_CARE_PROVIDER_SITE_OTHER): Payer: BC Managed Care – PPO | Admitting: Adult Health

## 2022-06-16 ENCOUNTER — Inpatient Hospital Stay: Payer: BC Managed Care – PPO

## 2022-06-16 ENCOUNTER — Other Ambulatory Visit: Payer: Self-pay

## 2022-06-16 VITALS — BP 127/90 | HR 72 | Temp 98.3°F | Resp 18

## 2022-06-16 DIAGNOSIS — T7840XD Allergy, unspecified, subsequent encounter: Secondary | ICD-10-CM

## 2022-06-16 DIAGNOSIS — N920 Excessive and frequent menstruation with regular cycle: Secondary | ICD-10-CM | POA: Diagnosis not present

## 2022-06-16 DIAGNOSIS — D500 Iron deficiency anemia secondary to blood loss (chronic): Secondary | ICD-10-CM | POA: Diagnosis not present

## 2022-06-16 MED ORDER — SODIUM CHLORIDE 0.9 % IV SOLN
510.0000 mg | Freq: Once | INTRAVENOUS | Status: AC
  Administered 2022-06-16: 510 mg via INTRAVENOUS
  Filled 2022-06-16: qty 510

## 2022-06-16 MED ORDER — DIPHENHYDRAMINE HCL 25 MG PO CAPS
50.0000 mg | ORAL_CAPSULE | Freq: Once | ORAL | Status: AC
  Administered 2022-06-16: 50 mg via ORAL
  Filled 2022-06-16: qty 2

## 2022-06-16 MED ORDER — SODIUM CHLORIDE 0.9 % IV SOLN: Freq: Once | INTRAVENOUS | Status: AC

## 2022-06-16 MED ORDER — ACETAMINOPHEN 325 MG PO TABS
650.0000 mg | ORAL_TABLET | Freq: Once | ORAL | Status: AC
  Administered 2022-06-16: 650 mg via ORAL
  Filled 2022-06-16: qty 2

## 2022-06-16 NOTE — Progress Notes (Signed)
Pt tolerated tx well without incident. Pt observed for post-venofer infusion. VSS at d/c. Pt ambulated to lobby.

## 2022-07-03 ENCOUNTER — Telehealth: Payer: Medicaid Other | Admitting: Physician Assistant

## 2022-07-03 DIAGNOSIS — J4541 Moderate persistent asthma with (acute) exacerbation: Secondary | ICD-10-CM

## 2022-07-03 MED ORDER — PREDNISONE 20 MG PO TABS
40.0000 mg | ORAL_TABLET | Freq: Every day | ORAL | 0 refills | Status: DC
Start: 2022-07-03 — End: 2022-07-13

## 2022-07-03 NOTE — Progress Notes (Signed)
E-Visit for Asthma  Based on what you have shared with me, it looks like you may have a flare up of your asthma.  Asthma is a chronic (ongoing) lung disease which results in airway obstruction, inflammation and hyper-responsiveness.   Asthma symptoms vary from person to person, with common symptoms including nighttime awakening and decreased ability to participate in normal activities as a result of shortness of breath. It is often triggered by changes in weather, changes in the season, changes in air temperature, or inside (home, school, daycare or work) allergens such as animal dander, mold, mildew, woodstoves or cockroaches.   It can also be triggered by hormonal changes, extreme emotion, physical exertion or an upper respiratory tract illness.     It is important to identify the trigger, and then eliminate or avoid the trigger if possible.   If you have been prescribed medications to be taken on a regular basis, it is important to follow the asthma action plan and to follow guidelines to adjust medication in response to increasing symptoms of decreased peak expiratory flow rate  Treatment: I have prescribed: Prednisone 40mg by mouth per day for 5 - 7 days  HOME CARE Only take medications as instructed by your medical team. Consider wearing a mask or scarf to improve breathing air temperature have been shown to decrease irritation and decrease exacerbations Get rest. Taking a steamy shower or using a humidifier may help nasal congestion sand ease sore throat pain. You can place a towel over your head and breathe in the steam from hot water coming from a faucet. Using a saline nasal spray works much the same way.  Cough drops, hare candies and sore throat lozenges may ease your cough.  Avoid close contacts especially the very you and the elderly Cover your mouth if you cough or  sneeze Always remember to wash your hands.    GET HELP RIGHT AWAY IF: You develop worsening symptoms; breathlessness at rest, drowsy, confused or agitated, unable to speak in full sentences You have coughing fits You develop a severe headache or visual changes You develop shortness of breath, difficulty breathing or start having chest pain Your symptoms persist after you have completed your treatment plan If your symptoms do not improve within 10 days  MAKE SURE YOU Understand these instructions. Will watch your condition. Will get help right away if you are not doing well or get worse.   Your e-visit answers were reviewed by a board certified advanced clinical practitioner to complete your personal care plan, Depending upon the condition, your plan could have included both over the counter or prescription medications.   Please review your pharmacy choice. Your safety is important to us. If you have drug allergies check your prescription carefully.  You can use MyChart to ask questions about today's visit, request a non-urgent  call back, or ask for a work or school excuse for 24 hours related to this e-Visit. If it has been greater than 24 hours you will need to follow up with your provider, or enter a new e-Visit to address those concerns.   You will get an e-mail in the next two days asking about your experience. I hope that your e-visit has been valuable and will speed your recovery. Thank you for using e-visits.  I have spent 5 minutes in review of e-visit questionnaire, review and updating patient chart, medical decision making and response to patient.   Mertice Uffelman M Kyndel Egger, PA-C  

## 2022-07-13 ENCOUNTER — Telehealth: Payer: Medicaid Other | Admitting: Family Medicine

## 2022-07-13 ENCOUNTER — Encounter: Payer: Self-pay | Admitting: Physician Assistant

## 2022-07-13 DIAGNOSIS — J4541 Moderate persistent asthma with (acute) exacerbation: Secondary | ICD-10-CM | POA: Diagnosis not present

## 2022-07-13 MED ORDER — MONTELUKAST SODIUM 10 MG PO TABS
10.0000 mg | ORAL_TABLET | Freq: Every day | ORAL | 1 refills | Status: DC
Start: 2022-07-13 — End: 2023-01-23

## 2022-07-13 MED ORDER — ALBUTEROL SULFATE HFA 108 (90 BASE) MCG/ACT IN AERS
1.0000 | INHALATION_SPRAY | RESPIRATORY_TRACT | 1 refills | Status: DC | PRN
Start: 2022-07-13 — End: 2023-01-23

## 2022-07-13 MED ORDER — FLUTICASONE PROPIONATE HFA 110 MCG/ACT IN AERO
2.0000 | INHALATION_SPRAY | Freq: Two times a day (BID) | RESPIRATORY_TRACT | 1 refills | Status: DC
Start: 2022-07-13 — End: 2023-01-30

## 2022-07-13 NOTE — Progress Notes (Signed)
Virtual Visit Consent   Debra Horn, you are scheduled for a virtual visit with a [facility] provider today. Just as with appointments in the office, your consent must be obtained to participate. Your consent will be active for this visit and any virtual visit you may have with one of our providers in the next 365 days. If you have a MyChart account, a copy of this consent can be sent to you electronically.  As this is a virtual visit, video technology does not allow for your provider to perform a traditional examination. This may limit your provider's ability to fully assess your condition. If your provider identifies any concerns that need to be evaluated in person or the need to arrange testing (such as labs, EKG, etc.), we will make arrangements to do so. Although advances in technology are sophisticated, we cannot ensure that it will always work on either your end or our end. If the connection with a video visit is poor, the visit may have to be switched to a telephone visit. With either a video or telephone visit, we are not always able to ensure that we have a secure connection.  By engaging in this virtual visit, you consent to the provision of healthcare and authorize for your insurance to be billed (if applicable) for the services provided during this visit. Depending on your insurance coverage, you may receive a charge related to this service.  I need to obtain your verbal consent now. Are you willing to proceed with your visit today? Debra Horn has provided verbal consent on 07/13/2022 for a virtual visit (video or telephone). Freddy Finner, NP  Date: 07/13/2022 1:02 PM  Virtual Visit via Video Note   I, Freddy Finner, connected with  Debra Horn  ([id number], 09/05/1985) on 07/13/22 at  1:00 PM EDT by a video-enabled telemedicine application and verified that I am speaking with the correct person using two identifiers.  Location: Patient: Virtual Visit Location Patient:  Home Provider: Virtual Visit Location Provider: Home Office   I discussed the limitations of evaluation and management by telemedicine and the availability of in person appointments. The patient expressed understanding and agreed to proceed.    History of Present Illness: Debra Horn is a 37 y.o. who identifies as a female who was assigned female at birth, and is being seen today for on going asthma issues  Did an EV last week and was given prednisone and that helped a lot- Last night reports she had to leave her bedroom due to her chest being so tight, but unable to have inhaler relief.  She reports bathroom flooded last month but they used fans to dry it out. Unsure if mold is present in or under flooring or walls.  Needs refills on inhalers. Reports using them and claritin as directed    Problems:  Patient Active Problem List   Diagnosis Date Noted   Iron deficiency anemia 05/25/2022   Shortness of breath 08/22/2021   Moderate persistent asthma with exacerbation 07/28/2021   Diffuse pain 05/31/2021   Allergic reaction 05/11/2021   Iron deficiency anemia due to chronic blood loss 03/07/2021   Low folate 03/07/2021   Class 3 severe obesity with serious comorbidity and body mass index (BMI) of 40.0 to 44.9 in adult (HCC) 03/03/2021   CRP elevated 12/01/2020   Elevated sed rate 12/01/2020   Nasal drainage 11/09/2020   Insulin resistance 10/26/2020   Other chest pain 10/13/2020   Severe sleep  apnea 06/24/2020   BMI 45.0-49.9, adult (HCC) 06/24/2020   Status post repeat low transverse cesarean section 07/27/2017   Poor fetal growth affecting management of mother in third trimester    Previous pregnancy complicated by pregnancy-induced hypertension in third trimester, antepartum    IUGR (intrauterine growth restriction) in prior pregnancy, pregnant 03/06/2017   Vitamin D deficiency 02/17/2017   History of preterm delivery 02/09/2017   History of asthma    Normocytic anemia  02/06/2016   Family history of systemic lupus erythematosus (SLE) in mother 02/19/2014   Hyperthyroidism 02/19/2014   Previous cesarean section 08/08/2012   History of pulmonary embolus (PE) 07/24/2012    Allergies:  Allergies  Allergen Reactions   Banana Anaphylaxis   Cefixime Rash   Medications:  Current Outpatient Medications:    albuterol (VENTOLIN HFA) 108 (90 Base) MCG/ACT inhaler, TAKE 2 PUFFS BY MOUTH EVERY 6 HOURS AS NEEDED FOR WHEEZE OR SHORTNESS OF BREATH, Disp: 18 each, Rfl: 3   albuterol (VENTOLIN HFA) 108 (90 Base) MCG/ACT inhaler, Inhale 2 puffs into the lungs every 6 (six) hours as needed for wheezing or shortness of breath., Disp: 8 g, Rfl: 2   FeFum-FePoly-FA-B Cmp-C-Biot (INTEGRA PLUS) CAPS, Take 1 capsule by mouth every morning., Disp: 30 capsule, Rfl: 2   FLUoxetine (PROZAC) 10 MG capsule, Take 1 capsule (10 mg total) by mouth daily., Disp: 90 capsule, Rfl: 0   fluticasone (FLONASE) 50 MCG/ACT nasal spray, SPRAY 2 SPRAYS INTO EACH NOSTRIL EVERY DAY (Patient taking differently: Place 2 sprays into both nostrils daily.), Disp: 16 mL, Rfl: 0   fluticasone (FLOVENT HFA) 110 MCG/ACT inhaler, Inhale 2 puffs into the lungs in the morning and at bedtime., Disp: 1 each, Rfl: 11   folic acid (FOLVITE) 1 MG tablet, Take 1 tablet (1 mg total) by mouth daily., Disp: 90 tablet, Rfl: 1   loratadine (CLARITIN) 10 MG tablet, TAKE 1 TABLET BY MOUTH EVERY DAY, Disp: 30 tablet, Rfl: 11   metFORMIN (GLUCOPHAGE-XR) 500 MG 24 hr tablet, 11/28/21 one full daily with full meal, Disp: 30 tablet, Rfl: 0   metroNIDAZOLE (METROGEL) 0.75 % vaginal gel, PLACE 1 APPLICATORFUL VAGINALLY 2 (TWO) TIMES DAILY. USE MONTHLY AFTER THE PERIOD FOR 6 MONTHS, Disp: 70 g, Rfl: 5   predniSONE (DELTASONE) 20 MG tablet, Take 2 tablets (40 mg total) by mouth daily with breakfast., Disp: 14 tablet, Rfl: 0   Vitamin D, Ergocalciferol, (DRISDOL) 1.25 MG (50000 UNIT) CAPS capsule, Take once per week, Disp: 4 capsule, Rfl:  0  Observations/Objective: Patient is well-developed, well-nourished in no acute distress.  Resting comfortably  at home.  Head is normocephalic, atraumatic.  No labored breathing.  Speech is clear and coherent with logical content.  Patient is alert and oriented at baseline.    Assessment and Plan:   1. Moderate persistent asthma with acute exacerbation  - montelukast (SINGULAIR) 10 MG tablet; Take 1 tablet (10 mg total) by mouth at bedtime.  Dispense: 30 tablet; Refill: 1 - albuterol (VENTOLIN HFA) 108 (90 Base) MCG/ACT inhaler; Inhale 1-2 puffs into the lungs every 4 (four) hours as needed for wheezing or shortness of breath.  Dispense: 18 g; Refill: 1 - fluticasone (FLOVENT HFA) 110 MCG/ACT inhaler; Inhale 2 puffs into the lungs in the morning and at bedtime.  Dispense: 1 each; Refill: 1   -refrain from more oral steroids given recent use  -use singular for better allergy asthma control -encouraged to do a mold check on home -follow up in person  if not improving  Reviewed side effects, risks and benefits of medication.    Patient acknowledged agreement and understanding of the plan.   Past Medical, Surgical, Social History, Allergies, and Medications have been Reviewed.    Follow Up Instructions: I discussed the assessment and treatment plan with the patient. The patient was provided an opportunity to ask questions and all were answered. The patient agreed with the plan and demonstrated an understanding of the instructions.  A copy of instructions were sent to the patient via MyChart unless otherwise noted below.    The patient was advised to call back or seek an in-person evaluation if the symptoms worsen or if the condition fails to improve as anticipated.  Time:  I spent 10 minutes with the patient via telehealth technology discussing the above problems/concerns.    Freddy Finner, NP

## 2022-07-13 NOTE — Patient Instructions (Signed)
[name], thank you for joining Freddy Finner, NP for today's virtual visit.  While this provider is not your primary care provider (PCP), if your PCP is located in our provider database this encounter information will be shared with them immediately following your visit.   A [facility] MyChart account gives you access to today's visit and all your visits, tests, and labs performed at [facility] " click here if you don't have a [facility] MyChart account or go to [email or web address]  Consent: (Patient) Debra Horn provided verbal consent for this virtual visit at the beginning of the encounter.  Current Medications:  Current Outpatient Medications:    montelukast (SINGULAIR) 10 MG tablet, Take 1 tablet (10 mg total) by mouth at bedtime., Disp: 30 tablet, Rfl: 1   albuterol (VENTOLIN HFA) 108 (90 Base) MCG/ACT inhaler, Inhale 2 puffs into the lungs every 6 (six) hours as needed for wheezing or shortness of breath., Disp: 8 g, Rfl: 2   albuterol (VENTOLIN HFA) 108 (90 Base) MCG/ACT inhaler, Inhale 1-2 puffs into the lungs every 4 (four) hours as needed for wheezing or shortness of breath., Disp: 18 g, Rfl: 1   FeFum-FePoly-FA-B Cmp-C-Biot (INTEGRA PLUS) CAPS, Take 1 capsule by mouth every morning., Disp: 30 capsule, Rfl: 2   FLUoxetine (PROZAC) 10 MG capsule, Take 1 capsule (10 mg total) by mouth daily., Disp: 90 capsule, Rfl: 0   fluticasone (FLONASE) 50 MCG/ACT nasal spray, SPRAY 2 SPRAYS INTO EACH NOSTRIL EVERY DAY (Patient taking differently: Place 2 sprays into both nostrils daily.), Disp: 16 mL, Rfl: 0   fluticasone (FLOVENT HFA) 110 MCG/ACT inhaler, Inhale 2 puffs into the lungs in the morning and at bedtime., Disp: 1 each, Rfl: 1   folic acid (FOLVITE) 1 MG tablet, Take 1 tablet (1 mg total) by mouth daily., Disp: 90 tablet, Rfl: 1   loratadine (CLARITIN) 10 MG tablet, TAKE 1 TABLET BY MOUTH EVERY DAY, Disp: 30 tablet, Rfl: 11   metFORMIN (GLUCOPHAGE-XR)  500 MG 24 hr tablet, 11/28/21 one full daily with full meal, Disp: 30 tablet, Rfl: 0   metroNIDAZOLE (METROGEL) 0.75 % vaginal gel, PLACE 1 APPLICATORFUL VAGINALLY 2 (TWO) TIMES DAILY. USE MONTHLY AFTER THE PERIOD FOR 6 MONTHS, Disp: 70 g, Rfl: 5   Vitamin D, Ergocalciferol, (DRISDOL) 1.25 MG (50000 UNIT) CAPS capsule, Take once per week, Disp: 4 capsule, Rfl: 0   Medications ordered in this encounter:  Meds ordered this encounter  Medications   montelukast (SINGULAIR) 10 MG tablet    Sig: Take 1 tablet (10 mg total) by mouth at bedtime.    Dispense:  30 tablet    Refill:  1    Order Specific Question:   Supervising Provider    Answer:   Loreli Dollar   albuterol (VENTOLIN HFA) 108 (90 Base) MCG/ACT inhaler    Sig: Inhale 1-2 puffs into the lungs every 4 (four) hours as needed for wheezing or shortness of breath.    Dispense:  18 g    Refill:  1    Order Specific Question:   Supervising Provider    Answer:   Merrilee Jansky [[id number]]   fluticasone (FLOVENT HFA) 110 MCG/ACT inhaler    Sig: Inhale 2 puffs into the lungs in the morning and at bedtime.    Dispense:  1 each    Refill:  1    Order Specific Question:   Supervising Provider    Answer:   Merrilee Jansky [  [id number]]     *If you need refills on other medications prior to your next appointment, please contact your pharmacy*  Follow-Up: Call back or seek an in-person evaluation if the symptoms worsen or if the condition fails to improve as anticipated.  [facility] [phone]  Other Instructions    If you have been instructed to have an in-person evaluation today at a local Urgent Care facility, please use the link below. It will take you to a list of all of our available [facility], including address, phone number and hours of operation. Please do not delay care.  [facility]  If you or a family member do not have a primary care provider, use the link below to  schedule a visit and establish care. When you choose a [facility] primary care physician or advanced practice provider, you gain a long-term partner in health. Find a Primary Care Provider  Learn more about [facility]'s in-office and virtual care options: [facility] - Get Care Now

## 2022-07-18 ENCOUNTER — Encounter: Payer: Self-pay | Admitting: Physician Assistant

## 2022-07-29 DIAGNOSIS — G4733 Obstructive sleep apnea (adult) (pediatric): Secondary | ICD-10-CM | POA: Diagnosis not present

## 2022-08-01 ENCOUNTER — Encounter: Payer: Self-pay | Admitting: Physician Assistant

## 2022-08-07 ENCOUNTER — Encounter: Payer: Self-pay | Admitting: Physician Assistant

## 2022-08-10 ENCOUNTER — Ambulatory Visit (INDEPENDENT_AMBULATORY_CARE_PROVIDER_SITE_OTHER): Payer: Medicaid Other | Admitting: Adult Health

## 2022-08-21 NOTE — Progress Notes (Deleted)
[facility] OFFICE PROGRESS NOTE  Avanell Shackleton, NP-C [address] [place] [address]  DIAGNOSIS: Persistent iron deficiency anemia secondary to menorrhagia. The patient also has thrombocytosis that is reactive in nature secondary to the iron deficiency. She is unable to correct her iron deficiency with the oral supplements.   PRIOR THERAPY: None  CURRENT THERAPY: 1) IV iron PRN with venofer 300 mg weekly, most recent dose on 04/14/22. She only received one dose due to infusion reaction. Will switch to feraheme moving forward with premedications with benadryl and tylenol.  Most recent dose on 06/16/2022 2) Folic acid 1 mg p.o. daily 3) Oral iron supplements, will restart today ***on 05/25/22  INTERVAL HISTORY: Debra Horn 37 y.o. female returns  to the clinic today for a follow-up visit.  The patient establish care with a clinic on 03/07/2021.  She had been lost to follow-up but reestablished on 05/25/2022.  The patient is followed for iron deficiency anemia secondary to menorrhagia.  She also was found to have folic acid deficiency and she is currently taking folic acid 1 mg p.o. daily.  She resumed this ***at her last appointment confirm?  Presumed iron?  She previously had a reaction to IV iron and moving forward has been changed to Feraheme.  Her reaction included fever and hallucinations.  She also did not have any premedications at this time.  Since last being seen she underwent IV iron with Feraheme the most recent being on 06/16/2022 to tolerate this well without any concerning adverse side effects  The patient is also being considered for bariatric surgery but her surgeon would like her to correct her anemia and they are scheduled to see her *On **Last being seen the patient *is feeling**.  Low energy?  Denies any dizziness.  Reports dyspnea on exertion.  Heavy menstrual cycles?  She is not able to take birth control due to her history of pulmonary embolisms on  birth control.  Denies any abnormal bleeding or bruising.  She is here today for evaluation repeat blood work   MEDICAL HISTORY: Past Medical History:  Diagnosis Date   Anemia    low iron   Asthma    Complication of anesthesia    bp dropped in middle of last c-section and started with n/v   GERD (gastroesophageal reflux disease)    during pregnancy   Headache    "couple days/week" (10/28/2015)   Hyperthyroidism    not on medication   PE (pulmonary embolism) 11/2011   PONV (postoperative nausea and vomiting)    Pregnancy induced hypertension    Preterm labor    Sleep apnea    SOB (shortness of breath)    Thyroid condition    Vitamin D deficiency     ALLERGIES:  is allergic to banana and cefixime.  MEDICATIONS:  Current Outpatient Medications  Medication Sig Dispense Refill   albuterol (VENTOLIN HFA) 108 (90 Base) MCG/ACT inhaler Inhale 2 puffs into the lungs every 6 (six) hours as needed for wheezing or shortness of breath. 8 g 2   albuterol (VENTOLIN HFA) 108 (90 Base) MCG/ACT inhaler Inhale 1-2 puffs into the lungs every 4 (four) hours as needed for wheezing or shortness of breath. 18 g 1   FeFum-FePoly-FA-B Cmp-C-Biot (INTEGRA PLUS) CAPS Take 1 capsule by mouth every morning. 30 capsule 2   FLUoxetine (PROZAC) 10 MG capsule Take 1 capsule (10 mg total) by mouth daily. 90 capsule 0   fluticasone (FLONASE) 50 MCG/ACT nasal  spray SPRAY 2 SPRAYS INTO EACH NOSTRIL EVERY DAY (Patient taking differently: Place 2 sprays into both nostrils daily.) 16 mL 0   fluticasone (FLOVENT HFA) 110 MCG/ACT inhaler Inhale 2 puffs into the lungs in the morning and at bedtime. 1 each 1   folic acid (FOLVITE) 1 MG tablet Take 1 tablet (1 mg total) by mouth daily. 90 tablet 1   loratadine (CLARITIN) 10 MG tablet TAKE 1 TABLET BY MOUTH EVERY DAY 30 tablet 11   metFORMIN (GLUCOPHAGE-XR) 500 MG 24 hr tablet 11/28/21 one full daily with full meal 30 tablet 0   metroNIDAZOLE (METROGEL) 0.75 % vaginal gel  PLACE 1 APPLICATORFUL VAGINALLY 2 (TWO) TIMES DAILY. USE MONTHLY AFTER THE PERIOD FOR 6 MONTHS 70 g 5   montelukast (SINGULAIR) 10 MG tablet Take 1 tablet (10 mg total) by mouth at bedtime. 30 tablet 1   Vitamin D, Ergocalciferol, (DRISDOL) 1.25 MG (50000 UNIT) CAPS capsule Take once per week 4 capsule 0   No current facility-administered medications for this visit.    SURGICAL HISTORY:  Past Surgical History:  Procedure Laterality Date   CESAREAN SECTION  2008; 2014   CESAREAN SECTION N/A 07/27/2017   Procedure: REPEAT CESAREAN SECTION;  Surgeon: Reva Bores, MD;  Location: [facility] BIRTHING SU[facility];  Service: Obstetrics;  Laterality: N/A;   FRACTURE SURGERY     ORIF ANKLE FRACTURE Right 10/28/2015   ORIF ANKLE FRACTURE Right 10/28/2015   Procedure: OPEN REDUCTION INTERNAL FIXATION (ORIF) RIGHT ANKLE FRACTURE;  Surgeon: Yolonda Kida, MD;  Location: [facility] OR;  Service: Orthopedics;  Laterality: Right;   TUBAL LIGATION Bilateral 07/27/2017   Procedure: BILATERAL TUBAL LIGATION;  Surgeon: Reva Bores, MD;  Location: [facility] BIRTHING SUITES;  Service: Obstetrics;  Laterality: Bilateral;    REVIEW OF SYSTEMS:   Review of Systems  Constitutional: Negative for appetite change, chills, fatigue, fever and unexpected weight change.  HENT:   Negative for mouth sores, nosebleeds, sore throat and trouble swallowing.   Eyes: Negative for eye problems and icterus.  Respiratory: Negative for cough, hemoptysis, shortness of breath and wheezing.   Cardiovascular: Negative for chest pain and leg swelling.  Gastrointestinal: Negative for abdominal pain, constipation, diarrhea, nausea and vomiting.  Genitourinary: Negative for bladder incontinence, difficulty urinating, dysuria, frequency and hematuria.   Musculoskeletal: Negative for back pain, gait problem, neck pain and neck stiffness.  Skin: Negative for itching and rash.  Neurological: Negative for dizziness, extremity weakness, gait problem,  headaches, light-headedness and seizures.  Hematological: Negative for adenopathy. Does not bruise/bleed easily.  Psychiatric/Behavioral: Negative for confusion, depression and sleep disturbance. The patient is not nervous/anxious.     PHYSICAL EXAMINATION:  There were no vitals taken for this visit.  ECOG PERFORMANCE STATUS: {CHL ONC ECOG [id number]  Physical Exam  Constitutional: Oriented to person, place, and time and well-developed, well-nourished, and in no distress. No distress.  HENT:  Head: Normocephalic and atraumatic.  Mouth/Throat: Oropharynx is clear and moist. No oropharyngeal exudate.  Eyes: Conjunctivae are normal. Right eye exhibits no discharge. Left eye exhibits no discharge. No scleral icterus.  Neck: Normal range of motion. Neck supple.  Cardiovascular: Normal rate, regular rhythm, normal heart sounds and intact distal pulses.   Pulmonary/Chest: Effort normal and breath sounds normal. No respiratory distress. No wheezes. No rales.  Abdominal: Soft. Bowel sounds are normal. Exhibits no distension and no mass. There is no tenderness.  Musculoskeletal: Normal range of motion. Exhibits no edema.  Lymphadenopathy:    No cervical adenopathy.  Neurological: Alert and oriented to person, place, and time. Exhibits normal muscle tone. Gait normal. Coordination normal.  Skin: Skin is warm and dry. No rash noted. Not diaphoretic. No erythema. No pallor.  Psychiatric: Mood, memory and judgment normal.  Vitals reviewed.  LABORATORY DATA: Lab Results  Component Value Date   WBC 7.2 05/25/2022   HGB 10.0 (L) 05/25/2022   HCT 32.5 (L) 05/25/2022   MCV 82.3 05/25/2022   PLT 502 (H) 05/25/2022      Chemistry      Component Value Date/Time   NA 139 03/06/2022 1308   K 4.6 03/06/2022 1308   CL 103 03/06/2022 1308   CO2 20 03/06/2022 1308   BUN 11 03/06/2022 1308   CREATININE 0.62 03/06/2022 1308   CREATININE 0.76 03/07/2021 1130      Component Value Date/Time    CALCIUM 9.1 03/06/2022 1308   ALKPHOS 115 03/06/2022 1308   AST 18 03/06/2022 1308   AST 11 (L) 03/07/2021 1130   ALT 23 03/06/2022 1308   ALT 12 03/07/2021 1130   BILITOT 0.2 03/06/2022 1308   BILITOT 0.3 03/07/2021 1130       RADIOGRAPHIC STUDIES:  No results found.   ASSESSMENT/PLAN:  This is a very pleasant 37 year old African-American female with iron deficiency anemia secondary to menorrhagia.  She also has reactive thrombocytosis secondary to her iron deficiency.   The patient was seen by Dr. Arbutus Ped in 2023.  She received 1 dose IV iron but had an allergic reaction.  She did not receive any premedications.  It was recommended that she follow-up in 3 months but she has not been seen since March 2023.  She eventually reestablish care in May 2024.  She underwent IV iron with Feraheme most recent being on 06/16/2022 and she tolerated this well without any concerning adverse.  She was premedicated with Tylenol and Benadryl.  When his surgery?***  The patient had repeat iron studies and CBC drawn today.  Her labs **  *I will arrange for** Back for follow-up visit in***  Folic acid and iron supplement?  Integra plus question  The patient was advised to call immediately if she has any concerning symptoms in the interval. The patient voices understanding of current disease status and treatment options and is in agreement with the current care plan. All questions were answered. The patient knows to call the clinic with any problems, questions or concerns. We can certainly see the patient much sooner if necessary    No orders of the defined types were placed in this encounter.    I spent {CHL ONC TIME VISIT - ZOXWR:[id number]} counseling the patient face to face. The total time spent in the appointment was {CHL ONC TIME VISIT - BJYNW:[id number]}.  Namish Krise L Arvie Bartholomew, PA-C 08/21/22

## 2022-08-23 ENCOUNTER — Encounter: Payer: Self-pay | Admitting: Gastroenterology

## 2022-08-24 ENCOUNTER — Inpatient Hospital Stay: Payer: Medicaid Other

## 2022-08-24 ENCOUNTER — Ambulatory Visit: Payer: BC Managed Care – PPO | Admitting: Physician Assistant

## 2022-08-24 ENCOUNTER — Other Ambulatory Visit: Payer: BC Managed Care – PPO

## 2022-08-24 ENCOUNTER — Telehealth: Payer: Self-pay | Admitting: Physician Assistant

## 2022-08-24 ENCOUNTER — Inpatient Hospital Stay: Payer: Medicaid Other | Admitting: Physician Assistant

## 2022-08-28 ENCOUNTER — Other Ambulatory Visit (HOSPITAL_COMMUNITY): Payer: Self-pay

## 2022-08-29 ENCOUNTER — Ambulatory Visit (INDEPENDENT_AMBULATORY_CARE_PROVIDER_SITE_OTHER): Payer: 59 | Admitting: Adult Health

## 2022-08-29 ENCOUNTER — Encounter: Payer: Self-pay | Admitting: Physician Assistant

## 2022-08-29 ENCOUNTER — Encounter (INDEPENDENT_AMBULATORY_CARE_PROVIDER_SITE_OTHER): Payer: Self-pay | Admitting: Adult Health

## 2022-08-29 ENCOUNTER — Telehealth (INDEPENDENT_AMBULATORY_CARE_PROVIDER_SITE_OTHER): Payer: Self-pay

## 2022-08-29 ENCOUNTER — Other Ambulatory Visit (HOSPITAL_COMMUNITY): Payer: Self-pay

## 2022-08-29 VITALS — BP 138/87 | HR 65 | Temp 98.1°F | Ht 62.0 in | Wt 258.0 lb

## 2022-08-29 DIAGNOSIS — F419 Anxiety disorder, unspecified: Secondary | ICD-10-CM | POA: Diagnosis not present

## 2022-08-29 DIAGNOSIS — F32A Depression, unspecified: Secondary | ICD-10-CM | POA: Diagnosis not present

## 2022-08-29 DIAGNOSIS — E88819 Insulin resistance, unspecified: Secondary | ICD-10-CM

## 2022-08-29 DIAGNOSIS — E559 Vitamin D deficiency, unspecified: Secondary | ICD-10-CM | POA: Diagnosis not present

## 2022-08-29 DIAGNOSIS — E669 Obesity, unspecified: Secondary | ICD-10-CM | POA: Diagnosis not present

## 2022-08-29 DIAGNOSIS — Z6841 Body Mass Index (BMI) 40.0 and over, adult: Secondary | ICD-10-CM | POA: Diagnosis not present

## 2022-08-29 MED ORDER — FLUOXETINE HCL 10 MG PO CAPS
10.0000 mg | ORAL_CAPSULE | Freq: Every day | ORAL | 0 refills | Status: DC
  Filled 2022-08-29: qty 90, 90d supply, fill #0

## 2022-08-29 MED ORDER — WEGOVY 0.25 MG/0.5ML ~~LOC~~ SOAJ
0.2500 mg | SUBCUTANEOUS | 0 refills | Status: DC
  Filled 2022-08-29: qty 2, 28d supply, fill #0

## 2022-08-29 NOTE — Telephone Encounter (Signed)
Request for PA for [facility], which is not covered by [facility] through [employer], but I did submit it anyway with a return of PA not available. 11:46 08/29/22 KP

## 2022-08-29 NOTE — Telephone Encounter (Deleted)
PA submitted for [facility], waiting on a determination. 11:22 08/29/22 KP

## 2022-08-29 NOTE — Progress Notes (Addendum)
WEIGHT SUMMARY AND BIOMETRICS  Vitals Temp: 98.1 F (36.7 C) BP: 138/87 Pulse Rate: 65 SpO2: 100 %   Anthropometric Measurements Height: 5\' 2"  (1.575 m) Weight: 258 lb (117 kg) BMI (Calculated): 47.18 Weight at Last Visit: 267lb Weight Lost Since Last Visit: 9lb Weight Gained Since Last Visit: 0 Starting Weight: 244lb Total Weight Loss (lbs): 14 lb (6.35 kg)   Body Composition  Body Fat %: 51.8 % Fat Mass (lbs): 133.8 lbs Muscle Mass (lbs): 118.2 lbs Total Body Water (lbs): 85.4 lbs Visceral Fat Rating : 16   Other Clinical Data Fasting: yes Labs: no Today's Visit #: 24 Starting Date: 09/15/20    Chief Complaint:   OBESITY Debra Horn is here to discuss her progress with her obesity treatment plan. She is on the the Category 2 Plan and states she is following her eating plan approximately 25 % of the time. She states she is exercising Walking 30 minutes 3 times per week.   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 Insulin resistance She has been off Metformin XR 500mg  every day for > 2 months She denies family hx of MEN 2 or MTC She denies personal hx of pancreatitis or cholecystitis  She rarely consumes ETOH   Of Note- Tubal Ligation 07/27/2017  2. Vitamin D deficiency She has been off Ergocalciferol for > 2 months  3. 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 Insulin  resistance Check Labs - Comprehensive metabolic panel - Hemoglobin A1c - Insulin, random - Vitamin B12  2. Vitamin D deficiency Check Labs - VITAMIN D 25 Hydroxy (Vit-D Deficiency, Fractures) Send MyChart message after labs and if appropriate, re-start Ergocalciferol  3. Anxiety and depression Re-start  FLUoxetine (PROZAC) 10 MG capsule Take 1 capsule (10 mg total) by mouth daily. Dispense: 90 capsule, Refills: 0 of 0 remaining    4. Obesity, current BMI 47.18 Start   Semaglutide-Weight Management (WEGOVY) 0.25 MG/0.5ML SOAJ Inject 0.25 mg into the skin once a week. Dispense: 2 mL, Refills: 0 of 0 remaining   Debra Horn is not currently in the action stage of change. As such, her goal is to get back to weightloss efforts . She has agreed to the Category 2 Plan.   Exercise goals: All adults should avoid inactivity. Some physical activity is better than none, and adults who participate in any amount of physical activity gain some health benefits. Adults should also include muscle-strengthening activities that involve all major muscle groups on 2 or more days a week.  Behavioral modification strategies: increasing lean protein intake, decreasing simple carbohydrates, increasing vegetables, increasing water intake, decreasing eating out, no skipping meals, meal planning and cooking strategies, better snacking choices, and planning for success.  Debra Horn has agreed to follow-up with our clinic in 3 weeks. She was informed of the importance of frequent follow-up visits to maximize her success with intensive lifestyle modifications for her multiple health conditions.   Debra Horn was  informed we would discuss her lab results at her next visit unless there is a critical issue that needs to be addressed sooner. Debra Horn agreed to keep her next visit at the agreed upon time to discuss these results.  Objective:   Blood pressure 138/87, pulse 65, temperature 98.1 F (36.7 C), height 5\' 2"  (1.575 m),  weight 258 lb (117 kg), SpO2 100%. Body mass index is 47.19 kg/m.  General: Cooperative, alert, well developed, in no acute distress. HEENT: Conjunctivae and lids unremarkable. Cardiovascular: Regular rhythm.  Lungs: Normal work of breathing. Neurologic: No focal deficits.   Lab Results  Component Value Date   CREATININE 0.62 03/06/2022   BUN 11 03/06/2022   NA 139 03/06/2022   K 4.6 03/06/2022   CL 103 03/06/2022   CO2 20 03/06/2022   Lab Results  Component Value Date   ALT 23 03/06/2022   AST 18 03/06/2022   ALKPHOS 115 03/06/2022   BILITOT 0.2 03/06/2022   Lab Results  Component Value Date   HGBA1C 5.5 03/06/2022   HGBA1C 5.6 09/21/2021   HGBA1C 5.6 02/04/2021   HGBA1C 5.4 09/16/2020   HGBA1C 5.0 02/09/2017   Lab Results  Component Value Date   INSULIN 20.2 03/06/2022   INSULIN 37.1 (H) 09/21/2021   INSULIN 17.6 02/04/2021   INSULIN 16.1 09/16/2020   Lab Results  Component Value Date   TSH 1.68 08/18/2021   Lab Results  Component Value Date   CHOL 155 09/16/2020   HDL 46 09/16/2020   LDLCALC 97 09/16/2020   TRIG 62 09/16/2020   Lab Results  Component Value Date   VD25OH 36.9 03/06/2022   VD25OH 25.2 (L) 09/21/2021   VD25OH 48.9 02/04/2021   Lab Results  Component Value Date   WBC 7.2 05/25/2022   HGB 10.0 (L) 05/25/2022   HCT 32.5 (L) 05/25/2022   MCV 82.3 05/25/2022   PLT 502 (H) 05/25/2022   Lab Results  Component Value Date   IRON 11 (L) 05/25/2022   TIBC 475 (H) 05/25/2022   FERRITIN 8 (L) 05/25/2022    Attestation Statements:   Reviewed by clinician on day of visit: allergies, medications, problem list, medical history, surgical history, family history, social history, and previous encounter notes.  I have reviewed the above documentation for accuracy and completeness, and I agree with the above. -  Debra Horn d. Amara Justen, NP-C

## 2022-08-30 ENCOUNTER — Telehealth (INDEPENDENT_AMBULATORY_CARE_PROVIDER_SITE_OTHER): Payer: Self-pay

## 2022-08-30 LAB — COMPREHENSIVE METABOLIC PANEL
ALT: 19 IU/L (ref 0–32)
AST: 17 IU/L (ref 0–40)
Albumin: 4.2 g/dL (ref 3.9–4.9)
Alkaline Phosphatase: 117 IU/L (ref 44–121)
BUN/Creatinine Ratio: 12 (ref 9–23)
BUN: 8 mg/dL (ref 6–20)
Bilirubin Total: 0.3 mg/dL (ref 0.0–1.2)
CO2: 23 mmol/L (ref 20–29)
Calcium: 9.3 mg/dL (ref 8.7–10.2)
Chloride: 101 mmol/L (ref 96–106)
Creatinine, Ser: 0.66 mg/dL (ref 0.57–1.00)
Globulin, Total: 3.1 g/dL (ref 1.5–4.5)
Glucose: 76 mg/dL (ref 70–99)
Potassium: 4.1 mmol/L (ref 3.5–5.2)
Sodium: 138 mmol/L (ref 134–144)
Total Protein: 7.3 g/dL (ref 6.0–8.5)
eGFR: 116 mL/min/{1.73_m2} (ref >59)

## 2022-08-30 LAB — VITAMIN B12: Vitamin B-12: 406 pg/mL (ref 232–1245)

## 2022-08-30 LAB — HEMOGLOBIN A1C
Est. average glucose Bld gHb Est-mCnc: 103 mg/dL
Hgb A1c MFr Bld: 5.2 % (ref 4.8–5.6)

## 2022-08-30 LAB — INSULIN, RANDOM: INSULIN: 9.3 u[IU]/mL (ref 2.6–24.9)

## 2022-08-30 LAB — VITAMIN D 25 HYDROXY (VIT D DEFICIENCY, FRACTURES): Vit D, 25-Hydroxy: 28.5 ng/mL — ABNORMAL LOW (ref 30.0–100.0)

## 2022-08-30 NOTE — Telephone Encounter (Signed)
PA submitted for [facility] to Medicaid. Waiting on determination.8:43 08/30/2022 KP

## 2022-08-31 ENCOUNTER — Other Ambulatory Visit (HOSPITAL_BASED_OUTPATIENT_CLINIC_OR_DEPARTMENT_OTHER): Payer: Self-pay

## 2022-08-31 ENCOUNTER — Encounter: Payer: Self-pay | Admitting: Physician Assistant

## 2022-08-31 ENCOUNTER — Other Ambulatory Visit (HOSPITAL_COMMUNITY): Payer: Self-pay

## 2022-08-31 ENCOUNTER — Other Ambulatory Visit: Payer: Self-pay

## 2022-08-31 NOTE — Telephone Encounter (Signed)
First PA with [employer] was denied, submitted a second time with Medicaid, denied second time as well. Called and submitted a third time on 08/30/22, waiting on a determination via fax. 7:00 08/31/22 KP

## 2022-08-31 NOTE — Telephone Encounter (Signed)
PA for [facility] has been approved through 02/26/23. 10:06 08/31/22 KP

## 2022-09-02 ENCOUNTER — Ambulatory Visit
Admission: EM | Admit: 2022-09-02 | Discharge: 2022-09-02 | Disposition: A | Discharge status: Home / Self Care | Payer: 59 | Attending: Physician Assistant | Admitting: Physician Assistant

## 2022-09-02 DIAGNOSIS — U071 COVID-19: Secondary | ICD-10-CM | POA: Insufficient documentation

## 2022-09-02 DIAGNOSIS — R509 Fever, unspecified: Secondary | ICD-10-CM

## 2022-09-02 DIAGNOSIS — J069 Acute upper respiratory infection, unspecified: Secondary | ICD-10-CM

## 2022-09-02 LAB — POCT URINALYSIS DIP (MANUAL ENTRY)
Bilirubin, UA: NEGATIVE
Blood, UA: NEGATIVE
Glucose, UA: NEGATIVE mg/dL
Ketones, POC UA: NEGATIVE mg/dL
Leukocytes, UA: NEGATIVE
Nitrite, UA: NEGATIVE
Protein Ur, POC: NEGATIVE mg/dL
Spec Grav, UA: 1.02 (ref 1.010–1.025)
Urobilinogen, UA: 0.2 E.U./dL
pH, UA: 8.5 — AB (ref 5.0–8.0)

## 2022-09-02 LAB — POCT FASTING CBG KUC MANUAL ENTRY: POCT Glucose (KUC): 96 mg/dL (ref 70–99)

## 2022-09-02 LAB — POCT URINE PREGNANCY: Preg Test, Ur: NEGATIVE

## 2022-09-02 MED ORDER — ACETAMINOPHEN 325 MG PO TABS
650.0000 mg | ORAL_TABLET | Freq: Once | ORAL | Status: AC
  Administered 2022-09-02: 650 mg via ORAL

## 2022-09-02 NOTE — ED Triage Notes (Signed)
"  I have been feeling sick all night, feeling funny the last few days, taking Alka Seltzer". Current symptoms include "aches, more legs". Vomiting "1x earlier". "Peeing a lot". SOB "at times". No fever known. Some cough "last 2 days". No runny nose. Voids "fine other than peeing a lot". Stools "normal".

## 2022-09-02 NOTE — ED Provider Notes (Signed)
[facility]    CSN: [id number] Arrival date & time: 09/02/22  1129      History   Chief Complaint Chief Complaint  Patient presents with   Generalized Body Aches   Emesis    HPI Debra Horn is a 37 y.o. female.   Patient here today for evaluation of feeling sick the last several days.  She reports that yesterday she started to have fever and has developed body aches.  She notes she has had urinary frequency and nausea with 1 episode of vomiting.  She has not had runny nose.  She denies any diarrhea.  She has taken Alka-Seltzer without resolution of symptoms.  She notes several people at work have COVID.  The history is provided by the patient.  Emesis Associated symptoms: chills, cough, fever and myalgias   Associated symptoms: no abdominal pain and no diarrhea     Past Medical History:  Diagnosis Date   Anemia    low iron   Asthma    Complication of anesthesia    bp dropped in middle of last c-section and started with n/v   GERD (gastroesophageal reflux disease)    during pregnancy   Headache    "couple days/week" (10/28/2015)   Hyperthyroidism    not on medication   PE (pulmonary embolism) 11/2011   PONV (postoperative nausea and vomiting)    Pregnancy induced hypertension    Preterm labor    Sleep apnea    SOB (shortness of breath)    Thyroid condition    Vitamin D deficiency     Patient Active Problem List   Diagnosis Date Noted   Iron deficiency anemia 05/25/2022   Shortness of breath 08/22/2021   Moderate persistent asthma with exacerbation 07/28/2021   Diffuse pain 05/31/2021   Allergic reaction 05/11/2021   Iron deficiency anemia due to chronic blood loss 03/07/2021   Low folate 03/07/2021   Class 3 severe obesity with serious comorbidity and body mass index (BMI) of 40.0 to 44.9 in adult (HCC) 03/03/2021   CRP elevated 12/01/2020   Elevated sed rate 12/01/2020   Nasal drainage 11/09/2020   Insulin resistance 10/26/2020    Other chest pain 10/13/2020   Severe sleep apnea 06/24/2020   BMI 45.0-49.9, adult (HCC) 06/24/2020   Status post repeat low transverse cesarean section 07/27/2017   Poor fetal growth affecting management of mother in third trimester    Previous pregnancy complicated by pregnancy-induced hypertension in third trimester, antepartum    IUGR (intrauterine growth restriction) in prior pregnancy, pregnant 03/06/2017   Vitamin D deficiency 02/17/2017   History of preterm delivery 02/09/2017   History of asthma    Normocytic anemia 02/06/2016   Family history of systemic lupus erythematosus (SLE) in mother 02/19/2014   Hyperthyroidism 02/19/2014   Previous cesarean section 08/08/2012   History of pulmonary embolus (PE) 07/24/2012    Past Surgical History:  Procedure Laterality Date   CESAREAN SECTION  2008; 2014   CESAREAN SECTION N/A 07/27/2017   Procedure: REPEAT CESAREAN SECTION;  Surgeon: Reva Bores, MD;  Location: [facility]THING SUIT[facility];  Service: Obstetrics;  Laterality: N/A;   FRACTURE SURGERY     ORIF ANKLE FRACTURE Right 10/28/2015   ORIF ANKLE FRACTURE Right 10/28/2015   Procedure: OPEN REDUCTION INTERNAL FIXATION (ORIF) RIGHT ANKLE FRACTURE;  Surgeon: Yolonda Kida, MD;  Location: [facility] OR;  Service: Orthopedics;  Laterality: Right;   TUBAL LIGATION Bilateral 07/27/2017   Procedure: BILATERAL TUBAL LIGATION;  Surgeon: Shawnie Pons,  Shelbie Proctor, MD;  Location: [facility] BIRTHING SUITES;  Service: Obstetrics;  Laterality: Bilateral;    OB History     Gravida  3   Para  3   Term  1   Preterm  2   AB      Living  3      SAB      IAB      Ectopic      Multiple  0   Live Births  3            Home Medications    Prior to Admission medications   Medication Sig Start Date End Date Taking? Authorizing Provider  albuterol (VENTOLIN HFA) 108 (90 Base) MCG/ACT inhaler Inhale 1-2 puffs into the lungs every 4 (four) hours as needed for wheezing or shortness of breath. 07/13/22   Yes Freddy Finner, NP  aspirin-sod bicarb-citric acid (ALKA-SELTZER) 325 MG TBEF tablet Take 325 mg by mouth every 6 (six) hours as needed.   Yes [provider]  albuterol (VENTOLIN HFA) 108 (90 Base) MCG/ACT inhaler Inhale 2 puffs into the lungs every 6 (six) hours as needed for wheezing or shortness of breath. 05/10/22   [name], FNP  FeFum-FePoly-FA-B Cmp-C-Biot (INTEGRA PLUS) CAPS Take 1 capsule by mouth every morning. 05/25/22   Heilingoetter, Cassandra L, PA-C  FLUoxetine (PROZAC) 10 MG capsule Take 1 capsule (10 mg total) by mouth daily. 08/29/22   Danford, Orpha Bur D, NP  fluticasone (FLONASE) 50 MCG/ACT nasal spray SPRAY 2 SPRAYS INTO EACH NOSTRIL EVERY DAY Patient taking differently: Place 2 sprays into both nostrils daily. 12/13/20   Danford, Orpha Bur D, NP  fluticasone (FLOVENT HFA) 110 MCG/ACT inhaler Inhale 2 puffs into the lungs in the morning and at bedtime. 07/13/22   Freddy Finner, NP  folic acid (FOLVITE) 1 MG tablet Take 1 tablet (1 mg total) by mouth daily. 05/27/22   Heilingoetter, Cassandra L, PA-C  loratadine (CLARITIN) 10 MG tablet TAKE 1 TABLET BY MOUTH EVERY DAY 06/05/22   Brock Bad, MD  metFORMIN (GLUCOPHAGE-XR) 500 MG 24 hr tablet 11/28/21 one full daily with full meal 05/15/22   Danford, Katy D, NP  metroNIDAZOLE (METROGEL) 0.75 % vaginal gel PLACE 1 APPLICATORFUL VAGINALLY 2 (TWO) TIMES DAILY. USE MONTHLY AFTER THE PERIOD FOR 6 MONTHS 06/05/22   Brock Bad, MD  montelukast (SINGULAIR) 10 MG tablet Take 1 tablet (10 mg total) by mouth at bedtime. 07/13/22   Freddy Finner, NP  Semaglutide-Weight Management (WEGOVY) 0.25 MG/0.5ML SOAJ Inject 0.25 mg into the skin once a week. 08/29/22   William Hamburger D, NP  Vitamin D, Ergocalciferol, (DRISDOL) 1.25 MG (50000 UNIT) CAPS capsule Take once per week 05/15/22   Julaine Fusi, NP    Family History Family History  Problem Relation Age of Onset   Rheum arthritis Mother    Lupus Mother    Arthritis Mother     Hypertension Father    Diabetes Father    Cancer Father    Cancer Sister        skin   Cancer Maternal Grandmother        leukemia   Hypertension Maternal Grandfather    Stroke Maternal Grandfather    Hypertension Paternal Grandmother    Cancer Paternal Grandmother    Hyperlipidemia Paternal Grandmother    Diabetes Paternal Grandmother    Hypothyroidism Maternal Aunt    Lupus Other     Social History Social History   Tobacco Use  Smoking status: Never   Smokeless tobacco: Never  Vaping Use   Vaping status: Never Used  Substance Use Topics   Alcohol use: No   Drug use: No     Allergies   Banana and Cefixime   Review of Systems Review of Systems  Constitutional:  Positive for chills and fever.  HENT:  Positive for congestion. Negative for rhinorrhea.   Eyes:  Negative for discharge and redness.  Respiratory:  Positive for cough. Negative for wheezing.   Gastrointestinal:  Positive for nausea and vomiting. Negative for abdominal pain, constipation and diarrhea.  Genitourinary:  Positive for frequency. Negative for dysuria.  Musculoskeletal:  Positive for myalgias.     Physical Exam Triage Vital Signs ED Triage Vitals  Encounter Vitals Group     BP 09/02/22 1139 125/84     Systolic BP Percentile --      Diastolic BP Percentile --      Pulse Rate 09/02/22 1139 (!) 113     Resp 09/02/22 1139 (!) 22     Temp 09/02/22 1139 (!) 102.5 F (39.2 C)     Temp Source 09/02/22 1139 Oral     SpO2 09/02/22 1139 97 %     Weight 09/02/22 1137 258 lb (117 kg)     Height 09/02/22 1137 5\' 2"  (1.575 m)     Head Circumference --      Peak Flow --      Pain Score 09/02/22 1131 0     Pain Loc --      Pain Education --      Exclude from Growth Chart --    No data found.  Updated Vital Signs BP 125/84 (BP Location: Left Arm)   Pulse (!) 113   Temp 99.7 F (37.6 C) (Oral)   Resp (!) 22   Ht 5\' 2"  (1.575 m)   Wt 258 lb (117 kg)   LMP 08/30/2022 (Exact Date)   SpO2  97%   BMI 47.19 kg/m      Physical Exam Vitals and nursing note reviewed.  Constitutional:      General: She is not in acute distress.    Appearance: Normal appearance. She is not ill-appearing.  HENT:     Head: Normocephalic and atraumatic.     Nose: Congestion present.     Mouth/Throat:     Mouth: Mucous membranes are moist.     Pharynx: Oropharynx is clear. No oropharyngeal exudate or posterior oropharyngeal erythema.  Eyes:     Conjunctiva/sclera: Conjunctivae normal.  Cardiovascular:     Rate and Rhythm: Normal rate and regular rhythm.  Pulmonary:     Effort: Pulmonary effort is normal. No respiratory distress.     Breath sounds: Normal breath sounds. No wheezing, rhonchi or rales.  Neurological:     Mental Status: She is alert.  Psychiatric:        Mood and Affect: Mood normal.        Behavior: Behavior normal.        Thought Content: Thought content normal.      UC Treatments / Results  Labs (all labs ordered are listed, but only abnormal results are displayed) Labs Reviewed  POCT URINALYSIS DIP (MANUAL ENTRY) - Abnormal; Notable for the following components:      Result Value   pH, UA 8.5 (*)    All other components within normal limits  SARS CORONAVIRUS 2 (TAT 6-24 HRS)  POCT FASTING CBG [facility] MANUAL ENTRY  POCT URINE PREGNANCY  EKG   Radiology No results found.  Procedures Procedures (including critical care time)  Medications Ordered in UC Medications  acetaminophen (TYLENOL) tablet 650 mg (650 mg Oral Given 09/02/22 1144)    Initial Impression / Assessment and Plan / UC Course  I have reviewed the triage vital signs and the nursing notes.  Pertinent labs & imaging results that were available during my care of the patient were reviewed by me and considered in my medical decision making (see chart for details).    Tylenol administered in office with improvement of fever.  Suspect likely viral etiology of symptoms and will screen for COVID.   UA without signs of UTI.  Encouraged symptomatic treatment, increase fluids and rest with follow-up if no gradual improvement with any worsening symptoms.  Final Clinical Impressions(s) / UC Diagnoses   Final diagnoses:  Fever, unspecified  Viral upper respiratory tract infection   Discharge Instructions   None    ED Prescriptions   None    PDMP not reviewed this encounter.   Tomi Bamberger, PA-C 09/02/22 1324

## 2022-09-03 LAB — SARS CORONAVIRUS 2 (TAT 6-24 HRS): SARS Coronavirus 2: POSITIVE — AB

## 2022-09-05 ENCOUNTER — Other Ambulatory Visit (HOSPITAL_COMMUNITY): Payer: Self-pay

## 2022-09-05 NOTE — Progress Notes (Deleted)
[facility] OFFICE PROGRESS NOTE  Avanell Shackleton, NP-C [address] [place] [address]  DIAGNOSIS: Persistent iron deficiency anemia secondary to menorrhagia. The patient also has thrombocytosis that is reactive in nature secondary to the iron deficiency. She is unable to correct her iron deficiency with the oral supplements.   PRIOR THERAPY: None  CURRENT THERAPY: 1) IV iron PRN with venofer 300 mg weekly, most recent dose on 04/14/22. She only received one dose due to infusion reaction. Will switch to feraheme moving forward with premedications with benadryl and tylenol.  Most recent dose on 06/16/2022 2) Folic acid 1 mg p.o. daily 3) Oral iron supplements, will restart today ***on 05/25/22  INTERVAL HISTORY: Debra Horn 37 y.o. female returns  to the clinic today for a follow-up visit.  The patient establish care with a clinic on 03/07/2021.  She had been lost to follow-up but reestablished on 05/25/2022.  The patient is followed for iron deficiency anemia secondary to menorrhagia.  She also was found to have folic acid deficiency and she is currently taking folic acid 1 mg p.o. daily.  She resumed this ***at her last appointment confirm?  Presumed iron?  She previously had a reaction to IV iron and moving forward has been changed to Feraheme.  Her reaction included fever and hallucinations.  She also did not have any premedications at this time.  Since last being seen she underwent IV iron with Feraheme the most recent being on 06/16/2022 to tolerate this well without any concerning adverse side effects  The patient is also being considered for bariatric surgery but her surgeon would like her to correct her anemia and they are scheduled to see her *On **Last being seen the patient *is feeling**.  Low energy?  Denies any dizziness.  Reports dyspnea on exertion.  Heavy menstrual cycles?  She is not able to take birth control due to her history of pulmonary embolisms on  birth control.  Denies any abnormal bleeding or bruising.  She is here today for evaluation repeat blood work   MEDICAL HISTORY: Past Medical History:  Diagnosis Date   Anemia    low iron   Asthma    Complication of anesthesia    bp dropped in middle of last c-section and started with n/v   GERD (gastroesophageal reflux disease)    during pregnancy   Headache    "couple days/week" (10/28/2015)   Hyperthyroidism    not on medication   PE (pulmonary embolism) 11/2011   PONV (postoperative nausea and vomiting)    Pregnancy induced hypertension    Preterm labor    Sleep apnea    SOB (shortness of breath)    Thyroid condition    Vitamin D deficiency     ALLERGIES:  is allergic to banana and cefixime.  MEDICATIONS:  Current Outpatient Medications  Medication Sig Dispense Refill   albuterol (VENTOLIN HFA) 108 (90 Base) MCG/ACT inhaler Inhale 2 puffs into the lungs every 6 (six) hours as needed for wheezing or shortness of breath. 8 g 2   albuterol (VENTOLIN HFA) 108 (90 Base) MCG/ACT inhaler Inhale 1-2 puffs into the lungs every 4 (four) hours as needed for wheezing or shortness of breath. 18 g 1   aspirin-sod bicarb-citric acid (ALKA-SELTZER) 325 MG TBEF tablet Take 325 mg by mouth every 6 (six) hours as needed.     FeFum-FePoly-FA-B Cmp-C-Biot (INTEGRA PLUS) CAPS Take 1 capsule by mouth every morning. 30 capsule 2   FLUoxetine (  PROZAC) 10 MG capsule Take 1 capsule (10 mg total) by mouth daily. 90 capsule 0   fluticasone (FLONASE) 50 MCG/ACT nasal spray SPRAY 2 SPRAYS INTO EACH NOSTRIL EVERY DAY (Patient taking differently: Place 2 sprays into both nostrils daily.) 16 mL 0   fluticasone (FLOVENT HFA) 110 MCG/ACT inhaler Inhale 2 puffs into the lungs in the morning and at bedtime. 1 each 1   folic acid (FOLVITE) 1 MG tablet Take 1 tablet (1 mg total) by mouth daily. 90 tablet 1   loratadine (CLARITIN) 10 MG tablet TAKE 1 TABLET BY MOUTH EVERY DAY 30 tablet 11   metFORMIN  (GLUCOPHAGE-XR) 500 MG 24 hr tablet 11/28/21 one full daily with full meal 30 tablet 0   metroNIDAZOLE (METROGEL) 0.75 % vaginal gel PLACE 1 APPLICATORFUL VAGINALLY 2 (TWO) TIMES DAILY. USE MONTHLY AFTER THE PERIOD FOR 6 MONTHS 70 g 5   montelukast (SINGULAIR) 10 MG tablet Take 1 tablet (10 mg total) by mouth at bedtime. 30 tablet 1   Semaglutide-Weight Management (WEGOVY) 0.25 MG/0.5ML SOAJ Inject 0.25 mg into the skin once a week. 2 mL 0   Vitamin D, Ergocalciferol, (DRISDOL) 1.25 MG (50000 UNIT) CAPS capsule Take once per week 4 capsule 0   No current facility-administered medications for this visit.    SURGICAL HISTORY:  Past Surgical History:  Procedure Laterality Date   CESAREAN SECTION  2008; 2014   CESAREAN SECTION N/A 07/27/2017   Procedure: REPEAT CESAREAN SECTION;  Surgeon: Reva Bores, MD;  Location: [facility] BIRTHING SUITES;  Service: Obstetrics;  Laterality: N/A;   FRACTURE SURGERY     ORIF ANKLE FRACTURE Right 10/28/2015   ORIF ANKLE FRACTURE Right 10/28/2015   Procedure: OPEN REDUCTION INTERNAL FIXATION (ORIF) RIGHT ANKLE FRACTURE;  Surgeon: Yolonda Kida, MD;  Location: [facility] OR;  Service: Orthopedics;  Laterality: Right;   TUBAL LIGATION Bilateral 07/27/2017   Procedure: BILATERAL TUBAL LIGATION;  Surgeon: Reva Bores, MD;  Location: [facility] BIRTHING SUITES;  Service: Obstetrics;  Laterality: Bilateral;    REVIEW OF SYSTEMS:   Review of Systems  Constitutional: Negative for appetite change, chills, fatigue, fever and unexpected weight change.  HENT:   Negative for mouth sores, nosebleeds, sore throat and trouble swallowing.   Eyes: Negative for eye problems and icterus.  Respiratory: Negative for cough, hemoptysis, shortness of breath and wheezing.   Cardiovascular: Negative for chest pain and leg swelling.  Gastrointestinal: Negative for abdominal pain, constipation, diarrhea, nausea and vomiting.  Genitourinary: Negative for bladder incontinence, difficulty  urinating, dysuria, frequency and hematuria.   Musculoskeletal: Negative for back pain, gait problem, neck pain and neck stiffness.  Skin: Negative for itching and rash.  Neurological: Negative for dizziness, extremity weakness, gait problem, headaches, light-headedness and seizures.  Hematological: Negative for adenopathy. Does not bruise/bleed easily.  Psychiatric/Behavioral: Negative for confusion, depression and sleep disturbance. The patient is not nervous/anxious.     PHYSICAL EXAMINATION:  Last menstrual period 08/30/2022.  ECOG PERFORMANCE STATUS: {CHL ONC ECOG [id number]  Physical Exam  Constitutional: Oriented to person, place, and time and well-developed, well-nourished, and in no distress. No distress.  HENT:  Head: Normocephalic and atraumatic.  Mouth/Throat: Oropharynx is clear and moist. No oropharyngeal exudate.  Eyes: Conjunctivae are normal. Right eye exhibits no discharge. Left eye exhibits no discharge. No scleral icterus.  Neck: Normal range of motion. Neck supple.  Cardiovascular: Normal rate, regular rhythm, normal heart sounds and intact distal pulses.   Pulmonary/Chest: Effort normal and breath sounds normal. No respiratory  distress. No wheezes. No rales.  Abdominal: Soft. Bowel sounds are normal. Exhibits no distension and no mass. There is no tenderness.  Musculoskeletal: Normal range of motion. Exhibits no edema.  Lymphadenopathy:    No cervical adenopathy.  Neurological: Alert and oriented to person, place, and time. Exhibits normal muscle tone. Gait normal. Coordination normal.  Skin: Skin is warm and dry. No rash noted. Not diaphoretic. No erythema. No pallor.  Psychiatric: Mood, memory and judgment normal.  Vitals reviewed.  LABORATORY DATA: Lab Results  Component Value Date   WBC 7.2 05/25/2022   HGB 10.0 (L) 05/25/2022   HCT 32.5 (L) 05/25/2022   MCV 82.3 05/25/2022   PLT 502 (H) 05/25/2022      Chemistry      Component Value Date/Time    NA 138 08/29/2022 0749   K 4.1 08/29/2022 0749   CL 101 08/29/2022 0749   CO2 23 08/29/2022 0749   BUN 8 08/29/2022 0749   CREATININE 0.66 08/29/2022 0749   CREATININE 0.76 03/07/2021 1130      Component Value Date/Time   CALCIUM 9.3 08/29/2022 0749   ALKPHOS 117 08/29/2022 0749   AST 17 08/29/2022 0749   AST 11 (L) 03/07/2021 1130   ALT 19 08/29/2022 0749   ALT 12 03/07/2021 1130   BILITOT 0.3 08/29/2022 0749   BILITOT 0.3 03/07/2021 1130       RADIOGRAPHIC STUDIES:  No results found.   ASSESSMENT/PLAN:  This is a very pleasant 37 year old African-American female with iron deficiency anemia secondary to menorrhagia.  She also has reactive thrombocytosis secondary to her iron deficiency.   The patient was seen by Dr. Arbutus Ped in 2023.  She received 1 dose IV iron but had an allergic reaction.  She did not receive any premedications.  It was recommended that she follow-up in 3 months but she has not been seen since March 2023.  She eventually reestablish care in May 2024.  She underwent IV iron with Feraheme most recent being on 06/16/2022 and she tolerated this well without any concerning adverse.  She was premedicated with Tylenol and Benadryl.  When his surgery?***  The patient had repeat iron studies and CBC drawn today.  Her labs **  *I will arrange for** Back for follow-up visit in***  Folic acid and iron supplement?  Integra plus question  The patient was advised to call immediately if she has any concerning symptoms in the interval. The patient voices understanding of current disease status and treatment options and is in agreement with the current care plan. All questions were answered. The patient knows to call the clinic with any problems, questions or concerns. We can certainly see the patient much sooner if necessary    No orders of the defined types were placed in this encounter.    I spent {CHL ONC TIME VISIT - FUXNA:[id number]} counseling the patient  face to face. The total time spent in the appointment was {CHL ONC TIME VISIT - KYHCW:[id number]}.  Asyia Hornung L Sherryl Valido, PA-C 09/05/22

## 2022-09-06 ENCOUNTER — Other Ambulatory Visit (HOSPITAL_COMMUNITY): Payer: Self-pay

## 2022-09-06 ENCOUNTER — Ambulatory Visit: Payer: 59 | Admitting: Family Medicine

## 2022-09-07 ENCOUNTER — Encounter: Payer: Self-pay | Admitting: Family Medicine

## 2022-09-08 ENCOUNTER — Inpatient Hospital Stay: Payer: 59 | Admitting: Physician Assistant

## 2022-09-08 ENCOUNTER — Inpatient Hospital Stay: Payer: 59 | Attending: Internal Medicine

## 2022-09-12 ENCOUNTER — Ambulatory Visit: Payer: BC Managed Care – PPO | Admitting: Dietician

## 2022-09-15 ENCOUNTER — Encounter: Payer: Self-pay | Admitting: Physician Assistant

## 2022-09-22 ENCOUNTER — Telehealth: Payer: 59 | Admitting: Physician Assistant

## 2022-09-22 DIAGNOSIS — J4541 Moderate persistent asthma with (acute) exacerbation: Secondary | ICD-10-CM

## 2022-09-22 MED ORDER — PREDNISONE 20 MG PO TABS
40.0000 mg | ORAL_TABLET | Freq: Every day | ORAL | 0 refills | Status: DC
Start: 2022-09-22 — End: 2022-12-07

## 2022-09-22 NOTE — Progress Notes (Signed)
E-Visit for Asthma  Based on what you have shared with me, it looks like you may have a flare up of your asthma.  Asthma is a chronic (ongoing) lung disease which results in airway obstruction, inflammation and hyper-responsiveness.   Asthma symptoms vary from person to person, with common symptoms including nighttime awakening and decreased ability to participate in normal activities as a result of shortness of breath. It is often triggered by changes in weather, changes in the season, changes in air temperature, or inside (home, school, daycare or work) allergens such as animal dander, mold, mildew, woodstoves or cockroaches.   It can also be triggered by hormonal changes, extreme emotion, physical exertion or an upper respiratory tract illness.     It is important to identify the trigger, and then eliminate or avoid the trigger if possible.   If you have been prescribed medications to be taken on a regular basis, it is important to follow the asthma action plan and to follow guidelines to adjust medication in response to increasing symptoms of decreased peak expiratory flow rate  Treatment: I have prescribed: Prednisone 40mg  by mouth per day for 5 - 7 days  HOME CARE Only take medications as instructed by your medical team. Consider wearing a mask or scarf to improve breathing air temperature have been shown to decrease irritation and decrease exacerbations Get rest. Taking a steamy shower or using a humidifier may help nasal congestion sand ease sore throat pain. You can place a towel over your head and breathe in the steam from hot water coming from a faucet. Using a saline nasal spray works much the same way.  Cough drops, hare candies and sore throat lozenges may ease your cough.  Avoid close contacts especially the very you and the elderly Cover your mouth if you cough or  sneeze Always remember to wash your hands.    GET HELP RIGHT AWAY IF: You develop worsening symptoms; breathlessness at rest, drowsy, confused or agitated, unable to speak in full sentences You have coughing fits You develop a severe headache or visual changes You develop shortness of breath, difficulty breathing or start having chest pain Your symptoms persist after you have completed your treatment plan If your symptoms do not improve within 10 days  MAKE SURE YOU Understand these instructions. Will watch your condition. Will get help right away if you are not doing well or get worse.   Your e-visit answers were reviewed by a board certified advanced clinical practitioner to complete your personal care plan, Depending upon the condition, your plan could have included both over the counter or prescription medications.   Please review your pharmacy choice. Your safety is important to [place]. If you have drug allergies check your prescription carefully.  You can use MyChart to ask questions about today's visit, request a non-urgent  call back, or ask for a work or school excuse for 24 hours related to this e-Visit. If it has been greater than 24 hours you will need to follow up with your provider, or enter a new e-Visit to address those concerns.   You will get an e-mail in the next two days asking about your experience. I hope that your e-visit has been valuable and will speed your recovery. Thank you for using e-visits.  I have spent 5 minutes in review of e-visit questionnaire, review and updating patient chart, medical decision making and response to patient.   Margaretann Loveless, PA-C

## 2022-09-25 ENCOUNTER — Encounter: Payer: Self-pay | Admitting: Physician Assistant

## 2022-09-26 ENCOUNTER — Ambulatory Visit (INDEPENDENT_AMBULATORY_CARE_PROVIDER_SITE_OTHER): Payer: 59 | Admitting: Adult Health

## 2022-09-26 ENCOUNTER — Other Ambulatory Visit (HOSPITAL_COMMUNITY): Payer: Self-pay

## 2022-09-26 ENCOUNTER — Encounter (INDEPENDENT_AMBULATORY_CARE_PROVIDER_SITE_OTHER): Payer: Self-pay | Admitting: Adult Health

## 2022-09-26 DIAGNOSIS — Z6841 Body Mass Index (BMI) 40.0 and over, adult: Secondary | ICD-10-CM

## 2022-09-26 DIAGNOSIS — E669 Obesity, unspecified: Secondary | ICD-10-CM | POA: Diagnosis not present

## 2022-09-26 DIAGNOSIS — E559 Vitamin D deficiency, unspecified: Secondary | ICD-10-CM

## 2022-09-26 DIAGNOSIS — E88819 Insulin resistance, unspecified: Secondary | ICD-10-CM

## 2022-09-26 MED ORDER — WEGOVY 0.5 MG/0.5ML ~~LOC~~ SOAJ
0.5000 mg | SUBCUTANEOUS | 0 refills | Status: DC
  Filled 2022-09-26: qty 2, 28d supply, fill #0

## 2022-09-26 MED ORDER — VITAMIN D (ERGOCALCIFEROL) 1.25 MG (50000 UNIT) PO CAPS
50000.0000 [IU] | ORAL_CAPSULE | ORAL | 0 refills | Status: DC
  Filled 2022-09-26 – 2022-10-07 (×2): qty 4, 28d supply, fill #0

## 2022-09-26 MED ORDER — METFORMIN HCL ER 500 MG PO TB24
500.0000 mg | ORAL_TABLET | Freq: Every day | ORAL | 0 refills | Status: DC
  Filled 2022-09-26 – 2022-11-14 (×3): qty 30, 30d supply, fill #0

## 2022-09-26 NOTE — Progress Notes (Signed)
WEIGHT SUMMARY AND BIOMETRICS  Vitals Temp: 98.2 F (36.8 C) BP: 114/76 Pulse Rate: 68 SpO2: 96 %   Anthropometric Measurements Height: 5\' 2"  (1.575 m) Weight: 254 lb (115.2 kg) BMI (Calculated): 46.45 Weight at Last Visit: 258 lb Weight Lost Since Last Visit: 4 lb Weight Gained Since Last Visit: 0 Starting Weight: 244 lb Total Weight Loss (lbs): 0 lb (0 kg) Peak Weight: 273 lb   Body Composition  Body Fat %: 51.7 % Fat Mass (lbs): 131.8 lbs Muscle Mass (lbs): 116.8 lbs Total Body Water (lbs): 83.8 lbs Visceral Fat Rating : 16   Other Clinical Data Fasting: yes Labs: no Today's Visit #: 25 Starting Date: 09/15/20    Chief Complaint:   OBESITY Debra Horn is here to discuss her progress with her obesity treatment plan. She is on the the Category 2 Plan and states she is following her eating plan approximately 25 % of the time. She states she is not currently exercising.   Interim History:  Debra Horn was started on loading dose Wegovy 0.25mg  on/about 08/29/2022 Denies mass in neck, dysphagia, dyspepsia, persistent hoarseness, abdominal pain, or constipation. She experienced nausea without vomiting after the first two injections. No GI upset after 3rd injections.  She reports chewing gum all day to avoid eating then will consume her full dinner meal at work.   Discussed at length the IMPORTANCE of consuming all foods on prescribed meal plan. Discussed hoe to  meal plan and prep to supply the necessary food each day.  Subjective:   1. Insulin resistance Discussed Labs  Latest Reference Range & Units 08/29/22 07:49  Glucose 70 - 99 mg/dL 76  Hemoglobin E9B 4.8 - 5.6 % 5.2  Est. average glucose Bld gHb Est-mCnc mg/dL 284  INSULIN 2.6 - 13.2 uIU/mL 9.3   CBG, A1c, and Insulin- all levels improved. Debra Horn was started on loading dose Wegovy 0.25mg  on/about 08/29/2022 Denies mass in neck, dysphagia, dyspepsia, persistent hoarseness, abdominal pain, or  constipation. She experienced nausea without vomiting after the first two injections. No GI upset after 3rd injections.  2. Vitamin D deficiency Discussed Labs  Latest Reference Range & Units 08/29/22 07:49  Vitamin D, 25-Hydroxy 30.0 - 100.0 ng/mL 28.5 (L)  Vitamin B12 232 - 1,245 pg/mL 406  (L): Data is abnormally low  She has been inconsistent with weekly Ergocalciferol She reports persistent fatigue Vit d Level below goal  Assessment/Plan:   1. Insulin resistance Refill - metFORMIN (GLUCOPHAGE-XR) 500 MG 24 hr tablet; 11/28/21 one full daily with full meal  Dispense: 30 tablet; Refill: 0  2. Vitamin D deficiency Refill - Vitamin D, Ergocalciferol, (DRISDOL) 1.25 MG (50000 UNIT) CAPS capsule; Take once per week  Dispense: 4 capsule; Refill: 0  3. Obesity, current BMI 46.6 Refill and increase (after 4th dose of Loading dose)  Semaglutide-Weight Management (WEGOVY) 0.5 MG/0.5ML SOAJ Inject 0.5 mg into the skin once a week. Dispense: 2 mL, Refills: 0 of 0 remaining   Breakfast- Protein shake Lunch- Meat sandwich with piece of fruit Dinner- Continue to consume prescribed dinner serving  Debra Horn is not currently in the action stage of change. As such, her goal is to get back to weightloss efforts . She has agreed to practicing portion control and making smarter food choices, such as increasing vegetables and decreasing simple carbohydrates.   Exercise goals: All adults should avoid inactivity. Some physical activity is better than none, and adults who participate in any amount of physical activity gain  some health benefits. Adults should also include muscle-strengthening activities that involve all major muscle groups on 2 or more days a week.  Behavioral modification strategies: increasing lean protein intake, decreasing simple carbohydrates, increasing vegetables, increasing water intake, no skipping meals, meal planning and cooking strategies, and planning for success.  Debra Horn  has agreed to follow-up with our clinic in 4 weeks. She was informed of the importance of frequent follow-up visits to maximize her success with intensive lifestyle modifications for her multiple health conditions.   Objective:   Blood pressure 114/76, pulse 68, temperature 98.2 F (36.8 C), height 5\' 2"  (1.575 m), weight 254 lb (115.2 kg), last menstrual period 08/30/2022, SpO2 96%. Body mass index is 46.46 kg/m.  General: Cooperative, alert, well developed, in no acute distress. HEENT: Conjunctivae and lids unremarkable. Cardiovascular: Regular rhythm.  Lungs: Normal work of breathing. Neurologic: No focal deficits.   Lab Results  Component Value Date   CREATININE 0.66 08/29/2022   BUN 8 08/29/2022   NA 138 08/29/2022   K 4.1 08/29/2022   CL 101 08/29/2022   CO2 23 08/29/2022   Lab Results  Component Value Date   ALT 19 08/29/2022   AST 17 08/29/2022   ALKPHOS 117 08/29/2022   BILITOT 0.3 08/29/2022   Lab Results  Component Value Date   HGBA1C 5.2 08/29/2022   HGBA1C 5.5 03/06/2022   HGBA1C 5.6 09/21/2021   HGBA1C 5.6 02/04/2021   HGBA1C 5.4 09/16/2020   Lab Results  Component Value Date   INSULIN 9.3 08/29/2022   INSULIN 20.2 03/06/2022   INSULIN 37.1 (H) 09/21/2021   INSULIN 17.6 02/04/2021   INSULIN 16.1 09/16/2020   Lab Results  Component Value Date   TSH 1.68 08/18/2021   Lab Results  Component Value Date   CHOL 155 09/16/2020   HDL 46 09/16/2020   LDLCALC 97 09/16/2020   TRIG 62 09/16/2020   Lab Results  Component Value Date   VD25OH 28.5 (L) 08/29/2022   VD25OH 36.9 03/06/2022   VD25OH 25.2 (L) 09/21/2021   Lab Results  Component Value Date   WBC 7.2 05/25/2022   HGB 10.0 (L) 05/25/2022   HCT 32.5 (L) 05/25/2022   MCV 82.3 05/25/2022   PLT 502 (H) 05/25/2022   Lab Results  Component Value Date   IRON 11 (L) 05/25/2022   TIBC 475 (H) 05/25/2022   FERRITIN 8 (L) 05/25/2022    Attestation Statements:   Reviewed by clinician on day  of visit: allergies, medications, problem list, medical history, surgical history, family history, social history, and previous encounter notes.  I have reviewed the above documentation for accuracy and completeness, and I agree with the above. -  Osbaldo Mark d. Versa Craton, NP-C

## 2022-09-27 ENCOUNTER — Ambulatory Visit (INDEPENDENT_AMBULATORY_CARE_PROVIDER_SITE_OTHER): Payer: Medicaid Other | Admitting: Adult Health

## 2022-10-05 ENCOUNTER — Telehealth: Payer: 59 | Admitting: Nurse Practitioner

## 2022-10-05 DIAGNOSIS — J4541 Moderate persistent asthma with (acute) exacerbation: Secondary | ICD-10-CM | POA: Diagnosis not present

## 2022-10-05 MED ORDER — ALBUTEROL SULFATE HFA 108 (90 BASE) MCG/ACT IN AERS
2.0000 | INHALATION_SPRAY | Freq: Four times a day (QID) | RESPIRATORY_TRACT | 0 refills | Status: DC | PRN
Start: 2022-10-05 — End: 2023-01-23

## 2022-10-05 NOTE — Progress Notes (Signed)
E-Visit for Asthma  Based on what you have shared with me, it looks like you may have a flare up of your asthma.  Asthma is a chronic (ongoing) lung disease which results in airway obstruction, inflammation and hyper-responsiveness.   Asthma symptoms vary from person to person, with common symptoms including nighttime awakening and decreased ability to participate in normal activities as a result of shortness of breath. It is often triggered by changes in weather, changes in the season, changes in air temperature, or inside (home, school, daycare or work) allergens such as animal dander, mold, mildew, woodstoves or cockroaches.   It can also be triggered by hormonal changes, extreme emotion, physical exertion or an upper respiratory tract illness.     It is important to identify the trigger, and then eliminate or avoid the trigger if possible.   If you have been prescribed medications to be taken on a regular basis, it is important to follow the asthma action plan and to follow guidelines to adjust medication in response to increasing symptoms of decreased peak expiratory flow rate  Treatment: I have prescribed: Albuterol (Proventil HFA; Ventolin HFA) 108 (90 Base) MCG/ACT Inhaler 2 puffs into the lungs every six hours as needed for wheezing or shortness of breath Be sure to make an appointment with pulmonology as this will be the last time we can refill this medication via E-visits  HOME CARE Only take medications as instructed by your medical team. Consider wearing a mask or scarf to improve breathing air temperature have been shown to decrease irritation and decrease exacerbations Get rest. Taking a steamy shower or using a humidifier may help nasal congestion sand ease sore throat pain. You can place a towel over your head and breathe in the steam from hot water coming from a  faucet. Using a saline nasal spray works much the same way.  Cough drops, hare candies and sore throat lozenges may ease your cough.  Avoid close contacts especially the very you and the elderly Cover your mouth if you cough or sneeze Always remember to wash your hands.    GET HELP RIGHT AWAY IF: You develop worsening symptoms; breathlessness at rest, drowsy, confused or agitated, unable to speak in full sentences You have coughing fits You develop a severe headache or visual changes You develop shortness of breath, difficulty breathing or start having chest pain Your symptoms persist after you have completed your treatment plan If your symptoms do not improve within 10 days  MAKE SURE YOU Understand these instructions. Will watch your condition. Will get help right away if you are not doing well or get worse.   Your e-visit answers were reviewed by a board certified advanced clinical practitioner to complete your personal care plan, Depending upon the condition, your plan could have included both over the counter or prescription medications.   Please review your pharmacy choice. Your safety is important to [place]. If you have drug allergies check your prescription carefully.  You can use MyChart to ask questions about today's visit, request a non-urgent  call back, or ask for a work or school excuse for 24 hours related to this e-Visit. If it has been greater than 24 hours you will need to follow up with your provider, or enter a new e-Visit to address those concerns.   You will get an e-mail in the next two days asking about your experience. I hope that your e-visit has been valuable and will speed your recovery. Thank you for using  e-visits.   Meds ordered this encounter  Medications   albuterol (VENTOLIN HFA) 108 (90 Base) MCG/ACT inhaler    Sig: Inhale 2 puffs into the lungs every 6 (six) hours as needed for wheezing or shortness of breath.    Dispense:  8 g    Refill:  0    I  spent approximately 5 minutes reviewing the patient's history, current symptoms and coordinating their care today.

## 2022-10-06 ENCOUNTER — Other Ambulatory Visit (HOSPITAL_COMMUNITY): Payer: Self-pay

## 2022-10-07 ENCOUNTER — Other Ambulatory Visit (HOSPITAL_COMMUNITY): Payer: Self-pay

## 2022-10-09 ENCOUNTER — Other Ambulatory Visit (HOSPITAL_COMMUNITY): Payer: Self-pay

## 2022-10-18 ENCOUNTER — Other Ambulatory Visit (HOSPITAL_COMMUNITY): Payer: Self-pay

## 2022-10-30 ENCOUNTER — Telehealth: Payer: 59

## 2022-10-30 DIAGNOSIS — G4733 Obstructive sleep apnea (adult) (pediatric): Secondary | ICD-10-CM | POA: Diagnosis not present

## 2022-10-30 DIAGNOSIS — J4540 Moderate persistent asthma, uncomplicated: Secondary | ICD-10-CM | POA: Diagnosis not present

## 2022-10-30 DIAGNOSIS — Z1331 Encounter for screening for depression: Secondary | ICD-10-CM | POA: Diagnosis not present

## 2022-11-02 ENCOUNTER — Other Ambulatory Visit (HOSPITAL_COMMUNITY): Payer: Self-pay

## 2022-11-02 ENCOUNTER — Ambulatory Visit (INDEPENDENT_AMBULATORY_CARE_PROVIDER_SITE_OTHER): Payer: 59 | Admitting: Adult Health

## 2022-11-02 ENCOUNTER — Encounter (INDEPENDENT_AMBULATORY_CARE_PROVIDER_SITE_OTHER): Payer: Self-pay | Admitting: Adult Health

## 2022-11-02 DIAGNOSIS — E669 Obesity, unspecified: Secondary | ICD-10-CM | POA: Diagnosis not present

## 2022-11-02 DIAGNOSIS — E559 Vitamin D deficiency, unspecified: Secondary | ICD-10-CM

## 2022-11-02 DIAGNOSIS — Z6841 Body Mass Index (BMI) 40.0 and over, adult: Secondary | ICD-10-CM | POA: Diagnosis not present

## 2022-11-02 DIAGNOSIS — E88819 Insulin resistance, unspecified: Secondary | ICD-10-CM

## 2022-11-02 MED ORDER — SEMAGLUTIDE-WEIGHT MANAGEMENT 1 MG/0.5ML ~~LOC~~ SOAJ
1.0000 mg | SUBCUTANEOUS | 0 refills | Status: DC
  Filled 2022-11-02: qty 2, 28d supply, fill #0

## 2022-11-02 MED ORDER — VITAMIN D (ERGOCALCIFEROL) 1.25 MG (50000 UNIT) PO CAPS
50000.0000 [IU] | ORAL_CAPSULE | ORAL | 0 refills | Status: DC
  Filled 2022-11-02: qty 4, 28d supply, fill #0

## 2022-11-02 NOTE — Progress Notes (Signed)
WEIGHT SUMMARY AND BIOMETRICS  Vitals Temp: 98.1 F (36.7 C) BP: 133/83 Pulse Rate: 75 SpO2: 96 %   Anthropometric Measurements Height: 5\' 2"  (1.575 m) Weight: 251 lb (113.9 kg) BMI (Calculated): 45.9 Weight at Last Visit: 254lb Weight Lost Since Last Visit: 3lb Weight Gained Since Last Visit: 0 Starting Weight: 244lb Total Weight Loss (lbs): 0 lb (0 kg) Peak Weight: 273lb   Body Composition  Body Fat %: 51.9 % Fat Mass (lbs): 130.4 lbs Muscle Mass (lbs): 114.8 lbs Total Body Water (lbs): 84.2 lbs Visceral Fat Rating : 16   Other Clinical Data Fasting: yes Labs: no Today's Visit #: 26 Starting Date: 09/15/20    Chief Complaint:   OBESITY Debra Horn is here to discuss her progress with her obesity treatment plan. She is on the the Category 2 Plan and states she is following her eating plan approximately 75 % of the time. She states she is not currently exercising.   Interim History:  She has titrated up to weekly Wegovy 0.5mg  - injects Friday evenings  Exercise-none Hydration-she estimates to drink at least 40 oz water/day  She has been consuming Sprite 20 oz- nutrition facts 230-240 calories 0 grams of total fat 110 milligrams of sodium 63 grams of total carbohydrates 63 grams of total sugars, all of which are added sugars  Subjective:   1. Insulin resistance  Latest Reference Range & Units 09/21/21 07:53 03/06/22 13:08 08/29/22 07:49  INSULIN 2.6 - 24.9 uIU/mL 37.1 (H) 20.2 9.3  (H): Data is abnormally high  Ms. Blanken was started on loading dose Wegovy 0.25mg  on/about 08/29/2022  09/26/2022 Wegovy increased to 0.5mg  Denies mass in neck, dysphagia, dyspepsia, persistent hoarseness, abdominal pain, or N/V She reports decreased BMs Prior to GLP-1 therapy she would have 1-3 BM/day, now daily BM or every other day. She denies abdominal pain or hematochezia  2. Vitamin D deficiency  Latest Reference Range & Units 09/21/21 07:53 03/06/22 13:08  08/29/22 07:49  Vitamin D, 25-Hydroxy 30.0 - 100.0 ng/mL 25.2 (L) 36.9 28.5 (L)  (L): Data is abnormally low  She is on weekly Ergocalciferol- denies N/V/Muscle Weakness  Assessment/Plan:   1. Insulin resistance Continue to increase protein at meals and snacks  2. Vitamin D deficiency Refill - Vitamin D, Ergocalciferol, (DRISDOL) 1.25 MG (50000 UNIT) CAPS capsule; Take 1 capsule (50,000 Units total) by mouth once a week.  Dispense: 4 capsule; Refill: 0  3. Obesity, current BMI 45.9 Refill and increase  Semaglutide-Weight Management 1 MG/0.5ML SOAJ Inject 1 mg into the skin once a week. Dispense: 2 mL, Refills: 0 ordered   Kanae is currently in the action stage of change. As such, her goal is to continue with weight loss efforts. She has agreed to the Category 2 Plan.   Exercise goals: All adults should avoid inactivity. Some physical activity is better than none, and adults who participate in any amount of physical activity gain some health benefits. Adults should also include muscle-strengthening activities that involve all major muscle groups on 2 or more days a week.  Behavioral modification strategies: increasing lean protein intake, decreasing simple carbohydrates, increasing vegetables, increasing water intake, decreasing eating out, no skipping meals, meal planning and cooking strategies, keeping healthy foods in the home, and planning for success.  Trease has agreed to follow-up with our clinic in 4 weeks. She was informed of the importance of frequent follow-up visits to maximize her success with intensive lifestyle modifications for her multiple health conditions.  Objective:   Blood pressure 133/83, pulse 75, temperature 98.1 F (36.7 C), height 5\' 2"  (1.575 m), weight 251 lb (113.9 kg), last menstrual period 09/26/2022, SpO2 96%. Body mass index is 45.91 kg/m.  General: Cooperative, alert, well developed, in no acute distress. HEENT: Conjunctivae and lids  unremarkable. Cardiovascular: Regular rhythm.  Lungs: Normal work of breathing. Neurologic: No focal deficits.   Lab Results  Component Value Date   CREATININE 0.66 08/29/2022   BUN 8 08/29/2022   NA 138 08/29/2022   K 4.1 08/29/2022   CL 101 08/29/2022   CO2 23 08/29/2022   Lab Results  Component Value Date   ALT 19 08/29/2022   AST 17 08/29/2022   ALKPHOS 117 08/29/2022   BILITOT 0.3 08/29/2022   Lab Results  Component Value Date   HGBA1C 5.2 08/29/2022   HGBA1C 5.5 03/06/2022   HGBA1C 5.6 09/21/2021   HGBA1C 5.6 02/04/2021   HGBA1C 5.4 09/16/2020   Lab Results  Component Value Date   INSULIN 9.3 08/29/2022   INSULIN 20.2 03/06/2022   INSULIN 37.1 (H) 09/21/2021   INSULIN 17.6 02/04/2021   INSULIN 16.1 09/16/2020   Lab Results  Component Value Date   TSH 1.68 08/18/2021   Lab Results  Component Value Date   CHOL 155 09/16/2020   HDL 46 09/16/2020   LDLCALC 97 09/16/2020   TRIG 62 09/16/2020   Lab Results  Component Value Date   VD25OH 28.5 (L) 08/29/2022   VD25OH 36.9 03/06/2022   VD25OH 25.2 (L) 09/21/2021   Lab Results  Component Value Date   WBC 7.2 05/25/2022   HGB 10.0 (L) 05/25/2022   HCT 32.5 (L) 05/25/2022   MCV 82.3 05/25/2022   PLT 502 (H) 05/25/2022   Lab Results  Component Value Date   IRON 11 (L) 05/25/2022   TIBC 475 (H) 05/25/2022   FERRITIN 8 (L) 05/25/2022    Attestation Statements:   Reviewed by clinician on day of visit: allergies, medications, problem list, medical history, surgical history, family history, social history, and previous encounter notes.  I have reviewed the above documentation for accuracy and completeness, and I agree with the above. -  Kobi Aller d. Johnta Couts, NP-C

## 2022-11-08 ENCOUNTER — Ambulatory Visit: Payer: 59 | Admitting: Gastroenterology

## 2022-11-13 ENCOUNTER — Other Ambulatory Visit (HOSPITAL_COMMUNITY): Payer: Self-pay

## 2022-11-14 ENCOUNTER — Other Ambulatory Visit (HOSPITAL_COMMUNITY): Payer: Self-pay

## 2022-11-17 ENCOUNTER — Other Ambulatory Visit (HOSPITAL_COMMUNITY): Payer: Self-pay

## 2022-11-24 ENCOUNTER — Encounter (HOSPITAL_COMMUNITY): Payer: Self-pay | Admitting: Emergency Medicine

## 2022-11-24 ENCOUNTER — Other Ambulatory Visit: Payer: Self-pay

## 2022-11-24 ENCOUNTER — Emergency Department (HOSPITAL_COMMUNITY)
Admission: EM | Admit: 2022-11-24 | Discharge: 2022-11-24 | Disposition: A | Discharge status: Home / Self Care | Payer: 59 | Attending: Emergency Medicine | Admitting: Emergency Medicine

## 2022-11-24 DIAGNOSIS — J45909 Unspecified asthma, uncomplicated: Secondary | ICD-10-CM | POA: Insufficient documentation

## 2022-11-24 DIAGNOSIS — T7840XA Allergy, unspecified, initial encounter: Secondary | ICD-10-CM | POA: Diagnosis not present

## 2022-11-24 DIAGNOSIS — R070 Pain in throat: Secondary | ICD-10-CM | POA: Diagnosis present

## 2022-11-24 LAB — CBC WITH DIFFERENTIAL/PLATELET
Abs Immature Granulocytes: 0.02 10*3/uL (ref 0.00–0.07)
Basophils Absolute: 0 10*3/uL (ref 0.0–0.1)
Basophils Relative: 0 %
Eosinophils Absolute: 0.2 10*3/uL (ref 0.0–0.5)
Eosinophils Relative: 2 %
HCT: 38 % (ref 36.0–46.0)
Hemoglobin: 12.6 g/dL (ref 12.0–15.0)
Immature Granulocytes: 0 %
Lymphocytes Relative: 47 %
Lymphs Abs: 3.6 10*3/uL (ref 0.7–4.0)
MCH: 31.2 pg (ref 26.0–34.0)
MCHC: 33.2 g/dL (ref 30.0–36.0)
MCV: 94.1 fL (ref 80.0–100.0)
Monocytes Absolute: 0.7 10*3/uL (ref 0.1–1.0)
Monocytes Relative: 8 %
Neutro Abs: 3.4 10*3/uL (ref 1.7–7.7)
Neutrophils Relative %: 43 %
Platelets: 324 10*3/uL (ref 150–400)
RBC: 4.04 MIL/uL (ref 3.87–5.11)
RDW: 13.3 % (ref 11.5–15.5)
WBC: 7.9 10*3/uL (ref 4.0–10.5)
nRBC: 0 % (ref 0.0–0.2)

## 2022-11-24 LAB — COMPREHENSIVE METABOLIC PANEL
ALT: 23 U/L (ref 0–44)
AST: 25 U/L (ref 15–41)
Albumin: 3.4 g/dL — ABNORMAL LOW (ref 3.5–5.0)
Alkaline Phosphatase: 83 U/L (ref 38–126)
Anion gap: 11 (ref 5–15)
BUN: 13 mg/dL (ref 6–20)
CO2: 24 mmol/L (ref 22–32)
Calcium: 9.5 mg/dL (ref 8.9–10.3)
Chloride: 104 mmol/L (ref 98–111)
Creatinine, Ser: 0.53 mg/dL (ref 0.44–1.00)
GFR, Estimated: >60 mL/min (ref >60)
Glucose, Bld: 85 mg/dL (ref 70–99)
Potassium: 3.9 mmol/L (ref 3.5–5.1)
Sodium: 139 mmol/L (ref 135–145)
Total Bilirubin: 0.9 mg/dL (ref <1.2)
Total Protein: 7.3 g/dL (ref 6.5–8.1)

## 2022-11-24 MED ORDER — EPINEPHRINE 0.3 MG/0.3ML IJ SOAJ: 0.3000 mg | INTRAMUSCULAR | 0 refills | Status: DC | PRN

## 2022-11-24 MED ORDER — SODIUM CHLORIDE 0.9 % IV BOLUS
500.0000 mL | Freq: Once | INTRAVENOUS | Status: AC
  Administered 2022-11-24: 500 mL via INTRAVENOUS

## 2022-11-24 MED ORDER — FAMOTIDINE IN NACL 20-0.9 MG/50ML-% IV SOLN
20.0000 mg | Freq: Once | INTRAVENOUS | Status: AC
  Administered 2022-11-24: 20 mg via INTRAVENOUS
  Filled 2022-11-24: qty 50

## 2022-11-24 MED ORDER — METHYLPREDNISOLONE SODIUM SUCC 125 MG IJ SOLR
125.0000 mg | Freq: Once | INTRAMUSCULAR | Status: AC
  Administered 2022-11-24: 125 mg via INTRAVENOUS
  Filled 2022-11-24: qty 2

## 2022-11-24 MED ORDER — EPINEPHRINE 0.3 MG/0.3ML IJ SOAJ
0.3000 mg | Freq: Once | INTRAMUSCULAR | Status: AC
  Administered 2022-11-24: 0.3 mg via INTRAMUSCULAR
  Filled 2022-11-24: qty 0.3

## 2022-11-24 NOTE — ED Notes (Signed)
Pt ambulated to bathroom

## 2022-11-24 NOTE — ED Triage Notes (Addendum)
Pt reports she ate a cake pop and then felt like her lips were tingling & her throat feels swollen. Denies known allergies. Pt reports she did take 2 benadryl w/ no relief.

## 2022-11-24 NOTE — Discharge Instructions (Signed)
You have been seen in the Emergency Department (ED) today for an allergic reaction.  You have been stable throughout your stay in the Emergency Department.  Please take your medications as prescribed and follow up with your doctor as indicated.  You should also take over-the-counter Benadryl around the clock for the next three days according to the dosing instructions on the package.  Please keep your Epi-Pen with you at all times and use it if experience shortness of breath or difficulty breathing or if you believe you are having a severe allergic reaction.  If you use the Epi-Pen, though, please call 911 afterwards or go immediately to your nearest Emergency Department.  Return to the Emergency Department (ED) if you experience any worsening or new symptoms that concern you.

## 2022-11-24 NOTE — ED Provider Notes (Signed)
Emergency Department Provider Note   I have reviewed the triage vital signs and the nursing notes.   HISTORY  Chief Complaint Allergic Reaction   HPI Debra Horn is a 37 y.o. female with past history reviewed below presents emergency department with acute onset itching in her mouth and a sensation of throat tightness.  She states that symptoms began shortly after eating a cake pop.  She tells me that she does this almost daily without issue.  Nothing was particularly different about this cake pop.  No new ingredients that she knows of.  Shortly after eating it she developed a golf ball sensation in her throat and tingling in her lips and mouth.  No rash.  No vomiting or diarrhea.  No chest pain or shortness of breath.  She took 50 mg of Benadryl prior to ED arrival with no improvement in symptoms.    Past Medical History:  Diagnosis Date   Anemia    low iron   Asthma    Complication of anesthesia    bp dropped in middle of last c-section and started with n/v   GERD (gastroesophageal reflux disease)    during pregnancy   Headache    "couple days/week" (10/28/2015)   Hyperthyroidism    not on medication   PE (pulmonary embolism) 11/2011   PONV (postoperative nausea and vomiting)    Pregnancy induced hypertension    Preterm labor    Sleep apnea    SOB (shortness of breath)    Thyroid condition    Vitamin D deficiency     Review of Systems  Constitutional: No fever/chills ENT: No sore throat. Cardiovascular: Denies chest pain. Respiratory: Denies shortness of breath. Gastrointestinal: No abdominal pain.  No nausea, no vomiting.  No diarrhea.  No constipation. Genitourinary: Negative for dysuria. Musculoskeletal: Negative for back pain. Skin: Negative for rash. Neurological: Negative for headaches, focal weakness or numbness.   ____________________________________________   PHYSICAL EXAM:  VITAL SIGNS: ED Triage Vitals  Encounter Vitals Group     BP  11/24/22 0353 (!) 152/89     Pulse Rate 11/24/22 0353 72     Resp 11/24/22 0353 18     Temp 11/24/22 0353 98.3 F (36.8 C)     Temp Source 11/24/22 0353 Oral     SpO2 11/24/22 0353 100 %     Weight 11/24/22 0354 253 lb (114.8 kg)     Height 11/24/22 0354 5\' 2"  (1.575 m)   Constitutional: Alert and oriented. Well appearing and in no acute distress. Eyes: Conjunctivae are normal.  Head: Atraumatic. Nose: No congestion/rhinnorhea. Mouth/Throat: Mucous membranes are moist.  Oropharynx non-erythematous. No visible angioedema.  Neck: No stridor.   Cardiovascular: Normal rate, regular rhythm. Good peripheral circulation. Grossly normal heart sounds.   Respiratory: Normal respiratory effort.  No retractions. Lungs CTAB. Gastrointestinal: Soft and nontender. No distention.  Musculoskeletal: No gross deformities of extremities. Neurologic:  Normal speech and language.  Skin:  Skin is warm, dry and intact. No rash noted.  ____________________________________________   LABS (all labs ordered are listed, but only abnormal results are displayed)  Labs Reviewed  COMPREHENSIVE METABOLIC PANEL - Abnormal; Notable for the following components:      Result Value   Albumin 3.4 (*)    All other components within normal limits  CBC WITH DIFFERENTIAL/PLATELET   ____________________________________________   PROCEDURES  Procedure(s) performed:   Procedures  CRITICAL CARE Performed by: Maia Plan Total critical care time: 35 minutes Critical care  time was exclusive of separately billable procedures and treating other patients. Critical care was necessary to treat or prevent imminent or life-threatening deterioration. Critical care was time spent personally by me on the following activities: development of treatment plan with patient and/or surrogate as well as nursing, discussions with consultants, evaluation of patient's response to treatment, examination of patient, obtaining history  from patient or surrogate, ordering and performing treatments and interventions, ordering and review of laboratory studies, ordering and review of radiographic studies, pulse oximetry and re-evaluation of patient's condition.  Alona Bene, MD Emergency Medicine  ____________________________________________   INITIAL IMPRESSION / ASSESSMENT AND PLAN / ED COURSE  Pertinent labs & imaging results that were available during my care of the patient were reviewed by me and considered in my medical decision making (see chart for details).   This patient is Presenting for Evaluation of sore throat, which does require a range of treatment options, and is a complaint that involves a high risk of morbidity and mortality.  The Differential Diagnoses include angioedema, anaphylaxis, contact dermatitis, globus sensation, etc.  Critical Interventions-    Medications  EPINEPHrine (EPI-PEN) injection 0.3 mg (0.3 mg Intramuscular Given 11/24/22 0413)  sodium chloride 0.9 % bolus 500 mL (0 mLs Intravenous Stopped 11/24/22 0633)  famotidine (PEPCID) IVPB 20 mg premix (0 mg Intravenous Stopped 11/24/22 0509)  methylPREDNISolone sodium succinate (SOLU-MEDROL) 125 mg/2 mL injection 125 mg (125 mg Intravenous Given 11/24/22 0413)    Reassessment after intervention:  symptoms improved.   Clinical Laboratory Tests Ordered, included CBC without leuk ptosis or anemia.  CMP with normal LFTs and kidney function.  Cardiac Monitor Tracing which shows NSR.    Social Determinants of Health Risk patient is a non-smoker.   Medical Decision Making: Summary:  Patient arrives with acute onset tingling in the mouth and sensation of throat swelling this taking Benadryl prior to arrival.  I do not see any visible angioedema.  Patient does not appear in distress but when her subjective symptoms and acute onset we will move forward with epinephrine, steroid, Pepcid and ED monitoring.   Reevaluation with update and  discussion with patient. She is feeling much better. Symptoms resolved after EpiPen and monitoring in the ED. Will d/c home with instructions to avoid the food thought to have caused symptoms, EpiPen Rx, and strict ED return precautions.   Patient's presentation is most consistent with acute presentation with potential threat to life or bodily function.   Disposition: discharge  ____________________________________________  FINAL CLINICAL IMPRESSION(S) / ED DIAGNOSES  Final diagnoses:  Allergic reaction, initial encounter     NEW OUTPATIENT MEDICATIONS STARTED DURING THIS VISIT:  New Prescriptions   EPINEPHRINE 0.3 MG/0.3 ML IJ SOAJ INJECTION    Inject 0.3 mg into the muscle as needed for anaphylaxis.    Note:  This document was prepared using Dragon voice recognition software and may include unintentional dictation errors.  Alona Bene, MD, [facility] Emergency Medicine    Mavryk Pino, Arlyss Repress, MD 11/24/22 [phone]

## 2022-11-24 NOTE — ED Notes (Signed)
Pt ambulated to bathroom 

## 2022-11-29 ENCOUNTER — Ambulatory Visit (INDEPENDENT_AMBULATORY_CARE_PROVIDER_SITE_OTHER): Payer: 59 | Admitting: Adult Health

## 2022-12-07 ENCOUNTER — Emergency Department (HOSPITAL_COMMUNITY)
Admission: EM | Admit: 2022-12-07 | Discharge: 2022-12-07 | Disposition: A | Discharge status: Home / Self Care | Payer: 59 | Attending: Emergency Medicine | Admitting: Emergency Medicine

## 2022-12-07 ENCOUNTER — Encounter (HOSPITAL_COMMUNITY): Payer: Self-pay | Admitting: Emergency Medicine

## 2022-12-07 DIAGNOSIS — Z7984 Long term (current) use of oral hypoglycemic drugs: Secondary | ICD-10-CM | POA: Diagnosis not present

## 2022-12-07 DIAGNOSIS — J45909 Unspecified asthma, uncomplicated: Secondary | ICD-10-CM | POA: Insufficient documentation

## 2022-12-07 DIAGNOSIS — T782XXA Anaphylactic shock, unspecified, initial encounter: Secondary | ICD-10-CM | POA: Insufficient documentation

## 2022-12-07 DIAGNOSIS — Z79899 Other long term (current) drug therapy: Secondary | ICD-10-CM | POA: Insufficient documentation

## 2022-12-07 DIAGNOSIS — Z7982 Long term (current) use of aspirin: Secondary | ICD-10-CM | POA: Insufficient documentation

## 2022-12-07 DIAGNOSIS — Z7951 Long term (current) use of inhaled steroids: Secondary | ICD-10-CM | POA: Insufficient documentation

## 2022-12-07 DIAGNOSIS — R9431 Abnormal electrocardiogram [ECG] [EKG]: Secondary | ICD-10-CM | POA: Diagnosis not present

## 2022-12-07 LAB — CBC WITH DIFFERENTIAL/PLATELET
Abs Immature Granulocytes: 0.02 10*3/uL (ref 0.00–0.07)
Basophils Absolute: 0 10*3/uL (ref 0.0–0.1)
Basophils Relative: 0 %
Eosinophils Absolute: 0.3 10*3/uL (ref 0.0–0.5)
Eosinophils Relative: 3 %
HCT: 39 % (ref 36.0–46.0)
Hemoglobin: 12.8 g/dL (ref 12.0–15.0)
Immature Granulocytes: 0 %
Lymphocytes Relative: 41 %
Lymphs Abs: 3.9 10*3/uL (ref 0.7–4.0)
MCH: 30.7 pg (ref 26.0–34.0)
MCHC: 32.8 g/dL (ref 30.0–36.0)
MCV: 93.5 fL (ref 80.0–100.0)
Monocytes Absolute: 0.9 10*3/uL (ref 0.1–1.0)
Monocytes Relative: 9 %
Neutro Abs: 4.5 10*3/uL (ref 1.7–7.7)
Neutrophils Relative %: 47 %
Platelets: 372 10*3/uL (ref 150–400)
RBC: 4.17 MIL/uL (ref 3.87–5.11)
RDW: 13.2 % (ref 11.5–15.5)
WBC: 9.6 10*3/uL (ref 4.0–10.5)
nRBC: 0 % (ref 0.0–0.2)

## 2022-12-07 LAB — COMPREHENSIVE METABOLIC PANEL
ALT: 30 U/L (ref 0–44)
AST: 22 U/L (ref 15–41)
Albumin: 3.6 g/dL (ref 3.5–5.0)
Alkaline Phosphatase: 98 U/L (ref 38–126)
Anion gap: 9 (ref 5–15)
BUN: 12 mg/dL (ref 6–20)
CO2: 25 mmol/L (ref 22–32)
Calcium: 9.3 mg/dL (ref 8.9–10.3)
Chloride: 105 mmol/L (ref 98–111)
Creatinine, Ser: 0.74 mg/dL (ref 0.44–1.00)
GFR, Estimated: >60 mL/min (ref >60)
Glucose, Bld: 111 mg/dL — ABNORMAL HIGH (ref 70–99)
Potassium: 3.3 mmol/L — ABNORMAL LOW (ref 3.5–5.1)
Sodium: 139 mmol/L (ref 135–145)
Total Bilirubin: 0.5 mg/dL (ref <1.2)
Total Protein: 7.4 g/dL (ref 6.5–8.1)

## 2022-12-07 LAB — HCG, SERUM, QUALITATIVE: Preg, Serum: NEGATIVE

## 2022-12-07 MED ORDER — FAMOTIDINE IN NACL 20-0.9 MG/50ML-% IV SOLN
20.0000 mg | Freq: Once | INTRAVENOUS | Status: AC
  Administered 2022-12-07: 20 mg via INTRAVENOUS
  Filled 2022-12-07: qty 50

## 2022-12-07 MED ORDER — PREDNISONE 50 MG PO TABS: ORAL_TABLET | ORAL | 0 refills | Status: DC

## 2022-12-07 MED ORDER — FAMOTIDINE 20 MG PO TABS: 20.0000 mg | ORAL_TABLET | Freq: Every day | ORAL | 0 refills | Status: DC

## 2022-12-07 MED ORDER — METHYLPREDNISOLONE SODIUM SUCC 125 MG IJ SOLR
125.0000 mg | Freq: Once | INTRAMUSCULAR | Status: AC
  Administered 2022-12-07: 125 mg via INTRAVENOUS
  Filled 2022-12-07: qty 2

## 2022-12-07 MED ORDER — DIPHENHYDRAMINE HCL 50 MG/ML IJ SOLN
25.0000 mg | Freq: Once | INTRAMUSCULAR | Status: AC
  Administered 2022-12-07: 25 mg via INTRAVENOUS
  Filled 2022-12-07: qty 1

## 2022-12-07 MED ORDER — EPINEPHRINE 0.3 MG/0.3ML IJ SOAJ
0.3000 mg | Freq: Once | INTRAMUSCULAR | Status: AC
  Administered 2022-12-07: 0.3 mg via INTRAMUSCULAR
  Filled 2022-12-07: qty 0.3

## 2022-12-07 NOTE — Discharge Instructions (Signed)
Take the steroids and antihistamines as prescribed.  Avoid going to [employer] in the future.  Use the epinephrine pen only for difficulty breathing, difficulty swallowing, chest pain, shortness of breath, tongue or lip swelling.  If use the epinephrine pen, you must come to the hospital afterwards to be observed.

## 2022-12-07 NOTE — ED Triage Notes (Signed)
Pt reports she ate something from [employer] tonight. She felt mouth and throat get tingling but continued eating. She reports she did not pick up her px for epi. Oral airway intact and no swelling noted.  No distress noted.

## 2022-12-07 NOTE — ED Provider Notes (Signed)
[facility] EMERGENCY DEPARTMENT AT [facility] Provider Note   CSN: [id number] Arrival date & time: 12/07/22  0145     History  Chief Complaint  Patient presents with   Allergic Reaction    Debra Horn is a 37 y.o. female.  Patient with GERD, asthma, remote history of PE not on anticoagulation presenting with concern for allergic reaction.  States she ate some cake pops from [employer] about 11 PM.  Shortly after she started having a full sensation in her throat and tingling of her mouth and lips.  This is similar to when she presented to the ED 2 weeks ago.  She feels tight in her throat with difficulty swallowing.  Denies chest pain or shortness of breath.  Denies rash.  She was prescribed epinephrine pen but did not use it tonight. Does not know what is causing her reaction. Denies any tongue swelling or lip swelling.  Denies any chest pain, shortness of breath, cough, fever, abdominal pain, nausea or vomiting.  No known allergies.  The history is provided by the patient.  Allergic Reaction Presenting symptoms: difficulty swallowing   Presenting symptoms: no rash        Home Medications Prior to Admission medications   Medication Sig Start Date End Date Taking? Authorizing Provider  albuterol (VENTOLIN HFA) 108 (90 Base) MCG/ACT inhaler Inhale 2 puffs into the lungs every 6 (six) hours as needed for wheezing or shortness of breath. 05/10/22   [name], FNP  albuterol (VENTOLIN HFA) 108 (90 Base) MCG/ACT inhaler Inhale 1-2 puffs into the lungs every 4 (four) hours as needed for wheezing or shortness of breath. 07/13/22   Freddy Finner, NP  albuterol (VENTOLIN HFA) 108 (90 Base) MCG/ACT inhaler Inhale 2 puffs into the lungs every 6 (six) hours as needed for wheezing or shortness of breath. 10/05/22   [name], FNP  aspirin-sod bicarb-citric acid (ALKA-SELTZER) 325 MG TBEF tablet Take 325 mg by mouth every 6 (six) hours as needed.    [provider]   EPINEPHrine 0.3 mg/0.3 mL IJ SOAJ injection Inject 0.3 mg into the muscle as needed for anaphylaxis. 11/24/22   Long, Arlyss Repress, MD  FeFum-FePoly-FA-B Cmp-C-Biot (INTEGRA PLUS) CAPS Take 1 capsule by mouth every morning. 05/25/22   Heilingoetter, Cassandra L, PA-C  FLUoxetine (PROZAC) 10 MG capsule Take 1 capsule (10 mg total) by mouth daily. 08/29/22   Danford, Orpha Bur D, NP  fluticasone (FLONASE) 50 MCG/ACT nasal spray SPRAY 2 SPRAYS INTO EACH NOSTRIL EVERY DAY Patient taking differently: Place 2 sprays into both nostrils daily. 12/13/20   Danford, Orpha Bur D, NP  fluticasone (FLOVENT HFA) 110 MCG/ACT inhaler Inhale 2 puffs into the lungs in the morning and at bedtime. 07/13/22   Freddy Finner, NP  folic acid (FOLVITE) 1 MG tablet Take 1 tablet (1 mg total) by mouth daily. 05/27/22   Heilingoetter, Cassandra L, PA-C  loratadine (CLARITIN) 10 MG tablet TAKE 1 TABLET BY MOUTH EVERY DAY 06/05/22   Brock Bad, MD  metFORMIN (GLUCOPHAGE-XR) 500 MG 24 hr tablet Take 1 tablet (500 mg total) by mouth daily with a full meal. 09/26/22   Danford, Orpha Bur D, NP  metroNIDAZOLE (METROGEL) 0.75 % vaginal gel PLACE 1 APPLICATORFUL VAGINALLY 2 (TWO) TIMES DAILY. USE MONTHLY AFTER THE PERIOD FOR 6 MONTHS 06/05/22   Brock Bad, MD  montelukast (SINGULAIR) 10 MG tablet Take 1 tablet (10 mg total) by mouth at bedtime. 07/13/22   Freddy Finner, NP  predniSONE (DELTASONE) 20 MG tablet Take 2 tablets (40 mg total) by mouth daily with breakfast. 09/22/22   Margaretann Loveless, PA-C  Semaglutide-Weight Management 1 MG/0.5ML SOAJ Inject 1 mg into the skin once a week. 11/02/22   Danford, Orpha Bur D, NP  Vitamin D, Ergocalciferol, (DRISDOL) 1.25 MG (50000 UNIT) CAPS capsule Take 1 capsule (50,000 Units total) by mouth once a week. 11/02/22   Danford, Orpha Bur D, NP      Allergies    Banana and Cefixime    Review of Systems   Review of Systems  Constitutional:  Negative for activity change, appetite change and fever.  HENT:   Positive for trouble swallowing. Negative for congestion and rhinorrhea.   Respiratory:  Negative for cough, chest tightness and shortness of breath.   Cardiovascular:  Negative for chest pain.  Gastrointestinal:  Negative for abdominal pain, nausea and vomiting.  Genitourinary:  Negative for dysuria.  Musculoskeletal:  Negative for arthralgias and myalgias.  Skin:  Negative for rash.  Neurological:  Negative for dizziness, weakness and headaches.   all other systems are negative except as noted in the HPI and PMH.    Physical Exam Updated Vital Signs BP 127/75   Pulse 77   Temp 98.3 F (36.8 C)   Resp 18   LMP  (Approximate)   SpO2 100%  Physical Exam Vitals and nursing note reviewed.  Constitutional:      General: She is not in acute distress.    Appearance: She is well-developed.  HENT:     Head: Normocephalic and atraumatic.     Mouth/Throat:     Pharynx: No oropharyngeal exudate.     Comments: Controlling secretions, no tongue swelling, no drooling Eyes:     Conjunctiva/sclera: Conjunctivae normal.     Pupils: Pupils are equal, round, and reactive to light.  Neck:     Comments: No meningismus. Cardiovascular:     Rate and Rhythm: Normal rate and regular rhythm.     Heart sounds: Normal heart sounds. No murmur heard. Pulmonary:     Effort: Pulmonary effort is normal. No respiratory distress.     Breath sounds: Normal breath sounds. No wheezing.  Abdominal:     Palpations: Abdomen is soft.     Tenderness: There is no abdominal tenderness. There is no guarding or rebound.  Musculoskeletal:        General: No tenderness. Normal range of motion.     Cervical back: Normal range of motion and neck supple.  Skin:    General: Skin is warm.     Findings: No rash.  Neurological:     Mental Status: She is alert and oriented to person, place, and time.     Cranial Nerves: No cranial nerve deficit.     Motor: No abnormal muscle tone.     Coordination: Coordination normal.      Comments:  5/5 strength throughout. CN 2-12 intact.Equal grip strength.   Psychiatric:        Behavior: Behavior normal.     ED Results / Procedures / Treatments   Labs (all labs ordered are listed, but only abnormal results are displayed) Labs Reviewed  COMPREHENSIVE METABOLIC PANEL - Abnormal; Notable for the following components:      Result Value   Potassium 3.3 (*)    Glucose, Bld 111 (*)    All other components within normal limits  CBC WITH DIFFERENTIAL/PLATELET  HCG, SERUM, QUALITATIVE    EKG EKG Interpretation Date/Time:  Thursday December 07 2022 02:44:52 EST Ventricular Rate:  82 PR Interval:  134 QRS Duration:  93 QT Interval:  379 QTC Calculation: 443 R Axis:   69  Text Interpretation: Sinus rhythm No significant change was found Confirmed by Glynn Octave [phone]) on 12/07/2022 6:31:50 AM  Radiology No results found.  Procedures .Critical Care  Performed by: Glynn Octave, MD Authorized by: Glynn Octave, MD   Critical care provider statement:    Critical care time (minutes):  35   Critical care time was exclusive of:  Separately billable procedures and treating other patients   Critical care was necessary to treat or prevent imminent or life-threatening deterioration of the following conditions: anaphylaxis.   Critical care was time spent personally by me on the following activities:  Development of treatment plan with patient or surrogate, discussions with consultants, evaluation of patient's response to treatment, examination of patient, ordering and review of laboratory studies, ordering and review of radiographic studies, ordering and performing treatments and interventions, pulse oximetry, re-evaluation of patient's condition, review of old charts, blood draw for specimens and obtaining history from patient or surrogate   I assumed direction of critical care for this patient from another provider in my specialty: no     Care discussed with:  admitting provider       Medications Ordered in ED Medications  methylPREDNISolone sodium succinate (SOLU-MEDROL) 125 mg/2 mL injection 125 mg (has no administration in time range)  famotidine (PEPCID) IVPB 20 mg premix (has no administration in time range)  diphenhydrAMINE (BENADRYL) injection 25 mg (has no administration in time range)  EPINEPHrine (EPI-PEN) injection 0.3 mg (0.3 mg Intramuscular Given 12/07/22 0230)    ED Course/ Medical Decision Making/ A&P                                 Medical Decision Making Amount and/or Complexity of Data Reviewed Labs: ordered. Decision-making details documented in ED Course. Radiology: ordered and independent interpretation performed. Decision-making details documented in ED Course. ECG/medicine tests: ordered and independent interpretation performed. Decision-making details documented in ED Course.  Risk Prescription drug management.   Concern for allergic reaction to food at [facility].  Complains of throat feeling tight as well as tongue and lips tingling.  She is controlling her secretions.  No chest pain or shortness of breath.  Given IM epinephrine, steroids, antihistamines given her perceived throat swelling and difficulty swallowing.   Monitored in the ED for 3 hours status post epinephrine injection with no deterioration in condition.  Throat feels back to normal.  No chest pain or shortness of breath.  Discussed avoiding the [employer] in the future.  She has epinephrine pen for home use.  Will give course of steroids and antihistamines.  Return to the ED with difficulty breathing, difficulty swallowing, tongue swelling, lip swelling, other concerns       Final Clinical Impression(s) / ED Diagnoses Final diagnoses:  Anaphylaxis, initial encounter    Rx / DC Orders ED Discharge Orders     None         Joslynn Jamroz, Jeannett Senior, MD 12/07/22 [phone]

## 2022-12-07 NOTE — ED Notes (Signed)
Nurse attempted to start a line on pt unable to find anything

## 2022-12-17 ENCOUNTER — Telehealth: Payer: 59 | Admitting: Family

## 2022-12-17 DIAGNOSIS — L739 Follicular disorder, unspecified: Secondary | ICD-10-CM | POA: Diagnosis not present

## 2022-12-17 MED ORDER — DOXYCYCLINE HYCLATE 100 MG PO TABS: 100.0000 mg | ORAL_TABLET | Freq: Two times a day (BID) | ORAL | 0 refills | Status: DC

## 2022-12-17 NOTE — Progress Notes (Signed)
E Visit for Rash  We are sorry that you are not feeling well. Here is how we plan to help!   Based upon what you have shared with me it looks like you have a bacterial follicultits.  Folliculitis is inflammation of the hair follicles that can be caused by a superficial infection of the skin and is treated with an antibiotic. I have prescribed: and Doxycycline 100 mg twice per day for 7 days     HOME CARE:  Take cool showers and avoid direct sunlight. Apply cool compress or wet dressings. Take a bath in an oatmeal bath.  Sprinkle content of one Aveeno packet under running faucet with comfortably warm water.  Bathe for 15-20 minutes, 1-2 times daily.  Pat dry with a towel. Do not rub the rash. Use hydrocortisone cream. Take an antihistamine like Benadryl for widespread rashes that itch.  The adult dose of Benadryl is 25-50 mg by mouth 4 times daily. Caution:  This type of medication may cause sleepiness.  Do not drink alcohol, drive, or operate dangerous machinery while taking antihistamines.  Do not take these medications if you have prostate enlargement.  Read package instructions thoroughly on all medications that you take.  GET HELP RIGHT AWAY IF:  Symptoms don't go away after treatment. Severe itching that persists. If you rash spreads or swells. If you rash begins to smell. If it blisters and opens or develops a yellow-brown crust. You develop a fever. You have a sore throat. You become short of breath.  MAKE SURE YOU:  Understand these instructions. Will watch your condition. Will get help right away if you are not doing well or get worse.  Thank you for choosing an e-visit.  Your e-visit answers were reviewed by a board certified advanced clinical practitioner to complete your personal care plan. Depending upon the condition, your plan could have included both over the counter or prescription medications.  Please review your pharmacy choice. Make sure the pharmacy is open  so you can pick up prescription now. If there is a problem, you may contact your provider through MyChart messaging and have the prescription routed to another pharmacy.  Your safety is important to us. If you have drug allergies check your prescription carefully.   For the next 24 hours you can use MyChart to ask questions about today's visit, request a non-urgent call back, or ask for a work or school excuse. You will get an email in the next two days asking about your experience. I hope that your e-visit has been valuable and will speed your recovery.  Approximately 5 minutes was spent documenting and reviewing patient's chart.  

## 2022-12-27 ENCOUNTER — Telehealth: Payer: 59 | Admitting: Family Medicine

## 2022-12-27 DIAGNOSIS — T7840XD Allergy, unspecified, subsequent encounter: Secondary | ICD-10-CM | POA: Diagnosis not present

## 2022-12-27 MED ORDER — PREDNISONE 50 MG PO TABS
ORAL_TABLET | ORAL | 0 refills | Status: DC
Start: 2022-12-27 — End: 2023-01-17

## 2022-12-27 NOTE — Progress Notes (Signed)
Virtual Visit Consent   Debra Horn, you are scheduled for a virtual visit with a [facility] provider today. Just as with appointments in the office, your consent must be obtained to participate. Your consent will be active for this visit and any virtual visit you may have with one of our providers in the next 365 days. If you have a MyChart account, a copy of this consent can be sent to you electronically.  As this is a virtual visit, video technology does not allow for your provider to perform a traditional examination. This may limit your provider's ability to fully assess your condition. If your provider identifies any concerns that need to be evaluated in person or the need to arrange testing (such as labs, EKG, etc.), we will make arrangements to do so. Although advances in technology are sophisticated, we cannot ensure that it will always work on either your end or our end. If the connection with a video visit is poor, the visit may have to be switched to a telephone visit. With either a video or telephone visit, we are not always able to ensure that we have a secure connection.  By engaging in this virtual visit, you consent to the provision of healthcare and authorize for your insurance to be billed (if applicable) for the services provided during this visit. Depending on your insurance coverage, you may receive a charge related to this service.  I need to obtain your verbal consent now. Are you willing to proceed with your visit today? Debra Horn has provided verbal consent on 12/27/2022 for a virtual visit (video or telephone). Reed Pandy, [place]  Date: 12/27/2022 12:54 PM  Virtual Visit via Video Note   I, Reed Pandy, connected with  Debra Horn  ([id number], 1935-04-30) on 12/27/22 at 12:45 PM EST by a video-enabled telemedicine application and verified that I am speaking with the correct person using two identifiers.  Location: Patient: Virtual Visit Location  Patient: Home Provider: Virtual Visit Location Provider: Home Office   I discussed the limitations of evaluation and management by telemedicine and the availability of in person appointments. The patient expressed understanding and agreed to proceed.    History of Present Illness: Debra Horn is a 37 y.o. who identifies as a female who was assigned female at birth, and is being seen today for a medication refill.  Pt states on 12/07/22 she had an allergic reaction and was prescribed prednisone, epipen for allergic reaction.  Pt states she had four prednisone pills left but when she opened the bottle the pills fell into the vent.  Pt states her allergic reaction she gets a knot in her throat. Pt states she feels her throat a little tight and feels like its hard to swallow. Pt states she is has an appointment with her PCP on January 8th or 9th.   HPI: HPI  Problems:  Patient Active Problem List   Diagnosis Date Noted   Iron deficiency anemia 05/25/2022   Shortness of breath 08/22/2021   Moderate persistent asthma with exacerbation 07/28/2021   Diffuse pain 05/31/2021   Allergic reaction 05/11/2021   Iron deficiency anemia due to chronic blood loss 03/07/2021   Low folate 03/07/2021   Class 3 severe obesity with serious comorbidity and body mass index (BMI) of 40.0 to 44.9 in adult (HCC) 03/03/2021   CRP elevated 12/01/2020   Elevated sed rate 12/01/2020   Nasal drainage 11/09/2020   Insulin resistance 10/26/2020   Other chest  pain 10/13/2020   Severe sleep apnea 06/24/2020   BMI 45.0-49.9, adult (HCC) 06/24/2020   Status post repeat low transverse cesarean section 07/27/2017   Poor fetal growth affecting management of mother in third trimester    Previous pregnancy complicated by pregnancy-induced hypertension in third trimester, antepartum    IUGR (intrauterine growth restriction) in prior pregnancy, pregnant 03/06/2017   Vitamin D deficiency 02/17/2017   History of preterm  delivery 02/09/2017   History of asthma    Normocytic anemia 02/06/2016   Family history of systemic lupus erythematosus (SLE) in mother 02/19/2014   Hyperthyroidism 02/19/2014   Previous cesarean section 08/08/2012   History of pulmonary embolus (PE) 07/24/2012    Allergies:  Allergies  Allergen Reactions   Banana Anaphylaxis   Cefixime Rash   Medications:  Current Outpatient Medications:    albuterol (VENTOLIN HFA) 108 (90 Base) MCG/ACT inhaler, Inhale 2 puffs into the lungs every 6 (six) hours as needed for wheezing or shortness of breath., Disp: 8 g, Rfl: 2   albuterol (VENTOLIN HFA) 108 (90 Base) MCG/ACT inhaler, Inhale 1-2 puffs into the lungs every 4 (four) hours as needed for wheezing or shortness of breath., Disp: 18 g, Rfl: 1   albuterol (VENTOLIN HFA) 108 (90 Base) MCG/ACT inhaler, Inhale 2 puffs into the lungs every 6 (six) hours as needed for wheezing or shortness of breath., Disp: 8 g, Rfl: 0   albuterol (VENTOLIN HFA) 108 (90 Base) MCG/ACT inhaler, Inhale 2 puffs into the lungs every 6 (six) hours as needed for wheezing or shortness of breath., Disp: , Rfl:    aspirin-sod bicarb-citric acid (ALKA-SELTZER) 325 MG TBEF tablet, Take 325 mg by mouth every 6 (six) hours as needed., Disp: , Rfl:    doxycycline (VIBRA-TABS) 100 MG tablet, Take 1 tablet (100 mg total) by mouth 2 (two) times daily., Disp: 14 tablet, Rfl: 0   EPINEPHrine 0.3 mg/0.3 mL IJ SOAJ injection, Inject 0.3 mg into the muscle as needed for anaphylaxis., Disp: 1 each, Rfl: 0   famotidine (PEPCID) 20 MG tablet, Take 1 tablet (20 mg total) by mouth daily., Disp: 30 tablet, Rfl: 0   FeFum-FePoly-FA-B Cmp-C-Biot (INTEGRA PLUS) CAPS, Take 1 capsule by mouth every morning., Disp: 30 capsule, Rfl: 2   FLUoxetine (PROZAC) 10 MG capsule, Take 1 capsule (10 mg total) by mouth daily., Disp: 90 capsule, Rfl: 0   fluticasone (FLONASE) 50 MCG/ACT nasal spray, SPRAY 2 SPRAYS INTO EACH NOSTRIL EVERY DAY (Patient taking  differently: Place 2 sprays into both nostrils daily.), Disp: 16 mL, Rfl: 0   fluticasone (FLOVENT HFA) 110 MCG/ACT inhaler, Inhale 2 puffs into the lungs in the morning and at bedtime. (Patient taking differently: Inhale 2 puffs into the lungs 2 (two) times daily as needed.), Disp: 1 each, Rfl: 1   folic acid (FOLVITE) 1 MG tablet, Take 1 tablet (1 mg total) by mouth daily., Disp: 90 tablet, Rfl: 1   loratadine (CLARITIN) 10 MG tablet, TAKE 1 TABLET BY MOUTH EVERY DAY (Patient not taking: Reported on 12/07/2022), Disp: 30 tablet, Rfl: 11   metFORMIN (GLUCOPHAGE-XR) 500 MG 24 hr tablet, Take 1 tablet (500 mg total) by mouth daily with a full meal., Disp: 30 tablet, Rfl: 0   metroNIDAZOLE (METROGEL) 0.75 % vaginal gel, PLACE 1 APPLICATORFUL VAGINALLY 2 (TWO) TIMES DAILY. USE MONTHLY AFTER THE PERIOD FOR 6 MONTHS, Disp: 70 g, Rfl: 5   montelukast (SINGULAIR) 10 MG tablet, Take 1 tablet (10 mg total) by mouth at bedtime. (Patient  not taking: Reported on 12/07/2022), Disp: 30 tablet, Rfl: 1   predniSONE (DELTASONE) 50 MG tablet, 1 tablet PO daily, Disp: 5 tablet, Rfl: 0   Semaglutide-Weight Management 1 MG/0.5ML SOAJ, Inject 1 mg into the skin once a week., Disp: 2 mL, Rfl: 0   Vitamin D, Ergocalciferol, (DRISDOL) 1.25 MG (50000 UNIT) CAPS capsule, Take 1 capsule (50,000 Units total) by mouth once a week., Disp: 4 capsule, Rfl: 0  Observations/Objective: Patient is well-developed, well-nourished in no acute distress.  Resting comfortably at home.  Head is normocephalic, atraumatic.  No labored breathing.  Speech is clear and coherent with logical content.  Patient is alert and oriented at baseline.    Assessment and Plan: 1. Allergic reaction, subsequent encounter (Primary) - predniSONE (DELTASONE) 50 MG tablet; 1 tablet PO daily  Dispense: 5 tablet; Refill: 0  -Advised Pt to use Epipen if difficulty with breathing or increased difficulty with swallowing occurs, also advised Pt to proceed urgently  to the emergency room if symptoms do not improve with medications -Pt verbalized understanding   Follow Up Instructions: I discussed the assessment and treatment plan with the patient. The patient was provided an opportunity to ask questions and all were answered. The patient agreed with the plan and demonstrated an understanding of the instructions.  A copy of instructions were sent to the patient via MyChart unless otherwise noted below.    The patient was advised to call back or seek an in-person evaluation if the symptoms worsen or if the condition fails to improve as anticipated.    Reed Pandy, PA-C

## 2022-12-27 NOTE — Patient Instructions (Signed)
Debra Horn, thank you for joining Reed Pandy, PA-C for today's virtual visit.  While this provider is not your primary care provider (PCP), if your PCP is located in our provider database this encounter information will be shared with them immediately following your visit.   A [facility] MyChart account gives you access to today's visit and all your visits, tests, and labs performed at [facility] " click here if you don't have a [facility] MyChart account or go to [email or web address]  Consent: (Patient) Debra Horn provided verbal consent for this virtual visit at the beginning of the encounter.  Current Medications:  Current Outpatient Medications:    albuterol (VENTOLIN HFA) 108 (90 Base) MCG/ACT inhaler, Inhale 2 puffs into the lungs every 6 (six) hours as needed for wheezing or shortness of breath., Disp: 8 g, Rfl: 2   albuterol (VENTOLIN HFA) 108 (90 Base) MCG/ACT inhaler, Inhale 1-2 puffs into the lungs every 4 (four) hours as needed for wheezing or shortness of breath., Disp: 18 g, Rfl: 1   albuterol (VENTOLIN HFA) 108 (90 Base) MCG/ACT inhaler, Inhale 2 puffs into the lungs every 6 (six) hours as needed for wheezing or shortness of breath., Disp: 8 g, Rfl: 0   albuterol (VENTOLIN HFA) 108 (90 Base) MCG/ACT inhaler, Inhale 2 puffs into the lungs every 6 (six) hours as needed for wheezing or shortness of breath., Disp: , Rfl:    aspirin-sod bicarb-citric acid (ALKA-SELTZER) 325 MG TBEF tablet, Take 325 mg by mouth every 6 (six) hours as needed., Disp: , Rfl:    doxycycline (VIBRA-TABS) 100 MG tablet, Take 1 tablet (100 mg total) by mouth 2 (two) times daily., Disp: 14 tablet, Rfl: 0   EPINEPHrine 0.3 mg/0.3 mL IJ SOAJ injection, Inject 0.3 mg into the muscle as needed for anaphylaxis., Disp: 1 each, Rfl: 0   famotidine (PEPCID) 20 MG tablet, Take 1 tablet (20 mg total) by mouth daily., Disp: 30 tablet, Rfl: 0   FeFum-FePoly-FA-B Cmp-C-Biot (INTEGRA PLUS)  CAPS, Take 1 capsule by mouth every morning., Disp: 30 capsule, Rfl: 2   FLUoxetine (PROZAC) 10 MG capsule, Take 1 capsule (10 mg total) by mouth daily., Disp: 90 capsule, Rfl: 0   fluticasone (FLONASE) 50 MCG/ACT nasal spray, SPRAY 2 SPRAYS INTO EACH NOSTRIL EVERY DAY (Patient taking differently: Place 2 sprays into both nostrils daily.), Disp: 16 mL, Rfl: 0   fluticasone (FLOVENT HFA) 110 MCG/ACT inhaler, Inhale 2 puffs into the lungs in the morning and at bedtime. (Patient taking differently: Inhale 2 puffs into the lungs 2 (two) times daily as needed.), Disp: 1 each, Rfl: 1   folic acid (FOLVITE) 1 MG tablet, Take 1 tablet (1 mg total) by mouth daily., Disp: 90 tablet, Rfl: 1   loratadine (CLARITIN) 10 MG tablet, TAKE 1 TABLET BY MOUTH EVERY DAY (Patient not taking: Reported on 12/07/2022), Disp: 30 tablet, Rfl: 11   metFORMIN (GLUCOPHAGE-XR) 500 MG 24 hr tablet, Take 1 tablet (500 mg total) by mouth daily with a full meal., Disp: 30 tablet, Rfl: 0   metroNIDAZOLE (METROGEL) 0.75 % vaginal gel, PLACE 1 APPLICATORFUL VAGINALLY 2 (TWO) TIMES DAILY. USE MONTHLY AFTER THE PERIOD FOR 6 MONTHS, Disp: 70 g, Rfl: 5   montelukast (SINGULAIR) 10 MG tablet, Take 1 tablet (10 mg total) by mouth at bedtime. (Patient not taking: Reported on 12/07/2022), Disp: 30 tablet, Rfl: 1   predniSONE (DELTASONE) 50 MG tablet, 1 tablet PO daily, Disp: 5 tablet, Rfl: 0   Semaglutide-Weight  Management 1 MG/0.5ML SOAJ, Inject 1 mg into the skin once a week., Disp: 2 mL, Rfl: 0   Vitamin D, Ergocalciferol, (DRISDOL) 1.25 MG (50000 UNIT) CAPS capsule, Take 1 capsule (50,000 Units total) by mouth once a week., Disp: 4 capsule, Rfl: 0   Medications ordered in this encounter:  Meds ordered this encounter  Medications   predniSONE (DELTASONE) 50 MG tablet    Sig: 1 tablet PO daily    Dispense:  5 tablet    Refill:  0     *If you need refills on other medications prior to your next appointment, please contact your  pharmacy*  Follow-Up: Call back or seek an in-person evaluation if the symptoms worsen or if the condition fails to improve as anticipated.  [facility] [phone]  Other Instructions Anaphylactic Reaction, Adult An anaphylactic reaction is a sudden and very bad allergic reaction. It must be treated right away. What are the causes? Being exposed to things you are allergic to (allergens). These may be: Foods, like peanuts, wheat, shellfish, milk, or eggs. Medicines. Insect bites or stings. Blood or parts of blood that have been given to you for treatment (transfusions). Chemicals. These include latex and dyes used in food and medical tests. What increases the risk? Having allergies. Having had this kind of reaction before. Having a family history of this kind of reaction. Having asthma or eczema. What are the signs or symptoms? Feeling warm in the face (flushed). Your face may turn red. Hives. These are itchy, red, swollen areas of skin. Swelling of the eyes, lips, face, mouth, tongue, or throat. Trouble breathing or speaking. Trouble swallowing. Feeling dizzy or fainting. Pain or cramps in your belly (abdomen). Vomiting or watery poop (diarrhea). How is this treated? If you are having a bad allergic reaction, you should: Give yourself a shot using a device filled with epinephrine (auto-injector pen). Your doctor will teach you how to use the pen. Call for help. If you use an auto-injector pen, you must still get treated in the hospital. You may be given: Medicines. Oxygen. Fluids in an IV tube. Follow these instructions at home: Safety Always keep an auto-injector pen with you. If you can, carry two pens. Use the pen as told by your doctor. After you use the pen, get more medicine for it right away. Do not drive after you have a reaction. Wait until your doctor says it is safe to drive. Make sure that you, the people who live with you, and your employer  know: What you are allergic to. How to use your auto-injector pen. If told by your doctor, wear a bracelet or necklace that says you have an allergy. Learn the signs of a very bad allergic reaction. Work with your doctors to make a plan for what to do if you have a very bad reaction. General instructions Take over-the-counter and prescription medicines only as told by your doctor. If you have a rash or itchy skin: Use an allergy medicine as told by your doctor. Put cold, wet cloths on your skin. Take a cool bath or shower. Do not use hot water. Tell all of your doctors that you have an allergy. How is this prevented? Avoid things that have given you a bad reaction before. Tell your server about your allergy when you go out to eat. If you are not sure if your meal contains food that you are allergic to, ask your server before you eat it. Where to find  more information American Academy of Allergy, Asthma, and Immunology (AAAAI): aaaai.org Contact a doctor if: Your auto-injector pen is expired. Get help right away if: You have a bad allergic reaction. You use your auto-injector pen. You must go to the hospital even if the medicine seems to be working. These symptoms may be an emergency. Use the auto-injector pen right away. Then call 911. Do not wait to see if the symptoms will go away. Do not drive yourself to the hospital. This information is not intended to replace advice given to you by your health care provider. Make sure you discuss any questions you have with your health care provider. Document Revised: 09/02/2021 Document Reviewed: 09/02/2021 Elsevier Patient Education  2024 Elsevier Inc.    If you have been instructed to have an in-person evaluation today at a local Urgent Care facility, please use the link below. It will take you to a list of all of our available [facility], including address, phone number and hours of operation. Please do not delay care.  [facility]  If you or a family member do not have a primary care provider, use the link below to schedule a visit and establish care. When you choose a [facility] primary care physician or advanced practice provider, you gain a long-term partner in health. Find a Primary Care Provider  Learn more about [facility]'s in-office and virtual care options: [facility] - Get Care Now

## 2023-01-01 ENCOUNTER — Encounter (INDEPENDENT_AMBULATORY_CARE_PROVIDER_SITE_OTHER): Payer: Self-pay | Admitting: Adult Health

## 2023-01-01 ENCOUNTER — Ambulatory Visit (INDEPENDENT_AMBULATORY_CARE_PROVIDER_SITE_OTHER): Payer: 59 | Admitting: Adult Health

## 2023-01-01 ENCOUNTER — Emergency Department (HOSPITAL_COMMUNITY)
Admission: EM | Admit: 2023-01-01 | Discharge: 2023-01-01 | Discharge status: Against Medical Advice | Payer: 59 | Source: Home / Self Care

## 2023-01-01 VITALS — BP 139/95 | HR 90 | Temp 98.6°F | Ht 62.0 in | Wt 261.0 lb

## 2023-01-01 DIAGNOSIS — F32A Depression, unspecified: Secondary | ICD-10-CM | POA: Diagnosis not present

## 2023-01-01 DIAGNOSIS — Z6841 Body Mass Index (BMI) 40.0 and over, adult: Secondary | ICD-10-CM

## 2023-01-01 DIAGNOSIS — E88819 Insulin resistance, unspecified: Secondary | ICD-10-CM

## 2023-01-01 DIAGNOSIS — E559 Vitamin D deficiency, unspecified: Secondary | ICD-10-CM

## 2023-01-01 DIAGNOSIS — F419 Anxiety disorder, unspecified: Secondary | ICD-10-CM | POA: Diagnosis not present

## 2023-01-01 DIAGNOSIS — T782XXS Anaphylactic shock, unspecified, sequela: Secondary | ICD-10-CM | POA: Diagnosis not present

## 2023-01-01 DIAGNOSIS — E669 Obesity, unspecified: Secondary | ICD-10-CM

## 2023-01-01 DIAGNOSIS — R110 Nausea: Secondary | ICD-10-CM

## 2023-01-01 MED ORDER — VITAMIN D (ERGOCALCIFEROL) 1.25 MG (50000 UNIT) PO CAPS: 50000.0000 [IU] | ORAL_CAPSULE | ORAL | 0 refills | Status: DC

## 2023-01-01 MED ORDER — FLUOXETINE HCL 10 MG PO CAPS: 10.0000 mg | ORAL_CAPSULE | Freq: Every day | ORAL | 0 refills | Status: DC

## 2023-01-01 NOTE — Progress Notes (Signed)
WEIGHT SUMMARY AND BIOMETRICS  Vitals Temp: 98.6 F (37 C) BP: (!) 139/95 Pulse Rate: 90 SpO2: 99 %   Anthropometric Measurements Height: 5\' 2"  (1.575 m) Weight: 261 lb (118.4 kg) BMI (Calculated): 47.73 Weight at Last Visit: 251 lb Weight Lost Since Last Visit: 0 Weight Gained Since Last Visit: 10 lb Starting Weight: 244 lb Total Weight Loss (lbs): 0 lb (0 kg) Peak Weight: 273 lb   Body Composition  Body Fat %: 52.4 % Fat Mass (lbs): 137.2 lbs Muscle Mass (lbs): 118.4 lbs Total Body Water (lbs): 88.8 lbs Visceral Fat Rating : 17   Other Clinical Data Fasting: yes Labs: no Today's Visit #: 27 Starting Date: 09/15/20    Chief Complaint:   OBESITY Debra Horn is here to discuss her progress with her obesity treatment plan. She is on the the Category 2 Plan and states she is following her eating plan approximately 75 % of the time.  She states she is not currently exercising.   Interim History:  Last OV at [facility] was 10/31/224 Following events in fall/winter 2024 1) 11/24/2022 ED Encounter, Allergic Rx 2) 12/07/2022 ED Encounter for Anaphylaxis   She reports yeast infection sx's the last 3 days Debra Horn reports taking left over Doxycycline 100mg  for an acute yeast infection ???? She had two doses yesterday and one dose approximately 60 mins ago - on an empty stomach She present to clinic today quite nauseas and vomited once during the OV.  Reviewed Bioimpedance results with pt: Muscle Mass: +3.6 lbs Adipose Mass: +6.8 lbs  Subjective:   1. Nausea Debra Horn reports taking left over Doxycycline 100mg  for an acute yeast infection ???? She had two doses yesterday and one dose approximately 60 mins ago - on an empty stomach She present to clinic today quite nauseas and vomited once during the OV. After emesis- she denied nausea or abdominal pain  Of note-last dose of Wegovy 1mg  was 12/23/2022  2. Anaphylaxis, sequela 1) 11/24/2022 ED Encounter,  Allergic Rx 2) 12/07/2022 ED Encounter for Anaphylaxis  Reviewed both encounter notes, labs, medications  She feels that the last several weeks she is "allergic to everything". She has never been evaluated by an Allergist She reports having and EPI pen on her person  She denies respiratory distress at present  3. Insulin resistance She has been off daily Metformin XR 500mg  for > 6 weeks She was started on Wegovy 0.25mg  on/about 08/29/2022 Wegovy increased to 0.5mg  on/about 09/26/2022 Wegovy was increased to 1mg  once weekly injectio on/about 11/01/2022 She reports N/V and loose stools that last 2-3 weeks. Denies mass in neck, dysphagia, dyspepsia, persistent hoarseness, abdominal pain.  4. Vitamin D deficiency She has been off weekly Ergocalciferol for 2- 3 weeks  5. Anxiety and Depression She denies SI/HI She report her home life and work environment are both stable She is on daily Porzac 10mg   Assessment/Plan:   1. Nausea REMAIN OFF WEGOVY AND METFORMIN therapy Bland Diet Sip fluids today Debra Horn declined Zofran Rx today  2. Anaphylaxis, sequela Order- - Ambulatory referral to Allergy AVOID [place] REMAIN OFF WEGOVY AND METFORMIN therapy  3. Insulin resistance (Primary) REMAIN OFF WEGOVY AND METFORMIN therapy  4. Vitamin D deficiency Refill - Vitamin D, Ergocalciferol, (DRISDOL) 1.25 MG (50000 UNIT) CAPS capsule; Take 1 capsule (50,000 Units total) by mouth once a week.  Dispense: 4 capsule; Refill: 0  5. Anxiety and Depression Refill FLUoxetine (PROZAC) 10 MG capsule Take 1 capsule (10 mg total)  by mouth daily. Dispense: 90 capsule, Refills: 0 ordered   5. Obesity, current BMI 47.73  Debra Horn is not currently in the action stage of change. As such, her goal is to maintain weight for now. She has agreed to the Category 2 Plan.   1) Discard remaining Doxycycline 2) Remain off Wegovy and Metformin 3) Call established GYN today, re: acute yeast infection 4)  Avoid known food/environmental triggers, re: allergies 5) Always carry, in date EPI pen 6) Keep f/u with PCP on 01/10/2023  Exercise goals: No exercise has been prescribed at this time.  Behavioral modification strategies: increasing lean protein intake, decreasing simple carbohydrates, increasing vegetables, increasing water intake, meal planning and cooking strategies, keeping healthy foods in the home, ways to avoid boredom eating, and planning for success.  Debra Horn has agreed to follow-up with our clinic in 4 weeks. She was informed of the importance of frequent follow-up visits to maximize her success with intensive lifestyle modifications for her multiple health conditions.   Check Fasting Labs at next OV  Objective:   Blood pressure (!) 139/95, pulse 90, temperature 98.6 F (37 C), height 5\' 2"  (1.575 m), weight 261 lb (118.4 kg), SpO2 99%. Body mass index is 47.74 kg/m.  General: Cooperative, alert, well developed, in no acute distress. HEENT: Conjunctivae and lids unremarkable. Cardiovascular: Regular rhythm.  Lungs: Normal work of breathing. Neurologic: No focal deficits.   Lab Results  Component Value Date   CREATININE 0.74 12/07/2022   BUN 12 12/07/2022   NA 139 12/07/2022   K 3.3 (L) 12/07/2022   CL 105 12/07/2022   CO2 25 12/07/2022   Lab Results  Component Value Date   ALT 30 12/07/2022   AST 22 12/07/2022   ALKPHOS 98 12/07/2022   BILITOT 0.5 12/07/2022   Lab Results  Component Value Date   HGBA1C 5.2 08/29/2022   HGBA1C 5.5 03/06/2022   HGBA1C 5.6 09/21/2021   HGBA1C 5.6 02/04/2021   HGBA1C 5.4 09/16/2020   Lab Results  Component Value Date   INSULIN 9.3 08/29/2022   INSULIN 20.2 03/06/2022   INSULIN 37.1 (H) 09/21/2021   INSULIN 17.6 02/04/2021   INSULIN 16.1 09/16/2020   Lab Results  Component Value Date   TSH 1.68 08/18/2021   Lab Results  Component Value Date   CHOL 155 09/16/2020   HDL 46 09/16/2020   LDLCALC 97 09/16/2020   TRIG 62  09/16/2020   Lab Results  Component Value Date   VD25OH 28.5 (L) 08/29/2022   VD25OH 36.9 03/06/2022   VD25OH 25.2 (L) 09/21/2021   Lab Results  Component Value Date   WBC 9.6 12/07/2022   HGB 12.8 12/07/2022   HCT 39.0 12/07/2022   MCV 93.5 12/07/2022   PLT 372 12/07/2022   Lab Results  Component Value Date   IRON 11 (L) 05/25/2022   TIBC 475 (H) 05/25/2022   FERRITIN 8 (L) 05/25/2022   Attestation Statements:   Reviewed by clinician on day of visit: allergies, medications, problem list, medical history, surgical history, family history, social history, and previous encounter notes.  I have reviewed the above documentation for accuracy and completeness, and I agree with the above. -  Selisa Tensley d. Simmie Camerer, NP-C

## 2023-01-10 ENCOUNTER — Inpatient Hospital Stay: Payer: 59 | Admitting: Family Medicine

## 2023-01-17 ENCOUNTER — Ambulatory Visit: Admit: 2023-01-17 | Discharge: 2023-01-17 | Disposition: A | Discharge status: Home / Self Care | Payer: 59

## 2023-01-17 ENCOUNTER — Telehealth: Payer: 59 | Admitting: Physician Assistant

## 2023-01-17 DIAGNOSIS — R09A2 Foreign body sensation, throat: Secondary | ICD-10-CM

## 2023-01-17 MED ORDER — PANTOPRAZOLE SODIUM 40 MG PO TBEC
40.0000 mg | DELAYED_RELEASE_TABLET | Freq: Every day | ORAL | 0 refills | Status: DC
Start: 2023-01-17 — End: 2023-01-30

## 2023-01-17 MED ORDER — PREDNISONE 20 MG PO TABS
40.0000 mg | ORAL_TABLET | Freq: Every day | ORAL | 0 refills | Status: DC
Start: 2023-01-17 — End: 2023-03-02

## 2023-01-17 NOTE — Progress Notes (Signed)
 Virtual Visit Consent   [name], you are scheduled for a virtual visit with a [facility] provider today. Just as with appointments in the office, your consent must be obtained to participate. Your consent will be active for this visit and any virtual visit you may have with one of our providers in the next 365 days. If you have a MyChart account, a copy of this consent can be sent to you electronically.  As this is a virtual visit, video technology does not allow for your provider to perform a traditional examination. This may limit your provider's ability to fully assess your condition. If your provider identifies any concerns that need to be evaluated in person or the need to arrange testing (such as labs, EKG, etc.), we will make arrangements to do so. Although advances in technology are sophisticated, we cannot ensure that it will always work on either your end or our end. If the connection with a video visit is poor, the visit may have to be switched to a telephone visit. With either a video or telephone visit, we are not always able to ensure that we have a secure connection.  By engaging in this virtual visit, you consent to the provision of healthcare and authorize for your insurance to be billed (if applicable) for the services provided during this visit. Depending on your insurance coverage, you may receive a charge related to this service.  I need to obtain your verbal consent now. Are you willing to proceed with your visit today? Elsia A Gulbranson has provided verbal consent on 01/17/2023 for a virtual visit (video or telephone). Angelia Kelp, PA-C  Date: 01/17/2023 12:39 PM  Virtual Visit via Video Note   I, Angelia Kelp, connected with  CHRYSTEL DEGEUS  ([id number], [birth year]) on 01/17/23 at 12:45 PM EST by a video-enabled telemedicine application and verified that I am speaking with the correct person using two identifiers.  Location: Patient: Virtual Visit  Location Patient: Home Provider: Virtual Visit Location Provider: Home Office   I discussed the limitations of evaluation and management by telemedicine and the availability of in person appointments. The patient expressed understanding and agreed to proceed.    History of Present Illness: Debra Horn is a 38 y.o. who identifies as a female who was assigned female at birth, and is being seen today for recurrent allergic reaction. Seen in ER on 11/24/22 and 12/07/22 for same issues. Both times previously were from eating a cake-pop from [employer]. She has been given an Epi-pen but denies using it anytime previously. She was also seen on 12/27/22, virtually, for a recurrence and had reported she had dropped 4 remaining prednisone  pills in a vent and requested a refill of prednisone . She was seen in-person on 01/01/23 for weight management and was referred to an allergist at that visit. This is not scheduled until 01/23/23.  She did also go to the ER on 01/01/23 and to UC in-person today, 01/17/23, but left before being seen both times due to wait times.  Currently, feels like she has a "ball in her throat". She has been using Benadryl  and Prednisone , intermittently, since. Reports it does go away, but returns.  Started after she was initiated on Wegovy . This was discontinued.  Reports she feels like she is being choked. She is able to breathe. Denies any swelling. Does get tingling of the lips when she feels this, questions anxiety. Reports this globus sensation at the sternal notch.  Problems:  Patient Active Problem List   Diagnosis Date Noted   Iron  deficiency anemia 05/25/2022   Shortness of breath 08/22/2021   Moderate persistent asthma with exacerbation 07/28/2021   Diffuse pain 05/31/2021   Allergic reaction 05/11/2021   Iron  deficiency anemia due to chronic blood loss 03/07/2021   Low folate 03/07/2021   Class 3 severe obesity with serious comorbidity and body mass index (BMI)  of 40.0 to 44.9 in adult (HCC) 03/03/2021   CRP elevated 12/01/2020   Elevated sed rate 12/01/2020   Nasal drainage 11/09/2020   Insulin  resistance 10/26/2020   Other chest pain 10/13/2020   Severe sleep apnea 06/24/2020   BMI 45.0-49.9, adult (HCC) 06/24/2020   Status post repeat low transverse cesarean section 07/27/2017   Poor fetal growth affecting management of mother in third trimester    Previous pregnancy complicated by pregnancy-induced hypertension in third trimester, antepartum    IUGR (intrauterine growth restriction) in prior pregnancy, pregnant 03/06/2017   Vitamin D  deficiency 02/17/2017   History of preterm delivery 02/09/2017   History of asthma    Normocytic anemia 02/06/2016   Family history of systemic lupus erythematosus (SLE) in mother 02/19/2014   Hyperthyroidism 02/19/2014   Previous cesarean section 08/08/2012   History of pulmonary embolus (PE) 07/24/2012    Allergies:  Allergies  Allergen Reactions   Banana Anaphylaxis   Cefixime Rash   Medications:  Current Outpatient Medications:    albuterol  (VENTOLIN  HFA) 108 (90 Base) MCG/ACT inhaler, Inhale 2 puffs into the lungs every 6 (six) hours as needed for wheezing or shortness of breath., Disp: 8 g, Rfl: 2   albuterol  (VENTOLIN  HFA) 108 (90 Base) MCG/ACT inhaler, Inhale 1-2 puffs into the lungs every 4 (four) hours as needed for wheezing or shortness of breath., Disp: 18 g, Rfl: 1   albuterol  (VENTOLIN  HFA) 108 (90 Base) MCG/ACT inhaler, Inhale 2 puffs into the lungs every 6 (six) hours as needed for wheezing or shortness of breath., Disp: 8 g, Rfl: 0   albuterol  (VENTOLIN  HFA) 108 (90 Base) MCG/ACT inhaler, Inhale 2 puffs into the lungs every 6 (six) hours as needed for wheezing or shortness of breath., Disp: , Rfl:    aspirin -sod bicarb-citric acid  (ALKA-SELTZER) 325 MG TBEF tablet, Take 325 mg by mouth every 6 (six) hours as needed., Disp: , Rfl:    doxycycline  (VIBRA -TABS) 100 MG tablet, Take 1 tablet  (100 mg total) by mouth 2 (two) times daily., Disp: 14 tablet, Rfl: 0   EPINEPHrine  0.3 mg/0.3 mL IJ SOAJ injection, Inject 0.3 mg into the muscle as needed for anaphylaxis., Disp: 1 each, Rfl: 0   famotidine  (PEPCID ) 20 MG tablet, Take 1 tablet (20 mg total) by mouth daily., Disp: 30 tablet, Rfl: 0   FeFum-FePoly-FA-B Cmp-C-Biot (INTEGRA PLUS ) CAPS, Take 1 capsule by mouth every morning., Disp: 30 capsule, Rfl: 2   FLUoxetine  (PROZAC ) 10 MG capsule, Take 1 capsule (10 mg total) by mouth daily., Disp: 90 capsule, Rfl: 0   fluticasone  (FLONASE ) 50 MCG/ACT nasal spray, SPRAY 2 SPRAYS INTO EACH NOSTRIL EVERY DAY (Patient taking differently: Place 2 sprays into both nostrils daily.), Disp: 16 mL, Rfl: 0   fluticasone  (FLOVENT  HFA) 110 MCG/ACT inhaler, Inhale 2 puffs into the lungs in the morning and at bedtime. (Patient taking differently: Inhale 2 puffs into the lungs 2 (two) times daily as needed.), Disp: 1 each, Rfl: 1   folic acid  (FOLVITE ) 1 MG tablet, Take 1 tablet (1 mg total) by mouth daily.,  Disp: 90 tablet, Rfl: 1   loratadine  (CLARITIN ) 10 MG tablet, TAKE 1 TABLET BY MOUTH EVERY DAY (Patient not taking: Reported on 12/07/2022), Disp: 30 tablet, Rfl: 11   metroNIDAZOLE  (METROGEL ) 0.75 % vaginal gel, PLACE 1 APPLICATORFUL VAGINALLY 2 (TWO) TIMES DAILY. USE MONTHLY AFTER THE PERIOD FOR 6 MONTHS, Disp: 70 g, Rfl: 5   montelukast  (SINGULAIR ) 10 MG tablet, Take 1 tablet (10 mg total) by mouth at bedtime., Disp: 30 tablet, Rfl: 1   predniSONE  (DELTASONE ) 50 MG tablet, 1 tablet PO daily, Disp: 5 tablet, Rfl: 0   Vitamin D , Ergocalciferol , (DRISDOL ) 1.25 MG (50000 UNIT) CAPS capsule, Take 1 capsule (50,000 Units total) by mouth once a week., Disp: 4 capsule, Rfl: 0  Observations/Objective: Patient is well-developed, well-nourished in no acute distress.  Resting comfortably at home.  Head is normocephalic, atraumatic.  No labored breathing.  Speech is clear and coherent with logical content.  Patient  is alert and oriented at baseline.    Assessment and Plan: There are no diagnoses linked to this encounter. - Suspect silent GERD, not allergy  - Prednisone  to calm down possible inflammation from silent GERD for acute relief - Add Pantoprazole  daily with breakfast, can start today once she gets the medication - Push fluids - Small, frequent meals, smaller bites - Limit sugar, caffeine , alcohol, spicy foods, and acidic foods - Keep scheduled follow up with Allergist on 01/23/23 - Seek immediate in-person evaluation if shortness of breath, difficulty breathing, or swelling of the face/mouth/throat do occur  Follow Up Instructions: I discussed the assessment and treatment plan with the patient. The patient was provided an opportunity to ask questions and all were answered. The patient agreed with the plan and demonstrated an understanding of the instructions.  A copy of instructions were sent to the patient via MyChart unless otherwise noted below.    The patient was advised to call back or seek an in-person evaluation if the symptoms worsen or if the condition fails to improve as anticipated.    Angelia Kelp, PA-C

## 2023-01-17 NOTE — Patient Instructions (Signed)
 [name], thank you for joining Angelia Kelp, PA-C for today's virtual visit.  While this provider is not your primary care provider (PCP), if your PCP is located in our provider database this encounter information will be shared with them immediately following your visit.   A [facility] MyChart account gives you access to today's visit and all your visits, tests, and labs performed at [facility] " click here if you don't have a [facility] MyChart account or go to [email or web address]  Consent: (Patient) Debra Horn provided verbal consent for this virtual visit at the beginning of the encounter.  Current Medications:  Current Outpatient Medications:    pantoprazole  (PROTONIX ) 40 MG tablet, Take 1 tablet (40 mg total) by mouth daily., Disp: 30 tablet, Rfl: 0   predniSONE  (DELTASONE ) 20 MG tablet, Take 2 tablets (40 mg total) by mouth daily with breakfast., Disp: 10 tablet, Rfl: 0   albuterol  (VENTOLIN  HFA) 108 (90 Base) MCG/ACT inhaler, Inhale 2 puffs into the lungs every 6 (six) hours as needed for wheezing or shortness of breath., Disp: 8 g, Rfl: 2   albuterol  (VENTOLIN  HFA) 108 (90 Base) MCG/ACT inhaler, Inhale 1-2 puffs into the lungs every 4 (four) hours as needed for wheezing or shortness of breath., Disp: 18 g, Rfl: 1   albuterol  (VENTOLIN  HFA) 108 (90 Base) MCG/ACT inhaler, Inhale 2 puffs into the lungs every 6 (six) hours as needed for wheezing or shortness of breath., Disp: 8 g, Rfl: 0   albuterol  (VENTOLIN  HFA) 108 (90 Base) MCG/ACT inhaler, Inhale 2 puffs into the lungs every 6 (six) hours as needed for wheezing or shortness of breath., Disp: , Rfl:    aspirin -sod bicarb-citric acid  (ALKA-SELTZER) 325 MG TBEF tablet, Take 325 mg by mouth every 6 (six) hours as needed., Disp: , Rfl:    doxycycline  (VIBRA -TABS) 100 MG tablet, Take 1 tablet (100 mg total) by mouth 2 (two) times daily., Disp: 14 tablet, Rfl: 0   EPINEPHrine  0.3 mg/0.3 mL IJ SOAJ  injection, Inject 0.3 mg into the muscle as needed for anaphylaxis., Disp: 1 each, Rfl: 0   famotidine  (PEPCID ) 20 MG tablet, Take 1 tablet (20 mg total) by mouth daily., Disp: 30 tablet, Rfl: 0   FeFum-FePoly-FA-B Cmp-C-Biot (INTEGRA PLUS ) CAPS, Take 1 capsule by mouth every morning., Disp: 30 capsule, Rfl: 2   FLUoxetine  (PROZAC ) 10 MG capsule, Take 1 capsule (10 mg total) by mouth daily., Disp: 90 capsule, Rfl: 0   fluticasone  (FLONASE ) 50 MCG/ACT nasal spray, SPRAY 2 SPRAYS INTO EACH NOSTRIL EVERY DAY (Patient taking differently: Place 2 sprays into both nostrils daily.), Disp: 16 mL, Rfl: 0   fluticasone  (FLOVENT  HFA) 110 MCG/ACT inhaler, Inhale 2 puffs into the lungs in the morning and at bedtime. (Patient taking differently: Inhale 2 puffs into the lungs 2 (two) times daily as needed.), Disp: 1 each, Rfl: 1   folic acid  (FOLVITE ) 1 MG tablet, Take 1 tablet (1 mg total) by mouth daily., Disp: 90 tablet, Rfl: 1   loratadine  (CLARITIN ) 10 MG tablet, TAKE 1 TABLET BY MOUTH EVERY DAY (Patient not taking: Reported on 12/07/2022), Disp: 30 tablet, Rfl: 11   metroNIDAZOLE  (METROGEL ) 0.75 % vaginal gel, PLACE 1 APPLICATORFUL VAGINALLY 2 (TWO) TIMES DAILY. USE MONTHLY AFTER THE PERIOD FOR 6 MONTHS, Disp: 70 g, Rfl: 5   montelukast  (SINGULAIR ) 10 MG tablet, Take 1 tablet (10 mg total) by mouth at bedtime., Disp: 30 tablet, Rfl: 1   Vitamin D , Ergocalciferol , (DRISDOL ) 1.25  MG (50000 UNIT) CAPS capsule, Take 1 capsule (50,000 Units total) by mouth once a week., Disp: 4 capsule, Rfl: 0   Medications ordered in this encounter:  Meds ordered this encounter  Medications   predniSONE  (DELTASONE ) 20 MG tablet    Sig: Take 2 tablets (40 mg total) by mouth daily with breakfast.    Dispense:  10 tablet    Refill:  0    Supervising Provider:   LAMPTEY, PHILIP O [[id number]]   pantoprazole  (PROTONIX ) 40 MG tablet    Sig: Take 1 tablet (40 mg total) by mouth daily.    Dispense:  30 tablet    Refill:  0     Supervising Provider:   LAMPTEY, PHILIP O [[id number]]     *If you need refills on other medications prior to your next appointment, please contact your pharmacy*  Follow-Up: Call back or seek an in-person evaluation if the symptoms worsen or if the condition fails to improve as anticipated.   Virtual Care [phone]  Other Instructions  Food Choices for Gastroesophageal Reflux Disease, Adult When you have gastroesophageal reflux disease (GERD), the foods you eat and your eating habits are very important. Choosing the right foods can help ease the discomfort of GERD. Consider working with a dietitian to help you make healthy food choices. What are tips for following this plan? Reading food labels Look for foods that are low in saturated fat. Foods that have less than 5% of daily value (DV) of fat and 0 g of trans fats may help with your symptoms. Cooking Cook foods using methods other than frying. This may include baking, steaming, grilling, or broiling. These are all methods that do not need a lot of fat for cooking. To add flavor, try to use herbs that are low in spice and acidity. Meal planning  Choose healthy foods that are low in fat, such as fruits, vegetables, whole grains, low-fat dairy products, lean meats, fish, and poultry. Eat frequent, small meals instead of three large meals each day. Eat your meals slowly, in a relaxed setting. Avoid bending over or lying down until 2-3 hours after eating. Limit high-fat foods such as fatty meats or fried foods. Limit your intake of fatty foods, such as oils, butter, and shortening. Avoid the following as told by your health care provider: Foods that cause symptoms. These may be different for different people. Keep a food diary to keep track of foods that cause symptoms. Alcohol. Drinking large amounts of liquid with meals. Eating meals during the 2-3 hours before bed. Lifestyle Maintain a healthy weight. Ask your health  care provider what weight is healthy for you. If you need to lose weight, work with your health care provider to do so safely. Exercise for at least 30 minutes on 5 or more days each week, or as told by your health care provider. Avoid wearing clothes that fit tightly around your waist and chest. Do not use any products that contain nicotine or tobacco. These products include cigarettes, chewing tobacco, and vaping devices, such as e-cigarettes. If you need help quitting, ask your health care provider. Sleep with the head of your bed raised. Use a wedge under the mattress or blocks under the bed frame to raise the head of the bed. Chew sugar-free gum after mealtimes. What foods should I eat?  Eat a healthy, well-balanced diet of fruits, vegetables, whole grains, low-fat dairy products, lean meats, fish, and poultry. Each person is different. Foods that may trigger  symptoms in one person may not trigger any symptoms in another person. Work with your health care provider to identify foods that are safe for you. The items listed above may not be a complete list of recommended foods and beverages. Contact a dietitian for more information. What foods should I avoid? Limiting some of these foods may help manage the symptoms of GERD. Everyone is different. Consult a dietitian or your health care provider to help you identify the exact foods to avoid, if any. Fruits Any fruits prepared with added fat. Any fruits that cause symptoms. For some people this may include citrus fruits, such as oranges, grapefruit, pineapple, and lemons. Vegetables Deep-fried vegetables. [place] fries. Any vegetables prepared with added fat. Any vegetables that cause symptoms. For some people, this may include tomatoes and tomato products, chili peppers, onions and garlic, and horseradish. Grains Pastries or quick breads with added fat. Meats and other proteins High-fat meats, such as fatty beef or pork, hot dogs, ribs, ham,  sausage, salami, and bacon. Fried meat or protein, including fried fish and fried chicken. Nuts and nut butters, in large amounts. Dairy Whole milk and chocolate milk. Sour cream. Cream. Ice cream. Cream cheese. Milkshakes. Fats and oils Butter. Margarine. Shortening. Ghee. Beverages Coffee and tea, with or without caffeine . Carbonated beverages. Sodas. Energy drinks. Fruit juice made with acidic fruits, such as orange or grapefruit. Tomato juice. Alcoholic drinks. Sweets and desserts Chocolate and cocoa. Donuts. Seasonings and condiments Pepper. Peppermint and spearmint. Added salt. Any condiments, herbs, or seasonings that cause symptoms. For some people, this may include curry, hot sauce, or vinegar-based salad dressings. The items listed above may not be a complete list of foods and beverages to avoid. Contact a dietitian for more information. Questions to ask your health care provider Diet and lifestyle changes are usually the first steps that are taken to manage symptoms of GERD. If diet and lifestyle changes do not improve your symptoms, talk with your health care provider about taking medicines. Where to find more information International Foundation for Gastrointestinal Disorders: aboutgerd.org Summary When you have gastroesophageal reflux disease (GERD), food and lifestyle choices may be very helpful in easing the discomfort of GERD. Eat frequent, small meals instead of three large meals each day. Eat your meals slowly, in a relaxed setting. Avoid bending over or lying down until 2-3 hours after eating. Limit high-fat foods such as fatty meats or fried foods. This information is not intended to replace advice given to you by your health care provider. Make sure you discuss any questions you have with your health care provider. Document Revised: 06/30/2019 Document Reviewed: 06/30/2019 Elsevier Patient Education  2024 Elsevier Inc.    If you have been instructed to have an  in-person evaluation today at a local Urgent Care facility, please use the link below. It will take you to a list of all of our available [facility], including address, phone number and hours of operation. Please do not delay care.  [facility]  If you or a family member do not have a primary care provider, use the link below to schedule a visit and establish care. When you choose a [facility] primary care physician or advanced practice provider, you gain a long-term partner in health. Find a Primary Care Provider  Learn more about [facility]'s in-office and virtual care options: [facility] - Get Care Now

## 2023-01-17 NOTE — ED Triage Notes (Signed)
 Attempt 1 - called for pt in lobby  Attempt 2 - called for pt in lobby  Attempt 3 - called phone number on chart, no answer   Attempt 4 - last call in lobby

## 2023-01-23 ENCOUNTER — Encounter: Payer: Self-pay | Admitting: Allergy & Immunology

## 2023-01-23 ENCOUNTER — Other Ambulatory Visit: Payer: Self-pay

## 2023-01-23 ENCOUNTER — Ambulatory Visit (INDEPENDENT_AMBULATORY_CARE_PROVIDER_SITE_OTHER): Payer: 59 | Admitting: Allergy & Immunology

## 2023-01-23 ENCOUNTER — Other Ambulatory Visit (HOSPITAL_COMMUNITY): Payer: Self-pay

## 2023-01-23 VITALS — BP 130/80 | HR 87 | Temp 98.3°F | Resp 16 | Ht 61.42 in | Wt 268.1 lb

## 2023-01-23 DIAGNOSIS — K219 Gastro-esophageal reflux disease without esophagitis: Secondary | ICD-10-CM | POA: Diagnosis not present

## 2023-01-23 DIAGNOSIS — J310 Chronic rhinitis: Secondary | ICD-10-CM | POA: Diagnosis not present

## 2023-01-23 DIAGNOSIS — T7840XD Allergy, unspecified, subsequent encounter: Secondary | ICD-10-CM

## 2023-01-23 DIAGNOSIS — J4530 Mild persistent asthma, uncomplicated: Secondary | ICD-10-CM | POA: Diagnosis not present

## 2023-01-23 MED ORDER — FAMOTIDINE 40 MG PO TABS
40.0000 mg | ORAL_TABLET | Freq: Every day | ORAL | 1 refills | Status: DC
  Filled 2023-01-23: qty 90, 90d supply, fill #0
  Filled 2023-02-27 – 2023-04-26 (×2): qty 90, 90d supply, fill #1

## 2023-01-23 NOTE — Patient Instructions (Addendum)
1. Allergic reaction - possible related to [facility], but hard to tell at this point - We will do allergy testing at the next visit.  - This might help to figure out what is going on.   2. Gastroesophageal reflux disease - Continue with Protonix 40mg  once daily,  - Add on Pepcid 40mg  once daily (this can work with the Protonix to help with GERD).  - GI referral in place.   3. Mild persistent asthma, uncomplicated - Lung testing looks awesome today. - We will stick with Flovent but increase to two puffs twice daily.  - Spacer sample and demonstration provided. - Daily controller medication(s): Flovent 2 puffs twice daily with spacer - Prior to physical activity: albuterol 2 puffs 10-15 minutes before physical activity. - Rescue medications: albuterol 4 puffs every 4-6 hours as needed - Asthma control goals:  * Full participation in all desired activities (may need albuterol before activity) * Albuterol use two time or less a week on average (not counting use with activity) * Cough interfering with sleep two time or less a month * Oral steroids no more than once a year * No hospitalizations  4. Chronic rhinitis - Because of insurance stipulations, we cannot do skin testing on the same day as your first visit. - We are all working to fight this, but for now we need to do two separate visits.  - We will know more after we do testing at the next visit.  - The skin testing visit can be squeezed in at your convenience.  - Then we can make a more full plan to address all of your symptoms. - Be sure to stop your antihistamines for 3 days before this appointment.   5. Return in about 1 week (around 01/30/2023) for ALLERGY TESTING (1-55 + 1-17). You can have the follow up appointment with Dr. Dellis Anes or a Nurse Practicioner (our Nurse Practitioners are excellent and always have Physician oversight!).    Please inform us of any Emergency Department visits, hospitalizations, or changes in  symptoms. Call us before going to the ED for breathing or allergy symptoms since we might be able to fit you in for a sick visit. Feel free to contact us anytime with any questions, problems, or concerns.  It was a pleasure to meet you today!  Websites that have reliable patient information: 1. American Academy of Asthma, Allergy, and Immunology: www.aaaai.org 2. Food Allergy Research and Education (FARE): foodallergy.org 3. Mothers of Asthmatics: http://www.asthmacommunitynetwork.org 4. American College of Allergy, Asthma, and Immunology: www.acaai.org      "Like" [facility] on Facebook and Instagram for our latest updates!      A healthy democracy works best when [employer] participate! Make sure you are registered to vote! If you have moved or changed any of your contact information, you will need to get this updated before voting! Scan the QR codes below to learn more!

## 2023-01-23 NOTE — Progress Notes (Signed)
NEW PATIENT  Date of Service/Encounter:  01/23/23  Consult requested by: Avanell Shackleton, NP-C   Assessment:   Allergic reaction - GERD versus true allergic reaction  Gastroesophageal reflux disease - adding on H2 occur in addition to PPI  Mild persistent asthma, uncomplicated  Chronic rhinitis - planning for skin testing at the next visit  Plan/Recommendations:   1. Allergic reaction - possible related to [facility], but hard to tell at this point - We will do allergy testing at the next visit.  - This might help to figure out what is going on.   2. Gastroesophageal reflux disease - Continue with Protonix 40mg  once daily,  - Add on Pepcid 40mg  once daily (this can work with the Protonix to help with GERD).  - GI referral in place.   3. Mild persistent asthma, uncomplicated - Lung testing looks awesome today. - We will stick with Flovent but increase to two puffs twice daily.  - Spacer sample and demonstration provided. - Daily controller medication(s): Flovent 2 puffs twice daily with spacer - Prior to physical activity: albuterol 2 puffs 10-15 minutes before physical activity. - Rescue medications: albuterol 4 puffs every 4-6 hours as needed - Asthma control goals:  * Full participation in all desired activities (may need albuterol before activity) * Albuterol use two time or less a week on average (not counting use with activity) * Cough interfering with sleep two time or less a month * Oral steroids no more than once a year * No hospitalizations  4. Chronic rhinitis - Because of insurance stipulations, we cannot do skin testing on the same day as your first visit. - We are all working to fight this, but for now we need to do two separate visits.  - We will know more after we do testing at the next visit.  - The skin testing visit can be squeezed in at your convenience.  - Then we can make a more full plan to address all of your symptoms. - Be sure to stop  your antihistamines for 3 days before this appointment.   5. Return in about 1 week (around 01/30/2023) for ALLERGY TESTING (1-55 + 1-17). You can have the follow up appointment with Dr. Dellis Anes or a Nurse Practicioner (our Nurse Practitioners are excellent and always have Physician oversight!).    This note in its entirety was forwarded to the Provider who requested this consultation.  Subjective:   Debra Horn is a 38 y.o. female presenting today for evaluation of  Chief Complaint  Patient presents with   Establish Care    Debra Horn has a history of the following: Patient Active Problem List   Diagnosis Date Noted   Iron deficiency anemia 05/25/2022   Shortness of breath 08/22/2021   Moderate persistent asthma with exacerbation 07/28/2021   Diffuse pain 05/31/2021   Allergic reaction 05/11/2021   Iron deficiency anemia due to chronic blood loss 03/07/2021   Low folate 03/07/2021   Class 3 severe obesity with serious comorbidity and body mass index (BMI) of 40.0 to 44.9 in adult (HCC) 03/03/2021   CRP elevated 12/01/2020   Elevated sed rate 12/01/2020   Nasal drainage 11/09/2020   Insulin resistance 10/26/2020   Other chest pain 10/13/2020   Severe sleep apnea 06/24/2020   BMI 45.0-49.9, adult (HCC) 06/24/2020   Status post repeat low transverse cesarean section 07/27/2017   Poor fetal growth affecting management of mother in third trimester    Previous  pregnancy complicated by pregnancy-induced hypertension in third trimester, antepartum    IUGR (intrauterine growth restriction) in prior pregnancy, pregnant 03/06/2017   Vitamin D deficiency 02/17/2017   History of preterm delivery 02/09/2017   History of asthma    Normocytic anemia 02/06/2016   Family history of systemic lupus erythematosus (SLE) in mother 02/19/2014   Hyperthyroidism 02/19/2014   Previous cesarean section 08/08/2012   History of pulmonary embolus (PE) 07/24/2012    History obtained from:  chart review and patient.  Discussed the use of AI scribe software for clinical note transcription with the patient and/or guardian, who gave verbal consent to proceed.  Debra Horn was referred by Avanell Shackleton, NP-C.     Debra Horn is a 38 y.o. female presenting for an evaluation of possible allergic reactions .  Skyleen presents with recent episodes of dyspnea and a sensation of throat constriction. She describes the sensation as a feeling of choking, which occurs suddenly and without apparent triggers.  She initially associated these episodes with consumption of certain foods from a specific caf, but similar episodes occurred even with different food items. She has sought emergency care for these episodes, with initial suspicion of allergic reactions.  She was previously on [place] shot for three months, which she has recently discontinued. She reports weight gain since discontinuing the medication.  She is afraid that her recent reactions were related to the medication.  She has been off of it for about 2 or 3 weeks at this point.  She was previously being worked up for a bypass, but once she got a job with [employer], the manager tried [employer] first.   Asthma/Respiratory Symptom History: She has a history of asthma, which she feels has worsened with age. She experiences shortness of breath, which is partially relieved by Albuterol. She has been on prednisone for her breathing issues in the past, which she found helpful.  She feels completely normal on the prednisone. She has a history of using multiple inhalers for her asthma, including Flovent, which she takes once daily for maintenance. However, she does not perceive significant improvement with Flovent. She has tried Symbicort in the past but had a negative reaction to it, experiencing several days of difficulty breathing.  Allergic Rhinitis Symptom History: Debra Horn reports a history of runny nose and postnasal drip, which occur daily  throughout the year. She has not been tested for allergies. She was previously considering weight loss surgery, but this has been postponed due to a change in her insurance.   GERD Symptom History: She was recently started on Protonix for suspected GERD, which was suggested during a virtual visit. However, she has not noticed any significant improvement in her symptoms since starting the medication.  She has only been on it for 1 week.    Otherwise, there is no history of other atopic diseases, including drug allergies, stinging insect allergies, or contact dermatitis. There is no significant infectious history. Vaccinations are up to date.    Past Medical History: Patient Active Problem List   Diagnosis Date Noted   Iron deficiency anemia 05/25/2022   Shortness of breath 08/22/2021   Moderate persistent asthma with exacerbation 07/28/2021   Diffuse pain 05/31/2021   Allergic reaction 05/11/2021   Iron deficiency anemia due to chronic blood loss 03/07/2021   Low folate 03/07/2021   Class 3 severe obesity with serious comorbidity and body mass index (BMI) of 40.0 to 44.9 in adult (HCC) 03/03/2021   CRP elevated 12/01/2020  Elevated sed rate 12/01/2020   Nasal drainage 11/09/2020   Insulin resistance 10/26/2020   Other chest pain 10/13/2020   Severe sleep apnea 06/24/2020   BMI 45.0-49.9, adult (HCC) 06/24/2020   Status post repeat low transverse cesarean section 07/27/2017   Poor fetal growth affecting management of mother in third trimester    Previous pregnancy complicated by pregnancy-induced hypertension in third trimester, antepartum    IUGR (intrauterine growth restriction) in prior pregnancy, pregnant 03/06/2017   Vitamin D deficiency 02/17/2017   History of preterm delivery 02/09/2017   History of asthma    Normocytic anemia 02/06/2016   Family history of systemic lupus erythematosus (SLE) in mother 02/19/2014   Hyperthyroidism 02/19/2014   Previous cesarean section  08/08/2012   History of pulmonary embolus (PE) 07/24/2012    Medication List:  Allergies as of 01/23/2023       Reactions   Banana Anaphylaxis   Cefixime Rash        Medication List        Accurate as of January 23, 2023 11:44 AM. If you have any questions, ask your nurse or doctor.          STOP taking these medications    aspirin-sod bicarb-citric acid 325 MG Tbef tablet Commonly known as: ALKA-SELTZER Stopped by: Alfonse Spruce   doxycycline 100 MG tablet Commonly known as: VIBRA-TABS Stopped by: Alfonse Spruce   fluticasone 50 MCG/ACT nasal spray Commonly known as: FLONASE Stopped by: Alfonse Spruce   folic acid 1 MG tablet Commonly known as: FOLVITE Stopped by: Alfonse Spruce   Integra Plus Caps Stopped by: Alfonse Spruce   loratadine 10 MG tablet Commonly known as: CLARITIN Stopped by: Alfonse Spruce   metroNIDAZOLE 0.75 % vaginal gel Commonly known as: METROGEL Stopped by: Alfonse Spruce   montelukast 10 MG tablet Commonly known as: SINGULAIR Stopped by: Alfonse Spruce       TAKE these medications    albuterol 108 (90 Base) MCG/ACT inhaler Commonly known as: VENTOLIN HFA Inhale 2 puffs into the lungs every 6 (six) hours as needed for wheezing or shortness of breath. What changed: Another medication with the same name was removed. Continue taking this medication, and follow the directions you see here. Changed by: Alfonse Spruce   EPINEPHrine 0.3 mg/0.3 mL Soaj injection Commonly known as: EPI-PEN Inject 0.3 mg into the muscle as needed for anaphylaxis.   famotidine 40 MG tablet Commonly known as: Pepcid Take 1 tablet (40 mg total) by mouth daily. What changed:  medication strength how much to take Changed by: Alfonse Spruce   FLUoxetine 10 MG capsule Commonly known as: PROZAC Take 1 capsule (10 mg total) by mouth daily.   fluticasone 110 MCG/ACT inhaler Commonly known  as: Flovent HFA Inhale 2 puffs into the lungs in the morning and at bedtime. What changed:  when to take this reasons to take this   pantoprazole 40 MG tablet Commonly known as: PROTONIX Take 1 tablet (40 mg total) by mouth daily.   predniSONE 20 MG tablet Commonly known as: DELTASONE Take 2 tablets (40 mg total) by mouth daily with breakfast.   Vitamin D (Ergocalciferol) 1.25 MG (50000 UNIT) Caps capsule Commonly known as: DRISDOL Take 1 capsule (50,000 Units total) by mouth once a week.        Birth History: non-contributory  Developmental History: non-contributory  Past Surgical History: Past Surgical History:  Procedure Laterality Date   CESAREAN SECTION  2008; 2014   CESAREAN SECTION N/A 07/27/2017   Procedure: REPEAT CESAREAN SECTION;  Surgeon: Reva Bores, MD;  Location: [facility] BIRTHING SUITES;  Service: Obstetrics;  Laterality: N/A;   FRACTURE SURGERY     ORIF ANKLE FRACTURE Right 10/28/2015   ORIF ANKLE FRACTURE Right 10/28/2015   Procedure: OPEN REDUCTION INTERNAL FIXATION (ORIF) RIGHT ANKLE FRACTURE;  Surgeon: Yolonda Kida, MD;  Location: [facility] OR;  Service: Orthopedics;  Laterality: Right;   TUBAL LIGATION Bilateral 07/27/2017   Procedure: BILATERAL TUBAL LIGATION;  Surgeon: Reva Bores, MD;  Location: [facility] BIRTHING SUITES;  Service: Obstetrics;  Laterality: Bilateral;     Family History: Family History  Problem Relation Age of Onset   Rheum arthritis Mother    Lupus Mother    Arthritis Mother    Asthma Father    Hypertension Father    Diabetes Father    Cancer Father    Cancer Sister        skin   Hypothyroidism Maternal Aunt    Cancer Maternal Grandmother        leukemia   Asthma Maternal Grandfather    Hypertension Maternal Grandfather    Stroke Maternal Grandfather    Hypertension Paternal Grandmother    Cancer Paternal Grandmother    Hyperlipidemia Paternal Grandmother    Diabetes Paternal Grandmother    Lupus Other      Social  History: Terrah lives at home with her family.  They live in a mobile home of unknown age.  There is carpeting throughout the home.  They have electric heating and central cooling.  There are no animals inside or outside of the home.  There are no dust mite covers on the bedding.  There is tobacco exposure in the house as well as the car.  She currently works as a Education administrator for behavioral health.  She has done this for 6 months.  There is no fume, chemical, or dust exposure.  She does not [place] near interstate or industrial area.   Review of systems otherwise negative other than that mentioned in the HPI.    Objective:   Blood pressure 130/80, pulse 87, temperature 98.3 F (36.8 C), temperature source Temporal, resp. rate 16, height 5' 1.42" (1.56 m), weight 268 lb 1.6 oz (121.6 kg), SpO2 97%. Body mass index is 49.97 kg/m.     Physical Exam Vitals reviewed.  Constitutional:      Appearance: She is well-developed. She is obese. She is not ill-appearing, toxic-appearing or diaphoretic.     Comments: Talkative.   HENT:     Head: Normocephalic and atraumatic.     Right Ear: Tympanic membrane, ear canal and external ear normal. No drainage, swelling or tenderness. Tympanic membrane is not injected, scarred, erythematous, retracted or bulging.     Left Ear: Tympanic membrane, ear canal and external ear normal. No drainage, swelling or tenderness. Tympanic membrane is not injected, scarred, erythematous, retracted or bulging.     Nose: No nasal deformity, septal deviation, mucosal edema or rhinorrhea.     Right Turbinates: Enlarged, swollen and pale.     Left Turbinates: Enlarged, swollen and pale.     Right Sinus: No maxillary sinus tenderness or frontal sinus tenderness.     Left Sinus: No maxillary sinus tenderness or frontal sinus tenderness.     Mouth/Throat:     Mouth: Mucous membranes are not pale and not dry.     Pharynx: Uvula midline.  Eyes:  General:         Right eye: No discharge.        Left eye: No discharge.     Conjunctiva/sclera: Conjunctivae normal.     Right eye: Right conjunctiva is not injected. No chemosis.    Left eye: Left conjunctiva is not injected. No chemosis.    Pupils: Pupils are equal, round, and reactive to light.  Cardiovascular:     Rate and Rhythm: Normal rate and regular rhythm.     Heart sounds: Normal heart sounds.  Pulmonary:     Effort: Pulmonary effort is normal. No tachypnea, accessory muscle usage or respiratory distress.     Breath sounds: Normal breath sounds. No wheezing, rhonchi or rales.  Chest:     Chest wall: No tenderness.  Abdominal:     Tenderness: There is no abdominal tenderness. There is no guarding or rebound.  Lymphadenopathy:     Head:     Right side of head: No submandibular, tonsillar or occipital adenopathy.     Left side of head: No submandibular, tonsillar or occipital adenopathy.     Cervical: No cervical adenopathy.  Skin:    General: Skin is warm.     Capillary Refill: Capillary refill takes less than 2 seconds.     Coloration: Skin is not pale.     Findings: No abrasion, erythema, petechiae or rash. Rash is not papular, urticarial or vesicular.  Neurological:     Mental Status: She is alert.      Diagnostic studies:    Spirometry: results normal (FEV1: 2.47/103%, FVC: 2.80/98%, FEV1/FVC: 88%).    Spirometry consistent with normal pattern.    Allergy Studies: deferred due to insurance stipulations that require a separate visit for testing           Malachi Bonds, MD [facility]

## 2023-01-24 ENCOUNTER — Ambulatory Visit: Payer: 59 | Admitting: Family Medicine

## 2023-01-30 ENCOUNTER — Telehealth: Payer: Self-pay

## 2023-01-30 ENCOUNTER — Other Ambulatory Visit (HOSPITAL_COMMUNITY): Payer: Self-pay

## 2023-01-30 ENCOUNTER — Encounter: Payer: Self-pay | Admitting: Allergy & Immunology

## 2023-01-30 ENCOUNTER — Ambulatory Visit (INDEPENDENT_AMBULATORY_CARE_PROVIDER_SITE_OTHER): Payer: 59 | Admitting: Allergy & Immunology

## 2023-01-30 DIAGNOSIS — J302 Other seasonal allergic rhinitis: Secondary | ICD-10-CM

## 2023-01-30 DIAGNOSIS — K219 Gastro-esophageal reflux disease without esophagitis: Secondary | ICD-10-CM | POA: Diagnosis not present

## 2023-01-30 DIAGNOSIS — R09A2 Foreign body sensation, throat: Secondary | ICD-10-CM

## 2023-01-30 DIAGNOSIS — J3089 Other allergic rhinitis: Secondary | ICD-10-CM | POA: Diagnosis not present

## 2023-01-30 DIAGNOSIS — J4541 Moderate persistent asthma with (acute) exacerbation: Secondary | ICD-10-CM

## 2023-01-30 MED ORDER — PANTOPRAZOLE SODIUM 40 MG PO TBEC
40.0000 mg | DELAYED_RELEASE_TABLET | Freq: Every day | ORAL | 5 refills | Status: DC
  Filled 2023-01-30 – 2023-02-27 (×3): qty 30, 30d supply, fill #0
  Filled 2023-04-26: qty 30, 30d supply, fill #1
  Filled 2023-07-20 – 2023-08-06 (×2): qty 30, 30d supply, fill #2

## 2023-01-30 MED ORDER — LEVOCETIRIZINE DIHYDROCHLORIDE 5 MG PO TABS
5.0000 mg | ORAL_TABLET | Freq: Every evening | ORAL | 5 refills | Status: DC
  Filled 2023-01-30: qty 30, 30d supply, fill #0
  Filled 2023-02-27: qty 30, 30d supply, fill #1
  Filled 2023-06-13: qty 30, 30d supply, fill #2
  Filled 2023-07-20 – 2023-08-06 (×2): qty 30, 30d supply, fill #3

## 2023-01-30 MED ORDER — BREZTRI AEROSPHERE 160-9-4.8 MCG/ACT IN AERO
2.0000 | INHALATION_SPRAY | Freq: Two times a day (BID) | RESPIRATORY_TRACT | 5 refills | Status: DC
  Filled 2023-01-30: qty 10.7, 30d supply, fill #0
  Filled 2023-02-27 – 2023-08-06 (×3): qty 10.7, 30d supply, fill #1

## 2023-01-30 MED ORDER — FLUTICASONE PROPIONATE HFA 110 MCG/ACT IN AERO
2.0000 | INHALATION_SPRAY | Freq: Two times a day (BID) | RESPIRATORY_TRACT | 5 refills | Status: DC
  Filled 2023-01-30 – 2023-02-27 (×3): qty 12, 30d supply, fill #0
  Filled 2023-06-13: qty 12, 30d supply, fill #1
  Filled 2023-07-20 – 2023-08-06 (×2): qty 12, 30d supply, fill #2

## 2023-01-30 MED ORDER — ALBUTEROL SULFATE HFA 108 (90 BASE) MCG/ACT IN AERS
2.0000 | INHALATION_SPRAY | Freq: Four times a day (QID) | RESPIRATORY_TRACT | 1 refills | Status: DC | PRN
  Filled 2023-01-30 – 2023-02-27 (×2): qty 18, 25d supply, fill #0
  Filled 2023-03-30: qty 18, 25d supply, fill #1

## 2023-01-30 MED ORDER — RYALTRIS 665-25 MCG/ACT NA SUSP: 2.0000 | Freq: Two times a day (BID) | NASAL | 5 refills | Status: DC | PRN

## 2023-01-30 NOTE — Telephone Encounter (Signed)
Please advise on a different nasal spray as the Ryaltris is not covered through insurance

## 2023-01-30 NOTE — Telephone Encounter (Signed)
Pharmacy Patient Advocate Encounter  Received notification from [facility] that Prior Authorization for Ryaltris [id number] suspension  has been DENIED.  See denial reason below. No denial letter attached in CMM. Will attach denial letter to Media tab once received.   PA #/Case ID/Reference #: This drug/product is not covered under the pharmacy benefit. Prior Authorization is not available.

## 2023-01-30 NOTE — Patient Instructions (Addendum)
1. Allergic reaction - possible related to [facility], but hard to tell at this point - We will do allergy testing at the next visit.  - This might help to figure out what is going on.   2. Gastroesophageal reflux disease - Continue with Protonix 40mg  once daily,  - Add on Pepcid 40mg  once daily (this can work with the Protonix to help with GERD).  - GI referral in place.   3. Mild persistent asthma, uncomplicated - Lung testing looks awesome today. - We will stick with Flovent but increase to two puffs twice daily.  - Spacer sample and demonstration provided. - Daily controller medication(s): Flovent 2 puffs twice daily with spacer - Prior to physical activity: albuterol 2 puffs 10-15 minutes before physical activity. - Rescue medications: albuterol 4 puffs every 4-6 hours as needed - Asthma control goals:  * Full participation in all desired activities (may need albuterol before activity) * Albuterol use two time or less a week on average (not counting use with activity) * Cough interfering with sleep two time or less a month * Oral steroids no more than once a year * No hospitalizations  4. Chronic rhinitis - Testing today showed: grasses, ragweed, weeds, trees, indoor molds, outdoor molds, dust mites, and cat - Copy of test results provided.  - Avoidance measures provided. - Stop taking: your current medications - Start taking: Xyzal (levocetirizine) 5mg  tablet once daily and Ryaltris (olopatadine/mometasone) two sprays per nostril 1-2 times daily as needed - We will send the Ryaltris to a specialty pharmacy called [facility].  - They will call you and send you the prescription in the mail.  - You can use an extra dose of the antihistamine, if needed, for breakthrough symptoms.  - Consider nasal saline rinses 1-2 times daily to remove allergens from the nasal cavities as well as help with mucous clearance (this is especially helpful to do before the nasal sprays are  given) - STRONGLY consider allergy shots as a means of long-term control. - Allergy shots "re-train" and "reset" the immune system to ignore environmental allergens and decrease the resulting immune response to those allergens (sneezing, itchy watery eyes, runny nose, nasal congestion, etc).    - Allergy shots improve symptoms in 75-85% of patients.  - We can discuss more at the next appointment if the medications are not working for you. - The biggest expense is the first set of vials, so it is more affordable after that.   5. Return in about 3 months (around 04/30/2023). You can have the follow up appointment with Dr. Dellis Anes or a Nurse Practicioner (our Nurse Practitioners are excellent and always have Physician oversight!).    Please inform us of any Emergency Department visits, hospitalizations, or changes in symptoms. Call us before going to the ED for breathing or allergy symptoms since we might be able to fit you in for a sick visit. Feel free to contact us anytime with any questions, problems, or concerns.  It was a pleasure to see you again today!  Websites that have reliable patient information: 1. American Academy of Asthma, Allergy, and Immunology: www.aaaai.org 2. Food Allergy Research and Education (FARE): foodallergy.org 3. Mothers of Asthmatics: http://www.asthmacommunitynetwork.org 4. American College of Allergy, Asthma, and Immunology: www.acaai.org      "Like" [place] on Facebook and Instagram for our latest updates!      A healthy democracy works best when [employer] participate! Make sure you are registered to vote! If you have moved  or changed any of your contact information, you will need to get this updated before voting! Scan the QR codes below to learn more!        Airborne Adult Perc - 01/30/23 0945     Time Antigen Placed 0945    Allergen Manufacturer Waynette Buttery    Location Back    Number of Test 57    1. Control-Buffer 50% Glycerol Negative    2.  Control-Histamine 2+    3. Bahia 3+    4. [place] Negative    5. Johnson Negative    6. Kentucky Blue 3+    7. Meadow Fescue 3+    8. Perennial Rye 3+    9. Timothy 3+    10. Ragweed Mix 2+    11. Cocklebur 2+    12. Plantain,  English Negative    13. Baccharis Negative    14. Dog Fennel Negative    15. Russian Thistle Negative    16. Lamb's Quarters Negative    17. Sheep Sorrell Negative    18. Rough Pigweed Negative    19. Marsh Elder, Rough Negative    20. Mugwort, Common Negative    21. Box, Elder Negative    22. Cedar, red 3+    23. Sweet Gum Negative    24. Pecan Pollen Negative    25. Pine Mix Negative    26. Walnut, Black Pollen Negative    27. Red Mulberry Negative    28. Ash Mix Negative    29. Birch Mix Negative    30. Beech American Negative    31. Cottonwood, [place] 2+    32. Hickory, White Negative    33. Maple Mix Negative    34. Oak, [place] Mix Negative    35. Sycamore Eastern 3+    36. Alternaria Alternata 3+    37. Cladosporium Herbarum 3+    38. Aspergillus Mix 3+    39. Penicillium Mix Negative    40. Bipolaris Sorokiniana (Helminthosporium) Negative    41. Drechslera Spicifera (Curvularia) 3+    42. Mucor Plumbeus Negative    43. Fusarium Moniliforme 3+    44. Aureobasidium Pullulans (pullulara) 3+    45. Rhizopus Oryzae Negative    46. Botrytis Cinera Negative    47. Epicoccum Nigrum Negative    48. Phoma Betae Negative    49. Dust Mite Mix 2+    50. Cat Hair 10,000 BAU/ml 2+    51.  Dog Epithelia Negative    52. Mixed Feathers Negative    53. Horse Epithelia Negative    54. Cockroach, German Negative    55. Tobacco Leaf Negative             Intradermal - 01/30/23 1018     Time Antigen Placed 1030    Allergen Manufacturer Greer    Location Arm    Number of Test 6    Control Negative    French [place] Territ[place] Negative    Johnson 4+    Weed Mix 2+    Dog Negative    Cockroach Negative             Food Adult Perc - 01/30/23 0900      Time Antigen Placed 1610    Allergen Manufacturer Waynette Buttery    Location Back    Number of allergen test 17    1. Peanut Negative    2. Soybean Negative    3. Wheat Negative    4. Sesame Negative  5. Milk, Cow Negative    6. Casein Negative    7. Egg White, Chicken Negative    8. Shellfish Mix Negative    9. Fish Mix Negative    10. Cashew Negative    11. Walnut Food Negative    12. Almond Negative    13. Hazelnut Negative    14. Pecan Food Negative    15. Pistachio Negative    16. [place] Nut Negative    17. Coconut Negative             Reducing Pollen Exposure  The American Academy of Allergy, Asthma and Immunology suggests the following steps to reduce your exposure to pollen during allergy seasons.    Do not hang sheets or clothing out to dry; pollen may collect on these items. Do not mow lawns or spend time around freshly cut grass; mowing stirs up pollen. Keep windows closed at night.  Keep car windows closed while driving. Minimize morning activities outdoors, a time when pollen counts are usually at their highest. Stay indoors as much as possible when pollen counts or humidity is high and on windy days when pollen tends to remain in the air longer. Use air conditioning when possible.  Many air conditioners have filters that trap the pollen spores. Use a HEPA room air filter to remove pollen form the indoor air you breathe.  Control of Mold Allergen   Mold and fungi can grow on a variety of surfaces provided certain temperature and moisture conditions exist.  Outdoor molds grow on plants, decaying vegetation and soil.  The major outdoor mold, Alternaria and Cladosporium, are found in very high numbers during hot and dry conditions.  Generally, a late Summer - Fall peak is seen for common outdoor fungal spores.  Rain will temporarily lower outdoor mold spore count, but counts rise rapidly when the rainy period ends.  The most important indoor molds are Aspergillus and  Penicillium.  Dark, humid and poorly ventilated basements are ideal sites for mold growth.  The next most common sites of mold growth are the bathroom and the kitchen.  Outdoor (Seasonal) Mold Control  Positive outdoor molds via skin testing: Alternaria, Cladosporium, and Drechslera (Curvalaria)  Use air conditioning and keep windows closed Avoid exposure to decaying vegetation. Avoid leaf raking. Avoid grain handling. Consider wearing a face mask if working in moldy areas.    Indoor (Perennial) Mold Control   Positive indoor molds via skin testing: Aspergillus, Fusarium, and Aureobasidium (Pullulara)  Maintain humidity below 50%. Clean washable surfaces with 5% bleach solution. Remove sources e.g. contaminated carpets.   Control of Dust Mite Allergen    Dust mites play a major role in allergic asthma and rhinitis.  They occur in environments with high humidity wherever human skin is found.  Dust mites absorb humidity from the atmosphere (ie, they do not drink) and feed on organic matter (including shed human and animal skin).  Dust mites are a microscopic type of insect that you cannot see with the naked eye.  High levels of dust mites have been detected from mattresses, pillows, carpets, upholstered furniture, bed covers, clothes, soft toys and any woven material.  The principal allergen of the dust mite is found in its feces.  A gram of dust may contain 1,000 mites and 250,000 fecal particles.  Mite antigen is easily measured in the air during house cleaning activities.  Dust mites do not bite and do not cause harm to humans, other than by triggering allergies/asthma.  Ways to decrease your exposure to dust mites in your home:  Encase mattresses, box springs and pillows with a mite-impermeable barrier or cover   Wash sheets, blankets and drapes weekly in hot water (130 F) with detergent and dry them in a dryer on the hot setting.  Have the room cleaned frequently with a vacuum  cleaner and a damp dust-mop.  For carpeting or rugs, vacuuming with a vacuum cleaner equipped with a high-efficiency particulate air (HEPA) filter.  The dust mite allergic individual should not be in a room which is being cleaned and should wait 1 hour after cleaning before going into the room. Do not sleep on upholstered furniture (eg, couches).   If possible removing carpeting, upholstered furniture and drapery from the home is ideal.  Horizontal blinds should be eliminated in the rooms where the person spends the most time (bedroom, study, television room).  Washable vinyl, roller-type shades are optimal. Remove all non-washable stuffed toys from the bedroom.  Wash stuffed toys weekly like sheets and blankets above.   Reduce indoor humidity to less than 50%.  Inexpensive humidity monitors can be purchased at most hardware stores.  Do not use a humidifier as can make the problem worse and are not recommended.  Control of Dog or Cat Allergen  Avoidance is the best way to manage a dog or cat allergy. If you have a dog or cat and are allergic to dog or cats, consider removing the dog or cat from the home. If you have a dog or cat but don't want to find it a new home, or if your family wants a pet even though someone in the household is allergic, here are some strategies that may help keep symptoms at bay:  Keep the pet out of your bedroom and restrict it to only a few rooms. Be advised that keeping the dog or cat in only one room will not limit the allergens to that room. Don't pet, hug or kiss the dog or cat; if you do, wash your hands with soap and water. High-efficiency particulate air (HEPA) cleaners run continuously in a bedroom or living room can reduce allergen levels over time. Regular use of a high-efficiency vacuum cleaner or a central vacuum can reduce allergen levels. Giving your dog or cat a bath at least once a week can reduce airborne allergen.  Allergy Shots  Allergies are the  result of a chain reaction that starts in the immune system. Your immune system controls how your body defends itself. For instance, if you have an allergy to pollen, your immune system identifies pollen as an invader or allergen. Your immune system overreacts by producing antibodies called Immunoglobulin E (IgE). These antibodies travel to cells that release chemicals, causing an allergic reaction.  The concept behind allergy immunotherapy, whether it is received in the form of shots or tablets, is that the immune system can be desensitized to specific allergens that trigger allergy symptoms. Although it requires time and patience, the payback can be long-term relief. Allergy injections contain a dilute solution of those substances that you are allergic to based upon your skin testing and allergy history.   How Do Allergy Shots Work?  Allergy shots work much like a vaccine. Your body responds to injected amounts of a particular allergen given in increasing doses, eventually developing a resistance and tolerance to it. Allergy shots can lead to decreased, minimal or no allergy symptoms.  There generally are two phases: build-up and maintenance. Build-up often ranges  from three to six months and involves receiving injections with increasing amounts of the allergens. The shots are typically given once or twice a week, though more rapid build-up schedules are sometimes used.  The maintenance phase begins when the most effective dose is reached. This dose is different for each person, depending on how allergic you are and your response to the build-up injections. Once the maintenance dose is reached, there are longer periods between injections, typically two to four weeks.  Occasionally doctors give cortisone-type shots that can temporarily reduce allergy symptoms. These types of shots are different and should not be confused with allergy immunotherapy shots.  Who Can Be Treated with Allergy  Shots?  Allergy shots may be a good treatment approach for people with allergic rhinitis (hay fever), allergic asthma, conjunctivitis (eye allergy) or stinging insect allergy.   Before deciding to begin allergy shots, you should consider:   The length of allergy season and the severity of your symptoms  Whether medications and/or changes to your environment can control your symptoms  Your desire to avoid long-term medication use  Time: allergy immunotherapy requires a major time commitment  Cost: may vary depending on your insurance coverage  Allergy shots for children age 61 and older are effective and often well tolerated. They might prevent the onset of new allergen sensitivities or the progression to asthma.  Allergy shots are not started on patients who are pregnant but can be continued on patients who become pregnant while receiving them. In some patients with other medical conditions or who take certain common medications, allergy shots may be of risk. It is important to mention other medications you talk to your allergist.   What are the two types of build-ups offered:   RUSH or Rapid Desensitization -- one day of injections lasting from 8:30-4:30pm, injections every 1 hour.  Approximately half of the build-up process is completed in that one day.  The following week, normal build-up is resumed, and this entails ~16 visits either weekly or twice weekly, until reaching your "maintenance dose" which is continued weekly until eventually getting spaced out to every month for a duration of 3 to 5 years. The regular build-up appointments are nurse visits where the injections are administered, followed by required monitoring for 30 minutes.    Traditional build-up -- weekly visits for 6 -12 months until reaching "maintenance dose", then continue weekly until eventually spacing out to every 4 weeks as above. At these appointments, the injections are administered, followed by required  monitoring for 30 minutes.     Either way is acceptable, and both are equally effective. With the rush protocol, the advantage is that less time is spent here for injections overall AND you would also reach maintenance dosing faster (which is when the clinical benefit starts to become more apparent). Not everyone is a candidate for rapid desensitization.   IF we proceed with the RUSH protocol, there are premedications which must be taken the day before and the day after the rush only (this includes antihistamines, steroids, and Singulair).  After the rush day, no prednisone or Singulair is required, and we just recommend antihistamines taken on your injection day.  What Is An Estimate of the Costs?  If you are interested in starting allergy injections, please check with your insurance company about your coverage for both allergy vial sets and allergy injections.  Please do so prior to making the appointment to start injections.  The following are CPT codes to give to your  insurance company. These are the amounts we BILL to the insurance company, but the amount YOU WILL PAY and WE RECEIVE IS SUBSTANTIALLY LESS and depends on the contracts we have with different insurance companies.   Amount Billed to Insurance One allergy vial set  CPT 95165   $ 1200     Two allergy vial set  CPT 95165   $ 2400     Three allergy vial set  CPT 95165   $ 3600     One injection   CPT 95115   $ 35  Two injections   CPT 95117   $ 40 RUSH (Rapid Desensitization) CPT 95180 x 8 hours $500/hour  Regarding the allergy injections, your co-pay may or may not apply with each injection, so please confirm this with your insurance company. When you start allergy injections, 1 or 2 sets of vials are made based on your allergies.  Not all patients can be on one set of vials. A set of vials lasts 6 months to a year depending on how quickly you can proceed with your build-up of your allergy injections. Vials are personalized for each  patient depending on their specific allergens.  How often are allergy injection given during the build-up period?   Injections are given at least weekly during the build-up period until your maintenance dose is achieved. Per the doctor's discretion, you may have the option of getting allergy injections two times per week during the build-up period. However, there must be at least 48 hours between injections. The build-up period is usually completed within 6-12 months depending on your ability to schedule injections and for adjustments for reactions. When maintenance dose is reached, your injection schedule is gradually changed to every two weeks and later to every three weeks. Injections will then continue every 4 weeks. Usually, injections are continued for a total of 3-5 years.   When Will I Feel Better?  Some may experience decreased allergy symptoms during the build-up phase. For others, it may take as long as 12 months on the maintenance dose. If there is no improvement after a year of maintenance, your allergist will discuss other treatment options with you.  If you aren't responding to allergy shots, it may be because there is not enough dose of the allergen in your vaccine or there are missing allergens that were not identified during your allergy testing. Other reasons could be that there are high levels of the allergen in your environment or major exposure to non-allergic triggers like tobacco smoke.  What Is the Length of Treatment?  Once the maintenance dose is reached, allergy shots are generally continued for three to five years. The decision to stop should be discussed with your allergist at that time. Some people may experience a permanent reduction of allergy symptoms. Others may relapse and a longer course of allergy shots can be considered.  What Are the Possible Reactions?  The two types of adverse reactions that can occur with allergy shots are local and systemic. Common local  reactions include very mild redness and swelling at the injection site, which can happen immediately or several hours after. Report a delayed reaction from your last injection. These include arm swelling or runny nose, watery eyes or cough that occurs within 12-24 hours after injection. A systemic reaction, which is less common, affects the entire body or a particular body system. They are usually mild and typically respond quickly to medications. Signs include increased allergy symptoms such as sneezing, a stuffy  nose or hives.   Rarely, a serious systemic reaction called anaphylaxis can develop. Symptoms include swelling in the throat, wheezing, a feeling of tightness in the chest, nausea or dizziness. Most serious systemic reactions develop within 30 minutes of allergy shots. This is why it is strongly recommended you wait in your doctor's office for 30 minutes after your injections. Your allergist is trained to watch for reactions, and his or her staff is trained and equipped with the proper medications to identify and treat them.   Report to the nurse immediately if you experience any of the following symptoms: swelling, itching or redness of the skin, hives, watery eyes/nose, breathing difficulty, excessive sneezing, coughing, stomach pain, diarrhea, or light headedness. These symptoms may occur within 15-20 minutes after injection and may require medication.   Who Should Administer Allergy Shots?  The preferred location for receiving shots is your prescribing allergist's office. Injections can sometimes be given at another facility where the physician and staff are trained to recognize and treat reactions, and have received instructions by your prescribing allergist.  What if I am late for an injection?   Injection dose will be adjusted depending upon how many days or weeks you are late for your injection.   What if I am sick?   Please report any illness to the nurse before receiving  injections. She may adjust your dose or postpone injections depending on your symptoms. If you have fever, flu, sinus infection or chest congestion it is best to postpone allergy injections until you are better. Never get an allergy injection if your asthma is causing you problems. If your symptoms persist, seek out medical care to get your health problem under control.  What If I am or Become Pregnant:  Women that become pregnant should schedule an appointment with [facility] before receiving any further allergy injections.

## 2023-01-30 NOTE — Progress Notes (Signed)
FOLLOW UP  Date of Service/Encounter:  01/30/23   Assessment:   Allergic reaction - GERD versus true allergic reaction (negative testing to the most common foods)   Gastroesophageal reflux disease - adding on H2 occur in addition to PPI   Mild persistent asthma, uncomplicated   Chronic rhinitis - planning for skin testing at the next visit  Plan/Recommendations:   1. Allergic reaction - possible related to [facility], but hard to tell at this point - We will do allergy testing at the next visit.  - This might help to figure out what is going on.   2. Gastroesophageal reflux disease - Continue with Protonix 40mg  once daily,  - Add on Pepcid 40mg  once daily (this can work with the Protonix to help with GERD).  - GI referral in place.   3. Mild persistent asthma, uncomplicated - Lung testing looks awesome today. - We will stick with Flovent but increase to two puffs twice daily.  - Spacer sample and demonstration provided. - Daily controller medication(s): Flovent 2 puffs twice daily with spacer - Prior to physical activity: albuterol 2 puffs 10-15 minutes before physical activity. - Rescue medications: albuterol 4 puffs every 4-6 hours as needed - Asthma control goals:  * Full participation in all desired activities (may need albuterol before activity) * Albuterol use two time or less a week on average (not counting use with activity) * Cough interfering with sleep two time or less a month * Oral steroids no more than once a year * No hospitalizations  4. Chronic rhinitis - Testing today showed: grasses, ragweed, weeds, trees, indoor molds, outdoor molds, dust mites, and cat - Copy of test results provided.  - Avoidance measures provided. - Stop taking: your current medications - Start taking: Xyzal (levocetirizine) 5mg  tablet once daily and Ryaltris (olopatadine/mometasone) two sprays per nostril 1-2 times daily as needed - We will send the Ryaltris to a specialty  pharmacy called [facility].  - They will call you and send you the prescription in the mail.  - You can use an extra dose of the antihistamine, if needed, for breakthrough symptoms.  - Consider nasal saline rinses 1-2 times daily to remove allergens from the nasal cavities as well as help with mucous clearance (this is especially helpful to do before the nasal sprays are given) - STRONGLY consider allergy shots as a means of long-term control. - Allergy shots "re-train" and "reset" the immune system to ignore environmental allergens and decrease the resulting immune response to those allergens (sneezing, itchy watery eyes, runny nose, nasal congestion, etc).    - Allergy shots improve symptoms in 75-85% of patients.  - We can discuss more at the next appointment if the medications are not working for you. - The biggest expense is the first set of vials, so it is more affordable after that.   5. Return in about 3 months (around 04/30/2023). You can have the follow up appointment with Dr. Dellis Horn or a Nurse Practicioner (our Nurse Practitioners are excellent and always have Physician oversight!).    Subjective:   Debra Horn is a 38 y.o. female presenting today for follow up of No chief complaint on file.   Debra Horn has a history of the following: Patient Active Problem List   Diagnosis Date Noted   Iron deficiency anemia 05/25/2022   Shortness of breath 08/22/2021   Moderate persistent asthma with exacerbation 07/28/2021   Diffuse pain 05/31/2021   Allergic reaction 05/11/2021  Iron deficiency anemia due to chronic blood loss 03/07/2021   Low folate 03/07/2021   Class 3 severe obesity with serious comorbidity and body mass index (BMI) of 40.0 to 44.9 in adult (HCC) 03/03/2021   CRP elevated 12/01/2020   Elevated sed rate 12/01/2020   Nasal drainage 11/09/2020   Insulin resistance 10/26/2020   Other chest pain 10/13/2020   Severe sleep apnea 06/24/2020   BMI  45.0-49.9, adult (HCC) 06/24/2020   Status post repeat low transverse cesarean section 07/27/2017   Poor fetal growth affecting management of mother in third trimester    Previous pregnancy complicated by pregnancy-induced hypertension in third trimester, antepartum    IUGR (intrauterine growth restriction) in prior pregnancy, pregnant 03/06/2017   Vitamin D deficiency 02/17/2017   History of preterm delivery 02/09/2017   History of asthma    Normocytic anemia 02/06/2016   Family history of systemic lupus erythematosus (SLE) in mother 02/19/2014   Hyperthyroidism 02/19/2014   Previous cesarean section 08/08/2012   History of pulmonary embolus (PE) 07/24/2012    History obtained from: chart review and patient.  Discussed the use of AI scribe software for clinical note transcription with the patient and/or guardian, who gave verbal consent to proceed.  Debra Horn is a 38 y.o. female presenting for skin testing. She was last seen on January 2025. We could not do testing because her insurance company does not cover testing on the same day as a New Patient visit. She has been off of all antihistamines 3 days in anticipation of the testing.   Otherwise, there have been no changes to her past medical history, surgical history, family history, or social history.    Review of systems otherwise negative other than that mentioned in the HPI.    Objective:   There were no vitals taken for this visit. There is no height or weight on file to calculate BMI.    Physical exam deferred since this was a skin testing appointment only.   Diagnostic studies:   Allergy Studies:     Airborne Adult Perc - 01/30/23 0945     Time Antigen Placed 0945    Allergen Manufacturer Waynette Buttery    Location Back    Number of Test 57    1. Control-Buffer 50% Glycerol Negative    2. Control-Histamine 2+    3. Bahia 3+    4. French Southern Territories Negative    5. Johnson Negative    6. [place] Blue 3+    7. Meadow Fescue 3+     8. Perennial Rye 3+    9. Timothy 3+    10. Ragweed Mix 2+    11. Cocklebur 2+    12. Plantain,  English Negative    13. Baccharis Negative    14. Dog Fennel Negative    15. Russian Thistle Negative    16. Lamb's Quarters Negative    17. Sheep Sorrell Negative    18. Rough Pigweed Negative    19. Marsh Elder, Rough Negative    20. Mugwort, Common Negative    21. Box, Elder Negative    22. Cedar, red 3+    23. Sweet Gum Negative    24. Pecan Pollen Negative    25. Pine Mix Negative    26. Walnut, Black Pollen Negative    27. Red Mulberry Negative    28. Ash Mix Negative    29. Birch Mix Negative    30. Beech American Negative    31. Cottonwood, [place] 2+  32. Hickory, White Negative    33. Maple Mix Negative    34. Oak, [place] Mix Negative    35. Sycamore Eastern 3+    36. Alternaria Alternata 3+    37. Cladosporium Herbarum 3+    38. Aspergillus Mix 3+    39. Penicillium Mix Negative    40. Bipolaris Sorokiniana (Helminthosporium) Negative    41. Drechslera Spicifera (Curvularia) 3+    42. Mucor Plumbeus Negative    43. Fusarium Moniliforme 3+    44. Aureobasidium Pullulans (pullulara) 3+    45. Rhizopus Oryzae Negative    46. Botrytis Cinera Negative    47. Epicoccum Nigrum Negative    48. Phoma Betae Negative    49. Dust Mite Mix 2+    50. Cat Hair 10,000 BAU/ml 2+    51.  Dog Epithelia Negative    52. Mixed Feathers Negative    53. Horse Epithelia Negative    54. Cockroach, German Negative    55. Tobacco Leaf Negative             Intradermal - 01/30/23 1018     Time Antigen Placed 1030    Allergen Manufacturer Greer    Location Arm    Number of Test 6    Control Negative    French Southern Territories Negative    Johnson 4+    Weed Mix 2+    Dog Negative    Cockroach Negative             Food Adult Perc - 01/30/23 0900     Time Antigen Placed 0981    Allergen Manufacturer Waynette Buttery    Location Back    Number of allergen test 17    1. Peanut Negative     2. Soybean Negative    3. Wheat Negative    4. Sesame Negative    5. Milk, Cow Negative    6. Casein Negative    7. Egg White, Chicken Negative    8. Shellfish Mix Negative    9. Fish Mix Negative    10. Cashew Negative    11. Walnut Food Negative    12. Almond Negative    13. Hazelnut Negative    14. Pecan Food Negative    15. Pistachio Negative    16. [place] Nut Negative    17. Coconut Negative             Allergy testing results were read and interpreted by myself, documented by clinical staff.      Malachi Bonds, MD  [facility]

## 2023-01-30 NOTE — Telephone Encounter (Signed)
*  Asthma/Allergy  Pharmacy Patient Advocate Encounter  Received notification from [facility] that Prior Authorization for Ryaltris [id number] suspension  has been APPROVED from 01/30/2023 to 01/29/2024   PA #/Case ID/Reference #: [id number]  *Patient also showing other coverage through [employer]-new encounter will be created for this claim

## 2023-01-31 ENCOUNTER — Other Ambulatory Visit (HOSPITAL_COMMUNITY): Payer: Self-pay

## 2023-01-31 ENCOUNTER — Encounter: Payer: Self-pay | Admitting: Physician Assistant

## 2023-01-31 ENCOUNTER — Ambulatory Visit (INDEPENDENT_AMBULATORY_CARE_PROVIDER_SITE_OTHER): Payer: 59 | Admitting: Adult Health

## 2023-01-31 ENCOUNTER — Encounter (INDEPENDENT_AMBULATORY_CARE_PROVIDER_SITE_OTHER): Payer: Self-pay | Admitting: Adult Health

## 2023-01-31 VITALS — BP 139/82 | HR 73 | Temp 98.4°F | Ht 62.0 in | Wt 266.0 lb

## 2023-01-31 DIAGNOSIS — F32A Depression, unspecified: Secondary | ICD-10-CM | POA: Diagnosis not present

## 2023-01-31 DIAGNOSIS — E669 Obesity, unspecified: Secondary | ICD-10-CM

## 2023-01-31 DIAGNOSIS — F419 Anxiety disorder, unspecified: Secondary | ICD-10-CM | POA: Diagnosis not present

## 2023-01-31 DIAGNOSIS — R09A2 Foreign body sensation, throat: Secondary | ICD-10-CM

## 2023-01-31 DIAGNOSIS — Z6841 Body Mass Index (BMI) 40.0 and over, adult: Secondary | ICD-10-CM

## 2023-01-31 DIAGNOSIS — E559 Vitamin D deficiency, unspecified: Secondary | ICD-10-CM

## 2023-01-31 DIAGNOSIS — E88819 Insulin resistance, unspecified: Secondary | ICD-10-CM

## 2023-01-31 DIAGNOSIS — G4733 Obstructive sleep apnea (adult) (pediatric): Secondary | ICD-10-CM | POA: Diagnosis not present

## 2023-01-31 DIAGNOSIS — J4520 Mild intermittent asthma, uncomplicated: Secondary | ICD-10-CM | POA: Diagnosis not present

## 2023-01-31 DIAGNOSIS — E66813 Obesity, class 3: Secondary | ICD-10-CM

## 2023-01-31 MED ORDER — RYALTRIS 665-25 MCG/ACT NA SUSP: 2.0000 | Freq: Two times a day (BID) | NASAL | 5 refills | Status: DC | PRN

## 2023-01-31 NOTE — Telephone Encounter (Signed)
[facility] should still send it for $39. I have the terrible [facility] plan and I still got it for $39.   Malachi Bonds, MD [facility]

## 2023-01-31 NOTE — Addendum Note (Signed)
Addended by: Areta Haber B on: 01/31/2023 09:30 AM   Modules accepted: Orders

## 2023-01-31 NOTE — Telephone Encounter (Addendum)
Ryaltris prescription has been sent to [facility].

## 2023-01-31 NOTE — Progress Notes (Signed)
WEIGHT SUMMARY AND BIOMETRICS  Vitals Temp: 98.4 F (36.9 C) BP: 139/82 Pulse Rate: 73 SpO2: 95 %   Anthropometric Measurements Height: 5\' 2"  (1.575 m) Weight: 266 lb (120.7 kg) BMI (Calculated): 48.64 Weight at Last Visit: 268lb Weight Lost Since Last Visit: 0 Weight Gained Since Last Visit: 5lb Starting Weight: 244lb Total Weight Loss (lbs): 0 lb (0 kg) Peak Weight: 273lb   Body Composition  Body Fat %: 53.4 % Fat Mass (lbs): 142.4 lbs Muscle Mass (lbs): 118.2 lbs Total Body Water (lbs): 90.8 lbs Visceral Fat Rating : 17   Other Clinical Data Fasting: yes Labs: yes Today's Visit #: 28 Starting Date: 09/15/20    Chief Complaint:   OBESITY Debra Horn is here to discuss her progress with her obesity treatment plan. She is on the the Category 2 Plan and states she is following her eating plan approximately 50 % of the time.  She states she is exercising Walking 30 minutes 3 times per week.   Interim History:  She has been OFF Wegovy therapy since 3rd week of Dec 16109, been off all GLP-1 therapy > 4 weeks  She report the following the last month: + Polyuria + Polyuria - Polydipsia  She has stopped all soda intake 6 days ago.  She endorses stress r/t to "life" She is married with children. 57 yr old boy 76 yr old girl 60 tr old boy- severe autism (nonverbal, not potty trained)  Subjective:   1. Vitamin D deficiency She is on weekly Ergocalciferol  2. Insulin resistance She has been OFF Wegovy therapy since 3rd week of Dec 60454, been off all GLP-1 therapy > 4 weeks She is not currently on any antidiabetic medications. She report the following the last month: + Polyuria + Polyuria - Polydipsia  She was previously on Metformin XR- tolerated well. She is currently on Prednisone for globus sensation  3. Globus sensation 01/17/2023 Virtual OV History of Present Illness: Debra Horn is a 38 y.o. who identifies as a female who was assigned  female at birth, and is being seen today for recurrent allergic reaction. Seen in ER on 11/24/22 and 12/07/22 for same issues. Both times previously were from eating a cake-pop from [employer]. She has been given an Epi-pen but denies using it anytime previously. She was also seen on 12/27/22, virtually, for a recurrence and had reported she had dropped 4 remaining prednisone pills in a vent and requested a refill of prednisone. She was seen in-person on 01/01/23 for weight management and was referred to an allergist at that visit. This is not scheduled until 01/23/23.  She did also go to the ER on 01/01/23 and to UC in-person today, 01/17/23, but left before being seen both times due to wait times.   Currently, feels like she has a "ball in her throat". She has been using Benadryl and Prednisone, intermittently, since. Reports it does go away, but returns.   Started after she was initiated on Wegovy. This was discontinued.   Reports she feels like she is being choked. She is able to breathe. Denies any swelling. Does get tingling of the lips when she feels this, questions anxiety. Reports this globus sensation at the sternal notch.    Assessment and Plan: There are no diagnoses linked to this encounter. - Suspect silent GERD, not allergy - Prednisone to calm down possible inflammation from silent GERD for acute relief - Add Pantoprazole daily with breakfast, can start today once she gets  the medication - Push fluids - Small, frequent meals, smaller bites - Limit sugar, caffeine, alcohol, spicy foods, and acidic foods - Keep scheduled follow up with Allergist on 01/23/23 - Seek immediate in-person evaluation if shortness of breath, difficulty breathing, or swelling of the face/mouth/throat do occur 4. Anxiety and depression She is on weekly Ergocalciferol   Assessment/Plan:   1. Vitamin D deficiency Check Labs - VITAMIN D 25 Hydroxy (Vit-D Deficiency, Fractures)  2. Insulin resistance  (Primary) Check Labs - Lipase - Comprehensive metabolic panel - Hemoglobin A1c - Insulin, random - Vitamin B12  If A1c, CBG, or Insulin elevated- she is agreeable to restarting Metformin XR  3. Globus sensation - Prednisone to calm down possible inflammation from silent GERD for acute relief - Add Pantoprazole daily with breakfast, can start today once she gets the medication - Push fluids - Small, frequent meals, smaller bites - Limit sugar, caffeine, alcohol, spicy foods, and acidic foods F/u with Allergist and GI   4. Anxiety and depression Check Labs - TSH + free T4  5. Obesity, current BMI 48.64  Debra Horn is currently in the action stage of change. As such, her goal is to continue with weight loss efforts. She has agreed to the Category 2 Plan.  Increase plan compliance to at least 90%  Exercise goals: All adults should avoid inactivity. Some physical activity is better than none, and adults who participate in any amount of physical activity gain some health benefits. Adults should also include muscle-strengthening activities that involve all major muscle groups on 2 or more days a week.  Behavioral modification strategies: increasing lean protein intake, decreasing simple carbohydrates, increasing vegetables, increasing water intake, no skipping meals, meal planning and cooking strategies, keeping healthy foods in the home, ways to avoid boredom eating, and planning for success.  Debra Horn has agreed to follow-up with our clinic in 4 weeks. She was informed of the importance of frequent follow-up visits to maximize her success with intensive lifestyle modifications for her multiple health conditions.   Debra Horn was informed we would discuss her lab results at her next visit unless there is a critical issue that needs to be addressed sooner. [name] agreed to keep her next visit at the agreed upon time to discuss these results.  Objective:   Blood pressure 139/82, pulse 73,  temperature 98.4 F (36.9 C), height 5\' 2"  (1.575 m), weight 266 lb (120.7 kg), last menstrual period 01/11/2023, SpO2 95%. Body mass index is 48.65 kg/m.  General: Cooperative, alert, well developed, in no acute distress. HEENT: Conjunctivae and lids unremarkable. Cardiovascular: Regular rhythm.  Lungs: Normal work of breathing. Neurologic: No focal deficits.   Lab Results  Component Value Date   CREATININE 0.74 12/07/2022   BUN 12 12/07/2022   NA 139 12/07/2022   K 3.3 (L) 12/07/2022   CL 105 12/07/2022   CO2 25 12/07/2022   Lab Results  Component Value Date   ALT 30 12/07/2022   AST 22 12/07/2022   ALKPHOS 98 12/07/2022   BILITOT 0.5 12/07/2022   Lab Results  Component Value Date   HGBA1C 5.2 08/29/2022   HGBA1C 5.5 03/06/2022   HGBA1C 5.6 09/21/2021   HGBA1C 5.6 02/04/2021   HGBA1C 5.4 09/16/2020   Lab Results  Component Value Date   INSULIN 9.3 08/29/2022   INSULIN 20.2 03/06/2022   INSULIN 37.1 (H) 09/21/2021   INSULIN 17.6 02/04/2021   INSULIN 16.1 09/16/2020   Lab Results  Component Value Date   TSH 1.68  08/18/2021   Lab Results  Component Value Date   CHOL 155 09/16/2020   HDL 46 09/16/2020   LDLCALC 97 09/16/2020   TRIG 62 09/16/2020   Lab Results  Component Value Date   VD25OH 28.5 (L) 08/29/2022   VD25OH 36.9 03/06/2022   VD25OH 25.2 (L) 09/21/2021   Lab Results  Component Value Date   WBC 9.6 12/07/2022   HGB 12.8 12/07/2022   HCT 39.0 12/07/2022   MCV 93.5 12/07/2022   PLT 372 12/07/2022   Lab Results  Component Value Date   IRON 11 (L) 05/25/2022   TIBC 475 (H) 05/25/2022   FERRITIN 8 (L) 05/25/2022   Attestation Statements:   Reviewed by clinician on day of visit: allergies, medications, problem list, medical history, surgical history, family history, social history, and previous encounter notes.  I have reviewed the above documentation for accuracy and completeness, and I agree with the above. -  Syon Tews d. Avram Danielson, NP-C

## 2023-02-01 ENCOUNTER — Other Ambulatory Visit (INDEPENDENT_AMBULATORY_CARE_PROVIDER_SITE_OTHER): Payer: Self-pay | Admitting: Adult Health

## 2023-02-01 ENCOUNTER — Encounter (INDEPENDENT_AMBULATORY_CARE_PROVIDER_SITE_OTHER): Payer: Self-pay | Admitting: Adult Health

## 2023-02-01 MED ORDER — METFORMIN HCL ER 500 MG PO TB24: 500.0000 mg | ORAL_TABLET | Freq: Every day | ORAL | 0 refills | Status: DC

## 2023-02-02 LAB — HEMOGLOBIN A1C
Est. average glucose Bld gHb Est-mCnc: 117 mg/dL
Hgb A1c MFr Bld: 5.7 % — ABNORMAL HIGH (ref 4.8–5.6)

## 2023-02-02 LAB — TSH+FREE T4
Free T4: 1.13 ng/dL (ref 0.82–1.77)
TSH: 0.731 u[IU]/mL (ref 0.450–4.500)

## 2023-02-02 LAB — COMPREHENSIVE METABOLIC PANEL
ALT: 22 [IU]/L (ref 0–32)
AST: 14 [IU]/L (ref 0–40)
Albumin: 3.9 g/dL (ref 3.9–4.9)
Alkaline Phosphatase: 94 [IU]/L (ref 44–121)
BUN/Creatinine Ratio: 15 (ref 9–23)
BUN: 11 mg/dL (ref 6–20)
Bilirubin Total: 0.3 mg/dL (ref 0.0–1.2)
CO2: 21 mmol/L (ref 20–29)
Calcium: 9.2 mg/dL (ref 8.7–10.2)
Chloride: 99 mmol/L (ref 96–106)
Creatinine, Ser: 0.72 mg/dL (ref 0.57–1.00)
Globulin, Total: 3 g/dL (ref 1.5–4.5)
Glucose: 109 mg/dL — ABNORMAL HIGH (ref 70–99)
Potassium: 4.6 mmol/L (ref 3.5–5.2)
Sodium: 137 mmol/L (ref 134–144)
Total Protein: 6.9 g/dL (ref 6.0–8.5)
eGFR: 110 mL/min/{1.73_m2} (ref >59)

## 2023-02-02 LAB — LIPASE: Lipase: 33 U/L (ref 14–72)

## 2023-02-02 LAB — VITAMIN D 25 HYDROXY (VIT D DEFICIENCY, FRACTURES): Vit D, 25-Hydroxy: 22.6 ng/mL — ABNORMAL LOW (ref 30.0–100.0)

## 2023-02-02 LAB — INSULIN, RANDOM: INSULIN: 28.3 u[IU]/mL — ABNORMAL HIGH (ref 2.6–24.9)

## 2023-02-02 LAB — VITAMIN B12: Vitamin B-12: 401 pg/mL (ref 232–1245)

## 2023-02-06 DIAGNOSIS — J3081 Allergic rhinitis due to animal (cat) (dog) hair and dander: Secondary | ICD-10-CM

## 2023-02-06 NOTE — Addendum Note (Signed)
Addended by: Alfonse Spruce on: 02/06/2023 12:10 PM   Modules accepted: Orders

## 2023-02-06 NOTE — Progress Notes (Signed)
 Aeroallergen Immunotherapy  Ordering Provider: Dr. Marty Shaggy  Patient Details Name: JILLIEN YAKEL MRN: [id number] Date of Birth: 1985-03-12  Order 2 of 2  Vial Label: Molds/DM  0.2 ml (Volume)  1:20 Concentration -- Alternaria alternata 0.2 ml (Volume)  1:20 Concentration -- Cladosporium herbarum 0.2 ml (Volume)  1:10 Concentration -- Aspergillus mix 0.2 ml (Volume)  1:20 Concentration -- Drechslera spicifera 0.2 ml (Volume)  1:10 Concentration -- Fusarium moniliforme 0.2 ml (Volume)  1:40 Concentration -- Aureobasidium pullulans 1.0 ml (Volume)   AU Concentration -- Mite Mix (DF 5,000 & DP 5,000)   2.2  ml Extract Subtotal 2.8  ml Diluent  5.0  ml Maintenance Total  Schedule:  B  Blue Vial (1:100,000): Schedule B (6 doses) Yellow Vial (1:10,000): Schedule B (6 doses) Green Vial (1:1,000): Schedule B (6 doses) Red Vial (1:100): Schedule A (14 doses)  Special Instructions: After completion of the first Red Vial, please space to every two weeks. After completion of the second Red Vial, please space to every 4 weeks. Ok to up dose new vials at 0.53mL --> 0.3 mL --> 0.5 mL. Ok to come twice weekly, if desired, as long as there is 48 hours between injections.

## 2023-02-06 NOTE — Progress Notes (Signed)
VIALS EXP 02-06-24

## 2023-02-06 NOTE — Progress Notes (Signed)
 Aeroallergen Immunotherapy  Ordering Provider: Dr. Marty Shaggy  Patient Details Name: Debra Horn MRN: [id number] Date of Birth: 1985/05/29  Order 1 of 2  Vial Label: G/RW/W/T/C  0.3 ml (Volume)  BAU Concentration -- 7 Grass Mix* 100,000 ([place]  Blue, Concord, Orchard, Perennial Rye, RedTop, Sweet Vernal, Timothy) 0.3 ml (Volume)  1:20 Concentration -- Ragweed Mix 0.2 ml (Volume)  1:20 Concentration -- Cocklebur 0.5 ml (Volume)  1:20 Concentration -- Eastern 10 Tree Mix (also Sweet Gum) 0.2 ml (Volume)  1:10 Concentration -- Cedar, red 0.2 ml (Volume)  1:20 Concentration -- Cottonwood, Eastern* 0.2 ml (Volume)  1:10 Concentration -- Sycamore Eastern* 0.8 ml (Volume)  1:10 Concentration -- Cat Hair   2.7  ml Extract Subtotal 2.3  ml Diluent  5.0  ml Maintenance Total  Schedule:  B  Blue Vial (1:100,000): Schedule B (6 doses) Yellow Vial (1:10,000): Schedule B (6 doses) Green Vial (1:1,000): Schedule B (6 doses) Red Vial (1:100): Schedule A (14 doses)  Special Instructions: After completion of the first Red Vial, please space to every two weeks. After completion of the second Red Vial, please space to every 4 weeks. Ok to up dose new vials at 0.52mL --> 0.3 mL --> 0.5 mL. Ok to come twice weekly, if desired, as long as there is 48 hours between injections.

## 2023-02-07 DIAGNOSIS — J3089 Other allergic rhinitis: Secondary | ICD-10-CM | POA: Diagnosis not present

## 2023-02-10 DIAGNOSIS — H524 Presbyopia: Secondary | ICD-10-CM | POA: Diagnosis not present

## 2023-02-16 ENCOUNTER — Other Ambulatory Visit (INDEPENDENT_AMBULATORY_CARE_PROVIDER_SITE_OTHER): Payer: Self-pay | Admitting: Adult Health

## 2023-02-16 DIAGNOSIS — E559 Vitamin D deficiency, unspecified: Secondary | ICD-10-CM

## 2023-02-21 ENCOUNTER — Ambulatory Visit: Payer: 59

## 2023-02-23 ENCOUNTER — Other Ambulatory Visit (HOSPITAL_COMMUNITY): Payer: Self-pay

## 2023-02-23 ENCOUNTER — Ambulatory Visit (INDEPENDENT_AMBULATORY_CARE_PROVIDER_SITE_OTHER): Payer: 59

## 2023-02-23 DIAGNOSIS — J309 Allergic rhinitis, unspecified: Secondary | ICD-10-CM | POA: Diagnosis not present

## 2023-02-23 MED ORDER — EPINEPHRINE 0.3 MG/0.3ML IJ SOAJ
0.3000 mg | INTRAMUSCULAR | 1 refills | Status: DC | PRN
  Filled 2023-02-23: qty 2, 14d supply, fill #0

## 2023-02-23 NOTE — Progress Notes (Signed)
Immunotherapy   Patient Details  Name: HENRI GUEDES MRN: [id number] Date of Birth: 1985-10-19  02/23/2023  Leotis Shames A Fellner started injections for  G-RW-W-T-C and MOLD- DM exp date 02/06/2024 Following schedule: B  Frequency:2 times per week Epi-Pen:Prescription for Epi-Pen given Consent signed and patient instructions given. Patient waited in office for 30 minutes without any issues.    Deborra Medina 02/23/2023, 4:33 PM

## 2023-02-28 ENCOUNTER — Other Ambulatory Visit: Payer: Self-pay

## 2023-03-02 ENCOUNTER — Telehealth: Payer: 59 | Admitting: Physician Assistant

## 2023-03-02 DIAGNOSIS — J4541 Moderate persistent asthma with (acute) exacerbation: Secondary | ICD-10-CM

## 2023-03-02 MED ORDER — PREDNISONE 20 MG PO TABS
40.0000 mg | ORAL_TABLET | Freq: Every day | ORAL | 0 refills | Status: DC
Start: 2023-03-02 — End: 2023-04-08

## 2023-03-02 NOTE — Progress Notes (Signed)
 E-Visit for Asthma  Based on what you have shared with me, it looks like you may have a flare up of your asthma.  Asthma is a chronic (ongoing) lung disease which results in airway obstruction, inflammation and hyper-responsiveness.   Asthma symptoms vary from person to person, with common symptoms including nighttime awakening and decreased ability to participate in normal activities as a result of shortness of breath. It is often triggered by changes in weather, changes in the season, changes in air temperature, or inside (home, school, daycare or work) allergens such as animal dander, mold, mildew, woodstoves or cockroaches.   It can also be triggered by hormonal changes, extreme emotion, physical exertion or an upper respiratory tract illness.     It is important to identify the trigger, and then eliminate or avoid the trigger if possible.   If you have been prescribed medications to be taken on a regular basis, it is important to follow the asthma action plan and to follow guidelines to adjust medication in response to increasing symptoms of decreased peak expiratory flow rate  Treatment: I have prescribed: Prednisone 40mg  by mouth per day for 5 - 7 days  HOME CARE Only take medications as instructed by your medical team. Consider wearing a mask or scarf to improve breathing air temperature have been shown to decrease irritation and decrease exacerbations Get rest. Taking a steamy shower or using a humidifier may help nasal congestion sand ease sore throat pain. You can place a towel over your head and breathe in the steam from hot water coming from a faucet. Using a saline nasal spray works much the same way.  Cough drops, hare candies and sore throat lozenges may ease your cough.  Avoid close contacts especially the very you and the elderly Cover your mouth if you cough or  sneeze Always remember to wash your hands.    GET HELP RIGHT AWAY IF: You develop worsening symptoms; breathlessness at rest, drowsy, confused or agitated, unable to speak in full sentences You have coughing fits You develop a severe headache or visual changes You develop shortness of breath, difficulty breathing or start having chest pain Your symptoms persist after you have completed your treatment plan If your symptoms do not improve within 10 days  MAKE SURE YOU Understand these instructions. Will watch your condition. Will get help right away if you are not doing well or get worse.   Your e-visit answers were reviewed by a board certified advanced clinical practitioner to complete your personal care plan, Depending upon the condition, your plan could have included both over the counter or prescription medications.   Please review your pharmacy choice. Your safety is important to [place]. If you have drug allergies check your prescription carefully.  You can use MyChart to ask questions about today's visit, request a non-urgent  call back, or ask for a work or school excuse for 24 hours related to this e-Visit. If it has been greater than 24 hours you will need to follow up with your provider, or enter a new e-Visit to address those concerns.   You will get an e-mail in the next two days asking about your experience. I hope that your e-visit has been valuable and will speed your recovery. Thank you for using e-visits.  I have spent 5 minutes in review of e-visit questionnaire, review and updating patient chart, medical decision making and response to patient.   Margaretann Loveless, PA-C

## 2023-03-03 ENCOUNTER — Other Ambulatory Visit (HOSPITAL_COMMUNITY): Payer: Self-pay

## 2023-03-06 ENCOUNTER — Other Ambulatory Visit (HOSPITAL_COMMUNITY): Payer: Self-pay

## 2023-03-06 ENCOUNTER — Ambulatory Visit (INDEPENDENT_AMBULATORY_CARE_PROVIDER_SITE_OTHER): Payer: 59 | Admitting: Adult Health

## 2023-03-06 ENCOUNTER — Encounter (INDEPENDENT_AMBULATORY_CARE_PROVIDER_SITE_OTHER): Payer: Self-pay | Admitting: Adult Health

## 2023-03-06 VITALS — BP 138/74 | HR 76 | Temp 98.8°F | Ht 62.0 in | Wt 269.0 lb

## 2023-03-06 DIAGNOSIS — E669 Obesity, unspecified: Secondary | ICD-10-CM | POA: Diagnosis not present

## 2023-03-06 DIAGNOSIS — E66813 Obesity, class 3: Secondary | ICD-10-CM

## 2023-03-06 DIAGNOSIS — Z6841 Body Mass Index (BMI) 40.0 and over, adult: Secondary | ICD-10-CM

## 2023-03-06 DIAGNOSIS — F32A Depression, unspecified: Secondary | ICD-10-CM

## 2023-03-06 DIAGNOSIS — F419 Anxiety disorder, unspecified: Secondary | ICD-10-CM

## 2023-03-06 DIAGNOSIS — E559 Vitamin D deficiency, unspecified: Secondary | ICD-10-CM

## 2023-03-06 DIAGNOSIS — R09A2 Foreign body sensation, throat: Secondary | ICD-10-CM | POA: Diagnosis not present

## 2023-03-06 DIAGNOSIS — E88819 Insulin resistance, unspecified: Secondary | ICD-10-CM

## 2023-03-06 MED ORDER — METFORMIN HCL ER 750 MG PO TB24
750.0000 mg | ORAL_TABLET | Freq: Every day | ORAL | 0 refills | Status: DC
  Filled 2023-03-06 – 2023-04-26 (×2): qty 90, 90d supply, fill #0

## 2023-03-06 MED ORDER — VITAMIN D (ERGOCALCIFEROL) 1.25 MG (50000 UNIT) PO CAPS
50000.0000 [IU] | ORAL_CAPSULE | ORAL | 0 refills | Status: DC
  Filled 2023-03-06 – 2023-04-26 (×2): qty 8, 24d supply, fill #0

## 2023-03-06 MED ORDER — FLUOXETINE HCL 20 MG PO CAPS
20.0000 mg | ORAL_CAPSULE | Freq: Every day | ORAL | 0 refills | Status: DC
  Filled 2023-03-06 – 2023-04-26 (×2): qty 90, 90d supply, fill #0

## 2023-03-06 NOTE — Progress Notes (Signed)
 WEIGHT SUMMARY AND BIOMETRICS  Vitals Temp: 98.8 F (37.1 C) BP: 138/74 Pulse Rate: 76 SpO2: 98 %   Anthropometric Measurements Height: 5\' 2"  (1.575 m) Weight: 269 lb (122 kg) BMI (Calculated): 49.19 Weight at Last Visit: 266 lb Weight Lost Since Last Visit: 0 Weight Gained Since Last Visit: 3 lb Starting Weight: 244 lb Total Weight Loss (lbs): 0 lb (0 kg) Peak Weight: 273 lb   Body Composition  Body Fat %: 53.4 % Fat Mass (lbs): 143.6 lbs Muscle Mass (lbs): 119 lbs Total Body Water (lbs): 89.4 lbs Visceral Fat Rating : 18   Other Clinical Data Today's Visit #: 105 Starting Date: 09/15/20    Chief Complaint:   OBESITY Debra Horn is here to discuss her progress with her obesity treatment plan.  She is on the the Category 2 Plan and states she is following her eating plan approximately 75 % of the time.  She states she is exercising: None   Interim History:  Debra Horn was started on loading dose [facility] 0.25mg  on/about 08/29/2022  09/26/2022 Wegovy increased to 0.5mg  Increased Wegvoy from 0.5mg  to 1mg  on/about 11/02/2022 Due to SE Wegovy 1mg  stopped late Dec 2024  Weight August 2024: 258  lbs Weight Dec 2024: 261  lbs Weight today: 269 lbs  Stress- she works 2nd shift at [employer] She enjoys second shift  Exercise-None Hydration-she strives for half gallon of water Also drinking Sprite (  Breakfast: 2 fried eggs 2 pieces of bacon Snack: none Lunch: skips most often [place] packet or eggs again Snack: none Dinner: either chicken or tilapia with brocolli and rice or potatoes OR just salad  Subjective:   1. Globus sensation She is currently on daily protonix 40mg  and pepcid 40mg  She reports GERD sx's currently well controlled She denies sensation "of a ball in my throat". She has been off weekly Wegovy 1mg  since last Dec 2024  She has f/u with established GI 03/12/2023  2. Insulin resistance Discussed Labs  Latest Reference  Range & Units 01/31/23 11:55  Glucose 70 - 99 mg/dL 657 (H)  Hemoglobin Q4O 4.8 - 5.6 % 5.7 (H)  Est. average glucose Bld gHb Est-mCnc mg/dL 962  INSULIN 2.6 - 95.2 uIU/mL 28.3 (H)  (H): Data is abnormally high  Latest Reference Range & Units 01/31/23 11:55  eGFR >59 mL/min/1.73 110   Started on Metformin XR 500mg  end of Jan 2025 She denies decreased cravings and states "I don't feel any differently"  3. Vitamin D deficiency Discussed Labs  Latest Reference Range & Units 01/31/23 11:55  Vitamin D, 25-Hydroxy 30.0 - 100.0 ng/mL 22.6 (L)   Vit D level well below goal of 50-70 She is on weekly Ergocalciferol- denies N/V/Muscle Weakness  4. Anxiety and depression Discussed Labs  Latest Reference Range & Units 01/31/23 11:55  TSH 0.450 - 4.500 uIU/mL 0.731  T4,Free(Direct) 0.82 - 1.77 ng/dL 8.41    Latest Reference Range & Units 01/31/23 11:55  Vitamin D, 25-Hydroxy 30.0 - 100.0 ng/mL 22.6 (L)  Vitamin B12 232 - 1,245 pg/mL 401  (L): Data is abnormally low  Thyroid panel stable B12 level stable Vit D level subtherapeutic- on weekly Ergocalciferol  She is on daily Prozac 10mg   She endorses persistent anxiety She vehemently denies SI/HI She is agreeable to increasing dose to 20mg  daily Discussed the health benefits of regular exercise, especially in regards to mood stabilization  Assessment/Plan:   1. Globus sensation (Primary) Continue current regime and avoid  known trigger foods Remain off Wegovy F/u with GI  3. Insulin resistance Refill and INCREASE metFORMIN (GLUCOPHAGE-XR) 750 MG 24 hr tablet Take 1 tablet (750 mg total) by mouth daily with supper. Dispense: 90 tablet, Refills: 0 of 0 remaining   4. Vitamin D deficiency Refill and INCREASE - Vitamin D, Ergocalciferol, (DRISDOL) 1.25 MG (50000 UNIT) CAPS capsule; Take 1 capsule (50,000 Units total) by mouth every 3 (three) days.  Dispense: 8 capsule; Refill: 0  5. Anxiety and depression Refill and  INCREASE  FLUoxetine (PROZAC) 20 MG capsule Take 1 capsule (20 mg total) by mouth daily. Dispense: 90 capsule, Refills: 0 of 0 remaining   6.Obesity, current BMI 49.2 Remain off Wegovy Start regular exercise- strive for at least 1 x week  [name] is currently in the action stage of change. As such, her goal is to get back to weightloss efforts . She has agreed to the Category 2 Plan.   Exercise goals: All adults should avoid inactivity. Some physical activity is better than none, and adults who participate in any amount of physical activity gain some health benefits. Adults should also include muscle-strengthening activities that involve all major muscle groups on 2 or more days a week.  Behavioral modification strategies: increasing lean protein intake, decreasing simple carbohydrates, increasing vegetables, increasing water intake, no skipping meals, meal planning and cooking strategies, keeping healthy foods in the home, ways to avoid boredom eating, ways to avoid night time snacking, better snacking choices, emotional eating strategies, planning for success, and decreasing junk food.  Anupama has agreed to follow-up with our clinic in 4 weeks. She was informed of the importance of frequent follow-up visits to maximize her success with intensive lifestyle modifications for her multiple health conditions.   Objective:   Blood pressure 138/74, pulse 76, temperature 98.8 F (37.1 C), height 5\' 2"  (1.575 m), weight 269 lb (122 kg), SpO2 98%. Body mass index is 49.2 kg/m.  General: Cooperative, alert, well developed, in no acute distress. HEENT: Conjunctivae and lids unremarkable. Cardiovascular: Regular rhythm.  Lungs: Normal work of breathing. Neurologic: No focal deficits.   Lab Results  Component Value Date   CREATININE 0.72 01/31/2023   BUN 11 01/31/2023   NA 137 01/31/2023   K 4.6 01/31/2023   CL 99 01/31/2023   CO2 21 01/31/2023   Lab Results  Component Value Date   ALT 22  01/31/2023   AST 14 01/31/2023   ALKPHOS 94 01/31/2023   BILITOT 0.3 01/31/2023   Lab Results  Component Value Date   HGBA1C 5.7 (H) 01/31/2023   HGBA1C 5.2 08/29/2022   HGBA1C 5.5 03/06/2022   HGBA1C 5.6 09/21/2021   HGBA1C 5.6 02/04/2021   Lab Results  Component Value Date   INSULIN 28.3 (H) 01/31/2023   INSULIN 9.3 08/29/2022   INSULIN 20.2 03/06/2022   INSULIN 37.1 (H) 09/21/2021   INSULIN 17.6 02/04/2021   Lab Results  Component Value Date   TSH 0.731 01/31/2023   Lab Results  Component Value Date   CHOL 155 09/16/2020   HDL 46 09/16/2020   LDLCALC 97 09/16/2020   TRIG 62 09/16/2020   Lab Results  Component Value Date   VD25OH 22.6 (L) 01/31/2023   VD25OH 28.5 (L) 08/29/2022   VD25OH 36.9 03/06/2022   Lab Results  Component Value Date   WBC 9.6 12/07/2022   HGB 12.8 12/07/2022   HCT 39.0 12/07/2022   MCV 93.5 12/07/2022   PLT 372 12/07/2022   Lab Results  Component Value Date   IRON 11 (L) 05/25/2022   TIBC 475 (H) 05/25/2022   FERRITIN 8 (L) 05/25/2022    Attestation Statements:   Reviewed by clinician on day of visit: allergies, medications, problem list, medical history, surgical history, family history, social history, and previous encounter notes.  I have reviewed the above documentation for accuracy and completeness, and I agree with the above. -  Anna Livers d. Autumn Gunn, NP-C

## 2023-03-09 ENCOUNTER — Ambulatory Visit (INDEPENDENT_AMBULATORY_CARE_PROVIDER_SITE_OTHER): Payer: Self-pay

## 2023-03-09 DIAGNOSIS — J309 Allergic rhinitis, unspecified: Secondary | ICD-10-CM

## 2023-03-12 ENCOUNTER — Ambulatory Visit: Payer: 59 | Admitting: Physician Assistant

## 2023-03-16 ENCOUNTER — Ambulatory Visit (INDEPENDENT_AMBULATORY_CARE_PROVIDER_SITE_OTHER): Payer: Self-pay | Admitting: *Deleted

## 2023-03-16 ENCOUNTER — Ambulatory Visit: Payer: 59 | Admitting: Obstetrics

## 2023-03-16 DIAGNOSIS — J309 Allergic rhinitis, unspecified: Secondary | ICD-10-CM

## 2023-03-19 ENCOUNTER — Other Ambulatory Visit (HOSPITAL_COMMUNITY): Payer: Self-pay

## 2023-03-25 ENCOUNTER — Encounter: Payer: Self-pay | Admitting: Internal Medicine

## 2023-03-30 ENCOUNTER — Other Ambulatory Visit (HOSPITAL_COMMUNITY): Payer: Self-pay

## 2023-03-31 ENCOUNTER — Other Ambulatory Visit (HOSPITAL_COMMUNITY): Payer: Self-pay

## 2023-04-03 ENCOUNTER — Ambulatory Visit (INDEPENDENT_AMBULATORY_CARE_PROVIDER_SITE_OTHER): Admitting: Adult Health

## 2023-04-08 ENCOUNTER — Telehealth: Admitting: Family

## 2023-04-08 DIAGNOSIS — J4541 Moderate persistent asthma with (acute) exacerbation: Secondary | ICD-10-CM

## 2023-04-08 NOTE — Progress Notes (Signed)
  Because you are having recurrent symptoms, I feel your condition warrants further evaluation and I recommend that you be seen in a face-to-face visit.   NOTE: There will be NO CHARGE for this E-Visit   If you are having a true medical emergency, please call 911.     For an urgent face to face visit, [place] has multiple urgent care centers for your convenience.  Click the link below for the full list of locations and hours, walk-in wait times, appointment scheduling options and driving directions:  Urgent Care - [place], [place], [place], [place], [place], [place]  [place]     Your MyChart E-visit questionnaire answers were reviewed by a board certified advanced clinical practitioner to complete your personal care plan based on your specific symptoms.    Thank you for using e-Visits.

## 2023-04-09 ENCOUNTER — Encounter (INDEPENDENT_AMBULATORY_CARE_PROVIDER_SITE_OTHER): Payer: Self-pay

## 2023-04-16 ENCOUNTER — Encounter: Payer: Self-pay | Admitting: Physician Assistant

## 2023-04-17 ENCOUNTER — Ambulatory Visit: Admitting: Obstetrics

## 2023-04-18 ENCOUNTER — Ambulatory Visit (INDEPENDENT_AMBULATORY_CARE_PROVIDER_SITE_OTHER): Payer: Self-pay

## 2023-04-18 DIAGNOSIS — J309 Allergic rhinitis, unspecified: Secondary | ICD-10-CM | POA: Diagnosis not present

## 2023-04-23 DIAGNOSIS — R062 Wheezing: Secondary | ICD-10-CM | POA: Diagnosis not present

## 2023-04-24 ENCOUNTER — Ambulatory Visit (INDEPENDENT_AMBULATORY_CARE_PROVIDER_SITE_OTHER)

## 2023-04-24 DIAGNOSIS — J309 Allergic rhinitis, unspecified: Secondary | ICD-10-CM

## 2023-04-26 ENCOUNTER — Other Ambulatory Visit: Payer: Self-pay

## 2023-04-26 ENCOUNTER — Other Ambulatory Visit (HOSPITAL_COMMUNITY): Payer: Self-pay

## 2023-04-27 ENCOUNTER — Ambulatory Visit (INDEPENDENT_AMBULATORY_CARE_PROVIDER_SITE_OTHER): Payer: Self-pay

## 2023-04-27 DIAGNOSIS — J309 Allergic rhinitis, unspecified: Secondary | ICD-10-CM

## 2023-04-29 ENCOUNTER — Other Ambulatory Visit: Payer: Self-pay | Admitting: Allergy & Immunology

## 2023-04-30 ENCOUNTER — Other Ambulatory Visit (HOSPITAL_COMMUNITY): Payer: Self-pay

## 2023-04-30 MED ORDER — ALBUTEROL SULFATE HFA 108 (90 BASE) MCG/ACT IN AERS
2.0000 | INHALATION_SPRAY | Freq: Four times a day (QID) | RESPIRATORY_TRACT | 1 refills | Status: DC | PRN
  Filled 2023-04-30 – 2023-05-14 (×2): qty 18, 25d supply, fill #0
  Filled 2023-06-13: qty 18, 25d supply, fill #1

## 2023-05-01 ENCOUNTER — Encounter: Payer: Self-pay | Admitting: Allergy & Immunology

## 2023-05-01 ENCOUNTER — Other Ambulatory Visit: Payer: Self-pay

## 2023-05-01 ENCOUNTER — Ambulatory Visit (INDEPENDENT_AMBULATORY_CARE_PROVIDER_SITE_OTHER): Admitting: Adult Health

## 2023-05-01 ENCOUNTER — Telehealth: Payer: Self-pay | Admitting: Physician Assistant

## 2023-05-01 ENCOUNTER — Telehealth: Payer: Self-pay | Admitting: Allergy & Immunology

## 2023-05-01 ENCOUNTER — Encounter (INDEPENDENT_AMBULATORY_CARE_PROVIDER_SITE_OTHER): Payer: Self-pay | Admitting: Adult Health

## 2023-05-01 ENCOUNTER — Other Ambulatory Visit (HOSPITAL_COMMUNITY): Payer: Self-pay

## 2023-05-01 ENCOUNTER — Ambulatory Visit (INDEPENDENT_AMBULATORY_CARE_PROVIDER_SITE_OTHER): Payer: 59 | Admitting: Allergy & Immunology

## 2023-05-01 VITALS — BP 135/84 | HR 66 | Temp 98.4°F | Ht 62.0 in | Wt 270.0 lb

## 2023-05-01 VITALS — BP 130/72 | HR 72 | Temp 97.9°F | Resp 20

## 2023-05-01 DIAGNOSIS — E669 Obesity, unspecified: Secondary | ICD-10-CM

## 2023-05-01 DIAGNOSIS — Z6841 Body Mass Index (BMI) 40.0 and over, adult: Secondary | ICD-10-CM

## 2023-05-01 DIAGNOSIS — F32A Depression, unspecified: Secondary | ICD-10-CM

## 2023-05-01 DIAGNOSIS — E559 Vitamin D deficiency, unspecified: Secondary | ICD-10-CM | POA: Diagnosis not present

## 2023-05-01 DIAGNOSIS — J3089 Other allergic rhinitis: Secondary | ICD-10-CM | POA: Diagnosis not present

## 2023-05-01 DIAGNOSIS — J4541 Moderate persistent asthma with (acute) exacerbation: Secondary | ICD-10-CM | POA: Diagnosis not present

## 2023-05-01 DIAGNOSIS — F419 Anxiety disorder, unspecified: Secondary | ICD-10-CM

## 2023-05-01 DIAGNOSIS — J302 Other seasonal allergic rhinitis: Secondary | ICD-10-CM

## 2023-05-01 DIAGNOSIS — E88819 Insulin resistance, unspecified: Secondary | ICD-10-CM

## 2023-05-01 DIAGNOSIS — K219 Gastro-esophageal reflux disease without esophagitis: Secondary | ICD-10-CM

## 2023-05-01 MED ORDER — PREDNISONE 10 MG PO TABS: ORAL_TABLET | ORAL | 0 refills | Status: DC

## 2023-05-01 MED ORDER — VITAMIN D (ERGOCALCIFEROL) 1.25 MG (50000 UNIT) PO CAPS
50000.0000 [IU] | ORAL_CAPSULE | ORAL | 0 refills | Status: DC
  Filled 2023-05-01: qty 8, 24d supply, fill #0

## 2023-05-01 MED ORDER — PREDNISONE 10 MG PO TABS
ORAL_TABLET | ORAL | 0 refills | Status: DC
  Filled 2023-05-01: qty 12, fill #0
  Filled 2023-05-01: qty 12, 6d supply, fill #0

## 2023-05-01 MED ORDER — METFORMIN HCL ER 750 MG PO TB24
750.0000 mg | ORAL_TABLET | Freq: Every day | ORAL | 0 refills | Status: DC
  Filled 2023-05-01: qty 90, 90d supply, fill #0

## 2023-05-01 NOTE — Telephone Encounter (Signed)
 Patient's pharmacy called stating the quantity did not match the directions for the prescription of Prednisone . The pharmacist states for the directions the patient would need a quantity of 18 tablets instead of 12.

## 2023-05-01 NOTE — Progress Notes (Signed)
 FOLLOW UP  Date of Service/Encounter:  05/01/23   Assessment:   Allergic reaction - GERD versus true allergic reaction (negative testing to the most common foods)   Gastroesophageal reflux disease - adding on H2 occur in addition to PPI   Mild persistent asthma, uncomplicated - with AEC ranging from 100-300   Perennial and seasonal allergic rhinitis (grasses, ragweed, weeds, trees, indoor molds, outdoor molds, dust mites, and cat)    Plan/Recommendations:   1. Gastroesophageal reflux disease - Continue with Protonix  40mg  once daily,  - Add on Pepcid  40mg  once daily (this can work with the Protonix  to help with GERD).  - GI referral in place.   2. Mild persistent asthma, uncomplicated - Lung testing looks awesome today. - Start prednisone  taper provided. - Try to get the Breztri  in two puffs twice daily EVERY DAY. - New copay card provided. - Daily controller medication(s): Breztri  160/9/4.81mcg two puffs twice daily - Prior to physical activity: albuterol  2 puffs 10-15 minutes before physical activity. - Rescue medications: albuterol  4 puffs every 4-6 hours as needed - Asthma control goals:  * Full participation in all desired activities (may need albuterol  before activity) * Albuterol  use two time or less a week on average (not counting use with activity) * Cough interfering with sleep two time or less a month * Oral steroids no more than once a year * No hospitalizations  3. Chronic rhinitis (grasses, ragweed, weeds, trees, indoor molds, outdoor molds, dust mites, and cat) - Continue with allergy  shots at the same schedule. - Let us  know if there are large local reactions (swelling or redness) - Continue taking: Xyzal  (levocetirizine) 5mg  tablet once daily and Ryaltris  (olopatadine/mometasone) two sprays per nostril 1-2 times daily as needed4 - We will send the Ryaltris  to a specialty pharmacy called [facility].  - They will call you and send you the  prescription in the mail.  - You can use an extra dose of the antihistamine, if needed, for breakthrough symptoms.   5. Return in about 3 months (around 07/31/2023). You can have the follow up appointment with Dr. Idolina Horn or a Nurse Practicioner (our Nurse Practitioners are excellent and always have Physician oversight!).    Subjective:   Debra Horn is a 38 y.o. female presenting today for follow up of  Chief Complaint  Patient presents with   Follow-up    Allergy  and Asthma has been doing well until this past week. Having some chest tightness. Took prednisone  over the last 2 days. Unsure if it is weather related. Used inhaler once a day usually at work, this week having to use during the day which is unusual.    Debra Horn has a history of the following: Patient Active Problem List   Diagnosis Date Noted   Iron  deficiency anemia 05/25/2022   Shortness of breath 08/22/2021   Moderate persistent asthma with exacerbation 07/28/2021   Diffuse pain 05/31/2021   Allergic reaction 05/11/2021   Iron  deficiency anemia due to chronic blood loss 03/07/2021   Low folate 03/07/2021   Class 3 severe obesity with serious comorbidity and body mass index (BMI) of 40.0 to 44.9 in adult (HCC) 03/03/2021   CRP elevated 12/01/2020   Elevated sed rate 12/01/2020   Nasal drainage 11/09/2020   Insulin  resistance 10/26/2020   Other chest pain 10/13/2020   Severe sleep apnea 06/24/2020   BMI 45.0-49.9, adult (HCC) 06/24/2020   Status post repeat low transverse cesarean section 07/27/2017   Poor  fetal growth affecting management of mother in third trimester    Previous pregnancy complicated by pregnancy-induced hypertension in third trimester, antepartum    IUGR (intrauterine growth restriction) in prior pregnancy, pregnant 03/06/2017   Vitamin D  deficiency 02/17/2017   History of preterm delivery 02/09/2017   History of asthma    Normocytic anemia 02/06/2016   Family history of systemic  lupus erythematosus (SLE) in mother 02/19/2014   Hyperthyroidism 02/19/2014   Previous cesarean section 08/08/2012   History of pulmonary embolus (PE) 07/24/2012    History obtained from: chart review and patient.  Discussed the use of AI scribe software for clinical note transcription with the patient and/or guardian, who gave verbal consent to proceed.  Debra Horn is a 38 y.o. female presenting for a follow up visit.  She was last seen in January 2025.  At that time, we continued on Protonix  40 mg daily and added Pepcid  once a day as well.  We did refer her to gastroenterology.  For her asthma, we continue with Flovent  210 mcg 2 puffs twice daily and albuterol  as needed.  For her rhinitis, she had positive testing to multiple indoor and outdoor allergens.  We stopped her medications and started Xyzal  and Ryaltris .  She also made the decision to start allergen immunotherapy.  Since last visit, she started having problems late last week.   Asthma/Respiratory Symptom History: She has been experiencing breathing difficulties that have worsened over the past week, although there is some overall improvement. She takes Breztri , two puffs twice daily, although she has faced issues with insurance coverage and cost. She has been using leftover prednisone , taking one tablet for the past two days, which she finds helpful.   Allergic Rhinitis Symptom History: She is unable to identify a specific trigger but suspects environmental factors such as weather changes or potential mold exposure in her old home, which had a leak. No visible mold has been observed. She denies any worsening of symptoms with her allergy  shots, which cause mild itching but no significant swelling or redness. She is currently using a nasal spray as needed. She remains on her allergy  shots.   Debra Horn is on allergen immunotherapy. She receives two injections. Immunotherapy script #1 contains  ragweed, trees, weeds, grasses, and cat. She  currently receives 0.78mL of the BLUE vial (1/100,000). Immunotherapy script #2 contains molds and dust mites. She currently receives 0.70mL of the BLUE vial (1/100,000). She started shots February of 2025 and not yet reached maintenance.   Her past medical history includes a septal perforation, which she attributes to an infection during pregnancy 16 years ago. She denies any history of drug use. She has had elevated eosinophils in the past.  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 She mentions a recent incident at work involving a disruptive patient.   Otherwise, there have been no changes to her past medical history, surgical history, family history, or social history.    Review of systems otherwise negative other than that mentioned in the HPI.    Objective:   Blood pressure 130/72, pulse 72, temperature 97.9 F (36.6 C), temperature source Temporal, resp. rate 20, SpO2 96%. There is no height or weight on file to calculate BMI.    Physical Exam Vitals reviewed.  Constitutional:      Appearance: She is well-developed. She is obese. She is not ill-appearing, toxic-appearing or diaphoretic.  Comments: Talkative.   HENT:     Head: Normocephalic and atraumatic.     Right Ear: Tympanic membrane, ear canal and external ear normal. No drainage, swelling or tenderness. Tympanic membrane is not injected, scarred, erythematous, retracted or bulging.     Left Ear: Tympanic membrane, ear canal and external ear normal. No drainage, swelling or tenderness. Tympanic membrane is not injected, scarred, erythematous, retracted or bulging.     Nose: No nasal deformity, septal deviation, mucosal edema or rhinorrhea.     Right Turbinates: Enlarged, swollen and pale.     Left Turbinates: Enlarged, swollen and pale.     Right Sinus: No maxillary sinus tenderness or frontal sinus tenderness.     Left Sinus: No maxillary  sinus tenderness or frontal sinus tenderness.     Comments: No polyps noted.     Mouth/Throat:     Mouth: Mucous membranes are not pale and not dry.     Pharynx: Uvula midline.  Eyes:     General:        Right eye: No discharge.        Left eye: No discharge.     Conjunctiva/sclera: Conjunctivae normal.     Right eye: Right conjunctiva is not injected. No chemosis.    Left eye: Left conjunctiva is not injected. No chemosis.    Pupils: Pupils are equal, round, and reactive to light.  Cardiovascular:     Rate and Rhythm: Normal rate and regular rhythm.     Heart sounds: Normal heart sounds.  Pulmonary:     Effort: Pulmonary effort is normal. No tachypnea, accessory muscle usage or respiratory distress.     Breath sounds: Normal breath sounds. No wheezing, rhonchi or rales.  Chest:     Chest wall: No tenderness.  Lymphadenopathy:     Head:     Right side of head: No submandibular, tonsillar or occipital adenopathy.     Left side of head: No submandibular, tonsillar or occipital adenopathy.     Cervical: No cervical adenopathy.  Skin:    General: Skin is warm.     Capillary Refill: Capillary refill takes less than 2 seconds.     Coloration: Skin is not pale.     Findings: No abrasion, erythema, petechiae or rash. Rash is not papular, urticarial or vesicular.  Neurological:     Mental Status: She is alert.  Psychiatric:        Behavior: Behavior is cooperative.      Diagnostic studies:    Spirometry: results normal (FEV1: 2.52/105%, FVC: 3.55/124%, FEV1/FVC: 71%).    Spirometry consistent with normal pattern.   Allergy  Studies: none       Drexel Gentles, MD  [facility] 

## 2023-05-01 NOTE — Telephone Encounter (Signed)
 Scheduled appointments per the patients request. The patient is aware of the appointment details and is active on MyChart.

## 2023-05-01 NOTE — Patient Instructions (Addendum)
 1. Gastroesophageal reflux disease - Continue with Protonix  40mg  once daily,  - Add on Pepcid  40mg  once daily (this can work with the Protonix  to help with GERD).  - GI referral in place.   2. Mild persistent asthma, uncomplicated - Lung testing looks awesome today. - Start prednisone  taper provided. - Try to get the Breztri  in two puffs twice daily EVERY DAY. - New copay card provided. - Daily controller medication(s): Breztri  160/9/4.68mcg two puffs twice daily - Prior to physical activity: albuterol  2 puffs 10-15 minutes before physical activity. - Rescue medications: albuterol  4 puffs every 4-6 hours as needed - Asthma control goals:  * Full participation in all desired activities (may need albuterol  before activity) * Albuterol  use two time or less a week on average (not counting use with activity) * Cough interfering with sleep two time or less a month * Oral steroids no more than once a year * No hospitalizations  3. Chronic rhinitis (grasses, ragweed, weeds, trees, indoor molds, outdoor molds, dust mites, and cat) - Continue with allergy  shots at the same schedule. - Let us  know if there are large local reactions (swelling or redness) - Continue taking: Xyzal  (levocetirizine) 5mg  tablet once daily and Ryaltris  (olopatadine/mometasone) two sprays per nostril 1-2 times daily as needed4 - We will send the Ryaltris  to a specialty pharmacy called [facility].  - They will call you and send you the prescription in the mail.  - You can use an extra dose of the antihistamine, if needed, for breakthrough symptoms.   5. Return in about 3 months (around 07/31/2023). You can have the follow up appointment with Dr. Idolina Maker or a Nurse Practicioner (our Nurse Practitioners are excellent and always have Physician oversight!).    Please inform us  of any Emergency Department visits, hospitalizations, or changes in symptoms. Call us  before going to the ED for breathing or allergy  symptoms  since we might be able to fit you in for a sick visit. Feel free to contact us  anytime with any questions, problems, or concerns.  It was a pleasure to see you again today!  Websites that have reliable patient information: 1. American Academy of Asthma, Allergy , and Immunology: www.aaaai.org 2. Food Allergy  Research and Education (FARE): foodallergy.org 3. Mothers of Asthmatics: http://www.asthmacommunitynetwork.org 4. American College of Allergy , Asthma, and Immunology: www.acaai.org      "Like" us  on Facebook and Instagram for our latest updates!      A healthy democracy works best when [employer] participate! Make sure you are registered to vote! If you have moved or changed any of your contact information, you will need to get this updated before voting! Scan the QR codes below to learn more!

## 2023-05-01 NOTE — Progress Notes (Addendum)
 WEIGHT SUMMARY AND BIOMETRICS  Vitals Temp: 98.4 F (36.9 C) BP: 135/84 Pulse Rate: 66 SpO2: 99 %   Anthropometric Measurements Height: 5\' 2"  (1.575 m) Weight: 270 lb (122.5 kg) BMI (Calculated): 49.37 Weight at Last Visit: 269 lb Weight Lost Since Last Visit: 0 Weight Gained Since Last Visit: 1 lb Starting Weight: 244 lb Total Weight Loss (lbs): 0 lb (0 kg) Peak Weight: 273 lb   Body Composition  Body Fat %: 53.6 % Fat Mass (lbs): 145.2 lbs Muscle Mass (lbs): 119.2 lbs Total Body Water (lbs): 90.4 lbs Visceral Fat Rating : 18   Other Clinical Data Fasting: no Labs: no Today's Visit #: 30 Starting Date: 09/15/20    Chief Complaint:   OBESITY Debra Horn is here to discuss her progress with her obesity treatment plan.  She is on the the Category 2 Plan and states she is following her eating plan approximately 75 % of the time.  She states she is exercising: NEAT Activitities  Interim History:  She has refrained from all juice the last 30 days! She will occasionally drink soda-Sprite  Max daily intake 2 x 20 oz Sprite/day Nutrition 240cal/64g CHO per 20 oz of Sprite  August 2025: one year of benefits with [place] She wishes to proceed with Bariatric Surgery Fall 2025 Stressed the importance of f/u with Hematology to stabilize known anemia  Exercise-NEAT Actitivites Hydration-Either plain water or Crystal Light with water   Of Note- Tubal Ligation > 5 years ago  Subjective:   1. Vitamin D  deficiency  Latest Reference Range & Units 09/21/21 07:53 03/06/22 13:08 08/29/22 07:49 01/31/23 11:55  Vitamin D , 25-Hydroxy 30.0 - 100.0 ng/mL 25.2 (L) 36.9 28.5 (L) 22.6 (L)  (L): Data is abnormally low  She is sporadically on bi-weekly Ergocalciferol   2. Insulin  resistance  Latest Reference Range & Units 09/21/21 07:53 03/06/22 13:08 08/29/22 07:49 01/31/23 11:55  INSULIN  2.6 - 24.9 uIU/mL 37.1 (H) 20.2 9.3 28.3 (H)  (H): Data is abnormally  high  [name] was started on loading dose Wegovy  0.25mg  on/about 08/29/2022  09/26/2022 Wegovy  increased to 0.5mg  Increased Wegvoy from 0.5mg  to 1mg  on/about 11/02/2022 Due to SE Wegovy  1mg  stopped late Dec 2024  Started on Metformin  XR 500mg  end of Jan 2025  03/06/2023 Metformin  XR 500mg  increased to 750mg  She denies GI upset  3. Gastroesophageal reflux disease, unspecified whether esophagitis present Her Gastroenterology OV was rescheduled from   4. Anxiety and depression At last OV with [facility] on 03/06/2023 Daily fluoxetine  was increased from 10mg  to 20mg  She reports decreased depression sx's She denies SI/HI  Assessment/Plan:   1. Vitamin D  deficiency Refill - Vitamin D , Ergocalciferol , (DRISDOL ) 1.25 MG (50000 UNIT) CAPS capsule; Take 1 capsule (50,000 Units total) by mouth every 3 (three) days.  Dispense: 8 capsule; Refill: 0  2. Insulin  resistance (Primary) Refill  metFORMIN  (GLUCOPHAGE -XR) 750 MG 24 hr tablet Take 1 tablet (750 mg total) by mouth daily with supper. Dispense: 90 tablet, Refills: 0 of 0 remaining   Increase protein at meals  3. Gastroesophageal reflux disease, unspecified whether esophagitis present F/u with GI Avoid known trigger foods Do not eat too closely to bedtime  4. Anxiety and depression Continue daily DDRI Increase daily activity/exercise  5. Obesity, current BMI 49.5  [name] is currently in the action stage of change. As such, her goal is to continue with weight loss efforts. She has agreed to the Category 2 Plan.   Exercise goals: All adults should  avoid inactivity. Some physical activity is better than none, and adults who participate in any amount of physical activity gain some health benefits. Adults should also include muscle-strengthening activities that involve all major muscle groups on 2 or more days a week.  Behavioral modification strategies: increasing lean protein intake, decreasing simple carbohydrates, increasing vegetables,  increasing water intake, no skipping meals, meal planning and cooking strategies, keeping healthy foods in the home, ways to avoid boredom eating, and planning for success.  Malvina has agreed to follow-up with our clinic in 4 weeks. She was informed of the importance of frequent follow-up visits to maximize her success with intensive lifestyle modifications for her multiple health conditions.   Objective:   Blood pressure 135/84, pulse 66, temperature 98.4 F (36.9 C), height 5\' 2"  (1.575 m), weight 270 lb (122.5 kg), SpO2 99%. Body mass index is 49.38 kg/m.  General: Cooperative, alert, well developed, in no acute distress. HEENT: Conjunctivae and lids unremarkable. Cardiovascular: Regular rhythm.  Lungs: Normal work of breathing. Neurologic: No focal deficits.   Lab Results  Component Value Date   CREATININE 0.72 01/31/2023   BUN 11 01/31/2023   NA 137 01/31/2023   K 4.6 01/31/2023   CL 99 01/31/2023   CO2 21 01/31/2023   Lab Results  Component Value Date   ALT 22 01/31/2023   AST 14 01/31/2023   ALKPHOS 94 01/31/2023   BILITOT 0.3 01/31/2023   Lab Results  Component Value Date   HGBA1C 5.7 (H) 01/31/2023   HGBA1C 5.2 08/29/2022   HGBA1C 5.5 03/06/2022   HGBA1C 5.6 09/21/2021   HGBA1C 5.6 02/04/2021   Lab Results  Component Value Date   INSULIN  28.3 (H) 01/31/2023   INSULIN  9.3 08/29/2022   INSULIN  20.2 03/06/2022   INSULIN  37.1 (H) 09/21/2021   INSULIN  17.6 02/04/2021   Lab Results  Component Value Date   TSH 0.731 01/31/2023   Lab Results  Component Value Date   CHOL 155 09/16/2020   HDL 46 09/16/2020   LDLCALC 97 09/16/2020   TRIG 62 09/16/2020   Lab Results  Component Value Date   VD25OH 22.6 (L) 01/31/2023   VD25OH 28.5 (L) 08/29/2022   VD25OH 36.9 03/06/2022   Lab Results  Component Value Date   WBC 9.6 12/07/2022   HGB 12.8 12/07/2022   HCT 39.0 12/07/2022   MCV 93.5 12/07/2022   PLT 372 12/07/2022   Lab Results  Component Value Date    IRON  11 (L) 05/25/2022   TIBC 475 (H) 05/25/2022   FERRITIN 8 (L) 05/25/2022   Attestation Statements:   Reviewed by clinician on day of visit: allergies, medications, problem list, medical history, surgical history, family history, social history, and previous encounter notes.  I have reviewed the above documentation for accuracy and completeness, and I agree with the above. -  Gianina Olinde d. Garyn Waguespack, NP-C

## 2023-05-04 ENCOUNTER — Ambulatory Visit (INDEPENDENT_AMBULATORY_CARE_PROVIDER_SITE_OTHER): Payer: Self-pay

## 2023-05-04 DIAGNOSIS — J309 Allergic rhinitis, unspecified: Secondary | ICD-10-CM | POA: Diagnosis not present

## 2023-05-07 NOTE — Progress Notes (Signed)
 [facility] OFFICE PROGRESS NOTE  Abram Abraham, NP-C [address] [place] [address]  DIAGNOSIS: Persistent iron  deficiency anemia secondary to menorrhagia. The patient also has thrombocytosis that is reactive in nature secondary to the iron  deficiency. She is unable to correct her iron  deficiency with the oral supplements   PRIOR THERAPY: None  CURRENT THERAPY: 1) IV iron  PRN, the most recent with feraheme  on 06/16/22. She has intolerance to venofer  2) Folic acid  1 mg p.o. daily 3) Oral iron  supplements recently resumed this earlier this week   INTERVAL HISTORY: Debra Horn 38 y.o. female returns returns to the clinic today for a follow-up visit. The patient was last seen in the clinic about 1 year ago. She was lost to follow up since that time. She was seen for iron  deficiency anemia secondary to menorrhagia. She also was found to have folic acid  deficiency and Dr. Marguerita Shih prescribed folic acid  1 mg p.o. daily. She has not been taking this. She recently resumed her iron  supplement earlier this week. She does not recall having intolerance. He also arrange for IV iron  last year with Venofer  but she had a reaction after 1 cycle did not have any further infusions at that time. She reports spiking a fever and having hallucinations. She did not have any premedications per chart review. She therefore received feraheme  most recent on 06/16/23 and she did not have any adverse reactions.    When she was seen last year, she was being considered for bariatric surgery by  [facility] surgery by Dr. Jamse Mcgee. She is back today so she can ensure her labs are optimal to be considered for surgery. She is followed by the [facility]. She make dietary changes and portion control. She has not lost weight but she feels so much better.     Overall, the patient states that she feels good.  Her energy was good since cutting out soda and sugar.  Denies any  dizziness.  She can walk farther.   She still has heavy menstrual cycles intermittently. She states sometimes they are light. She is not able to take birth control due to her history of pulmonary embolism from birth control. She denies any other abnormal bleeding or bruising.  She is here today for evaluation and repeat blood work.  MEDICAL HISTORY: Past Medical History:  Diagnosis Date   Anemia    low iron    Asthma    Complication of anesthesia    bp dropped in middle of last c-section and started with n/v   GERD (gastroesophageal reflux disease)    during pregnancy   Headache    "couple days/week" (10/28/2015)   Hyperthyroidism    not on medication   PE (pulmonary embolism) 11/2011   PONV (postoperative nausea and vomiting)    Pregnancy induced hypertension    Preterm labor    Sleep apnea    SOB (shortness of breath)    Thyroid  condition    Vitamin D  deficiency     ALLERGIES:  is allergic to banana and cefixime.  MEDICATIONS:  Current Outpatient Medications  Medication Sig Dispense Refill   albuterol  (VENTOLIN  HFA) 108 (90 Base) MCG/ACT inhaler Inhale 2 puffs into the lungs every 6 (six) hours as needed for wheezing or shortness of breath. 18 g 1   Budeson-Glycopyrrol-Formoterol  (BREZTRI  AEROSPHERE) 160-9-4.8 MCG/ACT AERO Inhale 2 puffs into the lungs in the morning and at bedtime. 10.7 g 5   EPINEPHrine  0.3 mg/0.3  mL IJ SOAJ injection Inject 0.3 mg into the muscle as needed for anaphylaxis. 1 each 1   famotidine  (PEPCID ) 40 MG tablet Take 1 tablet (40 mg total) by mouth daily. 90 tablet 1   FLUoxetine  (PROZAC ) 20 MG capsule Take 1 capsule (20 mg total) by mouth daily. 90 capsule 0   fluticasone  (FLOVENT  HFA) 110 MCG/ACT inhaler Inhale 2 puffs into the lungs in the morning and at bedtime. 12 g 5   levocetirizine (XYZAL ) 5 MG tablet Take 1 tablet (5 mg total) by mouth every evening. 30 tablet 5   metFORMIN  (GLUCOPHAGE -XR) 750 MG 24 hr tablet Take 1 tablet (750 mg total) by  mouth daily with supper. 90 tablet 0   metroNIDAZOLE  (METROGEL ) 0.75 % vaginal gel Place vaginally 2 (two) times daily.     Olopatadine-Mometasone (RYALTRIS ) 665-25 MCG/ACT SUSP Place 2 sprays into the nose 2 (two) times daily as needed. 29 g 5   pantoprazole  (PROTONIX ) 40 MG tablet Take 1 tablet (40 mg total) by mouth daily. 30 tablet 5   predniSONE  (DELTASONE ) 10 MG tablet Take one tablet (10mg ) twice daily for six days, then one tablet (10mg ) once daily for six days, then STOP. 18 tablet 0   Vitamin D , Ergocalciferol , (DRISDOL ) 1.25 MG (50000 UNIT) CAPS capsule Take 1 capsule (50,000 Units total) by mouth every 3 (three) days. 8 capsule 0   No current facility-administered medications for this visit.    SURGICAL HISTORY:  Past Surgical History:  Procedure Laterality Date   CESAREAN SECTION  2008; 2014   CESAREAN SECTION N/A 07/27/2017   Procedure: REPEAT CESAREAN SECTION;  Surgeon: Granville Layer, MD;  Location: [facility] BIRTHING SUITES;  Service: Obstetrics;  Laterality: N/A;   FRACTURE SURGERY     ORIF ANKLE FRACTURE Right 10/28/2015   ORIF ANKLE FRACTURE Right 10/28/2015   Procedure: OPEN REDUCTION INTERNAL FIXATION (ORIF) RIGHT ANKLE FRACTURE;  Surgeon: Janeth Medicus, MD;  Location: [facility] OR;  Service: Orthopedics;  Laterality: Right;   TUBAL LIGATION Bilateral 07/27/2017   Procedure: BILATERAL TUBAL LIGATION;  Surgeon: Granville Layer, MD;  Location: [facility] BIRTHING SUITES;  Service: Obstetrics;  Laterality: Bilateral;    REVIEW OF SYSTEMS:   Review of Systems  Constitutional: Improved fatigue. Negative for appetite change, chills, fever and unexpected weight change.  HENT: Negative for mouth sores, nosebleeds, sore throat and trouble swallowing.   Eyes: Negative for eye problems and icterus.  Respiratory: Negative for cough, hemoptysis, shortness of breath and wheezing.   Cardiovascular: Negative for chest pain and leg swelling.  Gastrointestinal: Negative for abdominal pain,  constipation, diarrhea, nausea and vomiting.  Genitourinary: Negative for bladder incontinence, difficulty urinating, dysuria, frequency and hematuria.   Musculoskeletal: Negative for back pain, gait problem, neck pain and neck stiffness.  Skin: Negative for itching and rash.  Neurological: Negative for dizziness, extremity weakness, gait problem, headaches, light-headedness and seizures.  Hematological: Negative for adenopathy. Does not bruise/bleed easily.  Psychiatric/Behavioral: Negative for confusion, depression and sleep disturbance. The patient is not nervous/anxious.     PHYSICAL EXAMINATION:  There were no vitals taken for this visit.  ECOG PERFORMANCE STATUS: 0  Physical Exam  Constitutional: Oriented to person, place, and time and well-developed, well-nourished, and in no distress. HENT:  Head: Normocephalic and atraumatic.  Mouth/Throat: Oropharynx is clear and moist. No oropharyngeal exudate.  Eyes: Conjunctivae are normal. Right eye exhibits no discharge. Left eye exhibits no discharge. No scleral icterus.  Neck: Normal range of motion. Neck supple.  Cardiovascular:  Normal rate, regular rhythm, normal heart sounds and intact distal pulses.   Pulmonary/Chest: Effort normal and breath sounds normal. No respiratory distress. No wheezes. No rales.  Abdominal: Soft. Bowel sounds are normal. Exhibits no distension and no mass. There is no tenderness.  Musculoskeletal: Normal range of motion. Exhibits no edema.  Lymphadenopathy:    No cervical adenopathy.  Neurological: Alert and oriented to person, place, and time. Exhibits normal muscle tone. Gait normal. Coordination normal.  Skin: Skin is warm and dry. No rash noted. Not diaphoretic. No erythema. No pallor.  Psychiatric: Mood, memory and judgment normal.  Vitals reviewed.  LABORATORY DATA: Lab Results  Component Value Date   WBC 9.6 12/07/2022   HGB 12.8 12/07/2022   HCT 39.0 12/07/2022   MCV 93.5 12/07/2022   PLT  372 12/07/2022      Chemistry      Component Value Date/Time   NA 137 01/31/2023 1155   K 4.6 01/31/2023 1155   CL 99 01/31/2023 1155   CO2 21 01/31/2023 1155   BUN 11 01/31/2023 1155   CREATININE 0.72 01/31/2023 1155   CREATININE 0.76 03/07/2021 1130      Component Value Date/Time   CALCIUM 9.2 01/31/2023 1155   ALKPHOS 94 01/31/2023 1155   AST 14 01/31/2023 1155   AST 11 (L) 03/07/2021 1130   ALT 22 01/31/2023 1155   ALT 12 03/07/2021 1130   BILITOT 0.3 01/31/2023 1155   BILITOT 0.3 03/07/2021 1130       RADIOGRAPHIC STUDIES:  No results found.   ASSESSMENT/PLAN:  This is a very pleasant 38 year old African-American female with iron  deficiency anemia secondary to menorrhagia.  She also has reactive thrombocytosis secondary to her iron  deficiency.   The patient was seen by Dr. Marguerita Shih in 2023.  She received 1 dose IV iron  but had an allergic reaction.  She did not receive any premedications.  She was treated with feraheme  in June 2024 and tolerated it well.    The patient had repeat lab studies today including CBC, iron  studies, ferritin, and folic acid .  Her labs from today demonstrate normal Hbg of 12.0 Her other labs are pending.    I will send her a mychart message with the results.   I do not think she will require IV iron  but if she does have significant iron  deficiency, we would consider it. If she ever needs iron , I would recommend Feraheme  with premedications with Tylenol  and Benadryl .   I also encouraged her to be compliant with her oral iron  supplement 1 tablet p.o. daily with vitamin C. I will refill her folic acid  if it is low, she is taking a bariatric multivitamin that may have folic acid  in it.   She would like her labs checked prior to bariatric surgery which she anticipates will be in mid to late auguest. Therefore we will check her labs in late July and optimize her labs if needed.   We will see her back for follow-up visit and repeat lab work in  2.5 months.  The patient was advised to call immediately if she has any concerning symptoms in the interval. The patient voices understanding of current disease status and treatment options and is in agreement with the current care plan. All questions were answered. The patient knows to call the clinic with any problems, questions or concerns. We can certainly see the patient much sooner if necessary    No orders of the defined types were placed in this encounter.  The total time spent in the appointment was 20-29 minutes  Eladio Dentremont L Arlynn Stare, PA-C 05/07/23

## 2023-05-10 DIAGNOSIS — R519 Headache, unspecified: Secondary | ICD-10-CM | POA: Diagnosis not present

## 2023-05-10 DIAGNOSIS — I2699 Other pulmonary embolism without acute cor pulmonale: Secondary | ICD-10-CM | POA: Diagnosis not present

## 2023-05-10 DIAGNOSIS — E88819 Insulin resistance, unspecified: Secondary | ICD-10-CM | POA: Diagnosis not present

## 2023-05-10 DIAGNOSIS — E0590 Thyrotoxicosis, unspecified without thyrotoxic crisis or storm: Secondary | ICD-10-CM | POA: Diagnosis not present

## 2023-05-10 DIAGNOSIS — Z9889 Other specified postprocedural states: Secondary | ICD-10-CM | POA: Diagnosis not present

## 2023-05-10 DIAGNOSIS — J45909 Unspecified asthma, uncomplicated: Secondary | ICD-10-CM | POA: Diagnosis not present

## 2023-05-10 DIAGNOSIS — E559 Vitamin D deficiency, unspecified: Secondary | ICD-10-CM | POA: Diagnosis not present

## 2023-05-10 DIAGNOSIS — D649 Anemia, unspecified: Secondary | ICD-10-CM | POA: Diagnosis not present

## 2023-05-10 DIAGNOSIS — K219 Gastro-esophageal reflux disease without esophagitis: Secondary | ICD-10-CM | POA: Diagnosis not present

## 2023-05-10 DIAGNOSIS — R112 Nausea with vomiting, unspecified: Secondary | ICD-10-CM | POA: Diagnosis not present

## 2023-05-10 DIAGNOSIS — G4733 Obstructive sleep apnea (adult) (pediatric): Secondary | ICD-10-CM | POA: Diagnosis not present

## 2023-05-11 ENCOUNTER — Encounter: Payer: Self-pay | Admitting: Physician Assistant

## 2023-05-11 ENCOUNTER — Other Ambulatory Visit: Payer: Self-pay | Admitting: Physician Assistant

## 2023-05-11 ENCOUNTER — Other Ambulatory Visit (HOSPITAL_COMMUNITY): Payer: Self-pay

## 2023-05-11 ENCOUNTER — Inpatient Hospital Stay (HOSPITAL_BASED_OUTPATIENT_CLINIC_OR_DEPARTMENT_OTHER): Admitting: Physician Assistant

## 2023-05-11 ENCOUNTER — Inpatient Hospital Stay: Attending: Physician Assistant

## 2023-05-11 VITALS — BP 139/82 | HR 77 | Temp 98.3°F | Resp 18 | Wt 274.9 lb

## 2023-05-11 DIAGNOSIS — E538 Deficiency of other specified B group vitamins: Secondary | ICD-10-CM | POA: Diagnosis not present

## 2023-05-11 DIAGNOSIS — D75838 Other thrombocytosis: Secondary | ICD-10-CM | POA: Diagnosis not present

## 2023-05-11 DIAGNOSIS — Z86711 Personal history of pulmonary embolism: Secondary | ICD-10-CM | POA: Diagnosis not present

## 2023-05-11 DIAGNOSIS — D649 Anemia, unspecified: Secondary | ICD-10-CM | POA: Insufficient documentation

## 2023-05-11 DIAGNOSIS — D509 Iron deficiency anemia, unspecified: Secondary | ICD-10-CM

## 2023-05-11 DIAGNOSIS — D500 Iron deficiency anemia secondary to blood loss (chronic): Secondary | ICD-10-CM | POA: Diagnosis not present

## 2023-05-11 DIAGNOSIS — N920 Excessive and frequent menstruation with regular cycle: Secondary | ICD-10-CM | POA: Insufficient documentation

## 2023-05-11 LAB — CBC WITH DIFFERENTIAL (CANCER CENTER ONLY)
Abs Immature Granulocytes: 0.03 10*3/uL (ref 0.00–0.07)
Basophils Absolute: 0 10*3/uL (ref 0.0–0.1)
Basophils Relative: 1 %
Eosinophils Absolute: 0.1 10*3/uL (ref 0.0–0.5)
Eosinophils Relative: 1 %
HCT: 36.9 % (ref 36.0–46.0)
Hemoglobin: 12 g/dL (ref 12.0–15.0)
Immature Granulocytes: 0 %
Lymphocytes Relative: 48 %
Lymphs Abs: 4.1 10*3/uL — ABNORMAL HIGH (ref 0.7–4.0)
MCH: 29.1 pg (ref 26.0–34.0)
MCHC: 32.5 g/dL (ref 30.0–36.0)
MCV: 89.3 fL (ref 80.0–100.0)
Monocytes Absolute: 0.7 10*3/uL (ref 0.1–1.0)
Monocytes Relative: 8 %
Neutro Abs: 3.7 10*3/uL (ref 1.7–7.7)
Neutrophils Relative %: 42 %
Platelet Count: 219 10*3/uL (ref 150–400)
RBC: 4.13 MIL/uL (ref 3.87–5.11)
RDW: 14.4 % (ref 11.5–15.5)
WBC Count: 8.7 10*3/uL (ref 4.0–10.5)
nRBC: 0 % (ref 0.0–0.2)

## 2023-05-11 LAB — FOLATE: Folate: 5.2 ng/mL — ABNORMAL LOW (ref >5.9)

## 2023-05-11 LAB — FERRITIN: Ferritin: 16 ng/mL (ref 11–307)

## 2023-05-11 LAB — IRON AND IRON BINDING CAPACITY (CC-WL,HP ONLY)
Iron: 31 ug/dL (ref 28–170)
Saturation Ratios: 8 % — ABNORMAL LOW (ref 10.4–31.8)
TIBC: 409 ug/dL (ref 250–450)
UIBC: 378 ug/dL (ref 148–442)

## 2023-05-11 MED ORDER — FOLIC ACID 1 MG PO TABS: 1.0000 mg | ORAL_TABLET | Freq: Every day | ORAL | 2 refills | Status: DC

## 2023-05-14 ENCOUNTER — Other Ambulatory Visit (HOSPITAL_COMMUNITY): Payer: Self-pay

## 2023-05-25 ENCOUNTER — Encounter: Attending: Surgery | Admitting: Dietician

## 2023-05-25 ENCOUNTER — Encounter: Payer: Self-pay | Admitting: Dietician

## 2023-05-25 VITALS — Ht 62.0 in | Wt 279.9 lb

## 2023-05-25 DIAGNOSIS — E669 Obesity, unspecified: Secondary | ICD-10-CM | POA: Insufficient documentation

## 2023-05-25 NOTE — Progress Notes (Signed)
 Nutrition Assessment for Bariatric Surgery: Pre-Surgery Behavioral and Nutrition Intervention Program   Medical Nutrition Therapy  Appt Start Time: 0901    End Time: 1000  Patient was seen on 05/25/2023 for Pre-Operative Nutrition Assessment. Purpose of todays visit  enhance perioperative outcomes along with a healthy weight maintenance   Referral stated Supervised Weight Loss (SWL) visits needed: 0  Not cleared at this time:  Pt to follow up for minimum of one more visit to assist pt with progressing through stages of change/further nutrition education. RD advised pt that this follow up visit is not mandated through insurance. Pt verbalized agreement.  Planned surgery: Sleeve Gastrectomy Pt expectation of surgery: to be 165 lbs  NUTRITION ASSESSMENT   Anthropometrics  Start weight at [facility]: 279.9 lbs (date: 05/25/2023)  Height: 62 in BMI: 51.19 kg/m2     Clinical   Pharmacotherapy: History of weight loss medication used: metformin , Wegovy  (not tolerated)  Medical hx: sleep apnea, asthma, obesity Medications: albuterol  inhaler, epinephrine  injection, famotidine , pantoprazole , fluticasone , Pepcid  as needed  Labs: hemoglobin 10.3, hematocrit 33.9; LDL 116; iron  19; iron  saturation <5 Notable signs/symptoms: none noted Any previous deficiencies? No  Evaluation of Nutritional Deficiencies: Micronutrient Nutrition Focused Physical Exam: Hair: No issues observed Eyes: No issues observed Mouth: No issues observed Neck: No issues observed Nails: No issues observed Skin: No issues observed  Lifestyle & Dietary Hx  Pt states she know she needs to do the work, stating it is not just the surgery.  Pt states she has a special needs child, making things a little difficult (stressful). Pt states she skips breakfast and lunch, stating she chews a lot of gum and does not snack either. Pt states she cut out soda and juice 2 months ago.  Current Physical Activity Recommendations state 150  minutes per week of moderate to vigorous movement including Cardio and 1-2 days of resistance activities as well as flexibility/balance activities:  Pts current physical activity: ADLs walk 3 days per week for 30 minutes, with 60% recommendation reached   Sleep Hygiene: duration and quality: pt states she uses a CPAP machine; good sleep; about 5-6 hours per night  Current Patient Perceived Stress Level as stated by pt on a scale of 1-10:  5       Stress Management Techniques: just push through  According to the Dietary Guidelines for Americans Recommendation: equivalent 1.5-2 cups fruits per day, equivalent 2-3 cups vegetables per day and at least half all grains whole  Fruit servings per day (on average): 0-1, meeting 0-50% recommendation  Non-starchy vegetable servings per day (on average): 1-2, meeting 50-66% recommendation  Whole Grains per day (on average): 0  Number of meals missed/skipped per week out of 21: 14  24-Hr Dietary Recall First Meal: skip Snack:  Second Meal: skip Snack:  Third Meal: salad, chicken or fish or pizza or burger Snack:  Beverages: water, water with flavorings, green tea with honey  Alcoholic beverages per week: 0  Estimated Energy Needs Calories: 1500  NUTRITION DIAGNOSIS  Overweight/obesity (Russell-3.3) related to past poor dietary habits and physical inactivity as evidenced by patient w/ planned sleeve surgery following dietary guidelines for continued weight loss.  NUTRITION INTERVENTION  Nutrition counseling (C-1) and education (E-2) to facilitate bariatric surgery goals.  Educated pt on micronutrient deficiencies post-surgery and behavioral/dietary strategies to start in order to mitigate that risk   Behavioral and Dietary Interventions Pre-Op Goals Reviewed with the Patient Nutrition: Healthy Eating Behaviors Switch to non-caloric, non-carbonated and non-caffeinated beverages  such as  water, unsweetened tea, Crystal Light and zero calorie  beverages (aim for 64 oz. per day) Cut out grazing between meals or at night  Find a protein shake you like Eat every 3-5 hours        Eliminate distractions while eating (TV, computer, reading, driving, texting) Take 61-44 minutes to eat a meal  Decrease high sugar foods/decrease high fat/fried foods Eliminate alcoholic beverages Increase protein intake (eggs, fish, chicken, yogurt) before surgery Eat non starchy vegetables 2 times a day 7 days a week Eat complex carbohydrates such as whole grains and fruits   Behavioral Modification: Physical Activity Increase my usual daily activity (use stairs, park farther, etc.) Engage in _______________________  activity  _______ minutes ______ times per week  Other:    _________________________________________________________________     Problem Solving I will think about my usual eating patterns and how to tweak them How can my friends and family support me Barriers to starting my changes Learn and understand appetite verses hunger   Healthy Coping Allow for ___________ activities per week to help me manage stress Reframe negative thoughts I will keep a picture of someone or something that is my inspiration & look at it daily   Monitoring  Weigh myself once a week  Measure my progress by monitoring how my clothes fit Keep a food record of what I eat and drink for the next ________ (time period) Take pictures of what I eat and drink for the next ________ (time period) Use an app to count steps/day for the next_______ (time period) Measure my progress such as increased energy and more restful sleep Monitor your acid reflux and bowel habits, are they getting better?   *Goals that are bolded indicate the pt would like to start working towards these  Handouts Provided Include  Bariatric Surgery handouts (Nutrition Visits, Pre Surgery Behavioral Change Goals, Protein Shakes Brands to Choose From, Vitamins & Mineral  Supplementation)  Learning Style & Readiness for Change Teaching method utilized: Visual, Auditory, and hands on  Demonstrated degree of understanding via: Teach Back  Readiness Level: contemplative Barriers to learning/adherence to lifestyle change: habit of skipping meals  RD's Notes for Next Visit Patient progress toward chosen goals   MONITORING & EVALUATION Dietary intake, weekly physical activity, body weight, and preoperative behavioral change goals   Next Steps  Pt to follow up in 3 weeks for minimum of one more visit to assist pt with progressing through stages of change/further nutrition education.

## 2023-05-28 ENCOUNTER — Other Ambulatory Visit: Payer: Self-pay | Admitting: Allergy & Immunology

## 2023-05-29 ENCOUNTER — Telehealth: Admitting: Family Medicine

## 2023-05-29 DIAGNOSIS — J4541 Moderate persistent asthma with (acute) exacerbation: Secondary | ICD-10-CM

## 2023-05-29 MED ORDER — ALBUTEROL SULFATE HFA 108 (90 BASE) MCG/ACT IN AERS: 2.0000 | INHALATION_SPRAY | Freq: Four times a day (QID) | RESPIRATORY_TRACT | 0 refills | Status: DC | PRN

## 2023-05-29 MED ORDER — PREDNISONE 20 MG PO TABS: 20.0000 mg | ORAL_TABLET | Freq: Two times a day (BID) | ORAL | 0 refills | Status: AC

## 2023-05-29 NOTE — Progress Notes (Signed)

## 2023-06-01 ENCOUNTER — Encounter: Payer: Self-pay | Admitting: Physician Assistant

## 2023-06-05 ENCOUNTER — Ambulatory Visit (INDEPENDENT_AMBULATORY_CARE_PROVIDER_SITE_OTHER): Payer: Self-pay | Admitting: Licensed Clinical Social Worker

## 2023-06-05 DIAGNOSIS — F411 Generalized anxiety disorder: Secondary | ICD-10-CM

## 2023-06-05 NOTE — Progress Notes (Unsigned)
 Comprehensive Clinical Assessment (CCA) Note  06/05/2023 Debra Horn [id number]  Chief Complaint:  Chief Complaint  Patient presents with   Obesity   Visit Diagnosis: Generalized anxiety disorder     CCA Biopsychosocial Intake/Chief Complaint:  Bariatric  Current Symptoms/Problems: feels tired and lack of motivation at times, over extends self, difficulty with large crowds at times, sleep apnea, no SI/HI, no psychosis   Patient Reported Schizophrenia/Schizoaffective Diagnosis in Past: No   Strengths: hard worker, good listener,  Preferences: doesn't prefer large crowds, prefers time to herself, prefers the outdoors, doesn't prefer drama or conflict, prefers to stay busy  Abilities: hard worker   Type of Services Patient Feels are Needed: Bariatric surgery   Initial Clinical Notes/Concerns: History of weight: After children, her weight started to increase,  Weight loss attempts: walking, wegovy , metformin , intermittent fasting, weight watchers, Atkins, cabbage soup,  Current diet: No soda, increasing water intake, working on reducing sweets, reducing carbs,   Co-morbid diagnosis: sleep apnea, insillin resistant, asthma, allergies,  Previous procedures: 2017-broke leg that involved surgery, C-sections for each child, recovered well,   Family history: none   Mental Health Symptoms Depression:  Change in energy/activity   Duration of Depressive symptoms: Greater than two weeks   Mania:  None   Anxiety:   Worrying   Psychosis:  None   Duration of Psychotic symptoms: No data recorded  Trauma:  None   Obsessions:  None   Compulsions:  None   Inattention:  None   Hyperactivity/Impulsivity:  None   Oppositional/Defiant Behaviors:  None   Emotional Irregularity:  None   Other Mood/Personality Symptoms:  None    Mental Status Exam Appearance and self-care  Stature:  Average   Weight:  Obese   Clothing:  Casual   Grooming:  Normal   Cosmetic use:   None   Posture/gait:  Normal   Motor activity:  Not Remarkable   Sensorium  Attention:  Normal   Concentration:  Normal   Orientation:  X5   Recall/memory:  Normal   Affect and Mood  Affect:  Appropriate   Mood:  Euthymic   Relating  Eye contact:  Normal   Facial expression:  Responsive   Attitude toward examiner:  Cooperative   Thought and Language  Speech flow: Normal   Thought content:  Appropriate to Mood and Circumstances   Preoccupation:  None   Hallucinations:  None   Organization:  No data recorded  [employer] of Knowledge:  Good   Intelligence:  Average   Abstraction:  Normal   Judgement:  Good   Reality Testing:  Realistic   Insight:  Good   Decision Making:  Normal   Social Functioning  Social Maturity:  Responsible   Social Judgement:  Normal   Stress  Stressors:  Transitions   Coping Ability:  Normal   Skill Deficits:  None   Supports:  Family     Religion: Religion/Spirituality Are You A Religious Person?: Yes What is Your Religious Affiliation?: Christian How Might This Affect Treatment?: Support in treatment  Leisure/Recreation: Leisure / Recreation Do You Have Hobbies?: Yes Leisure and Hobbies: Read: urban fiction, black romance  Exercise/Diet: Exercise/Diet Do You Exercise?: Yes What Type of Exercise Do You Do?: Run/Walk How Many Times a Week Do You Exercise?: 1-3 times a week Have You Gained or Lost A Significant Amount of Weight in the Past Six Months?: Yes-Gained Number of Pounds Gained: 15 Do You Follow a Special Diet?: Yes Type  of Diet: see above Do You Have Any Trouble Sleeping?: Yes Explanation of Sleeping Difficulties: Sleep apnea, trouble falling and staying asleep   CCA Employment/Education Employment/Work Situation: Employment / Work Situation Employment Situation: Employed Where is Patient Currently Employed?: [employer] How Long has Patient Been Employed?: almost a year Are  You Satisfied With Your Job?: Yes Do You Work More Than One Job?: No Work Stressors: none Patient's Job has Been Impacted by Current Illness: No What is the Longest Time Patient has Held a Job?: 7 years Where was the Patient Employed at that Time?: worked at [employer] Has Patient ever Been in Equities trader?: No  Education: Education Is Patient Currently Attending School?: No Last Grade Completed: 12 Name of High School: [employer] Did Garment/textile technologist From [employer]?: Yes Did Theme park manager?:  (Some college) Did Designer, television/film set?: No Did You Have Any Special Interests In School?: None Did You Have An Individualized Education Program (IIEP): No Did You Have Any Difficulty At School?: No Patient's Education Has Been Impacted by Current Illness: No   CCA Family/Childhood History Family and Relationship History: Family history Marital status: Married Number of Years Married: 15 (Husband: Sam) What types of issues is patient dealing with in the relationship?: None, Additional relationship information: None Are you sexually active?: Yes What is your sexual orientation?: Heterosexual Has your sexual activity been affected by drugs, alcohol, medication, or emotional stress?: emotional stress Does patient have children?: Yes How many children?: 3 How is patient's relationship with their children?: 2 sons, 1 daughter: Emeline Hanks, Dufm Gibbon, good relationships with sons, pretty good with daughter  Childhood History:  Childhood History By whom was/is the patient raised?: Mother Additional childhood history information: Mother was in the home. Biological father was in another state and not involved, Patient describes childhood as "great." Description of patient's relationship with caregiver when they were a child: Mother: worked at lot, good, Father: no relationship Patient's description of current relationship with people who raised him/her: Mother: close, Father: very  limited How were you disciplined when you got in trouble as a child/adolescent?: spanked occasionally, yelled at occasionally Does patient have siblings?: No Did patient suffer any verbal/emotional/physical/sexual abuse as a child?: No Did patient suffer from severe childhood neglect?: No Has patient ever been sexually abused/assaulted/raped as an adolescent or adult?: No Was the patient ever a victim of a crime or a disaster?: No Witnessed domestic violence?: No Has patient been affected by domestic violence as an adult?: No  Child/Adolescent Assessment:     CCA Substance Use Alcohol/Drug Use: Alcohol / Drug Use Pain Medications: See patient MAR Prescriptions: See patient MAR Over the Counter: See patient MAR History of alcohol / drug use?: No history of alcohol / drug abuse                         ASAM's:  Six Dimensions of Multidimensional Assessment  Dimension 1:  Acute Intoxication and/or Withdrawal Potential:   Dimension 1:  Description of individual's past and current experiences of substance use and withdrawal: None  Dimension 2:  Biomedical Conditions and Complications:   Dimension 2:  Description of patient's biomedical conditions and  complications: None  Dimension 3:  Emotional, Behavioral, or Cognitive Conditions and Complications:  Dimension 3:  Description of emotional, behavioral, or cognitive conditions and complications: None  Dimension 4:  Readiness to Change:  Dimension 4:  Description of Readiness to Change criteria: None  Dimension 5:  Relapse,  Continued use, or Continued Problem Potential:  Dimension 5:  Relapse, continued use, or continued problem potential critiera description: None  Dimension 6:  Recovery/Living Environment:  Dimension 6:  Recovery/Iiving environment criteria description: None  ASAM Severity Score: ASAM's Severity Rating Score: 0  ASAM Recommended Level of Treatment:     Substance use Disorder (SUD)    Recommendations for  Services/Supports/Treatments: Recommendations for Services/Supports/Treatments Recommendations For Services/Supports/Treatments: Other (Comment) (Bariatric)  DSM5 Diagnoses: Patient Active Problem List   Diagnosis Date Noted   Anemia 05/11/2023   Iron  deficiency anemia 05/25/2022   Shortness of breath 08/22/2021   Moderate persistent asthma with exacerbation 07/28/2021   Diffuse pain 05/31/2021   Allergic reaction 05/11/2021   Iron  deficiency anemia due to chronic blood loss 03/07/2021   Low folate 03/07/2021   Class 3 severe obesity with serious comorbidity and body mass index (BMI) of 40.0 to 44.9 in adult 03/03/2021   CRP elevated 12/01/2020   Elevated sed rate 12/01/2020   Nasal drainage 11/09/2020   Insulin  resistance 10/26/2020   Other chest pain 10/13/2020   Severe sleep apnea 06/24/2020   BMI 45.0-49.9, adult (HCC) 06/24/2020   Status post repeat low transverse cesarean section 07/27/2017   Poor fetal growth affecting management of mother in third trimester    Previous pregnancy complicated by pregnancy-induced hypertension in third trimester, antepartum    IUGR (intrauterine growth restriction) in prior pregnancy, pregnant 03/06/2017   Vitamin D  deficiency 02/17/2017   History of preterm delivery 02/09/2017   History of asthma    Normocytic anemia 02/06/2016   Family history of systemic lupus erythematosus (SLE) in mother 02/19/2014   Hyperthyroidism 02/19/2014   Previous cesarean section 08/08/2012   History of pulmonary embolus (PE) 07/24/2012    Patient Centered Plan: Patient is on the following Treatment Plan(s):  No treatment plan needed  Behavioral Health Assessment Patient Name FREDDIE NGHIEM Date of Birth April 21, 1985  Age 76 y.o.  Date of Interview 06/05/2023   Gender female  Date of Report 06.04.2025  Purpose Bariatric/Weight-loss Surgery (pre-operative evaluation)     Assessment Instruments:  DSM-5-TR Self-Rated Level 1 Cross-Cutting Symptom  Measure--Adult Severity Measure for Generalized Anxiety Disorder--Adult EAT-26  Chief Complain: Obesity  Client Background: Patient is a 38 y.o. African American female seeking weight loss surgery. Patient has a highschool degree and currently works at [employer].  Patient is married and has 3 children. The patient is 5 feet 2 inches tall and 279 lbs., placing her at a BMI of 51 classifying her in the obese range and at further risk of co-morbid diseases.  Weight History:  Patient's weight gradually increased after she started to have children. She has tried walking, wegovy  and other weight loss medications, intermittent fasting, weight watchers, Atkins, and fade diets with limit success.   Eating Patterns:  Patient is working on her eating. She denies bingeing or emotional eating. She is focused on water intake and she has cut out soda. Patient is eating low carb.   Related Medical Issues:   Patient has sleep apnea, and is insulin  resistant. She had a broken leg in 2017 that required surgery and 3 C-sections. She recovered well from each procedure.    Family History of Obesity:  Patient has no family history of obesity.   Tobacco Use: Patient denies tobacco use.   PATIENT BEHAVIORAL ASSESSMENT SCORES  Personal History of Mental Illness: Patient admits to treatment for depression and anxiety in the past including outpatient therapy and medication management. Patient  denies hospitalization.   Mental Status Examination: Patient was oriented x5 (person, place, situation, time, and object). She was appropriately groomed, and neatly dressed. Patient was alert, engaged, pleasant, and cooperative. Patient denies suicidal and homicidal ideations. Patient denies self-injury. Patient denies psychosis including auditory and visual hallucinations  DSM-5-TR Self-Rated Level 1 Cross-Cutting Symptom Measure--Adult:  Patient rated herself a 1 indicating slight, less than a day or two, on the Depression  domain question: "little interest or pleasure in doing things." Patient noted that she has been tired with work, having a special needs child, and sleep apnea. Patient rated herself a two indicating mild, several days, on the Anxiety domain question: "Avoiding situations that make you anxious?" She notes that there are times she will avoid situations with a lot of people but she is also able to go to festivals or events with a lot of people.   Severity Measure for Generalized Anxiety Disorder--Adult: Patient completed a 10-question scale. Total scores can range from 0 to 40. A raw score is calculated by summing the answer to each question, and an average total score is achieved by dividing the raw score by the number of items (e.g., 10). Patient had a total raw score of 3 out of 40 which was divided by the total number of questions answered (10) to get an average score of .3 which indicates minimal or no clinically significant anxiety.   EAT-26: The EAT-26 is a twenty-six-question screening tool to identify symptoms of eating disorders and disordered eating. The patient scored 7 out of 26. Scores below a 20 are considered not meeting criteria for disordered eating. Patient denies inducing vomiting, or intentional meal skipping. Patient denies binge eating behaviors. Patient denies laxative abuse. Patient does not meet criteria for a DSM-V eating disorder.  Conclusion & Recommendations:   Debra Horn health history and current assessment indicate that she is suitable for bariatric surgery. Patient understands the procedure, the risks associated with it, and the importance of post-operative holistic care (Physical, Spiritual/Values, Relationships, and Mental/Emotional health) with access to resources for support as needed. The patient has made an informed decision to proceed with the procedure. The patient is motivated and expressed understanding of the post-surgical requirements. Patient's psychological  assessment will be valid from today's date for 6 months (12.03.2025). Then, a follow-up appointment will be needed to re-evaluate the patient's psychological status.   I see no significant psychological factors that would hinder the success of bariatric surgery. I support Debra Horn desire for Bariatric Surgery.   Braxton Calico, LCSW    Referrals to Alternative Service(s): Referred to Alternative Service(s):   Place:   Date:   Time:    Referred to Alternative Service(s):   Place:   Date:   Time:    Referred to Alternative Service(s):   Place:   Date:   Time:    Referred to Alternative Service(s):   Place:   Date:   Time:      Collaboration of Care: Other provider involved in patient's care [facility]  Patient/Guardian was advised Release of Information must be obtained prior to any record release in order to collaborate their care with an outside provider. Patient/Guardian was advised if they have not already done so to contact the registration department to sign all necessary forms in order for us  to release information regarding their care.   Consent: Patient/Guardian gives verbal consent for treatment and assignment of benefits for services provided during this visit. Patient/Guardian expressed understanding  and agreed to proceed.   Braxton Calico, LCSW

## 2023-06-07 ENCOUNTER — Encounter (INDEPENDENT_AMBULATORY_CARE_PROVIDER_SITE_OTHER): Payer: Self-pay | Admitting: Adult Health

## 2023-06-07 ENCOUNTER — Ambulatory Visit (INDEPENDENT_AMBULATORY_CARE_PROVIDER_SITE_OTHER): Admitting: Adult Health

## 2023-06-07 DIAGNOSIS — E559 Vitamin D deficiency, unspecified: Secondary | ICD-10-CM | POA: Diagnosis not present

## 2023-06-07 DIAGNOSIS — Z9884 Bariatric surgery status: Secondary | ICD-10-CM | POA: Diagnosis not present

## 2023-06-07 DIAGNOSIS — Z6841 Body Mass Index (BMI) 40.0 and over, adult: Secondary | ICD-10-CM

## 2023-06-07 DIAGNOSIS — R09A2 Foreign body sensation, throat: Secondary | ICD-10-CM

## 2023-06-07 DIAGNOSIS — E88819 Insulin resistance, unspecified: Secondary | ICD-10-CM | POA: Diagnosis not present

## 2023-06-07 DIAGNOSIS — E669 Obesity, unspecified: Secondary | ICD-10-CM

## 2023-06-07 MED ORDER — VITAMIN D (ERGOCALCIFEROL) 1.25 MG (50000 UNIT) PO CAPS: 50000.0000 [IU] | ORAL_CAPSULE | ORAL | 0 refills | Status: DC

## 2023-06-07 NOTE — Progress Notes (Addendum)
 WEIGHT SUMMARY AND BIOMETRICS  Vitals Temp: 99.2 F (37.3 C) BP: 116/75 Pulse Rate: (!) 105 SpO2: 93 %   Anthropometric Measurements Height: 5\' 2"  (1.575 m) Weight: 271 lb (122.9 kg) BMI (Calculated): 49.55 Weight at Last Visit: 270 lb Weight Lost Since Last Visit: 0 Weight Gained Since Last Visit: 1 lb Starting Weight: 244 Total Weight Loss (lbs): 0 lb (0 kg) Peak Weight: 273 lb   Body Composition  Body Fat %: 53 % Fat Mass (lbs): 144 lbs Muscle Mass (lbs): 121.4 lbs Total Body Water (lbs): 91 lbs Visceral Fat Rating : 18   Other Clinical Data Fasting: no Labs: no Today's Visit #: 31 Starting Date: 09/15/20    Chief Complaint:   OBESITY Debra Horn is here to discuss her progress with her obesity treatment plan.  She is on the the Category 2 Plan and states she is following her eating plan approximately 75 % of the time.  She states she is exercising Walking 30 minutes 3 times per week.  Interim History:  [facility]- Planned surgery: Sleeve Gastrectomy Pt expectation of surgery: to be 165 lbs  Anticipated surgery date August 2025 (after one year on insurance [employer] plan)  06/08/2023- she will complete full set of fasting labs per [facility] at [facility]  She is only hydrating with water and Crystal Light She is NOW completely refraining from all juice and soda (not even Sprite)  Reviewed Bioimpedance results with pt: Muscle Mass: +2.2 lbs Adipose Mass: -1.2 lbs  Subjective:   1. Insulin  resistance  Latest Reference Range & Units 03/06/22 13:08 08/29/22 07:49 01/31/23 11:55  INSULIN  2.6 - 24.9 uIU/mL 20.2 9.3 28.3 (H)  (H): Data is abnormally high  She stopped Metformin  XR 750mg  > 3 weeks ago She denies SE, simply did not want to take anymore Unable to tolerate Wegovy , re: worsening GERD and globus sensation  2. Globus sensation She currently denies any dyspepsia She has been off Pepcid  40mg  every day and Protonix  40mg  every day, > 2  weeks  3. Bariatric surgery status 05/10/2023 [facility] OV Notes: History of Present Illness:  Returns to pursue surgery for treatment of morbid obesity. She did have her upper GI last year which was normal, no reflux or hiatal hernia. Chest x-ray also normal at that time. Last visit with [facility] was last week, this was visit #30 and her weight was unchanged at 269 pounds. Had weaned off of soda and juice, occasional Sprite with a max intake of 2- 20oz servings per day. She had lab work done in the end of January this year including an unremarkable CMP; vitamin B12 is normal, D was low at 22.6 and this is being managed at [facility], hemoglobin A1c 5.7, insulin  elevated at 28.3, TSH/T4 normal, CBC in December 2024 unremarkable; hemoglobin 12.8. She remains committed to aggressive treatment of morbid obesity and remains interested in sleeve gastrectomy. She did try Wegovy  briefly over the course of the year but it did not work for her and she developed a globus sensation which does improve antireflux medications. No overt heartburn or reflux symptoms otherwise.  She is interested in sleeve gastrectomy, and remains a good candidate for this. She has new globus sensation which she has after she eats something she is not supposed to eat from for example cake, and this does improve with reflux medications. She had no hiatal hernia on upper GI last year but CT angio did note a small hiatal hernia  prior to this.  We reviewed the surgery including relevant anatomy, technical aspects, the risks of bleeding, infection, pain, scarring, injury to intra-abdominal structures, staple line/anastomotic leak or abscess, chronic abdominal pain or nausea, new onset or worsened GERD, constipation, malnutrition/nutrient deficiency, DVT/PE, pneumonia, heart attack, stroke, death, failure to reach weight loss goals and weight regain, hernia. Reviewed the typical pre-, peri-, and postoperative course. Discussed the  importance of lifelong behavioral changes to combat the chronic and relapsing disease which is obesity. Questions welcomed and answered to her satisfaction. Will proceed with the bariatric pathway towards sleeve gastrectomy.  Debra Claudetta Cuba, MD   4. Vitamin D  deficiency  Latest Reference Range & Units 03/06/22 13:08 08/29/22 07:49 01/31/23 11:55  Vitamin D , 25-Hydroxy 30.0 - 100.0 ng/mL 36.9 28.5 (L) 22.6 (L)  (L): Data is abnormally low  Vit D Level remain well below goal of 50-70 She is on Ergocalciferol  Q3D- denies N/V/Muscle Weakness  Assessment/Plan:   1. Insulin  resistance Remain off metformin  XR 750mg - MAR updated  2. Globus sensation F/u with GI as directed  3. Bariatric surgery status F/u with CSS   4. Vitamin D  deficiency Refill Vitamin D , Ergocalciferol , (DRISDOL ) 1.25 MG (50000 UNIT) CAPS capsule Take 1 capsule (50,000 Units total) by mouth every 3 (three) days. Dispense: 8 capsule, Refills: 0 ordered   6. Obesity, current BMI 49.7  Debra Horn is currently in the action stage of change. As such, her goal is to continue with weight loss efforts. She has agreed to the Category 2 Plan.   Exercise goals: All adults should avoid inactivity. Some physical activity is better than none, and adults who participate in any amount of physical activity gain some health benefits. Adults should also include muscle-strengthening activities that involve all major muscle groups on 2 or more days a week.  Behavioral modification strategies: increasing lean protein intake, decreasing simple carbohydrates, increasing vegetables, increasing water intake, no skipping meals, meal planning and cooking strategies, keeping healthy foods in the home, and planning for success.  Debra Horn has agreed to follow-up with our clinic in 4 weeks. She was informed of the importance of frequent follow-up visits to maximize her success with intensive lifestyle modifications for her multiple health conditions.    Complete labs per CSS tomorrow   Objective:   Blood pressure 116/75, pulse (!) 105, temperature 99.2 F (37.3 C), height 5\' 2"  (1.575 m), weight 271 lb (122.9 kg), SpO2 93%. Body mass index is 49.57 kg/m.  General: Cooperative, alert, well developed, in no acute distress. HEENT: Conjunctivae and lids unremarkable. Cardiovascular: Regular rhythm.  Lungs: Normal work of breathing. Neurologic: No focal deficits.   Lab Results  Component Value Date   CREATININE 0.72 01/31/2023   BUN 11 01/31/2023   NA 137 01/31/2023   K 4.6 01/31/2023   CL 99 01/31/2023   CO2 21 01/31/2023   Lab Results  Component Value Date   ALT 22 01/31/2023   AST 14 01/31/2023   ALKPHOS 94 01/31/2023   BILITOT 0.3 01/31/2023   Lab Results  Component Value Date   HGBA1C 5.7 (H) 01/31/2023   HGBA1C 5.2 08/29/2022   HGBA1C 5.5 03/06/2022   HGBA1C 5.6 09/21/2021   HGBA1C 5.6 02/04/2021   Lab Results  Component Value Date   INSULIN  28.3 (H) 01/31/2023   INSULIN  9.3 08/29/2022   INSULIN  20.2 03/06/2022   INSULIN  37.1 (H) 09/21/2021   INSULIN  17.6 02/04/2021   Lab Results  Component Value Date   TSH 0.731 01/31/2023  Lab Results  Component Value Date   CHOL 155 09/16/2020   HDL 46 09/16/2020   LDLCALC 97 09/16/2020   TRIG 62 09/16/2020   Lab Results  Component Value Date   VD25OH 22.6 (L) 01/31/2023   VD25OH 28.5 (L) 08/29/2022   VD25OH 36.9 03/06/2022   Lab Results  Component Value Date   WBC 8.7 05/11/2023   HGB 12.0 05/11/2023   HCT 36.9 05/11/2023   MCV 89.3 05/11/2023   PLT 219 05/11/2023   Lab Results  Component Value Date   IRON  31 05/11/2023   TIBC 409 05/11/2023   FERRITIN 16 05/11/2023   Attestation Statements:   Reviewed by clinician on day of visit: allergies, medications, problem list, medical history, surgical history, family history, social history, and previous encounter notes.  I have reviewed the above documentation for accuracy and completeness, and I  agree with the above. -  Eduardo Honor d. Earvin Blazier, NP-C

## 2023-06-08 ENCOUNTER — Ambulatory Visit (HOSPITAL_COMMUNITY)

## 2023-06-09 ENCOUNTER — Ambulatory Visit
Admission: RE | Admit: 2023-06-09 | Discharge: 2023-06-09 | Disposition: A | Discharge status: Home / Self Care | Source: Ambulatory Visit | Attending: Family Medicine | Admitting: Family Medicine

## 2023-06-09 VITALS — BP 112/79 | HR 79 | Temp 98.4°F | Resp 20

## 2023-06-09 DIAGNOSIS — L03317 Cellulitis of buttock: Secondary | ICD-10-CM

## 2023-06-09 MED ORDER — DOXYCYCLINE HYCLATE 100 MG PO CAPS: 100.0000 mg | ORAL_CAPSULE | Freq: Two times a day (BID) | ORAL | 0 refills | Status: DC

## 2023-06-09 MED ORDER — IBUPROFEN 600 MG PO TABS: 600.0000 mg | ORAL_TABLET | Freq: Four times a day (QID) | ORAL | 0 refills | Status: DC | PRN

## 2023-06-09 NOTE — ED Triage Notes (Signed)
 Pt c/o ?insect bite to left upper buttock area 8 days ago after using a fast food restaurant restroom-states husband expressed area "blood and pus"-NAD-steady gait

## 2023-06-09 NOTE — ED Provider Notes (Signed)
 [facility]  Note:  This document was prepared using Conservation officer, historic buildings and may include unintentional dictation errors.  MRN: [id number] DOB: 06-17-1985  Subjective:   Debra Horn is a 38 y.o. female presenting for 8-day history of persistent and worsening tenderness, redness over the left upper buttock area.  Patient reports that symptoms started from a possible insect bite.  She was at a [employer] when she felt something sting her but did not see anything in particular.  In the days that followed she developed progressive redness, pain.  Her and her husband saw that it appeared to come to ahead and then as they tried to drain it only had a small amount of pus come out.  No fever, nausea, vomiting.  She has kept it covered.  No current facility-administered medications for this encounter.  Current Outpatient Medications:    albuterol  (VENTOLIN  HFA) 108 (90 Base) MCG/ACT inhaler, Inhale 2 puffs into the lungs every 6 (six) hours as needed for wheezing or shortness of breath., Disp: 18 g, Rfl: 1   albuterol  (VENTOLIN  HFA) 108 (90 Base) MCG/ACT inhaler, Inhale 2 puffs into the lungs every 6 (six) hours as needed for wheezing or shortness of breath., Disp: 8 g, Rfl: 0   Budeson-Glycopyrrol-Formoterol  (BREZTRI  AEROSPHERE) 160-9-4.8 MCG/ACT AERO, Inhale 2 puffs into the lungs in the morning and at bedtime., Disp: 10.7 g, Rfl: 5   EPINEPHrine  0.3 mg/0.3 mL IJ SOAJ injection, Inject 0.3 mg into the muscle as needed for anaphylaxis., Disp: 1 each, Rfl: 1   famotidine  (PEPCID ) 40 MG tablet, Take 1 tablet (40 mg total) by mouth daily., Disp: 90 tablet, Rfl: 1   FLUoxetine  (PROZAC ) 20 MG capsule, Take 1 capsule (20 mg total) by mouth daily., Disp: 90 capsule, Rfl: 0   fluticasone  (FLOVENT  HFA) 110 MCG/ACT inhaler, Inhale 2 puffs into the lungs in the morning and at bedtime., Disp: 12 g, Rfl: 5   folic acid  (FOLVITE ) 1 MG tablet, Take 1 tablet (1 mg total) by  mouth daily., Disp: 30 tablet, Rfl: 2   levocetirizine (XYZAL ) 5 MG tablet, Take 1 tablet (5 mg total) by mouth every evening., Disp: 30 tablet, Rfl: 5   metroNIDAZOLE  (METROGEL ) 0.75 % vaginal gel, Place vaginally 2 (two) times daily., Disp: , Rfl:    Olopatadine-Mometasone (RYALTRIS ) 665-25 MCG/ACT SUSP, Place 2 sprays into the nose 2 (two) times daily as needed., Disp: 29 g, Rfl: 5   pantoprazole  (PROTONIX ) 40 MG tablet, Take 1 tablet (40 mg total) by mouth daily., Disp: 30 tablet, Rfl: 5   predniSONE  (DELTASONE ) 10 MG tablet, Take one tablet (10mg ) twice daily for six days, then one tablet (10mg ) once daily for six days, then STOP., Disp: 18 tablet, Rfl: 0   Vitamin D , Ergocalciferol , (DRISDOL ) 1.25 MG (50000 UNIT) CAPS capsule, Take 1 capsule (50,000 Units total) by mouth every 3 (three) days., Disp: 8 capsule, Rfl: 0   Allergies  Allergen Reactions   Banana Anaphylaxis   Cefixime Rash    Past Medical History:  Diagnosis Date   Anemia    low iron    Asthma    Complication of anesthesia    bp dropped in middle of last c-section and started with n/v   GERD (gastroesophageal reflux disease)    during pregnancy   Headache    "couple days/week" (10/28/2015)   Hyperthyroidism    not on medication   PE (pulmonary embolism) 11/2011   PONV (postoperative nausea and vomiting)  Pregnancy induced hypertension    Preterm labor    Sleep apnea    SOB (shortness of breath)    Thyroid  condition    Vitamin D  deficiency      Past Surgical History:  Procedure Laterality Date   CESAREAN SECTION  2008; 2014   CESAREAN SECTION N/A 07/27/2017   Procedure: REPEAT CESAREAN SECTION;  Surgeon: Granville Layer, MD;  Location: [facility];  Service: Obstetrics;  Laterality: N/A;   FRACTURE SURGERY     ORIF ANKLE FRACTURE Right 10/28/2015   ORIF ANKLE FRACTURE Right 10/28/2015   Procedure: OPEN REDUCTION INTERNAL FIXATION (ORIF) RIGHT ANKLE FRACTURE;  Surgeon: Janeth Medicus, MD;   Location: [facility] OR;  Service: Orthopedics;  Laterality: Right;   TUBAL LIGATION Bilateral 07/27/2017   Procedure: BILATERAL TUBAL LIGATION;  Surgeon: Granville Layer, MD;  Location: [facility] BIRTHING SUIT[facility];  Service: Obstetrics;  Laterality: Bilateral;    Family History  Problem Relation Age of Onset   Rheum arthritis Mother    Lupus Mother    Arthritis Mother    Asthma Father    Hypertension Father    Diabetes Father    Cancer Father    Cancer Sister        skin   Hypothyroidism Maternal Aunt    Cancer Maternal Grandmother        leukemia   Asthma Maternal Grandfather    Hypertension Maternal Grandfather    Stroke Maternal Grandfather    Hypertension Paternal Grandmother    Cancer Paternal Grandmother    Hyperlipidemia Paternal Grandmother    Diabetes Paternal Grandmother    Lupus Other     Social History   Tobacco Use   Smoking status: Never   Smokeless tobacco: Never  Vaping Use   Vaping status: Never Used  Substance Use Topics   Alcohol use: No   Drug use: No    ROS   Objective:   Vitals: BP 112/79 (BP Location: Left Arm)   Pulse 79   Temp 98.4 F (36.9 C) (Oral)   Resp 20   LMP 05/17/2023   SpO2 98%   Physical Exam Constitutional:      General: She is not in acute distress.    Appearance: Normal appearance. She is well-developed. She is not ill-appearing, toxic-appearing or diaphoretic.  HENT:     Head: Normocephalic and atraumatic.     Nose: Nose normal.     Mouth/Throat:     Mouth: Mucous membranes are moist.  Eyes:     General: No scleral icterus.       Right eye: No discharge.        Left eye: No discharge.     Extraocular Movements: Extraocular movements intact.  Cardiovascular:     Rate and Rhythm: Normal rate.  Pulmonary:     Effort: Pulmonary effort is normal.  Skin:    General: Skin is warm and dry.       Neurological:     General: No focal deficit present.     Mental Status: She is alert and oriented to person, place, and time.   Psychiatric:        Mood and Affect: Mood normal.        Behavior: Behavior normal.     Assessment and Plan :   PDMP not reviewed this encounter.  1. Cellulitis of left buttock    Area not amenable to incision and drainage.  Recommended managing for cellulitis with oral doxycycline , ibuprofen  for pain and  inflammation, warm compresses.  Counseled patient on potential for adverse effects with medications prescribed/recommended today, ER and return-to-clinic precautions discussed, patient verbalized understanding.    Adolph Hoop, [place] 06/09/23 1610

## 2023-06-11 NOTE — Progress Notes (Deleted)
 06/11/2023 [name] [id number] November 12, 1985  Referring provider: Abram Abraham, NP-C Primary GI doctor: {[id number]}  ASSESSMENT AND PLAN:  GERD - increase pantoprazole  20 mg BID for 3 months -alginate therapy given -Lifestyle changes discussed, avoid NSAIDS, ETOH, hand out given to the patient -Weight loss discussed with the patient -Smoking cessation discussed in detail -Schedule EGD at [facility] to evaluate GERd, esophagitis, hiatal hernia,I discussed risks of EGD with patient today, including risk of sedation, bleeding or perforation. Patient provides understanding and gave verbal consent to proceed. - consider RUQ US  and HIDA pending results -Consider GES, gastroparesis diet given   Patient Care Team: Abram Abraham, NP-C as PCP - General (Family Medicine) Aleck Hurdle, MD as Consulting Physician (Pulmonary Disease)  HISTORY OF PRESENT ILLNESS: 38 y.o. female with a past medical history listed below presents for evaluation of GERD.   Had a no-show in November and March with our office  *** Discussed the use of AI scribe software for clinical note transcription with the patient, who gave verbal consent to proceed.  History of Present Illness            She  reports that she has never smoked. She has never used smokeless tobacco. She reports that she does not drink alcohol and does not use drugs.  RELEVANT GI HISTORY, IMAGING AND LABS: Results          CBC    Component Value Date/Time   WBC 8.7 05/11/2023 0819   WBC 9.6 12/07/2022 0323   RBC 4.13 05/11/2023 0819   HGB 12.0 05/11/2023 0819   HGB 11.3 02/09/2017 1233   HCT 36.9 05/11/2023 0819   HCT 34.5 02/09/2017 1233   PLT 219 05/11/2023 0819   PLT 378 02/09/2017 1233   MCV 89.3 05/11/2023 0819   MCV 85 02/09/2017 1233   MCH 29.1 05/11/2023 0819   MCHC 32.5 05/11/2023 0819   RDW 14.4 05/11/2023 0819   RDW 16.3 (H) 02/09/2017 1233   LYMPHSABS 4.1 (H) 05/11/2023 0819   LYMPHSABS 2.8  02/09/2017 1233   MONOABS 0.7 05/11/2023 0819   EOSABS 0.1 05/11/2023 0819   EOSABS 0.5 (H) 02/09/2017 1233   BASOSABS 0.0 05/11/2023 0819   BASOSABS 0.0 02/09/2017 1233   Recent Labs    11/24/22 0419 12/07/22 0323 05/11/23 0819  HGB 12.6 12.8 12.0    CMP     Component Value Date/Time   NA 137 01/31/2023 1155   K 4.6 01/31/2023 1155   CL 99 01/31/2023 1155   CO2 21 01/31/2023 1155   GLUCOSE 109 (H) 01/31/2023 1155   GLUCOSE 111 (H) 12/07/2022 0323   BUN 11 01/31/2023 1155   CREATININE 0.72 01/31/2023 1155   CREATININE 0.76 03/07/2021 1130   CALCIUM 9.2 01/31/2023 1155   PROT 6.9 01/31/2023 1155   ALBUMIN 3.9 01/31/2023 1155   AST 14 01/31/2023 1155   AST 11 (L) 03/07/2021 1130   ALT 22 01/31/2023 1155   ALT 12 03/07/2021 1130   ALKPHOS 94 01/31/2023 1155   BILITOT 0.3 01/31/2023 1155   BILITOT 0.3 03/07/2021 1130   GFRNONAA >60 12/07/2022 0323   GFRNONAA >60 03/07/2021 1130   GFRAA >60 07/31/2017 1441      Latest Ref Rng & Units 01/31/2023   11:55 AM 12/07/2022    3:23 AM 11/24/2022    4:19 AM  Hepatic Function  Total Protein 6.0 - 8.5 g/dL 6.9  7.4  7.3   Albumin 3.9 - 4.9  g/dL 3.9  3.6  3.4   AST 0 - 40 IU/L 14  22  25    ALT 0 - 32 IU/L 22  30  23    Alk Phosphatase 44 - 121 IU/L 94  98  83   Total Bilirubin 0.0 - 1.2 mg/dL 0.3  0.5  0.9       Current Medications:   Current Outpatient Medications (Endocrine & Metabolic):    predniSONE  (DELTASONE ) 10 MG tablet, Take one tablet (10mg ) twice daily for six days, then one tablet (10mg ) once daily for six days, then STOP.  Current Outpatient Medications (Cardiovascular):    EPINEPHrine  0.3 mg/0.3 mL IJ SOAJ injection, Inject 0.3 mg into the muscle as needed for anaphylaxis.  Current Outpatient Medications (Respiratory):    albuterol  (VENTOLIN  HFA) 108 (90 Base) MCG/ACT inhaler, Inhale 2 puffs into the lungs every 6 (six) hours as needed for wheezing or shortness of breath.   albuterol  (VENTOLIN  HFA) 108 (90  Base) MCG/ACT inhaler, Inhale 2 puffs into the lungs every 6 (six) hours as needed for wheezing or shortness of breath.   Budeson-Glycopyrrol-Formoterol  (BREZTRI  AEROSPHERE) 160-9-4.8 MCG/ACT AERO, Inhale 2 puffs into the lungs in the morning and at bedtime.   fluticasone  (FLOVENT  HFA) 110 MCG/ACT inhaler, Inhale 2 puffs into the lungs in the morning and at bedtime.   levocetirizine (XYZAL ) 5 MG tablet, Take 1 tablet (5 mg total) by mouth every evening.   Olopatadine-Mometasone (RYALTRIS ) 665-25 MCG/ACT SUSP, Place 2 sprays into the nose 2 (two) times daily as needed.  Current Outpatient Medications (Analgesics):    ibuprofen  (ADVIL ) 600 MG tablet, Take 1 tablet (600 mg total) by mouth every 6 (six) hours as needed.  Current Outpatient Medications (Hematological):    folic acid  (FOLVITE ) 1 MG tablet, Take 1 tablet (1 mg total) by mouth daily.  Current Outpatient Medications (Other):    doxycycline  (VIBRAMYCIN ) 100 MG capsule, Take 1 capsule (100 mg total) by mouth 2 (two) times daily.   famotidine  (PEPCID ) 40 MG tablet, Take 1 tablet (40 mg total) by mouth daily.   FLUoxetine  (PROZAC ) 20 MG capsule, Take 1 capsule (20 mg total) by mouth daily.   metroNIDAZOLE  (METROGEL ) 0.75 % vaginal gel, Place vaginally 2 (two) times daily.   pantoprazole  (PROTONIX ) 40 MG tablet, Take 1 tablet (40 mg total) by mouth daily.   Vitamin D , Ergocalciferol , (DRISDOL ) 1.25 MG (50000 UNIT) CAPS capsule, Take 1 capsule (50,000 Units total) by mouth every 3 (three) days.  Medical History:  Past Medical History:  Diagnosis Date   Anemia    low iron    Asthma    Complication of anesthesia    bp dropped in middle of last c-section and started with n/v   GERD (gastroesophageal reflux disease)    during pregnancy   Headache    "couple days/week" (10/28/2015)   Hyperthyroidism    not on medication   PE (pulmonary embolism) 11/2011   PONV (postoperative nausea and vomiting)    Pregnancy induced hypertension     Preterm labor    Sleep apnea    SOB (shortness of breath)    Thyroid  condition    Vitamin D  deficiency    Allergies:  Allergies  Allergen Reactions   Banana Anaphylaxis   Cefixime Rash     Surgical History:  She  has a past surgical history that includes Cesarean section (2008; 2014); ORIF ankle fracture (Right, 10/28/2015); Fracture surgery; ORIF ankle fracture (Right, 10/28/2015); Cesarean section (N/A, 07/27/2017); and Tubal ligation (Bilateral,  07/27/2017). Family History:  Her family history includes Arthritis in her mother; Asthma in her father and maternal grandfather; Cancer in her father, maternal grandmother, paternal grandmother, and sister; Diabetes in her father and paternal grandmother; Hyperlipidemia in her paternal grandmother; Hypertension in her father, maternal grandfather, and paternal grandmother; Hypothyroidism in her maternal aunt; Lupus in her mother and another family member; Rheum arthritis in her mother; Stroke in her maternal grandfather.  REVIEW OF SYSTEMS  : All other systems reviewed and negative except where noted in the History of Present Illness.  PHYSICAL EXAM: LMP 05/17/2023  Physical Exam          Edmonia Gottron, PA-C 1:16 PM

## 2023-06-12 ENCOUNTER — Ambulatory Visit: Admitting: Physician Assistant

## 2023-06-13 ENCOUNTER — Encounter: Attending: Surgery | Admitting: Dietician

## 2023-06-13 ENCOUNTER — Encounter: Payer: Self-pay | Admitting: Dietician

## 2023-06-13 VITALS — Wt 276.6 lb

## 2023-06-13 DIAGNOSIS — E669 Obesity, unspecified: Secondary | ICD-10-CM

## 2023-06-13 NOTE — Progress Notes (Signed)
 Nutrition Counseling for Bariatric Surgery   Appointment start time: 1500   end time: 1540  Planned Surgery: sleeve gastrectomy  Anthropometrics: Weight: 276.6lbs Height: 5'2 BMI: 50.59     Clinical: Medical History changes: no changes Medications and Supplements: albuterol  inhaler, epinephrine  pen injector, famotidine , pantoprazole , fluticasone , pepcid  prn Notable symptoms: none noted Drug allergies: cefixime Food allergies: banana -- anaphylaxis  Lifestyle and Dietary Progress:  Patient has participated in [facility] program since 2022. Visit 06/07/2023 indicated increase in muscle mass and 2lbb decrease in body fat Has recently been keeping dietary journal, working to eat regularly. She has met goals set at previous MNT visit of eating at regular intervals and including a protein source with meals.  Continues with regular exercise Reports extensive research into bariatric surgery journey, voices understanding of need for lifelong adherence to healthy eating pattern and physical activity.  Patient voices confidence that she can follow bariatric diet guidelines with knowledge that the journey is not an easy one.   Physical activity: walk  30 minutes, 3-4x a week  Dietary Recall:  Daily pattern: 3 meals and 0-1 snacks. Dining out: 0-1 meals per week (Salad, chicken breast) Breakfast: 3 boiled eggs +/or oatmeal with protein powder added Snack: none Lunch: protein ie tuna/ 4oz chicken + salad Snack: watermelon/ strawberries/ pineapple Supper: tilapia 3pcs + 1c green beans or peas + 1c instant mashed potatoes (pre-measured cup) Snack: none Beverages: water, flavored water, green tea, reducing portion of honey   Nutrition Intervention: reviewed pre-op diet goals and importance of close adherence to bariatric diet after surgery to avoid side effects and complications.  Reviewed stages of the bariatric diet after surgery as well as the importance of adequate protein  and fluid intake.  Discussed benefits of positive support from one or more people ie relatives, friends, coworkers, Catering manager. Informed patient of support group options.    Nutrition Diagnosis: [place]-3.3 Overweight/ obesity related to history of excess calories and inadequate physical activity as evidenced by patient with current BMI of 50.59, following dietary guidelines for weight loss prior to bariatric surgery.  Teaching method(s) utilized: Risk manager provided: Am I Ready -- list of items for consideration prior to surgery (AND)  Learning Readiness: Change in progress  Barriers to learning/ implementing lifestyle change: none  Demonstrated degree of understanding via: Teach Back  Summary: Patient is making diet and lifestyle changes in effort to lose weight and prepare for bariatric surgery.  Patient's goal for weight loss is improvement in health status and risk.  She has seen positive results from increased protein intake and is motivated to continue consuming balanced meals at regular intervals prior to surgery.  She is motivated to follow the bariatric diet after surgery.  From a nutrition standpoint, this patient is ready to proceed with the bariatric surgery program. She is a good candidate for bariatric surgery.   Plan: Patient commits to returning for  pre-op class prior to surgery.  She will plan to return for post-op RD visits beginning 2 weeks after surgery.

## 2023-06-19 DIAGNOSIS — G4733 Obstructive sleep apnea (adult) (pediatric): Secondary | ICD-10-CM | POA: Diagnosis not present

## 2023-07-08 ENCOUNTER — Telehealth: Admitting: Emergency Medicine

## 2023-07-08 DIAGNOSIS — J45901 Unspecified asthma with (acute) exacerbation: Secondary | ICD-10-CM | POA: Diagnosis not present

## 2023-07-08 MED ORDER — PREDNISONE 20 MG PO TABS: 20.0000 mg | ORAL_TABLET | Freq: Every day | ORAL | 0 refills | Status: DC

## 2023-07-08 MED ORDER — ALBUTEROL SULFATE HFA 108 (90 BASE) MCG/ACT IN AERS: 2.0000 | INHALATION_SPRAY | Freq: Four times a day (QID) | RESPIRATORY_TRACT | 0 refills | Status: DC | PRN

## 2023-07-08 NOTE — Progress Notes (Signed)
 Virtual Visit Consent   Debra Horn, you are scheduled for a virtual visit with a [facility] provider today. Just as with appointments in the office, your consent must be obtained to participate. Your consent will be active for this visit and any virtual visit you may have with one of our providers in the next 365 days. If you have a MyChart account, a copy of this consent can be sent to you electronically.  As this is a virtual visit, video technology does not allow for your provider to perform a traditional examination. This may limit your provider's ability to fully assess your condition. If your provider identifies any concerns that need to be evaluated in person or the need to arrange testing (such as labs, EKG, etc.), we will make arrangements to do so. Although advances in technology are sophisticated, we cannot ensure that it will always work on either your end or our end. If the connection with a video visit is poor, the visit may have to be switched to a telephone visit. With either a video or telephone visit, we are not always able to ensure that we have a secure connection.  By engaging in this virtual visit, you consent to the provision of healthcare and authorize for your insurance to be billed (if applicable) for the services provided during this visit. Depending on your insurance coverage, you may receive a charge related to this service.  I need to obtain your verbal consent now. Are you willing to proceed with your visit today? Debra Horn has provided verbal consent on 07/08/2023 for a virtual visit (video or telephone). Jon CHRISTELLA Belt, NP  Date: 07/08/2023 9:21 AM   Virtual Visit via Video Note   I, Jon CHRISTELLA Belt, connected with  Debra Horn  ([id number], [birth year]) on 07/08/23 at  8:45 AM EDT by a video-enabled telemedicine application and verified that I am speaking with the correct person using two identifiers.  Location: Patient: Virtual Visit Location Patient:  Home Provider: Virtual Visit Location Provider: Home Office   I discussed the limitations of evaluation and management by telemedicine and the availability of in person appointments. The patient expressed understanding and agreed to proceed.    History of Present Illness: Debra Horn is a 38 y.o. who identifies as a female who was assigned female at birth, and is being seen today for Asthma flare. Mild SOB, can't take deep breaths but doesn't feel like she can't breathe. Hx asthma. Has albuterol  inhaler - not helping - using twice a day. Flares happen with weather changes, when it rains. REquests prednisone  and refill albuterol .  She is using Xyzal , Flovent , and albuterol  daily as prescribed.  She has appointments with her allergist and her pulmonologist coming up this month.  HPI: HPI  Problems:  Patient Active Problem List   Diagnosis Date Noted   Anemia 05/11/2023   Iron  deficiency anemia 05/25/2022   Shortness of breath 08/22/2021   Moderate persistent asthma with exacerbation 07/28/2021   Diffuse pain 05/31/2021   Allergic reaction 05/11/2021   Iron  deficiency anemia due to chronic blood loss 03/07/2021   Low folate 03/07/2021   Class 3 severe obesity with serious comorbidity and body mass index (BMI) of 40.0 to 44.9 in adult 03/03/2021   CRP elevated 12/01/2020   Elevated sed rate 12/01/2020   Nasal drainage 11/09/2020   Insulin  resistance 10/26/2020   Other chest pain 10/13/2020   Severe sleep apnea 06/24/2020   BMI 45.0-49.9, adult (HCC)  06/24/2020   Status post repeat low transverse cesarean section 07/27/2017   Poor fetal growth affecting management of mother in third trimester    Previous pregnancy complicated by pregnancy-induced hypertension in third trimester, antepartum    IUGR (intrauterine growth restriction) in prior pregnancy, pregnant 03/06/2017   Vitamin D  deficiency 02/17/2017   History of preterm delivery 02/09/2017   History of asthma    Normocytic  anemia 02/06/2016   Family history of systemic lupus erythematosus (SLE) in mother 02/19/2014   Hyperthyroidism 02/19/2014   Previous cesarean section 08/08/2012   History of pulmonary embolus (PE) 07/24/2012    Allergies:  Allergies  Allergen Reactions   Banana Anaphylaxis   Cefixime Rash   Medications:  Current Outpatient Medications:    predniSONE  (DELTASONE ) 20 MG tablet, Take 1 tablet (20 mg total) by mouth daily with breakfast., Disp: 10 tablet, Rfl: 0   albuterol  (VENTOLIN  HFA) 108 (90 Base) MCG/ACT inhaler, Inhale 2 puffs into the lungs every 6 (six) hours as needed for wheezing or shortness of breath., Disp: 18 g, Rfl: 1   albuterol  (VENTOLIN  HFA) 108 (90 Base) MCG/ACT inhaler, Inhale 2 puffs into the lungs every 6 (six) hours as needed for wheezing or shortness of breath., Disp: 8 g, Rfl: 0   Budeson-Glycopyrrol-Formoterol  (BREZTRI  AEROSPHERE) 160-9-4.8 MCG/ACT AERO, Inhale 2 puffs into the lungs in the morning and at bedtime., Disp: 10.7 g, Rfl: 5   doxycycline  (VIBRAMYCIN ) 100 MG capsule, Take 1 capsule (100 mg total) by mouth 2 (two) times daily., Disp: 20 capsule, Rfl: 0   EPINEPHrine  0.3 mg/0.3 mL IJ SOAJ injection, Inject 0.3 mg into the muscle as needed for anaphylaxis., Disp: 1 each, Rfl: 1   famotidine  (PEPCID ) 40 MG tablet, Take 1 tablet (40 mg total) by mouth daily., Disp: 90 tablet, Rfl: 1   FLUoxetine  (PROZAC ) 20 MG capsule, Take 1 capsule (20 mg total) by mouth daily., Disp: 90 capsule, Rfl: 0   fluticasone  (FLOVENT  HFA) 110 MCG/ACT inhaler, Inhale 2 puffs into the lungs in the morning and at bedtime., Disp: 12 g, Rfl: 5   folic acid  (FOLVITE ) 1 MG tablet, Take 1 tablet (1 mg total) by mouth daily., Disp: 30 tablet, Rfl: 2   ibuprofen  (ADVIL ) 600 MG tablet, Take 1 tablet (600 mg total) by mouth every 6 (six) hours as needed., Disp: 30 tablet, Rfl: 0   levocetirizine (XYZAL ) 5 MG tablet, Take 1 tablet (5 mg total) by mouth every evening., Disp: 30 tablet, Rfl: 5    metroNIDAZOLE  (METROGEL ) 0.75 % vaginal gel, Place vaginally 2 (two) times daily., Disp: , Rfl:    Olopatadine-Mometasone (RYALTRIS ) 665-25 MCG/ACT SUSP, Place 2 sprays into the nose 2 (two) times daily as needed., Disp: 29 g, Rfl: 5   pantoprazole  (PROTONIX ) 40 MG tablet, Take 1 tablet (40 mg total) by mouth daily., Disp: 30 tablet, Rfl: 5   Vitamin D , Ergocalciferol , (DRISDOL ) 1.25 MG (50000 UNIT) CAPS capsule, Take 1 capsule (50,000 Units total) by mouth every 3 (three) days., Disp: 8 capsule, Rfl: 0  Observations/Objective: Patient is well-developed, well-nourished in no acute distress.  Resting comfortably  at home.  Head is normocephalic, atraumatic.  No labored breathing.  She is speaking in complete sentences with ease. Speech is clear and coherent with logical content.  Patient is alert and oriented at baseline.    Assessment and Plan: 1. Mild asthma with exacerbation, unspecified whether persistent (Primary)  I have prescribed prednisone  as requested.  She is getting low in albuterol  and  I refilled that as well.  She will follow-up with allergist and pulmonologist as scheduled.  We discussed reasons for seeking ER care  Follow Up Instructions: I discussed the assessment and treatment plan with the patient. The patient was provided an opportunity to ask questions and all were answered. The patient agreed with the plan and demonstrated an understanding of the instructions.  A copy of instructions were sent to the patient via MyChart unless otherwise noted below.   The patient was advised to call back or seek an in-person evaluation if the symptoms worsen or if the condition fails to improve as anticipated.    Jon CHRISTELLA Belt, NP

## 2023-07-08 NOTE — Patient Instructions (Signed)
 Debra Horn, thank you for joining Debra CHRISTELLA Belt, NP for today's virtual visit.  While this provider is not your primary care provider (PCP), if your PCP is located in our provider database this encounter information will be shared with them immediately following your visit.   A [facility] MyChart account gives you access to today's visit and all your visits, tests, and labs performed at [facility]  click here if you don't have a [facility] MyChart account or go to [email or web address]  Consent: (Patient) Debra Horn provided verbal consent for this virtual visit at the beginning of the encounter.  Current Medications:  Current Outpatient Medications:    predniSONE  (DELTASONE ) 20 MG tablet, Take 1 tablet (20 mg total) by mouth daily with breakfast., Disp: 10 tablet, Rfl: 0   albuterol  (VENTOLIN  HFA) 108 (90 Base) MCG/ACT inhaler, Inhale 2 puffs into the lungs every 6 (six) hours as needed for wheezing or shortness of breath., Disp: 18 g, Rfl: 1   albuterol  (VENTOLIN  HFA) 108 (90 Base) MCG/ACT inhaler, Inhale 2 puffs into the lungs every 6 (six) hours as needed for wheezing or shortness of breath., Disp: 8 g, Rfl: 0   Budeson-Glycopyrrol-Formoterol  (BREZTRI  AEROSPHERE) 160-9-4.8 MCG/ACT AERO, Inhale 2 puffs into the lungs in the morning and at bedtime., Disp: 10.7 g, Rfl: 5   doxycycline  (VIBRAMYCIN ) 100 MG capsule, Take 1 capsule (100 mg total) by mouth 2 (two) times daily., Disp: 20 capsule, Rfl: 0   EPINEPHrine  0.3 mg/0.3 mL IJ SOAJ injection, Inject 0.3 mg into the muscle as needed for anaphylaxis., Disp: 1 each, Rfl: 1   famotidine  (PEPCID ) 40 MG tablet, Take 1 tablet (40 mg total) by mouth daily., Disp: 90 tablet, Rfl: 1   FLUoxetine  (PROZAC ) 20 MG capsule, Take 1 capsule (20 mg total) by mouth daily., Disp: 90 capsule, Rfl: 0   fluticasone  (FLOVENT  HFA) 110 MCG/ACT inhaler, Inhale 2 puffs into the lungs in the morning and at bedtime., Disp: 12 g, Rfl: 5    folic acid  (FOLVITE ) 1 MG tablet, Take 1 tablet (1 mg total) by mouth daily., Disp: 30 tablet, Rfl: 2   ibuprofen  (ADVIL ) 600 MG tablet, Take 1 tablet (600 mg total) by mouth every 6 (six) hours as needed., Disp: 30 tablet, Rfl: 0   levocetirizine (XYZAL ) 5 MG tablet, Take 1 tablet (5 mg total) by mouth every evening., Disp: 30 tablet, Rfl: 5   metroNIDAZOLE  (METROGEL ) 0.75 % vaginal gel, Place vaginally 2 (two) times daily., Disp: , Rfl:    Olopatadine-Mometasone (RYALTRIS ) 665-25 MCG/ACT SUSP, Place 2 sprays into the nose 2 (two) times daily as needed., Disp: 29 g, Rfl: 5   pantoprazole  (PROTONIX ) 40 MG tablet, Take 1 tablet (40 mg total) by mouth daily., Disp: 30 tablet, Rfl: 5   Vitamin D , Ergocalciferol , (DRISDOL ) 1.25 MG (50000 UNIT) CAPS capsule, Take 1 capsule (50,000 Units total) by mouth every 3 (three) days., Disp: 8 capsule, Rfl: 0   Medications ordered in this encounter:  Meds ordered this encounter  Medications   predniSONE  (DELTASONE ) 20 MG tablet    Sig: Take 1 tablet (20 mg total) by mouth daily with breakfast.    Dispense:  10 tablet    Refill:  0   albuterol  (VENTOLIN  HFA) 108 (90 Base) MCG/ACT inhaler    Sig: Inhale 2 puffs into the lungs every 6 (six) hours as needed for wheezing or shortness of breath.    Dispense:  8 g    Refill:  0     *  If you need refills on other medications prior to your next appointment, please contact your pharmacy*  Follow-Up: Call back or seek an in-person evaluation if the symptoms worsen or if the condition fails to improve as anticipated.  [facility] [phone]  Other Instructions Continue using albuterol  to help relieve your symptoms.  If you are very short of breath and your inhaler is not helping, please be seen in person.  If your symptoms are severe, please be seen in the emergency room.  Follow-up with your allergist and pulmonologist as scheduled.   If you have been instructed to have an in-person evaluation  today at a local Urgent Care facility, please use the link below. It will take you to a list of all of our available [facility], including address, phone number and hours of operation. Please do not delay care.  [facility]  If you or a family member do not have a primary care provider, use the link below to schedule a visit and establish care. When you choose a [facility] primary care physician or advanced practice provider, you gain a long-term partner in health. Find a Primary Care Provider  Learn more about [facility]'s in-office and virtual care options: [facility] - Get Care Now

## 2023-07-09 ENCOUNTER — Encounter: Payer: Self-pay | Admitting: Skilled Nursing Facility1

## 2023-07-09 ENCOUNTER — Encounter: Attending: Surgery | Admitting: Skilled Nursing Facility1

## 2023-07-09 DIAGNOSIS — E66813 Obesity, class 3: Secondary | ICD-10-CM | POA: Diagnosis not present

## 2023-07-09 DIAGNOSIS — Z6841 Body Mass Index (BMI) 40.0 and over, adult: Secondary | ICD-10-CM | POA: Insufficient documentation

## 2023-07-09 NOTE — Progress Notes (Signed)
 Pre-Operative Nutrition Class:    Patient was seen on 07/09/2023 for Pre-Operative Bariatric Surgery Education at [facility]    Surgery date:  Surgery type: Sleeve gastrectomy  Start weight at [facility]: 279.9 Weight today: 278.7  Samples given per MNT protocol. Patient educated on appropriate usage: Ensure Max Lot # [id number] Exp: 03/02/2024   Celebrate multi Lot # [id number] Exp: 01/26  The following the learning objectives were met by the patient during this course: Identify Pre-Op Dietary Goals and will begin 2 weeks pre-operatively Identify appropriate sources of fluids and proteins  State protein recommendations and appropriate sources pre and post-operatively Identify Post-Operative Dietary Goals and will follow for 2 weeks post-operatively Identify appropriate multivitamin and calcium sources Describe the need for physical activity post-operatively and will follow MD recommendations State when to call healthcare provider regarding medication questions or post-operative complications When having a diagnosis of diabetes understanding hypoglycemia symptoms and the inclusion of 1 complex carbohydrate per meal  Handouts given during class include: Pre-Op Bariatric Surgery Diet Handout Protein Shake Handout Post-Op Bariatric Surgery Nutrition Handout [facility] Program Information Flyer Support Group Information Flyer [facility] Bariatric Supplements Price List  Follow-Up Plan: Patient will follow-up at [facility] 2 weeks post operatively for diet advancement per MD.

## 2023-07-15 DIAGNOSIS — Z760 Encounter for issue of repeat prescription: Secondary | ICD-10-CM | POA: Diagnosis not present

## 2023-07-15 DIAGNOSIS — J45909 Unspecified asthma, uncomplicated: Secondary | ICD-10-CM | POA: Diagnosis not present

## 2023-07-15 DIAGNOSIS — Z7952 Long term (current) use of systemic steroids: Secondary | ICD-10-CM | POA: Diagnosis not present

## 2023-07-17 ENCOUNTER — Other Ambulatory Visit (INDEPENDENT_AMBULATORY_CARE_PROVIDER_SITE_OTHER): Payer: Self-pay | Admitting: Adult Health

## 2023-07-17 DIAGNOSIS — E559 Vitamin D deficiency, unspecified: Secondary | ICD-10-CM

## 2023-07-18 ENCOUNTER — Ambulatory Visit: Admitting: Primary Care

## 2023-07-18 ENCOUNTER — Encounter (INDEPENDENT_AMBULATORY_CARE_PROVIDER_SITE_OTHER): Payer: Self-pay

## 2023-07-18 ENCOUNTER — Ambulatory Visit (INDEPENDENT_AMBULATORY_CARE_PROVIDER_SITE_OTHER): Admitting: Adult Health

## 2023-07-19 ENCOUNTER — Encounter: Payer: Self-pay | Admitting: Primary Care

## 2023-07-21 ENCOUNTER — Other Ambulatory Visit (HOSPITAL_COMMUNITY): Payer: Self-pay

## 2023-07-23 ENCOUNTER — Other Ambulatory Visit: Payer: Self-pay

## 2023-07-24 DIAGNOSIS — G4733 Obstructive sleep apnea (adult) (pediatric): Secondary | ICD-10-CM | POA: Diagnosis not present

## 2023-07-30 ENCOUNTER — Other Ambulatory Visit (HOSPITAL_COMMUNITY): Payer: Self-pay

## 2023-07-30 NOTE — Progress Notes (Deleted)
 [facility] OFFICE PROGRESS NOTE  Lendia Boby CROME, NP-C [address] [place] [address]  DIAGNOSIS: Persistent iron  deficiency anemia secondary to menorrhagia. The patient also has thrombocytosis that is reactive in nature secondary to the iron  deficiency. She is unable to correct her iron  deficiency with the oral supplements   PRIOR THERAPY: None   CURRENT THERAPY:  1) IV iron  PRN, the most recent with feraheme  on 06/16/22. She has intolerance to venofer . 2) Folic acid  1 mg p.o. daily 3) Oral iron  supplements recently resumed this earlier this week   INTERVAL HISTORY: Debra Horn Para 38 y.o. female returns  returns to the clinic today for Horn follow-up visit. The patient was last seen in the clinic on 05/11/23.   She was seen for iron  deficiency anemia secondary to menorrhagia. She also was found to have folic acid  deficiency and Dr. Sherrod prescribed folic acid  1 mg p.o. daily. She has since *** this.   She has intolerance to venofer . Therefore, she takes feraheme  if needed.   When she was seen last year, she was being considered for bariatric surgery by  [facility] surgery by Dr. Cameron. She is back today so she can ensure her labs are optimal to be considered for surgery. She is followed by the [facility]. She make dietary changes and portion control. She has not lost weight but she feels so much better.  She is scheduled for surgery on ***.   Overall, the patient states that she feels good.  Her energy was good since cutting out soda and sugar.  Denies any dizziness.  She can walk farther.    She still has heavy menstrual cycles intermittently. She states sometimes they are light. She is not able to take birth control due to her history of pulmonary embolism from birth control. She denies any other abnormal bleeding or bruising.  She is here today for evaluation and repeat blood work.   MEDICAL HISTORY: Past Medical History:   Diagnosis Date   Anemia    low iron    Asthma    Complication of anesthesia    bp dropped in middle of last c-section and started with n/v   GERD (gastroesophageal reflux disease)    during pregnancy   Headache    couple days/week (10/28/2015)   Hyperthyroidism    not on medication   PE (pulmonary embolism) 11/2011   PONV (postoperative nausea and vomiting)    Pregnancy induced hypertension    Preterm labor    Sleep apnea    SOB (shortness of breath)    Thyroid  condition    Vitamin D  deficiency     ALLERGIES:  is allergic to banana and cefixime.  MEDICATIONS:  Current Outpatient Medications  Medication Sig Dispense Refill   albuterol  (VENTOLIN  HFA) 108 (90 Base) MCG/ACT inhaler Inhale 2 puffs into the lungs every 6 (six) hours as needed for wheezing or shortness of breath. 18 g 1   albuterol  (VENTOLIN  HFA) 108 (90 Base) MCG/ACT inhaler Inhale 2 puffs into the lungs every 6 (six) hours as needed for wheezing or shortness of breath. 8 g 0   budesonide -glycopyrrolate -formoterol  (BREZTRI  AEROSPHERE) 160-9-4.8 MCG/ACT AERO inhaler Inhale 2 puffs into the lungs in the morning and at bedtime. 10.7 g 5   doxycycline  (VIBRAMYCIN ) 100 MG capsule Take 1 capsule (100 mg total) by mouth 2 (two) times daily. 20 capsule 0   EPINEPHrine  0.3 mg/0.3 mL IJ SOAJ injection Inject 0.3 mg into  the muscle as needed for anaphylaxis. 1 each 1   famotidine  (PEPCID ) 40 MG tablet Take 1 tablet (40 mg total) by mouth daily. 90 tablet 1   FLUoxetine  (PROZAC ) 20 MG capsule Take 1 capsule (20 mg total) by mouth daily. 90 capsule 0   fluticasone  (FLOVENT  HFA) 110 MCG/ACT inhaler Inhale 2 puffs into the lungs in the morning and at bedtime. 12 g 5   folic acid  (FOLVITE ) 1 MG tablet Take 1 tablet (1 mg total) by mouth daily. 30 tablet 2   ibuprofen  (ADVIL ) 600 MG tablet Take 1 tablet (600 mg total) by mouth every 6 (six) hours as needed. 30 tablet 0   levocetirizine (XYZAL ) 5 MG tablet Take 1 tablet (5 mg total)  by mouth every evening. 30 tablet 5   metroNIDAZOLE  (METROGEL ) 0.75 % vaginal gel Place vaginally 2 (two) times daily.     Olopatadine-Mometasone (RYALTRIS ) 665-25 MCG/ACT SUSP Place 2 sprays into the nose 2 (two) times daily as needed. 29 g 5   pantoprazole  (PROTONIX ) 40 MG tablet Take 1 tablet (40 mg total) by mouth daily. 30 tablet 5   predniSONE  (DELTASONE ) 20 MG tablet Take 1 tablet (20 mg total) by mouth daily with breakfast. 10 tablet 0   Vitamin D , Ergocalciferol , (DRISDOL ) 1.25 MG (50000 UNIT) CAPS capsule Take 1 capsule (50,000 Units total) by mouth every 3 (three) days. 8 capsule 0   No current facility-administered medications for this visit.    SURGICAL HISTORY:  Past Surgical History:  Procedure Laterality Date   CESAREAN SECTION  2008; 2014   CESAREAN SECTION N/Horn 07/27/2017   Procedure: REPEAT CESAREAN SECTION;  Surgeon: Fredirick Glenys RAMAN, MD;  Location: [facility]ING [facility];  Service: Obstetrics;  Laterality: N/Horn;   FRACTURE SURGERY     ORIF ANKLE FRACTURE Right 10/28/2015   ORIF ANKLE FRACTURE Right 10/28/2015   Procedure: OPEN REDUCTION INTERNAL FIXATION (ORIF) RIGHT ANKLE FRACTURE;  Surgeon: Selinda Belvie Gosling, MD;  Location: [facility] OR;  Service: Orthopedics;  Laterality: Right;   TUBAL LIGATION Bilateral 07/27/2017   Procedure: BILATERAL TUBAL LIGATION;  Surgeon: Fredirick Glenys RAMAN, MD;  Location: [facility]IRTHING SUIT[facility];  Service: Obstetrics;  Laterality: Bilateral;    REVIEW OF SYSTEMS:   Review of Systems  Constitutional: Negative for appetite change, chills, fatigue, fever and unexpected weight change.  HENT:   Negative for mouth sores, nosebleeds, sore throat and trouble swallowing.   Eyes: Negative for eye problems and icterus.  Respiratory: Negative for cough, hemoptysis, shortness of breath and wheezing.   Cardiovascular: Negative for chest pain and leg swelling.  Gastrointestinal: Negative for abdominal pain, constipation, diarrhea, nausea and vomiting.  Genitourinary:  Negative for bladder incontinence, difficulty urinating, dysuria, frequency and hematuria.   Musculoskeletal: Negative for back pain, gait problem, neck pain and neck stiffness.  Skin: Negative for itching and rash.  Neurological: Negative for dizziness, extremity weakness, gait problem, headaches, light-headedness and seizures.  Hematological: Negative for adenopathy. Does not bruise/bleed easily.  Psychiatric/Behavioral: Negative for confusion, depression and sleep disturbance. The patient is not nervous/anxious.     PHYSICAL EXAMINATION:  There were no vitals taken for this visit.  ECOG PERFORMANCE STATUS: {CHL ONC ECOG [id number]  Physical Exam  Constitutional: Oriented to person, place, and time and well-developed, well-nourished, and in no distress. No distress.  HENT:  Head: Normocephalic and atraumatic.  Mouth/Throat: Oropharynx is clear and moist. No oropharyngeal exudate.  Eyes: Conjunctivae are normal. Right eye exhibits no discharge. Left eye exhibits no discharge. No scleral  icterus.  Neck: Normal range of motion. Neck supple.  Cardiovascular: Normal rate, regular rhythm, normal heart sounds and intact distal pulses.   Pulmonary/Chest: Effort normal and breath sounds normal. No respiratory distress. No wheezes. No rales.  Abdominal: Soft. Bowel sounds are normal. Exhibits no distension and no mass. There is no tenderness.  Musculoskeletal: Normal range of motion. Exhibits no edema.  Lymphadenopathy:    No cervical adenopathy.  Neurological: Alert and oriented to person, place, and time. Exhibits normal muscle tone. Gait normal. Coordination normal.  Skin: Skin is warm and dry. No rash noted. Not diaphoretic. No erythema. No pallor.  Psychiatric: Mood, memory and judgment normal.  Vitals reviewed.  LABORATORY DATA: Lab Results  Component Value Date   WBC 8.7 05/11/2023   HGB 12.0 05/11/2023   HCT 36.9 05/11/2023   MCV 89.3 05/11/2023   PLT 219 05/11/2023       Chemistry      Component Value Date/Time   NA 137 01/31/2023 1155   K 4.6 01/31/2023 1155   CL 99 01/31/2023 1155   CO2 21 01/31/2023 1155   BUN 11 01/31/2023 1155   CREATININE 0.72 01/31/2023 1155   CREATININE 0.76 03/07/2021 1130      Component Value Date/Time   CALCIUM 9.2 01/31/2023 1155   ALKPHOS 94 01/31/2023 1155   AST 14 01/31/2023 1155   AST 11 (L) 03/07/2021 1130   ALT 22 01/31/2023 1155   ALT 12 03/07/2021 1130   BILITOT 0.3 01/31/2023 1155   BILITOT 0.3 03/07/2021 1130       RADIOGRAPHIC STUDIES:  No results found.   ASSESSMENT/PLAN:  This is Horn very pleasant 38 year old African-American female with iron  deficiency anemia secondary to menorrhagia.  She also has reactive thrombocytosis secondary to her iron  deficiency.   The patient was seen by Dr. Sherrod in 2023.  She received 1 dose IV iron  but had an allergic reaction.  She did not receive any premedications.  She was treated with feraheme  in June 2024 and tolerated it well.    The patient had repeat lab studies today including CBC, iron  studies, ferritin, and folic acid .  Her labs from today demonstrate normal Hbg of ***Her other labs are pending.      I will send her Horn mychart message with the results.    I do not think she will require IV iron  but if she does have significant iron  deficiency, we would consider it. If she ever needs iron , I would recommend Feraheme  with premedications with Tylenol  and Benadryl .  I also encouraged her to be compliant with her oral iron  supplement 1 tablet p.o. daily with vitamin C. I will refill her folic acid  if it is low, she is taking Horn bariatric multivitamin that may have folic acid  in it.    She would like her labs checked prior to bariatric surgery which she anticipates will be in mid to late August. Therefore we will check her labs in late July and optimize her labs if needed.  We will see her back in ***   The patient was advised to call immediately if she has any  concerning symptoms in the interval. The patient voices understanding of current disease status and treatment options and is in agreement with the current care plan. All questions were answered. The patient knows to call the clinic with any problems, questions or concerns. We can certainly see the patient much sooner if necessary     No orders of the defined types  were placed in this encounter.    I spent {CHL ONC TIME VISIT - DTPQU:[id number]} counseling the patient face to face. The total time spent in the appointment was {CHL ONC TIME VISIT - DTPQU:[id number]}.  Athony Coppa L Seiya Silsby, PA-C 07/30/23

## 2023-07-31 ENCOUNTER — Ambulatory Visit: Admitting: Allergy & Immunology

## 2023-07-31 ENCOUNTER — Other Ambulatory Visit (HOSPITAL_COMMUNITY): Payer: Self-pay

## 2023-08-03 ENCOUNTER — Inpatient Hospital Stay: Admitting: Physician Assistant

## 2023-08-03 ENCOUNTER — Inpatient Hospital Stay: Attending: Physician Assistant

## 2023-08-06 ENCOUNTER — Other Ambulatory Visit: Payer: Self-pay | Admitting: Allergy & Immunology

## 2023-08-07 ENCOUNTER — Other Ambulatory Visit (HOSPITAL_COMMUNITY): Payer: Self-pay

## 2023-08-07 ENCOUNTER — Other Ambulatory Visit: Payer: Self-pay | Admitting: Allergy & Immunology

## 2023-08-07 ENCOUNTER — Telehealth: Admitting: Physician Assistant

## 2023-08-07 DIAGNOSIS — J4521 Mild intermittent asthma with (acute) exacerbation: Secondary | ICD-10-CM

## 2023-08-07 MED ORDER — ALBUTEROL SULFATE HFA 108 (90 BASE) MCG/ACT IN AERS
2.0000 | INHALATION_SPRAY | Freq: Four times a day (QID) | RESPIRATORY_TRACT | 1 refills | Status: DC | PRN
  Filled 2023-08-07: qty 18, 25d supply, fill #0

## 2023-08-07 MED ORDER — ALBUTEROL SULFATE HFA 108 (90 BASE) MCG/ACT IN AERS
2.0000 | INHALATION_SPRAY | Freq: Four times a day (QID) | RESPIRATORY_TRACT | 2 refills | Status: DC | PRN
Start: 2023-08-07 — End: 2023-11-25

## 2023-08-07 MED ORDER — PREDNISONE 20 MG PO TABS: 40.0000 mg | ORAL_TABLET | Freq: Every day | ORAL | 0 refills | Status: AC

## 2023-08-07 NOTE — Progress Notes (Signed)
 Virtual Visit Consent   Debra Horn, you are scheduled for a virtual visit with a  provider today. Just as with appointments in the office, your consent must be obtained to participate. Your consent will be active for this visit and any virtual visit you may have with one of our providers in the next 365 days. If you have a MyChart account, a copy of this consent can be sent to you electronically.  As this is a virtual visit, video technology does not allow for your provider to perform a traditional examination. This may limit your provider's ability to fully assess your condition. If your provider identifies any concerns that need to be evaluated in person or the need to arrange testing (such as labs, EKG, etc.), we will make arrangements to do so. Although advances in technology are sophisticated, we cannot ensure that it will always work on either your end or our end. If the connection with a video visit is poor, the visit may have to be switched to a telephone visit. With either a video or telephone visit, we are not always able to ensure that we have a secure connection.  By engaging in this virtual visit, you consent to the provision of healthcare and authorize for your insurance to be billed (if applicable) for the services provided during this visit. Depending on your insurance coverage, you may receive a charge related to this service.  I need to obtain your verbal consent now. Are you willing to proceed with your visit today? [name] has provided verbal consent on 08/07/2023 for a virtual visit (video or telephone). Harlene PEDLAR Ward, PA-C  Date: 08/07/2023 6:50 PM   Virtual Visit via Video Note   I, Harlene PEDLAR Ward, connected with  Debra Horn  ([id number], November 13, 1985) on 08/07/23 at  6:45 PM EDT by a video-enabled telemedicine application and verified that I am speaking with the correct person using two identifiers.  Location: Patient: Virtual Visit Location  Patient: Home Provider: Virtual Visit Location Provider: Home Office   I discussed the limitations of evaluation and management by telemedicine and the availability of in person appointments. The patient expressed understanding and agreed to proceed.    History of Present Illness: Debra Horn is a 38 y.o. who identifies as a female who was assigned female at birth, and is being seen today for congestion and cough that started about one week.  She is having some wheezing.  She has a h/o asthma, reports she is running low on rescue inhaler.  She reports using nasal spray with some improvement.   HPI: HPI  Problems:  Patient Active Problem List   Diagnosis Date Noted   Anemia 05/11/2023   Iron  deficiency anemia 05/25/2022   Shortness of breath 08/22/2021   Moderate persistent asthma with exacerbation 07/28/2021   Diffuse pain 05/31/2021   Allergic reaction 05/11/2021   Iron  deficiency anemia due to chronic blood loss 03/07/2021   Low folate 03/07/2021   Class 3 severe obesity with serious comorbidity and body mass index (BMI) of 40.0 to 44.9 in adult 03/03/2021   CRP elevated 12/01/2020   Elevated sed rate 12/01/2020   Nasal drainage 11/09/2020   Insulin  resistance 10/26/2020   Other chest pain 10/13/2020   Severe sleep apnea 06/24/2020   BMI 45.0-49.9, adult (HCC) 06/24/2020   Status post repeat low transverse cesarean section 07/27/2017   Poor fetal growth affecting management of mother in third trimester    Previous pregnancy  complicated by pregnancy-induced hypertension in third trimester, antepartum    IUGR (intrauterine growth restriction) in prior pregnancy, pregnant 03/06/2017   Vitamin D  deficiency 02/17/2017   History of preterm delivery 02/09/2017   History of asthma    Normocytic anemia 02/06/2016   Family history of systemic lupus erythematosus (SLE) in mother 02/19/2014   Hyperthyroidism 02/19/2014   Previous cesarean section 08/08/2012   History of pulmonary  embolus (PE) 07/24/2012    Allergies:  Allergies  Allergen Reactions   Banana Anaphylaxis   Cefixime Rash   Medications:  Current Outpatient Medications:    albuterol  (VENTOLIN  HFA) 108 (90 Base) MCG/ACT inhaler, Inhale 2 puffs into the lungs every 6 (six) hours as needed for wheezing or shortness of breath., Disp: 8 g, Rfl: 2   predniSONE  (DELTASONE ) 20 MG tablet, Take 2 tablets (40 mg total) by mouth daily with breakfast for 5 days., Disp: 10 tablet, Rfl: 0   albuterol  (VENTOLIN  HFA) 108 (90 Base) MCG/ACT inhaler, Inhale 2 puffs into the lungs every 6 (six) hours as needed for wheezing or shortness of breath., Disp: 18 g, Rfl: 1   budesonide -glycopyrrolate -formoterol  (BREZTRI  AEROSPHERE) 160-9-4.8 MCG/ACT AERO inhaler, Inhale 2 puffs into the lungs in the morning and at bedtime., Disp: 10.7 g, Rfl: 5   doxycycline  (VIBRAMYCIN ) 100 MG capsule, Take 1 capsule (100 mg total) by mouth 2 (two) times daily., Disp: 20 capsule, Rfl: 0   EPINEPHrine  0.3 mg/0.3 mL IJ SOAJ injection, Inject 0.3 mg into the muscle as needed for anaphylaxis., Disp: 1 each, Rfl: 1   famotidine  (PEPCID ) 40 MG tablet, Take 1 tablet (40 mg total) by mouth daily., Disp: 90 tablet, Rfl: 1   FLUoxetine  (PROZAC ) 20 MG capsule, Take 1 capsule (20 mg total) by mouth daily., Disp: 90 capsule, Rfl: 0   fluticasone  (FLOVENT  HFA) 110 MCG/ACT inhaler, Inhale 2 puffs into the lungs in the morning and at bedtime., Disp: 12 g, Rfl: 5   folic acid  (FOLVITE ) 1 MG tablet, Take 1 tablet (1 mg total) by mouth daily., Disp: 30 tablet, Rfl: 2   ibuprofen  (ADVIL ) 600 MG tablet, Take 1 tablet (600 mg total) by mouth every 6 (six) hours as needed., Disp: 30 tablet, Rfl: 0   levocetirizine (XYZAL ) 5 MG tablet, Take 1 tablet (5 mg total) by mouth every evening., Disp: 30 tablet, Rfl: 5   metroNIDAZOLE  (METROGEL ) 0.75 % vaginal gel, Place vaginally 2 (two) times daily., Disp: , Rfl:    Olopatadine-Mometasone (RYALTRIS ) 665-25 MCG/ACT SUSP, Place 2  sprays into the nose 2 (two) times daily as needed., Disp: 29 g, Rfl: 5   pantoprazole  (PROTONIX ) 40 MG tablet, Take 1 tablet (40 mg total) by mouth daily., Disp: 30 tablet, Rfl: 5   Vitamin D , Ergocalciferol , (DRISDOL ) 1.25 MG (50000 UNIT) CAPS capsule, Take 1 capsule (50,000 Units total) by mouth every 3 (three) days., Disp: 8 capsule, Rfl: 0  Observations/Objective: Patient is well-developed, well-nourished in no acute distress.  Resting comfortably at home.  Head is normocephalic, atraumatic.  No labored breathing.  Speech is clear and coherent with logical content.  Patient is alert and oriented at baseline.    Assessment and Plan: 1. Mild intermittent asthma with exacerbation (Primary)  Will send in refill of albuterol  inhaler.  Will prescribe prednisone  for asthma exacerbation.  Supportive care discussed.  In person evaluation precautions discussed.   Follow Up Instructions: I discussed the assessment and treatment plan with the patient. The patient was provided an opportunity to ask questions and  all were answered. The patient agreed with the plan and demonstrated an understanding of the instructions.  A copy of instructions were sent to the patient via MyChart unless otherwise noted below.     The patient was advised to call back or seek an in-person evaluation if the symptoms worsen or if the condition fails to improve as anticipated.    Harlene PEDLAR Ward, PA-C

## 2023-08-07 NOTE — Patient Instructions (Signed)
 Tinnie [name], thank you for joining Harlene PEDLAR Ward, PA-C for today's virtual visit.  While this provider is not your primary care provider (PCP), if your PCP is located in our provider database this encounter information will be shared with them immediately following your visit.   A [facility] MyChart account gives you access to today's visit and all your visits, tests, and labs performed at [facility]  click here if you don't have a [facility] MyChart account or go to [email or web address]  Consent: (Patient) Katryna A Runnion provided verbal consent for this virtual visit at the beginning of the encounter.  Current Medications:  Current Outpatient Medications:    albuterol  (VENTOLIN  HFA) 108 (90 Base) MCG/ACT inhaler, Inhale 2 puffs into the lungs every 6 (six) hours as needed for wheezing or shortness of breath., Disp: 8 g, Rfl: 2   predniSONE  (DELTASONE ) 20 MG tablet, Take 2 tablets (40 mg total) by mouth daily with breakfast for 5 days., Disp: 10 tablet, Rfl: 0   albuterol  (VENTOLIN  HFA) 108 (90 Base) MCG/ACT inhaler, Inhale 2 puffs into the lungs every 6 (six) hours as needed for wheezing or shortness of breath., Disp: 18 g, Rfl: 1   budesonide -glycopyrrolate -formoterol  (BREZTRI  AEROSPHERE) 160-9-4.8 MCG/ACT AERO inhaler, Inhale 2 puffs into the lungs in the morning and at bedtime., Disp: 10.7 g, Rfl: 5   doxycycline  (VIBRAMYCIN ) 100 MG capsule, Take 1 capsule (100 mg total) by mouth 2 (two) times daily., Disp: 20 capsule, Rfl: 0   EPINEPHrine  0.3 mg/0.3 mL IJ SOAJ injection, Inject 0.3 mg into the muscle as needed for anaphylaxis., Disp: 1 each, Rfl: 1   famotidine  (PEPCID ) 40 MG tablet, Take 1 tablet (40 mg total) by mouth daily., Disp: 90 tablet, Rfl: 1   FLUoxetine  (PROZAC ) 20 MG capsule, Take 1 capsule (20 mg total) by mouth daily., Disp: 90 capsule, Rfl: 0   fluticasone  (FLOVENT  HFA) 110 MCG/ACT inhaler, Inhale 2 puffs into the lungs in the morning and at  bedtime., Disp: 12 g, Rfl: 5   folic acid  (FOLVITE ) 1 MG tablet, Take 1 tablet (1 mg total) by mouth daily., Disp: 30 tablet, Rfl: 2   ibuprofen  (ADVIL ) 600 MG tablet, Take 1 tablet (600 mg total) by mouth every 6 (six) hours as needed., Disp: 30 tablet, Rfl: 0   levocetirizine (XYZAL ) 5 MG tablet, Take 1 tablet (5 mg total) by mouth every evening., Disp: 30 tablet, Rfl: 5   metroNIDAZOLE  (METROGEL ) 0.75 % vaginal gel, Place vaginally 2 (two) times daily., Disp: , Rfl:    Olopatadine-Mometasone (RYALTRIS ) 665-25 MCG/ACT SUSP, Place 2 sprays into the nose 2 (two) times daily as needed., Disp: 29 g, Rfl: 5   pantoprazole  (PROTONIX ) 40 MG tablet, Take 1 tablet (40 mg total) by mouth daily., Disp: 30 tablet, Rfl: 5   Vitamin D , Ergocalciferol , (DRISDOL ) 1.25 MG (50000 UNIT) CAPS capsule, Take 1 capsule (50,000 Units total) by mouth every 3 (three) days., Disp: 8 capsule, Rfl: 0   Medications ordered in this encounter:  Meds ordered this encounter  Medications   albuterol  (VENTOLIN  HFA) 108 (90 Base) MCG/ACT inhaler    Sig: Inhale 2 puffs into the lungs every 6 (six) hours as needed for wheezing or shortness of breath.    Dispense:  8 g    Refill:  2    Supervising Provider:   BLAISE ALEENE KIDD [[id number]]   predniSONE  (DELTASONE ) 20 MG tablet    Sig: Take 2 tablets (40 mg total) by mouth daily  with breakfast for 5 days.    Dispense:  10 tablet    Refill:  0    Supervising Provider:   BLAISE ALEENE KIDD [[id number]]     *If you need refills on other medications prior to your next appointment, please contact your pharmacy*  Follow-Up: Call back or seek an in-person evaluation if the symptoms worsen or if the condition fails to improve as anticipated.  [facility] [phone]  Other Instructions Take prednisone  as prescribed. Use inhaler as needed.  Can continue with nasal spray.  Recommend Robitussin for cough.  If no improvement or symptoms become worse recommend in person  evaluation.    If you have been instructed to have an in-person evaluation today at a local Urgent Care facility, please use the link below. It will take you to a list of all of our available [facility], including address, phone number and hours of operation. Please do not delay care.  [facility]  If you or a family member do not have a primary care provider, use the link below to schedule a visit and establish care. When you choose a [facility] primary care physician or advanced practice provider, you gain a long-term partner in health. Find a Primary Care Provider  Learn more about [facility]'s in-office and virtual care options: [facility] - Get Care Now

## 2023-08-07 NOTE — Progress Notes (Unsigned)
 08/08/2023 Debra Horn [id number] 1985-11-19  Referring provider: Lendia Boby CROME, NP-C Primary GI doctor: Dr. San  ASSESSMENT AND PLAN:  GERD with globus sensation and occ dysphagia With initiation of wegovy  over 6 months ago, was having globus sensation in her throat, regurg of acid/food She uses ibuprofen  which she has since stopped, having weight loss surgery on the 19th with Dr. Cameron  Started on pantoprazole  and pepcid  which resolved her symptoms but then she had a soda about 2 weeks ago and the symptoms returned and have not abated Likely LPR, possible stricture had Schatzki ring on upper GI 04/2022, less likely EOE with acute onset - continue pantoprazole  but increase to twice a day for 2 weeks, then once a day of the pantoprazole  -alginate therapy given -Lifestyle changes discussed, avoid NSAIDS, ETOH, hand out given to the patient -Weight loss discussed with the patient - Discussed case with Dr. San and sent Dr. Cameron a message in regards to next steps with this patient. - Uncertain if they want us  to pursue EGD in the hospital setting which may take a while and delay her surgery versus if they want to do preoperative EGD and base proceeding off of that.  OSA with asthma  Normocytic anemia with IDA/folate def/B12 def 05/11/2023  HGB 12.0 MCV 89.3 Platelets 219 05/11/2023 Iron  31 Ferritin 16 B12 401 No overt GI bleeding potentially secondary menses Recent Labs    11/24/22 0419 12/07/22 0323 05/11/23 0819  HGB 12.6 12.8 12.0   History of PE Not on blood thinner at this time  Morbid obesity  Body mass index is 51.03 kg/m.  -Patient has been advised to make an attempt to improve diet and exercise patterns to aid in weight loss. -Recommended diet heavy in fruits and veggies and low in animal meats, cheeses, and dairy products, appropriate calorie intake Any endoscopic evaluation would have to be done in the hospital setting   Patient Care  Team: Lendia Boby CROME, NP-C as PCP - General (Family Medicine) Meade Verdon RAMAN, MD as Consulting Physician (Pulmonary Disease)  HISTORY OF PRESENT ILLNESS: 38 y.o. female with a past medical history listed below presents for evaluation of GERD.   Discussed the use of AI scribe software for clinical note transcription with the patient, who gave verbal consent to proceed.  History of Present Illness   Debra Horn is a 38 year old female who presents with heartburn and acid reflux symptoms.  She began experiencing symptoms of acid reflux after starting the Wegovy  shot approximately six months ago. Initially, she did not have any issues until the dose of Wegovy  was increased, leading to a sensation in her throat, which she initially thought was an allergic reaction. Despite visiting urgent care and undergoing allergy  testing, the sensation persisted.  She was prescribed Protonix  and Pepcid , which she has been taking regularly. These medications have helped manage her symptoms, although she still experiences a sensation in her throat, particularly after consuming soda. She associates the recurrence of symptoms with sugar intake.  Her symptoms include a feeling of something in her throat, occasional acid sensation, chest discomfort, and shortness of breath. No vomiting, significant nausea, or regurgitation of food. Swallowing is currently not problematic with the medication.  She has a history of using ibuprofen  for headaches and body aches. She denies alcohol use, smoking, or drug use. She has mild seasonal allergies but no history of asthma, eczema, or significant gastrointestinal bleeding.  An upper GI series in  April 2024 was performed. She has not had an endoscopy but is scheduled for weight loss surgery on August 21, 2023. She continues to take pantoprazole  and Pepcid  for her symptoms.        She  reports that she has never smoked. She has never used smokeless tobacco. She reports that  she does not drink alcohol and does not use drugs.  RELEVANT GI HISTORY, IMAGING AND LABS: Results   RADIOLOGY Chest x-ray: Normal (05/10/2023)  DIAGNOSTIC Upper GI series: Schatzki ring (04/2022)      CBC    Component Value Date/Time   WBC 8.7 05/11/2023 0819   WBC 9.6 12/07/2022 0323   RBC 4.13 05/11/2023 0819   HGB 12.0 05/11/2023 0819   HGB 11.3 02/09/2017 1233   HCT 36.9 05/11/2023 0819   HCT 34.5 02/09/2017 1233   PLT 219 05/11/2023 0819   PLT 378 02/09/2017 1233   MCV 89.3 05/11/2023 0819   MCV 85 02/09/2017 1233   MCH 29.1 05/11/2023 0819   MCHC 32.5 05/11/2023 0819   RDW 14.4 05/11/2023 0819   RDW 16.3 (H) 02/09/2017 1233   LYMPHSABS 4.1 (H) 05/11/2023 0819   LYMPHSABS 2.8 02/09/2017 1233   MONOABS 0.7 05/11/2023 0819   EOSABS 0.1 05/11/2023 0819   EOSABS 0.5 (H) 02/09/2017 1233   BASOSABS 0.0 05/11/2023 0819   BASOSABS 0.0 02/09/2017 1233   Recent Labs    11/24/22 0419 12/07/22 0323 05/11/23 0819  HGB 12.6 12.8 12.0    CMP     Component Value Date/Time   NA 137 01/31/2023 1155   K 4.6 01/31/2023 1155   CL 99 01/31/2023 1155   CO2 21 01/31/2023 1155   GLUCOSE 109 (H) 01/31/2023 1155   GLUCOSE 111 (H) 12/07/2022 0323   BUN 11 01/31/2023 1155   CREATININE 0.72 01/31/2023 1155   CREATININE 0.76 03/07/2021 1130   CALCIUM 9.2 01/31/2023 1155   PROT 6.9 01/31/2023 1155   ALBUMIN 3.9 01/31/2023 1155   AST 14 01/31/2023 1155   AST 11 (L) 03/07/2021 1130   ALT 22 01/31/2023 1155   ALT 12 03/07/2021 1130   ALKPHOS 94 01/31/2023 1155   BILITOT 0.3 01/31/2023 1155   BILITOT 0.3 03/07/2021 1130   GFRNONAA >60 12/07/2022 0323   GFRNONAA >60 03/07/2021 1130   GFRAA >60 07/31/2017 1441      Latest Ref Rng & Units 01/31/2023   11:55 AM 12/07/2022    3:23 AM 11/24/2022    4:19 AM  Hepatic Function  Total Protein 6.0 - 8.5 g/dL 6.9  7.4  7.3   Albumin 3.9 - 4.9 g/dL 3.9  3.6  3.4   AST 0 - 40 IU/L 14  22  25    ALT 0 - 32 IU/L 22  30  23    Alk  Phosphatase 44 - 121 IU/L 94  98  83   Total Bilirubin 0.0 - 1.2 mg/dL 0.3  0.5  0.9       Current Medications:   Current Outpatient Medications (Endocrine & Metabolic):    predniSONE  (DELTASONE ) 20 MG tablet, Take 2 tablets (40 mg total) by mouth daily with breakfast for 5 days.  Current Outpatient Medications (Cardiovascular):    EPINEPHrine  0.3 mg/0.3 mL IJ SOAJ injection, Inject 0.3 mg into the muscle as needed for anaphylaxis.  Current Outpatient Medications (Respiratory):    albuterol  (VENTOLIN  HFA) 108 (90 Base) MCG/ACT inhaler, Inhale 2 puffs into the lungs every 6 (six) hours as needed for wheezing or shortness  of breath.   albuterol  (VENTOLIN  HFA) 108 (90 Base) MCG/ACT inhaler, Inhale 2 puffs into the lungs every 6 (six) hours as needed for wheezing or shortness of breath.   fluticasone  (FLOVENT  HFA) 110 MCG/ACT inhaler, Inhale 2 puffs into the lungs in the morning and at bedtime.   levocetirizine (XYZAL ) 5 MG tablet, Take 1 tablet (5 mg total) by mouth every evening.   Olopatadine-Mometasone (RYALTRIS ) 665-25 MCG/ACT SUSP, Place 2 sprays into the nose 2 (two) times daily as needed.   budesonide -glycopyrrolate -formoterol  (BREZTRI  AEROSPHERE) 160-9-4.8 MCG/ACT AERO inhaler, Inhale 2 puffs into the lungs in the morning and at bedtime. (Patient not taking: Reported on 08/08/2023)  Current Outpatient Medications (Analgesics):    ibuprofen  (ADVIL ) 600 MG tablet, Take 1 tablet (600 mg total) by mouth every 6 (six) hours as needed.  Current Outpatient Medications (Hematological):    folic acid  (FOLVITE ) 1 MG tablet, Take 1 tablet (1 mg total) by mouth daily.  Current Outpatient Medications (Other):    famotidine  (PEPCID ) 40 MG tablet, Take 1 tablet (40 mg total) by mouth daily.   doxycycline  (VIBRAMYCIN ) 100 MG capsule, Take 1 capsule (100 mg total) by mouth 2 (two) times daily.   FLUoxetine  (PROZAC ) 20 MG capsule, Take 1 capsule (20 mg total) by mouth daily. (Patient not taking:  Reported on 08/08/2023)   metroNIDAZOLE  (METROGEL ) 0.75 % vaginal gel, Place vaginally 2 (two) times daily.   pantoprazole  (PROTONIX ) 40 MG tablet, Take 1 tablet (40 mg total) by mouth 2 (two) times daily.   Vitamin D , Ergocalciferol , (DRISDOL ) 1.25 MG (50000 UNIT) CAPS capsule, Take 1 capsule (50,000 Units total) by mouth every 3 (three) days. (Patient not taking: Reported on 08/08/2023)  Medical History:  Past Medical History:  Diagnosis Date   Anemia    low iron    Asthma    Complication of anesthesia    bp dropped in middle of last c-section and started with n/v   GERD (gastroesophageal reflux disease)    during pregnancy   Headache    couple days/week (10/28/2015)   Hyperthyroidism    not on medication   PE (pulmonary embolism) 11/2011   PONV (postoperative nausea and vomiting)    Pregnancy induced hypertension    Preterm labor    Sleep apnea    SOB (shortness of breath)    Thyroid  condition    Vitamin D  deficiency    Allergies:  Allergies  Allergen Reactions   Banana Anaphylaxis   Cefixime Rash     Surgical History:  She  has a past surgical history that includes Cesarean section (2008; 2014); ORIF ankle fracture (Right, 10/28/2015); Fracture surgery; ORIF ankle fracture (Right, 10/28/2015); Cesarean section (N/A, 07/27/2017); and Tubal ligation (Bilateral, 07/27/2017). Family History:  Her family history includes Arthritis in her mother; Asthma in her father and maternal grandfather; Cancer in her father, maternal grandmother, paternal grandmother, and sister; Diabetes in her father and paternal grandmother; Hyperlipidemia in her paternal grandmother; Hypertension in her father, maternal grandfather, and paternal grandmother; Hypothyroidism in her maternal aunt; Lupus in her mother and another family member; Rheum arthritis in her mother; Stroke in her maternal grandfather.  REVIEW OF SYSTEMS  : All other systems reviewed and negative except where noted in the History of  Present Illness.  PHYSICAL EXAM: BP 136/80   Pulse 77   Ht 5' 2 (1.575 m)   Wt 279 lb (126.6 kg)   BMI 51.03 kg/m  Physical Exam   GENERAL APPEARANCE: Obese in no apparent distress HEENT:  No cervical lymphadenopathy, unremarkable thyroid , sclerae anicteric, conjunctiva pink RESPIRATORY: Respiratory effort normal, breath sounds equal bilaterally without rales, rhonchi, or wheezing CARDIO: Regular rate and rhythm with no murmurs, rubs, or gallops, peripheral pulses intact ABDOMEN: Soft, non-distended, active bowel sounds in all four quadrants, non-tender to palpation, no rebound, no mass appreciated RECTAL: Declines MUSCULOSKELETAL: Full range of motion, normal gait, without edema SKIN: Dry, intact without rashes or lesions. No jaundice. NEURO: Alert, oriented, no focal deficits PSYCH: Cooperative, normal mood and affect.      Alan JONELLE Coombs, PA-C 12:18 PM

## 2023-08-08 ENCOUNTER — Other Ambulatory Visit (HOSPITAL_COMMUNITY): Payer: Self-pay

## 2023-08-08 ENCOUNTER — Encounter: Payer: Self-pay | Admitting: Physician Assistant

## 2023-08-08 ENCOUNTER — Ambulatory Visit (INDEPENDENT_AMBULATORY_CARE_PROVIDER_SITE_OTHER): Admitting: Physician Assistant

## 2023-08-08 ENCOUNTER — Other Ambulatory Visit (HOSPITAL_COMMUNITY): Payer: Self-pay | Admitting: Surgery

## 2023-08-08 VITALS — BP 136/80 | HR 77 | Ht 62.0 in | Wt 279.0 lb

## 2023-08-08 DIAGNOSIS — Z6841 Body Mass Index (BMI) 40.0 and over, adult: Secondary | ICD-10-CM

## 2023-08-08 DIAGNOSIS — R1310 Dysphagia, unspecified: Secondary | ICD-10-CM | POA: Diagnosis not present

## 2023-08-08 DIAGNOSIS — D509 Iron deficiency anemia, unspecified: Secondary | ICD-10-CM | POA: Diagnosis not present

## 2023-08-08 DIAGNOSIS — D6489 Other specified anemias: Secondary | ICD-10-CM | POA: Diagnosis not present

## 2023-08-08 DIAGNOSIS — Z86711 Personal history of pulmonary embolism: Secondary | ICD-10-CM

## 2023-08-08 DIAGNOSIS — G4730 Sleep apnea, unspecified: Secondary | ICD-10-CM

## 2023-08-08 DIAGNOSIS — K219 Gastro-esophageal reflux disease without esophagitis: Secondary | ICD-10-CM

## 2023-08-08 DIAGNOSIS — E0590 Thyrotoxicosis, unspecified without thyrotoxic crisis or storm: Secondary | ICD-10-CM | POA: Diagnosis not present

## 2023-08-08 DIAGNOSIS — J45909 Unspecified asthma, uncomplicated: Secondary | ICD-10-CM | POA: Diagnosis not present

## 2023-08-08 DIAGNOSIS — E559 Vitamin D deficiency, unspecified: Secondary | ICD-10-CM | POA: Diagnosis not present

## 2023-08-08 DIAGNOSIS — I2699 Other pulmonary embolism without acute cor pulmonale: Secondary | ICD-10-CM | POA: Diagnosis not present

## 2023-08-08 DIAGNOSIS — E538 Deficiency of other specified B group vitamins: Secondary | ICD-10-CM | POA: Diagnosis not present

## 2023-08-08 DIAGNOSIS — R09A2 Foreign body sensation, throat: Secondary | ICD-10-CM | POA: Diagnosis not present

## 2023-08-08 DIAGNOSIS — D649 Anemia, unspecified: Secondary | ICD-10-CM

## 2023-08-08 DIAGNOSIS — Z9889 Other specified postprocedural states: Secondary | ICD-10-CM | POA: Diagnosis not present

## 2023-08-08 DIAGNOSIS — G4733 Obstructive sleep apnea (adult) (pediatric): Secondary | ICD-10-CM | POA: Diagnosis not present

## 2023-08-08 DIAGNOSIS — E88819 Insulin resistance, unspecified: Secondary | ICD-10-CM | POA: Diagnosis not present

## 2023-08-08 DIAGNOSIS — R112 Nausea with vomiting, unspecified: Secondary | ICD-10-CM | POA: Diagnosis not present

## 2023-08-08 DIAGNOSIS — R519 Headache, unspecified: Secondary | ICD-10-CM | POA: Diagnosis not present

## 2023-08-08 MED ORDER — FAMOTIDINE 40 MG PO TABS
40.0000 mg | ORAL_TABLET | Freq: Every day | ORAL | 1 refills | Status: DC
  Filled 2023-08-08: qty 90, 90d supply, fill #0

## 2023-08-08 MED ORDER — PANTOPRAZOLE SODIUM 40 MG PO TBEC: 40.0000 mg | DELAYED_RELEASE_TABLET | Freq: Two times a day (BID) | ORAL | 0 refills | Status: DC

## 2023-08-08 NOTE — Patient Instructions (Addendum)
 Increase pantoprazole  40 mg twice a day for 2 weeks and then back to once a day Will discuss with Dr. Cameron roll of EGD prior to surgery or if she is doing one  Reflux Gourmet Rescue  It is an ALGINATE THERAPY which is the only intervention that works to safeguard the esophagus by creating a protective barrier that actually stops reflux from happening. -The general directions for use are as stated on the packaging: Take 1 teaspoon (5 ml), or more as needed or as directed by your physician, after meals and before bed. -These general directions address the most common times for reflux to occur, but our Rescue products may be taken anytime. Some individuals may take our product preemptively, when they know they will suffer from reflux, or as needed - when discomfort arises. (If taken around food, it should be consumed last.) -You do not have to take 1 teaspoon (5 ml) of the product. While one teaspoon (5ml) may be the perfect average amount to relieve reflux suffering in some, others may require more or less. You may adjust the amount of Mint Chocolate Rescue and Vanilla Caramel Rescue to the lowest amount necessary to meet your individual needs to improve your quality of life. -You may dilute the product if it is too viscous for you to consume. Keep in mind, however, that the thickness of the product was formulated to provide optimal coating and protection of your throat and esophagus. Though diluting the product is possible, it may reduce the protective function and/or length of action. -This can be used in conjunction with reflux medications and lifestyle changes.  100% ALL-NATURAL  Paraben FREE, glycerin  FREE, & potassium FREE  Made entirely from all-natural ingredients considered safe for children and during pregnancy  No known side effects  All-natural flavor Gluten FREE  Allergen FREE  Vegan  Can find more information here: [employer].[email or web address]  Silent  reflux: Not all heartburn burns...SABRASABRASABRA  What is LPR? Laryngopharyngeal reflux (LPR) or silent reflux is a condition in which acid that is made in the stomach travels up the esophagus (swallowing tube) and gets to the throat. Not everyone with reflux has a lot of heartburn or indigestion. In fact, many people with LPR never have heartburn. This is why LPR is called SILENT REFLUX, and the terms Silent reflux and LPR are often used interchangeably. Because LPR is silent, it is sometimes difficult to diagnose.  How can you tell if you have LPR?  Chronic hoarseness- Some people have hoarseness that comes and goes throat clearing  Cough It can cause shortness of breath and cause asthma like symptoms. a feeling of a lump in the throat  difficulty swallowing a problem with too much nose and throat drainage.  Some people will feel their esophagus spasm which feels like their heart beating hard and fast, this will usually be after a meal, at rest, or lying down at night.    How do I treat this? Treatment for LPR should be individualized, and your doctor will suggest the best treatment for you. Generally there are several treatments for LPR: changing habits and diet to reduce reflux,  medications to reduce stomach acid, and  surgery to prevent reflux. Most people with LPR need to modify how and when they eat, as well as take some medication, to get well. Sometimes, nonprescription liquid antacids, such as Maalox, Gelucil and Mylanta are recommended. When used, these antacids should be taken four times each day - one tablespoon one hour after each  meal and before bedtime. Dietary and lifestyle changes alone are not often enough to control LPR - medications that reduce stomach acid are also usually needed. These must be prescribed by our doctor.   TIPS FOR REDUCING REFLUX AND LPR Control your LIFE-STYLE and your DIET! If you use tobacco, QUIT.  Smoking makes you reflux. After every cigarette you  have some LPR.  Don't wear clothing that is too tight, especially around the waist (trousers, corsets, belts).  Do not lie down just after eating...in fact, do not eat within three hours of bedtime.  You should be on a low-fat diet.  Limit your intake of red meat.  Limit your intake of butter.  Avoid fried foods.  Avoid chocolate  Avoid cheese.  Avoid eggs. Specifically avoid caffeine  (especially coffee and tea), soda pop (especially cola) and mints.  Avoid alcoholic beverages, particularly in the evening. I appreciate the  opportunity to care for you  Thank You   [facility]

## 2023-08-08 NOTE — Progress Notes (Signed)
 Agree with the assessment and plan as outlined by Quentin Mulling, PA-C. ? ?Keron Neenan, DO, FACG ? ?

## 2023-08-13 ENCOUNTER — Ambulatory Visit: Payer: Self-pay | Admitting: Surgery

## 2023-08-13 NOTE — Progress Notes (Addendum)
 COVID Vaccine received:  []  No [x]  Yes Date of any COVID positive Test in last 90 days: no PCP - Boby Mackintosh NP-C Cardiologist - n/a  Chest x-ray -  EKG -  12/07/22 Epic Stress Test - 11/24/20 Epic ECHO - 12/14/20 Epic Cardiac Cath -   Bowel Prep - [x]  No  []   Yes ______  Pacemaker / ICD device [x]  No []  Yes   Spinal Cord Stimulator:[x]  No []  Yes       History of Sleep Apnea? []  No [x]  Yes   CPAP used?- []  No [x]  Yes    Does the patient monitor blood sugar?          [x]  No []  Yes  []  N/A  Patient has: [x]  NO Hx DM   []  Pre-DM                 []  DM1  []   DM2 Does patient have a [employer] or Dexacom? []  No []  Yes   Fasting Blood Sugar Ranges-  Checks Blood Sugar _____ times a day  GLP1 agonist / usual dose - no GLP1 instructions:  SGLT-2 inhibitors / usual dose - no SGLT-2 instructions:   Blood Thinner / Instructions:no Aspirin  Instructions:no  Comments:   Activity level: Patient is able to climb a flight of stairs without difficulty; [x]  No CP  [x]  No SOB,    Patient can perform ADLs without assistance.   Anesthesia review:   Patient denies shortness of breath, fever, cough and chest pain at PAT appointment.  Patient verbalized understanding and agreement to the Pre-Surgical Instructions that were given to them at this PAT appointment. Patient was also educated of the need to review these PAT instructions again prior to his/her surgery.I reviewed the appropriate phone numbers to call if they have any and questions or concerns.

## 2023-08-13 NOTE — H&P (View-Only) (Signed)
 Debra Horn [id number]   Referring Provider: Self  Subjective   Chief Complaint: Pre-op Exam (LSG and upper endo/)   History of Present Illness:  Follow-up regarding surgical treatment of morbid obesity. She did have a recurrence of her globus sensation after consuming some soda and birthday cake at her birthday party and has subsequently followed up with GI. Concern for laryngopharyngeal reflux is an ongoing issue after Wegovy . She reports that her symptoms are very minimal, she describes a knot in her throat which is a very mild sensation, no postprandial exacerbation or typical reflux symptoms. She has been prescribed twice daily Protonix  but has not started this yet. Otherwise has completed the bariatric pathway and is currently scheduled for surgery on 8/19.  5/8: Returns to pursue surgery for treatment of morbid obesity. She did have her upper GI last year which was normal, no reflux or hiatal hernia. Chest x-ray also normal at that time. Last visit with [facility] was last week, this was visit #30 and her weight was unchanged at 269 pounds. Had weaned off of soda and juice, occasional Sprite with a max intake of 2- 20oz servings per day. She had lab work done in the end of January this year including an unremarkable CMP; vitamin B12 is normal, D was low at 22.6 and this is being managed at [facility], hemoglobin A1c 5.7, insulin  elevated at 28.3, TSH/T4 normal, CBC in December 2024 unremarkable; hemoglobin 12.8. She remains committed to aggressive treatment of morbid obesity and remains interested in sleeve gastrectomy. She did try Wegovy  briefly over the course of the year but it did not work for her and she developed a globus sensation which does improve antireflux medications. No overt heartburn or reflux symptoms otherwise.  April 1370: 38 year old woman who presents for consultation regarding surgical management of morbid obesity. She has followed with [facility] in September 2022, and while she reports that she has dramatically improved her relationship with food, meal planning and prepping, she has only lost 11 pounds in that interval. Her insurance will not cover any form of GLP-1 therapy. One of her biggest concerns surrounding her weight is that she has severe sleep apnea. She has had some frightening events surrounding this. She also has asthma and chronic issues with shortness of breath. She has been told by many of her physicians that much of this will improve with weight loss. She has made an earnest effort to do this but with minimal and transient success. She reports that she really does not eat much. She does not exercise. She works from home, in [employer] for [employer]. She does have a special needs son. Previous abdominal surgery includes C-section x 3, tubal ligation 2019 with last C-section  Cardiac cath in 2022 was negative  Family history notable for lupus and rheumatoid arthritis in her mother, hypertension and diabetes as well as cancer in her father, as well as hypertension, stroke, diabetes other family members. She reports family history of obesity on her mother side but not much on the father side.  No nicotine/alcohol/drug use  Review of Systems: A complete review of systems was obtained from the patient. I have reviewed this information and discussed as appropriate with the patient. See HPI as well for other ROS.  Medical History: Past Medical History:  Diagnosis Date  Anxiety  Asthma, unspecified asthma severity, unspecified whether complicated, unspecified whether persistent (HHS-HCC)  DVT (deep venous thrombosis) (CMS/HHS-HCC)  Sleep apnea  There is no problem list on file for this patient.  Past Surgical History:  Procedure Laterality Date  ARTHROSCOPY ANKLE FOR REPAIR FRACTURE  CESAREAN SECTION    No Known Allergies  Current Outpatient Medications on File Prior to Visit  Medication Sig  Dispense Refill  albuterol  MDI, PROVENTIL , VENTOLIN , PROAIR , HFA 90 mcg/actuation inhaler TAKE 2 PUFFS BY MOUTH EVERY 6 HOURS AS NEEDED FOR WHEEZE OR SHORTNESS OF BREATH  albuterol  MDI, PROVENTIL , VENTOLIN , PROAIR , HFA 90 mcg/actuation inhaler Inhale 2 inhalations into the lungs every 6 (six) hours as needed for Wheezing 1 each 2  BREZTRI  AEROSPHERE 160-9-4.8 mcg/actuation inhaler Inhale 2 inhalations into the lungs  diphenhydrAMINE  (BENADRYL ) 25 mg capsule Take 25 mg by mouth every 6 (six) hours as needed for Itching  EPINEPHrine  (EPIPEN ) 0.3 mg/0.3 mL auto-injector Inject 0.3 mg into the muscle as needed for Anaphylaxis  FLUoxetine  (PROZAC ) 10 MG capsule Take by mouth  fluticasone  propionate (FLOVENT  HFA) 110 mcg/actuation inhaler Inhale into the lungs  levocetirizine (XYZAL ) 5 MG tablet Take 5 mg by mouth every evening  pantoprazole  (PROTONIX ) 40 MG DR tablet Take 40 mg by mouth 2 (two) times daily  predniSONE  (DELTASONE ) 20 MG tablet Take 40 mg by mouth  RYALTRIS  665-25 mcg/spray Spry Place 2 sprays into one nostril  ergocalciferol , vitamin D2, 1,250 mcg (50,000 unit) capsule TAKE 1 CAPSULE BY MOUTH ONCE PER WEEK  metFORMIN  (GLUCOPHAGE -XR) 500 MG XR tablet TAKE 1 TABLET EVERY DAY WITH FOOD   No current facility-administered medications on file prior to visit.   Family History  Problem Relation Age of Onset  Diabetes Father  High blood pressure (Hypertension) Father    Social History   Tobacco Use  Smoking Status Never  Smokeless Tobacco Never    Social History   Socioeconomic History  Marital status: Married  Tobacco Use  Smoking status: Never  Smokeless tobacco: Never  Vaping Use  Vaping status: Never Used  Substance and Sexual Activity  Alcohol use: Not Currently  Drug use: Never   Objective:   Vitals:  08/08/23 1607 08/08/23 1608  BP: (!) 145/87  Pulse: 85  Temp: 36.7 C (98 F)  SpO2: 99%  Weight: (!) 126.8 kg (279 lb 9.6 oz)  Height: 157.5 cm (5' 2)   PainSc: 0-No pain  PainLoc: Abdomen    Body mass index is 51.14 kg/m.  Gen: A&Ox3, no distress  Unlabored respirations  Labs, Imaging and Diagnostic Testing: CT angio chest 04/13/2021-and again negative for PE, note small hiatal hernia  CMP, vitamin D , vitamin B12, hemoglobin A1c completed on 03/06/2022 all within normal limits  Assessment and Plan:  Diagnoses and all orders for this visit:  Gastroesophageal reflux disease, unspecified whether esophagitis present - X-ray upper GI  Morbid obesity (CMS/HHS-HCC)  Anemia, unspecified type  Vitamin D  deficiency  Pulmonary embolism, other, unspecified chronicity, unspecified whether acute cor pulmonale present (CMS/HHS-HCC)  Hyperthyroidism  Asthma, unspecified asthma severity, unspecified whether complicated, unspecified whether persistent (HHS-HCC)  PONV (postoperative nausea and vomiting)  Obstructive sleep apnea  Insulin  resistance  Nonintractable headache, unspecified chronicity pattern, unspecified headache type  Some recurrence of the globus sensation and concern for atypical reflux. I discussed with her that if reflux is symptomatic enough to require daily medication it is best to consider Roux-en-Y gastric bypass. At this time she is very reluctant to consider that operation and really does not feel that it is the best for her. While this intuition is of course meaningful and important, we discussed 10% likelihood  of worsening reflux symptoms after sleeve gastrectomy they can be difficult to manage and occasionally require conversion to Roux-en-Y gastric bypass, which she states she still would not be willing to consider. Interesting that her symptoms seem to have abated despite not really changing her medication regimen. Will plan to repeat her upper GI to evaluate the prior noted Schatzki ring and for any change in the anatomy. If there is still no reflux demonstrated on the upper GI, I think it would still be reasonable  to consider proceeding with sleeve gastrectomy given the patient's current stance and intractable obesity that certainly needs aggressive treatment. If there is overt reflux or any new anatomic finding, we would need to pause the pathway, get her back to GI to consider formal endoscopy, and then depending on results see if she is ready to consider gastric bypass.   She is interested in sleeve gastrectomy, and remains a good candidate for this. She has new globus sensation which she has after she eats something she is not supposed to eat from for example cake, and this does improve with reflux medications. She had no hiatal hernia on upper GI last year but CT angio did note a small hiatal hernia prior to this.  We reviewed the surgery including relevant anatomy, technical aspects, the risks of bleeding, infection, pain, scarring, injury to intra-abdominal structures, staple line/anastomotic leak or abscess, chronic abdominal pain or nausea, new onset or worsened GERD, constipation, malnutrition/nutrient deficiency, DVT/PE, pneumonia, heart attack, stroke, death, failure to reach weight loss goals and weight regain, hernia. Reviewed the typical pre-, peri-, and postoperative course. Discussed the importance of lifelong behavioral changes to combat the chronic and relapsing disease which is obesity. Questions welcomed and answered to her satisfaction. Will proceed with the bariatric pathway towards sleeve gastrectomy.  Luzmaria Devaux ALAN FREUND, MD

## 2023-08-13 NOTE — H&P (Signed)
 Debra Horn [id number]   Referring Provider: Self  Subjective   Chief Complaint: Pre-op Exam (LSG and upper endo/)   History of Present Illness:  Follow-up regarding surgical treatment of morbid obesity. She did have a recurrence of her globus sensation after consuming some soda and birthday cake at her birthday party and has subsequently followed up with GI. Concern for laryngopharyngeal reflux is an ongoing issue after Wegovy . She reports that her symptoms are very minimal, she describes a knot in her throat which is a very mild sensation, no postprandial exacerbation or typical reflux symptoms. She has been prescribed twice daily Protonix  but has not started this yet. Otherwise has completed the bariatric pathway and is currently scheduled for surgery on 8/19.  5/8: Returns to pursue surgery for treatment of morbid obesity. She did have her upper GI last year which was normal, no reflux or hiatal hernia. Chest x-ray also normal at that time. Last visit with [facility] was last week, this was visit #30 and her weight was unchanged at 269 pounds. Had weaned off of soda and juice, occasional Sprite with a max intake of 2- 20oz servings per day. She had lab work done in the end of January this year including an unremarkable CMP; vitamin B12 is normal, D was low at 22.6 and this is being managed at [facility], hemoglobin A1c 5.7, insulin  elevated at 28.3, TSH/T4 normal, CBC in December 2024 unremarkable; hemoglobin 12.8. She remains committed to aggressive treatment of morbid obesity and remains interested in sleeve gastrectomy. She did try Wegovy  briefly over the course of the year but it did not work for her and she developed a globus sensation which does improve antireflux medications. No overt heartburn or reflux symptoms otherwise.  April 1370: 38 year old woman who presents for consultation regarding surgical management of morbid obesity. She has followed with [facility] in September 2022, and while she reports that she has dramatically improved her relationship with food, meal planning and prepping, she has only lost 11 pounds in that interval. Her insurance will not cover any form of GLP-1 therapy. One of her biggest concerns surrounding her weight is that she has severe sleep apnea. She has had some frightening events surrounding this. She also has asthma and chronic issues with shortness of breath. She has been told by many of her physicians that much of this will improve with weight loss. She has made an earnest effort to do this but with minimal and transient success. She reports that she really does not eat much. She does not exercise. She works from home, in [employer] for [employer]. She does have a special needs son. Previous abdominal surgery includes C-section x 3, tubal ligation 2019 with last C-section  Cardiac cath in 2022 was negative  Family history notable for lupus and rheumatoid arthritis in her mother, hypertension and diabetes as well as cancer in her father, as well as hypertension, stroke, diabetes other family members. She reports family history of obesity on her mother side but not much on the father side.  No nicotine/alcohol/drug use  Review of Systems: A complete review of systems was obtained from the patient. I have reviewed this information and discussed as appropriate with the patient. See HPI as well for other ROS.  Medical History: Past Medical History:  Diagnosis Date  Anxiety  Asthma, unspecified asthma severity, unspecified whether complicated, unspecified whether persistent (HHS-HCC)  DVT (deep venous thrombosis) (CMS/HHS-HCC)  Sleep apnea  There is no problem list on file for this patient.  Past Surgical History:  Procedure Laterality Date  ARTHROSCOPY ANKLE FOR REPAIR FRACTURE  CESAREAN SECTION    No Known Allergies  Current Outpatient Medications on File Prior to Visit  Medication Sig  Dispense Refill  albuterol  MDI, PROVENTIL , VENTOLIN , PROAIR , HFA 90 mcg/actuation inhaler TAKE 2 PUFFS BY MOUTH EVERY 6 HOURS AS NEEDED FOR WHEEZE OR SHORTNESS OF BREATH  albuterol  MDI, PROVENTIL , VENTOLIN , PROAIR , HFA 90 mcg/actuation inhaler Inhale 2 inhalations into the lungs every 6 (six) hours as needed for Wheezing 1 each 2  BREZTRI  AEROSPHERE 160-9-4.8 mcg/actuation inhaler Inhale 2 inhalations into the lungs  diphenhydrAMINE  (BENADRYL ) 25 mg capsule Take 25 mg by mouth every 6 (six) hours as needed for Itching  EPINEPHrine  (EPIPEN ) 0.3 mg/0.3 mL auto-injector Inject 0.3 mg into the muscle as needed for Anaphylaxis  FLUoxetine  (PROZAC ) 10 MG capsule Take by mouth  fluticasone  propionate (FLOVENT  HFA) 110 mcg/actuation inhaler Inhale into the lungs  levocetirizine (XYZAL ) 5 MG tablet Take 5 mg by mouth every evening  pantoprazole  (PROTONIX ) 40 MG DR tablet Take 40 mg by mouth 2 (two) times daily  predniSONE  (DELTASONE ) 20 MG tablet Take 40 mg by mouth  RYALTRIS  665-25 mcg/spray Spry Place 2 sprays into one nostril  ergocalciferol , vitamin D2, 1,250 mcg (50,000 unit) capsule TAKE 1 CAPSULE BY MOUTH ONCE PER WEEK  metFORMIN  (GLUCOPHAGE -XR) 500 MG XR tablet TAKE 1 TABLET EVERY DAY WITH FOOD   No current facility-administered medications on file prior to visit.   Family History  Problem Relation Age of Onset  Diabetes Father  High blood pressure (Hypertension) Father    Social History   Tobacco Use  Smoking Status Never  Smokeless Tobacco Never    Social History   Socioeconomic History  Marital status: Married  Tobacco Use  Smoking status: Never  Smokeless tobacco: Never  Vaping Use  Vaping status: Never Used  Substance and Sexual Activity  Alcohol use: Not Currently  Drug use: Never   Objective:   Vitals:  08/08/23 1607 08/08/23 1608  BP: (!) 145/87  Pulse: 85  Temp: 36.7 C (98 F)  SpO2: 99%  Weight: (!) 126.8 kg (279 lb 9.6 oz)  Height: 157.5 cm (5' 2)   PainSc: 0-No pain  PainLoc: Abdomen    Body mass index is 51.14 kg/m.  Gen: A&Ox3, no distress  Unlabored respirations  Labs, Imaging and Diagnostic Testing: CT angio chest 04/13/2021-and again negative for PE, note small hiatal hernia  CMP, vitamin D , vitamin B12, hemoglobin A1c completed on 03/06/2022 all within normal limits  Assessment and Plan:  Diagnoses and all orders for this visit:  Gastroesophageal reflux disease, unspecified whether esophagitis present - X-ray upper GI  Morbid obesity (CMS/HHS-HCC)  Anemia, unspecified type  Vitamin D  deficiency  Pulmonary embolism, other, unspecified chronicity, unspecified whether acute cor pulmonale present (CMS/HHS-HCC)  Hyperthyroidism  Asthma, unspecified asthma severity, unspecified whether complicated, unspecified whether persistent (HHS-HCC)  PONV (postoperative nausea and vomiting)  Obstructive sleep apnea  Insulin  resistance  Nonintractable headache, unspecified chronicity pattern, unspecified headache type  Some recurrence of the globus sensation and concern for atypical reflux. I discussed with her that if reflux is symptomatic enough to require daily medication it is best to consider Roux-en-Y gastric bypass. At this time she is very reluctant to consider that operation and really does not feel that it is the best for her. While this intuition is of course meaningful and important, we discussed 10% likelihood  of worsening reflux symptoms after sleeve gastrectomy they can be difficult to manage and occasionally require conversion to Roux-en-Y gastric bypass, which she states she still would not be willing to consider. Interesting that her symptoms seem to have abated despite not really changing her medication regimen. Will plan to repeat her upper GI to evaluate the prior noted Schatzki ring and for any change in the anatomy. If there is still no reflux demonstrated on the upper GI, I think it would still be reasonable  to consider proceeding with sleeve gastrectomy given the patient's current stance and intractable obesity that certainly needs aggressive treatment. If there is overt reflux or any new anatomic finding, we would need to pause the pathway, get her back to GI to consider formal endoscopy, and then depending on results see if she is ready to consider gastric bypass.   She is interested in sleeve gastrectomy, and remains a good candidate for this. She has new globus sensation which she has after she eats something she is not supposed to eat from for example cake, and this does improve with reflux medications. She had no hiatal hernia on upper GI last year but CT angio did note a small hiatal hernia prior to this.  We reviewed the surgery including relevant anatomy, technical aspects, the risks of bleeding, infection, pain, scarring, injury to intra-abdominal structures, staple line/anastomotic leak or abscess, chronic abdominal pain or nausea, new onset or worsened GERD, constipation, malnutrition/nutrient deficiency, DVT/PE, pneumonia, heart attack, stroke, death, failure to reach weight loss goals and weight regain, hernia. Reviewed the typical pre-, peri-, and postoperative course. Discussed the importance of lifelong behavioral changes to combat the chronic and relapsing disease which is obesity. Questions welcomed and answered to her satisfaction. Will proceed with the bariatric pathway towards sleeve gastrectomy.  Debra Horn ALAN FREUND, MD

## 2023-08-13 NOTE — Patient Instructions (Signed)
 SURGICAL WAITING ROOM VISITATION  Patients having surgery or a procedure may have no more than 2 support people in the waiting area - these visitors may rotate.    Children under the age of 18 must have an adult with them who is not the patient.  Visitors with respiratory illnesses are discouraged from visiting and should remain at home.  If the patient needs to stay at the hospital during part of their recovery, the visitor guidelines for inpatient rooms apply. Pre-op nurse will coordinate an appropriate time for 1 support person to accompany patient in pre-op.  This support person may not rotate.    Please refer to the [facility] website for the visitor guidelines for Inpatients (after your surgery is over and you are in a regular room).       Your procedure is scheduled on: 08/21/23   Report to [facility] Main Entrance    Report to admitting at 5:15 AM   Call this number if you have problems the morning of surgery [phone]   Do not eat food :After Midnight.   After Midnight you may have the following liquids until 4:30 AM DAY OF SURGERY  Water Non-Citrus Juices (without pulp, NO RED-Apple, White grape, White cranberry) Black Coffee (NO MILK/CREAM OR CREAMERS, sugar ok)  Clear Tea (NO MILK/CREAM OR CREAMERS, sugar ok) regular and decaf                             Plain Jell-O (NO RED)                                           Fruit ices (not with fruit pulp, NO RED)                                     Popsicles (NO RED)                                                               Sports drinks like Gatorade (NO RED)                  The day of surgery:  Drink ONE (1) Pre-Surgery G2 at 4:30 AM the morning of surgery. Drink in one sitting. Do not sip.  This drink was given to you during your hospital  pre-op appointment visit. Nothing else to drink after completing the  Pre-Surgery G2.          If you have questions, please contact your surgeon's  office.   FOLLOW BOWEL PREP AND ANY ADDITIONAL PRE OP INSTRUCTIONS YOU RECEIVED FROM YOUR SURGEON'S OFFICE!!!     Oral Hygiene is also important to reduce your risk of infection.                                    Remember - BRUSH YOUR TEETH THE MORNING OF SURGERY WITH YOUR REGULAR TOOTHPASTE   Stop all vitamins and herbal supplements 7 days before surgery.  Take these medicines the morning of surgery with A SIP OF WATER: doxycycline (vibramycin ), Famotidine (pepcid ), Levocetirizine(xyzal ), Pantoprazole (protonix ), Inhaler  Bring CPAP mask and tubing day of surgery.                              You may not have any metal on your body including hair pins, jewelry, and body piercing             Do not wear make-up, lotions, powders, perfumes/cologne, or deodorant  Do not wear nail polish including gel and S&S, artificial/acrylic nails, or any other type of covering on natural nails including finger and toenails. If you have artificial nails, gel coating, etc. that needs to be removed by a nail salon please have this removed prior to surgery or surgery may need to be canceled/ delayed if the surgeon/ anesthesia feels like they are unable to be safely monitored.   Do not shave  48 hours prior to surgery.    Do not bring valuables to the hospital. [facility] IS NOT             RESPONSIBLE   FOR VALUABLES.   Contacts, glasses, dentures or bridgework may not be worn into surgery.   Bring small overnight bag day of surgery.   DO NOT BRING YOUR HOME MEDICATIONS TO THE HOSPITAL. PHARMACY WILL DISPENSE MEDICATIONS LISTED ON YOUR MEDICATION LIST TO YOU DURING YOUR ADMISSION IN THE HOSPITAL!    Patients discharged on the day of surgery will not be allowed to drive home.  Someone NEEDS to stay with you for the first 24 hours after anesthesia.   Special Instructions: Bring a copy of your healthcare power of attorney and living will documents the day of surgery if you haven't scanned them  before.              Please read over the following fact sheets you were given: IF YOU HAVE QUESTIONS ABOUT YOUR PRE-OP INSTRUCTIONS PLEASE CALL [phone] Debra Horn   If you received a COVID test during your pre-op visit  it is requested that you wear a mask when out in public, stay away from anyone that may not be feeling well and notify your surgeon if you develop symptoms. If you test positive for Covid or have been in contact with anyone that has tested positive in the last 10 days please notify you surgeon.    [facility] - Preparing for Surgery Before surgery, you can play an important role.  Because skin is not sterile, your skin needs to be as free of germs as possible.  You can reduce the number of germs on your skin by washing with CHG (chlorahexidine gluconate) soap before surgery.  CHG is an antiseptic cleaner which kills germs and bonds with the skin to continue killing germs even after washing. Please DO NOT use if you have an allergy  to CHG or antibacterial soaps.  If your skin becomes reddened/irritated stop using the CHG and inform your nurse when you arrive at Short Stay. Do not shave (including legs and underarms) for at least 48 hours prior to the first CHG shower.  You may shave your face/neck.  Please follow these instructions carefully:  1.  Shower with CHG Soap the night before surgery and the  morning of surgery.  2.  If you choose to wash your hair, wash your hair first as usual with your normal  shampoo.  3.  After you shampoo,  rinse your hair and body thoroughly to remove the shampoo.                             4.  Use CHG as you would any other liquid soap.  You can apply chg directly to the skin and wash.  Gently with a scrungie or clean washcloth.  5.  Apply the CHG Soap to your body ONLY FROM THE NECK DOWN.   Do   not use on face/ open                           Wound or open sores. Avoid contact with eyes, ears mouth and   genitals (private parts).                        Wash face,  Genitals (private parts) with your normal soap.             6.  Wash thoroughly, paying special attention to the area where your    surgery  will be performed.  7.  Thoroughly rinse your body with warm water from the neck down.  8.  DO NOT shower/wash with your normal soap after using and rinsing off the CHG Soap.                9.  Pat yourself dry with a clean towel.            10.  Wear clean pajamas.            11.  Place clean sheets on your bed the night of your first shower and do not  sleep with pets. Day of Surgery : Do not apply any lotions/deodorants the morning of surgery.  Please wear clean clothes to the hospital/surgery center.  FAILURE TO FOLLOW THESE INSTRUCTIONS MAY RESULT IN THE CANCELLATION OF YOUR SURGERY                                                                                               Incentive Spirometer  An incentive spirometer is a tool that can help keep your lungs clear and active. This tool measures how well you are filling your lungs with each breath. Taking long deep breaths may help reverse or decrease the chance of developing breathing (pulmonary) problems (especially infection) following: A long period of time when you are unable to move or be active. BEFORE THE PROCEDURE  If the spirometer includes an indicator to show your best effort, your nurse or respiratory therapist will set it to a desired goal. If possible, sit up straight or lean slightly forward. Try not to slouch. Hold the incentive spirometer in an upright position. INSTRUCTIONS FOR USE  Sit on the edge of your bed if possible, or sit up as far as you can in bed or on a chair. Hold the incentive spirometer in an upright position. Breathe out normally. Place the mouthpiece in your mouth and seal your lips tightly around it. Breathe in slowly  and as deeply as possible, raising the piston or the ball toward the top of the column. Hold your breath for 3-5 seconds or for as  long as possible. Allow the piston or ball to fall to the bottom of the column. Remove the mouthpiece from your mouth and breathe out normally. Rest for a few seconds and repeat Steps 1 through 7 at least 10 times every 1-2 hours when you are awake. Take your time and take a few normal breaths between deep breaths. The spirometer may include an indicator to show your best effort. Use the indicator as a goal to work toward during each repetition. After each set of 10 deep breaths, practice coughing to be sure your lungs are clear. If you have an incision (the cut made at the time of surgery), support your incision when coughing by placing a pillow or rolled up towels firmly against it. Once you are able to get out of bed, walk around indoors and cough well. You may stop using the incentive spirometer when instructed by your caregiver.  RISKS AND COMPLICATIONS Take your time so you do not get dizzy or light-headed. If you are in pain, you may need to take or ask for pain medication before doing incentive spirometry. It is harder to take a deep breath if you are having pain. AFTER USE Rest and breathe slowly and easily. It can be helpful to keep track of a log of your progress. Your caregiver can provide you with a simple table to help with this. If you are using the spirometer at home, follow these instructions: SEEK MEDICAL CARE IF:  You are having difficultly using the spirometer. You have trouble using the spirometer as often as instructed. Your pain medication is not giving enough relief while using the spirometer. You develop fever of 100.5 F (38.1 C) or higher. SEEK IMMEDIATE MEDICAL CARE IF:  You cough up bloody sputum that had not been present before. You develop fever of 102 F (38.9 C) or greater. You develop worsening pain at or near the incision site. MAKE SURE YOU:  Understand these instructions. Will watch your condition. Will get help right away if you are not doing well or  get worse.   SABRAMORNING OF SURGERY DRINK:   DRINK 1 G2 drink BEFORE YOU LEAVE HOME, DRINK ALL OF THE  G2 DRINK AT ONE TIME.   NO SOLID FOOD AFTER 600 PM THE NIGHT BEFORE YOUR SURGERY. YOU MAY DRINK CLEAR FLUIDS. THE G2 DRINK YOU DRINK BEFORE YOU LEAVE HOME WILL BE THE LAST FLUIDS YOU DRINK BEFORE SURGERY.  PAIN IS EXPECTED AFTER SURGERY AND WILL NOT BE COMPLETELY ELIMINATED. AMBULATION AND TYLENOL  WILL HELP REDUCE INCISIONAL AND GAS PAIN. MOVEMENT IS KEY!  YOU ARE EXPECTED TO BE OUT OF BED WITHIN 4 HOURS OF ADMISSION TO YOUR PATIENT ROOM.  SITTING IN THE RECLINER THROUGHOUT THE DAY IS IMPORTANT FOR DRINKING FLUIDS AND MOVING GAS THROUGHOUT THE GI TRACT.  COMPRESSION STOCKINGS SHOULD BE WORN [facility] STAY UNLESS YOU ARE WALKING.   INCENTIVE SPIROMETER SHOULD BE USED EVERY HOUR WHILE AWAKE TO DECREASE POST-OPERATIVE COMPLICATIONS SUCH AS PNEUMONIA.  WHEN DISCHARGED HOME, IT IS IMPORTANT TO CONTINUE TO WALK EVERY HOUR AND USE THE INCENTIVE SPIROMETER EVERY HOUR.

## 2023-08-14 ENCOUNTER — Encounter (HOSPITAL_COMMUNITY): Payer: Self-pay

## 2023-08-14 ENCOUNTER — Other Ambulatory Visit: Payer: Self-pay

## 2023-08-14 ENCOUNTER — Encounter (HOSPITAL_COMMUNITY)
Admission: RE | Admit: 2023-08-14 | Discharge: 2023-08-14 | Disposition: A | Discharge status: Home / Self Care | Source: Ambulatory Visit | Attending: Surgery | Admitting: Surgery

## 2023-08-14 VITALS — BP 136/103 | HR 74 | Temp 98.4°F | Resp 16 | Ht 62.0 in | Wt 271.0 lb

## 2023-08-14 DIAGNOSIS — Z01818 Encounter for other preprocedural examination: Secondary | ICD-10-CM | POA: Diagnosis present

## 2023-08-14 DIAGNOSIS — Z6841 Body Mass Index (BMI) 40.0 and over, adult: Secondary | ICD-10-CM | POA: Diagnosis not present

## 2023-08-14 DIAGNOSIS — Z01812 Encounter for preprocedural laboratory examination: Secondary | ICD-10-CM | POA: Insufficient documentation

## 2023-08-14 LAB — COMPREHENSIVE METABOLIC PANEL WITH GFR
ALT: 15 U/L (ref 0–44)
AST: 13 U/L — ABNORMAL LOW (ref 15–41)
Albumin: 3.8 g/dL (ref 3.5–5.0)
Alkaline Phosphatase: 75 U/L (ref 38–126)
Anion gap: 9 (ref 5–15)
BUN: 14 mg/dL (ref 6–20)
CO2: 25 mmol/L (ref 22–32)
Calcium: 9.5 mg/dL (ref 8.9–10.3)
Chloride: 102 mmol/L (ref 98–111)
Creatinine, Ser: 0.78 mg/dL (ref 0.44–1.00)
GFR, Estimated: >60 mL/min (ref >60)
Glucose, Bld: 86 mg/dL (ref 70–99)
Potassium: 3.5 mmol/L (ref 3.5–5.1)
Sodium: 136 mmol/L (ref 135–145)
Total Bilirubin: 0.8 mg/dL (ref 0.0–1.2)
Total Protein: 8.3 g/dL — ABNORMAL HIGH (ref 6.5–8.1)

## 2023-08-14 LAB — CBC WITH DIFFERENTIAL/PLATELET
Abs Immature Granulocytes: 0.01 K/uL (ref 0.00–0.07)
Basophils Absolute: 0 K/uL (ref 0.0–0.1)
Basophils Relative: 0 %
Eosinophils Absolute: 0.1 K/uL (ref 0.0–0.5)
Eosinophils Relative: 2 %
HCT: 40.1 % (ref 36.0–46.0)
Hemoglobin: 13 g/dL (ref 12.0–15.0)
Immature Granulocytes: 0 %
Lymphocytes Relative: 38 %
Lymphs Abs: 2.4 K/uL (ref 0.7–4.0)
MCH: 30.7 pg (ref 26.0–34.0)
MCHC: 32.4 g/dL (ref 30.0–36.0)
MCV: 94.6 fL (ref 80.0–100.0)
Monocytes Absolute: 0.7 K/uL (ref 0.1–1.0)
Monocytes Relative: 11 %
Neutro Abs: 3.1 K/uL (ref 1.7–7.7)
Neutrophils Relative %: 49 %
Platelets: 449 K/uL — ABNORMAL HIGH (ref 150–400)
RBC: 4.24 MIL/uL (ref 3.87–5.11)
RDW: 15.1 % (ref 11.5–15.5)
WBC: 6.3 K/uL (ref 4.0–10.5)
nRBC: 0 % (ref 0.0–0.2)

## 2023-08-15 ENCOUNTER — Ambulatory Visit (HOSPITAL_COMMUNITY)
Admission: RE | Admit: 2023-08-15 | Discharge: 2023-08-15 | Disposition: A | Discharge status: Home / Self Care | Source: Ambulatory Visit | Attending: Surgery | Admitting: Surgery

## 2023-08-15 DIAGNOSIS — K449 Diaphragmatic hernia without obstruction or gangrene: Secondary | ICD-10-CM | POA: Diagnosis not present

## 2023-08-15 DIAGNOSIS — K219 Gastro-esophageal reflux disease without esophagitis: Secondary | ICD-10-CM | POA: Diagnosis not present

## 2023-08-15 DIAGNOSIS — R130 Aphagia: Secondary | ICD-10-CM | POA: Diagnosis not present

## 2023-08-16 ENCOUNTER — Ambulatory Visit: Admitting: Family Medicine

## 2023-08-19 ENCOUNTER — Other Ambulatory Visit: Payer: Self-pay | Admitting: Emergency Medicine

## 2023-08-20 NOTE — Anesthesia Preprocedure Evaluation (Signed)
 Anesthesia Evaluation  Patient identified by MRN, date of birth, ID band Patient awake    Reviewed: Allergy  & Precautions, H&P , NPO status , Patient's Chart, lab work & pertinent test results  History of Anesthesia Complications (+) PONV and history of anesthetic complications  Airway Mallampati: III  TM Distance: >3 FB Neck ROM: Full    Dental no notable dental hx. (+) Teeth Intact, Dental Advisory Given   Pulmonary asthma , sleep apnea and Continuous Positive Airway Pressure Ventilation    Pulmonary exam normal breath sounds clear to auscultation       Cardiovascular Exercise Tolerance: Good hypertension,  Rhythm:Regular Rate:Normal     Neuro/Psych  Headaches  negative psych ROS   GI/Hepatic negative GI ROS, Neg liver ROS,,,  Endo/Other    Class 4 obesity  Renal/GU negative Renal ROS  negative genitourinary   Musculoskeletal   Abdominal   Peds  Hematology  (+) Blood dyscrasia, anemia   Anesthesia Other Findings   Reproductive/Obstetrics negative OB ROS                              Anesthesia Physical Anesthesia Plan  ASA: 4  Anesthesia Plan: General   Post-op Pain Management: Toradol  IV (intra-op)* and Tylenol  PO (pre-op)*   Induction: Intravenous  PONV Risk Score and Plan: 4 or greater and Ondansetron , Dexamethasone , Midazolam  and Scopolamine  patch - Pre-op  Airway Management Planned: Oral ETT and Video Laryngoscope Planned  Additional Equipment:   Intra-op Plan:   Post-operative Plan: Extubation in OR  Informed Consent: I have reviewed the patients History and Physical, chart, labs and discussed the procedure including the risks, benefits and alternatives for the proposed anesthesia with the patient or authorized representative who has indicated his/her understanding and acceptance.     Dental advisory given  Plan Discussed with: CRNA  Anesthesia Plan Comments:           Anesthesia Quick Evaluation

## 2023-08-21 ENCOUNTER — Encounter (HOSPITAL_COMMUNITY)
Admission: RE | Disposition: A | Discharge status: Home / Self Care | Payer: Self-pay | Source: Ambulatory Visit | Attending: Surgery

## 2023-08-21 ENCOUNTER — Encounter (HOSPITAL_COMMUNITY): Payer: Self-pay | Admitting: Surgery

## 2023-08-21 ENCOUNTER — Encounter (HOSPITAL_COMMUNITY): Payer: Self-pay | Admitting: Medical

## 2023-08-21 ENCOUNTER — Inpatient Hospital Stay (HOSPITAL_COMMUNITY): Payer: Self-pay | Admitting: Anesthesiology

## 2023-08-21 ENCOUNTER — Inpatient Hospital Stay (HOSPITAL_COMMUNITY)
Admission: RE | Admit: 2023-08-21 | Discharge: 2023-08-23 | DRG: 621 | Disposition: A | Discharge status: Home / Self Care | Source: Ambulatory Visit | Attending: Surgery | Admitting: Surgery

## 2023-08-21 ENCOUNTER — Other Ambulatory Visit: Payer: Self-pay

## 2023-08-21 ENCOUNTER — Other Ambulatory Visit (HOSPITAL_COMMUNITY): Payer: Self-pay

## 2023-08-21 ENCOUNTER — Telehealth (HOSPITAL_COMMUNITY): Payer: Self-pay | Admitting: Pharmacy Technician

## 2023-08-21 DIAGNOSIS — I10 Essential (primary) hypertension: Secondary | ICD-10-CM

## 2023-08-21 DIAGNOSIS — J45909 Unspecified asthma, uncomplicated: Secondary | ICD-10-CM

## 2023-08-21 DIAGNOSIS — Z9851 Tubal ligation status: Secondary | ICD-10-CM

## 2023-08-21 DIAGNOSIS — E0590 Thyrotoxicosis, unspecified without thyrotoxic crisis or storm: Secondary | ICD-10-CM | POA: Diagnosis present

## 2023-08-21 DIAGNOSIS — Z833 Family history of diabetes mellitus: Secondary | ICD-10-CM | POA: Diagnosis not present

## 2023-08-21 DIAGNOSIS — G4733 Obstructive sleep apnea (adult) (pediatric): Secondary | ICD-10-CM | POA: Diagnosis present

## 2023-08-21 DIAGNOSIS — Z823 Family history of stroke: Secondary | ICD-10-CM | POA: Diagnosis not present

## 2023-08-21 DIAGNOSIS — D175 Benign lipomatous neoplasm of intra-abdominal organs: Secondary | ICD-10-CM | POA: Diagnosis present

## 2023-08-21 DIAGNOSIS — K449 Diaphragmatic hernia without obstruction or gangrene: Secondary | ICD-10-CM | POA: Diagnosis present

## 2023-08-21 DIAGNOSIS — Z86718 Personal history of other venous thrombosis and embolism: Secondary | ICD-10-CM

## 2023-08-21 DIAGNOSIS — Z881 Allergy status to other antibiotic agents status: Secondary | ICD-10-CM | POA: Diagnosis not present

## 2023-08-21 DIAGNOSIS — Z809 Family history of malignant neoplasm, unspecified: Secondary | ICD-10-CM | POA: Diagnosis not present

## 2023-08-21 DIAGNOSIS — Z86711 Personal history of pulmonary embolism: Secondary | ICD-10-CM

## 2023-08-21 DIAGNOSIS — Z7951 Long term (current) use of inhaled steroids: Secondary | ICD-10-CM

## 2023-08-21 DIAGNOSIS — Z8249 Family history of ischemic heart disease and other diseases of the circulatory system: Secondary | ICD-10-CM

## 2023-08-21 DIAGNOSIS — E88819 Insulin resistance, unspecified: Secondary | ICD-10-CM | POA: Diagnosis present

## 2023-08-21 DIAGNOSIS — Z01818 Encounter for other preprocedural examination: Secondary | ICD-10-CM

## 2023-08-21 DIAGNOSIS — Z91018 Allergy to other foods: Secondary | ICD-10-CM | POA: Diagnosis not present

## 2023-08-21 DIAGNOSIS — Z79899 Other long term (current) drug therapy: Secondary | ICD-10-CM | POA: Diagnosis not present

## 2023-08-21 DIAGNOSIS — Z6841 Body Mass Index (BMI) 40.0 and over, adult: Secondary | ICD-10-CM

## 2023-08-21 DIAGNOSIS — Z98891 History of uterine scar from previous surgery: Secondary | ICD-10-CM

## 2023-08-21 DIAGNOSIS — K219 Gastro-esophageal reflux disease without esophagitis: Secondary | ICD-10-CM | POA: Diagnosis present

## 2023-08-21 DIAGNOSIS — Z7984 Long term (current) use of oral hypoglycemic drugs: Secondary | ICD-10-CM

## 2023-08-21 DIAGNOSIS — Z8261 Family history of arthritis: Secondary | ICD-10-CM | POA: Diagnosis not present

## 2023-08-21 HISTORY — PX: LAPAROSCOPIC GASTRIC SLEEVE RESECTION: SHX5895

## 2023-08-21 HISTORY — PX: UPPER GI ENDOSCOPY: SHX6162

## 2023-08-21 LAB — POCT PREGNANCY, URINE: Preg Test, Ur: NEGATIVE

## 2023-08-21 LAB — TYPE AND SCREEN
ABO/RH(D): B POS
Antibody Screen: POSITIVE
Unit division: 0
Unit division: 0

## 2023-08-21 LAB — BPAM RBC
Blood Product Expiration Date: 202509092359
Blood Product Expiration Date: 202509092359
Unit Type and Rh: 7300
Unit Type and Rh: 7300

## 2023-08-21 SURGERY — GASTRECTOMY, SLEEVE, LAPAROSCOPIC
Anesthesia: General

## 2023-08-21 MED ORDER — HEPARIN SODIUM (PORCINE) 5000 UNIT/ML IJ SOLN
5000.0000 [IU] | Freq: Three times a day (TID) | INTRAMUSCULAR | Status: DC
  Administered 2023-08-21 – 2023-08-23 (×5): 5000 [IU] via SUBCUTANEOUS
  Filled 2023-08-21 (×5): qty 1

## 2023-08-21 MED ORDER — ROCURONIUM BROMIDE 10 MG/ML (PF) SYRINGE
PREFILLED_SYRINGE | INTRAVENOUS | Status: DC | PRN
  Administered 2023-08-21: 70 mg via INTRAVENOUS

## 2023-08-21 MED ORDER — SCOPOLAMINE 1 MG/3DAYS TD PT72
1.0000 | MEDICATED_PATCH | TRANSDERMAL | Status: DC
  Administered 2023-08-21: 1.5 mg via TRANSDERMAL
  Filled 2023-08-21: qty 1

## 2023-08-21 MED ORDER — BUPIVACAINE-EPINEPHRINE (PF) 0.25% -1:200000 IJ SOLN
INTRAMUSCULAR | Status: AC
  Filled 2023-08-21: qty 60

## 2023-08-21 MED ORDER — CHLORHEXIDINE GLUCONATE 4 % EX SOLN: 60.0000 mL | Freq: Once | CUTANEOUS | Status: DC

## 2023-08-21 MED ORDER — ONDANSETRON HCL 4 MG/2ML IJ SOLN: 4.0000 mg | INTRAMUSCULAR | Status: DC | PRN

## 2023-08-21 MED ORDER — MIDAZOLAM HCL 2 MG/2ML IJ SOLN
INTRAMUSCULAR | Status: AC
  Filled 2023-08-21: qty 2

## 2023-08-21 MED ORDER — METOCLOPRAMIDE HCL 5 MG/ML IJ SOLN
10.0000 mg | Freq: Four times a day (QID) | INTRAMUSCULAR | Status: DC
  Administered 2023-08-21 – 2023-08-23 (×7): 10 mg via INTRAVENOUS
  Filled 2023-08-21 (×7): qty 2

## 2023-08-21 MED ORDER — GLYCOPYRROLATE 0.2 MG/ML IJ SOLN
INTRAMUSCULAR | Status: DC | PRN
  Administered 2023-08-21: .2 mg via INTRAVENOUS

## 2023-08-21 MED ORDER — SUGAMMADEX SODIUM 200 MG/2ML IV SOLN
INTRAVENOUS | Status: AC
  Filled 2023-08-21: qty 4

## 2023-08-21 MED ORDER — ONDANSETRON HCL 4 MG/2ML IJ SOLN
INTRAMUSCULAR | Status: AC
  Filled 2023-08-21: qty 2

## 2023-08-21 MED ORDER — ORAL CARE MOUTH RINSE: 15.0000 mL | Freq: Once | OROMUCOSAL | Status: AC

## 2023-08-21 MED ORDER — PROPOFOL 10 MG/ML IV BOLUS
INTRAVENOUS | Status: DC | PRN
Start: 2023-08-21 — End: 2023-08-21
  Administered 2023-08-21: 200 mg via INTRAVENOUS

## 2023-08-21 MED ORDER — BUPIVACAINE-EPINEPHRINE 0.25% -1:200000 IJ SOLN
INTRAMUSCULAR | Status: DC | PRN
Start: 2023-08-21 — End: 2023-08-21
  Administered 2023-08-21: 60 mL

## 2023-08-21 MED ORDER — ROCURONIUM BROMIDE 10 MG/ML (PF) SYRINGE
PREFILLED_SYRINGE | INTRAVENOUS | Status: AC
  Filled 2023-08-21: qty 10

## 2023-08-21 MED ORDER — SODIUM CHLORIDE 0.9 % IV SOLN
2.0000 g | INTRAVENOUS | Status: AC
  Administered 2023-08-21: 2 g via INTRAVENOUS
  Filled 2023-08-21: qty 2

## 2023-08-21 MED ORDER — ENSURE MAX PROTEIN PO LIQD
2.0000 [oz_av] | ORAL | Status: DC
  Administered 2023-08-22 – 2023-08-23 (×6): 2 [oz_av] via ORAL

## 2023-08-21 MED ORDER — DEXAMETHASONE SODIUM PHOSPHATE 10 MG/ML IJ SOLN
INTRAMUSCULAR | Status: DC | PRN
  Administered 2023-08-21: 8 mg via INTRAVENOUS

## 2023-08-21 MED ORDER — KETAMINE HCL 50 MG/5ML IJ SOSY
PREFILLED_SYRINGE | INTRAMUSCULAR | Status: DC | PRN
Start: 2023-08-21 — End: 2023-08-21
  Administered 2023-08-21: 50 mg via INTRAVENOUS

## 2023-08-21 MED ORDER — ACETAMINOPHEN 500 MG PO TABS
1000.0000 mg | ORAL_TABLET | ORAL | Status: AC
  Administered 2023-08-21: 1000 mg via ORAL
  Filled 2023-08-21: qty 2

## 2023-08-21 MED ORDER — HYDROMORPHONE HCL 1 MG/ML IJ SOLN
INTRAMUSCULAR | Status: AC
Start: 2023-08-21 — End: 2023-08-21
  Filled 2023-08-21: qty 2

## 2023-08-21 MED ORDER — TRAMADOL HCL 50 MG PO TABS
50.0000 mg | ORAL_TABLET | Freq: Four times a day (QID) | ORAL | Status: DC | PRN
  Filled 2023-08-21 (×2): qty 1

## 2023-08-21 MED ORDER — DEXAMETHASONE SODIUM PHOSPHATE 10 MG/ML IJ SOLN
INTRAMUSCULAR | Status: AC
  Filled 2023-08-21: qty 1

## 2023-08-21 MED ORDER — BUDESONIDE 0.25 MG/2ML IN SUSP
0.2500 mg | Freq: Two times a day (BID) | RESPIRATORY_TRACT | Status: DC
  Administered 2023-08-21 – 2023-08-22 (×3): 0.25 mg via RESPIRATORY_TRACT
  Filled 2023-08-21 (×3): qty 2

## 2023-08-21 MED ORDER — SIMETHICONE 80 MG PO CHEW: 80.0000 mg | CHEWABLE_TABLET | Freq: Four times a day (QID) | ORAL | Status: DC | PRN

## 2023-08-21 MED ORDER — LIDOCAINE HCL (PF) 2 % IJ SOLN
INTRAMUSCULAR | Status: DC | PRN
Start: 2023-08-21 — End: 2023-08-21
  Administered 2023-08-21: 60 mg via INTRADERMAL

## 2023-08-21 MED ORDER — ACETAMINOPHEN 160 MG/5ML PO SOLN
1000.0000 mg | Freq: Three times a day (TID) | ORAL | Status: DC
  Administered 2023-08-21 – 2023-08-23 (×5): 1000 mg via ORAL
  Filled 2023-08-21 (×5): qty 40.6

## 2023-08-21 MED ORDER — GABAPENTIN 100 MG PO CAPS
100.0000 mg | ORAL_CAPSULE | Freq: Two times a day (BID) | ORAL | Status: DC
  Administered 2023-08-21: 100 mg via ORAL
  Filled 2023-08-21 (×2): qty 1

## 2023-08-21 MED ORDER — ALBUTEROL SULFATE (2.5 MG/3ML) 0.083% IN NEBU
INHALATION_SOLUTION | RESPIRATORY_TRACT | Status: AC
  Filled 2023-08-21: qty 3

## 2023-08-21 MED ORDER — SUGAMMADEX SODIUM 200 MG/2ML IV SOLN
INTRAVENOUS | Status: AC
  Filled 2023-08-21: qty 2

## 2023-08-21 MED ORDER — HEPARIN SODIUM (PORCINE) 5000 UNIT/ML IJ SOLN
5000.0000 [IU] | INTRAMUSCULAR | Status: AC
  Administered 2023-08-21: 5000 [IU] via SUBCUTANEOUS
  Filled 2023-08-21: qty 1

## 2023-08-21 MED ORDER — PROPOFOL 10 MG/ML IV BOLUS
INTRAVENOUS | Status: AC
  Filled 2023-08-21: qty 20

## 2023-08-21 MED ORDER — DOCUSATE SODIUM 100 MG PO CAPS
100.0000 mg | ORAL_CAPSULE | Freq: Two times a day (BID) | ORAL | Status: DC
  Administered 2023-08-21 – 2023-08-23 (×4): 100 mg via ORAL
  Filled 2023-08-21 (×4): qty 1

## 2023-08-21 MED ORDER — ACETAMINOPHEN 10 MG/ML IV SOLN
INTRAVENOUS | Status: AC
  Filled 2023-08-21: qty 100

## 2023-08-21 MED ORDER — PANTOPRAZOLE SODIUM 40 MG IV SOLR
40.0000 mg | Freq: Every day | INTRAVENOUS | Status: DC
  Administered 2023-08-21 – 2023-08-22 (×2): 40 mg via INTRAVENOUS
  Filled 2023-08-21 (×2): qty 10

## 2023-08-21 MED ORDER — MIDAZOLAM HCL 2 MG/2ML IJ SOLN
INTRAMUSCULAR | Status: DC | PRN
  Administered 2023-08-21: 2 mg via INTRAVENOUS

## 2023-08-21 MED ORDER — HYDRALAZINE HCL 20 MG/ML IJ SOLN: 10.0000 mg | INTRAMUSCULAR | Status: DC | PRN

## 2023-08-21 MED ORDER — ACETAMINOPHEN 500 MG PO TABS
1000.0000 mg | ORAL_TABLET | Freq: Three times a day (TID) | ORAL | Status: DC
  Administered 2023-08-22: 1000 mg via ORAL
  Filled 2023-08-21 (×4): qty 2

## 2023-08-21 MED ORDER — HYDROMORPHONE HCL 1 MG/ML IJ SOLN
0.2500 mg | INTRAMUSCULAR | Status: DC | PRN
  Administered 2023-08-21: 0.5 mg via INTRAVENOUS

## 2023-08-21 MED ORDER — KETAMINE HCL 50 MG/5ML IJ SOSY
PREFILLED_SYRINGE | INTRAMUSCULAR | Status: AC
  Filled 2023-08-21: qty 5

## 2023-08-21 MED ORDER — SUGAMMADEX SODIUM 200 MG/2ML IV SOLN
INTRAVENOUS | Status: DC | PRN
  Administered 2023-08-21: 600 mg via INTRAVENOUS

## 2023-08-21 MED ORDER — APREPITANT 40 MG PO CAPS
40.0000 mg | ORAL_CAPSULE | ORAL | Status: AC
  Administered 2023-08-21: 40 mg via ORAL
  Filled 2023-08-21: qty 1

## 2023-08-21 MED ORDER — LACTATED RINGERS IR SOLN
Status: DC | PRN
  Administered 2023-08-21: 1000 mL

## 2023-08-21 MED ORDER — HYDROMORPHONE HCL 1 MG/ML IJ SOLN: 0.5000 mg | INTRAMUSCULAR | Status: DC | PRN

## 2023-08-21 MED ORDER — SODIUM CHLORIDE 0.9 % IV SOLN: INTRAVENOUS | Status: DC

## 2023-08-21 MED ORDER — 0.9 % SODIUM CHLORIDE (POUR BTL) OPTIME
TOPICAL | Status: DC | PRN
Start: 2023-08-21 — End: 2023-08-21
  Administered 2023-08-21: 1000 mL

## 2023-08-21 MED ORDER — LACTATED RINGERS IV SOLN: INTRAVENOUS | Status: DC

## 2023-08-21 MED ORDER — GABAPENTIN 100 MG PO CAPS
100.0000 mg | ORAL_CAPSULE | ORAL | Status: AC
  Administered 2023-08-21: 100 mg via ORAL
  Filled 2023-08-21: qty 1

## 2023-08-21 MED ORDER — METHOCARBAMOL 1000 MG/10ML IJ SOLN
500.0000 mg | Freq: Four times a day (QID) | INTRAMUSCULAR | Status: DC | PRN
  Filled 2023-08-21: qty 10

## 2023-08-21 MED ORDER — CHLORHEXIDINE GLUCONATE 0.12 % MT SOLN
15.0000 mL | Freq: Once | OROMUCOSAL | Status: AC
  Administered 2023-08-21: 15 mL via OROMUCOSAL

## 2023-08-21 MED ORDER — ALBUTEROL SULFATE (2.5 MG/3ML) 0.083% IN NEBU
2.5000 mg | INHALATION_SOLUTION | Freq: Four times a day (QID) | RESPIRATORY_TRACT | Status: DC | PRN
  Administered 2023-08-21: 2.5 mg via RESPIRATORY_TRACT
  Filled 2023-08-21: qty 3

## 2023-08-21 MED ORDER — OXYCODONE HCL 5 MG/5ML PO SOLN
5.0000 mg | Freq: Four times a day (QID) | ORAL | Status: DC | PRN
  Administered 2023-08-21 – 2023-08-22 (×2): 5 mg via ORAL
  Filled 2023-08-21 (×2): qty 5

## 2023-08-21 MED ORDER — ONDANSETRON HCL 4 MG/2ML IJ SOLN
INTRAMUSCULAR | Status: DC | PRN
  Administered 2023-08-21: 4 mg via INTRAVENOUS

## 2023-08-21 MED ORDER — FENTANYL CITRATE (PF) 250 MCG/5ML IJ SOLN
INTRAMUSCULAR | Status: DC | PRN
  Administered 2023-08-21: 25 ug via INTRAVENOUS
  Administered 2023-08-21: 50 ug via INTRAVENOUS
  Administered 2023-08-21: 100 ug via INTRAVENOUS
  Administered 2023-08-21: 25 ug via INTRAVENOUS

## 2023-08-21 MED ORDER — FENTANYL CITRATE (PF) 250 MCG/5ML IJ SOLN
INTRAMUSCULAR | Status: AC
  Filled 2023-08-21: qty 5

## 2023-08-21 MED ORDER — ALBUTEROL SULFATE (2.5 MG/3ML) 0.083% IN NEBU
2.5000 mg | INHALATION_SOLUTION | Freq: Four times a day (QID) | RESPIRATORY_TRACT | Status: DC | PRN
  Administered 2023-08-21: 2.5 mg via RESPIRATORY_TRACT

## 2023-08-21 MED ORDER — GLYCOPYRROLATE 0.2 MG/ML IJ SOLN
INTRAMUSCULAR | Status: AC
Start: 2023-08-21 — End: 2023-08-21
  Filled 2023-08-21: qty 1

## 2023-08-21 MED ORDER — METOPROLOL TARTRATE 5 MG/5ML IV SOLN: 5.0000 mg | Freq: Four times a day (QID) | INTRAVENOUS | Status: DC | PRN

## 2023-08-21 SURGICAL SUPPLY — 55 items
BAG COUNTER SPONGE SURGICOUNT (BAG) IMPLANT
BAG LAPAROSCOPIC 12 15 PORT 16 (BASKET) IMPLANT
BENZOIN TINCTURE PRP APPL 2/3 (GAUZE/BANDAGES/DRESSINGS) ×1 IMPLANT
BLADE SURG SZ11 CARB STEEL (BLADE) ×1 IMPLANT
BNDG ADH 1X3 SHEER STRL LF (GAUZE/BANDAGES/DRESSINGS) ×6 IMPLANT
CHLORAPREP W/TINT 26 (MISCELLANEOUS) ×2 IMPLANT
CLIP APPLIE 5 13 M/L LIGAMAX5 (MISCELLANEOUS) IMPLANT
CLIP APPLIE ROT 10 11.4 M/L (STAPLE) IMPLANT
CLIP APPLIE ROT 13.4 12 LRG (CLIP) IMPLANT
CLSR STERI-STRIP ANTIMIC 1/2X4 (GAUZE/BANDAGES/DRESSINGS) IMPLANT
COVER SURGICAL LIGHT HANDLE (MISCELLANEOUS) ×1 IMPLANT
DEVICE SUT QUICK LOAD TK 5 (SUTURE) IMPLANT
DEVICE SUT TI-KNOT TK 5X26 (SUTURE) IMPLANT
DEVICE SUTURE ENDOST 10MM (ENDOMECHANICALS) IMPLANT
DRAPE UTILITY XL STRL (DRAPES) ×2 IMPLANT
ELECT REM PT RETURN 15FT ADLT (MISCELLANEOUS) ×1 IMPLANT
GAUZE SPONGE 4X4 12PLY STRL (GAUZE/BANDAGES/DRESSINGS) IMPLANT
GLOVE BIO SURGEON STRL SZ 6 (GLOVE) ×1 IMPLANT
GLOVE INDICATOR 6.5 STRL GRN (GLOVE) ×1 IMPLANT
GOWN STRL REUS W/ TWL LRG LVL3 (GOWN DISPOSABLE) ×1 IMPLANT
GRASPER SUT TROCAR 14GX15 (MISCELLANEOUS) ×1 IMPLANT
IRRIGATION SUCT STRKRFLW 2 WTP (MISCELLANEOUS) ×1 IMPLANT
KIT BASIN OR (CUSTOM PROCEDURE TRAY) ×1 IMPLANT
KIT TURNOVER KIT A (KITS) ×1 IMPLANT
MARKER SKIN DUAL TIP RULER LAB (MISCELLANEOUS) ×1 IMPLANT
MAT PREVALON FULL STRYKER (MISCELLANEOUS) ×1 IMPLANT
NDL SPNL 22GX3.5 QUINCKE BK (NEEDLE) ×1 IMPLANT
NEEDLE SPNL 22GX3.5 QUINCKE BK (NEEDLE) ×1 IMPLANT
PACK UNIVERSAL I (CUSTOM PROCEDURE TRAY) ×1 IMPLANT
RELOAD STAPLE 60 3.6 BLU REG (STAPLE) ×1 IMPLANT
RELOAD STAPLE 60 3.8 GOLD REG (STAPLE) ×1 IMPLANT
RELOAD STAPLE 60 4.1 GRN THCK (STAPLE) IMPLANT
RELOAD SUT TRIPLE-STITCH 2-0 (ENDOMECHANICALS) IMPLANT
SCISSORS LAP 5X45 EPIX DISP (ENDOMECHANICALS) ×1 IMPLANT
SET TUBE SMOKE EVAC HIGH FLOW (TUBING) ×1 IMPLANT
SHEARS HARMONIC 45 ACE (MISCELLANEOUS) ×1 IMPLANT
SLEEVE ADV FIXATION 5X100MM (TROCAR) ×2 IMPLANT
SLEEVE GASTRECTOMY 40FR VISIGI (MISCELLANEOUS) ×1 IMPLANT
SOLUTION ANTFG W/FOAM PAD STRL (MISCELLANEOUS) ×1 IMPLANT
SPIKE FLUID TRANSFER (MISCELLANEOUS) ×1 IMPLANT
SPONGE T-LAP 18X18 ~~LOC~~+RFID (SPONGE) ×1 IMPLANT
STAPLE LINE REINFORCEMENT LAP (STAPLE) IMPLANT
STAPLER ECHELON LONG 3000 60 (ENDOMECHANICALS) IMPLANT
STAPLER ECHELON LONG 60 440 (INSTRUMENTS) ×1 IMPLANT
STRIP CLOSURE SKIN 1/2X4 (GAUZE/BANDAGES/DRESSINGS) ×1 IMPLANT
SUT MNCRL AB 4-0 PS2 18 (SUTURE) ×1 IMPLANT
SUT SURGIDAC NAB ES-9 0 48 120 (SUTURE) IMPLANT
SUT VICRYL 0 TIES 12 18 (SUTURE) ×1 IMPLANT
SYR 20ML LL LF (SYRINGE) ×1 IMPLANT
SYSTEM KII OPTICAL ACCESS 15MM (TROCAR) ×1 IMPLANT
TOWEL OR 17X26 10 PK STRL BLUE (TOWEL DISPOSABLE) ×1 IMPLANT
TROCAR ADV FIXATION 5X100MM (TROCAR) ×1 IMPLANT
TROCAR XCEL NON-BLD 5MMX100MML (ENDOMECHANICALS) ×1 IMPLANT
TUBING CONNECTING 10 (TUBING) ×1 IMPLANT
TUBING ENDO SMARTCAP (MISCELLANEOUS) ×1 IMPLANT

## 2023-08-21 NOTE — Discharge Instructions (Signed)

## 2023-08-21 NOTE — Anesthesia Procedure Notes (Addendum)
 Procedure Name: Intubation Date/Time: 08/21/2023 7:28 AM  Performed by: Augusta Daved SAILOR, CRNAPre-anesthesia Checklist: Patient identified, Emergency Drugs available, Suction available and Patient being monitored Patient Re-evaluated:Patient Re-evaluated prior to induction Oxygen Delivery Method: Circle System Utilized Preoxygenation: Pre-oxygenation with 100% oxygen Induction Type: IV induction Ventilation: Mask ventilation without difficulty Laryngoscope Size: Glidescope and 3 Grade View: Grade I Tube type: Oral Tube size: 7.0 mm Number of attempts: 1 Airway Equipment and Method: Stylet and Oral airway Placement Confirmation: ETT inserted through vocal cords under direct vision, positive ETCO2 and breath sounds checked- equal and bilateral Secured at: 21 (at the lip) cm Tube secured with: Tape Dental Injury: Teeth and Oropharynx as per pre-operative assessment

## 2023-08-21 NOTE — Plan of Care (Signed)

## 2023-08-21 NOTE — Progress Notes (Signed)
 Patient very sleepy following procedure.  Discussed QI Goals for Discharge document with patient and mother including ambulation in halls, Incentive Spirometry use every hour, and oral care.  Also discussed pain and nausea control.  Enabled or verified head of bed 30 degree alarm activated.  BSTOP education provided including BSTOP information guide, Guide for Pain Management after your Bariatric Procedure.  Diet progression education provided including Bariatric Surgery Post-Op Food Plan Phase 1: Liquids.  Questions answered.  Will continue to partner with bedside RN and follow up with patient per protocol.

## 2023-08-21 NOTE — Progress Notes (Signed)
   08/21/23 2156  BiPAP/CPAP/SIPAP  $ Non-Invasive Home Ventilator  Initial  BiPAP/CPAP/SIPAP Pt Type Adult  BiPAP/CPAP/SIPAP Resmed  Mask Type Nasal mask  Dentures removed? Not applicable  Mask Size Small  Respiratory Rate 14 breaths/min  FiO2 (%) 21 %  Patient Home Machine No  Patient Home Mask Yes  Patient Home Tubing Yes  Auto Titrate Yes  Minimum cmH2O 5 cmH2O  Maximum cmH2O 20 cmH2O  CPAP/SIPAP surface wiped down Yes  Device Plugged into RED Power Outlet Yes

## 2023-08-21 NOTE — Interval H&P Note (Signed)
 History and Physical Interval Note:  08/21/2023 6:54 AM  Raul A Schmutz  has presented today for surgery, with the diagnosis of MORBID OBESITY.  The various methods of treatment have been discussed with the patient and family. After consideration of risks, benefits and other options for treatment, the patient has consented to  Procedure(s): GASTRECTOMY, SLEEVE, LAPAROSCOPIC (N/A) ENDOSCOPY, UPPER GI TRACT (N/A) as a surgical intervention.  The patient's history has been reviewed, patient examined, no change in status, stable for surgery.  I have reviewed the patient's chart and labs.  Questions were answered to the patient's satisfaction.     Chancey Cullinane DELENA Freund

## 2023-08-21 NOTE — Op Note (Signed)
 Debra Horn [id number] 08-13-1985 08/21/2023  Preoperative diagnosis: severe obesity  Postoperative diagnosis: Same   Procedure: upper endoscopy   Surgeon: Camellia HERO. Donal Lynam M.D., FACS   Anesthesia: Gen.   Indications for procedure: 38 y.o. year old female undergoing Laparoscopic Gastric Sleeve Resection with sliding hiatal hernia repair and an EGD was requested to evaluate the new gastric sleeve.   Description of procedure: After we have completed the sleeve resection, I scrubbed out and obtained the Olympus endoscope. I gently placed endoscope in the patient's oropharynx and gently glided it down the esophagus without any difficulty under direct visualization. Once I was in the gastric sleeve, I insufflated the stomach with air. I was able to cannulate and advanced the scope through the gastric sleeve. I was able to cannulate the duodenum with ease. Dr. Signe had placed saline in the upper abdomen. Upon further insufflation of the gastric sleeve there was no evidence of bubbles. GE junction appeared normal.  Upon further inspection of the gastric sleeve, the mucosa appeared normal. There is no evidence of any mucosal abnormality. The sleeve was widely patent at the angularis. There was no evidence of bleeding. The gastric sleeve was decompressed. The scope was withdrawn. The patient tolerated this portion of the procedure well. Please see Dr Donneta operative note for details regarding the laparoscopic gastric sleeve resection.   Camellia HERO. Tanda, MD, FACS  General, Bariatric, & Minimally Invasive Surgery  [facility],  [facility]

## 2023-08-21 NOTE — Op Note (Signed)
 Operative Note  Debra Horn  [id number]  [id number]  08/21/2023   Surgeon: Mitzie Freund MD FACS   Assistant: Camellia Blush MD FACS   Procedure performed: laparoscopic sleeve gastrectomy, hiatal hernia repair, upper endoscopy   Preop diagnosis: Morbid obesity Body mass index is 50.3 kg/m. Post-op diagnosis/intraop findings: same   Specimens: fundus Retained items: none  EBL: minimal  Complications: none   Description of procedure: After confirming informed consent and administration of chemical DVT prophylaxis in holding, the patient was taken to the operating room and placed supine on operating room table where general endotracheal anesthesia was initiated, preoperative antibiotics were administered, SCDs applied, and a formal timeout was performed. The abdomen was prepped and draped in usual sterile fashion. Peritoneal access was gained using a Visiport technique in the left upper quadrant and insufflation to 15 mmHg ensued without issue. Gross inspection revealed no evidence of injury. There are omental adhesions to the central abdominal wall which were left alone. Under direct visualization three more 5 mm trochars were placed in the right and left hemiabdomen and the 15mm trocar in the right paramedian upper abdomen. Bilateral laparoscopic assisted TAPS blocks were performed with 0.25% Marcaine  with epinephrine . The patient was placed in steep reverseTrendelenburg and the liver retractor was introduced through an incision in the upper midline and secured to the post externally to maintain the left lobe retracted anteriorly.  On direct inspection there is a small sliding hiatal hernia. The pars lucida was entered with Harmonic scalpel and the posterior aspect of the right and left crus were dissected out using Harmonic and blunt dissection. A small lipoma was excised from the posterior aspect of the hernia sac. The hiatus was narrowed with a single suture of 0 Ethibond secured with the  ty-knot device.  Using the Harmonic scalpel, the greater curvature of the stomach was dissected away from the greater omentum and short gastric vessels were divided. This began 6 cm from the pylorus, and dissection proceeded until the left crus was clearly exposed. The 84 [place] VisiGi was then introduced and directed down towards the pylorus. This was placed to suction against the lesser curve. Serial fires of the linear cutting stapler were then employed to create our sleeve. The first fire used a gold load and ensured adequate room at the angularis incisura. Several blue loads were then employed to create an evenly tubular stomach preserving 1cm lateral to the angle of His. The excised stomach was then removed through our 15 mm trocar site within an Endo Catch bag.  The visigi was taken off of suction and a few puffs of air were introduced, inflating the sleeve. No bubbles were observed in the irrigation fluid around the stomach and the shape was noted to be evenly tubular without any narrowing at the angularis. The visigi was then removed. Upper endoscopy was performed by the assistant surgeon and the sleeve was noted to be airtight, the staple line was hemostatic and the lumen easily traversible to the level of the pylorus. Please see his separate note. The endoscope was removed. A small amount of oozing on the staple line (distal two fires as well as most proximal fire) was addressed with clips. The 15 mm trocar site fascia in the right upper abdomen was closed with a 0 Vicryl using the laparoscopic suture passer under direct visualization. The liver retractor was removed under direct visualization. The abdomen was then desufflated and all remaining trochars removed. The skin incisions were closed with subcuticular 4-0 Monocryl;  benzoin, Steri-Strips and Band-Aids were applied The patient was then awakened, extubated and taken to PACU in stable condition.     All counts were correct at the completion of  the case.

## 2023-08-21 NOTE — Transfer of Care (Signed)
 Immediate Anesthesia Transfer of Care Note  Patient: Debra Horn  Procedure(s) Performed: GASTRECTOMY, SLEEVE, LAPAROSCOPIC ENDOSCOPY, UPPER GI TRACT  Patient Location: PACU  Anesthesia Type:General  Level of Consciousness: drowsy  Airway & Oxygen Therapy: Patient Spontanous Breathing and Patient connected to face mask oxygen  Post-op Assessment: Report given to RN and Post -op Vital signs reviewed and stable  Post vital signs: Reviewed and stable  Last Vitals:  Vitals Value Taken Time  BP 128/78 08/21/23 09:15  Temp    Pulse 71 08/21/23 09:19  Resp 18 08/21/23 09:19  SpO2 100 % 08/21/23 09:19  Vitals shown include unfiled device data.  Last Pain:  Vitals:   08/21/23 0607  TempSrc:   PainSc: 0-No pain      Patients Stated Pain Goal: 3 (08/21/23 9392)  Complications: No notable events documented.

## 2023-08-21 NOTE — Progress Notes (Addendum)
 PHARMACY CONSULT FOR:  Risk Assessment for Post-Discharge VTE Following Bariatric Surgery  Procedure* Laparoscopic sleeve gastrectomy, hiatal hernia repair, upper endoscopy   Sex F  Black race Y  Age (years) 85  BMI (kg/m2) 50.3  Operation duration (minutes) 68  History of VTE requiring treatment* Y  Hypercoagulable condition* N  Liver disorder* N  Pre-op venous stasis N  Pre-op functional health status Independent   Previous foregut or bariatric surg N  Post-op surgical site infection N  Transfusion intra- or post-op* N  Unplanned readmission N  Unplanned reoperation N  GI perforation/leak/obstruction* N  *specific risk factors for portomesenteric venous thrombosis   Predicted probability of 30-day post-discharge VTE:    0.46 % estimated using the [facility] Calculator  Other patient-specific factors to consider:   Recommendation for Discharge: Enoxaparin  60 mg Bentley q12h x 30 days post-discharge   Debra Horn is a 38 y.o. female who underwent  laparoscopic sleeve gastrectomy, hiatal hernia repair, upper endoscopy on 8/19.   Case start: 0752 Case end: 0900   Allergies  Allergen Reactions   Banana Anaphylaxis   Cefixime Rash    Patient Measurements: Height: 5' 2 (157.5 cm) Weight: 124.7 kg (275 lb) IBW/kg (Calculated) : 50.1 Body mass index is 50.3 kg/m.  No results for input(s): WBC, HGB, HCT, PLT, APTT, CREATININE, LABCREA, CREAT24HRUR, MG, PHOS, ALBUMIN, PROT, AST, ALT, ALKPHOS, BILITOT, BILIDIR, IBILI in the last 72 hours. Estimated Creatinine Clearance: 120.3 mL/min (by C-G formula based on SCr of 0.78 mg/dL).    Past Medical History:  Diagnosis Date   Anemia    low iron    Asthma    Complication of anesthesia    bp dropped in middle of last c-section and started with n/v   GERD (gastroesophageal reflux disease)    during pregnancy   Headache    couple days/week (10/28/2015)    Hyperthyroidism    not on medication   PE (pulmonary embolism) 11/2011   PONV (postoperative nausea and vomiting)    Pregnancy induced hypertension    Preterm labor    Sleep apnea    SOB (shortness of breath)    Thyroid  condition    Vitamin D  deficiency      Medications Prior to Admission  Medication Sig Dispense Refill Last Dose/Taking   albuterol  (VENTOLIN  HFA) 108 (90 Base) MCG/ACT inhaler Inhale 2 puffs into the lungs every 6 (six) hours as needed for wheezing or shortness of breath. 8 g 2 08/20/2023   famotidine  (PEPCID ) 40 MG tablet Take 1 tablet (40 mg total) by mouth daily. 90 tablet 1 08/20/2023   folic acid  (FOLVITE ) 1 MG tablet Take 1 tablet (1 mg total) by mouth daily. 30 tablet 2 Past Week   Olopatadine-Mometasone (RYALTRIS ) 665-25 MCG/ACT SUSP Place 2 sprays into the nose 2 (two) times daily as needed. 29 g 5 Past Month   pantoprazole  (PROTONIX ) 40 MG tablet Take 1 tablet (40 mg total) by mouth 2 (two) times daily. 60 tablet 0 08/20/2023   Vitamin D , Ergocalciferol , (DRISDOL ) 1.25 MG (50000 UNIT) CAPS capsule Take 1 capsule (50,000 Units total) by mouth every 3 (three) days. 8 capsule 0 Past Week   budesonide -glycopyrrolate -formoterol  (BREZTRI  AEROSPHERE) 160-9-4.8 MCG/ACT AERO inhaler Inhale 2 puffs into the lungs in the morning and at bedtime. (Patient not taking: Reported on 08/08/2023) 10.7 g 5    EPINEPHrine  0.3 mg/0.3 mL IJ SOAJ injection Inject 0.3 mg into the muscle as needed for anaphylaxis. 1 each  1 More than a month   fluticasone  (FLOVENT  HFA) 110 MCG/ACT inhaler Inhale 2 puffs into the lungs in the morning and at bedtime. 12 g 5 More than a month   levocetirizine (XYZAL ) 5 MG tablet Take 1 tablet (5 mg total) by mouth every evening. (Patient not taking: Reported on 08/14/2023) 30 tablet 5 More than a month       Debra Horn 08/21/2023,9:51 AM

## 2023-08-21 NOTE — Telephone Encounter (Signed)
 Patient Product/process development scientist completed.    The patient is insured through  [facility].     Ran test claim for enoxaparin  (Lovenox ) 60 mg/0.6 ml and the current 30 day co-pay is $4.00.   This test claim was processed through [facility]- copay amounts may vary at other pharmacies due to pharmacy/plan contracts, or as the patient moves through the different stages of their insurance plan.     Reyes Sharps, CPHT Pharmacy Technician III Certified Patient Advocate [facility] Patient Advocate Team Direct Number: [phone]  Fax: [phone]

## 2023-08-21 NOTE — Anesthesia Postprocedure Evaluation (Signed)
 Anesthesia Post Note  Patient: Debra Horn  Procedure(s) Performed: GASTRECTOMY, SLEEVE, LAPAROSCOPIC ENDOSCOPY, UPPER GI TRACT     Patient location during evaluation: PACU Anesthesia Type: General Level of consciousness: awake and alert Pain management: pain level controlled Vital Signs Assessment: post-procedure vital signs reviewed and stable Respiratory status: spontaneous breathing, nonlabored ventilation and respiratory function stable Cardiovascular status: blood pressure returned to baseline and stable Postop Assessment: no apparent nausea or vomiting Anesthetic complications: no   No notable events documented.  Last Vitals:  Vitals:   08/21/23 1030 08/21/23 1046  BP:  (!) 141/81  Pulse: 60 (!) 54  Resp: 16 17  Temp:  36.6 C  SpO2: 100% 100%    Last Pain:  Vitals:   08/21/23 1046  TempSrc: Oral  PainSc: Asleep                 Tyona Nilsen,W. EDMOND

## 2023-08-22 ENCOUNTER — Encounter (HOSPITAL_COMMUNITY): Payer: Self-pay | Admitting: Surgery

## 2023-08-22 LAB — CBC
HCT: 37.8 % (ref 36.0–46.0)
Hemoglobin: 12.1 g/dL (ref 12.0–15.0)
MCH: 29.6 pg (ref 26.0–34.0)
MCHC: 32 g/dL (ref 30.0–36.0)
MCV: 92.4 fL (ref 80.0–100.0)
Platelets: 355 K/uL (ref 150–400)
RBC: 4.09 MIL/uL (ref 3.87–5.11)
RDW: 14 % (ref 11.5–15.5)
WBC: 6.7 K/uL (ref 4.0–10.5)
nRBC: 0 % (ref 0.0–0.2)

## 2023-08-22 LAB — COMPREHENSIVE METABOLIC PANEL WITH GFR
ALT: 19 U/L (ref 0–44)
AST: 20 U/L (ref 15–41)
Albumin: 3.1 g/dL — ABNORMAL LOW (ref 3.5–5.0)
Alkaline Phosphatase: 68 U/L (ref 38–126)
Anion gap: 10 (ref 5–15)
BUN: 7 mg/dL (ref 6–20)
CO2: 20 mmol/L — ABNORMAL LOW (ref 22–32)
Calcium: 8.5 mg/dL — ABNORMAL LOW (ref 8.9–10.3)
Chloride: 101 mmol/L (ref 98–111)
Creatinine, Ser: 0.37 mg/dL — ABNORMAL LOW (ref 0.44–1.00)
GFR, Estimated: >60 mL/min (ref >60)
Glucose, Bld: 88 mg/dL (ref 70–99)
Potassium: 3.5 mmol/L (ref 3.5–5.1)
Sodium: 131 mmol/L — ABNORMAL LOW (ref 135–145)
Total Bilirubin: 0.6 mg/dL (ref 0.0–1.2)
Total Protein: 7 g/dL (ref 6.5–8.1)

## 2023-08-22 LAB — MAGNESIUM: Magnesium: 1.9 mg/dL (ref 1.7–2.4)

## 2023-08-22 MED ORDER — ENOXAPARIN SODIUM 60 MG/0.6ML IJ SOSY: 60.0000 mg | PREFILLED_SYRINGE | Freq: Two times a day (BID) | INTRAMUSCULAR | 0 refills | Status: DC

## 2023-08-22 MED ORDER — PANTOPRAZOLE SODIUM 40 MG PO TBEC: 40.0000 mg | DELAYED_RELEASE_TABLET | Freq: Every day | ORAL | 0 refills | Status: DC

## 2023-08-22 MED ORDER — ONDANSETRON 4 MG PO TBDP: 4.0000 mg | ORAL_TABLET | Freq: Four times a day (QID) | ORAL | 0 refills | Status: DC | PRN

## 2023-08-22 MED ORDER — ENOXAPARIN (LOVENOX) PATIENT EDUCATION KIT
PACK | Freq: Once | Status: AC
  Filled 2023-08-22: qty 1

## 2023-08-22 MED ORDER — ACETAMINOPHEN 500 MG PO TABS
1000.0000 mg | ORAL_TABLET | Freq: Three times a day (TID) | ORAL | Status: AC
Start: 2023-08-22 — End: 2023-08-27

## 2023-08-22 MED ORDER — TRAMADOL HCL 50 MG PO TABS: 50.0000 mg | ORAL_TABLET | Freq: Four times a day (QID) | ORAL | 0 refills | Status: DC | PRN

## 2023-08-22 NOTE — Progress Notes (Signed)
 Patient walked in the hall this morning. She is refusing to get out of bed to walk or sit in the chair. She is currently working on her 2nd cup of protein.

## 2023-08-22 NOTE — Progress Notes (Signed)
   08/22/23 0948  TOC Brief Assessment  Insurance and Status Reviewed  Patient has primary care physician Yes  Home environment has been reviewed resides in private residence  Prior level of function: Independent  Prior/Current Home Services No current home services  Social Drivers of Health Review SDOH reviewed no interventions necessary  Readmission risk has been reviewed Yes  Transition of care needs no transition of care needs at this time

## 2023-08-22 NOTE — Progress Notes (Signed)
 S: Patient reports that she slept all night.  Has walked 1 time.  Reports pain which is controlled with medication.  Tolerating liquids without nausea, dysphagia, or reflux symptoms.  O: Vitals, labs, intake/output, and orders reviewed at this time.  Afebrile, no tachycardia, normotensive to mildly hypertensive, satting 99% on room air. PO 300; UOP 3500 + 2 occurrences.  CBC unremarkable (white count 6.7, hemoglobin stable at 12.1, platelets 355); magnesium  1.9, CMP appears globally diluted (creatinine 0.37 from 0.78 preop, sodium 131 from 136, potassium 3.5, stable)  PRN meds since arriving to floor: Oxycodone  x 1 (7:30 PM last night)   Gen: A&Ox3, no distress but somewhat sleepy H&N: EOMI, atraumatic, neck supple Chest: unlabored respirations, RRR Abd: soft, appropriately mildly tender, nondistended, incision(s) c/d/i with Steri-Strips, no cellulitis or hematoma Ext: warm, no edema Neuro: grossly normal  Lines/tubes/drains: PIV  A/P: 38 year old woman postop day 1 status post laparoscopic sleeve gastrectomy with hiatal hernia repair - Continue clear liquids, protein shakes - Needs to ambulate more.  Continue SCDs while in bed, subcu heparin , aggressive pulmonary toilet - Will DC gabapentin  as she seems somewhat somnolent this morning - Anticipate discharge home this afternoon   Mitzie Freund, MD [facility], [place]

## 2023-08-22 NOTE — Plan of Care (Signed)

## 2023-08-22 NOTE — Progress Notes (Signed)
 Patient alert and oriented when initially rounded on the patient, pain is  being addressed with PO pain medication. Noted that patient fell asleep quickly after receiving pain medications this AM!  Patient is tolerating fluids, just advanced to protein shake today. Reviewed Gastric sleeve/bypass discharge instructions with patient and family and patient is able to articulate understanding. Provided information on BELT program, Support Group, BSTOP-D, and [facility] outpatient pharmacy. Communicated general update of patient status to surgeon. All questions answered. 24hr PO fluid recall is 360 mL  Encouraged patient to ambulate, and set timers to stay consistent with sipping .   per hydration protocol, bariatric nurse coordinator to make follow-up phone call within one week.    Thank you,  Roseann Medley, RN, MSN Bariatric Nurse Coordinator [phone] (office)

## 2023-08-23 NOTE — Progress Notes (Signed)
 Patient was given discharge instructions, and all questions were answered. Patient was stable for discharge and was walked to the main exit.

## 2023-08-23 NOTE — Discharge Summary (Signed)
 Physician Discharge Summary  Debra Horn FMW:[id number] DOB: Jan 16, 1985 DOA: 08/21/2023  PCP: Lendia Boby CROME, NP-C  Admit date: 08/21/2023 Discharge date: 08/23/2023   Recommendations for Outpatient Follow-up:    Follow-up Information     Maczis, Puja Gosai, PA-C Follow up on 09/13/2023.   Specialty: General Surgery Why: Please arrive 15 minutes prior to your appointment at 9:15am with Tonja Barban on behalf of Dr. Signe. Contact information: [address] [facility] [place] [facility] [place] [address] [phone]         Tari Tonja Barban, PA-C Follow up on 10/19/2023.   Specialty: General Surgery Why: Please arrive 15 minutes prior to your appointment at 9:15am with Tonja Barban on behalf of Dr. Signe. Contact information: [address] [facility] [place] [facility] [place] [address] [phone]                Discharge Diagnoses:  Principal Problem:   Morbid obesity (HCC)   Surgical Procedure: Laparoscopic Sleeve Gastrectomy, upper endoscopy  Discharge Condition: Good Disposition: Home  Diet recommendation: Postoperative sleeve gastrectomy diet (liquids only)  Filed Weights   08/21/23 0538 08/21/23 0607  Weight: 124.7 kg 124.7 kg     Hospital Course:  The patient was admitted for a planned laparoscopic sleeve gastrectomy. Please see operative note. Preoperatively the patient was given 5000 units of subcutaneous heparin  for DVT prophylaxis. Postoperative prophylactic heparin  dosing was started on the evening of postoperative day 0. ERAS protocol was used. On the evening of postoperative day 0, the patient was started on water and ice chips. On postoperative day 1 the patient had no fever or tachycardia and was tolerating water in their diet was gradually advanced throughout the day. The patient was ambulating without difficulty. Their vital signs are stable without fever or tachycardia. Their hemoglobin had remained  stable. The patient was maintained on their home settings for CPAP therapy. She did not complete protein goals and was not mobilizing enough on POD 1 to go home, but this improved by POD 2. The patient had received discharge instructions and counseling. They were deemed stable for discharge and had met discharge criteria   Discharge Instructions   Allergies as of 08/23/2023       Reactions   Banana Anaphylaxis   Cefixime Rash        Medication List     STOP taking these medications    famotidine  40 MG tablet Commonly known as: Pepcid        TAKE these medications    acetaminophen  500 MG tablet Commonly known as: TYLENOL  Take 2 tablets (1,000 mg total) by mouth every 8 (eight) hours for 5 days.   albuterol  108 (90 Base) MCG/ACT inhaler Commonly known as: VENTOLIN  HFA Inhale 2 puffs into the lungs every 6 (six) hours as needed for wheezing or shortness of breath.   Breztri  Aerosphere 160-9-4.8 MCG/ACT Aero inhaler Generic drug: budesonide -glycopyrrolate -formoterol  Inhale 2 puffs into the lungs in the morning and at bedtime.   enoxaparin  60 MG/0.6ML injection Commonly known as: LOVENOX  Inject 0.6 mLs (60 mg total) into the skin every 12 (twelve) hours.   EPINEPHrine  0.3 mg/0.3 mL Soaj injection Commonly known as: EPI-PEN Inject 0.3 mg into the muscle as needed for anaphylaxis.   fluticasone  110 MCG/ACT inhaler Commonly known as: Flovent  HFA Inhale 2 puffs into the lungs in the morning and at bedtime.   folic acid  1 MG tablet Commonly known as: FOLVITE  Take 1 tablet (1 mg total) by mouth  daily.   levocetirizine 5 MG tablet Commonly known as: XYZAL  Take 1 tablet (5 mg total) by mouth every evening.   ondansetron  4 MG disintegrating tablet Commonly known as: ZOFRAN -ODT Take 1 tablet (4 mg total) by mouth every 6 (six) hours as needed for nausea or vomiting.   pantoprazole  40 MG tablet Commonly known as: PROTONIX  Take 1 tablet (40 mg total) by mouth daily.  Take this medication daily, regardless of reflux symptoms What changed:  when to take this additional instructions   Ryaltris  665-25 MCG/ACT Susp Generic drug: Olopatadine-Mometasone Place 2 sprays into the nose 2 (two) times daily as needed.   traMADol  50 MG tablet Commonly known as: ULTRAM  Take 1 tablet (50 mg total) by mouth every 6 (six) hours as needed for moderate pain (pain score 4-6) or severe pain (pain score 7-10).   Vitamin D  (Ergocalciferol ) 1.25 MG (50000 UNIT) Caps capsule Commonly known as: DRISDOL  Take 1 capsule (50,000 Units total) by mouth every 3 (three) days.        Follow-up Information     Maczis, Puja Gosai, PA-C Follow up on 09/13/2023.   Specialty: General Surgery Why: Please arrive 15 minutes prior to your appointment at 9:15am with Tonja Barban on behalf of Dr. Signe. Contact information: [address] [facility] [address] [facility] [place] [facility] [place] [address] [phone]         Tari Tonja Barban, PA-C Follow up on 10/19/2023.   Specialty: General Surgery Why: Please arrive 15 minutes prior to your appointment at 9:15am with Tonja Barban on behalf of Dr. Signe. Contact information: [address] [facility] [place] [facility] [place] [address] [phone]                  The results of significant diagnostics from this hospitalization (including imaging, microbiology, ancillary and laboratory) are listed below for reference.    Significant Diagnostic Studies: DG UGI W DOUBLE CM (HD BA) Result Date: 08/15/2023 CLINICAL DATA:  38 year old female with history of atypical reflux and globus sensation with plans for upcoming bariatric surgery who presents for pre-operative evaluation. EXAM: UPPER GI SERIES WITH HIGH DENSITY WITHOUT KUB TECHNIQUE: Scout radiograph was obtained. Combined double and single contrast examination was performed using effervescent crystals, high-density barium and thin liquid barium. This  exam was performed by Solmon Ku PA-C, and was supervised and interpreted by Dasie Hamburg, MD. FLUOROSCOPY TIME:  Radiation Exposure Index (as provided by the fluoroscopic device): 26.0 mGy COMPARISON:  Upper GI 04/26/2022 FINDINGS: Scout Radiograph: No evidence of bowel obstruction. Esophagus: No evidence of mass or stricture. Esophageal motility: Very small volume proximal escape of barium bolus to the upper to mid esophagus. Gastroesophageal reflux: None visualized. Ingested 13 mm barium tablet: The patient was unable to swallow the tablet. Stomach: Very small sliding hiatal hernia. Gastric emptying: Normal. Duodenum: Normal appearance. Other:  None. IMPRESSION: 1. Very small hiatal hernia. 2. Otherwise largely unremarkable upper GI series. Electronically Signed   By: Dasie Hamburg M.D.   On: 08/15/2023 16:14    Labs: Basic Metabolic Panel: Recent Labs  Lab 08/22/23 0504  NA 131*  K 3.5  CL 101  CO2 20*  GLUCOSE 88  BUN 7  CREATININE 0.37*  CALCIUM 8.5*  MG 1.9   Liver Function Tests: Recent Labs  Lab 08/22/23 0504  AST 20  ALT 19  ALKPHOS 68  BILITOT 0.6  PROT 7.0  ALBUMIN 3.1*    CBC: Recent Labs  Lab 08/22/23  0504  WBC 6.7  HGB 12.1  HCT 37.8  MCV 92.4  PLT 355    CBG: No results for input(s): GLUCAP in the last 168 hours.  Principal Problem:   Morbid obesity (HCC)   Signed:  Mitzie DELENA Freund MD [facility] [phone] 08/23/2023, 8:57 AM

## 2023-08-24 ENCOUNTER — Encounter (HOSPITAL_COMMUNITY): Payer: Self-pay

## 2023-08-24 ENCOUNTER — Emergency Department (HOSPITAL_COMMUNITY)
Admission: EM | Admit: 2023-08-24 | Discharge: 2023-08-25 | Disposition: A | Discharge status: Home / Self Care | Attending: Emergency Medicine | Admitting: Emergency Medicine

## 2023-08-24 ENCOUNTER — Other Ambulatory Visit: Payer: Self-pay

## 2023-08-24 DIAGNOSIS — Z9884 Bariatric surgery status: Secondary | ICD-10-CM | POA: Diagnosis not present

## 2023-08-24 DIAGNOSIS — M79605 Pain in left leg: Secondary | ICD-10-CM | POA: Insufficient documentation

## 2023-08-24 DIAGNOSIS — J45909 Unspecified asthma, uncomplicated: Secondary | ICD-10-CM | POA: Insufficient documentation

## 2023-08-24 DIAGNOSIS — M79669 Pain in unspecified lower leg: Secondary | ICD-10-CM | POA: Diagnosis not present

## 2023-08-24 DIAGNOSIS — Z7901 Long term (current) use of anticoagulants: Secondary | ICD-10-CM | POA: Diagnosis not present

## 2023-08-24 DIAGNOSIS — Z86711 Personal history of pulmonary embolism: Secondary | ICD-10-CM | POA: Diagnosis not present

## 2023-08-24 LAB — CBC
HCT: 39.2 % (ref 36.0–46.0)
Hemoglobin: 12.7 g/dL (ref 12.0–15.0)
MCH: 29.2 pg (ref 26.0–34.0)
MCHC: 32.4 g/dL (ref 30.0–36.0)
MCV: 90.1 fL (ref 80.0–100.0)
Platelets: 349 K/uL (ref 150–400)
RBC: 4.35 MIL/uL (ref 3.87–5.11)
RDW: 13.8 % (ref 11.5–15.5)
WBC: 8 K/uL (ref 4.0–10.5)
nRBC: 0 % (ref 0.0–0.2)

## 2023-08-24 LAB — BASIC METABOLIC PANEL WITH GFR
Anion gap: 11 (ref 5–15)
BUN: 9 mg/dL (ref 6–20)
CO2: 23 mmol/L (ref 22–32)
Calcium: 9.3 mg/dL (ref 8.9–10.3)
Chloride: 101 mmol/L (ref 98–111)
Creatinine, Ser: 0.62 mg/dL (ref 0.44–1.00)
GFR, Estimated: >60 mL/min (ref >60)
Glucose, Bld: 77 mg/dL (ref 70–99)
Potassium: 3.5 mmol/L (ref 3.5–5.1)
Sodium: 135 mmol/L (ref 135–145)

## 2023-08-24 NOTE — ED Notes (Signed)
Pt called for room. No answer 

## 2023-08-24 NOTE — ED Triage Notes (Signed)
 Pt came in for left leg pain. Pt recently had weight loss surgery on Tuesday and was told to come to the ED for re-evaluation.

## 2023-08-25 ENCOUNTER — Ambulatory Visit (HOSPITAL_BASED_OUTPATIENT_CLINIC_OR_DEPARTMENT_OTHER)
Admission: RE | Admit: 2023-08-25 | Discharge: 2023-08-25 | Disposition: A | Discharge status: Home / Self Care | Source: Ambulatory Visit | Attending: Emergency Medicine | Admitting: Emergency Medicine

## 2023-08-25 DIAGNOSIS — Z7901 Long term (current) use of anticoagulants: Secondary | ICD-10-CM | POA: Insufficient documentation

## 2023-08-25 DIAGNOSIS — Z9884 Bariatric surgery status: Secondary | ICD-10-CM | POA: Insufficient documentation

## 2023-08-25 DIAGNOSIS — M79605 Pain in left leg: Secondary | ICD-10-CM | POA: Diagnosis not present

## 2023-08-25 DIAGNOSIS — M79669 Pain in unspecified lower leg: Secondary | ICD-10-CM | POA: Insufficient documentation

## 2023-08-25 DIAGNOSIS — M79662 Pain in left lower leg: Secondary | ICD-10-CM

## 2023-08-25 DIAGNOSIS — Z86711 Personal history of pulmonary embolism: Secondary | ICD-10-CM | POA: Insufficient documentation

## 2023-08-25 LAB — BPAM RBC
Blood Product Expiration Date: 202509092359
Blood Product Expiration Date: 202509092359
Unit Type and Rh: 7300
Unit Type and Rh: 7300

## 2023-08-25 LAB — TYPE AND SCREEN
ABO/RH(D): B POS
Antibody Screen: POSITIVE
Unit division: 0
Unit division: 0

## 2023-08-25 MED ORDER — ENOXAPARIN SODIUM 60 MG/0.6ML IJ SOSY
60.0000 mg | PREFILLED_SYRINGE | Freq: Once | INTRAMUSCULAR | Status: AC
  Administered 2023-08-25: 60 mg via SUBCUTANEOUS
  Filled 2023-08-25: qty 0.6

## 2023-08-25 NOTE — Progress Notes (Signed)
 VASCULAR LAB    Left lower extremity venous duplex has been performed.  See CV proc for preliminary results.   Shir Bergman, RVT 08/25/2023, 12:02 PM

## 2023-08-25 NOTE — ED Provider Notes (Signed)
 [place] EMERGENCY DEPARTMENT AT [facility] Provider Note  CSN: [id number] Arrival date & time: 08/24/23 2028  Chief Complaint(s) Leg Pain and Post-op Problem  HPI GISSELE Horn is a 38 y.o. female with a past medical history listed below including prior pulmonary embolism attributed to OCP use.  She presents for left leg pain.  She is 4 days postop from gastric sleeve.  She denied any swelling.  No trauma.  No erythema.  She called on-call nurse who instructed her to come to the emergency department to rule out DVT.  Patient is currently on Lovenox  prophylaxis twice daily and has not missed any doses.  Last dose was 7 PM.  The history is provided by the patient.    Past Medical History Past Medical History:  Diagnosis Date   Anemia    low iron    Asthma    Complication of anesthesia    bp dropped in middle of last c-section and started with n/v   GERD (gastroesophageal reflux disease)    during pregnancy   Headache    couple days/week (10/28/2015)   Hyperthyroidism    not on medication   PE (pulmonary embolism) 11/2011   PONV (postoperative nausea and vomiting)    Pregnancy induced hypertension    Preterm labor    Sleep apnea    SOB (shortness of breath)    Thyroid  condition    Vitamin D  deficiency    Patient Active Problem List   Diagnosis Date Noted   Morbid obesity (HCC) 08/21/2023   Anemia 05/11/2023   Iron  deficiency anemia 05/25/2022   Shortness of breath 08/22/2021   Moderate persistent asthma with exacerbation 07/28/2021   Diffuse pain 05/31/2021   Allergic reaction 05/11/2021   Iron  deficiency anemia due to chronic blood loss 03/07/2021   Low folate 03/07/2021   Class 3 severe obesity with serious comorbidity and body mass index (BMI) of 40.0 to 44.9 in adult 03/03/2021   CRP elevated 12/01/2020   Elevated sed rate 12/01/2020   Nasal drainage 11/09/2020   Insulin  resistance 10/26/2020   Other chest pain 10/13/2020   Severe sleep  apnea 06/24/2020   BMI 45.0-49.9, adult (HCC) 06/24/2020   Status post repeat low transverse cesarean section 07/27/2017   Poor fetal growth affecting management of mother in third trimester    Previous pregnancy complicated by pregnancy-induced hypertension in third trimester, antepartum    IUGR (intrauterine growth restriction) in prior pregnancy, pregnant 03/06/2017   Vitamin D  deficiency 02/17/2017   History of preterm delivery 02/09/2017   History of asthma    Normocytic anemia 02/06/2016   Family history of systemic lupus erythematosus (SLE) in mother 02/19/2014   Hyperthyroidism 02/19/2014   Previous cesarean section 08/08/2012   History of pulmonary embolus (PE) 07/24/2012   Home Medication(s) Prior to Admission medications   Medication Sig Start Date End Date Taking? Authorizing Provider  acetaminophen  (TYLENOL ) 500 MG tablet Take 2 tablets (1,000 mg total) by mouth every 8 (eight) hours for 5 days. 08/22/23 08/27/23  Signe Mitzie DELENA, MD  albuterol  (VENTOLIN  HFA) 108 [phone] Base) MCG/ACT inhaler Inhale 2 puffs into the lungs every 6 (six) hours as needed for wheezing or shortness of breath. 08/07/23   Ward, Jessica Z, PA-C  budesonide -glycopyrrolate -formoterol  (BREZTRI  AEROSPHERE) 160-9-4.8 MCG/ACT AERO inhaler Inhale 2 puffs into the lungs in the morning and at bedtime. 01/30/23   Iva Marty Saltness, MD  enoxaparin  (LOVENOX ) 60 MG/0.6ML injection Inject 0.6 mLs (60 mg total) into the skin every  12 (twelve) hours. 08/22/23 09/21/23  Signe Mitzie LABOR, MD  EPINEPHrine  0.3 mg/0.3 mL IJ SOAJ injection Inject 0.3 mg into the muscle as needed for anaphylaxis. 02/23/23   Lorin Norris, MD  fluticasone  (FLOVENT  HFA) 110 MCG/ACT inhaler Inhale 2 puffs into the lungs in the morning and at bedtime. 01/30/23   Iva Marty Saltness, MD  folic acid  (FOLVITE ) 1 MG tablet Take 1 tablet (1 mg total) by mouth daily. 05/11/23   Heilingoetter, Cassandra L, PA-C  levocetirizine (XYZAL ) 5 MG tablet Take 1  tablet (5 mg total) by mouth every evening. 01/30/23   Iva Marty Saltness, MD  Olopatadine-Mometasone (RYALTRIS ) 665-25 MCG/ACT SUSP Place 2 sprays into the nose 2 (two) times daily as needed. 01/31/23   Iva Marty Saltness, MD  ondansetron  (ZOFRAN -ODT) 4 MG disintegrating tablet Take 1 tablet (4 mg total) by mouth every 6 (six) hours as needed for nausea or vomiting. 08/22/23   Signe Mitzie LABOR, MD  pantoprazole  (PROTONIX ) 40 MG tablet Take 1 tablet (40 mg total) by mouth daily. Take this medication daily, regardless of reflux symptoms 08/22/23   Signe Mitzie LABOR, MD  traMADol  (ULTRAM ) 50 MG tablet Take 1 tablet (50 mg total) by mouth every 6 (six) hours as needed for moderate pain (pain score 4-6) or severe pain (pain score 7-10). 08/22/23   Signe Mitzie LABOR, MD  Vitamin D , Ergocalciferol , (DRISDOL ) 1.25 MG (50000 UNIT) CAPS capsule Take 1 capsule (50,000 Units total) by mouth every 3 (three) days. 06/07/23   Jonel Pee D, NP                                                                                                                                    Allergies Banana and Cefixime  Review of Systems Review of Systems As noted in HPI  Physical Exam Vital Signs  I have reviewed the triage vital signs BP (!) 162/91 (BP Location: Left Arm)   Pulse 73   Temp (!) 97.5 F (36.4 C) (Oral)   Resp 18   LMP 08/12/2023 (Exact Date)   SpO2 100%   Physical Exam Vitals reviewed.  Constitutional:      General: She is not in acute distress.    Appearance: She is well-developed. She is obese. She is not diaphoretic.  HENT:     Head: Normocephalic and atraumatic.     Right Ear: External ear normal.     Left Ear: External ear normal.     Nose: Nose normal.  Eyes:     General: No scleral icterus.    Conjunctiva/sclera: Conjunctivae normal.  Neck:     Trachea: Phonation normal.  Cardiovascular:     Rate and Rhythm: Normal rate and regular rhythm.  Pulmonary:     Effort: Pulmonary effort  is normal. No respiratory distress.     Breath sounds: No stridor.  Abdominal:     General: There is no distension.  Comments: Trochar incision are C/D/I  Musculoskeletal:        General: Normal range of motion.     Cervical back: Normal range of motion.     Left lower leg: Tenderness present. No swelling. No edema.  Neurological:     Mental Status: She is alert and oriented to person, place, and time.  Psychiatric:        Behavior: Behavior normal.     ED Results and Treatments Labs (all labs ordered are listed, but only abnormal results are displayed) Labs Reviewed  BASIC METABOLIC PANEL WITH GFR  CBC                                                                                                                         EKG  EKG Interpretation Date/Time:    Ventricular Rate:    PR Interval:    QRS Duration:    QT Interval:    QTC Calculation:   R Axis:      Text Interpretation:         Radiology No results found.  Medications Ordered in ED Medications  enoxaparin  (LOVENOX ) injection 60 mg (has no administration in time range)   Procedures Procedures  (including critical care time) Medical Decision Making / ED Course   Medical Decision Making Amount and/or Complexity of Data Reviewed Labs: ordered.  Risk Prescription drug management.    Left leg pain differential diagnosis considered.  Exam is not concerning for arterial occlusion.  No evidence of cellulitis or infection.  Palpable muscular etiology but will need to rule out DVT. CBC without leukocytosis or anemia.  No thrombocytopenia.  Metabolic panel without significant electrolyte derangements or renal sufficiency.  Additional dose of Lovenox  given to provide treatment dose.  Will have patient return in the morning for ultrasound.    Final Clinical Impression(s) / ED Diagnoses Final diagnoses:  Left leg pain   The patient appears reasonably screened and/or stabilized for discharge and I  doubt any other medical condition or other [facility] requiring further screening, evaluation, or treatment in the ED at this time. I have discussed the findings, Dx and Tx plan with the patient/family who expressed understanding and agree(s) with the plan. Discharge instructions discussed at length. The patient/family was given strict return precautions who verbalized understanding of the instructions. No further questions at time of discharge.  Disposition: Discharge  Condition: Good  ED Discharge Orders          Ordered    LE Venous       Comments: IMPORTANT PATIENT INSTRUCTIONS: You have been scheduled for an Outpatient Vascular Study at [facility].  If tomorrow is a Saturday, Sunday or holiday, please go to the [facility] Emergency Department Registration Desk at 11 am tomorrow morning and tell them you are there for a vascular study.  If tomorrow is a weekday (Monday-Friday), please go to the [facility] (address [address] [place]) at 8  am and report to the 4th floor registration Zone A.  Inform registration that you are there for a vascular study.   08/25/23 0115             Follow Up: Lendia Boby CROME, NP-C [address] [place] [address] [phone]  Call  to schedule an appointment for close follow up     This chart was dictated using voice recognition software.  Despite best efforts to proofread,  errors can occur which can change the documentation meaning.    Trine Raynell Moder, MD 08/25/23 [phone]

## 2023-08-27 LAB — SURGICAL PATHOLOGY

## 2023-08-28 ENCOUNTER — Telehealth (HOSPITAL_COMMUNITY): Payer: Self-pay | Admitting: *Deleted

## 2023-08-28 NOTE — Telephone Encounter (Signed)
 Post-op follow-up attempt, voicemail left with call back number

## 2023-08-29 ENCOUNTER — Encounter (INDEPENDENT_AMBULATORY_CARE_PROVIDER_SITE_OTHER): Payer: Self-pay | Admitting: Adult Health

## 2023-08-29 ENCOUNTER — Ambulatory Visit (INDEPENDENT_AMBULATORY_CARE_PROVIDER_SITE_OTHER): Admitting: Adult Health

## 2023-08-29 ENCOUNTER — Other Ambulatory Visit (HOSPITAL_COMMUNITY): Payer: Self-pay

## 2023-08-29 VITALS — BP 126/85 | HR 96 | Temp 98.0°F | Ht 62.0 in | Wt 259.0 lb

## 2023-08-29 DIAGNOSIS — E66813 Obesity, class 3: Secondary | ICD-10-CM

## 2023-08-29 DIAGNOSIS — E88819 Insulin resistance, unspecified: Secondary | ICD-10-CM

## 2023-08-29 DIAGNOSIS — Z6841 Body Mass Index (BMI) 40.0 and over, adult: Secondary | ICD-10-CM | POA: Diagnosis not present

## 2023-08-29 DIAGNOSIS — E559 Vitamin D deficiency, unspecified: Secondary | ICD-10-CM

## 2023-08-29 DIAGNOSIS — E669 Obesity, unspecified: Secondary | ICD-10-CM | POA: Diagnosis not present

## 2023-08-29 DIAGNOSIS — R09A2 Foreign body sensation, throat: Secondary | ICD-10-CM

## 2023-08-29 DIAGNOSIS — Z9884 Bariatric surgery status: Secondary | ICD-10-CM

## 2023-08-29 MED ORDER — VITAMIN D (ERGOCALCIFEROL) 1.25 MG (50000 UNIT) PO CAPS
50000.0000 [IU] | ORAL_CAPSULE | ORAL | 0 refills | Status: DC
  Filled 2023-08-29: qty 4, 28d supply, fill #0

## 2023-08-29 NOTE — Progress Notes (Signed)
 WEIGHT SUMMARY AND BIOMETRICS  Vitals Temp: 98 F (36.7 C) BP: 126/85 Pulse Rate: 96 SpO2: 96 %   Anthropometric Measurements Height: 5' 2 (1.575 m) Weight: 259 lb (117.5 kg) BMI (Calculated): 47.36 Weight at Last Visit: 271 lb Weight Lost Since Last Visit: 12 lb Weight Gained Since Last Visit: 0 Starting Weight: 244 lb Total Weight Loss (lbs): 0 lb (0 kg)   Body Composition  Body Fat %: 52 % Fat Mass (lbs): 135 lbs Muscle Mass (lbs): 118.4 lbs Total Body Water (lbs): 84.8 lbs Visceral Fat Rating : 17   Other Clinical Data Fasting: no Labs: no Today's Visit #: 31 Starting Date: 09/15/20    Chief Complaint:   OBESITY Debra Horn is here to discuss her progress with her obesity treatment plan.  She is on the S/P Bariatric Surgery Meal Plan- LIQUIDS ONLY and states she is following her eating plan approximately 100 % of the time.  She states she is exercising Walking 15 minutes 7 times per week.  Interim History:    08/21/2023 Laparoscopic Gastric Sleeve Resection with sliding hiatal hernia repair and an EGD   08/21/23  Height 5' 2 (1.575 m)  Weight 275 lb (124.7 kg)  BMI (Calculated) 50.29  Systolic BP 128  Diastolic BP 76  Pulse Rate 55 !     08/29/23 11:00  Height 5' 2 (1.575 m)  Weight 259 lb (117.5 kg)  BMI (Calculated) 47.36    Subjective:   1. Bariatric surgery status 08/21/2023 Preoperative diagnosis: severe obesity   Postoperative diagnosis: Same    Procedure: upper endoscopy    Surgeon: Camellia HERO. Wilson M.D., FACS    Anesthesia: Gen.    Indications for procedure: 38 y.o. year old female undergoing Laparoscopic Gastric Sleeve Resection with sliding hiatal hernia repair and an EGD was requested to evaluate the new gastric sleeve.    Description of procedure: After we have completed the sleeve resection, I scrubbed out and obtained the Olympus endoscope. I gently placed endoscope in the patient's oropharynx and gently glided it down  the esophagus without any difficulty under direct visualization. Once I was in the gastric sleeve, I insufflated the stomach with air. I was able to cannulate and advanced the scope through the gastric sleeve. I was able to cannulate the duodenum with ease. Dr. Signe had placed saline in the upper abdomen. Upon further insufflation of the gastric sleeve there was no evidence of bubbles. GE junction appeared normal.  Upon further inspection of the gastric sleeve, the mucosa appeared normal. There is no evidence of any mucosal abnormality. The sleeve was widely patent at the angularis. There was no evidence of bleeding. The gastric sleeve was decompressed. The scope was withdrawn. The patient tolerated this portion of the procedure well. Please see Dr Donneta operative note for details regarding the laparoscopic gastric sleeve resection.    Camellia HERO. Tanda, MD, FACS  General, Bariatric, & Minimally Invasive Surgery  [facility],  [facility]    2. Insulin  resistance Lab Results  Component Value Date   HGBA1C 5.7 (H) 01/31/2023   HGBA1C 5.2 08/29/2022   HGBA1C 5.5 03/06/2022     Latest Reference Range & Units 01/31/23 11:55  INSULIN  2.6 - 24.9 uIU/mL 28.3 (H)  (H): Data is abnormally high  Debra Horn was started on loading dose Wegovy  0.25mg  on/about 08/29/2022  09/26/2022 Wegovy  increased to 0.5mg  Increased Wegvoy from 0.5mg  to 1mg  on/about 11/02/2022 Due to SE Wegovy  1mg  stopped  late Dec 2024   Started on Metformin  XR 500mg  end of Jan 2025  03/06/2023 Metformin  XR 500mg  increased to 750mg  She choose to stop Metformin  XR May 2025- has remained off  3. Globus sensation Denies mass in neck, dysphagia, dyspepsia, persistent hoarseness, abdominal pain, or N/V/C   4. Vitamin D  deficiency  Latest Reference Range & Units 01/31/23 11:55  Vitamin D , 25-Hydroxy 30.0 - 100.0 ng/mL 22.6 (L)  (L): Data is abnormally low  She has been off Ergocalciferol  twice weekly for  several weeks She has started daily Bariatric MVI  Assessment/Plan:   1. Bariatric surgery status Follow eating plan, recommended supplements, and activity guidelines per BARIATRIC SURGEON  2. Insulin  resistance (Primary) Follow eating plan, recommended supplements, and activity guidelines per BARIATRIC SURGEON  3. Globus sensation Monitor for sx's  4. Vitamin D  deficiency Refill and DECREASE - Vitamin D , Ergocalciferol , (DRISDOL ) 1.25 MG (50000 UNIT) CAPS capsule; Take 1 capsule (50,000 Units total) by mouth every 7 (seven) days.  Dispense: 4 capsule; Refill: 0  5. Obesity, current BMI 47.5  Debra Horn is currently in the action stage of change. As such, her goal is to Follow s/p bariatric eating plan per surgeon   Exercise goals: All adults should avoid inactivity. Some physical activity is better than none, and adults who participate in any amount of physical activity gain some health benefits. Adults should also include muscle-strengthening activities that involve all major muscle groups on 2 or more days a week.  Behavioral modification strategies: increasing lean protein intake, decreasing simple carbohydrates, increasing vegetables, increasing water intake, no skipping meals, meal planning and cooking strategies, keeping healthy foods in the home, ways to avoid boredom eating, and planning for success.  Debra Horn has agreed to follow-up with our clinic in 4 weeks. She was informed of the importance of frequent follow-up visits to maximize her success with intensive lifestyle modifications for her multiple health conditions.   Check Fasting Labs Fall 2025  Objective:   Blood pressure 126/85, pulse 96, temperature 98 F (36.7 C), height 5' 2 (1.575 m), weight 259 lb (117.5 kg), last menstrual period 08/12/2023, SpO2 96%. Body mass index is 47.37 kg/m.  General: Cooperative, alert, well developed, in no acute distress. HEENT: Conjunctivae and lids unremarkable. Cardiovascular:  Regular rhythm.  Lungs: Normal work of breathing. Neurologic: No focal deficits.   Lab Results  Component Value Date   CREATININE 0.62 08/24/2023   BUN 9 08/24/2023   NA 135 08/24/2023   K 3.5 08/24/2023   CL 101 08/24/2023   CO2 23 08/24/2023   Lab Results  Component Value Date   ALT 19 08/22/2023   AST 20 08/22/2023   ALKPHOS 68 08/22/2023   BILITOT 0.6 08/22/2023   Lab Results  Component Value Date   HGBA1C 5.7 (H) 01/31/2023   HGBA1C 5.2 08/29/2022   HGBA1C 5.5 03/06/2022   HGBA1C 5.6 09/21/2021   HGBA1C 5.6 02/04/2021   Lab Results  Component Value Date   INSULIN  28.3 (H) 01/31/2023   INSULIN  9.3 08/29/2022   INSULIN  20.2 03/06/2022   INSULIN  37.1 (H) 09/21/2021   INSULIN  17.6 02/04/2021   Lab Results  Component Value Date   TSH 0.731 01/31/2023   Lab Results  Component Value Date   CHOL 155 09/16/2020   HDL 46 09/16/2020   LDLCALC 97 09/16/2020   TRIG 62 09/16/2020   Lab Results  Component Value Date   VD25OH 22.6 (L) 01/31/2023   VD25OH 28.5 (L) 08/29/2022   VD25OH 36.9  03/06/2022   Lab Results  Component Value Date   WBC 8.0 08/24/2023   HGB 12.7 08/24/2023   HCT 39.2 08/24/2023   MCV 90.1 08/24/2023   PLT 349 08/24/2023   Lab Results  Component Value Date   IRON  31 05/11/2023   TIBC 409 05/11/2023   FERRITIN 16 05/11/2023   Attestation Statements:   Reviewed by clinician on day of visit: allergies, medications, problem list, medical history, surgical history, family history, social history, and previous encounter notes.  I have reviewed the above documentation for accuracy and completeness, and I agree with the above. -  Debra Horn d. Sol Englert, NP-C

## 2023-08-31 ENCOUNTER — Inpatient Hospital Stay: Admitting: Family Medicine

## 2023-09-04 ENCOUNTER — Encounter: Attending: Surgery | Admitting: Skilled Nursing Facility1

## 2023-09-05 ENCOUNTER — Encounter: Payer: Self-pay | Admitting: Skilled Nursing Facility1

## 2023-09-05 ENCOUNTER — Inpatient Hospital Stay: Admitting: Family Medicine

## 2023-09-05 NOTE — Progress Notes (Signed)
 2 Week Post-Operative Nutrition Class   Patient was seen on 09/04/2023 for Post-Operative Nutrition education at the [facility]     Surgery date: 08/21/2023 Surgery type: Sleeve gastrectomy  Start weight at [facility]: 279.9 Weight today: 278.7 Bowel Habits: Every day to every other day no complaints   Body Composition Scale 09/05/2023  Current Body Weight 256.2  Total Body Fat % 47.5  Visceral Fat 16  Fat-Free Mass % 52.4   Total Body Water % 40.7  Muscle-Mass lbs 29.7  BMI 46.7  Body Fat Displacement          Torso  lbs 75.5         Left Leg  lbs 15.1         Right Leg  lbs 15.1         Left Arm  lbs 7.5         Right Arm  lbs 7.5      The following the learning objectives were met by the patient during this course: Identifies Soft Prepped Plan Advancement Guide  Identifies Soft, High Proteins (Phase 1), beginning 2 weeks post-operatively to 3 weeks post-operatively Identifies Additional Soft High Proteins, soft non-starchy vegetables, fruits and starches (Phase 2), beginning 3 weeks post-operatively to 3 months post-operatively Identifies appropriate sources of fluids, proteins, vegetables, fruits and starches Identifies appropriate fat sources and healthy verses unhealthy fat types   States protein, vegetable, fruit and starch recommendations and appropriate sources post-operatively Identifies the need for appropriate texture modifications, mastication, and bite sizes when consuming solids Identifies appropriate fat consumption and sources Identifies appropriate multivitamin and calcium sources post-operatively Describes the need for physical activity post-operatively and will follow MD recommendations States when to call healthcare provider regarding medication questions or post-operative complications   Handouts given during class include: Soft Prepped Plan Advancement Guide   Follow-Up Plan: Patient will follow-up at [facility] in 10 weeks for 3  month post-op nutrition visit for diet advancement per MD.

## 2023-09-06 ENCOUNTER — Telehealth: Admitting: Nurse Practitioner

## 2023-09-06 ENCOUNTER — Encounter: Payer: Self-pay | Admitting: Physician Assistant

## 2023-09-06 ENCOUNTER — Other Ambulatory Visit (HOSPITAL_COMMUNITY): Payer: Self-pay

## 2023-09-06 ENCOUNTER — Inpatient Hospital Stay: Admitting: Family Medicine

## 2023-09-06 DIAGNOSIS — T7840XA Allergy, unspecified, initial encounter: Secondary | ICD-10-CM | POA: Diagnosis not present

## 2023-09-06 MED ORDER — OLOPATADINE HCL 0.2 % OP SOLN
1.0000 [drp] | Freq: Every day | OPHTHALMIC | 0 refills | Status: DC
  Filled 2023-09-06: qty 2.5, 25d supply, fill #0

## 2023-09-06 MED ORDER — EPINEPHRINE 0.3 MG/0.3ML IJ SOAJ
0.3000 mg | INTRAMUSCULAR | 1 refills | Status: AC | PRN
  Filled 2023-09-06: qty 2, 7d supply, fill #0
  Filled 2024-01-02: qty 2, 14d supply, fill #0

## 2023-09-06 MED ORDER — IPRATROPIUM BROMIDE 0.03 % NA SOLN
2.0000 | Freq: Two times a day (BID) | NASAL | 12 refills | Status: DC
  Filled 2023-09-06: qty 30, 30d supply, fill #0

## 2023-09-06 NOTE — Progress Notes (Signed)
 Virtual Visit Consent   Debra Horn, you are scheduled for a virtual visit with a [facility] provider today. Just as with appointments in the office, your consent must be obtained to participate. Your consent will be active for this visit and any virtual visit you may have with one of our providers in the next 365 days. If you have a MyChart account, a copy of this consent can be sent to you electronically.  As this is a virtual visit, video technology does not allow for your provider to perform a traditional examination. This may limit your provider's ability to fully assess your condition. If your provider identifies any concerns that need to be evaluated in person or the need to arrange testing (such as labs, EKG, etc.), we will make arrangements to do so. Although advances in technology are sophisticated, we cannot ensure that it will always work on either your end or our end. If the connection with a video visit is poor, the visit may have to be switched to a telephone visit. With either a video or telephone visit, we are not always able to ensure that we have a secure connection.  By engaging in this virtual visit, you consent to the provision of healthcare and authorize for your insurance to be billed (if applicable) for the services provided during this visit. Depending on your insurance coverage, you may receive a charge related to this service.  I need to obtain your verbal consent now. Are you willing to proceed with your visit today? Demeisha A Gauer has provided verbal consent on 09/06/2023 for a virtual visit (video or telephone). Lauraine Kitty, FNP  Date: 09/06/2023 10:02 AM   Virtual Visit via Video Note   I, Lauraine Kitty, connected with  Debra Horn  ([id number], 17-Oct-1985) on 09/06/23 at 10:00 AM EDT by a video-enabled telemedicine application and verified that I am speaking with the correct person using two identifiers.  Location: Patient: Virtual Visit Location Patient:  Home Provider: Virtual Visit Location Provider: Home Office   I discussed the limitations of evaluation and management by telemedicine and the availability of in person appointments. The patient expressed understanding and agreed to proceed.    History of Present Illness: Debra Horn is a 38 y.o. who identifies as a female who was assigned female at birth, and is being seen today for help with assistance with managing her allergies.   She recently had bariatric surgery and was told she can use benadryl  and switched to zyrtec today   She keeps an Epi pen for past allergic reactions to food and environment. She was getting allergy  immunotherapy prior to surgery but stopped that as part of her per surgical instructions.   Requesting refill on epi pen, management of itchy eyes and runny nose associated with current seasonal allergies   Problems:  Patient Active Problem List   Diagnosis Date Noted   Morbid obesity (HCC) 08/21/2023   Anemia 05/11/2023   Iron  deficiency anemia 05/25/2022   Shortness of breath 08/22/2021   Moderate persistent asthma with exacerbation 07/28/2021   Diffuse pain 05/31/2021   Allergic reaction 05/11/2021   Iron  deficiency anemia due to chronic blood loss 03/07/2021   Low folate 03/07/2021   Class 3 severe obesity with serious comorbidity and body mass index (BMI) of 40.0 to 44.9 in adult 03/03/2021   CRP elevated 12/01/2020   Elevated sed rate 12/01/2020   Nasal drainage 11/09/2020   Insulin  resistance 10/26/2020   Other chest pain  10/13/2020   Severe sleep apnea 06/24/2020   BMI 45.0-49.9, adult (HCC) 06/24/2020   Status post repeat low transverse cesarean section 07/27/2017   Poor fetal growth affecting management of mother in third trimester    Previous pregnancy complicated by pregnancy-induced hypertension in third trimester, antepartum    IUGR (intrauterine growth restriction) in prior pregnancy, pregnant 03/06/2017   Vitamin D  deficiency  02/17/2017   History of preterm delivery 02/09/2017   History of asthma    Normocytic anemia 02/06/2016   Family history of systemic lupus erythematosus (SLE) in mother 02/19/2014   Hyperthyroidism 02/19/2014   Previous cesarean section 08/08/2012   History of pulmonary embolus (PE) 07/24/2012    Allergies:  Allergies  Allergen Reactions   Banana Anaphylaxis   Cefixime Rash   Medications:  Current Outpatient Medications:    albuterol  (VENTOLIN  HFA) 108 (90 Base) MCG/ACT inhaler, Inhale 2 puffs into the lungs every 6 (six) hours as needed for wheezing or shortness of breath., Disp: 8 g, Rfl: 2   budesonide -glycopyrrolate -formoterol  (BREZTRI  AEROSPHERE) 160-9-4.8 MCG/ACT AERO inhaler, Inhale 2 puffs into the lungs in the morning and at bedtime., Disp: 10.7 g, Rfl: 5   enoxaparin  (LOVENOX ) 60 MG/0.6ML injection, Inject 0.6 mLs (60 mg total) into the skin every 12 (twelve) hours., Disp: 36 mL, Rfl: 0   EPINEPHrine  0.3 mg/0.3 mL IJ SOAJ injection, Inject 0.3 mg into the muscle as needed for anaphylaxis., Disp: 1 each, Rfl: 1   fluticasone  (FLOVENT  HFA) 110 MCG/ACT inhaler, Inhale 2 puffs into the lungs in the morning and at bedtime., Disp: 12 g, Rfl: 5   folic acid  (FOLVITE ) 1 MG tablet, Take 1 tablet (1 mg total) by mouth daily., Disp: 30 tablet, Rfl: 2   levocetirizine (XYZAL ) 5 MG tablet, Take 1 tablet (5 mg total) by mouth every evening., Disp: 30 tablet, Rfl: 5   Olopatadine -Mometasone (RYALTRIS ) 665-25 MCG/ACT SUSP, Place 2 sprays into the nose 2 (two) times daily as needed., Disp: 29 g, Rfl: 5   ondansetron  (ZOFRAN -ODT) 4 MG disintegrating tablet, Take 1 tablet (4 mg total) by mouth every 6 (six) hours as needed for nausea or vomiting., Disp: 20 tablet, Rfl: 0   pantoprazole  (PROTONIX ) 40 MG tablet, Take 1 tablet (40 mg total) by mouth daily. Take this medication daily, regardless of reflux symptoms, Disp: 90 tablet, Rfl: 0   traMADol  (ULTRAM ) 50 MG tablet, Take 1 tablet (50 mg total) by  mouth every 6 (six) hours as needed for moderate pain (pain score 4-6) or severe pain (pain score 7-10)., Disp: 5 tablet, Rfl: 0   Vitamin D , Ergocalciferol , (DRISDOL ) 1.25 MG (50000 UNIT) CAPS capsule, Take 1 capsule (50,000 Units total) by mouth every 7 (seven) days., Disp: 4 capsule, Rfl: 0  Observations/Objective: Patient is well-developed, well-nourished in no acute distress.  Resting comfortably  at home.  Head is normocephalic, atraumatic.  No labored breathing.  Speech is clear and coherent with logical content.  Patient is alert and oriented at baseline.    Assessment and Plan:   1. Allergy , initial encounter (Primary) - EPINEPHrine  0.3 mg/0.3 mL IJ SOAJ injection; Inject 0.3 mg into the muscle as needed for anaphylaxis.  Dispense: 1 each; Refill: 1 - ipratropium (ATROVENT ) 0.03 % nasal spray; Place 2 sprays into both nostrils every 12 (twelve) hours.  Dispense: 30 mL; Refill: 12 - Olopatadine  HCl 0.2 % SOLN; Apply 1 drop to eye daily.  Dispense: 3.5 mL; Refill: 0     Follow Up Instructions: I discussed the  assessment and treatment plan with the patient. The patient was provided an opportunity to ask questions and all were answered. The patient agreed with the plan and demonstrated an understanding of the instructions.  A copy of instructions were sent to the patient via MyChart unless otherwise noted below.    The patient was advised to call back or seek an in-person evaluation if the symptoms worsen or if the condition fails to improve as anticipated.    Lauraine Kitty, FNP

## 2023-09-06 NOTE — Progress Notes (Signed)
 Debra Horn.     Thank you for the details you included in the comment boxes. Those details are very helpful in determining the best course of treatment for you and help us  to provide the best care.Because we do not do medication refills via E-visit, we recommend that you schedule a Virtual Urgent Care video visit in order for the provider to better assess what is going on.  The provider will be able to give you a more accurate diagnosis and treatment plan if we can more freely discuss your symptoms and with the addition of a virtual examination.   If you change your visit to a video visit, we will bill your insurance (similar to an office visit) and you will not be charged for this e-Visit. You will be able to stay at home and speak with the first available [facility] advanced practice provider. The link to do a video visit is in the drop down Menu tab of your Welcome screen in MyChart.

## 2023-09-07 ENCOUNTER — Ambulatory Visit: Admitting: Obstetrics and Gynecology

## 2023-09-09 ENCOUNTER — Other Ambulatory Visit (HOSPITAL_COMMUNITY): Payer: Self-pay

## 2023-09-10 ENCOUNTER — Telehealth: Payer: Self-pay | Admitting: Skilled Nursing Facility1

## 2023-09-10 NOTE — Telephone Encounter (Signed)
RD called pt to verify fluid intake once starting soft, solid proteins 2 week post-bariatric surgery.   Daily Fluid intake:  Daily Protein intake: Bowel Habits:   Concerns/issues:    LVM 

## 2023-09-13 ENCOUNTER — Telehealth: Payer: Self-pay | Admitting: Dietician

## 2023-09-13 NOTE — Telephone Encounter (Signed)
 RD called pt to verify fluid intake once starting soft, solid proteins 2 week post-bariatric surgery.   Daily Fluid intake:  Daily Protein intake:  Bowel Habits:   Concerns/issues:   Left Voice Message, with call back number

## 2023-09-18 ENCOUNTER — Other Ambulatory Visit (HOSPITAL_COMMUNITY): Payer: Self-pay

## 2023-09-20 ENCOUNTER — Telehealth

## 2023-09-24 ENCOUNTER — Ambulatory Visit: Admitting: Obstetrics

## 2023-09-24 DIAGNOSIS — G4733 Obstructive sleep apnea (adult) (pediatric): Secondary | ICD-10-CM | POA: Diagnosis not present

## 2023-10-01 ENCOUNTER — Ambulatory Visit (INDEPENDENT_AMBULATORY_CARE_PROVIDER_SITE_OTHER): Admitting: Adult Health

## 2023-10-03 ENCOUNTER — Ambulatory Visit: Admitting: Primary Care

## 2023-10-04 ENCOUNTER — Ambulatory Visit: Admitting: Family Medicine

## 2023-10-09 ENCOUNTER — Ambulatory Visit: Admitting: Obstetrics

## 2023-10-19 ENCOUNTER — Other Ambulatory Visit (HOSPITAL_COMMUNITY): Payer: Self-pay | Admitting: Student

## 2023-10-19 DIAGNOSIS — K912 Postsurgical malabsorption, not elsewhere classified: Secondary | ICD-10-CM | POA: Insufficient documentation

## 2023-10-19 DIAGNOSIS — Z1321 Encounter for screening for nutritional disorder: Secondary | ICD-10-CM | POA: Insufficient documentation

## 2023-10-26 ENCOUNTER — Ambulatory Visit (INDEPENDENT_AMBULATORY_CARE_PROVIDER_SITE_OTHER)

## 2023-10-26 VITALS — BP 134/86 | HR 69 | Temp 98.8°F | Resp 16 | Ht 62.0 in | Wt 246.6 lb

## 2023-10-26 DIAGNOSIS — K912 Postsurgical malabsorption, not elsewhere classified: Secondary | ICD-10-CM

## 2023-10-26 DIAGNOSIS — Z1321 Encounter for screening for nutritional disorder: Secondary | ICD-10-CM

## 2023-10-26 MED ORDER — THIAMINE HCL 100 MG/ML IJ SOLN
100.0000 mg | Freq: Once | INTRAVENOUS | Status: AC
  Administered 2023-10-26: 100 mg via INTRAVENOUS
  Filled 2023-10-26: qty 1

## 2023-10-26 MED ORDER — SODIUM CHLORIDE 0.9 % IV BOLUS
1000.0000 mL | Freq: Once | INTRAVENOUS | Status: AC
  Administered 2023-10-26: 1000 mL via INTRAVENOUS
  Filled 2023-10-26: qty 1000

## 2023-10-26 MED ORDER — SODIUM CHLORIDE 0.9 % IV SOLN
Freq: Once | INTRAVENOUS | Status: AC
  Filled 2023-10-26: qty 1000

## 2023-10-26 NOTE — Progress Notes (Signed)
 Diagnosis: Encounter for special screening examination for nutritional disorder   Provider:  Praveen Mannam MD  Procedure: IV Infusion  IV Type: Peripheral, IV Location: R Antecubital  Normal Saline, Dose: 1000 ml  Infusion Start Time: 0848  Infusion Stop Time: 1005  Post Infusion IV Care: Peripheral IV Discontinued  Discharge: Condition: Good, Destination: Home . AVS Declined  Performed by:  Bruno Leach R Mykeria Garman, LPN     Thiamine, Dose: 100 mg  Infusion Start Time: 0919  Infusion Stop Time: 0949   Banana Bag, Dose: 1000 ml  Infusion Start Time: 1007  Infusion Stop Time: 1116

## 2023-11-07 ENCOUNTER — Ambulatory Visit: Admitting: Primary Care

## 2023-11-07 ENCOUNTER — Encounter: Payer: Self-pay | Admitting: Primary Care

## 2023-11-07 DIAGNOSIS — G4730 Sleep apnea, unspecified: Secondary | ICD-10-CM

## 2023-11-07 DIAGNOSIS — J4541 Moderate persistent asthma with (acute) exacerbation: Secondary | ICD-10-CM

## 2023-11-07 NOTE — Progress Notes (Deleted)
 @Patient  ID: Debra Horn, female    DOB: 1985/02/19, 37 y.o.   MRN: [id number]  No chief complaint on file.   Referring provider: Lendia Boby CROME, NP-C  HPI: 38 year old female, never smoked. PMH significant for asthma, hypothyroidism, pulmonary embolism.  Patient of Dr. Meade.  Previous [facility] pulmonary encounter:  06/24/2020 Patient presents today for 2 month follow-up with PFTs. She was started on Symbicort  and prn albuterol . She states that her shortness of breath was worse after taking Symbicort  so she stopped. Her PFTs were normal today. She reports that smells and smoke with trigger asthma symptoms. She wakes up in the middle of the night feeling as though she can not breath. She has morning headaches. Her home sleep study in May showed severe OSA. We discussed treatment options including weight loss, oral appliance, CPAP or referral to ENT.    11/25/2021 Patient presents today for overdue asthma follow-up.   She was last seen in June 2022. Pulmonary function testing at that time showed normal spirometry without BD response/ FEV1 2.65, ratio 80.   She contacted her PCP on 11/19  for virtual visit d/t asthma exacerbation, she was sent in prednisone  40mg  x 7 days and prn albuterol . She reports wheezing symptoms for approximately 1 week.  She ran out of Flovent  inhaler 3 months ago. She uses Albuterol  1-2 times per day. She completed prednisone  course which helped. Denies cough or nasal congestion.   She is 100% compliant with CPAP use. She feels CPAP pressure is no longer strong enough. She brought her machine by DME company and everything looked fine. She replaced her mask and supplies. She is sleeping alright at night without significant residual daytime sleepiness.  Airview download 10/24/21-11/22/21 30/30 days used; 97% > 4 hours Average usage 8 hours 5 mins Pressure 5-20cm h20 Airleaks 33.3L/min (95%) AHI 3.0     11/07/2023- Interim hx  Discussed the use of AI scribe  software for clinical note transcription with the patient, who gave verbal consent to proceed.  History of Present Illness   1 year follow-up asthma and sleep apnea Maintained Flovent  and Albuterol          Pulmonary testing: 06/24/2020 PFTs- FVC 3.30 (108%), FEV1 2.65 (103%), ratio 80, TLC 112%, DLCOunc 97% Normal Spirometry without BD response. Normal diffusion capacity   06/24/2020 FENO - 36    Allergies  Allergen Reactions   Banana Anaphylaxis   Cefixime Rash    Immunization History  Administered Date(s) Administered   Influenza-Unspecified 01/16/2018, 11/25/2018   PFIZER Comirnaty(Gray Top)Covid-19 Tri-Sucrose Vaccine 08/30/2019, 09/27/2019   Tdap 08/08/2012, 07/29/2017    Past Medical History:  Diagnosis Date   Anemia    low iron    Asthma    Complication of anesthesia    bp dropped in middle of last c-section and started with n/v   GERD (gastroesophageal reflux disease)    during pregnancy   Headache    couple days/week (10/28/2015)   Hyperthyroidism    not on medication   PE (pulmonary embolism) 11/2011   PONV (postoperative nausea and vomiting)    Pregnancy induced hypertension    Preterm labor    Sleep apnea    SOB (shortness of breath)    Thyroid  condition    Vitamin D  deficiency     Tobacco History: Social History   Tobacco Use  Smoking Status Never  Smokeless Tobacco Never   Counseling given: Not Answered   Outpatient Medications Prior to Visit  Medication Sig Dispense  Refill   albuterol  (VENTOLIN  HFA) 108 (90 Base) MCG/ACT inhaler Inhale 2 puffs into the lungs every 6 (six) hours as needed for wheezing or shortness of breath. 8 g 2   budesonide -glycopyrrolate -formoterol  (BREZTRI  AEROSPHERE) 160-9-4.8 MCG/ACT AERO inhaler Inhale 2 puffs into the lungs in the morning and at bedtime. 10.7 g 5   enoxaparin  (LOVENOX ) 60 MG/0.6ML injection Inject 0.6 mLs (60 mg total) into the skin every 12 (twelve) hours. 36 mL 0   EPINEPHrine  0.3 mg/0.3  mL IJ SOAJ injection Inject 0.3 mg into the muscle as needed for anaphylaxis. 2 each 1   fluticasone  (FLOVENT  HFA) 110 MCG/ACT inhaler Inhale 2 puffs into the lungs in the morning and at bedtime. 12 g 5   folic acid  (FOLVITE ) 1 MG tablet Take 1 tablet (1 mg total) by mouth daily. 30 tablet 2   ipratropium (ATROVENT ) 0.03 % nasal spray Place 2 sprays into both nostrils every 12 (twelve) hours. 30 mL 12   levocetirizine (XYZAL ) 5 MG tablet Take 1 tablet (5 mg total) by mouth every evening. 30 tablet 5   Olopatadine  HCl 0.2 % SOLN Apply 1 drop to affected eyes once daily. 2.5 mL 0   Olopatadine -Mometasone (RYALTRIS ) 665-25 MCG/ACT SUSP Place 2 sprays into the nose 2 (two) times daily as needed. 29 g 5   ondansetron  (ZOFRAN -ODT) 4 MG disintegrating tablet Take 1 tablet (4 mg total) by mouth every 6 (six) hours as needed for nausea or vomiting. 20 tablet 0   pantoprazole  (PROTONIX ) 40 MG tablet Take 1 tablet (40 mg total) by mouth daily. Take this medication daily, regardless of reflux symptoms 90 tablet 0   traMADol  (ULTRAM ) 50 MG tablet Take 1 tablet (50 mg total) by mouth every 6 (six) hours as needed for moderate pain (pain score 4-6) or severe pain (pain score 7-10). 5 tablet 0   Vitamin D , Ergocalciferol , (DRISDOL ) 1.25 MG (50000 UNIT) CAPS capsule Take 1 capsule (50,000 Units total) by mouth every 7 (seven) days. 4 capsule 0   No facility-administered medications prior to visit.      Review of Systems  Review of Systems   Physical Exam  There were no vitals taken for this visit. Physical Exam  ***  Lab Results:  CBC    Component Value Date/Time   WBC 8.0 08/24/2023 2057   RBC 4.35 08/24/2023 2057   HGB 12.7 08/24/2023 2057   HGB 12.0 05/11/2023 0819   HGB 11.3 02/09/2017 1233   HCT 39.2 08/24/2023 2057   HCT 34.5 02/09/2017 1233   PLT 349 08/24/2023 2057   PLT 219 05/11/2023 0819   PLT 378 02/09/2017 1233   MCV 90.1 08/24/2023 2057   MCV 85 02/09/2017 1233   MCH 29.2  08/24/2023 2057   MCHC 32.4 08/24/2023 2057   RDW 13.8 08/24/2023 2057   RDW 16.3 (H) 02/09/2017 1233   LYMPHSABS 2.4 08/14/2023 1000   LYMPHSABS 2.8 02/09/2017 1233   MONOABS 0.7 08/14/2023 1000   EOSABS 0.1 08/14/2023 1000   EOSABS 0.5 (H) 02/09/2017 1233   BASOSABS 0.0 08/14/2023 1000   BASOSABS 0.0 02/09/2017 1233    BMET    Component Value Date/Time   NA 135 08/24/2023 2057   NA 137 01/31/2023 1155   K 3.5 08/24/2023 2057   CL 101 08/24/2023 2057   CO2 23 08/24/2023 2057   GLUCOSE 77 08/24/2023 2057   BUN 9 08/24/2023 2057   BUN 11 01/31/2023 1155   CREATININE 0.62 08/24/2023 2057  CREATININE 0.76 03/07/2021 1130   CALCIUM 9.3 08/24/2023 2057   GFRNONAA >60 08/24/2023 2057   GFRNONAA >60 03/07/2021 1130   GFRAA >60 07/31/2017 1441    BNP No results found for: BNP  ProBNP    Component Value Date/Time   PROBNP 14.0 08/18/2021 1020    Imaging: No results found.   Assessment & Plan:   No problem-specific Assessment & Plan notes found for this encounter.   1. Severe sleep apnea (Primary)  2. Moderate persistent asthma with exacerbation   Assessment and Plan Assessment & Plan       I personally spent a total of *** minutes in the care of the patient today including {Time Based Coding:210964241}.   Almarie LELON Ferrari, NP 11/07/2023

## 2023-11-20 ENCOUNTER — Ambulatory Visit: Admitting: Dietician

## 2023-11-24 ENCOUNTER — Telehealth: Admitting: Physician Assistant

## 2023-11-24 DIAGNOSIS — J45901 Unspecified asthma with (acute) exacerbation: Secondary | ICD-10-CM

## 2023-11-24 NOTE — Progress Notes (Signed)
  Because of your symptoms and surgical history, I feel your condition warrants further evaluation and I recommend that you be seen in a face-to-face visit.   NOTE: There will be NO CHARGE for this E-Visit   If you are having a true medical emergency, please call 911.     For an urgent face to face visit, [place] has multiple urgent care centers for your convenience.  Click the link below for the full list of locations and hours, walk-in wait times, appointment scheduling options and driving directions:  Urgent Care - [place], [place], [place], [place], [place], [place]  [place]     Your MyChart E-visit questionnaire answers were reviewed by a board certified advanced clinical practitioner to complete your personal care plan based on your specific symptoms.    Thank you for using e-Visits.

## 2023-11-25 ENCOUNTER — Telehealth: Admitting: Physician Assistant

## 2023-11-25 DIAGNOSIS — J4540 Moderate persistent asthma, uncomplicated: Secondary | ICD-10-CM

## 2023-11-25 MED ORDER — ALBUTEROL SULFATE HFA 108 (90 BASE) MCG/ACT IN AERS: 2.0000 | INHALATION_SPRAY | Freq: Four times a day (QID) | RESPIRATORY_TRACT | 0 refills | Status: AC | PRN

## 2023-11-25 NOTE — Progress Notes (Signed)
 E Visit for Asthma  Based on what you have shared with me, it looks like you may have a flare up of your asthma.  Asthma is a chronic (ongoing) lung disease which results in airway obstruction, inflammation and hyper-responsiveness.   Asthma symptoms vary from person to person, with common symptoms including nighttime awakening and decreased ability to participate in normal activities due to shortness of breath. It is often triggered by changes in weather, changes in the season, changes in air temperature, or inside (home, school, daycare or work) allergens such as animal dander, mold, mildew, woodstoves or cockroaches.   It can also be triggered by hormonal changes, extreme emotion, physical exertion or an upper respiratory tract illness.     It is important to identify the trigger and eliminate or avoid the trigger if possible.   If you have been prescribed medications to be taken on a regular basis, it is important to follow the asthma action plan and to follow guidelines to adjust medication in response to increasing symptoms of decreased peak expiratory flow rate.  Treatment: I have prescribed: Albuterol  (Proventil  HFA; Ventolin  HFA) 108 (90 Base) MCG/ACT Inhaler 2 puffs into the lungs every six hours as needed for wheezing or shortness of breath  HOME CARE Only take medications as instructed by your medical team. Consider wearing a mask or scarf to improve breathing when there is poor air quality as this has been shown to decrease irritation and decrease exacerbations Get rest. Using a humidifier may help nasal congestion and ease sore throat pain. Using a saline nasal spray works much the same way.  Cough drops, hard candies and sore throat lozenges may ease your cough.  Avoid close contacts, especially the very you and the elderly. Cover your mouth if you cough or  sneeze. Always remember to wash your hands.   GET HELP RIGHT AWAY IF: You develop worsening shortness of breath/difficulty breathing or chest tightness, breathlessness at rest, drowsy, confused or agitated, unable to speak in full sentences, or if you develop chest pain.  You have coughing fits. You develop a severe headache or visual changes. You develop shortness of breath, difficulty breathing or start having chest pain. Your symptoms persist after you have completed your treatment plan. If your symptoms do not improve within 5 days.  MAKE SURE YOU Understand these instructions. Will watch your condition. Will get help right away if you are not doing well or get worse.   Your e-visit answers were reviewed by a board certified advanced clinical practitioner to complete your personal care plan, Depending upon the condition, your plan could have included both over the counter or prescription medications.  Please review your pharmacy choice. Your safety is important to us . If you have drug allergies check your prescription carefully. You can use MyChart to ask questions about today's visit, request a non-urgent call back, or ask for a work or school excuse for 24 hours related to this e-Visit. If it has been greater than 24 hours you will need to follow up with your provider, or enter a new e-Visit to address those concerns.  You will get an e-mail in the next two days asking about your experience. I hope that your e-visit has been valuable and will speed your recovery. Thank you for using e-visits.  I have spent 5 minutes in review of e-visit questionnaire, review and updating patient chart, medical decision making and response to patient.   Delon CHRISTELLA Dickinson, PA-C

## 2023-11-28 ENCOUNTER — Telehealth: Payer: Self-pay

## 2023-11-28 NOTE — Telephone Encounter (Signed)
 Attempted to call patient about continuing allergy  injections. Unable to leave voicemail due to voicemail not being set up.

## 2023-12-04 ENCOUNTER — Encounter: Admitting: Dietician

## 2023-12-13 ENCOUNTER — Encounter: Payer: Self-pay | Admitting: Neurology

## 2023-12-31 ENCOUNTER — Encounter: Payer: Self-pay | Admitting: Emergency Medicine

## 2023-12-31 ENCOUNTER — Ambulatory Visit
Admission: EM | Admit: 2023-12-31 | Discharge: 2023-12-31 | Disposition: A | Discharge status: Home / Self Care | Attending: Family Medicine | Admitting: Family Medicine

## 2023-12-31 DIAGNOSIS — N926 Irregular menstruation, unspecified: Secondary | ICD-10-CM

## 2023-12-31 DIAGNOSIS — R202 Paresthesia of skin: Secondary | ICD-10-CM | POA: Diagnosis not present

## 2023-12-31 DIAGNOSIS — T754XXA Electrocution, initial encounter: Secondary | ICD-10-CM | POA: Diagnosis not present

## 2023-12-31 LAB — POCT URINE DIPSTICK
Glucose, UA: NEGATIVE mg/dL
Leukocytes, UA: NEGATIVE
Nitrite, UA: NEGATIVE
POC PROTEIN,UA: 30 — AB
Spec Grav, UA: >=1.03 — AB
Urobilinogen, UA: 0.2 U/dL
pH, UA: 5.5

## 2023-12-31 LAB — POCT URINE PREGNANCY: Preg Test, Ur: NEGATIVE

## 2023-12-31 MED ORDER — LORAZEPAM 0.5 MG PO TABS: 0.5000 mg | ORAL_TABLET | Freq: Every evening | ORAL | 0 refills | Status: AC | PRN

## 2023-12-31 MED ORDER — IBUPROFEN 600 MG PO TABS: 600.0000 mg | ORAL_TABLET | Freq: Three times a day (TID) | ORAL | 0 refills | Status: AC | PRN

## 2023-12-31 NOTE — ED Triage Notes (Signed)
 Pt reports electric shock from plugging in her phone to outlet last night and felt tingling in her L hand that spread all over her body. Pt reports the tingling sensation is now just in the soles of her feet. No burn sites on L hand, but she felt the electrical shock sensation when plugging up the phone with that hand. No med use for symptoms.

## 2023-12-31 NOTE — Discharge Instructions (Signed)
 Your EKG was normal  The urinalysis did not show significant amount of blood.  The pregnancy test was negative
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
## 2024-01-02 ENCOUNTER — Other Ambulatory Visit: Payer: Self-pay | Admitting: Allergy & Immunology

## 2024-01-02 ENCOUNTER — Other Ambulatory Visit (HOSPITAL_COMMUNITY): Payer: Self-pay

## 2024-01-02 ENCOUNTER — Other Ambulatory Visit: Payer: Self-pay

## 2024-01-02 MED ORDER — ALBUTEROL SULFATE HFA 108 (90 BASE) MCG/ACT IN AERS
2.0000 | INHALATION_SPRAY | Freq: Four times a day (QID) | RESPIRATORY_TRACT | 1 refills | Status: AC | PRN
  Filled 2024-01-02: qty 6.7, 17d supply, fill #0
  Filled 2024-02-08: qty 6.7, 17d supply, fill #1

## 2024-01-14 ENCOUNTER — Encounter (INDEPENDENT_AMBULATORY_CARE_PROVIDER_SITE_OTHER): Payer: Self-pay | Admitting: Adult Health

## 2024-01-14 ENCOUNTER — Ambulatory Visit (INDEPENDENT_AMBULATORY_CARE_PROVIDER_SITE_OTHER): Admitting: Adult Health

## 2024-01-14 VITALS — BP 124/80 | HR 76 | Temp 97.8°F | Ht 62.0 in | Wt 228.0 lb

## 2024-01-14 DIAGNOSIS — F32A Depression, unspecified: Secondary | ICD-10-CM

## 2024-01-14 DIAGNOSIS — E669 Obesity, unspecified: Secondary | ICD-10-CM

## 2024-01-14 DIAGNOSIS — E559 Vitamin D deficiency, unspecified: Secondary | ICD-10-CM

## 2024-01-14 DIAGNOSIS — Z6841 Body Mass Index (BMI) 40.0 and over, adult: Secondary | ICD-10-CM | POA: Diagnosis not present

## 2024-01-14 DIAGNOSIS — F419 Anxiety disorder, unspecified: Secondary | ICD-10-CM | POA: Diagnosis not present

## 2024-01-14 DIAGNOSIS — Z9884 Bariatric surgery status: Secondary | ICD-10-CM | POA: Diagnosis not present

## 2024-01-14 MED ORDER — FLUOXETINE HCL 10 MG PO CAPS: 10.0000 mg | ORAL_CAPSULE | Freq: Every day | ORAL | 0 refills | Status: AC

## 2024-01-14 NOTE — Progress Notes (Unsigned)
 "    WEIGHT SUMMARY AND BIOMETRICS  Vitals Temp: 97.8 F (36.6 C) BP: 124/80 Pulse Rate: 76 SpO2: 99 %   Anthropometric Measurements Height: 5' 2 (1.575 m) Weight: 228 lb (103.4 kg) BMI (Calculated): 41.69 Weight at Last Visit: 259lb Weight Lost Since Last Visit: 31lb Weight Gained Since Last Visit: 0lb Starting Weight: 244lb Total Weight Loss (lbs): 16 lb (7.258 kg) Peak Weight: 273lb Waist Measurement : 47 inches   Body Composition  Body Fat %: 49.9 % Fat Mass (lbs): 113.8 lbs Muscle Mass (lbs): 108.4 lbs Total Body Water (lbs): 79.6 lbs Visceral Fat Rating : 14   Other Clinical Data RMR: [id number] Fasting: No Labs: no Today's Visit #: 32 Starting Date: 09/15/20    Chief Complaint:   OBESITY Debra Horn is here to discuss her progress with her obesity treatment plan.  She is on the the Category 2 Plan and states she is following her eating plan approximately 75 % of the time.  She states she is exercising Walking 30 minutes 4 times per week.   Interim History:  08/21/2023 Laparoscopic Sleeve Gastrectomy, upper endoscopy 08/29/2023 [facility] OV 09/13/2023 F/u Bariatric Surgeon 10/19/2023 F/u Bariatric Surgeon- okay to restart SSRI 11/24/2023 and 11/25/2023 Virtual Visits for worsening Asthma 12/31/23 UC Visit for bilateral paresthesias   Due to lack of f/u at [facility], she has off Fluoxetine  10mg  for months. She reports increased anxiety, lack of motivation, and worsening fatigue the last 3 months.  She has been working overtime, up to 100-120 hrs every two weeks. She reports that her home life is stable- husband and 3 children.  Exercise-walking, often prior to work shift  Debra Horn provided the following food recall that is typical of a day: Breakfast: Skips Lunch: Small serving of protein (ie: chicken, fish, ground beef) with grilled vegetables Snack: Protein shake Dinner: Protein shake or small serving of a leftover meal from the family.  Discussed at length  the importance of adequate calorie and protein daily.  Reviewed Bioimpedance Results with pt: Muscle Mass: -10 lbs Adipose Mass: -21.2 lbs  Subjective:   1. Bariatric surgery status Surgical Procedure: Laparoscopic Sleeve Gastrectomy, upper endoscopy   Discharge Condition: Good Disposition: Home   Diet recommendation: Postoperative sleeve gastrectomy diet (liquids only)       Filed Weights    08/21/23 0538 08/21/23 0607  Weight: 124.7 kg 124.7 kg        Hospital Course:  The patient was admitted for a planned laparoscopic sleeve gastrectomy. Please see operative note. Preoperatively the patient was given 5000 units of subcutaneous heparin  for DVT prophylaxis. Postoperative prophylactic heparin  dosing was started on the evening of postoperative day 0. ERAS protocol was used. On the evening of postoperative day 0, the patient was started on water and ice chips. On postoperative day 1 the patient had no fever or tachycardia and was tolerating water in their diet was gradually advanced throughout the day. The patient was ambulating without difficulty. Their vital signs are stable without fever or tachycardia. Their hemoglobin had remained stable. The patient was maintained on their home settings for CPAP therapy. She did not complete protein goals and was not mobilizing enough on POD 1 to go home, but this improved by POD 2. The patient had received discharge instructions and counseling. They were deemed stable for discharge and had met discharge criteria    2. Vitamin D  deficiency She endorses chronic fatigue She has been off Ergocalciferol  for months- lost to f/u She is on daily  Bariatric MVI  3. Anxiety and Depression She denies SI/HI She was restarted on Fluoxetine  10mg  at [facility] OV in August- then unable to continue as she was lost to f/u Bariatric surgeon confirmed safe to restart  Assessment/Plan:   1. Bariatric surgery status (Primary) Increase FOOD protein F/u with  Bariatric Nutritionist for guidance  2. Vitamin D  deficiency Continue Bariatric MVI Monitor Labs  3. Anxiety and Depression Restart FLUoxetine  (PROZAC ) 10 MG capsule Take 1 capsule (10 mg total) by mouth daily. Dispense: 30 capsule, Refills: 0 ordered   Referral- Psychology  Increase regular walking, if able reduce hours at work  4. Obesity, current BMI 41.7  Debra Horn is currently in the action stage of change. As such, her goal is to continue with weight loss efforts. She has agreed to the Category 2 Plan.   Exercise goals: For substantial health benefits, adults should do at least 150 minutes (2 hours and 30 minutes) a week of moderate-intensity, or 75 minutes (1 hour and 15 minutes) a week of vigorous-intensity aerobic physical activity, or an equivalent combination of moderate- and vigorous-intensity aerobic activity. Aerobic activity should be performed in episodes of at least 10 minutes, and preferably, it should be spread throughout the week.  Behavioral modification strategies: increasing lean protein intake, decreasing simple carbohydrates, increasing vegetables, increasing water intake, decreasing eating out, no skipping meals, meal planning and cooking strategies, keeping healthy foods in the home, ways to avoid boredom eating, and planning for success.  Debra Horn has agreed to follow-up with our clinic in 4 weeks. She was informed of the importance of frequent follow-up visits to maximize her success with intensive lifestyle modifications for her multiple health conditions.   Objective:   Blood pressure 124/80, pulse 76, temperature 97.8 F (36.6 C), height 5' 2 (1.575 m), weight 228 lb (103.4 kg), last menstrual period 12/02/2023, SpO2 99%. Body mass index is 41.7 kg/m.  General: Cooperative, alert, well developed, in no acute distress. HEENT: Conjunctivae and lids unremarkable. Cardiovascular: Regular rhythm.  Lungs: Normal work of breathing. Neurologic: No focal  deficits.   Lab Results  Component Value Date   CREATININE 0.62 08/24/2023   BUN 9 08/24/2023   NA 135 08/24/2023   K 3.5 08/24/2023   CL 101 08/24/2023   CO2 23 08/24/2023   Lab Results  Component Value Date   ALT 19 08/22/2023   AST 20 08/22/2023   ALKPHOS 68 08/22/2023   BILITOT 0.6 08/22/2023   Lab Results  Component Value Date   HGBA1C 5.7 (H) 01/31/2023   HGBA1C 5.2 08/29/2022   HGBA1C 5.5 03/06/2022   HGBA1C 5.6 09/21/2021   HGBA1C 5.6 02/04/2021   Lab Results  Component Value Date   INSULIN  28.3 (H) 01/31/2023   INSULIN  9.3 08/29/2022   INSULIN  20.2 03/06/2022   INSULIN  37.1 (H) 09/21/2021   INSULIN  17.6 02/04/2021   Lab Results  Component Value Date   TSH 0.731 01/31/2023   Lab Results  Component Value Date   CHOL 155 09/16/2020   HDL 46 09/16/2020   LDLCALC 97 09/16/2020   TRIG 62 09/16/2020   Lab Results  Component Value Date   VD25OH 22.6 (L) 01/31/2023   VD25OH 28.5 (L) 08/29/2022   VD25OH 36.9 03/06/2022   Lab Results  Component Value Date   WBC 8.0 08/24/2023   HGB 12.7 08/24/2023   HCT 39.2 08/24/2023   MCV 90.1 08/24/2023   PLT 349 08/24/2023   Lab Results  Component Value Date   IRON  31  05/11/2023   TIBC 409 05/11/2023   FERRITIN 16 05/11/2023   Attestation Statements:   Reviewed by clinician on day of visit: allergies, medications, problem list, medical history, surgical history, family history, social history, and previous encounter notes.  I have reviewed the above documentation for accuracy and completeness, and I agree with the above. -  Janet Decesare d. Shaima Sardinas, NP-C "

## 2024-01-22 ENCOUNTER — Other Ambulatory Visit: Payer: Self-pay

## 2024-01-22 ENCOUNTER — Other Ambulatory Visit (HOSPITAL_COMMUNITY): Payer: Self-pay

## 2024-01-22 ENCOUNTER — Encounter: Payer: Self-pay | Admitting: Allergy & Immunology

## 2024-01-22 ENCOUNTER — Ambulatory Visit: Admitting: Allergy & Immunology

## 2024-01-22 ENCOUNTER — Telehealth: Payer: Self-pay | Admitting: Allergy & Immunology

## 2024-01-22 VITALS — BP 120/78 | HR 85 | Temp 98.1°F | Resp 18 | Ht 62.0 in | Wt 232.1 lb

## 2024-01-22 DIAGNOSIS — K219 Gastro-esophageal reflux disease without esophagitis: Secondary | ICD-10-CM

## 2024-01-22 DIAGNOSIS — J302 Other seasonal allergic rhinitis: Secondary | ICD-10-CM

## 2024-01-22 DIAGNOSIS — J3089 Other allergic rhinitis: Secondary | ICD-10-CM

## 2024-01-22 DIAGNOSIS — J4541 Moderate persistent asthma with (acute) exacerbation: Secondary | ICD-10-CM | POA: Diagnosis not present

## 2024-01-22 MED ORDER — BREZTRI AEROSPHERE 160-9-4.8 MCG/ACT IN AERO
2.0000 | INHALATION_SPRAY | Freq: Two times a day (BID) | RESPIRATORY_TRACT | 5 refills | Status: AC
  Filled 2024-01-22 – 2024-02-08 (×2): qty 10.7, 30d supply, fill #0

## 2024-01-22 MED ORDER — RYALTRIS 665-25 MCG/ACT NA SUSP
2.0000 | Freq: Two times a day (BID) | NASAL | 5 refills | Status: DC | PRN
  Filled 2024-01-22: qty 29, fill #0

## 2024-01-22 MED ORDER — PANTOPRAZOLE SODIUM 40 MG PO TBEC
40.0000 mg | DELAYED_RELEASE_TABLET | Freq: Two times a day (BID) | ORAL | 3 refills | Status: AC
  Filled 2024-01-22: qty 180, 90d supply, fill #0

## 2024-01-22 MED ORDER — RYALTRIS 665-25 MCG/ACT NA SUSP: 2.0000 | Freq: Two times a day (BID) | NASAL | 5 refills | Status: AC | PRN

## 2024-01-22 MED ORDER — LEVOCETIRIZINE DIHYDROCHLORIDE 5 MG PO TABS
5.0000 mg | ORAL_TABLET | Freq: Every evening | ORAL | 3 refills | Status: AC
  Filled 2024-01-22: qty 30, 30d supply, fill #0

## 2024-01-22 NOTE — Patient Instructions (Addendum)
 1. Gastroesophageal reflux disease - Continue with Protonix  40mg  once daily. - I will check on the Pepcid  and bariatric surgery.   2. Mild persistent asthma, uncomplicated - Lung testing looks awesome today. - Daily controller medication(s): Breztri  160/9/4.74mcg two puffs one to two times daily during certain times of the year - Prior to physical activity: albuterol  2 puffs 10-15 minutes before physical activity. - Rescue medications: albuterol  4 puffs every 4-6 hours as needed - Asthma control goals:  * Full participation in all desired activities (may need albuterol  before activity) * Albuterol  use two time or less a week on average (not counting use with activity) * Cough interfering with sleep two time or less a month * Oral steroids no more than once a year * No hospitalizations  3. Chronic rhinitis (grasses, ragweed, weeds, trees, indoor molds, outdoor molds, dust mites, and cat) - Make an appointment to restart the allergy  shots.  - Continue taking: Xyzal  (levocetirizine) 5mg  tablet once daily and Ryaltris  (olopatadine /mometasone ) two sprays per nostril 1-2 times daily as needed4 - You can use an extra dose of the antihistamine, if needed, for breakthrough symptoms.  - EpiPen  is up to date.   5. Return in about 6 months (around 07/21/2024). You can have the follow up appointment with Dr. Iva or a Nurse Practicioner (our Nurse Practitioners are excellent and always have Physician oversight!).    Please inform us  of any Emergency Department visits, hospitalizations, or changes in symptoms. Call us  before going to the ED for breathing or allergy  symptoms since we might be able to fit you in for a sick visit. Feel free to contact us  anytime with any questions, problems, or concerns.  It was a pleasure to see you again today!  Websites that have reliable patient information: 1. American Academy of Asthma, Allergy , and Immunology: www.aaaai.org 2. Food Allergy  Research and  Education (FARE): foodallergy.org 3. Mothers of Asthmatics: http://www.asthmacommunitynetwork.org 4. [employer] of Allergy , Asthma, and Immunology: www.acaai.org      Like us  on [employer] and Instagram for our latest updates!      A healthy democracy works best when [employer] participate! Make sure you are registered to vote! If you have moved or changed any of your contact information, you will need to get this updated before voting! Scan the QR codes below to learn more!

## 2024-01-22 NOTE — Telephone Encounter (Signed)
 E[place] [employer] AIT on 02/12/24 at 8:30AM

## 2024-01-22 NOTE — Progress Notes (Signed)
 "  FOLLOW UP  Date of Service/Encounter:  01/22/24   Assessment:   Allergic reaction - GERD versus true allergic reaction (negative testing to the most common foods)   Gastroesophageal reflux disease - adding on H2 occur in addition to PPI   Mild persistent asthma, uncomplicated - with AEC ranging from 100-300   Perennial and seasonal allergic rhinitis (grasses, ragweed, weeds, trees, indoor molds, outdoor molds, dust mites, and cat)  Plan/Recommendations:   1. Gastroesophageal reflux disease - Continue with Protonix  40mg  once daily. - I will check on the Pepcid  and bariatric surgery.   2. Mild persistent asthma, uncomplicated - Lung testing looks awesome today. - Daily controller medication(s): Breztri  160/9/4.36mcg two puffs one to two times daily during certain times of the year - Prior to physical activity: albuterol  2 puffs 10-15 minutes before physical activity. - Rescue medications: albuterol  4 puffs every 4-6 hours as needed - Asthma control goals:  * Full participation in all desired activities (may need albuterol  before activity) * Albuterol  use two time or less a week on average (not counting use with activity) * Cough interfering with sleep two time or less a month * Oral steroids no more than once a year * No hospitalizations  3. Chronic rhinitis (grasses, ragweed, weeds, trees, indoor molds, outdoor molds, dust mites, and cat) - Make an appointment to restart the allergy  shots.  - Continue taking: Xyzal  (levocetirizine) 5mg  tablet once daily and Ryaltris  (olopatadine /mometasone ) two sprays per nostril 1-2 times daily as needed4 - You can use an extra dose of the antihistamine, if needed, for breakthrough symptoms.  - EpiPen  is up to date.   5. Return in about 6 months (around 07/21/2024). You can have the follow up appointment with Dr. Iva or a Nurse Practicioner (our Nurse Practitioners are excellent and always have Physician oversight!).    Subjective:    Debra Horn is a 39 y.o. female presenting today for follow up of  Chief Complaint  Patient presents with   Follow-up    Debra Horn has a history of the following: Patient Active Problem List   Diagnosis Date Noted   Malnutrition following gastrointestinal surgery 10/19/2023   Encounter for special screening examination for nutritional disorder 10/19/2023   Morbid obesity (HCC) 08/21/2023   Anemia 05/11/2023   Iron  deficiency anemia 05/25/2022   Shortness of breath 08/22/2021   Moderate persistent asthma with exacerbation 07/28/2021   Diffuse pain 05/31/2021   Allergic reaction 05/11/2021   Iron  deficiency anemia due to chronic blood loss 03/07/2021   Low folate 03/07/2021   Class 3 severe obesity with serious comorbidity and body mass index (BMI) of 40.0 to 44.9 in adult (HCC) 03/03/2021   CRP elevated 12/01/2020   Elevated sed rate 12/01/2020   Nasal drainage 11/09/2020   Insulin  resistance 10/26/2020   Other chest pain 10/13/2020   Severe sleep apnea 06/24/2020   BMI 45.0-49.9, adult (HCC) 06/24/2020   Status post repeat low transverse cesarean section 07/27/2017   Poor fetal growth affecting management of mother in third trimester    Previous pregnancy complicated by pregnancy-induced hypertension in third trimester, antepartum    IUGR (intrauterine growth restriction) in prior pregnancy, pregnant 03/06/2017   Vitamin D  deficiency 02/17/2017   History of preterm delivery 02/09/2017   History of asthma    Normocytic anemia 02/06/2016   Family history of systemic lupus erythematosus (SLE) in mother 02/19/2014   Hyperthyroidism 02/19/2014   Previous cesarean section 08/08/2012   History of  pulmonary embolus (PE) 07/24/2012    History obtained from: chart review and patient.  Discussed the use of AI scribe software for clinical note transcription with the patient and/or guardian, who gave verbal consent to proceed.  Debra Horn is a 39 y.o. female presenting  for a follow up visit.  She was last seen in April 2025.  At that time, we added on Pepcid  40 mg daily to work with her Protonix  for her GERD.  For her asthma, lung testing looked awesome.  We started her on a prednisone  taper.  We recommended continuing with the Breztri  2 puffs twice daily every day.  For her rhinitis, we continue with Xyzal  as well as Ryaltris .  Since last visit, she has done well.  Asthma/Respiratory Symptom History: Her breathing has improved, and she does not need to use her inhaler as frequently. She previously used Breztri  but did not tolerate it well and now uses it only seasonally. She finds the taste and sensation of the powder unpleasant.  She is not using Breztri  and routinely just uses it as needed.  Allergic Rhinitis Symptom History: She stopped her allergy  shots prior to her surgery but is now in the process of resuming them. The nasal spray Royaltras is very effective and essential for her daily routine. She does not currently use an antihistamine like Xyzal .  She remains interested in restarting them. She was on allergy  shots, but stopped getting them in May 2025.  She did feel like they were starting to help.   GERD Symptom History: She cannot take Pepcid  now because of the gastric sleeve surgery. At least this is what she was told by her bariatric surgery team. She takes the Protonix  daily regularly. Five months post-gastric sleeve surgery, she has been unable to take famotidine . She continues on pantoprazole  (Protonix ) for maintenance. Reflux symptoms occur infrequently, with a notable episode last Monday when she woke up choking due to acid reflux, possibly due to a missed Protonix  dose.  She is a [facility] at the Urgent Care for [facility].   Otherwise, there have been no changes to her past medical history, surgical history, family history, or social history.    Review of systems otherwise negative other than that mentioned in the HPI.    Objective:    Blood pressure 120/78, pulse 85, temperature 98.1 F (36.7 C), temperature source Temporal, resp. rate 18, height 5' 2 (1.575 m), weight 232 lb 1.6 oz (105.3 kg), last menstrual period 12/02/2023, SpO2 98%. Body mass index is 42.45 kg/m.    Physical Exam Vitals reviewed.  Constitutional:      Appearance: She is well-developed. She is obese. She is not ill-appearing, toxic-appearing or diaphoretic.     Comments: Talkative.  Friendly.  HENT:     Head: Normocephalic and atraumatic.     Right Ear: Tympanic membrane, ear canal and external ear normal. No drainage, swelling or tenderness. Tympanic membrane is not injected, scarred, erythematous, retracted or bulging.     Left Ear: Tympanic membrane, ear canal and external ear normal. No drainage, swelling or tenderness. Tympanic membrane is not injected, scarred, erythematous, retracted or bulging.     Nose: No nasal deformity, septal deviation, mucosal edema or rhinorrhea.     Right Turbinates: Enlarged, swollen and pale.     Left Turbinates: Enlarged, swollen and pale.     Right Sinus: No maxillary sinus tenderness or frontal sinus tenderness.     Left Sinus: No maxillary sinus tenderness or frontal sinus tenderness.  Comments: No polyps noted.     Mouth/Throat:     Mouth: Mucous membranes are not pale and not dry.     Pharynx: Uvula midline.  Eyes:     General:        Right eye: No discharge.        Left eye: No discharge.     Conjunctiva/sclera: Conjunctivae normal.     Right eye: Right conjunctiva is not injected. No chemosis.    Left eye: Left conjunctiva is not injected. No chemosis.    Pupils: Pupils are equal, round, and reactive to light.  Cardiovascular:     Rate and Rhythm: Normal rate and regular rhythm.     Heart sounds: Normal heart sounds.  Pulmonary:     Effort: Pulmonary effort is normal. No tachypnea, accessory muscle usage or respiratory distress.     Breath sounds: Normal breath sounds. No wheezing,  rhonchi or rales.     Comments: Moving air well in all lung fields.  No increased work of breathing. Chest:     Chest wall: No tenderness.  Lymphadenopathy:     Head:     Right side of head: No submandibular, tonsillar or occipital adenopathy.     Left side of head: No submandibular, tonsillar or occipital adenopathy.     Cervical: No cervical adenopathy.  Skin:    General: Skin is warm.     Capillary Refill: Capillary refill takes less than 2 seconds.     Coloration: Skin is not pale.     Findings: No abrasion, erythema, petechiae or rash. Rash is not papular, urticarial or vesicular.  Neurological:     Mental Status: She is alert.  Psychiatric:        Behavior: Behavior is cooperative.      Diagnostic studies:    Spirometry: results normal (FEV1: 2.63/111%, FVC: 3.67/129%, FEV1/FVC: 72%).    Spirometry consistent with normal pattern.   Allergy  Studies: none      Marty Shaggy, MD  [facility]        "

## 2024-01-23 NOTE — Addendum Note (Signed)
 Addended by: IVA MARTY SALTNESS on: 01/23/2024 02:26 PM   Modules accepted: Orders

## 2024-01-25 ENCOUNTER — Other Ambulatory Visit (HOSPITAL_COMMUNITY): Payer: Self-pay

## 2024-02-05 ENCOUNTER — Telehealth: Admitting: Family Medicine

## 2024-02-05 DIAGNOSIS — J4540 Moderate persistent asthma, uncomplicated: Secondary | ICD-10-CM

## 2024-02-05 NOTE — Progress Notes (Signed)
 Debra Horn,   It looks like you have a refill on your albuterol  inhaler. Additionally- you are on a Breztri - also which you have refills on- which is a inhaled corticosteroid.   Please get the refills of these, and if these are not working you need to be seen in person.   With gratitude     NOTE: There will be NO CHARGE for this E-Visit   If you are having a true medical emergency, please call 911.

## 2024-02-06 NOTE — Progress Notes (Signed)
 Aeroallergen Immunotherapy   Ordering Provider: Marty Shaggy, MD   Patient Details  Name: Debra Horn  MRN: [id number]  Date of Birth: 1985-10-23   Order 2 of 2   Vial Label: Molds/DM   0.2 ml (Volume)  1:20 Concentration -- Alternaria alternata  0.2 ml (Volume)  1:20 Concentration -- Cladosporium herbarum  0.2 ml (Volume)  1:10 Concentration -- Aspergillus mix  0.2 ml (Volume)  1:20 Concentration -- Drechslera spicifera  0.2 ml (Volume)  1:10 Concentration -- Fusarium moniliforme  0.2 ml (Volume)  1:40 Concentration -- Aureobasidium pullulans  1.0 ml (Volume)   AU Concentration -- Mite Mix (DF 5,000 & DP 5,000)    2.2  ml Extract Subtotal  2.8  ml Diluent   5.0  ml Maintenance Total    Schedule:  B   Silver Vial (1:10,000): Schedule B (6 doses)  Green Vial (1:1,000): Schedule B (6 doses)  Blue Vial (1:100): Schedule B (6 doses)  Yellow Vial (1:10): Schedule B (6 doses)  Red Vial (1:1): Schedule A (14 doses)   Special Instructions: After completion of the first Red Vial, please space to every two weeks. After completion of the second Red Vial, please space to every 4 weeks. Ok to up dose new vials on Schedule D. Ok to come twice weekly, if desired, as long as there is 48 hours between injections.

## 2024-02-06 NOTE — Progress Notes (Signed)
 Aeroallergen Immunotherapy  Ordering Provider: Marty Shaggy, MD  Patient Details Name: Debra Horn MRN: [id number] Date of Birth: 09-17-1985  Order 1 of 2  Vial Label: G/RW/W/T/C  0.3 ml (Volume)  BAU Concentration -- 7 Grass Mix* 100,000 ([place]  Blue, [place], Orchard, Perennial Rye, RedTop, Sweet Vernal, Timothy) 0.3 ml (Volume)  1:20 Concentration -- Ragweed Mix 0.2 ml (Volume)  1:20 Concentration -- Cocklebur 0.5 ml (Volume)  1:20 Concentration -- Eastern 10 Tree Mix (also Sweet Gum) 0.2 ml (Volume)  1:10 Concentration -- Cedar, red 0.2 ml (Volume)  1:20 Concentration -- Cottonwood, Eastern* 0.2 ml (Volume)  1:10 Concentration -- Sycamore Eastern* 0.8 ml (Volume)  1:10 Concentration -- Cat Hair    2.7  ml Extract Subtotal 2.3  ml Diluent  5.0  ml Maintenance Total  Schedule:  B  Silver Vial (1:10,000): Schedule B (6 doses) Green Vial (1:1,000): Schedule B (6 doses) Blue Vial (1:100): Schedule B (6 doses) Yellow Vial (1:10): Schedule B (6 doses) Red Vial (1:1): Schedule A (14 doses)  Special Instructions: After completion of the first Red Vial, please space to every two weeks. After completion of the second Red Vial, please space to every 4 weeks. Ok to up dose new vials on Schedule D. Ok to come twice weekly, if desired, as long as there is 48 hours between injections.

## 2024-02-06 NOTE — Progress Notes (Signed)
 VIALS MADE ON 02/06/24

## 2024-02-08 ENCOUNTER — Encounter: Admitting: Family Medicine

## 2024-02-11 ENCOUNTER — Ambulatory Visit (INDEPENDENT_AMBULATORY_CARE_PROVIDER_SITE_OTHER): Admitting: Adult Health

## 2024-02-12 ENCOUNTER — Ambulatory Visit

## 2024-02-13 ENCOUNTER — Encounter: Payer: Self-pay | Admitting: Family Medicine

## 2024-02-19 ENCOUNTER — Ambulatory Visit: Admitting: Neurology

## 2024-03-24 ENCOUNTER — Ambulatory Visit: Payer: Self-pay | Admitting: Neurology

## 2024-03-26 ENCOUNTER — Encounter: Admitting: Family Medicine

## 2024-07-22 ENCOUNTER — Ambulatory Visit: Admitting: Allergy & Immunology
